# Patient Record
Sex: Male | Born: 1955 | Race: White | Hispanic: No | Smoking: Former smoker
Health system: Southern US, Community
[De-identification: ages and names within clinical notes are randomized; demographics above are authoritative.]

## PROBLEM LIST (undated history)

## (undated) DIAGNOSIS — N521 Erectile dysfunction due to diseases classified elsewhere: Secondary | ICD-10-CM

## (undated) DIAGNOSIS — I10 Essential (primary) hypertension: Secondary | ICD-10-CM

## (undated) DIAGNOSIS — C801 Malignant (primary) neoplasm, unspecified: Secondary | ICD-10-CM

## (undated) DIAGNOSIS — R310 Gross hematuria: Secondary | ICD-10-CM

## (undated) DIAGNOSIS — E1169 Type 2 diabetes mellitus with other specified complication: Secondary | ICD-10-CM

## (undated) DIAGNOSIS — E1142 Type 2 diabetes mellitus with diabetic polyneuropathy: Secondary | ICD-10-CM

## (undated) HISTORY — DX: Gross hematuria: R31.0

## (undated) HISTORY — PX: OTHER SURGICAL HISTORY: SHX169

## (undated) HISTORY — DX: Essential (primary) hypertension: I10

## (undated) HISTORY — DX: Type 2 diabetes mellitus with other specified complication: E11.69

## (undated) HISTORY — DX: Type 2 diabetes mellitus with diabetic polyneuropathy: E11.42

## (undated) HISTORY — DX: Erectile dysfunction due to diseases classified elsewhere: N52.1

---

## 2014-11-28 ENCOUNTER — Telehealth: Payer: Self-pay | Admitting: Family Medicine

## 2014-11-28 DIAGNOSIS — N521 Erectile dysfunction due to diseases classified elsewhere: Principal | ICD-10-CM

## 2014-11-28 DIAGNOSIS — E1169 Type 2 diabetes mellitus with other specified complication: Secondary | ICD-10-CM

## 2014-11-28 MED ORDER — SILDENAFIL CITRATE 20 MG PO TABS: 20.0000 mg | ORAL_TABLET | ORAL | Status: DC | PRN

## 2014-11-28 MED ORDER — LISINOPRIL 5 MG PO TABS: 5.0000 mg | ORAL_TABLET | Freq: Every day | ORAL | Status: DC

## 2014-11-28 NOTE — Telephone Encounter (Signed)
Pt called with update and a couple of questions.  He asked for your to call went you have a chance [phone]

## 2014-11-28 NOTE — Telephone Encounter (Signed)
Pt had question about his blood sugars. They have been running below 120 for the past few weeks. He has been taking only 4 units of lantus everyday. This AM he was in the 80's- no symptoms of hypoglycemia. He was also concerned about some low blood pressure readings- < 100/60.  He has been cutting it 1/2 - advised him to continue his 5mg  dose.  Call if his BP is less than 100/60. Call if BG is less than < 100 consistently.   Pt also wondering if he could have a prescription for viagra for his erectile dysfunction.

## 2015-06-11 ENCOUNTER — Encounter: Payer: Self-pay | Admitting: Family Medicine

## 2015-06-11 ENCOUNTER — Ambulatory Visit (INDEPENDENT_AMBULATORY_CARE_PROVIDER_SITE_OTHER): Payer: Self-pay | Admitting: Family Medicine

## 2015-06-11 VITALS — BP 119/84 | HR 90 | Temp 97.6°F | Resp 16 | Ht 75.0 in | Wt 257.8 lb

## 2015-06-11 DIAGNOSIS — I10 Essential (primary) hypertension: Secondary | ICD-10-CM

## 2015-06-11 DIAGNOSIS — E1142 Type 2 diabetes mellitus with diabetic polyneuropathy: Secondary | ICD-10-CM | POA: Insufficient documentation

## 2015-06-11 DIAGNOSIS — L089 Local infection of the skin and subcutaneous tissue, unspecified: Secondary | ICD-10-CM

## 2015-06-11 DIAGNOSIS — L723 Sebaceous cyst: Secondary | ICD-10-CM

## 2015-06-11 DIAGNOSIS — E119 Type 2 diabetes mellitus without complications: Secondary | ICD-10-CM | POA: Insufficient documentation

## 2015-06-11 HISTORY — DX: Essential (primary) hypertension: I10

## 2015-06-11 HISTORY — DX: Type 2 diabetes mellitus with diabetic polyneuropathy: E11.42

## 2015-06-11 LAB — POCT GLYCOSYLATED HEMOGLOBIN (HGB A1C): Hemoglobin A1C: 7.2

## 2015-06-11 MED ORDER — METFORMIN HCL 1000 MG PO TABS: 1000.0000 mg | ORAL_TABLET | Freq: Two times a day (BID) | ORAL | Status: DC

## 2015-06-11 MED ORDER — CEPHALEXIN 500 MG PO CAPS: 500.0000 mg | ORAL_CAPSULE | Freq: Two times a day (BID) | ORAL | Status: DC

## 2015-06-11 NOTE — Assessment & Plan Note (Addendum)
Great improvement in A1c. Increase metformin to 1000mg  BID for better control. ACE for renal protection. Foot exam done. Educated pt on need for eye exam- decline referral. RTC 3 mos.

## 2015-06-11 NOTE — Assessment & Plan Note (Signed)
Controlled. Continue current regimen. Check CMET.  ACE for renal protection.

## 2015-06-11 NOTE — Patient Instructions (Signed)
Please check your sugar 2-3 times per week. Scatter the times you take it- such as morning, 2 hours a meal, and bedtime.   Keep doing what you are doing. Your diabetes is under great control!   Take Keflex twice daily for the cyst on your back. Please consider having a surgeon look at your cyst.

## 2015-06-11 NOTE — Progress Notes (Signed)
Subjective:    [name], male    DOB: Aug 17, 1955, 60 y.o.   MRN: [id number]  HPI: Richard Oconnell is a 60 y.o. male presenting on 06/11/2015 for Hypertension   HPI  Pt presents for HTN and diabetes follow-up.  Last A1c was 13%- has lost significant weight. AM sugars 120-130.  Low of 98. Checks blood glucose in the AM. Eliminated sugar sweetened beverages. Switched to sweetener.  High protein, low carb diet. Lots of veggie. Whole wheat bread rarely. Exercise.   Overall doing well at home.  Pt has large sebaceous cyst on his back. It occasionally leaks and oily substance. Tender to palpation.   Past Medical History  Diagnosis Date  . Hypertension   . Diabetes mellitus without complication [facility])     Current Outpatient Prescriptions on File Prior to Visit  Medication Sig  . lisinopril (PRINIVIL,ZESTRIL) 5 MG tablet Take 1 tablet (5 mg total) by mouth daily.  . sildenafil (REVATIO) 20 MG tablet Take 1 tablet (20 mg total) by mouth as needed. Take 1-5 pills as needed for erectile dysfunction.   No current facility-administered medications on file prior to visit.    Review of Systems  Constitutional: Negative for fever and chills.  HENT: Negative.   Respiratory: Negative for chest tightness, shortness of breath and wheezing.   Cardiovascular: Negative for chest pain, palpitations and leg swelling.  Gastrointestinal: Negative for nausea, vomiting and abdominal pain.  Endocrine: Negative.   Genitourinary: Negative for dysuria, urgency, discharge, penile pain and testicular pain.  Musculoskeletal: Negative for back pain, joint swelling and arthralgias.  Skin: Positive for rash.  Neurological: Negative for dizziness, weakness, numbness and headaches.  Psychiatric/Behavioral: Negative for sleep disturbance and dysphoric mood.   Per HPI unless specifically indicated above     Objective:    BP 119/84 mmHg  Pulse 90  Temp(Src) 97.6 F (36.4 C) (Oral)  Resp 16  Ht 6'  3" (1.905 m)  Wt 257 lb 12.8 oz (116.937 kg)  BMI 32.22 kg/m2  Wt Readings from Last 3 Encounters:  06/11/15 257 lb 12.8 oz (116.937 kg)    Physical Exam  Constitutional: He is oriented to person, place, and time. He appears well-developed and well-nourished. No distress.  HENT:  Head: Normocephalic and atraumatic.  Neck: Neck supple. No thyromegaly present.  Cardiovascular: Normal rate, regular rhythm and normal heart sounds.  Exam reveals no gallop and no friction rub.   No murmur heard. Pulmonary/Chest: Effort normal and breath sounds normal. He has no wheezes.  Abdominal: Soft. Bowel sounds are normal. He exhibits no distension. There is no tenderness. There is no rebound.  Musculoskeletal: Normal range of motion. He exhibits no edema or tenderness.  Neurological: He is alert and oriented to person, place, and time. He has normal reflexes.  Skin: Skin is warm and dry. No rash noted. No erythema.  5cm in diameter sebaceous cyst in the upper back to the L side.   Psychiatric: He has a normal mood and affect. His behavior is normal. Thought content normal.   Results for orders placed or performed in visit on 06/11/15  POCT HgB A1C  Result Value Ref Range   Hemoglobin A1C 7.2       Assessment & Plan:   Problem List Items Addressed This Visit      Cardiovascular and Mediastinum   Hypertension    Controlled. Continue current regimen. Check CMET.  ACE for renal protection.       Relevant Orders  Comprehensive Metabolic Panel (CMET)     Endocrine   Type 2 diabetes mellitus with diabetic polyneuropathy, without long-term current use of insulin (HCC) - Primary    Great improvement in A1c. Increase metformin to 1000mg  BID for better control. ACE for renal protection. Foot exam done. Educated pt on need for eye exam- decline referral. RTC 3 mos.       Relevant Medications   metFORMIN (GLUCOPHAGE) 1000 MG tablet   Other Relevant Orders   POCT HgB A1C (Completed)   Lipid  Profile    Other Visit Diagnoses    Infected sebaceous cyst        Pt would benefit from I&D- declined in office today. Due to size- he would like a surgical consult for removal. Keflex to see if we can improve inflammation.     Relevant Medications    cephALEXin (KEFLEX) 500 MG capsule       Meds ordered this encounter  Medications  . DISCONTD: metFORMIN (GLUCOPHAGE) 500 MG tablet    Sig: Take 500 mg by mouth 2 (two) times daily with a meal.  . metFORMIN (GLUCOPHAGE) 1000 MG tablet    Sig: Take 1 tablet (1,000 mg total) by mouth 2 (two) times daily with a meal.    Dispense:  180 tablet    Refill:  3    Order Specific Question:  Supervising Provider    Answer:  Arlis Porta [phone]  . cephALEXin (KEFLEX) 500 MG capsule    Sig: Take 1 capsule (500 mg total) by mouth 2 (two) times daily.    Dispense:  14 capsule    Refill:  0    Order Specific Question:  Supervising Provider    Answer:  Arlis Porta [id number]      Follow up plan: Return in about 3 months (around 09/09/2015) for diabetes. Marland Kitchen

## 2016-05-06 ENCOUNTER — Telehealth: Payer: Self-pay | Admitting: Family Medicine

## 2016-05-06 MED ORDER — LISINOPRIL 5 MG PO TABS: 5.0000 mg | ORAL_TABLET | Freq: Every day | ORAL | 0 refills | Status: DC

## 2016-05-06 NOTE — Telephone Encounter (Signed)
Pt called requesting  A refill on lisinopil, once  Medication is  called in he have requested a phone call.  [phone]

## 2016-05-26 ENCOUNTER — Encounter: Payer: Self-pay | Admitting: Family Medicine

## 2016-05-26 ENCOUNTER — Ambulatory Visit (INDEPENDENT_AMBULATORY_CARE_PROVIDER_SITE_OTHER): Payer: Self-pay | Admitting: Family Medicine

## 2016-05-26 VITALS — BP 144/88 | HR 72 | Temp 98.1°F | Resp 16 | Ht 75.0 in | Wt 241.6 lb

## 2016-05-26 DIAGNOSIS — N521 Erectile dysfunction due to diseases classified elsewhere: Secondary | ICD-10-CM

## 2016-05-26 DIAGNOSIS — Z13220 Encounter for screening for lipoid disorders: Secondary | ICD-10-CM

## 2016-05-26 DIAGNOSIS — N529 Male erectile dysfunction, unspecified: Secondary | ICD-10-CM | POA: Insufficient documentation

## 2016-05-26 DIAGNOSIS — E1169 Type 2 diabetes mellitus with other specified complication: Secondary | ICD-10-CM

## 2016-05-26 DIAGNOSIS — I10 Essential (primary) hypertension: Secondary | ICD-10-CM

## 2016-05-26 DIAGNOSIS — E1142 Type 2 diabetes mellitus with diabetic polyneuropathy: Secondary | ICD-10-CM

## 2016-05-26 HISTORY — DX: Type 2 diabetes mellitus with other specified complication: E11.69

## 2016-05-26 LAB — POCT GLYCOSYLATED HEMOGLOBIN (HGB A1C): Hemoglobin A1C: 6.3

## 2016-05-26 MED ORDER — SILDENAFIL CITRATE 20 MG PO TABS: 20.0000 mg | ORAL_TABLET | ORAL | 2 refills | Status: DC | PRN

## 2016-05-26 MED ORDER — LISINOPRIL 10 MG PO TABS: 10.0000 mg | ORAL_TABLET | Freq: Every day | ORAL | 3 refills | Status: DC

## 2016-05-26 MED ORDER — METFORMIN HCL 1000 MG PO TABS: 1000.0000 mg | ORAL_TABLET | Freq: Two times a day (BID) | ORAL | 3 refills | Status: DC

## 2016-05-26 NOTE — Patient Instructions (Signed)
Thank you for coming in to clinic today.  1. Keep up the good work, A1c down from 7.2 to 6.3 - For now continue Metformin 1000mg  twice a day, in future we can try reducing this - If you continue to lose weight and work on diet and exercise regularly this will certainly help sugar and blood pressure  2. For BP - Check at home with your cuff, write down your readings, check this as needed not everyday, but different times of day, bring your printed record and also you can bring the BP cuff to calibrate it if you want - Double up on Lisinopril 5mg  x 2 daily for now, and then take new rx Lisinopril 10mg  daily, in future we may need to increase this dose as well if needed  If BP is elevated >140/90 persistently despite this change, notify our office and we can adjust medications sooner if needed  You will be due for FASTING BLOOD WORK (no food or drink after midnight before, only water or coffee without cream/sugar on the morning of)  - Please go ahead and schedule a "Lab Only" visit in the morning at the clinic for lab draw in 6 months - Make sure Lab Only appointment is at least 1-2 weeks before your next appointment, so that results will be available  For Lab Results, once available within 2-3 days of blood draw, you can can log in to MyChart online to view your results and a brief explanation. Also, we can discuss results at next follow-up visit.  Please schedule a follow-up appointment with Dr. Parks Ranger in 6 months for Annual Physical  If you have any other questions or concerns, please feel free to call the clinic or send a message through [facility]. You may also schedule an earlier appointment if necessary.  Nobie Putnam, DO [place]

## 2016-05-26 NOTE — Assessment & Plan Note (Signed)
Consistent with multifactorial ED with age, DM, HTN  Plan: 1. Refilled Sildenafil 20mg  tabs, taking 3-4 tabs PRN, given additional refills, printed, to take to [facility] 2. Follow-up as needed

## 2016-05-26 NOTE — Assessment & Plan Note (Addendum)
Well controlled, A1c 6.3 (last 7.2), has been well controlled >1 year now. Remains off insulin, only on Metformin and lifestyle changes  Plan: 1. Continue Metformin 1000mg  BID for now, given some recent limited lifestyle modifications and currently less exercise, reviewed future plan to taper down likely 500 BID in future if still remains A1c < 7 in 6 months, otherwise can consider Metformin XR once daily in future can possibly DC it 2. Improve regular exercise, DM diet, has goal for limited artificial sweetened soda 3. Continue ACEi 4. Not on ASA or Statin, awaiting future labs, briefly discussed ASCVD risk but patient hesitant about any new medications and does not like taking meds in general 5. Follow-up 6 months fasting labs, A1c, Annual physical

## 2016-05-26 NOTE — Progress Notes (Signed)
Subjective:    [name], male    DOB: 11/15/1955, 61 y.o.   MRN: [id number]  Richard Oconnell is a 61 y.o. male presenting on 05/26/2016 for Diabetes (highest 183 and lowest 120 )   HPI   CHRONIC DM, Type 2: Reports no concerns, anticipating he has been doing better, with weight loss over past year. Brief background information with new dx DM in May 2014, he had early A1c >13 at that time, now has done well, last A1c 7.2. CBGs: Avg 110-130, Low 82 (asymptomatic), High 183 (in past 4 months). Checks CBGs fasting in AM once every 1-2 weeks Meds: Metformin 1000mg  BID (increased to BID back about 2 year ago). No longer on lantus insulin this was only at very initial diagnosis with A1c >13 Reports mostly good compliance, occasionally will miss an evening dose. Tolerating well w/o side-effects Currently on ACEi Lifestyle: Diet (admits tries to follow DM diet with low carb, he has been off of sweet tea for few years now, limiting artifical sweetened soda, limited cereal ) / Exercise (recently tapered off exercise, but previously more sit ups, often sedentary at computer, no more running / jogging due to knees) - Weight loss about 16 lbs in 1 year with intentional weight loss, goal of 200 lb - DM neuropathy bilateral feet, some burning tingling, stable without progression, never on gabapentin, not interested Denies hypoglycemia, polyuria, visual changes, numbness or tingling.  CHRONIC HTN: Reports checks BP at home, avg 130s/80s, recently some high 130s-140. Usually higher at doctors office. Current Meds - Lisinopril 5mg  Reports good compliance, took med today. Tolerating well, w/o complaints. Denies CP, dyspnea, HA, edema, dizziness / lightheadedness  Erectile Dysfunction: - Reports chronic history of ED, he attributes some of it to the lisinopril, and does not like taking medications. However, does have DM and HTN. - Currently doing well on generic sildenafil, usually takes 3-4  tablets per use with good results. Request refill today - Admits to good sexual desire. Does get nocturnal erections.   Social History  Substance Use Topics  . Smoking status: Former Smoker    Types: Cigars  . Smokeless tobacco: Former Systems developer     Comment: once a while might have cigar may be once in 10 year  . Alcohol use No    Review of Systems Per HPI unless specifically indicated above     Objective:    BP (!) 144/88 (BP Location: Left Arm, Cuff Size: Normal)   Pulse 72   Temp 98.1 F (36.7 C) (Oral)   Resp 16   Ht 6\' 3"  (1.905 m)   Wt 241 lb 9.6 oz (109.6 kg)   BMI 30.20 kg/m   Wt Readings from Last 3 Encounters:  05/26/16 241 lb 9.6 oz (109.6 kg)  06/11/15 257 lb 12.8 oz (116.9 kg)    Physical Exam  Constitutional: He is oriented to person, place, and time. He appears well-developed and well-nourished. No distress.  Well-appearing, comfortable, cooperative  HENT:  Head: Normocephalic and atraumatic.  Mouth/Throat: Oropharynx is clear and moist.  Neck: Normal range of motion. Neck supple. No thyromegaly present.  No carotid bruits  Cardiovascular: Normal rate, regular rhythm, normal heart sounds and intact distal pulses.   No murmur heard. Pulmonary/Chest: Effort normal and breath sounds normal. No respiratory distress. He has no wheezes. He has no rales.  Musculoskeletal: He exhibits no edema.  Lymphadenopathy:    He has no cervical adenopathy.  Neurological: He is alert and oriented  to person, place, and time.  Skin: Skin is warm and dry. No rash noted. He is not diaphoretic.  Psychiatric: His behavior is normal.  Nursing note and vitals reviewed.    Results for orders placed or performed in visit on 05/26/16  POCT HgB A1C  Result Value Ref Range   Hemoglobin A1C 6.3       Assessment & Plan:   Problem List Items Addressed This Visit    Type 2 diabetes mellitus with diabetic polyneuropathy, without long-term current use of insulin ([place]) - Primary     Well controlled, A1c 6.3 (last 7.2), has been well controlled >1 year now. Remains off insulin, only on Metformin and lifestyle changes  Plan: 1. Continue Metformin 1000mg  BID for now, given some recent limited lifestyle modifications and currently less exercise, reviewed future plan to taper down likely 500 BID in future if still remains A1c < 7 in 6 months, otherwise can consider Metformin XR once daily in future can possibly DC it 2. Improve regular exercise, DM diet, has goal for limited artificial sweetened soda 3. Continue ACEi 4. Not on ASA or Statin, awaiting future labs, briefly discussed ASCVD risk but patient hesitant about any new medications and does not like taking meds in general 5. Follow-up 6 months fasting labs, A1c, Annual physical      Relevant Medications   metFORMIN (GLUCOPHAGE) 1000 MG tablet   lisinopril (PRINIVIL,ZESTRIL) 10 MG tablet   Other Relevant Orders   POCT HgB A1C (Completed)   Lipid panel   Hemoglobin A1c   Hypertension    Mildly elevated BP above goal, still on manual re-check. Patient admits some white coat hypertension, however home readings seem to be elevated as well by report.  Plan: 1. Increase Lisinopril to 10mg  daily, anticipate may need higher dose in future, initially only rx for renal protection 2. Encouraged to resume regular exercise, continue diet 3. Check BP outside office and bring readings to next visit 4. Follow-up 6 months, sooner if needed elevated BP, future fasting labs 6 mo      Relevant Medications   lisinopril (PRINIVIL,ZESTRIL) 10 MG tablet   sildenafil (REVATIO) 20 MG tablet   Other Relevant Orders   COMPLETE METABOLIC PANEL WITH GFR   Erectile dysfunction associated with type 2 diabetes mellitus ([place])    Consistent with multifactorial ED with age, DM, HTN  Plan: 1. Refilled Sildenafil 20mg  tabs, taking 3-4 tabs PRN, given additional refills, printed, to take to [facility] 2. Follow-up as needed       Relevant Medications   metFORMIN (GLUCOPHAGE) 1000 MG tablet   lisinopril (PRINIVIL,ZESTRIL) 10 MG tablet   sildenafil (REVATIO) 20 MG tablet    Other Visit Diagnoses    Screening cholesterol level       Relevant Orders   Lipid panel      Meds ordered this encounter  Medications  . metFORMIN (GLUCOPHAGE) 1000 MG tablet    Sig: Take 1 tablet (1,000 mg total) by mouth 2 (two) times daily with a meal.    Dispense:  180 tablet    Refill:  3  . lisinopril (PRINIVIL,ZESTRIL) 10 MG tablet    Sig: Take 1 tablet (10 mg total) by mouth daily.    Dispense:  90 tablet    Refill:  3  . sildenafil (REVATIO) 20 MG tablet    Sig: Take 1 tablet (20 mg total) by mouth as needed. Take 1-5 pills as needed for erectile dysfunction.    Dispense:  30 tablet    Refill:  2      Follow up plan: Return in about 6 months (around 11/23/2016) for blood pressure, diabetes.  Nobie Putnam, [facility] 05/26/2016, 10:52 PM

## 2016-05-26 NOTE — Assessment & Plan Note (Signed)
Mildly elevated BP above goal, still on manual re-check. Patient admits some white coat hypertension, however home readings seem to be elevated as well by report.  Plan: 1. Increase Lisinopril to 10mg  daily, anticipate may need higher dose in future, initially only rx for renal protection 2. Encouraged to resume regular exercise, continue diet 3. Check BP outside office and bring readings to next visit 4. Follow-up 6 months, sooner if needed elevated BP, future fasting labs 6 mo

## 2016-11-16 ENCOUNTER — Encounter: Payer: Self-pay | Admitting: Family Medicine

## 2016-11-16 ENCOUNTER — Ambulatory Visit (INDEPENDENT_AMBULATORY_CARE_PROVIDER_SITE_OTHER): Payer: Self-pay | Admitting: Family Medicine

## 2016-11-16 VITALS — BP 132/86 | HR 79 | Temp 98.3°F | Resp 16 | Ht 75.0 in | Wt 255.0 lb

## 2016-11-16 DIAGNOSIS — N3001 Acute cystitis with hematuria: Secondary | ICD-10-CM

## 2016-11-16 DIAGNOSIS — R300 Dysuria: Secondary | ICD-10-CM

## 2016-11-16 DIAGNOSIS — R310 Gross hematuria: Secondary | ICD-10-CM

## 2016-11-16 HISTORY — DX: Gross hematuria: R31.0

## 2016-11-16 LAB — POCT URINALYSIS DIPSTICK
Bilirubin, UA: NEGATIVE
Glucose, UA: NEGATIVE
Ketones, UA: NEGATIVE
Nitrite, UA: NEGATIVE
Protein, UA: 300
Spec Grav, UA: 1.015 (ref 1.010–1.025)
Urobilinogen, UA: 0.2 E.U./dL
pH, UA: 5 (ref 5.0–8.0)

## 2016-11-16 MED ORDER — CEPHALEXIN 500 MG PO CAPS: 500.0000 mg | ORAL_CAPSULE | Freq: Three times a day (TID) | ORAL | 0 refills | Status: DC

## 2016-11-16 NOTE — Patient Instructions (Addendum)
Thank you for coming to the clinic today.  1. 1. You have a Urinary Tract Infection - this is very common, your symptoms are reassuring and you should get better within 1 week on the antibiotics - Start Keflex 500mg  3 times daily for next 7 days, complete entire course, even if feeling better - We sent urine for a culture, we will call you within next few days if we need to change antibiotics - Please drink plenty of fluids, improve hydration over next 1 week  If symptoms worsening, developing nausea / vomiting, worsening back pain, fevers / chills / sweats, then please return for re-evaluation sooner.  To prevent bladder and kidney infections...  Drink enough water to keep your pee clear to pale yellow   Referral placed to Urology - stay tuned for appointment, if not heard back within 2 weeks, call them to check status, notify them about self pay and options  [facility] -[address],  [place]  [address] Phone: [phone]  Please schedule a Follow-up Appointment to: Return for as scheduled for physical.  If you have any other questions or concerns, please feel free to call the clinic or send a message through [place]. You may also schedule an earlier appointment if necessary.  Additionally, you may be receiving a survey about your experience at our clinic within a few days to 1 week by e-mail or mail. We value your feedback.  Nobie Putnam, DO [place]

## 2016-11-16 NOTE — Progress Notes (Signed)
Subjective:    [name], male    DOB: 05-Mar-1956, 61 y.o.   MRN: [id number]  Richard Oconnell is a 61 y.o. male presenting on 11/16/2016 for Hematuria (onset 2 week notice blood clot no pain no fever)  Patient presents for a same day appointment.  HPI   GROSS HEMATURIA: - Reports symptoms started 1-2 weeks ago onset urinary discomfort had pain vs burning and then felt like "blockage" and he passed a blood clot in urine. Has had some repeat episodes of blood passage in urine since then, describes continued dysuria or burning with voiding. - Admits to prior history of blood in urine 2-3 years ago, had x 2 episodes with color change in urine with "pink urine" without blood clot, this spontaneously resolve in past without treatment. He has never seen Urologist. - No prior history of kidney stone - History of ED was given prior rx sildenafil but has not tried using this since prescribed - Chart listed prior history of "former smoker" but he only smoked "one cigar in lifetime", never cigarettes - Denies any fever/chills, sweats, nausea, vomiting, abdominal pain, back or flank pain  Social History  Substance Use Topics  . Smoking status: Never Smoker  . Smokeless tobacco: Former Systems developer     Comment: once a while had one cigar >10 years ago  . Alcohol use No    Review of Systems Per HPI unless specifically indicated above     Objective:    BP 132/86   Pulse 79   Temp 98.3 F (36.8 C) (Oral)   Resp 16   Ht 6\' 3"  (1.905 m)   Wt 255 lb (115.7 kg)   BMI 31.87 kg/m   Wt Readings from Last 3 Encounters:  11/16/16 255 lb (115.7 kg)  05/26/16 241 lb 9.6 oz (109.6 kg)  06/11/15 257 lb 12.8 oz (116.9 kg)    Physical Exam  Constitutional: He is oriented to person, place, and time. He appears well-developed and well-nourished. No distress.  Well-appearing, comfortable, cooperative  HENT:  Head: Normocephalic and atraumatic.  Cardiovascular: Normal rate.   Pulmonary/Chest:  Effort normal.  Abdominal: Soft. He exhibits no distension. There is no tenderness.  Musculoskeletal: He exhibits no edema.  No back or flank tenderness on exam. Normal range of motion.  Neurological: He is alert and oriented to person, place, and time.  Skin: Skin is warm and dry. No rash noted. He is not diaphoretic. No erythema.  Nursing note and vitals reviewed.  Results for orders placed or performed in visit on 11/16/16  POCT Urinalysis Dipstick  Result Value Ref Range   Color, UA dark amber    Clarity, UA cloudy    Glucose, UA negative    Bilirubin, UA negative    Ketones, UA negative    Spec Grav, UA 1.015 1.010 - 1.025   Blood, UA large    pH, UA 5.0 5.0 - 8.0   Protein, UA 300    Urobilinogen, UA 0.2 0.2 or 1.0 E.U./dL   Nitrite, UA negative    Leukocytes, UA Trace (A) Negative      Assessment & Plan:   Problem List Items Addressed This Visit    Gross hematuria - Primary  Clinically with gross hematuria and blood clots also with prior history of similar episodes less severe 2-3 years ago, cannot rule out potential malignancy (kidney, bladder) however lower risk since never smoker. Most likely is UTI source, see below  Plan: 1. Treat empirically with  antibiotics for UTI and pending urine culture 2. Follow-up urine culture results 3. Referral to [facility] for evaluation of gross hematuria - discussion today that he would likely need further work-up with imaging, CT hematuria protocol, and cystoscopy given evidence of clots in urine and prior similar episodes. He understands risks, and despite self pay will contact [facility]    Relevant Orders   POCT Urinalysis Dipstick (Completed)   Ambulatory referral to Urology   Urine Culture    Other Visit Diagnoses    Dysuria       Acute cystitis with hematuria      Suspected UTI as cause of hematuria and dysuria Known DM2. No concern for pyelo today (no systemic symptoms, neg fever, back  pain, n/v).  Plan: 1. Start empiric Keflex 500mg  TID x 7 days 2. Check urine culture 3. Improve PO hydration 4. RTC if no improvement 1-2 weeks, red flags given to return sooner  Referral to urology    Relevant Medications   cephALEXin (KEFLEX) 500 MG capsule      Meds ordered this encounter  Medications  . cephALEXin (KEFLEX) 500 MG capsule    Sig: Take 1 capsule (500 mg total) by mouth 3 (three) times daily. For 7 days    Dispense:  21 capsule    Refill:  0      Follow up plan: Return for as scheduled for physical.  Nobie Putnam, DO [facility] 11/16/2016, 3:59 PM

## 2016-11-18 LAB — URINE CULTURE: Organism ID, Bacteria: NO GROWTH

## 2016-11-23 ENCOUNTER — Emergency Department
Admission: EM | Admit: 2016-11-23 | Discharge: 2016-11-23 | Disposition: A | Discharge status: Home / Self Care | Payer: Self-pay | Attending: Emergency Medicine | Admitting: Emergency Medicine

## 2016-11-23 ENCOUNTER — Emergency Department: Payer: Self-pay

## 2016-11-23 ENCOUNTER — Encounter: Payer: Self-pay | Admitting: Emergency Medicine

## 2016-11-23 DIAGNOSIS — C679 Malignant neoplasm of bladder, unspecified: Secondary | ICD-10-CM | POA: Insufficient documentation

## 2016-11-23 DIAGNOSIS — I10 Essential (primary) hypertension: Secondary | ICD-10-CM | POA: Insufficient documentation

## 2016-11-23 DIAGNOSIS — Z79899 Other long term (current) drug therapy: Secondary | ICD-10-CM | POA: Insufficient documentation

## 2016-11-23 DIAGNOSIS — Z7984 Long term (current) use of oral hypoglycemic drugs: Secondary | ICD-10-CM | POA: Insufficient documentation

## 2016-11-23 DIAGNOSIS — E119 Type 2 diabetes mellitus without complications: Secondary | ICD-10-CM | POA: Insufficient documentation

## 2016-11-23 DIAGNOSIS — R310 Gross hematuria: Secondary | ICD-10-CM | POA: Insufficient documentation

## 2016-11-23 DIAGNOSIS — Z87891 Personal history of nicotine dependence: Secondary | ICD-10-CM | POA: Insufficient documentation

## 2016-11-23 LAB — URINALYSIS, COMPLETE (UACMP) WITH MICROSCOPIC
Specific Gravity, Urine: 1.021 (ref 1.005–1.030)
Squamous Epithelial / LPF: NONE SEEN

## 2016-11-23 LAB — CBC
HCT: 42.4 % (ref 40.0–52.0)
Hemoglobin: 14.3 g/dL (ref 13.0–18.0)
MCH: 29.6 pg (ref 26.0–34.0)
MCHC: 33.7 g/dL (ref 32.0–36.0)
MCV: 87.8 fL (ref 80.0–100.0)
Platelets: 300 10*3/uL (ref 150–440)
RBC: 4.83 MIL/uL (ref 4.40–5.90)
RDW: 13.7 % (ref 11.5–14.5)
WBC: 8.9 10*3/uL (ref 3.8–10.6)

## 2016-11-23 LAB — BASIC METABOLIC PANEL
Anion gap: 8 (ref 5–15)
BUN: 22 mg/dL — ABNORMAL HIGH (ref 6–20)
CO2: 23 mmol/L (ref 22–32)
Calcium: 9.2 mg/dL (ref 8.9–10.3)
Chloride: 105 mmol/L (ref 101–111)
Creatinine, Ser: 0.95 mg/dL (ref 0.61–1.24)
GFR calc Af Amer: >60 mL/min (ref >60)
GFR calc non Af Amer: >60 mL/min (ref >60)
Glucose, Bld: 143 mg/dL — ABNORMAL HIGH (ref 65–99)
Potassium: 4.2 mmol/L (ref 3.5–5.1)
Sodium: 136 mmol/L (ref 135–145)

## 2016-11-23 NOTE — ED Triage Notes (Signed)
Patient to ER for c/o hematuria with blood clots. Patient states he believes he has had kidney stone, has had severe pain to penis (approx 2 inches from tip of penis). States he has passed several blood clots and had hematuria. Denies any history of kidney stones. States now urine stream is decreased as well, but still able to pass urine.

## 2016-11-23 NOTE — ED Notes (Signed)
Patient transported to CT 

## 2016-11-23 NOTE — Discharge Instructions (Addendum)
Please keep your appointment with urology in 2 days as scheduled or return to the emergency department sooner if you develop a fever chills or if you are unable to urinate.  It was a pleasure to take care of you today, and thank you for coming to our emergency department.  If you have any questions or concerns before leaving please ask the nurse to grab me and I'm more than happy to go through your aftercare instructions again.  If you were prescribed any opioid pain medication today such as Norco, Vicodin, Percocet, morphine, hydrocodone, or oxycodone please make sure you do not drive when you are taking this medication as it can alter your ability to drive safely.  If you have any concerns once you are home that you are not improving or are in fact getting worse before you can make it to your follow-up appointment, please do not hesitate to call 911 and come back for further evaluation.  Darel Hong MD  Results for orders placed or performed during the hospital encounter of 11/23/16  Urinalysis, Complete w Microscopic  Result Value Ref Range   Color, Urine RED (A) YELLOW   APPearance CLOUDY (A) CLEAR   Specific Gravity, Urine 1.021 1.005 - 1.030   pH  5.0 - 8.0    TEST NOT REPORTED DUE TO COLOR INTERFERENCE OF URINE PIGMENT   Glucose, UA (A) NEGATIVE mg/dL    TEST NOT REPORTED DUE TO COLOR INTERFERENCE OF URINE PIGMENT   Hgb urine dipstick (A) NEGATIVE    TEST NOT REPORTED DUE TO COLOR INTERFERENCE OF URINE PIGMENT   Bilirubin Urine (A) NEGATIVE    TEST NOT REPORTED DUE TO COLOR INTERFERENCE OF URINE PIGMENT   Ketones, ur (A) NEGATIVE mg/dL    TEST NOT REPORTED DUE TO COLOR INTERFERENCE OF URINE PIGMENT   Protein, ur (A) NEGATIVE mg/dL    TEST NOT REPORTED DUE TO COLOR INTERFERENCE OF URINE PIGMENT   Nitrite (A) NEGATIVE    TEST NOT REPORTED DUE TO COLOR INTERFERENCE OF URINE PIGMENT   Leukocytes, UA (A) NEGATIVE    TEST NOT REPORTED DUE TO COLOR INTERFERENCE OF URINE PIGMENT   RBC / HPF TOO NUMEROUS TO COUNT 0 - 5 RBC/hpf   WBC, UA 6-30 0 - 5 WBC/hpf   Bacteria, UA MANY (A) NONE SEEN   Squamous Epithelial / LPF NONE SEEN NONE SEEN   Mucous PRESENT    Amorphous Crystal PRESENT   Basic metabolic panel  Result Value Ref Range   Sodium 136 135 - 145 mmol/L   Potassium 4.2 3.5 - 5.1 mmol/L   Chloride 105 101 - 111 mmol/L   CO2 23 22 - 32 mmol/L   Glucose, Bld 143 (H) 65 - 99 mg/dL   BUN 22 (H) 6 - 20 mg/dL   Creatinine, Ser 0.95 0.61 - 1.24 mg/dL   Calcium 9.2 8.9 - 10.3 mg/dL   GFR calc non Af Amer >60 >60 mL/min   GFR calc Af Amer >60 >60 mL/min   Anion gap 8 5 - 15  CBC  Result Value Ref Range   WBC 8.9 3.8 - 10.6 K/uL   RBC 4.83 4.40 - 5.90 MIL/uL   Hemoglobin 14.3 13.0 - 18.0 g/dL   HCT 42.4 40.0 - 52.0 %   MCV 87.8 80.0 - 100.0 fL   MCH 29.6 26.0 - 34.0 pg   MCHC 33.7 32.0 - 36.0 g/dL   RDW 13.7 11.5 - 14.5 %   Platelets 300 150 -  440 K/uL   Ct Renal Stone Study  Result Date: 11/23/2016 CLINICAL DATA:  61 y/o  M; flank pain and hematuria. EXAM: CT ABDOMEN AND PELVIS WITHOUT CONTRAST TECHNIQUE: Multidetector CT imaging of the abdomen and pelvis was performed following the standard protocol without IV contrast. COMPARISON:  None. FINDINGS: Lower chest: No acute abnormality. Hepatobiliary: Subcentimeter lucency in segment 5 (series 2, image 39), likely a cyst. Cholelithiasis. No intra or extrahepatic biliary ductal dilatation. Pancreas: Unremarkable. No pancreatic ductal dilatation or surrounding inflammatory changes. Spleen: Normal in size without focal abnormality. Adrenals/Urinary Tract: Normal adrenal glands. 13 mm left kidney cyst (series 5, image 99). Mild nonspecific perinephric stranding. No urinary stone disease or hydronephrosis identified. High attenuation debris within the left hemi bladder, left lateral bladder diverticulum, and areas of soft tissue attenuating wall thickening along the left lateral bladder wall and posterior bladder wall  (series 2, image 76 and 80). Stomach/Bowel: Stomach is within normal limits. Appendix appears normal. No evidence of bowel wall thickening, distention, or inflammatory changes. Extensive sigmoid diverticulosis. Vascular/Lymphatic: Aortic atherosclerosis with moderate calcification. No enlarged abdominal or pelvic lymph nodes. Reproductive: Prostate hypertrophy. Other: Small inguinal hernias containing fat. Musculoskeletal: Multilevel degenerative changes of the thoracic and lumbar spine. No acute osseous abnormality identified. IMPRESSION: 1. Suboptimal assessment of soft tissues in the absence of intravenous contrast. 2. High attenuation material within the gallbladder likely representing hemorrhage. 3. Bladder wall thickening along the lateral and posterior walls and within a left bladder diverticulum may represent cystitis or urothelial neoplasm. Direct visualization is recommended. 4. No hydronephrosis or urinary stone disease identified. 5. Cholelithiasis. 6. Aortic atherosclerosis. Electronically Signed   By: Kristine Garbe M.D.   On: 11/23/2016 20:42

## 2016-11-23 NOTE — ED Provider Notes (Signed)
[facility] Emergency Department Provider Note  ____________________________________________   First MD Initiated Contact with Patient 11/23/16 2004     (approximate)  I have reviewed the triage vital signs and the nursing notes.   HISTORY  Chief Complaint Hematuria    HPI Richard Oconnell is a 61 y.o. male who comes to the emergency department with several days of worsening gross hematuria and passing blood clots from his penis. He has had intermittent episodes for about a month or so and is currently scheduled to see a urologist for the first time in 2 days. He never passed clots until today which concerned him so he came to the emergency department. He does report some intermittent mild right greater than left flank pain. No fevers or chills. No dysuria frequency or hesitancy. Nothing seems to make his pain better or worse.The pain is nonradiating.    Past Medical History:  Diagnosis Date  . Diabetes mellitus without complication [facility])     Patient Active Problem List   Diagnosis Date Noted  . Gross hematuria 11/16/2016  . Erectile dysfunction associated with type 2 diabetes mellitus ([place]) 05/26/2016  . Hypertension 06/11/2015  . Type 2 diabetes mellitus with diabetic polyneuropathy, without long-term current use of insulin ([place]) 06/11/2015    Past Surgical History:  Procedure Laterality Date  . boil lanceted     hand    Prior to Admission medications   Medication Sig Start Date End Date Taking? Authorizing Provider  cephALEXin (KEFLEX) 500 MG capsule Take 1 capsule (500 mg total) by mouth 3 (three) times daily. For 7 days 11/16/16   [name], DO  lisinopril (PRINIVIL,ZESTRIL) 10 MG tablet Take 1 tablet (10 mg total) by mouth daily. 05/26/16   Karamalegos, Devonne Doughty, DO  metFORMIN (GLUCOPHAGE) 1000 MG tablet Take 1 tablet (1,000 mg total) by mouth 2 (two) times daily with a meal. 05/26/16   Karamalegos, Devonne Doughty, DO  sildenafil  (REVATIO) 20 MG tablet Take 1 tablet (20 mg total) by mouth as needed. Take 1-5 pills as needed for erectile dysfunction. 05/26/16   [name], DO    Allergies Patient has no known allergies.  Family History  Problem Relation Age of Onset  . Heart attack Mother   . Parkinson's disease Father   . Heart attack Maternal Grandfather   . Heart disease Maternal Grandfather     Social History Social History  Substance Use Topics  . Smoking status: Never Smoker  . Smokeless tobacco: Former Systems developer     Comment: once a while had one cigar >10 years ago  . Alcohol use No    Review of Systems Constitutional: No fever/chills Eyes: No visual changes. ENT: No sore throat. Cardiovascular: Denies chest pain. Respiratory: Denies shortness of breath. Gastrointestinal: Positive abdominal pain.  No nausea, no vomiting.  No diarrhea.  No constipation. Genitourinary: Positive for hematuria Musculoskeletal: Negative for back pain. Skin: Negative for rash. Neurological: Negative for headaches, focal weakness or numbness.   ____________________________________________   PHYSICAL EXAM:  VITAL SIGNS: ED Triage Vitals  Enc Vitals Group     BP 11/23/16 1934 (!) 145/94     Pulse Rate 11/23/16 1934 93     Resp 11/23/16 1934 20     Temp 11/23/16 1934 98 F (36.7 C)     Temp Source 11/23/16 1934 Oral     SpO2 11/23/16 1934 97 %     Weight 11/23/16 1935 255 lb (115.7 kg)  Height 11/23/16 1935 6\' 3"  (1.905 m)     Head Circumference --      Peak Flow --      Pain Score 11/23/16 1934 10     Pain Loc --      Pain Edu? --      Excl. in [place]? --     Constitutional: Alert and oriented 4 very well-appearing nontoxic no diaphoresis speaks in full clear sentences Eyes: PERRL EOMI. Head: Atraumatic. Nose: No congestion/rhinnorhea. Mouth/Throat: No trismus Neck: No stridor.   Cardiovascular: Normal rate, regular rhythm. Grossly normal heart sounds.  Good peripheral  circulation. Respiratory: Normal respiratory effort.  No retractions. Lungs CTAB and moving good air Gastrointestinal: Soft nondistended nontender no rebound or guarding no peritonitis no McBurney's tenderness negative Rovsing's no costovertebral tenderness Musculoskeletal: No lower extremity edema   Neurologic:  Normal speech and language. No gross focal neurologic deficits are appreciated. Skin:  Skin is warm, dry and intact. No rash noted. Psychiatric: Mood and affect are normal. Speech and behavior are normal.    ____________________________________________   DIFFERENTIAL includes but not limited to  Renal colic, bladder cancer, renal cancer ____________________________________________   LABS (all labs ordered are listed, but only abnormal results are displayed)  Labs Reviewed  URINALYSIS, COMPLETE (UACMP) WITH MICROSCOPIC - Abnormal; Notable for the following:       Result Value   Color, Urine RED (*)    APPearance CLOUDY (*)    Glucose, UA   (*)    Value: TEST NOT REPORTED DUE TO COLOR INTERFERENCE OF URINE PIGMENT   Hgb urine dipstick   (*)    Value: TEST NOT REPORTED DUE TO COLOR INTERFERENCE OF URINE PIGMENT   Bilirubin Urine   (*)    Value: TEST NOT REPORTED DUE TO COLOR INTERFERENCE OF URINE PIGMENT   Ketones, ur   (*)    Value: TEST NOT REPORTED DUE TO COLOR INTERFERENCE OF URINE PIGMENT   Protein, ur   (*)    Value: TEST NOT REPORTED DUE TO COLOR INTERFERENCE OF URINE PIGMENT   Nitrite   (*)    Value: TEST NOT REPORTED DUE TO COLOR INTERFERENCE OF URINE PIGMENT   Leukocytes, UA   (*)    Value: TEST NOT REPORTED DUE TO COLOR INTERFERENCE OF URINE PIGMENT   Bacteria, UA MANY (*)    All other components within normal limits  BASIC METABOLIC PANEL - Abnormal; Notable for the following:    Glucose, Bld 143 (*)    BUN 22 (*)    All other components within normal limits  CBC    Normal renal function gross hematuria with no signs of  infection __________________________________________  EKG   ____________________________________________  RADIOLOGY  CT scan with no stones but a mass concerning for bladder cancer ____________________________________________   PROCEDURES  Procedure(s) performed: no  Procedures  Critical Care performed: no  Observation: no ____________________________________________   INITIAL IMPRESSION / ASSESSMENT AND PLAN / ED COURSE  Pertinent labs & imaging results that were available during my care of the patient were reviewed by me and considered in my medical decision making (see chart for details).  The patient arrives well-appearing and hemodynamically stable although with cloudy gross hematuria. I did not see any clots myself but he said he recently passed some. His symptoms are largely painless which raises the suspicion for malignancy. He artery has urology follow-up however will obtain a CT scan noncontrast today looking for acute surgical issues.  The patient's CT scan  shows no acute surgical issues but is concerning for bladder cancer. The patient has not had any episodes of obstruction. This point he is medically stable for outpatient management with urology follow-up in 2 days as scheduled.      ____________________________________________   FINAL CLINICAL IMPRESSION(S) / ED DIAGNOSES  Final diagnoses:  Gross hematuria  Malignant neoplasm of urinary bladder, unspecified site [facility])      NEW MEDICATIONS STARTED DURING THIS VISIT:  New Prescriptions   No medications on file     Note:  This document was prepared using Dragon voice recognition software and may include unintentional dictation errors.     Darel Hong, MD 11/23/16 2201

## 2016-11-23 NOTE — ED Notes (Signed)
Pt back from CT

## 2016-11-24 ENCOUNTER — Other Ambulatory Visit: Payer: Self-pay

## 2016-11-24 DIAGNOSIS — I10 Essential (primary) hypertension: Secondary | ICD-10-CM

## 2016-11-24 DIAGNOSIS — Z13220 Encounter for screening for lipoid disorders: Secondary | ICD-10-CM

## 2016-11-24 DIAGNOSIS — E1142 Type 2 diabetes mellitus with diabetic polyneuropathy: Secondary | ICD-10-CM

## 2016-11-25 ENCOUNTER — Ambulatory Visit (INDEPENDENT_AMBULATORY_CARE_PROVIDER_SITE_OTHER): Payer: Self-pay | Admitting: Urology

## 2016-11-25 ENCOUNTER — Telehealth: Payer: Self-pay | Admitting: Family Medicine

## 2016-11-25 ENCOUNTER — Encounter: Payer: Self-pay | Admitting: Urology

## 2016-11-25 VITALS — BP 133/81 | HR 90 | Ht 75.0 in | Wt 251.3 lb

## 2016-11-25 DIAGNOSIS — R310 Gross hematuria: Secondary | ICD-10-CM

## 2016-11-25 LAB — COMPLETE METABOLIC PANEL WITH GFR
ALT: 14 U/L (ref 9–46)
AST: 17 U/L (ref 10–35)
Albumin: 4.2 g/dL (ref 3.6–5.1)
Alkaline Phosphatase: 64 U/L (ref 40–115)
BUN: 19 mg/dL (ref 7–25)
CO2: 20 mmol/L (ref 20–31)
Calcium: 9.2 mg/dL (ref 8.6–10.3)
Chloride: 105 mmol/L (ref 98–110)
Creat: 1.03 mg/dL (ref 0.70–1.25)
GFR, Est African American: >89 mL/min (ref >=60)
GFR, Est Non African American: 78 mL/min (ref >=60)
Glucose, Bld: 107 mg/dL — ABNORMAL HIGH (ref 65–99)
Potassium: 4.3 mmol/L (ref 3.5–5.3)
Sodium: 138 mmol/L (ref 135–146)
Total Bilirubin: 0.5 mg/dL (ref 0.2–1.2)
Total Protein: 7 g/dL (ref 6.1–8.1)

## 2016-11-25 LAB — HEMOGLOBIN A1C
Hgb A1c MFr Bld: 6.4 % — ABNORMAL HIGH (ref <5.7)
Mean Plasma Glucose: 137 mg/dL

## 2016-11-25 LAB — LIPID PANEL
Cholesterol: 181 mg/dL (ref <200)
HDL: 31 mg/dL — ABNORMAL LOW (ref >40)
LDL Cholesterol: 99 mg/dL (ref <100)
Total CHOL/HDL Ratio: 5.8 Ratio — ABNORMAL HIGH (ref <5.0)
Triglycerides: 256 mg/dL — ABNORMAL HIGH (ref <150)
VLDL: 51 mg/dL — ABNORMAL HIGH (ref <30)

## 2016-11-25 NOTE — Progress Notes (Signed)
11/25/2016 1:27 PM   Richard Oconnell Aug 09, 1955 [id number]  Referring provider: [name], DO [address] [place],  [address]  Chief Complaint  Patient presents with  . Hematuria    HPI: The patient is a 61 year old gentleman who presents today after being seen in the emergency department for gross hematuria with clots. This has been intermittent problem for him for the last month. However, on questioning he states that he has seen it for short periods of time without clots for least the last 6 months. He felt that the clots are getting worse a few days ago which is why went to the ER in the first place. Currently endorses that his urine is bloody without clots. He has no previous genitourinary problems.  CT without contrast was performed in the emergency department which showed a lesion along the left lateral and posterior walls concerning for a bladder tumor. He also Evidence of exophytic lesions in his kidney particularly in the left mid kidney that were indeterminate due to lack of IV contrast (largest ~1 cm).   PMH: Past Medical History:  Diagnosis Date  . Diabetes mellitus without complication [facility])     Surgical History: Past Surgical History:  Procedure Laterality Date  . boil lanceted     hand    Home Medications:  Allergies as of 11/25/2016   No Known Allergies     Medication List       Accurate as of 11/25/16  1:27 PM. Always use your most recent med list.          cephALEXin 500 MG capsule Commonly known as:  KEFLEX Take 1 capsule (500 mg total) by mouth 3 (three) times daily. For 7 days   lisinopril 10 MG tablet Commonly known as:  PRINIVIL,ZESTRIL Take 1 tablet (10 mg total) by mouth daily.   metFORMIN 1000 MG tablet Commonly known as:  GLUCOPHAGE Take 1 tablet (1,000 mg total) by mouth 2 (two) times daily with a meal.   sildenafil 20 MG tablet Commonly known as:  REVATIO Take 1 tablet (20 mg total) by mouth as needed. Take 1-5  pills as needed for erectile dysfunction.       Allergies: No Known Allergies  Family History: Family History  Problem Relation Age of Onset  . Heart attack Mother   . Parkinson's disease Father   . Heart attack Maternal Grandfather   . Heart disease Maternal Grandfather   . Prostate cancer Neg Hx   . Bladder Cancer Neg Hx   . Kidney cancer Neg Hx     Social History:  reports that he has never smoked. He has quit using smokeless tobacco. He reports that he does not drink alcohol or use drugs.  ROS: UROLOGY Frequent Urination?: Yes Hard to postpone urination?: No Burning/pain with urination?: Yes Get up at night to urinate?: Yes Leakage of urine?: No Urine stream starts and stops?: Yes Trouble starting stream?: Yes Do you have to strain to urinate?: No Blood in urine?: Yes Urinary tract infection?: No Sexually transmitted disease?: No Injury to kidneys or bladder?: No Painful intercourse?: No Weak stream?: No Erection problems?: Yes Penile pain?: No  Gastrointestinal Nausea?: No Vomiting?: No Indigestion/heartburn?: No Diarrhea?: No Constipation?: No  Constitutional Fever: No Night sweats?: No Weight loss?: Yes Fatigue?: No  Skin Skin rash/lesions?: No Itching?: No  Eyes Blurred vision?: No Double vision?: No  Ears/Nose/Throat Sore throat?: No Sinus problems?: No  Hematologic/Lymphatic Swollen glands?: No Easy bruising?: No  Cardiovascular  Leg swelling?: No Chest pain?: No  Respiratory Cough?: No Shortness of breath?: No  Endocrine Excessive thirst?: No  Musculoskeletal Back pain?: No Joint pain?: No  Neurological Headaches?: No Dizziness?: No  Psychologic Depression?: No Anxiety?: No  Physical Exam: BP 133/81 (BP Location: Left Arm, Patient Position: Sitting, Cuff Size: Normal)   Pulse 90   Ht 6\' 3"  (1.905 m)   Wt 251 lb 4.8 oz (114 kg)   BMI 31.41 kg/m   Constitutional:  Alert and oriented, No acute distress. HEENT:  [place] AT, moist mucus membranes.  Trachea midline, no masses. Cardiovascular: No clubbing, cyanosis, or edema. Respiratory: Normal respiratory effort, no increased work of breathing. GI: Abdomen is soft, nontender, nondistended, no abdominal masses GU: No CVA tenderness.  Skin: No rashes, bruises or suspicious lesions. Lymph: No cervical or inguinal adenopathy. Neurologic: Grossly intact, no focal deficits, moving all 4 extremities. Psychiatric: Normal mood and affect.  Laboratory Data: Lab Results  Component Value Date   WBC 8.9 11/23/2016   HGB 14.3 11/23/2016   HCT 42.4 11/23/2016   MCV 87.8 11/23/2016   PLT 300 11/23/2016    Lab Results  Component Value Date   CREATININE 1.03 11/24/2016    No results found for: PSA  No results found for: TESTOSTERONE  Lab Results  Component Value Date   HGBA1C 6.4 (H) 11/24/2016    Urinalysis    Component Value Date/Time   COLORURINE RED (A) 11/23/2016 1935   APPEARANCEUR CLOUDY (A) 11/23/2016 1935   LABSPEC 1.021 11/23/2016 1935   PHURINE  11/23/2016 1935    TEST NOT REPORTED DUE TO COLOR INTERFERENCE OF URINE PIGMENT   GLUCOSEU (A) 11/23/2016 1935    TEST NOT REPORTED DUE TO COLOR INTERFERENCE OF URINE PIGMENT   HGBUR (A) 11/23/2016 1935    TEST NOT REPORTED DUE TO COLOR INTERFERENCE OF URINE PIGMENT   BILIRUBINUR (A) 11/23/2016 1935    TEST NOT REPORTED DUE TO COLOR INTERFERENCE OF URINE PIGMENT   BILIRUBINUR negative 11/16/2016 1354   KETONESUR (A) 11/23/2016 1935    TEST NOT REPORTED DUE TO COLOR INTERFERENCE OF URINE PIGMENT   PROTEINUR (A) 11/23/2016 1935    TEST NOT REPORTED DUE TO COLOR INTERFERENCE OF URINE PIGMENT   UROBILINOGEN 0.2 11/16/2016 1354   NITRITE (A) 11/23/2016 1935    TEST NOT REPORTED DUE TO COLOR INTERFERENCE OF URINE PIGMENT   LEUKOCYTESUR (A) 11/23/2016 1935    TEST NOT REPORTED DUE TO COLOR INTERFERENCE OF URINE PIGMENT    Pertinent Imaging: CT reviewed as described above   Assessment &  Plan:    1. Gross hematuria I discussed with the patient that his CT without contrast did show possible bladder lesions. He is very anxious about these. I discussed the patient the next step would be cystoscopy in the office to better visualize his bladder. If these are in fact tumors, they have likely been there for quite some time given his long history of intermittent gross hematuria. I also discussed with him that he did not have a full hematuria imaging that some point he benefit for better visualization of his kidneys though I think cystoscopy is more urgent from at this time. We'll arrange for him to undergo cystoscopy as soon as possible. He will likely need a TURBT pending these findings. He would benefit from retrograde pyelograms during this procedure due to the lack of contrast given on his CT that was ordered by the ED.  Return for cystoscopy ASAP.  Horald Pollen  Pilar Jarvis, [place] [address], [place], [place] [address] [phone]

## 2016-11-25 NOTE — Telephone Encounter (Signed)
The pt was notified of his lab results. No questions or concerns.

## 2016-11-25 NOTE — Telephone Encounter (Signed)
Pt asked to have another call back to explain labs [phone]

## 2016-11-29 ENCOUNTER — Other Ambulatory Visit: Payer: Self-pay | Admitting: Radiology

## 2016-11-29 ENCOUNTER — Ambulatory Visit: Payer: Self-pay | Admitting: Urology

## 2016-11-29 ENCOUNTER — Encounter: Payer: Self-pay | Admitting: Urology

## 2016-11-29 VITALS — BP 162/98 | HR 94 | Ht 75.0 in | Wt 250.0 lb

## 2016-11-29 DIAGNOSIS — R310 Gross hematuria: Secondary | ICD-10-CM

## 2016-11-29 LAB — URINALYSIS, COMPLETE
Bilirubin, UA: NEGATIVE
Glucose, UA: NEGATIVE
Ketones, UA: NEGATIVE
Nitrite, UA: NEGATIVE
Specific Gravity, UA: 1.01 (ref 1.005–1.030)
Urobilinogen, Ur: 1 mg/dL (ref 0.2–1.0)
pH, UA: 6 (ref 5.0–7.5)

## 2016-11-29 LAB — MICROSCOPIC EXAMINATION: RBC, UA: >30 /hpf — AB (ref 0–?)

## 2016-11-29 MED ORDER — LIDOCAINE HCL 2 % EX GEL
1.0000 "application " | Freq: Once | CUTANEOUS | Status: AC
  Administered 2016-11-29: 1 via URETHRAL

## 2016-11-29 MED ORDER — CIPROFLOXACIN HCL 500 MG PO TABS
500.0000 mg | ORAL_TABLET | Freq: Once | ORAL | Status: AC
  Administered 2016-11-29: 500 mg via ORAL

## 2016-11-29 NOTE — Addendum Note (Signed)
Addended by: Tommy Rainwater on: 11/29/2016 04:06 PM   Modules accepted: Orders

## 2016-11-29 NOTE — Progress Notes (Signed)
   11/29/16  CC:  Chief Complaint  Patient presents with  . Cysto    HPI:    Blood pressure (!) 162/98, pulse 94, height 6\' 3"  (1.905 m), weight 113.4 kg (250 lb). NED. A&Ox3.   No respiratory distress    Abd soft, NT, ND Normal phallus with bilateral descended testicles  Cystoscopy Procedure Note  Patient identification was confirmed, informed consent was obtained, and patient was prepped using Betadine solution.  Lidocaine jelly was administered per urethral meatus.    Preoperative abx where received prior to procedure.     Pre-Procedure: - Inspection reveals a normal caliber ureteral meatus.  Procedure: The flexible cystoscope was introduced without difficulty  - No urethral strictures/lesions are present.  - Enlarged prostate with lateral lobe obstruction - Normal bladder neck - Bilateral ureteral orifices identified - There was a diverticulum in the right lateral posterior aspect of the bladder that within the diverticulum was a papillary bladder lesion. There was also significant amount of clot which I was unable to aspirate removed. This made visualization very poor. However, he does have a 1-2 cm tumor at least in the diverticulum needs to be resected. - No bladder stones - No trabeculation  Retroflexion shows    Post-Procedure: - Patient tolerated the procedure well  Assessment/ Plan:  The cystoscopy was limited by poor visualization of the bladder mucosa given the large amount of clot. Several different techniques were used to try to aspirate the clot unsuccessfully. However, ultimately I was able to visualize a diverticulum with a small tumor within it. As such, we discussed TURBT, bilateral retrograde pyelograms and clot evacuation. I explained the significance of a tumor in a diverticulum, and how thin the resection might become. He understands this and the fact that he may require Foley catheter for 7 days following. I went over the risks and benefits of the  operation in significant detail and we'll try to get this scheduled ASAP.

## 2016-11-30 ENCOUNTER — Telehealth: Payer: Self-pay | Admitting: Radiology

## 2016-11-30 ENCOUNTER — Encounter: Payer: Self-pay | Admitting: Family Medicine

## 2016-11-30 ENCOUNTER — Ambulatory Visit (INDEPENDENT_AMBULATORY_CARE_PROVIDER_SITE_OTHER): Payer: Self-pay | Admitting: Family Medicine

## 2016-11-30 ENCOUNTER — Other Ambulatory Visit: Payer: Self-pay | Admitting: Radiology

## 2016-11-30 VITALS — BP 121/72 | HR 79 | Temp 98.1°F | Resp 16 | Ht 75.0 in | Wt 254.4 lb

## 2016-11-30 DIAGNOSIS — Z8551 Personal history of malignant neoplasm of bladder: Secondary | ICD-10-CM | POA: Insufficient documentation

## 2016-11-30 DIAGNOSIS — N3289 Other specified disorders of bladder: Secondary | ICD-10-CM

## 2016-11-30 DIAGNOSIS — E1142 Type 2 diabetes mellitus with diabetic polyneuropathy: Secondary | ICD-10-CM

## 2016-11-30 DIAGNOSIS — Z0000 Encounter for general adult medical examination without abnormal findings: Secondary | ICD-10-CM

## 2016-11-30 DIAGNOSIS — E785 Hyperlipidemia, unspecified: Secondary | ICD-10-CM

## 2016-11-30 DIAGNOSIS — E782 Mixed hyperlipidemia: Secondary | ICD-10-CM | POA: Insufficient documentation

## 2016-11-30 DIAGNOSIS — N323 Diverticulum of bladder: Secondary | ICD-10-CM | POA: Insufficient documentation

## 2016-11-30 DIAGNOSIS — E1169 Type 2 diabetes mellitus with other specified complication: Secondary | ICD-10-CM | POA: Insufficient documentation

## 2016-11-30 DIAGNOSIS — R310 Gross hematuria: Secondary | ICD-10-CM

## 2016-11-30 DIAGNOSIS — I10 Essential (primary) hypertension: Secondary | ICD-10-CM

## 2016-11-30 NOTE — Assessment & Plan Note (Signed)
Well-controlled DM with A1c 6.4 (stable) Complications - peripheral neuropathy  Plan:  1. Continue current therapy - Metformin 1000mg  BID - if lifestyle improve consider taper down 2. Encourage improved lifestyle - low carb, low sugar diet, reduce portion size, start regular exercise 3. Check CBG, bring log to next visit for review 4. Continue ACEi - discussed and offered ASA / Statin - due to elevated ASCVD risk, will defer for now pending bladder surgery 5. DM Foot exam done today / Advised to schedule DM ophtho exam, send record (however he declines due to cost at this time, self pay) 6. Follow-up 6 months A1c

## 2016-11-30 NOTE — Assessment & Plan Note (Signed)
Well-controlled HTN No known complications   Plan:  1. Continue current BP regimen Lisinopril 10mg  daily 2. Encourage improved lifestyle - low sodium diet, regular exercise 3. Start monitor BP outside office, bring readings to next visit, if persistently >140/90 or new symptoms notify office sooner 4. Follow-up 6 months

## 2016-11-30 NOTE — Assessment & Plan Note (Addendum)
-   Followed by [facility], now anticipating TURBT within 1 week

## 2016-11-30 NOTE — Assessment & Plan Note (Signed)
Uncontrolled cholesterol not on statin, poor lifestyle Last lipid panel 11/2016 Calculated ASCVD 10 yr risk score >26%  Plan: 1. Discussion on reducing ASCVD risk with ASA 81 and Statin - given upcoming surgery he prefers to hold any new start for now, especially ASA prior to procedure 2. Encourage improved lifestyle - low carb/cholesterol, reduce portion size, start regular exercise 3. Follow-up 6 months - anticipate no re-check lipids until 6-12 mo

## 2016-11-30 NOTE — Assessment & Plan Note (Signed)
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

## 2016-11-30 NOTE — Telephone Encounter (Signed)
Pt scheduled for TURBT with Dr Erlene Quan on 12/06/16. Made pt's sister aware of surgery, pre-admit testing appt & to call Friday prior to surgery for arrival time to SDS. Sister voices understanding.

## 2016-11-30 NOTE — Patient Instructions (Addendum)
Thank you for coming to the clinic today.  1. You are medically cleared for your upcoming surgery. No concerns prior to surgery.  2. Recommend in future for cholesterol and diabetes, to work on improving regular walking exercise  Consider taking a Statin cholesterol medication in future, such as Atorvastatin, Rosuvastatin, Crestor, Simvastatin, these medications will help lower cholesterol numbers LDL, TG, and primarily this will LOWER CARDIOVASCULAR RISK  Also, consider Aspirin in future for reducing cardiovascular risk. We would not recommend starting this immediately pre op or post op, and plan on this in future down the road in several months once healed.  Please schedule a Follow-up Appointment to: Return in about 6 months (around 06/02/2017) for Cholesterol med HLD, HTN, DM A1c.  If you have any other questions or concerns, please feel free to call the clinic or send a message through [place]. You may also schedule an earlier appointment if necessary.  Additionally, you may be receiving a survey about your experience at our clinic within a few days to 1 week by e-mail or mail. We value your feedback.  Nobie Putnam, DO [facility], [place] Diet A Mediterranean diet refers to food and lifestyle choices that are based on the traditions of countries located on the [place]. This way of eating has been shown to help prevent certain conditions and improve outcomes for people who have chronic diseases, like kidney disease and heart disease. What are tips for following this plan? Lifestyle  Cook and eat meals together with your family, when possible.  Drink enough fluid to keep your urine clear or pale yellow.  Be physically active every day. This includes: ? Aerobic exercise like running or swimming. ? Leisure activities like gardening, walking, or housework.  Get 7-8 hours of sleep each night.  If recommended by your health care  provider, drink red wine in moderation. This means 1 glass a day for nonpregnant women and 2 glasses a day for men. A glass of wine equals 5 oz (150 mL). Reading food labels  Check the serving size of packaged foods. For foods such as rice and pasta, the serving size refers to the amount of cooked product, not dry.  Check the total fat in packaged foods. Avoid foods that have saturated fat or trans fats.  Check the ingredients list for added sugars, such as corn syrup. Shopping  At the grocery store, buy most of your food from the areas near the walls of the store. This includes: ? Fresh fruits and vegetables (produce). ? Grains, beans, nuts, and seeds. Some of these may be available in unpackaged forms or large amounts (in bulk). ? Fresh seafood. ? Poultry and eggs. ? Low-fat dairy products.  Buy whole ingredients instead of prepackaged foods.  Buy fresh fruits and vegetables in-season from local farmers markets.  Buy frozen fruits and vegetables in resealable bags.  If you do not have access to quality fresh seafood, buy precooked frozen shrimp or canned fish, such as tuna, salmon, or sardines.  Buy small amounts of raw or cooked vegetables, salads, or olives from the deli or salad bar at your store.  Stock your pantry so you always have certain foods on hand, such as olive oil, canned tuna, canned tomatoes, rice, pasta, and beans. Cooking  Cook foods with extra-virgin olive oil instead of using butter or other vegetable oils.  Have meat as a side dish, and have vegetables or grains as your main dish. This means having  meat in small portions or adding small amounts of meat to foods like pasta or stew.  Use beans or vegetables instead of meat in common dishes like chili or lasagna.  Experiment with different cooking methods. Try roasting or broiling vegetables instead of steaming or sauteing them.  Add frozen vegetables to soups, stews, pasta, or rice.  Add nuts or seeds for  added healthy fat at each meal. You can add these to yogurt, salads, or vegetable dishes.  Marinate fish or vegetables using olive oil, lemon juice, garlic, and fresh herbs. Meal planning  Plan to eat 1 vegetarian meal one day each week. Try to work up to 2 vegetarian meals, if possible.  Eat seafood 2 or more times a week.  Have healthy snacks readily available, such as: ? Vegetable sticks with hummus. ? [place] yogurt. ? Fruit and nut trail mix.  Eat balanced meals throughout the week. This includes: ? Fruit: 2-3 servings a day ? Vegetables: 4-5 servings a day ? Low-fat dairy: 2 servings a day ? Fish, poultry, or lean meat: 1 serving a day ? Beans and legumes: 2 or more servings a week ? Nuts and seeds: 1-2 servings a day ? Whole grains: 6-8 servings a day ? Extra-virgin olive oil: 3-4 servings a day  Limit red meat and sweets to only a few servings a month What are my food choices?  Mediterranean diet ? Recommended ? Grains: Whole-grain pasta. Brown rice. Bulgar wheat. Polenta. Couscous. Whole-wheat bread. [place]. ? Vegetables: Artichokes. Beets. Broccoli. Cabbage. Carrots. Eggplant. Green beans. Chard. Kale. Spinach. Onions. Leeks. Peas. Squash. Tomatoes. Peppers. Radishes. ? Fruits: Apples. Apricots. Avocado. Berries. Bananas. Cherries. Dates. Figs. Grapes. Lemons. Melon. Oranges. Peaches. Plums. Pomegranate. ? Meats and other protein foods: Beans. Almonds. Sunflower seeds. Pine nuts. Peanuts. [place]. Salmon. Scallops. Shrimp. [place]. Tilapia. Clams. Oysters. Eggs. ? Dairy: Low-fat milk. Cheese. Greek yogurt. ? Beverages: Water. Red wine. Herbal tea. ? Fats and oils: Extra virgin olive oil. Avocado oil. Grape seed oil. ? Sweets and desserts: [place] yogurt with honey. Baked apples. Poached pears. Trail mix. ? Seasoning and other foods: Basil. Cilantro. Coriander. Cumin. Mint. Parsley. Sage. Rosemary. Tarragon. Garlic. Oregano. Thyme. Pepper. Balsalmic vinegar. Tahini.  Hummus. Tomato sauce. Olives. Mushrooms. ? Limit these ? Grains: Prepackaged pasta or rice dishes. Prepackaged cereal with added sugar. ? Vegetables: Deep fried potatoes (french fries). ? Fruits: Fruit canned in syrup. ? Meats and other protein foods: Beef. Pork. Lamb. Poultry with skin. Hot dogs. Berniece Salines. ? Dairy: Ice cream. Sour cream. Whole milk. ? Beverages: Juice. Sugar-sweetened soft drinks. Beer. Liquor and spirits. ? Fats and oils: Butter. Canola oil. Vegetable oil. Beef fat (tallow). Lard. ? Sweets and desserts: Cookies. Cakes. Pies. Candy. ? Seasoning and other foods: Mayonnaise. Premade sauces and marinades. ? The items listed may not be a complete list. Talk with your dietitian about what dietary choices are right for you. Summary  The Mediterranean diet includes both food and lifestyle choices.  Eat a variety of fresh fruits and vegetables, beans, nuts, seeds, and whole grains.  Limit the amount of red meat and sweets that you eat.  Talk with your health care provider about whether it is safe for you to drink red wine in moderation. This means 1 glass a day for nonpregnant women and 2 glasses a day for men. A glass of wine equals 5 oz (150 mL). This information is not intended to replace advice given to you by your health care provider. Make sure you  discuss any questions you have with your health care provider. Document Released: 12/25/2015 Document Revised: 01/27/2016 Document Reviewed: 12/25/2015 Elsevier Interactive Patient Education  2018 [employer].   -------------------------------  DASH Eating Plan DASH stands for "Dietary Approaches to Stop Hypertension." The DASH eating plan is a healthy eating plan that has been shown to reduce high blood pressure (hypertension). It may also reduce your risk for type 2 diabetes, heart disease, and stroke. The DASH eating plan may also help with weight loss. What are tips for following this plan? General guidelines  Avoid  eating more than 2,300 mg (milligrams) of salt (sodium) a day. If you have hypertension, you may need to reduce your sodium intake to 1,500 mg a day.  Limit alcohol intake to no more than 1 drink a day for nonpregnant women and 2 drinks a day for men. One drink equals 12 oz of beer, 5 oz of wine, or 1 oz of hard liquor.  Work with your health care provider to maintain a healthy body weight or to lose weight. Ask what an ideal weight is for you.  Get at least 30 minutes of exercise that causes your heart to beat faster (aerobic exercise) most days of the week. Activities may include walking, swimming, or biking.  Work with your health care provider or diet and nutrition specialist (dietitian) to adjust your eating plan to your individual calorie needs. Reading food labels  Check food labels for the amount of sodium per serving. Choose foods with less than 5 percent of the Daily Value of sodium. Generally, foods with less than 300 mg of sodium per serving fit into this eating plan.  To find whole grains, look for the word "whole" as the first word in the ingredient list. Shopping  Buy products labeled as "low-sodium" or "no salt added."  Buy fresh foods. Avoid canned foods and premade or frozen meals. Cooking  Avoid adding salt when cooking. Use salt-free seasonings or herbs instead of table salt or sea salt. Check with your health care provider or pharmacist before using salt substitutes.  Do not fry foods. Cook foods using healthy methods such as baking, boiling, grilling, and broiling instead.  Cook with heart-healthy oils, such as olive, canola, soybean, or sunflower oil. Meal planning   Eat a balanced diet that includes: ? 5 or more servings of fruits and vegetables each day. At each meal, try to fill half of your plate with fruits and vegetables. ? Up to 6-8 servings of whole grains each day. ? Less than 6 oz of lean meat, poultry, or fish each day. A 3-oz serving of meat is  about the same size as a deck of cards. One egg equals 1 oz. ? 2 servings of low-fat dairy each day. ? A serving of nuts, seeds, or beans 5 times each week. ? Heart-healthy fats. Healthy fats called Omega-3 fatty acids are found in foods such as flaxseeds and coldwater fish, like sardines, salmon, and mackerel.  Limit how much you eat of the following: ? Canned or prepackaged foods. ? Food that is high in trans fat, such as fried foods. ? Food that is high in saturated fat, such as fatty meat. ? Sweets, desserts, sugary drinks, and other foods with added sugar. ? Full-fat dairy products.  Do not salt foods before eating.  Try to eat at least 2 vegetarian meals each week.  Eat more home-cooked food and less restaurant, buffet, and fast food.  When eating at a restaurant, ask  that your food be prepared with less salt or no salt, if possible. What foods are recommended? The items listed may not be a complete list. Talk with your dietitian about what dietary choices are best for you. Grains Whole-grain or whole-wheat bread. Whole-grain or whole-wheat pasta. Brown rice. [place]. Bulgur. Whole-grain and low-sodium cereals. Pita bread. Low-fat, low-sodium crackers. Whole-wheat flour tortillas. Vegetables Fresh or frozen vegetables (raw, steamed, roasted, or grilled). Low-sodium or reduced-sodium tomato and vegetable juice. Low-sodium or reduced-sodium tomato sauce and tomato paste. Low-sodium or reduced-sodium canned vegetables. Fruits All fresh, dried, or frozen fruit. Canned fruit in natural juice (without added sugar). Meat and other protein foods Skinless chicken or [place]. Ground chicken or [place]. Pork with fat trimmed off. Fish and seafood. Egg whites. Dried beans, peas, or lentils. Unsalted nuts, nut butters, and seeds. Unsalted canned beans. Lean cuts of beef with fat trimmed off. Low-sodium, lean deli meat. Dairy Low-fat (1%) or fat-free (skim) milk. Fat-free, low-fat, or  reduced-fat cheeses. Nonfat, low-sodium ricotta or cottage cheese. Low-fat or nonfat yogurt. Low-fat, low-sodium cheese. Fats and oils Soft margarine without trans fats. Vegetable oil. Low-fat, reduced-fat, or light mayonnaise and salad dressings (reduced-sodium). Canola, safflower, olive, soybean, and sunflower oils. Avocado. Seasoning and other foods Herbs. Spices. Seasoning mixes without salt. Unsalted popcorn and pretzels. Fat-free sweets. What foods are not recommended? The items listed may not be a complete list. Talk with your dietitian about what dietary choices are best for you. Grains Baked goods made with fat, such as croissants, muffins, or some breads. Dry pasta or rice meal packs. Vegetables Creamed or fried vegetables. Vegetables in a cheese sauce. Regular canned vegetables (not low-sodium or reduced-sodium). Regular canned tomato sauce and paste (not low-sodium or reduced-sodium). Regular tomato and vegetable juice (not low-sodium or reduced-sodium). Angie Fava. Olives. Fruits Canned fruit in a light or heavy syrup. Fried fruit. Fruit in cream or butter sauce. Meat and other protein foods Fatty cuts of meat. Ribs. Fried meat. Bernie[place] Salines. Sausage. Bologna and other processed lunch meats. Salami. Fatback. Hotdogs. Bratwurst. Salted nuts and seeds. Canned beans with added salt. Canned or smoked fish. Whole eggs or egg yolks. Chicken or [place] with skin. Dairy Whole or 2% milk, cream, and half-and-half. Whole or full-fat cream cheese. Whole-fat or sweetened yogurt. Full-fat cheese. Nondairy creamers. Whipped toppings. Processed cheese and cheese spreads. Fats and oils Butter. Stick margarine. Lard. Shortening. Ghee. Bacon fat. Tropical oils, such as coconut, palm kernel, or palm oil. Seasoning and other foods Salted popcorn and pretzels. Onion salt, garlic salt, seasoned salt, table salt, and sea salt. Worcestershire sauce. Tartar sauce. Barbecue sauce. Teriyaki sauce. Soy sauce, including  reduced-sodium. Steak sauce. Canned and packaged gravies. Fish sauce. Oyster sauce. Cocktail sauce. Horseradish that you find on the shelf. Ketchup. Mustard. Meat flavorings and tenderizers. Bouillon cubes. Hot sauce and Tabasco sauce. Premade or packaged marinades. Premade or packaged taco seasonings. Relishes. Regular salad dressings. Where to find more information:  National Heart, Lung, and [place]: https://wilson-eaton.com/  American Heart Association: www.heart.org Summary  The DASH eating plan is a healthy eating plan that has been shown to reduce high blood pressure (hypertension). It may also reduce your risk for type 2 diabetes, heart disease, and stroke.  With the DASH eating plan, you should limit salt (sodium) intake to 2,300 mg a day. If you have hypertension, you may need to reduce your sodium intake to 1,500 mg a day.  When on the DASH eating plan, aim to eat more fresh fruits  and vegetables, whole grains, lean proteins, low-fat dairy, and heart-healthy fats.  Work with your health care provider or diet and nutrition specialist (dietitian) to adjust your eating plan to your individual calorie needs. This information is not intended to replace advice given to you by your health care provider. Make sure you discuss any questions you have with your health care provider. Document Released: 04/22/2011 Document Revised: 04/26/2016 Document Reviewed: 04/26/2016 Elsevier Interactive Patient Education  2017 [employer].

## 2016-11-30 NOTE — Assessment & Plan Note (Addendum)
-   Followed by [facility], now anticipating TURBT within 1 week for tissue diagnosis, and further management - Deferred further post-op recommendations to Urology, patient asking at this time, but do not have clear answers until surgery, tissue and further recs

## 2016-11-30 NOTE — Progress Notes (Signed)
Subjective:    Patient ID: Trevonne Oconnell, male    DOB: 1955-08-12, 61 y.o.   MRN: [id number]  Richard Oconnell is a 61 y.o. male presenting on 11/30/2016 for Annual Exam   HPI   Here for Annual Physical and review of recent lab results from 11/24/16, also recent labs from hospital ED.  HYPERLIPIDEMIA: - Last lipid panel 11/2016, abnormal with elevated TG >250, low HDL 30s, normal LDL 99 - Never on prior statin therapy - Not on ASA therapy for ASCVD risk reduction  CHRONIC DM, Type 2 with neuropathy Reports no concerns, improved A1c control. CBGs: no CBG readings today Meds: Metformin 1000mg  BID Reports good compliance. Tolerating well w/o side-effects Currently on ACEi Lifestyle: - Diet (admits needs to improve DM diet, asking for nutrition advice today, also for cholesterol improvement)  - Exercise (limited regular exercise, admits long history of car travel without opportunity for exercise) History of chronic neuropathy L>R foot Denies hypoglycemia  CHRONIC HTN: Reports no new concerns. Current Meds - Lisinopril 10mg  daily   Reports good compliance, took meds today. Tolerating well, w/o complaints.  GROSS HEMATURIA, secondary to bladder diverticulum with tumor: - Last visit with me 11/16/16, for same problem initial visit at that time with empiric therapy Keflex for possible UTI, urine culture (No Growth), and then referred more urgently to Urology for further work-up CT / Cystoscopy. Interval with patient presenting to ED on 7/10, for further evaluation and expedited work-up, had CT renal stone study, identified concern for possible bladder cancer, arranged with Urology outpatient, established with [facility], likely tumor present for while, next had cystoscopy on 7/16, long procedure, identified diverticulum with small tumor in it, with chronic blood clots, would require TURBT, bilateral retrograde pyelograms, anticipate surgery within 1 week likely 12/06/16,  general anesthesia - No concerns with anesthesia, previously he had general anesthesia before and tolerated well. - No known family history of cancer - He is "never smoker" only smoked 1 cigar  - Has strong family history of MI on maternal side of family, all family members >age 55  Past Medical History:  Diagnosis Date  . Diabetes mellitus without complication ([place])   . Erectile dysfunction associated with type 2 diabetes mellitus ([place]) 05/26/2016  . Gross hematuria 11/16/2016  . Hypertension 06/11/2015  . Type 2 diabetes mellitus with diabetic polyneuropathy, without long-term current use of insulin ([place]) 06/11/2015   Past Surgical History:  Procedure Laterality Date  . boil lanceted     hand   Social History   Social History  . Marital status: Divorced    Spouse name: N/A  . Number of children: N/A  . Years of education: N/A   Occupational History  . Not on file.   Social History Main Topics  . Smoking status: Never Smoker  . Smokeless tobacco: Former Systems developer     Comment: once a while had one cigar >10 years ago  . Alcohol use No  . Drug use: No  . Sexual activity: Not on file   Other Topics Concern  . Not on file   Social History Narrative  . No narrative on file   Family History  Problem Relation Age of Onset  . Heart attack Mother   . Heart disease Mother   . Parkinson's disease Father   . Heart attack Maternal Grandfather   . Heart disease Maternal Grandfather   . Heart attack Maternal Aunt   . Heart attack Maternal Grandmother   . Prostate cancer  Neg Hx   . Bladder Cancer Neg Hx   . Kidney cancer Neg Hx    Current Outpatient Prescriptions on File Prior to Visit  Medication Sig  . metFORMIN (GLUCOPHAGE) 1000 MG tablet Take 1 tablet (1,000 mg total) by mouth 2 (two) times daily with a meal.  . sildenafil (REVATIO) 20 MG tablet Take 1 tablet (20 mg total) by mouth as needed. Take 1-5 pills as needed for erectile dysfunction.  Marland Kitchen lisinopril (PRINIVIL,ZESTRIL)  10 MG tablet Take 1 tablet (10 mg total) by mouth daily. (Patient not taking: Reported on 11/30/2016)   No current facility-administered medications on file prior to visit.     Review of Systems  Constitutional: Negative for activity change, appetite change, chills, diaphoresis, fatigue, fever and unexpected weight change.  HENT: Negative for congestion, hearing loss and sinus pressure.   Eyes: Negative for visual disturbance.  Respiratory: Negative for apnea, cough, choking, chest tightness, shortness of breath and wheezing.   Cardiovascular: Negative for chest pain, palpitations and leg swelling.  Gastrointestinal: Negative for abdominal distention, abdominal pain, anal bleeding, blood in stool, constipation, diarrhea, nausea and vomiting.  Endocrine: Negative for cold intolerance.  Genitourinary: Positive for hematuria. Negative for difficulty urinating, dysuria and frequency.  Musculoskeletal: Negative for arthralgias, back pain and neck pain.  Skin: Negative for rash.  Allergic/Immunologic: Negative for environmental allergies.  Neurological: Negative for dizziness, weakness, light-headedness, numbness and headaches.  Hematological: Negative for adenopathy.  Psychiatric/Behavioral: Negative for behavioral problems, dysphoric mood and sleep disturbance. The patient is not nervous/anxious.    Per HPI unless specifically indicated above     Objective:    BP 121/72   Pulse 79   Temp 98.1 F (36.7 C) (Oral)   Resp 16   Ht 6\' 3"  (1.905 m)   Wt 254 lb 6.4 oz (115.4 kg)   BMI 31.80 kg/m   Wt Readings from Last 3 Encounters:  11/30/16 254 lb 6.4 oz (115.4 kg)  11/29/16 250 lb (113.4 kg)  11/25/16 251 lb 4.8 oz (114 kg)    Physical Exam  Constitutional: He is oriented to person, place, and time. He appears well-developed and well-nourished. No distress.  Well-appearing, comfortable, cooperative  HENT:  Head: Normocephalic and atraumatic.  Mouth/Throat: Oropharynx is clear and  moist.  Oropharynx clear without erythema, exudates, edema or asymmetry.  Eyes: Conjunctivae are normal. Right eye exhibits no discharge. Left eye exhibits no discharge.  Neck: Normal range of motion. Neck supple. No thyromegaly present.  Cardiovascular: Normal rate, regular rhythm, normal heart sounds and intact distal pulses.   No murmur heard. Pulmonary/Chest: Effort normal and breath sounds normal. No respiratory distress. He has no wheezes. He has no rales.  Abdominal: Soft. Bowel sounds are normal. He exhibits no distension and no mass. There is no tenderness.  Musculoskeletal: Normal range of motion. He exhibits no edema or tenderness.  Upper / Lower Extremities: - Normal muscle tone, strength bilateral upper extremities 5/5, lower extremities 5/5  Lymphadenopathy:    He has no cervical adenopathy.  Neurological: He is alert and oriented to person, place, and time.  Distal sensation intact to light touch all extremities  Skin: Skin is warm and dry. No rash noted. He is not diaphoretic. No erythema.  Psychiatric: He has a normal mood and affect. His behavior is normal.  Well groomed, good eye contact, normal speech and thoughts  Nursing note and vitals reviewed.    Diabetic Foot Exam - Simple   Simple Foot Form Diabetic Foot  exam was performed with the following findings:  Yes 11/30/2016  9:25 AM  Visual Inspection No deformities, no ulcerations, no other skin breakdown bilaterally:  Yes Sensation Testing See comments:  Yes Pulse Check Posterior Tibialis and Dorsalis pulse intact bilaterally:  Yes Comments Mild reduced sensation to monofilament testing L > R more toes and forefoot, still some intact, mid to hindfoot intact sensation.    Lipid Panel     Component Value Date/Time   CHOL 181 11/24/2016 0818   TRIG 256 (H) 11/24/2016 0818   HDL 31 (L) 11/24/2016 0818   CHOLHDL 5.8 (H) 11/24/2016 0818   VLDL 51 (H) 11/24/2016 0818   LDLCALC 99 11/24/2016 0818   CMP Latest  Ref Rng & Units 11/24/2016 11/23/2016  Glucose 65 - 99 mg/dL 107(H) 143(H)  BUN 7 - 25 mg/dL 19 22(H)  Creatinine 0.70 - 1.25 mg/dL 1.03 0.95  Sodium 135 - 146 mmol/L 138 136  Potassium 3.5 - 5.3 mmol/L 4.3 4.2  Chloride 98 - 110 mmol/L 105 105  CO2 20 - 31 mmol/L 20 23  Calcium 8.6 - 10.3 mg/dL 9.2 9.2  Total Protein 6.1 - 8.1 g/dL 7.0 -  Total Bilirubin 0.2 - 1.2 mg/dL 0.5 -  Alkaline Phos 40 - 115 U/L 64 -  AST 10 - 35 U/L 17 -  ALT 9 - 46 U/L 14 -    Recent Labs  05/26/16 1023 11/24/16 0818  HGBA1C 6.3 6.4*     Results for orders placed or performed in visit on 11/29/16  Microscopic Examination  Result Value Ref Range   WBC, UA 0-5 0 - 5 /hpf   RBC, UA >30 (A) 0 - 2 /hpf   Epithelial Cells (non renal) CANCELED   Urinalysis, Complete  Result Value Ref Range   Specific Gravity, UA 1.010 1.005 - 1.030   pH, UA 6.0 5.0 - 7.5   Color, UA Red (A) Yellow   Appearance Ur Cloudy (A) Clear   Leukocytes, UA Trace (A) Negative   Protein, UA 3+ (A) Negative/Trace   Glucose, UA Negative Negative   Ketones, UA Negative Negative   RBC, UA 3+ (A) Negative   Bilirubin, UA Negative Negative   Urobilinogen, Ur 1.0 0.2 - 1.0 mg/dL   Nitrite, UA Negative Negative   Microscopic Examination See below:       Assessment & Plan:   Problem List Items Addressed This Visit    Type 2 diabetes mellitus with diabetic polyneuropathy, without long-term current use of insulin (HCC)    Well-controlled DM with A1c 6.4 (stable) Complications - peripheral neuropathy  Plan:  1. Continue current therapy - Metformin 1000mg  BID - if lifestyle improve consider taper down 2. Encourage improved lifestyle - low carb, low sugar diet, reduce portion size, start regular exercise 3. Check CBG, bring log to next visit for review 4. Continue ACEi - discussed and offered ASA / Statin - due to elevated ASCVD risk, will defer for now pending bladder surgery 5. DM Foot exam done today / Advised to schedule DM  ophtho exam, send record (however he declines due to cost at this time, self pay) 6. Follow-up 6 months A1c      Hypertension    Well-controlled HTN No known complications   Plan:  1. Continue current BP regimen Lisinopril 10mg  daily 2. Encourage improved lifestyle - low sodium diet, regular exercise 3. Start monitor BP outside office, bring readings to next visit, if persistently >140/90 or new symptoms notify office sooner  4. Follow-up 6 months      Hyperlipidemia associated with type 2 diabetes mellitus ([place])    Uncontrolled cholesterol not on statin, poor lifestyle Last lipid panel 11/2016 Calculated ASCVD 10 yr risk score >26%  Plan: 1. Discussion on reducing ASCVD risk with ASA 81 and Statin - given upcoming surgery he prefers to hold any new start for now, especially ASA prior to procedure 2. Encourage improved lifestyle - low carb/cholesterol, reduce portion size, start regular exercise 3. 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 - Followed by [facility], now anticipating TURBT within 1 week for tissue diagnosis, and further management      Bladder mass    - Followed by [facility], now anticipating TURBT within 1 week for tissue diagnosis, and further management - Deferred further post-op recommendations to Urology, patient asking at this time, but do not have clear answers until surgery, tissue and further recs      Bladder diverticulum    - Followed by [facility], now anticipating TURBT within 1 week       Other Visit Diagnoses    Annual physical exam    -  Primary      No orders of the defined types were placed in this encounter.    Follow up plan: Return in about 6 months (around 06/02/2017) for Cholesterol med HLD, HTN, DM A1c.  Nobie Putnam, [facility] 11/30/2016, 11:11 PM

## 2016-12-02 ENCOUNTER — Encounter
Admission: RE | Admit: 2016-12-02 | Discharge: 2016-12-02 | Disposition: A | Discharge status: Home / Self Care | Payer: Self-pay | Source: Ambulatory Visit | Attending: Urology | Admitting: Urology

## 2016-12-02 DIAGNOSIS — Z01810 Encounter for preprocedural cardiovascular examination: Secondary | ICD-10-CM | POA: Insufficient documentation

## 2016-12-02 DIAGNOSIS — E119 Type 2 diabetes mellitus without complications: Secondary | ICD-10-CM | POA: Insufficient documentation

## 2016-12-02 LAB — CULTURE, URINE COMPREHENSIVE

## 2016-12-02 NOTE — Patient Instructions (Signed)
  Your procedure is scheduled on: Mon. 12/06/16 Report to Day Surgery. To find out your arrival time please call [phone] between 1PM - 3PM on Friday 12/03/16  Remember: Instructions that are not followed completely may result in serious medical risk, up to and including death, or upon the discretion of your surgeon and anesthesiologist your surgery may need to be rescheduled.    _x___ 1. Do not eat food or drink liquids after midnight. No gum chewing or hard candies.     __x_ 2. No Alcohol for 24 hours before or after surgery.   ____ 3. Do Not Smoke For 24 Hours Prior to Your Surgery.   ____ 4. Bring all medications with you on the day of surgery if instructed.    __x__ 5. Notify your doctor if there is any change in your medical condition     (cold, fever, infections).       Do not wear jewelry, make-up, hairpins, clips or nail polish.  Do not wear lotions, powders, or perfumes. You may wear deodorant.  Do not shave 48 hours prior to surgery. Men may shave face and neck.  Do not bring valuables to the hospital.    [facility] is not responsible for any belongings or valuables.               Contacts, dentures or bridgework may not be worn into surgery.  Leave your suitcase in the car. After surgery it may be brought to your room.  For patients admitted to the hospital, discharge time is determined by your                treatment team.   Patients discharged the day of surgery will not be allowed to drive home.   Please read over the following fact sheets that you were given:      ____ Take these medicines the morning of surgery with A SIP OF WATER:    1.none  2.   3.   4.  5.  6.  ____ Fleet Enema (as directed)   ____ Use CHG Soap as directed  ____ Use inhalers on the day of surgery  _x___ Stop metformin 2 days prior to surgery 7/21    ____ Take 1/2 of usual insulin dose the night before surgery and none on the morning of surgery.   ____ Stop  Coumadin/Plavix/aspirin on   __x__ Stop Anti-inflammatories on tylenol only   ____ Stop supplements until after surgery.    ____ Bring C-Pap to the hospital.

## 2016-12-05 MED ORDER — CEFAZOLIN SODIUM-DEXTROSE 2-4 GM/100ML-% IV SOLN
2.0000 g | INTRAVENOUS | Status: AC
  Administered 2016-12-06: 2 g via INTRAVENOUS

## 2016-12-06 ENCOUNTER — Ambulatory Visit
Admission: RE | Admit: 2016-12-06 | Discharge: 2016-12-06 | Disposition: A | Discharge status: Home / Self Care | Payer: Self-pay | Source: Ambulatory Visit | Attending: Urology | Admitting: Urology

## 2016-12-06 ENCOUNTER — Ambulatory Visit: Payer: Self-pay | Admitting: Anesthesiology

## 2016-12-06 ENCOUNTER — Encounter
Admission: RE | Disposition: A | Discharge status: Home / Self Care | Payer: Self-pay | Source: Ambulatory Visit | Attending: Urology

## 2016-12-06 ENCOUNTER — Encounter: Payer: Self-pay | Admitting: *Deleted

## 2016-12-06 DIAGNOSIS — I10 Essential (primary) hypertension: Secondary | ICD-10-CM | POA: Insufficient documentation

## 2016-12-06 DIAGNOSIS — E119 Type 2 diabetes mellitus without complications: Secondary | ICD-10-CM | POA: Insufficient documentation

## 2016-12-06 DIAGNOSIS — D494 Neoplasm of unspecified behavior of bladder: Secondary | ICD-10-CM

## 2016-12-06 DIAGNOSIS — C672 Malignant neoplasm of lateral wall of bladder: Secondary | ICD-10-CM | POA: Insufficient documentation

## 2016-12-06 DIAGNOSIS — R310 Gross hematuria: Secondary | ICD-10-CM

## 2016-12-06 DIAGNOSIS — N3289 Other specified disorders of bladder: Secondary | ICD-10-CM | POA: Insufficient documentation

## 2016-12-06 DIAGNOSIS — N323 Diverticulum of bladder: Secondary | ICD-10-CM | POA: Insufficient documentation

## 2016-12-06 DIAGNOSIS — Z79899 Other long term (current) drug therapy: Secondary | ICD-10-CM | POA: Insufficient documentation

## 2016-12-06 DIAGNOSIS — Z7984 Long term (current) use of oral hypoglycemic drugs: Secondary | ICD-10-CM | POA: Insufficient documentation

## 2016-12-06 HISTORY — PX: TRANSURETHRAL RESECTION OF BLADDER TUMOR: SHX2575

## 2016-12-06 HISTORY — PX: CYSTOSCOPY W/ RETROGRADES: SHX1426

## 2016-12-06 LAB — GLUCOSE, CAPILLARY
Glucose-Capillary: 147 mg/dL — ABNORMAL HIGH (ref 65–99)
Glucose-Capillary: 144 mg/dL — ABNORMAL HIGH (ref 65–99)

## 2016-12-06 SURGERY — TURBT (TRANSURETHRAL RESECTION OF BLADDER TUMOR)
Anesthesia: General | Site: Ureter | Wound class: Clean Contaminated

## 2016-12-06 MED ORDER — SODIUM CHLORIDE 0.9 % IV SOLN
INTRAVENOUS | Status: DC
  Administered 2016-12-06: 12:00:00 via INTRAVENOUS

## 2016-12-06 MED ORDER — FENTANYL CITRATE (PF) 100 MCG/2ML IJ SOLN
INTRAMUSCULAR | Status: AC
  Filled 2016-12-06: qty 2

## 2016-12-06 MED ORDER — OXYBUTYNIN CHLORIDE 5 MG PO TABS
5.0000 mg | ORAL_TABLET | Freq: Three times a day (TID) | ORAL | Status: DC | PRN
  Administered 2016-12-06: 5 mg via ORAL

## 2016-12-06 MED ORDER — ONDANSETRON HCL 4 MG/2ML IJ SOLN
INTRAMUSCULAR | Status: AC
  Filled 2016-12-06: qty 2

## 2016-12-06 MED ORDER — CEFAZOLIN SODIUM-DEXTROSE 2-4 GM/100ML-% IV SOLN
INTRAVENOUS | Status: AC
  Filled 2016-12-06: qty 100

## 2016-12-06 MED ORDER — ROCURONIUM BROMIDE 50 MG/5ML IV SOLN
INTRAVENOUS | Status: AC
  Filled 2016-12-06: qty 1

## 2016-12-06 MED ORDER — OXYBUTYNIN CHLORIDE 5 MG PO TABS: 5.0000 mg | ORAL_TABLET | Freq: Three times a day (TID) | ORAL | 0 refills | Status: DC | PRN

## 2016-12-06 MED ORDER — LIDOCAINE HCL (PF) 2 % IJ SOLN
INTRAMUSCULAR | Status: AC
  Filled 2016-12-06: qty 2

## 2016-12-06 MED ORDER — OXYBUTYNIN CHLORIDE 5 MG PO TABS
ORAL_TABLET | ORAL | Status: AC
  Filled 2016-12-06: qty 1

## 2016-12-06 MED ORDER — MIDAZOLAM HCL 2 MG/2ML IJ SOLN
INTRAMUSCULAR | Status: DC | PRN
  Administered 2016-12-06: 2 mg via INTRAVENOUS

## 2016-12-06 MED ORDER — HYDROCODONE-ACETAMINOPHEN 5-325 MG PO TABS
ORAL_TABLET | ORAL | Status: DC
Start: 2016-12-06 — End: 2016-12-06
  Filled 2016-12-06: qty 1

## 2016-12-06 MED ORDER — HYDROCODONE-ACETAMINOPHEN 5-325 MG PO TABS
1.0000 | ORAL_TABLET | Freq: Four times a day (QID) | ORAL | Status: DC | PRN
  Administered 2016-12-06: 1 via ORAL

## 2016-12-06 MED ORDER — LIDOCAINE HCL (CARDIAC) 20 MG/ML IV SOLN
INTRAVENOUS | Status: DC | PRN
  Administered 2016-12-06: 100 mg via INTRAVENOUS

## 2016-12-06 MED ORDER — PROPOFOL 10 MG/ML IV BOLUS
INTRAVENOUS | Status: AC
  Filled 2016-12-06: qty 20

## 2016-12-06 MED ORDER — FENTANYL CITRATE (PF) 100 MCG/2ML IJ SOLN
INTRAMUSCULAR | Status: AC
  Administered 2016-12-06: 25 ug via INTRAVENOUS
  Filled 2016-12-06: qty 2

## 2016-12-06 MED ORDER — DOCUSATE SODIUM 100 MG PO CAPS: 100.0000 mg | ORAL_CAPSULE | Freq: Two times a day (BID) | ORAL | 0 refills | Status: DC | PRN

## 2016-12-06 MED ORDER — DEXAMETHASONE SODIUM PHOSPHATE 10 MG/ML IJ SOLN
INTRAMUSCULAR | Status: AC
  Filled 2016-12-06: qty 1

## 2016-12-06 MED ORDER — ONDANSETRON HCL 4 MG/2ML IJ SOLN: 4.0000 mg | Freq: Once | INTRAMUSCULAR | Status: DC | PRN

## 2016-12-06 MED ORDER — FENTANYL CITRATE (PF) 100 MCG/2ML IJ SOLN
INTRAMUSCULAR | Status: DC | PRN
  Administered 2016-12-06 (×4): 50 ug via INTRAVENOUS

## 2016-12-06 MED ORDER — ONDANSETRON HCL 4 MG/2ML IJ SOLN
INTRAMUSCULAR | Status: DC | PRN
  Administered 2016-12-06: 4 mg via INTRAVENOUS

## 2016-12-06 MED ORDER — PHENYLEPHRINE HCL 10 MG/ML IJ SOLN
INTRAMUSCULAR | Status: DC | PRN
  Administered 2016-12-06 (×3): 100 ug via INTRAVENOUS

## 2016-12-06 MED ORDER — PROPOFOL 10 MG/ML IV BOLUS
INTRAVENOUS | Status: DC | PRN
  Administered 2016-12-06: 200 mg via INTRAVENOUS

## 2016-12-06 MED ORDER — SUGAMMADEX SODIUM 200 MG/2ML IV SOLN
INTRAVENOUS | Status: AC
  Filled 2016-12-06: qty 2

## 2016-12-06 MED ORDER — FENTANYL CITRATE (PF) 100 MCG/2ML IJ SOLN
25.0000 ug | INTRAMUSCULAR | Status: AC | PRN
  Administered 2016-12-06 (×6): 25 ug via INTRAVENOUS

## 2016-12-06 MED ORDER — HYDROCODONE-ACETAMINOPHEN 5-325 MG PO TABS: 1.0000 | ORAL_TABLET | Freq: Four times a day (QID) | ORAL | 0 refills | Status: DC | PRN

## 2016-12-06 MED ORDER — MIDAZOLAM HCL 2 MG/2ML IJ SOLN
INTRAMUSCULAR | Status: AC
  Filled 2016-12-06: qty 2

## 2016-12-06 MED ORDER — DEXAMETHASONE SODIUM PHOSPHATE 10 MG/ML IJ SOLN
INTRAMUSCULAR | Status: DC | PRN
  Administered 2016-12-06: 4 mg via INTRAVENOUS

## 2016-12-06 MED ORDER — IOTHALAMATE MEGLUMINE 43 % IV SOLN
INTRAVENOUS | Status: DC | PRN
  Administered 2016-12-06: 20 mL via URETHRAL

## 2016-12-06 SURGICAL SUPPLY — 36 items
BAG DRAIN CYSTO-URO LG1000N (MISCELLANEOUS) ×3 IMPLANT
BAG URINE DRAINAGE (UROLOGICAL SUPPLIES) ×3 IMPLANT
BAG URO DRAIN 2000ML W/SPOUT (MISCELLANEOUS) IMPLANT
CATH FOL 2WAY LX 20X30 (CATHETERS) ×3 IMPLANT
CATH FOL LEG HOLDER (MISCELLANEOUS) ×3 IMPLANT
CATH FOLEY 2WAY  5CC 16FR (CATHETERS)
CATH URETL 5X70 OPEN END (CATHETERS) ×3 IMPLANT
CATH URTH 16FR FL 2W BLN LF (CATHETERS) IMPLANT
CONRAY 43 FOR UROLOGY 50M (MISCELLANEOUS) ×3 IMPLANT
DRAPE UTILITY 15X26 TOWEL STRL (DRAPES) ×3 IMPLANT
DRSG TELFA 4X3 1S NADH ST (GAUZE/BANDAGES/DRESSINGS) ×3 IMPLANT
ELECT LOOP 22F BIPOLAR SML (ELECTROSURGICAL)
ELECT REM PT RETURN 9FT ADLT (ELECTROSURGICAL) ×3
ELECTRODE LOOP 22F BIPOLAR SML (ELECTROSURGICAL) IMPLANT
ELECTRODE REM PT RTRN 9FT ADLT (ELECTROSURGICAL) ×2 IMPLANT
GLOVE BIO SURGEON STRL SZ 6.5 (GLOVE) ×6 IMPLANT
GOWN STRL REUS W/ TWL LRG LVL3 (GOWN DISPOSABLE) ×4 IMPLANT
GOWN STRL REUS W/TWL LRG LVL3 (GOWN DISPOSABLE) ×2
KIT RM TURNOVER CYSTO AR (KITS) ×3 IMPLANT
LOOP CUT BIPOLAR 24F LRG (ELECTROSURGICAL) IMPLANT
NDL SAFETY ECLIPSE 18X1.5 (NEEDLE) IMPLANT
NEEDLE HYPO 18GX1.5 SHARP (NEEDLE)
PACK CYSTO AR (MISCELLANEOUS) ×3 IMPLANT
SCRUB POVIDONE IODINE 4 OZ (MISCELLANEOUS) ×3 IMPLANT
SENSORWIRE 0.038 NOT ANGLED (WIRE) ×3
SET CYSTO W/LG BORE CLAMP LF (SET/KITS/TRAYS/PACK) ×3 IMPLANT
SET IRRIG Y TYPE TUR BLADDER L (SET/KITS/TRAYS/PACK) IMPLANT
SET IRRIGATING DISP (SET/KITS/TRAYS/PACK) ×3 IMPLANT
SOL .9 NS 3000ML IRR  AL (IV SOLUTION)
SOL .9 NS 3000ML IRR UROMATIC (IV SOLUTION) IMPLANT
SURGILUBE 2OZ TUBE FLIPTOP (MISCELLANEOUS) ×3 IMPLANT
SYR 10ML LL (SYRINGE) ×3 IMPLANT
SYRINGE IRR TOOMEY STRL 70CC (SYRINGE) ×3 IMPLANT
WATER STERILE IRR 1000ML POUR (IV SOLUTION) ×9 IMPLANT
WATER STERILE IRR 3000ML UROMA (IV SOLUTION) ×3 IMPLANT
WIRE SENSOR 0.038 NOT ANGLED (WIRE) ×2 IMPLANT

## 2016-12-06 NOTE — Discharge Instructions (Signed)
Indwelling Urinary Catheter Care, Adult  Take good care of your catheter to keep it working and to prevent problems.  How to wear your catheter  Attach your catheter to your leg with tape (adhesive tape) or a leg strap. Make sure it is not too tight. If you use tape, remove any bits of tape that are already on the catheter.  How to wear a drainage bag  You should have:   A large overnight bag.   A small leg bag.    Overnight Bag  You may wear the overnight bag at any time. Always keep the bag below the level of your bladder but off the floor. When you sleep, put a clean plastic bag in a wastebasket. Then hang the bag inside the wastebasket.  Leg Bag  Never wear the leg bag at night. Always wear the leg bag below your knee. Keep the leg bag secure with a leg strap or tape.  How to care for your skin   Clean the skin around the catheter at least once every day.   Shower every day. Do not take baths.   Put creams, lotions, or ointments on your genital area only as told by your doctor.   Do not use powders, sprays, or lotions on your genital area.  How to clean your catheter and your skin  1. Wash your hands with soap and water.  2. Wet a washcloth in warm water and gentle (mild) soap.  3. Use the washcloth to clean the skin where the catheter enters your body. Clean downward and wipe away from the catheter in small circles. Do not wipe toward the catheter.  4. Pat the area dry with a clean towel. Make sure to clean off all soap.  How to care for your drainage bags  Empty your drainage bag when it is ?- full or at least 2-3 times a day. Replace your drainage bag once a month or sooner if it starts to smell bad or look dirty. Do not clean your drainage bag unless told by your doctor.  Emptying a drainage bag    Supplies Needed   Rubbing alcohol.   Gauze pad or cotton ball.   Tape or a leg strap.    Steps  1. Wash your hands with soap and water.  2. Separate (detach) the bag from your leg.  3. Hold the bag over  the toilet or a clean container. Keep the bag below your hips and bladder. This stops pee (urine) from going back into the tube.  4. Open the pour spout at the bottom of the bag.  5. Empty the pee into the toilet or container. Do not let the pour spout touch any surface.  6. Put rubbing alcohol on a gauze pad or cotton ball.  7. Use the gauze pad or cotton ball to clean the pour spout.  8. Close the pour spout.  9. Attach the bag to your leg with tape or a leg strap.  10. Wash your hands.    Changing a drainage bag  Supplies Needed   Alcohol wipes.   A clean drainage bag.   Adhesive tape or a leg strap.    Steps  1. Wash your hands with soap and water.  2. Separate the dirty bag from your leg.  3. Pinch the rubber catheter with your fingers so that pee does not spill out.  4. Separate the catheter tube from the drainage tube where these tubes connect (at the   connection valve). Do not let the tubes touch any surface.  5. Clean the end of the catheter tube with an alcohol wipe. Use a different alcohol wipe to clean the end of the drainage tube.  6. Connect the catheter tube to the drainage tube of the clean bag.  7. Attach the new bag to the leg with adhesive tape or a leg strap.  8. Wash your hands.    How to prevent infection and other problems   Never pull on your catheter or try to remove it. Pulling can damage tissue in your body.   Always wash your hands before and after touching your catheter.   If a leg strap gets wet, replace it with a dry one.   Drink enough fluids to keep your pee clear or pale yellow, or as told by your doctor.   Do not let the drainage bag or tubing touch the floor.   Wear cotton underwear.   If you are male, wipe from front to back after you poop (have a bowel movement).   Check on the catheter often to make sure it works and the tubing is not twisted.  Get help if:   Your pee is cloudy.   Your pee smells unusually bad.   Your pee is not draining into the bag.   Your  tube gets clogged.   Your catheter starts to leak.   Your bladder feels full.  Get help right away if:   You have redness, swelling, or pain where the catheter enters your body.   You have fluid, pus, or a bad smell coming from the area where the catheter enters your body.   The area where the catheter enters your body feels warm.   You have a fever.   You have pain in your:  ? Stomach (abdomen).  ? Legs.  ? Lower back.  ? Bladder.   You see blood fill the catheter.   Your pee is pink or red.   You feel sick to your stomach (nauseous).   You throw up (vomit).   You have chills.   Your catheter gets pulled out.  This information is not intended to replace advice given to you by your health care provider. Make sure you discuss any questions you have with your health care provider.  Document Released: 08/28/2012 Document Revised: 03/31/2016 Document Reviewed: 10/16/2013  Elsevier Interactive Patient Education  2018 [employer]

## 2016-12-06 NOTE — Anesthesia Preprocedure Evaluation (Addendum)
Anesthesia Evaluation  Patient identified by MRN, date of birth, ID band Patient awake    Reviewed: Allergy & Precautions, NPO status , Patient's Chart, lab work & pertinent test results  Airway Mallampati: II  TM Distance: >3 FB     Dental  (+) Chipped, Caps   Pulmonary neg pulmonary ROS,    Pulmonary exam normal        Cardiovascular hypertension, Pt. on medications Normal cardiovascular exam     Neuro/Psych  Neuromuscular disease negative psych ROS   GI/Hepatic negative GI ROS, Neg liver ROS,   Endo/Other  diabetes  Renal/GU      Musculoskeletal   Abdominal Normal abdominal exam  (+)   Peds negative pediatric ROS (+)  Hematology   Anesthesia Other Findings Past Medical History: No date: Diabetes mellitus without complication ([place]) 06/06/9756: Erectile dysfunction associated with type 2 diabetes  mellitus ([place]) 11/16/2016: Gross hematuria 06/11/2015: Hypertension 06/11/2015: Type 2 diabetes mellitus with diabetic polyneuropathy,  without long-term current use of insulin (HCC)  Reproductive/Obstetrics                            Anesthesia Physical Anesthesia Plan  ASA: II  Anesthesia Plan: General   Post-op Pain Management:    Induction: Intravenous  PONV Risk Score and Plan: 3 and Ondansetron, Dexamethasone, Propofol and Midazolam  Airway Management Planned: Oral ETT and LMA  Additional Equipment:   Intra-op Plan:   Post-operative Plan: Extubation in OR  Informed Consent: I have reviewed the patients History and Physical, chart, labs and discussed the procedure including the risks, benefits and alternatives for the proposed anesthesia with the patient or authorized representative who has indicated his/her understanding and acceptance.   Dental advisory given  Plan Discussed with: CRNA and Surgeon  Anesthesia Plan Comments:         Anesthesia Quick Evaluation

## 2016-12-06 NOTE — H&P (View-Only) (Signed)
   11/29/16  CC:  Chief Complaint  Patient presents with  . Cysto    HPI:    Blood pressure (!) 162/98, pulse 94, height 6\' 3"  (1.905 m), weight 113.4 kg (250 lb). NED. A&Ox3.   No respiratory distress    Abd soft, NT, ND Normal phallus with bilateral descended testicles  Cystoscopy Procedure Note  Patient identification was confirmed, informed consent was obtained, and patient was prepped using Betadine solution.  Lidocaine jelly was administered per urethral meatus.    Preoperative abx where received prior to procedure.     Pre-Procedure: - Inspection reveals a normal caliber ureteral meatus.  Procedure: The flexible cystoscope was introduced without difficulty  - No urethral strictures/lesions are present.  - Enlarged prostate with lateral lobe obstruction - Normal bladder neck - Bilateral ureteral orifices identified - There was a diverticulum in the right lateral posterior aspect of the bladder that within the diverticulum was a papillary bladder lesion. There was also significant amount of clot which I was unable to aspirate removed. This made visualization very poor. However, he does have a 1-2 cm tumor at least in the diverticulum needs to be resected. - No bladder stones - No trabeculation  Retroflexion shows    Post-Procedure: - Patient tolerated the procedure well  Assessment/ Plan:  The cystoscopy was limited by poor visualization of the bladder mucosa given the large amount of clot. Several different techniques were used to try to aspirate the clot unsuccessfully. However, ultimately I was able to visualize a diverticulum with a small tumor within it. As such, we discussed TURBT, bilateral retrograde pyelograms and clot evacuation. I explained the significance of a tumor in a diverticulum, and how thin the resection might become. He understands this and the fact that he may require Foley catheter for 7 days following. I went over the risks and benefits of the  operation in significant detail and we'll try to get this scheduled ASAP.

## 2016-12-06 NOTE — Anesthesia Procedure Notes (Signed)
Procedure Name: LMA Insertion Date/Time: 12/06/2016 1:31 PM Performed by: Darlyne Russian Pre-anesthesia Checklist: Patient identified, Emergency Drugs available, Suction available, Patient being monitored and Timeout performed Patient Re-evaluated:Patient Re-evaluated prior to induction Oxygen Delivery Method: Circle system utilized Preoxygenation: Pre-oxygenation with 100% oxygen Induction Type: IV induction LMA: LMA inserted LMA Size: 5.0 Number of attempts: 1 Tube secured with: Tape Dental Injury: Teeth and Oropharynx as per pre-operative assessment

## 2016-12-06 NOTE — Transfer of Care (Signed)
Immediate Anesthesia Transfer of Care Note  [name]  Procedure(s) Performed: Procedure(s): TRANSURETHRAL RESECTION OF BLADDER TUMOR (TURBT) (2-5cm) CLOT EVACUATION (N/A) CYSTOSCOPY WITH RETROGRADE PYELOGRAM (Bilateral)  Patient Location: PACU  Anesthesia Type:General  Level of Consciousness: awake, alert , oriented and patient cooperative  Airway & Oxygen Therapy: Patient Spontanous Breathing and Patient connected to nasal cannula oxygen  Post-op Assessment: Post -op Vital signs reviewed and stable  Post vital signs: Reviewed and stable  Last Vitals:  Vitals:   12/06/16 1450 12/06/16 1455  BP: 123/79   Pulse: 84 78  Resp: 11 16  Temp: 36.7 C     Last Pain:  Vitals:   12/06/16 1450  TempSrc: Tympanic  PainSc: 6       Patients Stated Pain Goal: 2 (50/38/88 2800)  Complications: No apparent anesthesia complications

## 2016-12-06 NOTE — Anesthesia Post-op Follow-up Note (Cosign Needed)
Anesthesia QCDR form completed.        

## 2016-12-06 NOTE — Op Note (Signed)
Date of procedure: 12/06/16  Preoperative diagnosis:  1. Gross hematuria 2. Bladder tumor 3. bladder diverticulum   Postoperative diagnosis:  1. same   Procedure: 1. TURBT, medium 2. Bilateral retrograde pyelogram 3. Clot evacuation  Surgeon: Hollice Espy, MD  Anesthesia: General  Complications: None  Intraoperative findings: 2 discrete bladder tumors, one measuring approximately 2.5 cm on the left lateral wall of bladder just superior to bladder diverticulum with significant calcification and neovascularity. There is also approximately 1.5 cm tumor papillary in nature within a narrow mouth diverticulum just adjacent to this. Velvety bladder erythema inferior to the bladder diverticulum highly suspicious for CIS.  EBL: Minimal  Specimens: Bladder tumor, bladder diverticulum tumor, bladder erythema  Drains: 20 French two-way Foley catheter with 20 cc in the balloon  Indication: Richard Oconnell is a 61 y.o. patient with with gross hematuria found to have multiple bladder tumors including a bladder tumor within the bladder diverticulum.  After reviewing the management options for treatment, he elected to proceed with the above surgical procedure(s). We have discussed the potential benefits and risks of the procedure, side effects of the proposed treatment, the likelihood of the patient achieving the goals of the procedure, and any potential problems that might occur during the procedure or recuperation. Informed consent has been obtained.  Description of procedure:  The patient was taken to the operating room and general anesthesia was induced.  The patient was placed in the dorsal lithotomy position, prepped and draped in the usual sterile fashion, and preoperative antibiotics were administered. A preoperative time-out was performed.   A 21 French cystoscope was advanced per urethra into the bladder. Upon inspecting the bladder, visualization was very poor due to well-formed  clot within the bladder. A Toomey syringe was used to evacuate approximately 50 cc of old organized clot.   After clearance of the clot, visualization was improved. The bladder was mildly trabeculated. Bilateral ureteral orifices were in normal anatomic position with reflux of clear yellow urine from both.  On the posterior lateral wall, a necrotic tumor which was mostly papillary in nature was seen measuring approximately 2.5 cm. There was significant neovascularity and pulsation posterior to this tumor. Additionally, small bladder diverticulum just inferior to this was identified barely able to accommodate the beak of the 21 French cystoscope. Within the small bladder diverticulum, there was a well circumscribed papillary tumor on a relatively broad stalk. Finally, inferior to the opening of the mouth of the diverticulum, there was a large patch of discrete velvety erythema highly concerning for CIS. This measured greater than 2 cm in its widest diameter.  First, attention was turned to the right ureteral orifice which was cannulated using a 5 [place] open-ended ureteral catheter. A gentle retrograde pyelogram was performed on this side which revealed no filling defects or hydroureteronephrosis. The same exact procedure was performed in the left side which was also free of any filling defects or hydroureteronephrosis.  Next, cold cup biopsy forceps were used to take several samples of the patchy erythema suspicious for CIS. This was passed off the field as bladder erythema. The base of each of these lesions was fulgurated using Bugbee electrocautery. All additional suspicious mucosa within this patch was fulgurated for therapeutic purpose. Next, local cup biopsy forceps were used to piecewise enucleate the large 2.5 cm necrotic tumor. He was on a relatively broad stalk. The tumor was resected down to level of the muscularis propria which could be easily seen. There is no active residual tumor, Bugbee  electrocautery was used to for grade the base of this lesion as well. Finally, I was able to insert the beak of my scope within the narrow mouth diverticulum. Very judicious and carefully, I removed this tumor as well the piecewise fashion. Care was taken to take small bites given the thin-walled diverticulum. His abdomen was constantly checked throughout this procedure to ensure that that there was no clinical perforation. The base of the lesion was then judiciously cauterized using Bugbee electrocautery. Upon reinspection of the bladder at the end of the procedure, there was no active residual viable tumor noted. Hemostasis was excellent. The bladder was then drained. Given resection within a diverticulum, the decision was made to leave a Foley catheter for at least 72 hours. A 20 French two-way Foley catheter with a 20 cc balloon was inserted.  The patient was then cleaned and dried and repositioned the supine position. He was reversed from anesthesia and taken to the PACU in stable condition.  Plan:  Findings were discussed extensively with the patient's sister. He will follow up on Friday for a voiding trial and review of pathology if available at that time.  Hollice Espy, M.D.

## 2016-12-06 NOTE — Interval H&P Note (Signed)
History and Physical Interval Note:  12/06/2016 1:03 PM  Richard Oconnell  has presented today for surgery, with the diagnosis of bladder mass  The various methods of treatment have been discussed with the patient and family. After consideration of risks, benefits and other options for treatment, the patient has consented to  Procedure(s): TRANSURETHRAL RESECTION OF BLADDER TUMOR (TURBT) (2-5cm) CLOT EVACUATION (N/A) CYSTOSCOPY WITH RETROGRADE PYELOGRAM (Bilateral) as a surgical intervention .  The patient's history has been reviewed, patient examined, no change in status, stable for surgery.  I have reviewed the patient's chart and labs.  Questions were answered to the patient's satisfaction.    RRR CTAB  Hollice Espy

## 2016-12-07 ENCOUNTER — Encounter: Payer: Self-pay | Admitting: Urology

## 2016-12-07 NOTE — Anesthesia Postprocedure Evaluation (Signed)
Anesthesia Post Note  Patient: Noor Vidales  Procedure(s) Performed: Procedure(s) (LRB): TRANSURETHRAL RESECTION OF BLADDER TUMOR (TURBT) (2-5cm) CLOT EVACUATION (N/A) CYSTOSCOPY WITH RETROGRADE PYELOGRAM (Bilateral)  Patient location during evaluation: PACU Anesthesia Type: General Level of consciousness: awake and alert Pain management: pain level controlled Vital Signs Assessment: post-procedure vital signs reviewed and stable Respiratory status: spontaneous breathing, nonlabored ventilation, respiratory function stable and patient connected to nasal cannula oxygen Cardiovascular status: blood pressure returned to baseline and stable Postop Assessment: no signs of nausea or vomiting Anesthetic complications: no     Last Vitals:  Vitals:   12/06/16 1553 12/06/16 1638  BP: 123/84 134/74  Pulse: 68 62  Resp: 16 18  Temp: (!) 36.3 C     Last Pain:  Vitals:   12/07/16 1047  TempSrc:   PainSc: 2                  Martha Clan

## 2016-12-08 LAB — SURGICAL PATHOLOGY

## 2016-12-10 ENCOUNTER — Encounter: Payer: Self-pay | Admitting: Urology

## 2016-12-10 ENCOUNTER — Ambulatory Visit (INDEPENDENT_AMBULATORY_CARE_PROVIDER_SITE_OTHER): Payer: Self-pay | Admitting: Urology

## 2016-12-10 VITALS — BP 127/86 | HR 75 | Ht 75.0 in | Wt 242.0 lb

## 2016-12-10 DIAGNOSIS — C672 Malignant neoplasm of lateral wall of bladder: Secondary | ICD-10-CM

## 2016-12-10 DIAGNOSIS — R310 Gross hematuria: Secondary | ICD-10-CM

## 2016-12-10 DIAGNOSIS — N323 Diverticulum of bladder: Secondary | ICD-10-CM

## 2016-12-10 NOTE — Patient Instructions (Signed)
   BCG Instillation Patient Education  BCG installations are done by inserting a catheter into the opening of the urethra, then into the bladder. When the catheter is in the bladder, medication (BCG) will be passed through the catheter into the bladder. The usual course of treatment is once a week for six weeks. Treatment may be postponed depending on urinalysis.  Before Instillation Please be on time for your instillation treatment and prepared to provide a urine specimen to check for infection prior to treatment. Inform your doctor if you have felt feverish, tired or had chills since your last treatment or if you have been urinating any bright red blood before your instillation Empty your bladder just before the instillation and give urine specimen to the nurse to check  During Instillation The medication will be instilled into your bladder through the catheter In most cases, the catheter will be removed from the bladder after the instillation is completed The medication should be retained in your bladder for 2 hours to obtain the best results  After Instillation Make sure you go straight home after procedure to avoid possibly having to use a public restroom. You should change positions several times once you get home. Ideally, you should reposition from left side to right side and also lie upon your back and your abdomen, changing these positions every 15 minutes to maximize bladder exposure to the medication for one hour. Now that you have retained the medication in your bladder for the past two hours, there are several things you must do: Sit down on the toilet to urinate and fully empty your bladder. After urinating, pour two (2) cups of household bleach (Clorox or equivalent) into the toilet and let sit for 15-20 minutes before flushing. Repeat the above process each time you urinate for six (6) hours after each treatment Wash your hands and genital area thoroughly after you  urinate Drink plenty of fluids after your instillation to flush your bladder.  Symptoms to watch for You may experience some burning and frequency of urination, generalized discomfort, tiredness, flu-like symptoms or joint pain after your treatment. There may be some blood in the urine. You should call if there is heavy bleeding in the urine or if there is fever over 100.44F or chills.  Follow up plan Bladder cancer has a strong tendency to come back throughout your lifetime. It is extremely important that you keep your BCG appointment and have lifelong follow up with your urologist to look in the bladder periodically and check the urine for cancer cells.  [place], [place] [address] [phone]

## 2016-12-12 NOTE — Progress Notes (Signed)
12/10/2016 1:17 PM   Richard Oconnell December 04, 1955 [id number]  Referring provider: [name], DO [address] [place], [place] [address]  Chief Complaint  Patient presents with  . Routine Post Op    HPI:  61 year old male initially presenting with gross hematuria found to have bladder tumor as well as a tumor within the bladder diverticulum he returns today for catheter removal/review of pathology results following TURBT on 12/05/2016.  Intraoperatively, clot was removed from the bladder. Upon inspection of his bladder, there are 2 discrete tumors in the bladder, one measuring 2.5 cm in the left lateral wall just superior to the bladder diverticulum highly suspicious for an invasive tumor. In addition to this, there was a velvety patch just below the diverticulum as well as a papillary lesion within the diverticulum itself.  A Foley was placed at the end of the procedure given concern for depth of biopsy. He returns today for Foley removal.  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  Additional workup with noncontrast CT abdomen pelvis as well as bilateral retrograde pyelograms were negative for any upper tract pathology.  Postop, he denies any issues. He does have some discomfort with a Foley catheter but otherwise his gross hematuria has resolved. No fevers or chills.   PMH: Past Medical History:  Diagnosis Date  . Diabetes mellitus without complication ([place])   . Erectile dysfunction associated with type 2 diabetes mellitus ([place]) 05/26/2016  . Gross hematuria 11/16/2016  . Hypertension 06/11/2015  . Type 2 diabetes mellitus with diabetic polyneuropathy, without long-term current use of insulin ([place]) 06/11/2015    Surgical History: Past Surgical History:  Procedure Laterality Date  . boil lanceted     hand  . CYSTOSCOPY W/ RETROGRADES  Bilateral 12/06/2016   Procedure: CYSTOSCOPY WITH RETROGRADE PYELOGRAM;  Surgeon: Hollice Espy, MD;  Location: [facility] ORS;  Service: Urology;  Laterality: Bilateral;  . ingrown  Bilateral    ingrown toenail  . TRANSURETHRAL RESECTION OF BLADDER TUMOR N/A 12/06/2016   Procedure: TRANSURETHRAL RESECTION OF BLADDER TUMOR (TURBT) (2-5cm) CLOT EVACUATION;  Surgeon: Hollice Espy, MD;  Location: [facility] ORS;  Service: Urology;  Laterality: N/A;    Home Medications:  Allergies as of 12/10/2016   No Known Allergies     Medication List       Accurate as of 12/10/16 11:59 PM. Always use your most recent med list.          acetaminophen 500 MG tablet Commonly known as:  TYLENOL Take 1,000 mg by mouth daily as needed for mild pain.   docusate sodium 100 MG capsule Commonly known as:  COLACE Take 1 capsule (100 mg total) by mouth 2 (two) times daily as needed (take to keep stool soft.).   HYDROcodone-acetaminophen 5-325 MG tablet Commonly known as:  NORCO/VICODIN Take 1-2 tablets by mouth every 6 (six) hours as needed for moderate pain.   IMODIUM A-D PO Take 1 tablet by mouth daily as needed (diarrhea).   metFORMIN 1000 MG tablet Commonly known as:  GLUCOPHAGE Take 1 tablet (1,000 mg total) by mouth 2 (two) times daily with a meal.   oxybutynin 5 MG tablet Commonly known as:  DITROPAN Take 1 tablet (5 mg total) by mouth every 8 (eight) hours as needed for bladder spasms.   sildenafil 20 MG tablet Commonly known as:  REVATIO Take 1 tablet (20 mg total) by  mouth as needed. Take 1-5 pills as needed for erectile dysfunction.       Allergies: No Known Allergies  Family History: Family History  Problem Relation Age of Onset  . Heart attack Mother   . Heart disease Mother   . Parkinson's disease Father   . Heart attack Maternal Grandfather   . Heart disease Maternal Grandfather   . Heart attack Maternal Aunt   . Heart attack Maternal Grandmother   . Prostate cancer Neg Hx   .  Bladder Cancer Neg Hx   . Kidney cancer Neg Hx     Social History:  reports that he has never smoked. He has never used smokeless tobacco. He reports that he does not drink alcohol or use drugs.  ROS: UROLOGY Frequent Urination?: No Hard to postpone urination?: No Burning/pain with urination?: No Get up at night to urinate?: No Leakage of urine?: No Urine stream starts and stops?: No Trouble starting stream?: No Do you have to strain to urinate?: No Blood in urine?: No Urinary tract infection?: No Sexually transmitted disease?: No Injury to kidneys or bladder?: No Painful intercourse?: No Weak stream?: No Erection problems?: No Penile pain?: No  Gastrointestinal Nausea?: No Vomiting?: No Indigestion/heartburn?: No Diarrhea?: No Constipation?: No  Constitutional Fever: No Night sweats?: No Weight loss?: No Fatigue?: No  Skin Skin rash/lesions?: No Itching?: No  Eyes Blurred vision?: No Double vision?: No  Ears/Nose/Throat Sore throat?: No Sinus problems?: No  Hematologic/Lymphatic Swollen glands?: No Easy bruising?: No  Cardiovascular Leg swelling?: No Chest pain?: No  Respiratory Cough?: No Shortness of breath?: No  Endocrine Excessive thirst?: No  Musculoskeletal Back pain?: No Joint pain?: No  Neurological Headaches?: No Dizziness?: No  Psychologic Depression?: No Anxiety?: No  Physical Exam: BP 127/86 (BP Location: Left Arm, Patient Position: Sitting, Cuff Size: Large)   Pulse 75   Ht 6\' 3"  (1.905 m)   Wt 242 lb (109.8 kg)   BMI 30.25 kg/m   Constitutional:  Alert and oriented, No acute distress.  Presents today accompanied by sister. HEENT: [place] AT, moist mucus membranes.  Trachea midline, no masses. Cardiovascular: No clubbing, cyanosis, or edema. Respiratory: Normal respiratory effort, no increased work of breathing. GI: Abdomen is soft, nontender, nondistended, no abdominal masses GU: Foley catheter in place during clear  yellow urine Skin: No rashes, bruises or suspicious lesions. Neurologic: Grossly intact, no focal deficits, moving all 4 extremities. Psychiatric: Normal mood and affect.  Laboratory Data: Lab Results  Component Value Date   WBC 8.9 11/23/2016   HGB 14.3 11/23/2016   HCT 42.4 11/23/2016   MCV 87.8 11/23/2016   PLT 300 11/23/2016    Lab Results  Component Value Date   CREATININE 1.03 11/24/2016    Lab Results  Component Value Date   HGBA1C 6.4 (H) 11/24/2016    Urinalysis N/a  Pertinent Imaging: CT renal stone 11/1016  Assessment & Plan:    1. Malignant neoplasm of lateral wall of urinary bladder ([place]) Allergy discussed with the patient, consistent with high-grade T1 disease with concomitant CIS Per AUA guidelines, I recommended returning to the operating room  Re-TUR with mitomycin. He understands risk of upstaging pathology is approximately 10%.  We did discuss options following TURBT if his tumor remains consistent with the above pathology. Primarily, we discussed BCG at length today. He understands the course as well as possible side effects including infection and sepsis along with failure to respond. All questions were answered.  2. Bladder diverticulum HgTa within  tic, resected  3. Gross hematuria Secondary to #1, resolved   Schedule surgery  Hollice Espy, MD  [facility] [address] [place], [place] [address] [phone]

## 2016-12-13 ENCOUNTER — Telehealth: Payer: Self-pay | Admitting: Radiology

## 2016-12-13 ENCOUNTER — Other Ambulatory Visit: Payer: Self-pay | Admitting: Radiology

## 2016-12-13 DIAGNOSIS — C672 Malignant neoplasm of lateral wall of bladder: Secondary | ICD-10-CM

## 2016-12-13 NOTE — Telephone Encounter (Signed)
Pt scheduled for TURBT with Dr Erlene Quan on 01/18/17. Made pt aware of surgery date, pre-admit testing appt & to call Friday prior to surgery for arrival time to [facility]. Questions answered. Pt voices understanding.

## 2017-01-07 ENCOUNTER — Encounter
Admission: RE | Admit: 2017-01-07 | Discharge: 2017-01-07 | Disposition: A | Discharge status: Home / Self Care | Payer: Self-pay | Source: Ambulatory Visit | Attending: Urology | Admitting: Urology

## 2017-01-07 DIAGNOSIS — C672 Malignant neoplasm of lateral wall of bladder: Secondary | ICD-10-CM | POA: Insufficient documentation

## 2017-01-07 DIAGNOSIS — Z01818 Encounter for other preprocedural examination: Secondary | ICD-10-CM | POA: Insufficient documentation

## 2017-01-07 HISTORY — DX: Malignant (primary) neoplasm, unspecified: C80.1

## 2017-01-07 LAB — URINALYSIS, ROUTINE W REFLEX MICROSCOPIC
Bacteria, UA: NONE SEEN
Bilirubin Urine: NEGATIVE
Glucose, UA: 50 mg/dL — AB
Ketones, ur: NEGATIVE mg/dL
Leukocytes, UA: NEGATIVE
Nitrite: NEGATIVE
Protein, ur: NEGATIVE mg/dL
Specific Gravity, Urine: 1.02 (ref 1.005–1.030)
Squamous Epithelial / LPF: NONE SEEN
pH: 5 (ref 5.0–8.0)

## 2017-01-07 NOTE — Patient Instructions (Signed)
  Your procedure is scheduled on: January 18, 2017 ([place] ) Report to Same Day Surgery 2nd floor medical mall [facility] Entrance-take elevator on left to 2nd floor.  Check in with surgery information desk.) To find out your arrival time please call [phone] between 1PM - 3PM on January 14, 2017 (FRIDAY )  Remember: Instructions that are not followed completely may result in serious medical risk, up to and including death, or upon the discretion of your surgeon and anesthesiologist your surgery may need to be rescheduled.    _x___ 1. Do not eat food or drink liquids after midnight. No gum chewing or  hard candies                          __x__ 2. No Alcohol for 24 hours before or after surgery.   __x__3. No Smoking for 24 prior to surgery.   ____  4. Bring all medications with you on the day of surgery if instructed.    __x__ 5. Notify your doctor if there is any change in your medical condition     (cold, fever, infections).     Do not wear jewelry, make-up, hairpins, clips or nail polish.  Do not wear lotions, powders, or perfumes. You may wear deodorant.  Do not shave 48 hours prior to surgery. Men may shave face and neck.  Do not bring valuables to the hospital.    [facility] is not responsible for any belongings or valuables.               Contacts, dentures or bridgework may not be worn into surgery.  Leave your suitcase in the car. After surgery it may be brought to your room.  For patients admitted to the hospital, discharge time is determined by your                       treatment team.   Patients discharged the day of surgery will not be allowed to drive home.  You will need someone to drive you home and stay with you the night of your procedure.    Please read over the following fact sheets that you were given:   [facility] Preparing for Surgery and or MRSA Information   ____ Take anti-hypertensive (unless it includes a diuretic), cardiac, seizure, asthma,      anti-reflux and psychiatric medicines. These include:  1.   2.  3.  4.  5.  6.  ____Fleets enema or Magnesium Citrate as directed.   ___ Use CHG Soap or sage wipes as directed on instruction sheet   ____ Use inhalers on the day of surgery and bring to hospital day of surgery  __x__ Stop Metformin  2 days prior to surgery. (STOP METFORMIN ON SEPTMBER   2 )    ____ Take 1/2 of usual insulin dose the night before surgery and none on the morning  surgey      _x___ Follow recommendations from Cardiologist, Pulmonologist or PCP regarding          stopping Aspirin, Coumadin, Plavix ,Eliquis, Effient, or Pradaxa, and Pletal.  X____Stop Anti-inflammatories such as Advil, Aleve, Ibuprofen, Motrin, Naproxen, Naprosyn, Goodies powders or aspirin products. OK to take Tylenol    _x___ Stop supplements until after surgery.  But may continue Vitamin D, Vitamin B,and multivitamin         ____ Bring C-Pap to the hospital.

## 2017-01-08 LAB — URINE CULTURE: Culture: NO GROWTH

## 2017-01-08 NOTE — Pre-Procedure Instructions (Signed)
UA results sent to Dr. Brandon for review.  

## 2017-01-17 MED ORDER — CEFAZOLIN SODIUM-DEXTROSE 2-4 GM/100ML-% IV SOLN
2.0000 g | INTRAVENOUS | Status: AC
  Administered 2017-01-18: 2 g via INTRAVENOUS

## 2017-01-18 ENCOUNTER — Ambulatory Visit: Payer: Self-pay | Admitting: Anesthesiology

## 2017-01-18 ENCOUNTER — Ambulatory Visit
Admission: RE | Admit: 2017-01-18 | Discharge: 2017-01-18 | Disposition: A | Discharge status: Home / Self Care | Payer: Self-pay | Source: Ambulatory Visit | Attending: Urology | Admitting: Urology

## 2017-01-18 ENCOUNTER — Encounter
Admission: RE | Disposition: A | Discharge status: Home / Self Care | Payer: Self-pay | Source: Ambulatory Visit | Attending: Urology

## 2017-01-18 DIAGNOSIS — C672 Malignant neoplasm of lateral wall of bladder: Secondary | ICD-10-CM | POA: Insufficient documentation

## 2017-01-18 DIAGNOSIS — E1142 Type 2 diabetes mellitus with diabetic polyneuropathy: Secondary | ICD-10-CM | POA: Insufficient documentation

## 2017-01-18 DIAGNOSIS — R310 Gross hematuria: Secondary | ICD-10-CM

## 2017-01-18 DIAGNOSIS — Z7984 Long term (current) use of oral hypoglycemic drugs: Secondary | ICD-10-CM | POA: Insufficient documentation

## 2017-01-18 DIAGNOSIS — N323 Diverticulum of bladder: Secondary | ICD-10-CM | POA: Insufficient documentation

## 2017-01-18 DIAGNOSIS — N529 Male erectile dysfunction, unspecified: Secondary | ICD-10-CM | POA: Insufficient documentation

## 2017-01-18 DIAGNOSIS — I10 Essential (primary) hypertension: Secondary | ICD-10-CM | POA: Insufficient documentation

## 2017-01-18 DIAGNOSIS — E1169 Type 2 diabetes mellitus with other specified complication: Secondary | ICD-10-CM | POA: Insufficient documentation

## 2017-01-18 HISTORY — PX: TRANSURETHRAL RESECTION OF BLADDER TUMOR WITH MITOMYCIN-C: SHX6459

## 2017-01-18 LAB — GLUCOSE, CAPILLARY
Glucose-Capillary: 143 mg/dL — ABNORMAL HIGH (ref 65–99)
Glucose-Capillary: 157 mg/dL — ABNORMAL HIGH (ref 65–99)

## 2017-01-18 SURGERY — TRANSURETHRAL RESECTION OF BLADDER TUMOR WITH MITOMYCIN-C
Anesthesia: General | Wound class: Clean Contaminated

## 2017-01-18 MED ORDER — MIDAZOLAM HCL 2 MG/2ML IJ SOLN
INTRAMUSCULAR | Status: AC
  Filled 2017-01-18: qty 2

## 2017-01-18 MED ORDER — FENTANYL CITRATE (PF) 100 MCG/2ML IJ SOLN: 25.0000 ug | INTRAMUSCULAR | Status: DC | PRN

## 2017-01-18 MED ORDER — FENTANYL CITRATE (PF) 100 MCG/2ML IJ SOLN
INTRAMUSCULAR | Status: DC | PRN
  Administered 2017-01-18: 25 ug via INTRAVENOUS
  Administered 2017-01-18: 200 ug via INTRAVENOUS
  Administered 2017-01-18: 25 ug via INTRAVENOUS

## 2017-01-18 MED ORDER — SUGAMMADEX SODIUM 500 MG/5ML IV SOLN
INTRAVENOUS | Status: AC
  Filled 2017-01-18: qty 5

## 2017-01-18 MED ORDER — MIDAZOLAM HCL 2 MG/2ML IJ SOLN
INTRAMUSCULAR | Status: DC | PRN
  Administered 2017-01-18: 2 mg via INTRAVENOUS

## 2017-01-18 MED ORDER — OXYBUTYNIN CHLORIDE 5 MG PO TABS: 5.0000 mg | ORAL_TABLET | Freq: Three times a day (TID) | ORAL | 0 refills | Status: DC | PRN

## 2017-01-18 MED ORDER — SUGAMMADEX SODIUM 200 MG/2ML IV SOLN
INTRAVENOUS | Status: DC | PRN
  Administered 2017-01-18: 200 mg via INTRAVENOUS

## 2017-01-18 MED ORDER — DEXAMETHASONE SODIUM PHOSPHATE 4 MG/ML IJ SOLN
INTRAMUSCULAR | Status: DC | PRN
  Administered 2017-01-18: 6 mg via INTRAVENOUS

## 2017-01-18 MED ORDER — CEFAZOLIN SODIUM-DEXTROSE 2-4 GM/100ML-% IV SOLN
INTRAVENOUS | Status: AC
  Filled 2017-01-18: qty 100

## 2017-01-18 MED ORDER — MITOMYCIN CHEMO FOR BLADDER INSTILLATION 40 MG
40.0000 mg | Freq: Once | INTRAVENOUS | Status: DC
  Filled 2017-01-18: qty 40

## 2017-01-18 MED ORDER — PROMETHAZINE HCL 25 MG/ML IJ SOLN: 6.2500 mg | INTRAMUSCULAR | Status: DC | PRN

## 2017-01-18 MED ORDER — FENTANYL CITRATE (PF) 250 MCG/5ML IJ SOLN
INTRAMUSCULAR | Status: AC
Start: 2017-01-18 — End: ?
  Filled 2017-01-18: qty 5

## 2017-01-18 MED ORDER — LIDOCAINE HCL (CARDIAC) 20 MG/ML IV SOLN
INTRAVENOUS | Status: DC | PRN
  Administered 2017-01-18 (×2): 100 mg via INTRAVENOUS

## 2017-01-18 MED ORDER — SUCCINYLCHOLINE CHLORIDE 20 MG/ML IJ SOLN
INTRAMUSCULAR | Status: DC | PRN
  Administered 2017-01-18: 100 mg via INTRAVENOUS

## 2017-01-18 MED ORDER — LIDOCAINE HCL (PF) 2 % IJ SOLN
INTRAMUSCULAR | Status: AC
  Filled 2017-01-18: qty 2

## 2017-01-18 MED ORDER — MITOMYCIN CHEMO FOR BLADDER INSTILLATION 40 MG
INTRAVENOUS | Status: DC | PRN
  Administered 2017-01-18: 40 mg via INTRAVESICAL

## 2017-01-18 MED ORDER — SODIUM CHLORIDE 0.9 % IV SOLN
INTRAVENOUS | Status: DC
  Administered 2017-01-18: 09:00:00 via INTRAVENOUS

## 2017-01-18 MED ORDER — ROCURONIUM BROMIDE 100 MG/10ML IV SOLN
INTRAVENOUS | Status: DC | PRN
  Administered 2017-01-18: 35 mg via INTRAVENOUS

## 2017-01-18 MED ORDER — ONDANSETRON HCL 4 MG/2ML IJ SOLN
INTRAMUSCULAR | Status: AC
  Filled 2017-01-18: qty 2

## 2017-01-18 MED ORDER — FAMOTIDINE 20 MG PO TABS
20.0000 mg | ORAL_TABLET | Freq: Once | ORAL | Status: AC
  Administered 2017-01-18: 20 mg via ORAL

## 2017-01-18 MED ORDER — ONDANSETRON HCL 4 MG/2ML IJ SOLN
INTRAMUSCULAR | Status: DC | PRN
  Administered 2017-01-18: 4 mg via INTRAVENOUS

## 2017-01-18 MED ORDER — PROPOFOL 10 MG/ML IV BOLUS
INTRAVENOUS | Status: DC | PRN
  Administered 2017-01-18: 150 mg via INTRAVENOUS
  Administered 2017-01-18: 50 mg via INTRAVENOUS

## 2017-01-18 MED ORDER — PROPOFOL 10 MG/ML IV BOLUS
INTRAVENOUS | Status: AC
  Filled 2017-01-18: qty 20

## 2017-01-18 MED ORDER — FAMOTIDINE 20 MG PO TABS
ORAL_TABLET | ORAL | Status: AC
  Administered 2017-01-18: 20 mg via ORAL
  Filled 2017-01-18: qty 1

## 2017-01-18 MED ORDER — HYDROCODONE-ACETAMINOPHEN 5-325 MG PO TABS: 1.0000 | ORAL_TABLET | Freq: Four times a day (QID) | ORAL | 0 refills | Status: DC | PRN

## 2017-01-18 SURGICAL SUPPLY — 27 items
ADAPTER IRRIG TUBE 2 SPIKE SOL (ADAPTER) ×2 IMPLANT
BAG DRAIN CYSTO-URO LG1000N (MISCELLANEOUS) IMPLANT
BAG URO DRAIN 2000ML W/SPOUT (MISCELLANEOUS) ×2 IMPLANT
CATH FOLEY 2WAY  5CC 16FR (CATHETERS) ×1
CATH URTH 16FR FL 2W BLN LF (CATHETERS) ×1 IMPLANT
DRAPE UTILITY 15X26 TOWEL STRL (DRAPES) ×2 IMPLANT
DRSG TELFA 4X3 1S NADH ST (GAUZE/BANDAGES/DRESSINGS) ×2 IMPLANT
ELECT LOOP 22F BIPOLAR SML (ELECTROSURGICAL) ×2
ELECT REM PT RETURN 9FT ADLT (ELECTROSURGICAL) ×2
ELECTRODE LOOP 22F BIPOLAR SML (ELECTROSURGICAL) ×1 IMPLANT
ELECTRODE REM PT RTRN 9FT ADLT (ELECTROSURGICAL) ×1 IMPLANT
GLOVE BIO SURGEON STRL SZ 6.5 (GLOVE) ×4 IMPLANT
GOWN STRL REUS W/ TWL LRG LVL3 (GOWN DISPOSABLE) ×2 IMPLANT
GOWN STRL REUS W/TWL LRG LVL3 (GOWN DISPOSABLE) ×2
KIT RM TURNOVER CYSTO AR (KITS) ×2 IMPLANT
LOOP CUT BIPOLAR 24F LRG (ELECTROSURGICAL) IMPLANT
NDL SAFETY ECLIPSE 18X1.5 (NEEDLE) IMPLANT
NEEDLE HYPO 18GX1.5 SHARP (NEEDLE)
PACK CYSTO AR (MISCELLANEOUS) ×2 IMPLANT
SCRUB POVIDONE IODINE 4 OZ (MISCELLANEOUS) ×2 IMPLANT
SET IRRIG Y TYPE TUR BLADDER L (SET/KITS/TRAYS/PACK) ×2 IMPLANT
SET IRRIGATING DISP (SET/KITS/TRAYS/PACK) ×2 IMPLANT
SOL .9 NS 3000ML IRR  AL (IV SOLUTION) ×5
SOL .9 NS 3000ML IRR UROMATIC (IV SOLUTION) ×5 IMPLANT
SURGILUBE 2OZ TUBE FLIPTOP (MISCELLANEOUS) ×2 IMPLANT
SYRINGE IRR TOOMEY STRL 70CC (SYRINGE) ×2 IMPLANT
WATER STERILE IRR 1000ML POUR (IV SOLUTION) ×2 IMPLANT

## 2017-01-18 NOTE — Op Note (Signed)
Date of procedure: 01/18/17  Preoperative diagnosis:  1. Bladder cancer of left lateral wall 2. Gross hematuria   Postoperative diagnosis:  1. Same as above   Procedure: 1. TURBT, small 2. Instillation of intravesical mitomycin  Surgeon: Hollice Espy, MD  Anesthesia: General  Complications: None  Intraoperative findings: velvety erythematous plaque with a fine papillary change measuring up to 2 cm involving the left lateral wall of the bladder just beyond the UO and extending beyond small bladder diverticulum. Areas of necrosis from previous TURBT appreciated within this area. No obvious active residual tumor within small bladder diverticulum, small patches of necrosis from previous resection seen.  Additional erythematous patch on left anterior wall bladder concerning for a second focus of CIS.  EBL:  minimal  Specimens:  Bladder tumor  Drains: 16 Fr Foley  Indication: Richard Oconnell is a 61 y.o. patient with with a history of high-grade T1 TCC involving the left lateral bladder all, high-grade TA a within bladder diverticulum and concomitant CIS who returns to the operating room today for re-TURBT.  After reviewing the management options for treatment, he elected to proceed with the above surgical procedure(s). We have discussed the potential benefits and risks of the procedure, side effects of the proposed treatment, the likelihood of the patient achieving the goals of the procedure, and any potential problems that might occur during the procedure or recuperation. Informed consent has been obtained.  Description of procedure:  The patient was taken to the operating room and general anesthesia was induced.  The patient was placed in the dorsal lithotomy position, prepped and draped in the usual sterile fashion, and preoperative antibiotics were administered. A preoperative time-out was performed.   Male sounds were used to gently dilate the fossa navicularis.  45 [place]  scope was then advanced per urethra into the bladder.Attention was turned to the left lateral wall where a large patch of erythema was seen just beyond the UO extending above small opening to bladder diverticulum measuring approximately 2 x 2 centimeters. The mass patch, there is fine papillary change and a necrotic adherent material presumably related to previous TURBT. There is a second smaller patch on the left anterior wall of the bladder concerning for a second focus of CIS. There was no obvious residual invasive tumor identified.The cystoscope was then exchanged for resectoscope and using a small bipolar loop and saline as a medium, the mucosal surface of the erythematous area was peeled off and sent for pathology as a permanent specimen. Cold cup biopsies were then used and the areas of necrosis to take some deeper bites which were sent altogether as one single specimen. The scope was then advanced into the bladder diverticulum on the left lateral wall which was approximately the size of the peak of the scope. Within the tic, there is no obvious residual viable tumor. There was a small area of necrosis with a posterior wall from previous resection. This was fulgurated as a precaution.Careful hemostasis was achieved. The second focus of CIS (presumably) on the left anterior wall was also fulgurated for therapeutic purposes. Adequate hemostasis achieved. All specimen was passed off the field. The bladder was then drained and a 16 French Foley catheter was placed with the balloon inflated with 10 cc of sterile water. Patient was then reversed from anesthesia after being cleaned and dried and taken to the PACU in stable condition. 40 mg of intravesical mitomycin was instilled into the bladder and allowed to dwell for 1 hour. This was well-tolerated.  At the end of the procedure, the bladder was drained and the catheter was removed.  Plan: I will call the patient with pathology results. He is rescheduled for BCG  to start in approximate 4 weeks.    Hollice Espy, M.D.

## 2017-01-18 NOTE — Anesthesia Procedure Notes (Signed)
Procedure Name: Intubation Date/Time: 01/18/2017 9:39 AM Performed by: Rosaria Ferries, Phu Record Pre-anesthesia Checklist: Patient identified, Emergency Drugs available, Suction available and Patient being monitored Patient Re-evaluated:Patient Re-evaluated prior to induction Oxygen Delivery Method: Circle system utilized Preoxygenation: Pre-oxygenation with 100% oxygen Induction Type: IV induction Laryngoscope Size: Mac and 3 Grade View: Grade II Tube size: 7.0 mm Number of attempts: 1 Airway Equipment and Method: Stylet Placement Confirmation: ETT inserted through vocal cords under direct vision,  positive ETCO2 and breath sounds checked- equal and bilateral Secured at: 23 cm Tube secured with: Tape Dental Injury: Teeth and Oropharynx as per pre-operative assessment

## 2017-01-18 NOTE — Progress Notes (Signed)
Patient states he is not taking lisinopril and has not For over a year now because it made him cough.

## 2017-01-18 NOTE — Progress Notes (Signed)
Foley catheter unclamped.

## 2017-01-18 NOTE — Anesthesia Post-op Follow-up Note (Signed)
Anesthesia QCDR form completed.        

## 2017-01-18 NOTE — Progress Notes (Signed)
Dr. Rosey Bath made aware via phone of elevated bp, Was high on arrival today.  No further orders.

## 2017-01-18 NOTE — Anesthesia Preprocedure Evaluation (Signed)
Anesthesia Evaluation  Patient identified by MRN, date of birth, ID band Patient awake    Reviewed: Allergy & Precautions, NPO status , Patient's Chart, lab work & pertinent test results  History of Anesthesia Complications Negative for: history of anesthetic complications  Airway Mallampati: II  TM Distance: >3 FB     Dental  (+) Chipped, Caps   Pulmonary neg pulmonary ROS,           Cardiovascular Exercise Tolerance: Good hypertension, Pt. on medications (-) angina(-) CAD, (-) Past MI, (-) Cardiac Stents and (-) CABG (-) dysrhythmias (-) Valvular Problems/Murmurs     Neuro/Psych neg Seizures  Neuromuscular disease negative psych ROS   GI/Hepatic negative GI ROS, Neg liver ROS,   Endo/Other  diabetes  Renal/GU      Musculoskeletal   Abdominal Normal abdominal exam  (+)   Peds negative pediatric ROS (+)  Hematology negative hematology ROS (+)   Anesthesia Other Findings Past Medical History: No date: Diabetes mellitus without complication ([place]) 1/61/0960: Erectile dysfunction associated with type 2 diabetes  mellitus ([place]) 11/16/2016: Gross hematuria 06/11/2015: Hypertension 06/11/2015: Type 2 diabetes mellitus with diabetic polyneuropathy,  without long-term current use of insulin ([facility])  Reproductive/Obstetrics negative OB ROS                             Anesthesia Physical  Anesthesia Plan  ASA: II  Anesthesia Plan: General   Post-op Pain Management:    Induction: Intravenous  PONV Risk Score and Plan: 3 and Ondansetron, Dexamethasone, Propofol and Midazolam  Airway Management Planned: Oral ETT and LMA  Additional Equipment:   Intra-op Plan:   Post-operative Plan: Extubation in OR  Informed Consent: I have reviewed the patients History and Physical, chart, labs and discussed the procedure including the risks, benefits and alternatives for the proposed anesthesia with the  patient or authorized representative who has indicated his/her understanding and acceptance.   Dental advisory given  Plan Discussed with: CRNA and Surgeon  Anesthesia Plan Comments:         Anesthesia Quick Evaluation

## 2017-01-18 NOTE — Progress Notes (Signed)
Dr. Erlene Quan at bedside, wants foley catheter removed, Patient is not to go home with catheter.

## 2017-01-18 NOTE — Discharge Instructions (Addendum)
Transurethral Resection of Bladder Tumor (TURBT) or Bladder Biopsy ° ° °Definition: ° Transurethral Resection of the Bladder Tumor is a surgical procedure used to diagnose and remove tumors within the bladder. TURBT is the most common treatment for early stage bladder cancer. ° °General instructions: °   ° Your recent bladder surgery requires very little post hospital care but some definite precautions. ° °Despite the fact that no skin incisions were used, the area around the bladder incisions are raw and covered with scabs to promote healing and prevent bleeding. Certain precautions are needed to insure that the scabs are not disturbed over the next 2-4 weeks while the healing proceeds. ° °Because the raw surface inside your bladder and the irritating effects of urine you may expect frequency of urination and/or urgency (a stronger desire to urinate) and perhaps even getting up at night more often. This will usually resolve or improve slowly over the healing period. You may see some blood in your urine over the first 6 weeks. Do not be alarmed, even if the urine was clear for a while. Get off your feet and drink lots of fluids until clearing occurs. If you start to pass clots or don't improve call us. ° °Diet: ° °You may return to your normal diet immediately. Because of the raw surface of your bladder, alcohol, spicy foods, foods high in acid and drinks with caffeine may cause irritation or frequency and should be used in moderation. To keep your urine flowing freely and avoid constipation, drink plenty of fluids during the day (8-10 glasses). Tip: Avoid cranberry juice because it is very acidic. ° °Activity: ° °Your physical activity doesn't need to be restricted. However, if you are very active, you may see some blood in the urine. We suggest that you reduce your activity under the circumstances until the bleeding has stopped. ° °Bowels: ° °It is important to keep your bowels regular during the postoperative  period. Straining with bowel movements can cause bleeding. A bowel movement every other day is reasonable. Use a mild laxative if needed, such as milk of magnesia 2-3 tablespoons, or 2 Dulcolax tablets. Call if you continue to have problems. If you had been taking narcotics for pain, before, during or after your surgery, you may be constipated. Take a laxative if necessary. ° ° ° °Medication: ° °You should resume your pre-surgery medications unless told not to. In addition you may be given an antibiotic to prevent or treat infection. Antibiotics are not always necessary. All medication should be taken as prescribed until the bottles are finished unless you are having an unusual reaction to one of the drugs. ° ° °[facility] °[address], [address] °[place],  [address] °[phone] ° ° ° ° °AMBULATORY SURGERY  °DISCHARGE INSTRUCTIONS ° ° °1) The drugs that you were given will stay in your system until tomorrow so for the next 24 hours you should not: ° °A) Drive an automobile °B) Make any legal decisions °C) Drink any alcoholic beverage ° ° °2) You may resume regular meals tomorrow.  Today it is better to start with liquids and gradually work up to solid foods. ° °You may eat anything you prefer, but it is better to start with liquids, then soup and crackers, and gradually work up to solid foods. ° ° °3) Please notify your doctor immediately if you have any unusual bleeding, trouble breathing, redness and pain at the surgery site, drainage, fever, or pain not relieved by medication. ° ° ° °  4) Additional Instructions: ° ° ° ° ° ° ° °Please contact your physician with any problems or Same Day Surgery at [phone], Monday through Friday 6 am to 4 pm, or [facility] [place] [facility] number at [phone]. ° ° ° °

## 2017-01-18 NOTE — Progress Notes (Signed)
Patient has foley catheter in place and is clamped From the OR.

## 2017-01-18 NOTE — H&P (Signed)
12/10/2016  --> updated 01/18/17 without change.  RRR. CTAB.  Richard Oconnell Jan 09, 1956 [id number]  Referring provider: [name], DO [address] [place], [place] [address]     Chief Complaint  Patient presents with  . Routine Post Op    HPI:  61 year old male initially presenting with gross hematuria found to have bladder tumor as well as a tumor within the bladder diverticulum he returns today for catheter removal/review of pathology results following TURBT on 12/05/2016.  Intraoperatively, clot was removed from the bladder. Upon inspection of his bladder, there are 2 discrete tumors in the bladder, one measuring 2.5 cm in the left lateral wall just superior to the bladder diverticulum highly suspicious for an invasive tumor. In addition to this, there was a velvety patch just below the diverticulum as well as a papillary lesion within the diverticulum itself.  A Foley was placed at the end of the procedure given concern for depth of biopsy. He returns today for Foley removal.  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  Additional workup with noncontrast CT abdomen pelvis as well as bilateral retrograde pyelograms were negative for any upper tract pathology.  Postop, he denies any issues. He does have some discomfort with a Foley catheter but otherwise his gross hematuria has resolved. No fevers or chills.   PMH:     Past Medical History:  Diagnosis Date  . Diabetes mellitus without complication ([place])   . Erectile dysfunction associated with type 2 diabetes mellitus ([place]) 05/26/2016  . Gross hematuria 11/16/2016  . Hypertension 06/11/2015  . Type 2 diabetes mellitus with diabetic polyneuropathy, without long-term current use of insulin ([place]) 06/11/2015    Surgical History:      Past Surgical History:  Procedure  Laterality Date  . boil lanceted     hand  . CYSTOSCOPY W/ RETROGRADES Bilateral 12/06/2016   Procedure: CYSTOSCOPY WITH RETROGRADE PYELOGRAM;  Surgeon: Hollice Espy, MD;  Location: [facility] ORS;  Service: Urology;  Laterality: Bilateral;  . ingrown  Bilateral    ingrown toenail  . TRANSURETHRAL RESECTION OF BLADDER TUMOR N/A 12/06/2016   Procedure: TRANSURETHRAL RESECTION OF BLADDER TUMOR (TURBT) (2-5cm) CLOT EVACUATION;  Surgeon: Hollice Espy, MD;  Location: [facility] ORS;  Service: Urology;  Laterality: N/A;    Home Medications:  Allergies as of 12/10/2016   No Known Allergies                 Medication List            Accurate as of 12/10/16 11:59 PM. Always use your most recent med list.           acetaminophen 500 MG tablet Commonly known as:  TYLENOL Take 1,000 mg by mouth daily as needed for mild pain.   docusate sodium 100 MG capsule Commonly known as:  COLACE Take 1 capsule (100 mg total) by mouth 2 (two) times daily as needed (take to keep stool soft.).   HYDROcodone-acetaminophen 5-325 MG tablet Commonly known as:  NORCO/VICODIN Take 1-2 tablets by mouth every 6 (six) hours as needed for moderate pain.   IMODIUM A-D PO Take 1 tablet by mouth daily as needed (diarrhea).   metFORMIN 1000 MG tablet Commonly known as:  GLUCOPHAGE Take 1 tablet (1,000 mg total) by mouth 2 (two) times daily with a meal.   oxybutynin 5 MG tablet Commonly known as:  DITROPAN Take 1 tablet (5 mg total) by mouth every 8 (eight) hours as needed for bladder spasms.   sildenafil 20 MG tablet Commonly known as:  REVATIO Take 1 tablet (20 mg total) by mouth as needed. Take 1-5 pills as needed for erectile dysfunction.       Allergies: No Known Allergies  Family History:      Family History  Problem Relation Age of Onset  . Heart attack Mother   . Heart disease Mother   . Parkinson's disease Father   . Heart attack Maternal Grandfather   . Heart  disease Maternal Grandfather   . Heart attack Maternal Aunt   . Heart attack Maternal Grandmother   . Prostate cancer Neg Hx   . Bladder Cancer Neg Hx   . Kidney cancer Neg Hx     Social History:  reports that he has never smoked. He has never used smokeless tobacco. He reports that he does not drink alcohol or use drugs.  ROS: UROLOGY Frequent Urination?: No Hard to postpone urination?: No Burning/pain with urination?: No Get up at night to urinate?: No Leakage of urine?: No Urine stream starts and stops?: No Trouble starting stream?: No Do you have to strain to urinate?: No Blood in urine?: No Urinary tract infection?: No Sexually transmitted disease?: No Injury to kidneys or bladder?: No Painful intercourse?: No Weak stream?: No Erection problems?: No Penile pain?: No  Gastrointestinal Nausea?: No Vomiting?: No Indigestion/heartburn?: No Diarrhea?: No Constipation?: No  Constitutional Fever: No Night sweats?: No Weight loss?: No Fatigue?: No  Skin Skin rash/lesions?: No Itching?: No  Eyes Blurred vision?: No Double vision?: No  Ears/Nose/Throat Sore throat?: No Sinus problems?: No  Hematologic/Lymphatic Swollen glands?: No Easy bruising?: No  Cardiovascular Leg swelling?: No Chest pain?: No  Respiratory Cough?: No Shortness of breath?: No  Endocrine Excessive thirst?: No  Musculoskeletal Back pain?: No Joint pain?: No  Neurological Headaches?: No Dizziness?: No  Psychologic Depression?: No Anxiety?: No  Physical Exam: BP 127/86 (BP Location: Left Arm, Patient Position: Sitting, Cuff Size: Large)   Pulse 75   Ht 6\' 3"  (1.905 m)   Wt 242 lb (109.8 kg)   BMI 30.25 kg/m   Constitutional:  Alert and oriented, No acute distress.  Presents today accompanied by sister. HEENT:  AT, moist mucus membranes.  Trachea midline, no masses. Cardiovascular: No clubbing, cyanosis, or edema. Respiratory: Normal  respiratory effort, no increased work of breathing. GI: Abdomen is soft, nontender, nondistended, no abdominal masses GU: Foley catheter in place during clear yellow urine Skin: No rashes, bruises or suspicious lesions. Neurologic: Grossly intact, no focal deficits, moving all 4 extremities. Psychiatric: Normal mood and affect.  Laboratory Data: RecentLabs       Lab Results  Component Value Date   WBC 8.9 11/23/2016   HGB 14.3 11/23/2016   HCT 42.4 11/23/2016   MCV 87.8 11/23/2016   PLT 300 11/23/2016      RecentLabs       Lab Results  Component Value Date   CREATININE 1.03 11/24/2016      RecentLabs       Lab Results  Component Value Date   HGBA1C 6.4 (H) 11/24/2016      Urinalysis N/a  Pertinent Imaging: CT renal stone 11/2016  Assessment & Plan:    1. 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 He understands risk of upstaging pathology is approximately 10%.  We did discuss options following TURBT if his tumor remains consistent with the above pathology. Primarily, we discussed BCG at length today. He understands the course as well as possible side effects including infection and sepsis along with failure to respond. All questions were answered.  2. Bladder diverticulum HgTa within tic, resected  3. Gross hematuria Secondary to #1, resolved   Schedule surgery  Hollice Espy, MD  [facility] [address] [place],  [address] [phone]

## 2017-01-18 NOTE — Transfer of Care (Signed)
Immediate Anesthesia Transfer of Care Note  [name]  Procedure(s) Performed: Procedure(s): TRANSURETHRAL RESECTION OF BLADDER TUMOR WITH MITOMYCIN-C-(SMALL) (N/A)  Patient Location: PACU  Anesthesia Type:General  Level of Consciousness: awake, alert , oriented and patient cooperative  Airway & Oxygen Therapy: Patient Spontanous Breathing and Patient connected to nasal cannula oxygen  Post-op Assessment: Report given to RN and Post -op Vital signs reviewed and stable  Post vital signs: Reviewed and stable  Last Vitals:  Vitals:   01/18/17 0802  BP: (!) 149/101  Pulse: 69  Resp: 20  Temp: 36.8 C    Last Pain:  Vitals:   01/18/17 0802  TempSrc: Tympanic         Complications: No apparent anesthesia complications

## 2017-01-18 NOTE — Progress Notes (Signed)
Foley catheter removed, patient tolerated well. 

## 2017-01-18 NOTE — Anesthesia Postprocedure Evaluation (Signed)
Anesthesia Post Note  [name]  Procedure(s) Performed: Procedure(s) (LRB): TRANSURETHRAL RESECTION OF BLADDER TUMOR WITH MITOMYCIN-C-(SMALL) (N/A)  Patient location during evaluation: PACU Anesthesia Type: General Level of consciousness: awake and alert Pain management: pain level controlled Vital Signs Assessment: post-procedure vital signs reviewed and stable Respiratory status: spontaneous breathing, nonlabored ventilation, respiratory function stable and patient connected to nasal cannula oxygen Cardiovascular status: blood pressure returned to baseline and stable Postop Assessment: no signs of nausea or vomiting Anesthetic complications: no     Last Vitals:  Vitals:   01/18/17 1145 01/18/17 1152  BP:  (!) 151/94  Pulse: 74 70  Resp: 19 18  Temp:  (!) 36.4 C  SpO2: 100% 100%    Last Pain:  Vitals:   01/18/17 1152  TempSrc: Temporal                 Martha Clan

## 2017-01-19 ENCOUNTER — Encounter: Payer: Self-pay | Admitting: Urology

## 2017-01-19 LAB — SURGICAL PATHOLOGY

## 2017-02-01 ENCOUNTER — Telehealth: Payer: Self-pay

## 2017-02-01 NOTE — Telephone Encounter (Signed)
Pt called stating he was passing small stringy pieces that look like blood clots. Pt denied inability to urinate, n/v, f/c, dysuria. Reinforced with pt to increase fluid in take and monitor for UTI s/s. Pt voiced understanding of whole conversation.

## 2017-02-15 NOTE — Progress Notes (Signed)
02/17/2017 10:34 AM   [name] [birth year] [id number]  Referring provider: [name], DO [address] [place], [place] [address]  Chief Complaint  Patient presents with  . 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  This will be one of 6.  Background history  61 year old male initially presenting with gross hematuria found to have bladder tumor as well as a tumor within the bladder diverticulum underwent a TURBT on 12/05/2016.  Intraoperatively, clot was removed from the bladder. Upon inspection of his bladder, there are 2 discrete tumors in the bladder, one measuring 2.5 cm in the left lateral wall just superior to the bladder diverticulum highly suspicious for an invasive tumor. In addition to this, there was a velvety patch just below the diverticulum as well as a papillary lesion within the diverticulum itself.  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  Additional workup with noncontrast CT abdomen pelvis as well as bilateral retrograde pyelograms were negative for any upper tract pathology.  Re-TURBT performed on 01/18/2017 noted intraoperative findings of velvety erythematous plaque with a fine papillary change measuring up to 2 cm involving the left lateral wall of the bladder just beyond the UO and extending beyond small bladder diverticulum. Areas of necrosis from previous TURBT appreciated within this area. No obvious active residual tumor within small bladder diverticulum, small patches of necrosis from previous resection seen. Additional erythematous patch on left anterior wall bladder concerning for a second focus of CIS.  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  Procedure and risks reviewed with the patient.  His questions are answered. He has  read and signed the consent and is ready to proceed.    Today, he is not experiencing dysuria, suprapubic pain or gross hematuria.  He does not have a cough.  He has not had recent fevers, chills, nausea or vomiting.  His UA today is positive for 3-10 RBC's.    PMH: Past Medical History:  Diagnosis Date  . Cancer [facility])    Bladder Cancer  . Diabetes mellitus without complication ([place])   . Erectile dysfunction associated with type 2 diabetes mellitus ([place]) 05/26/2016  . Gross hematuria 11/16/2016  . Hypertension 06/11/2015  . Type 2 diabetes mellitus with diabetic polyneuropathy, without long-term current use of insulin ([place]) 06/11/2015    Surgical History: Past Surgical History:  Procedure Laterality Date  . boil lanceted     hand  . CYSTOSCOPY W/ RETROGRADES Bilateral 12/06/2016   Procedure: CYSTOSCOPY WITH RETROGRADE PYELOGRAM;  Surgeon: Hollice Espy, MD;  Location: [facility] ORS;  Service: Urology;  Laterality: Bilateral;  . ingrown  Bilateral    ingrown toenail  . TRANSURETHRAL RESECTION OF BLADDER TUMOR N/A 12/06/2016   Procedure: TRANSURETHRAL RESECTION OF BLADDER TUMOR (TURBT) (2-5cm) CLOT EVACUATION;  Surgeon: Hollice Espy, MD;  Location: [facility] ORS;  Service: Urology;  Laterality: N/A;  . TRANSURETHRAL RESECTION OF BLADDER TUMOR WITH MITOMYCIN-C N/A 01/18/2017   Procedure: TRANSURETHRAL RESECTION OF BLADDER TUMOR WITH MITOMYCIN-C-(SMALL);  Surgeon: Hollice Espy, MD;  Location: [facility] ORS;  Service: Urology;  Laterality: N/A;    Home Medications:  Allergies as of 02/17/2017   No Known Allergies     Medication List       Accurate as of 02/17/17 10:34 AM. Always  use your most recent med list.          acetaminophen 500 MG tablet Commonly known as:  TYLENOL Take 1,000 mg by mouth daily as needed for mild pain.   docusate sodium 100 MG capsule Commonly known as:  COLACE Take 1 capsule (100 mg total) by mouth 2 (two) times daily as needed (take to keep stool soft.).     HYDROcodone-acetaminophen 5-325 MG tablet Commonly known as:  NORCO/VICODIN Take 1-2 tablets by mouth every 6 (six) hours as needed for moderate pain.   IMODIUM A-D PO Take 1 tablet by mouth daily as needed (diarrhea).   metFORMIN 1000 MG tablet Commonly known as:  GLUCOPHAGE Take 1 tablet (1,000 mg total) by mouth 2 (two) times daily with a meal.   oxybutynin 5 MG tablet Commonly known as:  DITROPAN Take 1 tablet (5 mg total) by mouth every 8 (eight) hours as needed for bladder spasms.   sildenafil 20 MG tablet Commonly known as:  REVATIO Take 1 tablet (20 mg total) by mouth as needed. Take 1-5 pills as needed for erectile dysfunction.       Allergies: No Known Allergies  Family History: Family History  Problem Relation Age of Onset  . Heart attack Mother   . Heart disease Mother   . Parkinson's disease Father   . Heart attack Maternal Grandfather   . Heart disease Maternal Grandfather   . Heart attack Maternal Aunt   . Heart attack Maternal Grandmother   . Prostate cancer Neg Hx   . Bladder Cancer Neg Hx   . Kidney cancer Neg Hx     Social History:  reports that he has never smoked. He has never used smokeless tobacco. He reports that he does not drink alcohol or use drugs.  ROS: UROLOGY Frequent Urination?: No Hard to postpone urination?: No Burning/pain with urination?: No Get up at night to urinate?: No Leakage of urine?: No Urine stream starts and stops?: No Trouble starting stream?: No Do you have to strain to urinate?: No Blood in urine?: No Urinary tract infection?: No Sexually transmitted disease?: No Injury to kidneys or bladder?: No Painful intercourse?: No Weak stream?: No Erection problems?: No Penile pain?: No  Gastrointestinal Nausea?: No Vomiting?: No Indigestion/heartburn?: No Diarrhea?: No Constipation?: No  Constitutional Fever: No Night sweats?: No Weight loss?: No Fatigue?: No  Skin Skin rash/lesions?: No Itching?:  No  Eyes Blurred vision?: No Double vision?: No  Ears/Nose/Throat Sore throat?: No Sinus problems?: No  Hematologic/Lymphatic Swollen glands?: No Easy bruising?: No  Cardiovascular Leg swelling?: No Chest pain?: No  Respiratory Cough?: No Shortness of breath?: No  Endocrine Excessive thirst?: No  Musculoskeletal Back pain?: No Joint pain?: No  Neurological Headaches?: No Dizziness?: No  Psychologic Depression?: No Anxiety?: No  Physical Exam: BP (!) 165/101   Pulse 69   Ht 6\' 3"  (1.905 m)   Wt 261 lb 8 oz (118.6 kg)   BMI 32.69 kg/m   Constitutional:  Alert and oriented, No acute distress.  Presents today accompanied by sister. HEENT: [place] AT, moist mucus membranes.  Trachea midline, no masses. Cardiovascular: No clubbing, cyanosis, or edema. Respiratory: Normal respiratory effort, no increased work of breathing. GI: Abdomen is soft, nontender, nondistended, no abdominal masses GU: Foley catheter in place during clear yellow urine Skin: No rashes, bruises or suspicious lesions. Neurologic: Grossly intact, no focal deficits, moving all 4 extremities. Psychiatric: Normal mood and affect.  Laboratory Data: Lab Results  Component Value Date  WBC 8.9 11/23/2016   HGB 14.3 11/23/2016   HCT 42.4 11/23/2016   MCV 87.8 11/23/2016   PLT 300 11/23/2016    Lab Results  Component Value Date   CREATININE 1.03 11/24/2016    Lab Results  Component Value Date   HGBA1C 6.4 (H) 11/24/2016    Urinalysis 3-10 RBC's.  See EPIC.    Pertinent Imaging: CT renal stone 11/1016  Assessment & Plan:    1. Malignant neoplasm of lateral wall of urinary bladder (HCC)  - high-grade T1 disease with concomitant CIS  - Reviewed BCG treatment course, possible side effects including BCG sepsis, bladder irritation, worsening of her urinary symptoms  - # 1 of 6 BCG installed today  - Patient was instructed to pour bleach down his/her toilet for the next 6 hours  -   Instructed to call the office if she should experience fevers greater than 102, chills/rigors, onset of a new cough, night sweats or further bladder spasms or inability to urinate   - RTC in one week for # 2 BCG  - Surveillance protocol also discussed today including cystoscopy every 3 months for at least 2 years and then spread out thereafter  - Urinalysis, Complete  - bcg vaccine injection 81 mg; Instill 3.24 mLs (81 mg total) into the bladder once.  - lidocaine (XYLOCAINE) 2 % jelly 1 application; Place 1 application into the urethra once.  2. Bladder diverticulum HgTa within tic, resected  3. History of hematuria Secondary to #1   Zara Council, [facility]  [place] [address] [place], [place] [address] [phone]

## 2017-02-17 ENCOUNTER — Encounter: Payer: Self-pay | Admitting: Urology

## 2017-02-17 ENCOUNTER — Ambulatory Visit (INDEPENDENT_AMBULATORY_CARE_PROVIDER_SITE_OTHER): Payer: Self-pay | Admitting: Urology

## 2017-02-17 VITALS — BP 165/101 | HR 69 | Ht 75.0 in | Wt 261.5 lb

## 2017-02-17 DIAGNOSIS — Z87448 Personal history of other diseases of urinary system: Secondary | ICD-10-CM

## 2017-02-17 DIAGNOSIS — N323 Diverticulum of bladder: Secondary | ICD-10-CM

## 2017-02-17 DIAGNOSIS — C672 Malignant neoplasm of lateral wall of bladder: Secondary | ICD-10-CM

## 2017-02-17 LAB — URINALYSIS, COMPLETE
Bilirubin, UA: NEGATIVE
Glucose, UA: NEGATIVE
Ketones, UA: NEGATIVE
Nitrite, UA: NEGATIVE
Protein, UA: NEGATIVE
Specific Gravity, UA: 1.02 (ref 1.005–1.030)
Urobilinogen, Ur: 0.2 mg/dL (ref 0.2–1.0)
pH, UA: 6 (ref 5.0–7.5)

## 2017-02-17 LAB — MICROSCOPIC EXAMINATION: Epithelial Cells (non renal): NONE SEEN /HPF

## 2017-02-17 MED ORDER — LIDOCAINE HCL 2 % EX GEL
1.0000 "application " | Freq: Once | CUTANEOUS | Status: AC
  Administered 2017-02-17: 1 via URETHRAL

## 2017-02-17 MED ORDER — BCG LIVE 50 MG IS SUSR
3.2400 mL | Freq: Once | INTRAVESICAL | Status: AC
  Administered 2017-02-17: 81 mg via INTRAVESICAL

## 2017-02-17 NOTE — Progress Notes (Signed)
BCG Bladder Instillation  BCG # 1  Due to Bladder Cancer patient is present today for a BCG treatment. Patient was cleaned and prepped in a sterile fashion with betadine and lidocaine 2% jelly was instilled into the urethra.  A 14FR catheter was inserted, urine return was noted 20ml, urine was yellow in color.  50ml of reconstituted BCG was instilled into the bladder. The catheter was then removed. Patient tolerated well, no complications were noted.  Preformed by: Shannon McGowan PA-C and Ramona Williams CMA Follow up/ Additional notes: One week   

## 2017-02-17 NOTE — Addendum Note (Signed)
Addended by: Orlene Erm on: 02/17/2017 12:11 PM   Modules accepted: Orders

## 2017-02-23 NOTE — Progress Notes (Signed)
02/24/2017 10:19 AM   [name] 17-Jan-1956 [id number]  Referring provider: [name], DO [address] [place], [place] [address]  Chief Complaint  Patient presents with  . Bladder Cancer    BCG 2 of 6     HPI: 61 yo WM with HgTa TCC who presents today to to undergo an induction course of intravesical BCG.  This will be 2 of 6.  Background history  61 year old male initially presenting with gross hematuria found to have bladder tumor as well as a tumor within the bladder diverticulum underwent a TURBT on 12/05/2016.  Intraoperatively, clot was removed from the bladder. Upon inspection of his bladder, there are 2 discrete tumors in the bladder, one measuring 2.5 cm in the left lateral wall just superior to the bladder diverticulum highly suspicious for an invasive tumor. In addition to this, there was a velvety patch just below the diverticulum as well as a papillary lesion within the diverticulum itself.  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  Additional workup with noncontrast CT abdomen pelvis as well as bilateral retrograde pyelograms were negative for any upper tract pathology.  Re-TURBT performed on 01/18/2017 noted intraoperative findings of velvety erythematous plaque with a fine papillary change measuring up to 2 cm involving the left lateral wall of the bladder just beyond the UO and extending beyond small bladder diverticulum. Areas of necrosis from previous TURBT appreciated within this area. No obvious active residual tumor within small bladder diverticulum, small patches of necrosis from previous resection seen. Additional erythematous patch on left anterior wall bladder concerning for a second focus of CIS.  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  Today, he is not experiencing dysuria, suprapubic pain or gross hematuria.  He  does not have a cough.  He has not had recent fevers, chills, nausea or vomiting.  His UA today is unremarkable.      PMH: Past Medical History:  Diagnosis Date  . Cancer [facility])    Bladder Cancer  . Diabetes mellitus without complication ([place])   . Erectile dysfunction associated with type 2 diabetes mellitus ([place]) 05/26/2016  . Gross hematuria 11/16/2016  . Hypertension 06/11/2015  . Type 2 diabetes mellitus with diabetic polyneuropathy, without long-term current use of insulin ([place]) 06/11/2015    Surgical History: Past Surgical History:  Procedure Laterality Date  . boil lanceted     hand  . CYSTOSCOPY W/ RETROGRADES Bilateral 12/06/2016   Procedure: CYSTOSCOPY WITH RETROGRADE PYELOGRAM;  Surgeon: Hollice Espy, MD;  Location: [facility] ORS;  Service: Urology;  Laterality: Bilateral;  . ingrown  Bilateral    ingrown toenail  . TRANSURETHRAL RESECTION OF BLADDER TUMOR N/A 12/06/2016   Procedure: TRANSURETHRAL RESECTION OF BLADDER TUMOR (TURBT) (2-5cm) CLOT EVACUATION;  Surgeon: Hollice Espy, MD;  Location: [facility] ORS;  Service: Urology;  Laterality: N/A;  . TRANSURETHRAL RESECTION OF BLADDER TUMOR WITH MITOMYCIN-C N/A 01/18/2017   Procedure: TRANSURETHRAL RESECTION OF BLADDER TUMOR WITH MITOMYCIN-C-(SMALL);  Surgeon: Hollice Espy, MD;  Location: [facility] ORS;  Service: Urology;  Laterality: N/A;    Home Medications:  Allergies as of 02/24/2017   No Known Allergies     Medication List       Accurate as of 02/24/17 10:19 AM. Always use your most recent med list.          acetaminophen 500 MG tablet Commonly known as:  TYLENOL Take 1,000  mg by mouth daily as needed for mild pain.   docusate sodium 100 MG capsule Commonly known as:  COLACE Take 1 capsule (100 mg total) by mouth 2 (two) times daily as needed (take to keep stool soft.).   HYDROcodone-acetaminophen 5-325 MG tablet Commonly known as:  NORCO/VICODIN Take 1-2 tablets by mouth every 6 (six) hours as needed for moderate  pain.   IMODIUM A-D PO Take 1 tablet by mouth daily as needed (diarrhea).   metFORMIN 1000 MG tablet Commonly known as:  GLUCOPHAGE Take 1 tablet (1,000 mg total) by mouth 2 (two) times daily with a meal.   oxybutynin 5 MG tablet Commonly known as:  DITROPAN Take 1 tablet (5 mg total) by mouth every 8 (eight) hours as needed for bladder spasms.   sildenafil 20 MG tablet Commonly known as:  REVATIO Take 1 tablet (20 mg total) by mouth as needed. Take 1-5 pills as needed for erectile dysfunction.       Allergies: No Known Allergies  Family History: Family History  Problem Relation Age of Onset  . Heart attack Mother   . Heart disease Mother   . Parkinson's disease Father   . Heart attack Maternal Grandfather   . Heart disease Maternal Grandfather   . Heart attack Maternal Aunt   . Heart attack Maternal Grandmother   . Prostate cancer Neg Hx   . Bladder Cancer Neg Hx   . Kidney cancer Neg Hx     Social History:  reports that he has never smoked. He has never used smokeless tobacco. He reports that he does not drink alcohol or use drugs.  ROS: UROLOGY Frequent Urination?: No Hard to postpone urination?: No Burning/pain with urination?: No Get up at night to urinate?: No Leakage of urine?: No Urine stream starts and stops?: No Trouble starting stream?: No Do you have to strain to urinate?: No Blood in urine?: No Urinary tract infection?: No Sexually transmitted disease?: No Injury to kidneys or bladder?: No Painful intercourse?: No Weak stream?: No Erection problems?: No Penile pain?: No  Gastrointestinal Nausea?: No Vomiting?: No Indigestion/heartburn?: No Diarrhea?: No Constipation?: No  Constitutional Fever: No Night sweats?: No Weight loss?: No Fatigue?: No  Skin Skin rash/lesions?: No Itching?: No  Eyes Blurred vision?: No Double vision?: No  Ears/Nose/Throat Sore throat?: No Sinus problems?: No  Hematologic/Lymphatic Swollen  glands?: No Easy bruising?: No  Cardiovascular Leg swelling?: No Chest pain?: No  Respiratory Cough?: No Shortness of breath?: No  Endocrine Excessive thirst?: No  Musculoskeletal Back pain?: No Joint pain?: No  Neurological Headaches?: No Dizziness?: No  Psychologic Depression?: No Anxiety?: No  Physical Exam: BP (!) 150/93   Pulse 67   Ht 6\' 3"  (1.905 m)   Wt 260 lb (117.9 kg)   BMI 32.50 kg/m   Constitutional:  Alert and oriented, No acute distress.  Presents today accompanied by sister. HEENT: [place] AT, moist mucus membranes.  Trachea midline, no masses. Cardiovascular: No clubbing, cyanosis, or edema. Respiratory: Normal respiratory effort, no increased work of breathing. GI: Abdomen is soft, nontender, nondistended, no abdominal masses GU: Foley catheter in place during clear yellow urine Skin: No rashes, bruises or suspicious lesions. Neurologic: Grossly intact, no focal deficits, moving all 4 extremities. Psychiatric: Normal mood and affect.  Laboratory Data: Lab Results  Component Value Date   WBC 8.9 11/23/2016   HGB 14.3 11/23/2016   HCT 42.4 11/23/2016   MCV 87.8 11/23/2016   PLT 300 11/23/2016    Lab  Results  Component Value Date   CREATININE 1.03 11/24/2016    Lab Results  Component Value Date   HGBA1C 6.4 (H) 11/24/2016    Urinalysis Negative.  See EPIC.    I have reviewed the labs.  Pertinent Imaging: CT renal stone 11/1016  Assessment & Plan:    1. Malignant neoplasm of lateral wall of urinary bladder (HCC)  - high-grade T1 disease with concomitant CIS  - Reviewed BCG treatment course, possible side effects including BCG sepsis, bladder irritation, worsening of her urinary symptoms  - # 2 of 6 BCG installed today  - Patient was instructed to pour bleach down his/her toilet for the next 6 hours  -  Instructed to call the office if she should experience fevers greater than 102, chills/rigors, onset of a new cough, night sweats  or further bladder spasms or inability to urinate   - RTC in one week for # 3 BCG  - Surveillance protocol also discussed today including cystoscopy every 3 months for at least 2 years and then spread out thereafter  - Urinalysis, Complete  - bcg vaccine injection 81 mg; Instill 3.24 mLs (81 mg total) into the bladder once.  - lidocaine (XYLOCAINE) 2 % jelly 1 application; Place 1 application into the urethra once.  2. Bladder diverticulum HgTa within tic, resected  3. History of hematuria Secondary to #1   Zara Council, [facility]  [place] [address], [place], [place] [address] [phone]

## 2017-02-24 ENCOUNTER — Encounter: Payer: Self-pay | Admitting: Urology

## 2017-02-24 ENCOUNTER — Ambulatory Visit: Payer: Self-pay | Admitting: Urology

## 2017-02-24 ENCOUNTER — Ambulatory Visit (INDEPENDENT_AMBULATORY_CARE_PROVIDER_SITE_OTHER): Payer: Self-pay | Admitting: Urology

## 2017-02-24 VITALS — BP 150/93 | HR 67 | Ht 75.0 in | Wt 260.0 lb

## 2017-02-24 DIAGNOSIS — Z87448 Personal history of other diseases of urinary system: Secondary | ICD-10-CM

## 2017-02-24 DIAGNOSIS — C672 Malignant neoplasm of lateral wall of bladder: Secondary | ICD-10-CM

## 2017-02-24 DIAGNOSIS — N323 Diverticulum of bladder: Secondary | ICD-10-CM

## 2017-02-24 LAB — URINALYSIS, COMPLETE
Bilirubin, UA: NEGATIVE
Glucose, UA: NEGATIVE
Ketones, UA: NEGATIVE
Nitrite, UA: NEGATIVE
Protein, UA: NEGATIVE
Specific Gravity, UA: 1.01 (ref 1.005–1.030)
Urobilinogen, Ur: 0.2 mg/dL (ref 0.2–1.0)
pH, UA: 5.5 (ref 5.0–7.5)

## 2017-02-24 LAB — MICROSCOPIC EXAMINATION
Bacteria, UA: NONE SEEN
Epithelial Cells (non renal): NONE SEEN /hpf (ref 0–10)

## 2017-02-24 MED ORDER — LIDOCAINE HCL 2 % EX GEL
1.0000 | Freq: Once | CUTANEOUS | Status: AC
Start: 2017-02-24 — End: 2017-02-24
  Administered 2017-02-24: 1 via URETHRAL

## 2017-02-24 MED ORDER — BCG LIVE 50 MG IS SUSR
3.2400 mL | Freq: Once | INTRAVESICAL | Status: AC
  Administered 2017-02-24: 81 mg via INTRAVESICAL

## 2017-02-24 NOTE — Addendum Note (Signed)
Addended by: Lyndee Hensen K on: 02/24/2017 01:00 PM   Modules accepted: Orders

## 2017-02-24 NOTE — Progress Notes (Signed)
BCG Bladder Instillation  BCG # 2 of 6  Due to Bladder Cancer patient is present today for a BCG treatment. Patient was cleaned and prepped in a sterile fashion with betadine and lidocaine 2% jelly was instilled into the urethra.  A 14FR catheter was inserted, urine return was noted 259ml, urine was yellow in color.  37ml of reconstituted BCG was instilled into the bladder. The catheter was then removed. Patient tolerated well, no complications were noted.  Preformed by: Zara Council PA-C and Lyndee Hensen CMA  Follow up/ Additional notes: One week

## 2017-03-01 NOTE — Progress Notes (Signed)
03/03/2017 10:34 AM   Richard Oconnell 1955/12/12 [id number]  Referring provider: [name], DO [address] [place], [place] [address]  Chief Complaint  Patient presents with  . Bladder Cancer    BCG 3    HPI: 61 yo WM with HgTa TCC who presents today to undergo an induction course of intravesical BCG.  This will be 3 of 6.  Background history  61 year old male initially presenting with gross hematuria found to have bladder tumor as well as a tumor within the bladder diverticulum underwent a TURBT on 12/05/2016.  Intraoperatively, clot was removed from the bladder. Upon inspection of his bladder, there are 2 discrete tumors in the bladder, one measuring 2.5 cm in the left lateral wall just superior to the bladder diverticulum highly suspicious for an invasive tumor. In addition to this, there was a velvety patch just below the diverticulum as well as a papillary lesion within the diverticulum itself.  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  Additional workup with noncontrast CT abdomen pelvis as well as bilateral retrograde pyelograms were negative for any upper tract pathology.  Re-TURBT performed on 01/18/2017 noted intraoperative findings of velvety erythematous plaque with a fine papillary change measuring up to 2 cm involving the left lateral wall of the bladder just beyond the UO and extending beyond small bladder diverticulum. Areas of necrosis from previous TURBT appreciated within this area. No obvious active residual tumor within small bladder diverticulum, small patches of necrosis from previous resection seen. Additional erythematous patch on left anterior wall bladder concerning for a second focus of CIS.  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  Today, he is not experiencing dysuria, suprapubic pain or gross hematuria.  He does not  have a cough.  He has not had recent fevers, chills, nausea or vomiting.  He was able to hold instillation for 2 hours after his last treatment. His UA today is positive for 6-10 WBC's.    He is complaining of a left big toe nail issue.  He states the nail has fallen off and the tip of his toe is red and inflamed.  PMH: Past Medical History:  Diagnosis Date  . Cancer [facility])    Bladder Cancer  . Diabetes mellitus without complication ([place])   . Erectile dysfunction associated with type 2 diabetes mellitus ([place]) 05/26/2016  . Gross hematuria 11/16/2016  . Hypertension 06/11/2015  . Type 2 diabetes mellitus with diabetic polyneuropathy, without long-term current use of insulin ([place]) 06/11/2015    Surgical History: Past Surgical History:  Procedure Laterality Date  . boil lanceted     hand  . CYSTOSCOPY W/ RETROGRADES Bilateral 12/06/2016   Procedure: CYSTOSCOPY WITH RETROGRADE PYELOGRAM;  Surgeon: Hollice Espy, MD;  Location: [facility] ORS;  Service: Urology;  Laterality: Bilateral;  . ingrown  Bilateral    ingrown toenail  . TRANSURETHRAL RESECTION OF BLADDER TUMOR N/A 12/06/2016   Procedure: TRANSURETHRAL RESECTION OF BLADDER TUMOR (TURBT) (2-5cm) CLOT EVACUATION;  Surgeon: Hollice Espy, MD;  Location: [facility] ORS;  Service: Urology;  Laterality: N/A;  . TRANSURETHRAL RESECTION OF BLADDER TUMOR WITH MITOMYCIN-C N/A 01/18/2017   Procedure: TRANSURETHRAL RESECTION OF BLADDER TUMOR WITH MITOMYCIN-C-(SMALL);  Surgeon: Hollice Espy, MD;  Location: [facility] ORS;  Service: Urology;  Laterality: N/A;    Home Medications:  Allergies as of 03/03/2017   No Known Allergies     Medication List  Accurate as of 03/03/17 10:34 AM. Always use your most recent med list.          acetaminophen 500 MG tablet Commonly known as:  TYLENOL Take 1,000 mg by mouth daily as needed for mild pain.   docusate sodium 100 MG capsule Commonly known as:  COLACE Take 1 capsule (100 mg total) by mouth 2 (two)  times daily as needed (take to keep stool soft.).   HYDROcodone-acetaminophen 5-325 MG tablet Commonly known as:  NORCO/VICODIN Take 1-2 tablets by mouth every 6 (six) hours as needed for moderate pain.   IMODIUM A-D PO Take 1 tablet by mouth daily as needed (diarrhea).   metFORMIN 1000 MG tablet Commonly known as:  GLUCOPHAGE Take 1 tablet (1,000 mg total) by mouth 2 (two) times daily with a meal.   oxybutynin 5 MG tablet Commonly known as:  DITROPAN Take 1 tablet (5 mg total) by mouth every 8 (eight) hours as needed for bladder spasms.   sildenafil 20 MG tablet Commonly known as:  REVATIO Take 1 tablet (20 mg total) by mouth as needed. Take 1-5 pills as needed for erectile dysfunction.       Allergies: No Known Allergies  Family History: Family History  Problem Relation Age of Onset  . Heart attack Mother   . Heart disease Mother   . Parkinson's disease Father   . Heart attack Maternal Grandfather   . Heart disease Maternal Grandfather   . Heart attack Maternal Aunt   . Heart attack Maternal Grandmother   . Prostate cancer Neg Hx   . Bladder Cancer Neg Hx   . Kidney cancer Neg Hx     Social History:  reports that he has never smoked. He has never used smokeless tobacco. He reports that he does not drink alcohol or use drugs.  ROS: UROLOGY Frequent Urination?: No Hard to postpone urination?: No Burning/pain with urination?: No Get up at night to urinate?: No Leakage of urine?: No Urine stream starts and stops?: No Trouble starting stream?: No Do you have to strain to urinate?: No Blood in urine?: No Urinary tract infection?: No Sexually transmitted disease?: No Injury to kidneys or bladder?: No Painful intercourse?: No Weak stream?: No Erection problems?: No Penile pain?: No  Gastrointestinal Nausea?: No Vomiting?: No Indigestion/heartburn?: No Diarrhea?: No Constipation?: No  Constitutional Fever: No Night sweats?: No Weight loss?:  No Fatigue?: No  Skin Skin rash/lesions?: No Itching?: No  Eyes Blurred vision?: No Double vision?: No  Ears/Nose/Throat Sore throat?: No Sinus problems?: No  Hematologic/Lymphatic Swollen glands?: No Easy bruising?: No  Cardiovascular Leg swelling?: No Chest pain?: No  Respiratory Cough?: No Shortness of breath?: No  Endocrine Excessive thirst?: No  Musculoskeletal Back pain?: No Joint pain?: No  Neurological Headaches?: No Dizziness?: No  Psychologic Depression?: No Anxiety?: No  Physical Exam: BP (!) 156/97   Pulse 68   Ht 6\' 3"  (1.905 m)   Wt 261 lb 8 oz (118.6 kg)   BMI 32.69 kg/m   Constitutional:  Alert and oriented, No acute distress.  Presents today accompanied by sister. HEENT: [place] AT, moist mucus membranes.  Trachea midline, no masses. Cardiovascular: No clubbing, cyanosis, or edema. Respiratory: Normal respiratory effort, no increased work of breathing. GI: Abdomen is soft, nontender, nondistended, no abdominal masses GU: Foley catheter in place during clear yellow urine Skin: No rashes, bruises or suspicious lesions. Neurologic: Grossly intact, no focal deficits, moving all 4 extremities. Psychiatric: Normal mood and affect.  Laboratory Data:  Lab Results  Component Value Date   WBC 8.9 11/23/2016   HGB 14.3 11/23/2016   HCT 42.4 11/23/2016   MCV 87.8 11/23/2016   PLT 300 11/23/2016    Lab Results  Component Value Date   CREATININE 1.03 11/24/2016    Lab Results  Component Value Date   HGBA1C 6.4 (H) 11/24/2016    Urinalysis 6-10 WBC's.  See EPIC.    I have reviewed the labs.  Pertinent Imaging: CT renal stone 11/1016  Assessment & Plan:    1. Malignant neoplasm of lateral wall of urinary bladder (HCC)  - high-grade T1 disease with concomitant CIS  - Reviewed BCG treatment course, possible side effects including BCG sepsis, bladder irritation, worsening of her urinary symptoms  - # 3 of 6 BCG installed today  -  Patient was instructed to pour bleach down his/her toilet for the next 6 hours  -  Instructed to call the office if she should experience fevers greater than 102, chills/rigors, onset of a new cough, night sweats or further bladder spasms or inability to urinate   - RTC in one week for # 4 BCG  - Surveillance protocol also discussed today including cystoscopy every 3 months for at least 2 years and then spread out thereafter  - Urinalysis, Complete  - bcg vaccine injection 81 mg; Instill 3.24 mLs (81 mg total) into the bladder once.  - lidocaine (XYLOCAINE) 2 % jelly 1 application; Place 1 application into the urethra once.  2. Bladder diverticulum HgTa within tic, resected  3. History of hematuria Secondary to #1  4. Ingrown toenail  - Advised the patient to contact his PCP as he is a diabetic and feet infections can worsen quickly   Zara Council, PA-C  [facility] [address], [place], [place] [address] [phone]

## 2017-03-03 ENCOUNTER — Ambulatory Visit: Payer: Self-pay | Admitting: Urology

## 2017-03-03 ENCOUNTER — Ambulatory Visit (INDEPENDENT_AMBULATORY_CARE_PROVIDER_SITE_OTHER): Payer: Self-pay | Admitting: Urology

## 2017-03-03 ENCOUNTER — Encounter: Payer: Self-pay | Admitting: Urology

## 2017-03-03 VITALS — BP 156/97 | HR 68 | Ht 75.0 in | Wt 261.5 lb

## 2017-03-03 DIAGNOSIS — N323 Diverticulum of bladder: Secondary | ICD-10-CM

## 2017-03-03 DIAGNOSIS — L600 Ingrowing nail: Secondary | ICD-10-CM

## 2017-03-03 DIAGNOSIS — Z87448 Personal history of other diseases of urinary system: Secondary | ICD-10-CM

## 2017-03-03 DIAGNOSIS — C672 Malignant neoplasm of lateral wall of bladder: Secondary | ICD-10-CM

## 2017-03-03 LAB — URINALYSIS, COMPLETE
Bilirubin, UA: NEGATIVE
Glucose, UA: NEGATIVE
Ketones, UA: NEGATIVE
Nitrite, UA: NEGATIVE
Protein, UA: NEGATIVE
Specific Gravity, UA: 1.01 (ref 1.005–1.030)
Urobilinogen, Ur: 0.2 mg/dL (ref 0.2–1.0)
pH, UA: 5.5 (ref 5.0–7.5)

## 2017-03-03 LAB — MICROSCOPIC EXAMINATION
Bacteria, UA: NONE SEEN
Epithelial Cells (non renal): NONE SEEN /hpf (ref 0–10)

## 2017-03-03 LAB — MICROALBUMIN, URINE: Microalb, Ur: NEGATIVE

## 2017-03-03 MED ORDER — LIDOCAINE HCL 2 % EX GEL
1.0000 | Freq: Once | CUTANEOUS | Status: AC
Start: 2017-03-03 — End: 2017-03-03
  Administered 2017-03-03: 1 via URETHRAL

## 2017-03-03 MED ORDER — BCG LIVE 50 MG IS SUSR
3.2400 mL | Freq: Once | INTRAVESICAL | Status: AC
Start: 2017-03-03 — End: 2017-03-03
  Administered 2017-03-03: 81 mg via INTRAVESICAL

## 2017-03-03 NOTE — Progress Notes (Signed)
BCG Bladder Instillation  BCG # 3  Due to Bladder Cancer patient is present today for a BCG treatment. Patient was cleaned and prepped in a sterile fashion with betadine and lidocaine 2% jelly was instilled into the urethra.  A 14FR catheter was inserted, urine return was noted 243ml, urine was yellow in color.  60ml of reconstituted BCG was instilled into the bladder. The catheter was then removed. Patient tolerated well, no complications were noted.  Preformed by: Zara Council PAC and Lyndee Hensen CMA  Follow up/ Additional notes: One Week

## 2017-03-04 ENCOUNTER — Ambulatory Visit (INDEPENDENT_AMBULATORY_CARE_PROVIDER_SITE_OTHER): Payer: Self-pay | Admitting: Family Medicine

## 2017-03-04 ENCOUNTER — Encounter: Payer: Self-pay | Admitting: Family Medicine

## 2017-03-04 VITALS — BP 134/76 | HR 77 | Temp 98.2°F | Resp 16 | Ht 75.0 in | Wt 261.0 lb

## 2017-03-04 DIAGNOSIS — C672 Malignant neoplasm of lateral wall of bladder: Secondary | ICD-10-CM

## 2017-03-04 DIAGNOSIS — L600 Ingrowing nail: Secondary | ICD-10-CM

## 2017-03-04 MED ORDER — MUPIROCIN 2 % EX OINT: 1.0000 "application " | TOPICAL_OINTMENT | Freq: Two times a day (BID) | CUTANEOUS | 0 refills | Status: DC

## 2017-03-04 MED ORDER — TRIAMCINOLONE ACETONIDE 0.5 % EX OINT: 1.0000 "application " | TOPICAL_OINTMENT | Freq: Two times a day (BID) | CUTANEOUS | 1 refills | Status: DC

## 2017-03-04 NOTE — Patient Instructions (Addendum)
Thank you for coming to the clinic today.  1. You have an Ingrown Toenail that has caused an Infection of the nailbed.  Start with topical antibiotic - Mupirocin antibiotic twice daily for 2 weeks, and then also use a topical steroid cream for reducing inflammation twice daily for up to 2 weeks.  - To help it drain pus and heal, recommend warm water soaks for 10-15 min at a time several times a day initially  (IF WE HEAR BACK FROM UROLOGY - and consider an ORAL antibiotic - we would try: - Take Clindamycin 300mg  capsules 4 times a day or every 6 hours for next 10 days)  Referral to [facility] Address: [address], [place], [place] [address] Hours: Open 8AM-5PM Phone: [phone]  Try using topical triamcinolone ointment to reduce swelling and irritation.  You may need partial or whole toenail removal as discussed. Occasionally the toenail will return to being ingrown as it grows back, important to help prevent.  If you get worsening pain, swelling or redness that spreads down toe into foot, or fevers / nausea, vomiting, unable to keep pills down, then may need to follow-up in the office or if severe worsening, may need to go to the Emergency Department for spreading skin infection.  Please schedule a Follow-up Appointment to: Return in about 3 months (around 06/04/2017), or if symptoms worsen or fail to improve, for DM A1c.  If you have any other questions or concerns, please feel free to call the clinic or send a message through [facility]. You may also schedule an earlier appointment if necessary.  Additionally, you may be receiving a survey about your experience at our clinic within a few days to 1 week by e-mail or mail. We value your feedback.  Nobie Putnam, DO [place]

## 2017-03-04 NOTE — Progress Notes (Signed)
Subjective:    [name], male    DOB: Jun 02, 1955, 61 y.o.   MRN: [id number]  Richard Oconnell is a 61 y.o. male presenting on 03/04/2017 for ingrowing toe nail (onset couple of weeks obtw pt is on BCG treatment still has 3 weeks left )   HPI   LEFT INGROWN TOENAIL WITH INFECTION - Reports new worsening problem, >3-4 weeks ago he had L toe injury with dropped something on toe with mild crushing injury, no significant problem at that time and toenail came off, but toenail began to grow back with ingrown portion of nail over past 3-4 weeks, now past 1 week with some redness pain local swelling and initial drainage of some pus Left great toenail - He has similar history of prior bilateral great toenails ingrown at young age, approx 25, both surgically removed entire toenail - he has diabetes, has been controlled, concern for potential toe infection - Denies fever/chills, spreading redness, continued drainage of pus, nausea vomiting, foot swelling  FOLLOW-UP Bladder Cancer - Followed by [facility], continues on BCG therapy for high-grade T1 lesion with CIS, also post-op resection 01/2017 of other tumors. - Continues to follow-up weekly, last seen yesterday for therapy, he continues treatment with BCG until November 11 - He denies any problems with UTI or urinary symptoms  Health Maintenance: Due Flu Shot, defer today with illness L toe infection and currently receiving BCG therapy for bladder cancer  Depression screen [facility] 2/9 03/04/2017 06/11/2015  Decreased Interest 0 0  Down, Depressed, Hopeless 0 0  PHQ - 2 Score 0 0    Social History  Substance Use Topics  . Smoking status: Never Smoker  . Smokeless tobacco: Never Used     Comment: once a while had one cigar >10 years ago  . Alcohol use No    Review of Systems Per HPI unless specifically indicated above     Objective:    BP 134/76   Pulse 77   Temp 98.2 F (36.8 C) (Oral)   Resp  16   Ht 6\' 3"  (1.905 m)   Wt 261 lb (118.4 kg)   BMI 32.62 kg/m   Wt Readings from Last 3 Encounters:  03/04/17 261 lb (118.4 kg)  03/03/17 261 lb 8 oz (118.6 kg)  02/24/17 260 lb (117.9 kg)    Physical Exam  Constitutional: He is oriented to person, place, and time. He appears well-developed and well-nourished. No distress.  Well-appearing, comfortable, cooperative  HENT:  Head: Normocephalic and atraumatic.  Mouth/Throat: Oropharynx is clear and moist.  Eyes: Conjunctivae are normal. Right eye exhibits no discharge. Left eye exhibits no discharge.  Cardiovascular: Normal rate.   Pulmonary/Chest: Effort normal.  Neurological: He is alert and oriented to person, place, and time.  Skin: Skin is warm and dry. No rash noted. He is not diaphoretic. There is erythema.  Left great toenail with ingrown nail lateral edge, small nail overall from history of total nail removal at young age - Some slight debris without purulence or bleeding - Mild tender to touch - Localized erythema and some peeling slough skin, no extending erythema into foot or elsewhere, minimal localized edema  Psychiatric: He has a normal mood and affect. His behavior is normal.  Well groomed, good eye contact, normal speech and thoughts  Nursing note and vitals reviewed.   Left Great toe    Results for orders placed or performed in visit on 03/04/17  Microalbumin, urine  Result  Value Ref Range   Microalb, Ur Negative       Assessment & Plan:   Problem List Items Addressed This Visit    Malignant neoplasm of lateral wall of urinary bladder ([place])    Followed by [facility] Continue BCG therapy now 3/6, last treatment 11/11 Proceed with surveillance plan for future       Other Visit Diagnoses    Ingrown toenail of left foot with infection    -  Primary  Ingrown Left great toenail lateral aspect, complicated by acute paronychia infection that seems to be resolving now with spontaneous drainage and some  residual redness and inflammation. Afebrile without systemic symptoms or extending cellulitis. With known DM neuropathy - Has had chronic recurrent ingrown great toenails, s/p bilateral toenail removal young age  Plan: 1. Initially recommended oral antibiotics - reconsidered based on active BCG therapy for bladder cancer - decision to defer oral antibiotics for now to avoid interfering with therapy, pending further information from Urology - would consider future start Clindamycin 300mg  QID for 7-10 days if need orals - Start Mupirocin BID x 2 weeks for topical antibiotic - Start Triamcinlone 0.5% ointment BID for 2 weeks for reducing inflammation of ingrown nail - may resume if need 2. Recommend warm water soaks to assist drainage several times a day for now 3. NSAID / Tylenol PRN 4. Referral to [facility] for evaluation, consider partial vs whole toenail removal, he will work on scheduling this and consider payment plan due to no insurance, he requests AFTER November 11 5. Reviewed aftercare instructions and avoiding recurrent ingrown toenail 6. Return criteria given. Follow-up PRN     Relevant Medications   mupirocin ointment (BACTROBAN) 2 %   triamcinolone ointment (KENALOG) 0.5 %   Other Relevant Orders   Ambulatory referral to Podiatry      Meds ordered this encounter  Medications  . mupirocin ointment (BACTROBAN) 2 %    Sig: Apply 1 application topically 2 (two) times daily. Up to 2 weeks    Dispense:  30 g    Refill:  0  . triamcinolone ointment (KENALOG) 0.5 %    Sig: Apply 1 application topically 2 (two) times daily. For 2 weeks for inflammation    Dispense:  30 g    Refill:  1    Follow up plan: Return in about 3 months (around 06/04/2017), or if symptoms worsen or fail to improve, for DM A1c.  Nobie Putnam, [facility] 03/04/2017, 12:58 PM

## 2017-03-04 NOTE — Assessment & Plan Note (Signed)
Followed by [facility] Continue BCG therapy now 3/6, last treatment 11/11 Proceed with surveillance plan for future

## 2017-03-08 NOTE — Progress Notes (Signed)
03/10/2017 9:58 AM   Richard Oconnell May 21, 1955 [id number]  Referring provider: [name], DO [address] [place], [place] [address]  Chief Complaint  Patient presents with  . 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  This will be 4 of 6.  Background history  61 year old male initially presenting with gross hematuria found to have bladder tumor as well as a tumor within the bladder diverticulum underwent a TURBT on 12/05/2016.  Intraoperatively, clot was removed from the bladder. Upon inspection of his bladder, there are 2 discrete tumors in the bladder, one measuring 2.5 cm in the left lateral wall just superior to the bladder diverticulum highly suspicious for an invasive tumor. In addition to this, there was a velvety patch just below the diverticulum as well as a papillary lesion within the diverticulum itself.  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  Additional workup with noncontrast CT abdomen pelvis as well as bilateral retrograde pyelograms were negative for any upper tract pathology.  Re-TURBT performed on 01/18/2017 noted intraoperative findings of velvety erythematous plaque with a fine papillary change measuring up to 2 cm involving the left lateral wall of the bladder just beyond the UO and extending beyond small bladder diverticulum. Areas of necrosis from previous TURBT appreciated within this area. No obvious active residual tumor within small bladder diverticulum, small patches of necrosis from previous resection seen. Additional erythematous patch on left anterior wall bladder concerning for a second focus of CIS.  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  Today, he is not experiencing dysuria, suprapubic pain or gross hematuria.  He does not have a  cough.  He has not had recent fevers, chills, nausea or vomiting.  He was able to hold instillation for 2 hours after his last treatment. His UA today is negative.    PMH: Past Medical History:  Diagnosis Date  . Cancer [facility])    Bladder Cancer  . Erectile dysfunction associated with type 2 diabetes mellitus ([place]) 05/26/2016  . Gross hematuria 11/16/2016  . Hypertension 06/11/2015  . Type 2 diabetes mellitus with diabetic polyneuropathy, without long-term current use of insulin ([place]) 06/11/2015    Surgical History: Past Surgical History:  Procedure Laterality Date  . boil lanceted     hand  . CYSTOSCOPY W/ RETROGRADES Bilateral 12/06/2016   Procedure: CYSTOSCOPY WITH RETROGRADE PYELOGRAM;  Surgeon: Hollice Espy, MD;  Location: [facility] ORS;  Service: Urology;  Laterality: Bilateral;  . ingrown  Bilateral    ingrown toenail  . TRANSURETHRAL RESECTION OF BLADDER TUMOR N/A 12/06/2016   Procedure: TRANSURETHRAL RESECTION OF BLADDER TUMOR (TURBT) (2-5cm) CLOT EVACUATION;  Surgeon: Hollice Espy, MD;  Location: [facility] ORS;  Service: Urology;  Laterality: N/A;  . TRANSURETHRAL RESECTION OF BLADDER TUMOR WITH MITOMYCIN-C N/A 01/18/2017   Procedure: TRANSURETHRAL RESECTION OF BLADDER TUMOR WITH MITOMYCIN-C-(SMALL);  Surgeon: Hollice Espy, MD;  Location: [facility] ORS;  Service: Urology;  Laterality: N/A;    Home Medications:  Allergies as of 03/10/2017   No Known Allergies     Medication List       Accurate as of 03/10/17  9:58 AM. Always use your most recent med list.          acetaminophen 500 MG tablet Commonly known as:  TYLENOL Take 1,000 mg  by mouth daily as needed for mild pain.   docusate sodium 100 MG capsule Commonly known as:  COLACE Take 1 capsule (100 mg total) by mouth 2 (two) times daily as needed (take to keep stool soft.).   IMODIUM A-D PO Take 1 tablet by mouth daily as needed (diarrhea).   metFORMIN 1000 MG tablet Commonly known as:  GLUCOPHAGE Take 1 tablet (1,000  mg total) by mouth 2 (two) times daily with a meal.   mupirocin ointment 2 % Commonly known as:  BACTROBAN Apply 1 application topically 2 (two) times daily. Up to 2 weeks   oxybutynin 5 MG tablet Commonly known as:  DITROPAN Take 1 tablet (5 mg total) by mouth every 8 (eight) hours as needed for bladder spasms.   sildenafil 20 MG tablet Commonly known as:  REVATIO Take 1 tablet (20 mg total) by mouth as needed. Take 1-5 pills as needed for erectile dysfunction.   triamcinolone ointment 0.5 % Commonly known as:  KENALOG Apply 1 application topically 2 (two) times daily. For 2 weeks for inflammation       Allergies: No Known Allergies  Family History: Family History  Problem Relation Age of Onset  . Heart attack Mother   . Heart disease Mother   . Parkinson's disease Father   . Heart attack Maternal Grandfather   . Heart disease Maternal Grandfather   . Heart attack Maternal Aunt   . Heart attack Maternal Grandmother   . Prostate cancer Neg Hx   . Bladder Cancer Neg Hx   . Kidney cancer Neg Hx     Social History:  reports that he has never smoked. He has never used smokeless tobacco. He reports that he does not drink alcohol or use drugs.  ROS: UROLOGY Frequent Urination?: No Hard to postpone urination?: No Burning/pain with urination?: No Get up at night to urinate?: No Leakage of urine?: No Urine stream starts and stops?: No Trouble starting stream?: No Do you have to strain to urinate?: No Blood in urine?: No Urinary tract infection?: No Sexually transmitted disease?: No Injury to kidneys or bladder?: No Painful intercourse?: No Weak stream?: No Erection problems?: No Penile pain?: No  Gastrointestinal Nausea?: No Vomiting?: No Indigestion/heartburn?: No Diarrhea?: No Constipation?: No  Constitutional Fever: No Night sweats?: No Weight loss?: No Fatigue?: No  Skin Skin rash/lesions?: No Itching?: No  Eyes Blurred vision?: No Double  vision?: No  Ears/Nose/Throat Sore throat?: No Sinus problems?: No  Hematologic/Lymphatic Swollen glands?: No Easy bruising?: No  Cardiovascular Leg swelling?: No Chest pain?: No  Respiratory Cough?: No Shortness of breath?: No  Endocrine Excessive thirst?: No  Musculoskeletal Back pain?: No Joint pain?: No  Neurological Headaches?: No Dizziness?: No  Psychologic Depression?: No Anxiety?: No  Physical Exam: BP (!) 147/83   Pulse 77   Ht 6\' 3"  (1.905 m)   Wt 253 lb (114.8 kg)   BMI 31.62 kg/m   Constitutional:  Alert and oriented, No acute distress.  Presents today accompanied by sister. HEENT: [place] AT, moist mucus membranes.  Trachea midline, no masses. Cardiovascular: No clubbing, cyanosis, or edema. Respiratory: Normal respiratory effort, no increased work of breathing. GI: Abdomen is soft, nontender, nondistended, no abdominal masses GU: Foley catheter in place during clear yellow urine Skin: No rashes, bruises or suspicious lesions. Neurologic: Grossly intact, no focal deficits, moving all 4 extremities. Psychiatric: Normal mood and affect.  Laboratory Data: Lab Results  Component Value Date   WBC 8.9 11/23/2016   HGB  14.3 11/23/2016   HCT 42.4 11/23/2016   MCV 87.8 11/23/2016   PLT 300 11/23/2016    Lab Results  Component Value Date   CREATININE 1.03 11/24/2016    Lab Results  Component Value Date   HGBA1C 6.4 (H) 11/24/2016    Urinalysis Negative. See EPIC.    I have reviewed the labs.  Pertinent Imaging: CT renal stone 11/1016  Assessment & Plan:    1. Malignant neoplasm of lateral wall of urinary bladder (HCC)  - high-grade T1 disease with concomitant CIS  - Reviewed BCG treatment course, possible side effects including BCG sepsis, bladder irritation, worsening of her urinary symptoms  - # 4 of 6 BCG installed today  - Patient was instructed to pour bleach down his/her toilet for the next 6 hours  -  Instructed to call the  office if she should experience fevers greater than 102, chills/rigors, onset of a new cough, night sweats or further bladder spasms or inability to urinate   - RTC in one week for # 5 BCG  - Surveillance protocol also discussed today including cystoscopy every 3 months for at least 2 years and then spread out thereafter  - Urinalysis, Complete  - bcg vaccine injection 81 mg; Instill 3.24 mLs (81 mg total) into the bladder once.  - lidocaine (XYLOCAINE) 2 % jelly 1 application; Place 1 application into the urethra once.  2. Bladder diverticulum HgTa within tic, resected  3. History of hematuria Secondary to #1  4. Ingrown toenail  - Advised the patient to contact his PCP as he is a diabetic and feet infections can worsen quickly   Zara Council, PA-C  [place] [address], [place], [place] [address] [phone]

## 2017-03-10 ENCOUNTER — Ambulatory Visit: Payer: Self-pay | Admitting: Urology

## 2017-03-10 ENCOUNTER — Ambulatory Visit (INDEPENDENT_AMBULATORY_CARE_PROVIDER_SITE_OTHER): Payer: Self-pay | Admitting: Urology

## 2017-03-10 ENCOUNTER — Encounter: Payer: Self-pay | Admitting: Urology

## 2017-03-10 VITALS — BP 147/83 | HR 77 | Ht 75.0 in | Wt 253.0 lb

## 2017-03-10 DIAGNOSIS — N323 Diverticulum of bladder: Secondary | ICD-10-CM

## 2017-03-10 DIAGNOSIS — C672 Malignant neoplasm of lateral wall of bladder: Secondary | ICD-10-CM

## 2017-03-10 DIAGNOSIS — Z87448 Personal history of other diseases of urinary system: Secondary | ICD-10-CM

## 2017-03-10 LAB — MICROSCOPIC EXAMINATION
Bacteria, UA: NONE SEEN
Epithelial Cells (non renal): NONE SEEN /hpf (ref 0–10)
RBC, UA: NONE SEEN /hpf (ref 0–?)

## 2017-03-10 LAB — URINALYSIS, COMPLETE
Bilirubin, UA: NEGATIVE
Glucose, UA: NEGATIVE
Ketones, UA: NEGATIVE
Nitrite, UA: NEGATIVE
Protein, UA: NEGATIVE
Specific Gravity, UA: <1.005 — ABNORMAL LOW (ref 1.005–1.030)
Urobilinogen, Ur: 0.2 mg/dL (ref 0.2–1.0)
pH, UA: 5.5 (ref 5.0–7.5)

## 2017-03-10 MED ORDER — BCG LIVE 50 MG IS SUSR
3.2400 mL | Freq: Once | INTRAVESICAL | Status: AC
  Administered 2017-03-10: 81 mg via INTRAVESICAL

## 2017-03-10 NOTE — Addendum Note (Signed)
Addended by: Toniann Fail C on: 03/10/2017 10:47 AM   Modules accepted: Orders

## 2017-03-10 NOTE — Progress Notes (Signed)
BCG Bladder Instillation  BCG # 4  Due to Bladder Cancer patient is present today for a BCG treatment. Patient was cleaned and prepped in a sterile fashion with betadine and lidocaine 2% jelly was instilled into the urethra.  A 14FR catheter was inserted, urine return was noted 18ml, urine was yellow in color.  91ml of reconstituted BCG was instilled into the bladder. The catheter was then removed. Patient tolerated well, no complications were noted  Preformed by: Toniann Fail, LPN

## 2017-03-14 ENCOUNTER — Encounter: Payer: Self-pay | Admitting: Podiatry

## 2017-03-14 ENCOUNTER — Ambulatory Visit: Payer: No Typology Code available for payment source | Admitting: Podiatry

## 2017-03-14 VITALS — BP 107/79 | HR 89 | Temp 99.0°F | Resp 16

## 2017-03-14 DIAGNOSIS — E1142 Type 2 diabetes mellitus with diabetic polyneuropathy: Secondary | ICD-10-CM

## 2017-03-14 DIAGNOSIS — L03032 Cellulitis of left toe: Secondary | ICD-10-CM

## 2017-03-14 NOTE — Progress Notes (Signed)
   Subjective:    [name], male    DOB: [birth year], 61 y.o.   MRN: [id number]  HPI this patient presents to the office with chief complaint of a painful infected ingrowing toenail outside border big toe left foot.  He says that this infection has been present for approximately 3 weeks.  He says that he is diabetic with neuropathy and he is concerned because of the infection. He gives a history of having injured his toenail and it has regrown and grown into the outside border big toe left foot.  He says he has provided no self treatment nor sought any professional help.  He presents the office for evaluation of this infected nail.  He does give a history of previous surgery years years ago to remove the outside border of the big toe nail, left foot. Patient admits that he is under treatment for bladder cancer and is in the middle of his 6 treatment.    Review of Systems  All other systems reviewed and are negative.      Objective:   Physical Exam General Appearance  Alert, conversant and in no acute stress.  Vascular  Dorsalis pedis and posterior pulses are palpable  bilaterally.  Capillary return is within normal limits  Bilaterally. Temperature is within normal limits  Bilaterally  Neurologic  Senn-Weinstein monofilament wire test absent   bilaterally. Muscle power  Within normal limits bilaterally.  Nails marked incurvation noted along the lateral border of the left great toe.  The nail appears to be growing into the distal lateral nail groove.  There is redness around the lateral border and the proximal nail fold left great toe.  Desquamation is noted around the infected nail.  Orthopedic  No limitations of motion of motion feet bilaterally.  No crepitus or effusions noted.  No bony pathology or digital deformities noted.  Skin  normotropic skin with no porokeratosis noted bilaterally.  No signs of infections or ulcers noted.          Assessment & Plan:    Paronychia lateral border left great toenail..Treatment options and alternatives discussed.  Recommended an incision and drainage and patient agreed.  Left hallux  was prepped with alcohol and a 3cc. of  2% lidocaine plain was administered in a digital block fashion.  The toe was then prepped with betadine solution .  The offending nail border was then excised and all necrotic tissue was resected.  The area was then cleansed  and antibiotic ointment and a dry sterile dressing was applied.  The patient was dispensed instructions for aftercare.  Due to the fact that he is under cancer treatment for urinary bladder infection. No antibiotics were prescribed.  RTC 1 week.   Gardiner Barefoot DPM

## 2017-03-16 NOTE — Progress Notes (Signed)
03/17/2017 10:59 AM   [name] 1955-06-05 [id number]  Referring provider: [name], DO [address] [place], [place] [address]  Chief Complaint  Patient presents with  . 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  This will be 5 of 6.  Background history  61 year old male initially presenting with gross hematuria found to have bladder tumor as well as a tumor within the bladder diverticulum underwent a TURBT on 12/05/2016.  Intraoperatively, clot was removed from the bladder. Upon inspection of his bladder, there are 2 discrete tumors in the bladder, one measuring 2.5 cm in the left lateral wall just superior to the bladder diverticulum highly suspicious for an invasive tumor. In addition to this, there was a velvety patch just below the diverticulum as well as a papillary lesion within the diverticulum itself.  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  Additional workup with noncontrast CT abdomen pelvis as well as bilateral retrograde pyelograms were negative for any upper tract pathology.  Re-TURBT performed on 01/18/2017 noted intraoperative findings of velvety erythematous plaque with a fine papillary change measuring up to 2 cm involving the left lateral wall of the bladder just beyond the UO and extending beyond small bladder diverticulum. Areas of necrosis from previous TURBT appreciated within this area. No obvious active residual tumor within small bladder diverticulum, small patches of necrosis from previous resection seen. Additional erythematous patch on left anterior wall bladder concerning for a second focus of CIS.  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  Today, he is not experiencing dysuria, suprapubic pain or gross hematuria.  He does not have a  cough.  He has not had recent fevers, chills, nausea or vomiting.  He was able to hold instillation for 2 hours after his last treatment. His UA today is negative.    PMH: Past Medical History:  Diagnosis Date  . Cancer [facility])    Bladder Cancer  . Erectile dysfunction associated with type 2 diabetes mellitus ([place]) 05/26/2016  . Gross hematuria 11/16/2016  . Hypertension 06/11/2015  . Type 2 diabetes mellitus with diabetic polyneuropathy, without long-term current use of insulin ([place]) 06/11/2015    Surgical History: Past Surgical History:  Procedure Laterality Date  . boil lanceted     hand  . CYSTOSCOPY W/ RETROGRADES Bilateral 12/06/2016   Procedure: CYSTOSCOPY WITH RETROGRADE PYELOGRAM;  Surgeon: Hollice Espy, MD;  Location: [facility] ORS;  Service: Urology;  Laterality: Bilateral;  . ingrown  Bilateral    ingrown toenail  . TRANSURETHRAL RESECTION OF BLADDER TUMOR N/A 12/06/2016   Procedure: TRANSURETHRAL RESECTION OF BLADDER TUMOR (TURBT) (2-5cm) CLOT EVACUATION;  Surgeon: Hollice Espy, MD;  Location: [facility] ORS;  Service: Urology;  Laterality: N/A;  . TRANSURETHRAL RESECTION OF BLADDER TUMOR WITH MITOMYCIN-C N/A 01/18/2017   Procedure: TRANSURETHRAL RESECTION OF BLADDER TUMOR WITH MITOMYCIN-C-(SMALL);  Surgeon: Hollice Espy, MD;  Location: [facility] ORS;  Service: Urology;  Laterality: N/A;    Home Medications:  Allergies as of 03/17/2017   No Known Allergies     Medication List       Accurate as of 03/17/17 10:59 AM. Always use your most recent med list.          acetaminophen 500 MG tablet Commonly known as:  TYLENOL Take 1,000 mg by  mouth daily as needed for mild pain.   metFORMIN 1000 MG tablet Commonly known as:  GLUCOPHAGE Take 1 tablet (1,000 mg total) by mouth 2 (two) times daily with a meal.   mupirocin ointment 2 % Commonly known as:  BACTROBAN Apply 1 application topically 2 (two) times daily. Up to 2 weeks   oxybutynin 5 MG tablet Commonly known as:   DITROPAN Take 1 tablet (5 mg total) by mouth every 8 (eight) hours as needed for bladder spasms.   sildenafil 20 MG tablet Commonly known as:  REVATIO Take 1 tablet (20 mg total) by mouth as needed. Take 1-5 pills as needed for erectile dysfunction.   triamcinolone ointment 0.5 % Commonly known as:  KENALOG Apply 1 application topically 2 (two) times daily. For 2 weeks for inflammation       Allergies: No Known Allergies  Family History: Family History  Problem Relation Age of Onset  . Heart attack Mother   . Heart disease Mother   . Parkinson's disease Father   . Heart attack Maternal Grandfather   . Heart disease Maternal Grandfather   . Heart attack Maternal Aunt   . Heart attack Maternal Grandmother   . Prostate cancer Neg Hx   . Bladder Cancer Neg Hx   . Kidney cancer Neg Hx     Social History:  reports that he has never smoked. He has never used smokeless tobacco. He reports that he does not drink alcohol or use drugs.  ROS: UROLOGY Frequent Urination?: No Hard to postpone urination?: No Burning/pain with urination?: No Get up at night to urinate?: No Leakage of urine?: No Urine stream starts and stops?: No Trouble starting stream?: No Do you have to strain to urinate?: No Blood in urine?: No Urinary tract infection?: No Sexually transmitted disease?: No Injury to kidneys or bladder?: No Painful intercourse?: No Weak stream?: No Erection problems?: No Penile pain?: No  Gastrointestinal Nausea?: No Vomiting?: No Indigestion/heartburn?: No Diarrhea?: No Constipation?: No  Constitutional Fever: No Night sweats?: No Weight loss?: No Fatigue?: No  Skin Skin rash/lesions?: No Itching?: No  Eyes Blurred vision?: No Double vision?: No  Ears/Nose/Throat Sore throat?: No Sinus problems?: No  Hematologic/Lymphatic Swollen glands?: No Easy bruising?: No  Cardiovascular Leg swelling?: No Chest pain?: No  Respiratory Cough?: No Shortness  of breath?: No  Endocrine Excessive thirst?: No  Musculoskeletal Back pain?: No Joint pain?: No  Neurological Headaches?: No Dizziness?: No  Psychologic Depression?: No Anxiety?: No  Physical Exam: BP (!) 141/83   Pulse 83   Ht 6\' 3"  (1.905 m)   Wt 255 lb (115.7 kg)   BMI 31.87 kg/m   Constitutional:  Alert and oriented, No acute distress.  Presents today accompanied by sister. HEENT: [place] AT, moist mucus membranes.  Trachea midline, no masses. Cardiovascular: No clubbing, cyanosis, or edema. Respiratory: Normal respiratory effort, no increased work of breathing. GI: Abdomen is soft, nontender, nondistended, no abdominal masses GU: Foley catheter in place during clear yellow urine Skin: No rashes, bruises or suspicious lesions. Neurologic: Grossly intact, no focal deficits, moving all 4 extremities. Psychiatric: Normal mood and affect.  Laboratory Data: Lab Results  Component Value Date   WBC 8.9 11/23/2016   HGB 14.3 11/23/2016   HCT 42.4 11/23/2016   MCV 87.8 11/23/2016   PLT 300 11/23/2016    Lab Results  Component Value Date   CREATININE 1.03 11/24/2016    Lab Results  Component Value Date   HGBA1C 6.4 (H) 11/24/2016  Urinalysis Negative. See EPIC.    I have reviewed the labs.  Pertinent Imaging: CT renal stone 11/1016  Assessment & Plan:    1. Malignant neoplasm of lateral wall of urinary bladder (HCC)  - high-grade T1 disease with concomitant CIS  - Reviewed BCG treatment course, possible side effects including BCG sepsis, bladder irritation, worsening of her urinary symptoms  - # 5 of 6 BCG installed today  - Patient was instructed to pour bleach down his/her toilet for the next 6 hours  -  Instructed to call the office if she should experience fevers greater than 102, chills/rigors, onset of a new cough, night sweats or further bladder spasms or inability to urinate   - RTC in one week for # 6 BCG  - Surveillance protocol also discussed  today including cystoscopy every 3 months for at least 2 years and then spread out thereafter  - Urinalysis, Complete  - bcg vaccine injection 81 mg; Instill 3.24 mLs (81 mg total) into the bladder once.  - lidocaine (XYLOCAINE) 2 % jelly 1 application; Place 1 application into the urethra once.  2. Bladder diverticulum HgTa within tic, resected  3. History of hematuria Secondary to #1    Zara Council, [facility]  [place] [address], [place], [place] [address] [phone]

## 2017-03-17 ENCOUNTER — Encounter: Payer: Self-pay | Admitting: Urology

## 2017-03-17 ENCOUNTER — Ambulatory Visit (INDEPENDENT_AMBULATORY_CARE_PROVIDER_SITE_OTHER): Payer: Self-pay | Admitting: Urology

## 2017-03-17 ENCOUNTER — Ambulatory Visit: Payer: Self-pay | Admitting: Urology

## 2017-03-17 VITALS — BP 141/83 | HR 83 | Ht 75.0 in | Wt 255.0 lb

## 2017-03-17 DIAGNOSIS — C672 Malignant neoplasm of lateral wall of bladder: Secondary | ICD-10-CM

## 2017-03-17 DIAGNOSIS — N323 Diverticulum of bladder: Secondary | ICD-10-CM

## 2017-03-17 DIAGNOSIS — Z87448 Personal history of other diseases of urinary system: Secondary | ICD-10-CM

## 2017-03-17 LAB — URINALYSIS, COMPLETE
Bilirubin, UA: NEGATIVE
Glucose, UA: NEGATIVE
Ketones, UA: NEGATIVE
Nitrite, UA: NEGATIVE
Protein, UA: NEGATIVE
RBC, UA: NEGATIVE
Specific Gravity, UA: 1.01 (ref 1.005–1.030)
Urobilinogen, Ur: 0.2 mg/dL (ref 0.2–1.0)
pH, UA: 7 (ref 5.0–7.5)

## 2017-03-17 LAB — MICROSCOPIC EXAMINATION
Bacteria, UA: NONE SEEN
Epithelial Cells (non renal): NONE SEEN /hpf (ref 0–10)
RBC, UA: NONE SEEN /hpf (ref 0–?)

## 2017-03-17 MED ORDER — BCG LIVE 50 MG IS SUSR
3.2400 mL | Freq: Once | INTRAVESICAL | Status: AC
  Administered 2017-03-17: 81 mg via INTRAVESICAL

## 2017-03-17 NOTE — Addendum Note (Signed)
Addended by: Toniann Fail C on: 03/17/2017 11:56 AM   Modules accepted: Orders

## 2017-03-17 NOTE — Progress Notes (Signed)
BCG Bladder Instillation  BCG # 5  Due to Bladder Cancer patient is present today for a BCG treatment. Patient was cleaned and prepped in a sterile fashion with betadine and lidocaine 2% jelly was instilled into the urethra.  A 14FR catheter was inserted, urine return was noted 10ml, urine was yellow in color.  27ml of reconstituted BCG was instilled into the bladder. The catheter was then removed. Patient tolerated well, no complications were noted  Preformed by: Toniann Fail, LPN

## 2017-03-23 NOTE — Progress Notes (Signed)
03/24/2017 10:31 AM   Richard Oconnell Jan 21, 1956 [id number]  Referring provider: [name], DO [address] [place], [place] [address]  Chief Complaint  Patient presents with  . BCG # 6    HPI: 61 yo WM with HgTa TCC who presents today to undergo an induction course of intravesical BCG.  This will be 6 of 6.  Background history  61 year old male initially presenting with gross hematuria found to have bladder tumor as well as a tumor within the bladder diverticulum underwent a TURBT on 12/05/2016.  Intraoperatively, clot was removed from the bladder. Upon inspection of his bladder, there are 2 discrete tumors in the bladder, one measuring 2.5 cm in the left lateral wall just superior to the bladder diverticulum highly suspicious for an invasive tumor. In addition to this, there was a velvety patch just below the diverticulum as well as a papillary lesion within the diverticulum itself.  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  Additional workup with noncontrast CT abdomen pelvis as well as bilateral retrograde pyelograms were negative for any upper tract pathology.  Re-TURBT performed on 01/18/2017 noted intraoperative findings of velvety erythematous plaque with a fine papillary change measuring up to 2 cm involving the left lateral wall of the bladder just beyond the UO and extending beyond small bladder diverticulum. Areas of necrosis from previous TURBT appreciated within this area. No obvious active residual tumor within small bladder diverticulum, small patches of necrosis from previous resection seen. Additional erythematous patch on left anterior wall bladder concerning for a second focus of CIS.  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  Today, he is not experiencing dysuria, suprapubic pain or gross hematuria.  He does not have a cough.  He  has not had recent fevers, chills, nausea or vomiting.  He was able to hold instillation for 2 hours after his last treatment. His UA today is negative.    PMH: Past Medical History:  Diagnosis Date  . Cancer [facility])    Bladder Cancer  . Erectile dysfunction associated with type 2 diabetes mellitus ([place]) 05/26/2016  . Gross hematuria 11/16/2016  . Hypertension 06/11/2015  . Type 2 diabetes mellitus with diabetic polyneuropathy, without long-term current use of insulin ([place]) 06/11/2015    Surgical History: Past Surgical History:  Procedure Laterality Date  . boil lanceted     hand  . ingrown  Bilateral    ingrown toenail    Home Medications:  Allergies as of 03/24/2017   No Known Allergies     Medication List        Accurate as of 03/24/17 10:31 AM. Always use your most recent med list.          acetaminophen 500 MG tablet Commonly known as:  TYLENOL Take 1,000 mg by mouth daily as needed for mild pain.   metFORMIN 1000 MG tablet Commonly known as:  GLUCOPHAGE Take 1 tablet (1,000 mg total) by mouth 2 (two) times daily with a meal.       Allergies: No Known Allergies  Family History: Family History  Problem Relation Age of Onset  . Heart attack Mother   . Heart disease Mother   . Parkinson's disease Father   . Heart attack Maternal Grandfather   . Heart disease Maternal Grandfather   . Heart attack Maternal Aunt   . Heart attack Maternal Grandmother   . Prostate  cancer Neg Hx   . Bladder Cancer Neg Hx   . Kidney cancer Neg Hx     Social History:  reports that  has never smoked. he has never used smokeless tobacco. He reports that he does not drink alcohol or use drugs.  ROS: UROLOGY Frequent Urination?: No Hard to postpone urination?: No Burning/pain with urination?: No Get up at night to urinate?: No Leakage of urine?: No Urine stream starts and stops?: No Trouble starting stream?: No Do you have to strain to urinate?: No Blood in urine?: No Urinary  tract infection?: No Sexually transmitted disease?: No Injury to kidneys or bladder?: No Painful intercourse?: No Weak stream?: No Erection problems?: No Penile pain?: No  Gastrointestinal Nausea?: No Vomiting?: No Indigestion/heartburn?: No Diarrhea?: No Constipation?: No  Constitutional Fever: No Night sweats?: No Weight loss?: No Fatigue?: No  Skin Skin rash/lesions?: No Itching?: No  Eyes Blurred vision?: No Double vision?: No  Ears/Nose/Throat Sore throat?: No Sinus problems?: No  Hematologic/Lymphatic Swollen glands?: No Easy bruising?: No  Cardiovascular Leg swelling?: No Chest pain?: No  Respiratory Cough?: No Shortness of breath?: No  Endocrine Excessive thirst?: No  Musculoskeletal Back pain?: No Joint pain?: No  Neurological Headaches?: No Dizziness?: No  Psychologic Depression?: No Anxiety?: No  Physical Exam: BP (!) 170/100   Pulse 90   Ht 6\' 3"  (1.905 m)   Wt 263 lb 4.8 oz (119.4 kg)   BMI 32.91 kg/m   Constitutional:  Alert and oriented, No acute distress.  Presents today accompanied by sister. HEENT: [place] AT, moist mucus membranes.  Trachea midline, no masses. Cardiovascular: No clubbing, cyanosis, or edema. Respiratory: Normal respiratory effort, no increased work of breathing. GI: Abdomen is soft, nontender, nondistended, no abdominal masses GU: Foley catheter in place during clear yellow urine Skin: No rashes, bruises or suspicious lesions. Neurologic: Grossly intact, no focal deficits, moving all 4 extremities. Psychiatric: Normal mood and affect.  Laboratory Data: Lab Results  Component Value Date   WBC 8.9 11/23/2016   HGB 14.3 11/23/2016   HCT 42.4 11/23/2016   MCV 87.8 11/23/2016   PLT 300 11/23/2016    Lab Results  Component Value Date   CREATININE 1.03 11/24/2016    Lab Results  Component Value Date   HGBA1C 6.4 (H) 11/24/2016    Urinalysis Negative. See EPIC.    I have reviewed the  labs.  Pertinent Imaging: CT renal stone 11/1016  Assessment & Plan:    1. Malignant neoplasm of lateral wall of urinary bladder (HCC)  - high-grade T1 disease with concomitant CIS  - Reviewed BCG treatment course, possible side effects including BCG sepsis, bladder irritation, worsening of her urinary symptoms  - # 6 of 6 BCG installed today  - Patient was instructed to pour bleach down his/her toilet for the next 6 hours  -  Instructed to call the office if she should experience fevers greater than 102, chills/rigors, onset of a new cough, night sweats or further bladder spasms or inability to urinate   - RTC on 06/24/2017 for surveillance cystoscopy with Dr. Erlene Quan  - Surveillance protocol also discussed today including cystoscopy every 3 months for at least 2 years and then spread out thereafter  - Urinalysis, Complete  - bcg vaccine injection 81 mg; Instill 3.24 mLs (81 mg total) into the bladder once.  - lidocaine (XYLOCAINE) 2 % jelly 1 application; Place 1 application into the urethra once.  2. Bladder diverticulum HgTa within tic, resected  3. History  of hematuria Secondary to #1    Zara Council, [facility]  [place] [address] [place], [place] [address] [phone]

## 2017-03-24 ENCOUNTER — Ambulatory Visit: Payer: Self-pay | Admitting: Urology

## 2017-03-24 ENCOUNTER — Encounter: Payer: Self-pay | Admitting: Urology

## 2017-03-24 ENCOUNTER — Ambulatory Visit (INDEPENDENT_AMBULATORY_CARE_PROVIDER_SITE_OTHER): Payer: Self-pay | Admitting: Urology

## 2017-03-24 VITALS — BP 170/100 | HR 90 | Ht 75.0 in | Wt 263.3 lb

## 2017-03-24 DIAGNOSIS — C672 Malignant neoplasm of lateral wall of bladder: Secondary | ICD-10-CM

## 2017-03-24 DIAGNOSIS — N323 Diverticulum of bladder: Secondary | ICD-10-CM

## 2017-03-24 DIAGNOSIS — Z87448 Personal history of other diseases of urinary system: Secondary | ICD-10-CM

## 2017-03-24 LAB — URINALYSIS, COMPLETE
Bilirubin, UA: NEGATIVE
Glucose, UA: NEGATIVE
Ketones, UA: NEGATIVE
Leukocytes, UA: NEGATIVE
Nitrite, UA: NEGATIVE
Protein, UA: NEGATIVE
Specific Gravity, UA: 1.01 (ref 1.005–1.030)
Urobilinogen, Ur: 0.2 mg/dL (ref 0.2–1.0)
pH, UA: 5.5 (ref 5.0–7.5)

## 2017-03-24 LAB — MICROSCOPIC EXAMINATION
Bacteria, UA: NONE SEEN
Epithelial Cells (non renal): NONE SEEN /hpf (ref 0–10)

## 2017-03-24 MED ORDER — BCG LIVE 50 MG IS SUSR
3.2400 mL | Freq: Once | INTRAVESICAL | Status: AC
  Administered 2017-03-24: 81 mg via INTRAVESICAL

## 2017-05-27 ENCOUNTER — Other Ambulatory Visit: Payer: Self-pay | Admitting: Family Medicine

## 2017-05-27 DIAGNOSIS — E1142 Type 2 diabetes mellitus with diabetic polyneuropathy: Secondary | ICD-10-CM

## 2017-06-24 ENCOUNTER — Other Ambulatory Visit: Payer: Self-pay | Admitting: Radiology

## 2017-06-24 ENCOUNTER — Encounter: Payer: Self-pay | Admitting: Urology

## 2017-06-24 ENCOUNTER — Ambulatory Visit: Payer: BLUE CROSS/BLUE SHIELD | Admitting: Urology

## 2017-06-24 VITALS — BP 134/84 | HR 75 | Ht 75.0 in | Wt 260.0 lb

## 2017-06-24 DIAGNOSIS — Z8551 Personal history of malignant neoplasm of bladder: Secondary | ICD-10-CM | POA: Diagnosis not present

## 2017-06-24 DIAGNOSIS — C679 Malignant neoplasm of bladder, unspecified: Secondary | ICD-10-CM

## 2017-06-24 DIAGNOSIS — N3289 Other specified disorders of bladder: Secondary | ICD-10-CM

## 2017-06-24 LAB — URINALYSIS, COMPLETE
Bilirubin, UA: NEGATIVE
Glucose, UA: NEGATIVE
Ketones, UA: NEGATIVE
Leukocytes, UA: NEGATIVE
Nitrite, UA: NEGATIVE
Protein, UA: NEGATIVE
RBC, UA: NEGATIVE
Specific Gravity, UA: 1.015 (ref 1.005–1.030)
Urobilinogen, Ur: 0.2 mg/dL (ref 0.2–1.0)
pH, UA: 5.5 (ref 5.0–7.5)

## 2017-06-24 NOTE — H&P (View-Only) (Signed)
   06/24/17  CC:  Chief Complaint  Patient presents with  . Cysto    HPI: 62 year old male with a history of bladder cancer, CIS/ HgT1 along with Hg TA with in a bladder diverticulum s/p TURBT x 2 in 11/2016 and 01/2017.  He is now status post 6 weeks of induction BCG cystoscopy following induction.  He denies any urinary symptoms today including dysuria or gross hematuria.  He is overall been doing well.  Blood pressure 134/84, pulse 75, height 6\' 3"  (1.905 m), weight 260 lb (117.9 kg). NED. A&Ox3.   No respiratory distress   Abd soft, NT, ND Normal phallus with bilateral descended testicles  Cystoscopy Procedure Note  Patient identification was confirmed, informed consent was obtained, and patient was prepped using Betadine solution.  Lidocaine jelly was administered per urethral meatus.    Preoperative abx where received prior to procedure.     Pre-Procedure: - Inspection reveals a normal caliber ureteral meatus.  Procedure: The flexible cystoscope was introduced without difficulty - No urethral strictures/lesions are present. - Normal prostate - Normal bladder neck - Bilateral ureteral orifices identified - Bladder mucosa  reveals small flat erythematous lesion on posterior bladder either early recurrence or CIS.  Additionally, there is erythema around his previous TUR site and scar concerning for residual CIS.  Left lateral diverticulum unable to be scoped today due to narrow opening but no fungating papillary tumor from the opening.  Additionally, there is some subtle mucosal change along the left hemitrigone surrounding the left UO extending to the bladder neck concerning for recurrence. - No bladder stones - Mild trabeculation    Post-Procedure: - Patient tolerated the procedure well  Assessment/ Plan:  1. Erythematous bladder mucosa Three suspicious areas in the bladder, one in the posterior wall and one adjacent to his previous surgical resection scar  concerning for either early recurrence versus residual disease  Given the suspicious findings, I recommended returning to the operating room for bladder biopsy/fulguration of the lesions with bilateral retrograde pyelogram for further diagnostic workup.  Risk and benefits of this are discussed in detail.  All questions answered.  Preop urine culture today although UA does not appear to be suspicious whatsoever.  Urine cytology sent  2. History of bladder cancer As above - Urinalysis, Complete - CULTURE, URINE COMPREHENSIVE   Hollice Espy, MD

## 2017-06-24 NOTE — Progress Notes (Signed)
   06/24/17  CC:  Chief Complaint  Patient presents with  . Cysto    HPI: 62 year old male with a history of bladder cancer, CIS/ HgT1 along with Hg TA with in a bladder diverticulum s/p TURBT x 2 in 11/2016 and 01/2017.  He is now status post 6 weeks of induction BCG cystoscopy following induction.  He denies any urinary symptoms today including dysuria or gross hematuria.  He is overall been doing well.  Blood pressure 134/84, pulse 75, height 6\' 3"  (1.905 m), weight 260 lb (117.9 kg). NED. A&Ox3.   No respiratory distress   Abd soft, NT, ND Normal phallus with bilateral descended testicles  Cystoscopy Procedure Note  Patient identification was confirmed, informed consent was obtained, and patient was prepped using Betadine solution.  Lidocaine jelly was administered per urethral meatus.    Preoperative abx where received prior to procedure.     Pre-Procedure: - Inspection reveals a normal caliber ureteral meatus.  Procedure: The flexible cystoscope was introduced without difficulty - No urethral strictures/lesions are present. - Normal prostate - Normal bladder neck - Bilateral ureteral orifices identified - Bladder mucosa  reveals small flat erythematous lesion on posterior bladder either early recurrence or CIS.  Additionally, there is erythema around his previous TUR site and scar concerning for residual CIS.  Left lateral diverticulum unable to be scoped today due to narrow opening but no fungating papillary tumor from the opening.  Additionally, there is some subtle mucosal change along the left hemitrigone surrounding the left UO extending to the bladder neck concerning for recurrence. - No bladder stones - Mild trabeculation    Post-Procedure: - Patient tolerated the procedure well  Assessment/ Plan:  1. Erythematous bladder mucosa Three suspicious areas in the bladder, one in the posterior wall and one adjacent to his previous surgical resection scar  concerning for either early recurrence versus residual disease  Given the suspicious findings, I recommended returning to the operating room for bladder biopsy/fulguration of the lesions with bilateral retrograde pyelogram for further diagnostic workup.  Risk and benefits of this are discussed in detail.  All questions answered.  Preop urine culture today although UA does not appear to be suspicious whatsoever.  Urine cytology sent  2. History of bladder cancer As above - Urinalysis, Complete - CULTURE, URINE COMPREHENSIVE   Hollice Espy, MD

## 2017-06-26 MED ORDER — CEFAZOLIN SODIUM-DEXTROSE 2-4 GM/100ML-% IV SOLN
2.0000 g | INTRAVENOUS | Status: AC
  Administered 2017-06-27: 2 g via INTRAVENOUS

## 2017-06-27 ENCOUNTER — Other Ambulatory Visit: Payer: Self-pay

## 2017-06-27 ENCOUNTER — Ambulatory Visit: Payer: BLUE CROSS/BLUE SHIELD | Admitting: Anesthesiology

## 2017-06-27 ENCOUNTER — Encounter
Admission: RE | Disposition: A | Discharge status: Home / Self Care | Payer: Self-pay | Source: Ambulatory Visit | Attending: Urology

## 2017-06-27 ENCOUNTER — Ambulatory Visit
Admission: RE | Admit: 2017-06-27 | Discharge: 2017-06-27 | Disposition: A | Discharge status: Home / Self Care | Payer: BLUE CROSS/BLUE SHIELD | Source: Ambulatory Visit | Attending: Urology | Admitting: Urology

## 2017-06-27 ENCOUNTER — Encounter: Payer: Self-pay | Admitting: *Deleted

## 2017-06-27 DIAGNOSIS — Z79899 Other long term (current) drug therapy: Secondary | ICD-10-CM | POA: Diagnosis not present

## 2017-06-27 DIAGNOSIS — C675 Malignant neoplasm of bladder neck: Secondary | ICD-10-CM | POA: Diagnosis not present

## 2017-06-27 DIAGNOSIS — E1142 Type 2 diabetes mellitus with diabetic polyneuropathy: Secondary | ICD-10-CM | POA: Insufficient documentation

## 2017-06-27 DIAGNOSIS — N529 Male erectile dysfunction, unspecified: Secondary | ICD-10-CM | POA: Insufficient documentation

## 2017-06-27 DIAGNOSIS — N3289 Other specified disorders of bladder: Secondary | ICD-10-CM | POA: Diagnosis not present

## 2017-06-27 DIAGNOSIS — D090 Carcinoma in situ of bladder: Secondary | ICD-10-CM | POA: Insufficient documentation

## 2017-06-27 DIAGNOSIS — N3090 Cystitis, unspecified without hematuria: Secondary | ICD-10-CM | POA: Diagnosis not present

## 2017-06-27 DIAGNOSIS — D494 Neoplasm of unspecified behavior of bladder: Secondary | ICD-10-CM | POA: Diagnosis present

## 2017-06-27 DIAGNOSIS — Z8551 Personal history of malignant neoplasm of bladder: Secondary | ICD-10-CM | POA: Diagnosis not present

## 2017-06-27 DIAGNOSIS — N323 Diverticulum of bladder: Secondary | ICD-10-CM | POA: Diagnosis not present

## 2017-06-27 DIAGNOSIS — C679 Malignant neoplasm of bladder, unspecified: Secondary | ICD-10-CM

## 2017-06-27 DIAGNOSIS — I10 Essential (primary) hypertension: Secondary | ICD-10-CM | POA: Insufficient documentation

## 2017-06-27 HISTORY — PX: TRANSURETHRAL RESECTION OF BLADDER TUMOR: SHX2575

## 2017-06-27 HISTORY — PX: CYSTOSCOPY W/ RETROGRADES: SHX1426

## 2017-06-27 HISTORY — PX: CYSTOSCOPY WITH BIOPSY: SHX5122

## 2017-06-27 LAB — GLUCOSE, CAPILLARY: Glucose-Capillary: 128 mg/dL — ABNORMAL HIGH (ref 65–99)

## 2017-06-27 SURGERY — CYSTOSCOPY, WITH BIOPSY
Anesthesia: General | Site: Ureter | Wound class: Clean Contaminated

## 2017-06-27 MED ORDER — DEXAMETHASONE SODIUM PHOSPHATE 10 MG/ML IJ SOLN
INTRAMUSCULAR | Status: DC | PRN
  Administered 2017-06-27: 4 mg via INTRAVENOUS

## 2017-06-27 MED ORDER — SODIUM CHLORIDE 0.9 % IV SOLN
INTRAVENOUS | Status: DC
  Administered 2017-06-27: 13:00:00 via INTRAVENOUS

## 2017-06-27 MED ORDER — FAMOTIDINE 20 MG PO TABS
20.0000 mg | ORAL_TABLET | Freq: Once | ORAL | Status: AC
  Administered 2017-06-27: 20 mg via ORAL

## 2017-06-27 MED ORDER — HYDROCODONE-ACETAMINOPHEN 5-325 MG PO TABS: 1.0000 | ORAL_TABLET | Freq: Four times a day (QID) | ORAL | 0 refills | Status: DC | PRN

## 2017-06-27 MED ORDER — LIDOCAINE HCL (CARDIAC) 20 MG/ML IV SOLN
INTRAVENOUS | Status: DC | PRN
  Administered 2017-06-27: 100 mg via INTRAVENOUS

## 2017-06-27 MED ORDER — FENTANYL CITRATE (PF) 100 MCG/2ML IJ SOLN: 25.0000 ug | INTRAMUSCULAR | Status: DC | PRN

## 2017-06-27 MED ORDER — FENTANYL CITRATE (PF) 100 MCG/2ML IJ SOLN
INTRAMUSCULAR | Status: DC | PRN
  Administered 2017-06-27 (×4): 25 ug via INTRAVENOUS

## 2017-06-27 MED ORDER — PROPOFOL 10 MG/ML IV BOLUS
INTRAVENOUS | Status: DC | PRN
  Administered 2017-06-27: 200 mg via INTRAVENOUS

## 2017-06-27 MED ORDER — FAMOTIDINE 20 MG PO TABS
ORAL_TABLET | ORAL | Status: AC
  Administered 2017-06-27: 20 mg via ORAL
  Filled 2017-06-27: qty 1

## 2017-06-27 MED ORDER — FENTANYL CITRATE (PF) 100 MCG/2ML IJ SOLN
INTRAMUSCULAR | Status: AC
  Filled 2017-06-27: qty 2

## 2017-06-27 MED ORDER — OXYBUTYNIN CHLORIDE 5 MG PO TABS: 5.0000 mg | ORAL_TABLET | Freq: Three times a day (TID) | ORAL | 0 refills | Status: DC | PRN

## 2017-06-27 MED ORDER — IOTHALAMATE MEGLUMINE 43 % IV SOLN
INTRAVENOUS | Status: DC | PRN
  Administered 2017-06-27: 15 mL via URETHRAL

## 2017-06-27 MED ORDER — ONDANSETRON HCL 4 MG/2ML IJ SOLN: 4.0000 mg | Freq: Once | INTRAMUSCULAR | Status: DC | PRN

## 2017-06-27 MED ORDER — ONDANSETRON HCL 4 MG/2ML IJ SOLN
INTRAMUSCULAR | Status: DC | PRN
  Administered 2017-06-27: 4 mg via INTRAVENOUS

## 2017-06-27 MED ORDER — MIDAZOLAM HCL 2 MG/2ML IJ SOLN
INTRAMUSCULAR | Status: AC
  Filled 2017-06-27: qty 2

## 2017-06-27 MED ORDER — PROPOFOL 10 MG/ML IV BOLUS
INTRAVENOUS | Status: AC
  Filled 2017-06-27: qty 20

## 2017-06-27 MED ORDER — CEFAZOLIN SODIUM-DEXTROSE 2-4 GM/100ML-% IV SOLN
INTRAVENOUS | Status: AC
  Filled 2017-06-27: qty 100

## 2017-06-27 MED ORDER — ONDANSETRON HCL 4 MG/2ML IJ SOLN
INTRAMUSCULAR | Status: AC
  Filled 2017-06-27: qty 2

## 2017-06-27 MED ORDER — LIDOCAINE HCL (PF) 2 % IJ SOLN
INTRAMUSCULAR | Status: AC
  Filled 2017-06-27: qty 10

## 2017-06-27 MED ORDER — DEXAMETHASONE SODIUM PHOSPHATE 10 MG/ML IJ SOLN
INTRAMUSCULAR | Status: AC
  Filled 2017-06-27: qty 1

## 2017-06-27 MED ORDER — MIDAZOLAM HCL 2 MG/2ML IJ SOLN
INTRAMUSCULAR | Status: DC | PRN
  Administered 2017-06-27: 2 mg via INTRAVENOUS

## 2017-06-27 SURGICAL SUPPLY — 24 items
BAG DRAIN CYSTO-URO LG1000N (MISCELLANEOUS) ×3 IMPLANT
BRUSH SCRUB EZ  4% CHG (MISCELLANEOUS) ×1
BRUSH SCRUB EZ 4% CHG (MISCELLANEOUS) ×2 IMPLANT
CATH URETL 5X70 OPEN END (CATHETERS) ×3 IMPLANT
CONRAY 43 FOR UROLOGY 50M (MISCELLANEOUS) ×3 IMPLANT
DRAPE UTILITY 15X26 TOWEL STRL (DRAPES) ×3 IMPLANT
DRSG TELFA 4X3 1S NADH ST (GAUZE/BANDAGES/DRESSINGS) ×3 IMPLANT
ELECT REM PT RETURN 9FT ADLT (ELECTROSURGICAL) ×3
ELECTRODE REM PT RTRN 9FT ADLT (ELECTROSURGICAL) ×2 IMPLANT
GLOVE BIO SURGEON STRL SZ 6.5 (GLOVE) ×3 IMPLANT
GOWN STRL REUS W/ TWL LRG LVL3 (GOWN DISPOSABLE) ×4 IMPLANT
GOWN STRL REUS W/TWL LRG LVL3 (GOWN DISPOSABLE) ×2
KIT TURNOVER CYSTO (KITS) ×3 IMPLANT
NDL SAFETY ECLIPSE 18X1.5 (NEEDLE) ×2 IMPLANT
NEEDLE HYPO 18GX1.5 SHARP (NEEDLE) ×1
PACK CYSTO AR (MISCELLANEOUS) ×3 IMPLANT
SENSORWIRE 0.038 NOT ANGLED (WIRE) ×3
SET CYSTO W/LG BORE CLAMP LF (SET/KITS/TRAYS/PACK) ×3 IMPLANT
SOL .9 NS 3000ML IRR  AL (IV SOLUTION) ×1
SOL .9 NS 3000ML IRR UROMATIC (IV SOLUTION) ×2 IMPLANT
SURGILUBE 2OZ TUBE FLIPTOP (MISCELLANEOUS) ×3 IMPLANT
WATER STERILE IRR 1000ML POUR (IV SOLUTION) ×3 IMPLANT
WATER STERILE IRR 3000ML UROMA (IV SOLUTION) ×3 IMPLANT
WIRE SENSOR 0.038 NOT ANGLED (WIRE) ×2 IMPLANT

## 2017-06-27 NOTE — Anesthesia Procedure Notes (Signed)
Procedure Name: LMA Insertion Date/Time: 06/27/2017 1:54 PM Performed by: Eben Burow, CRNA Pre-anesthesia Checklist: Patient identified, Emergency Drugs available, Suction available, Patient being monitored and Timeout performed Patient Re-evaluated:Patient Re-evaluated prior to induction Oxygen Delivery Method: Circle system utilized Preoxygenation: Pre-oxygenation with 100% oxygen Induction Type: IV induction LMA: LMA inserted LMA Size: 5.0 Number of attempts: 1 Placement Confirmation: positive ETCO2 and breath sounds checked- equal and bilateral Tube secured with: Tape Dental Injury: Teeth and Oropharynx as per pre-operative assessment

## 2017-06-27 NOTE — Op Note (Signed)
Date of procedure: 06/27/17  Preoperative diagnosis:  1. History of bladder cancer 2. Bladder erythema 3. Left bladder neck tumor  Postoperative diagnosis:  1. same   Procedure: 1. Cystoscopy 2. Bladder biopsy 3. Bilateral retrograde pyelogram 4. TURBT, small  Surgeon: Hollice Espy, MD  Anesthesia: General  Complications: None  Intraoperative findings: Normal bilateral retrograde pyelogram.  Suspicious areas, less than 0.5 cm on the dome consistent with erythematous patch.  Patchy erythema overlying previous resection on the left lateral wall of the bladder adjacent to resection scar.  Slightly raised, very fine papillary velvety tumor on left bladder neck approximately 1.5 cm x 1.5 cm  EBL: Minimal  Specimens: Bladder dome biopsy, left lateral bladder wall biopsy, left bladder neck tumor  Drains: None  Indication: Eluzer Howdeshell is a 62 y.o. patient with history of bladder cancer including high-grade T1 with TCC as well as TA within a diverticulum who returns to the OR following induction BCG with concerns for persistent versus recurrent tumor.  After reviewing the management options for treatment, he elected to proceed with the above surgical procedure(s). We have discussed the potential benefits and risks of the procedure, side effects of the proposed treatment, the likelihood of the patient achieving the goals of the procedure, and any potential problems that might occur during the procedure or recuperation. Informed consent has been obtained.  Description of procedure:  The patient was taken to the operating room and general anesthesia was induced.  The patient was placed in the dorsal lithotomy position, prepped and draped in the usual sterile fashion, and preoperative antibiotics were administered. A preoperative time-out was performed.   A 21 French cystoscope was advanced per urethra into the bladder.  First, bilateral retrograde pyelograms were performed by  introducing a 5 [place] open-ended ureteral catheter just within the ureteral orifice.  Gentle retrograde pyelograms revealed decompressed ureters without hydronephrosis bilaterally.  There is no filling defects appreciated.  The trigone and ureteral orifices themselves were normal.  The bladder was carefully inspected and noted to have a small 0.5 cm area of patchy erythema on the midline dome of the bladder.  Additionally, adjacent to a large stellate scar on the left lateral wall, there is an area of erythema measuring approximately 1 cm x 1 cm.  The adjacent bladder diverticulum was also seen but unable to advance the scope into the diverticulum.  In addition to this, there was a approximately 1.5 x 1.5 cm area of slightly raised very fine papillary velvety tumor on the left bladder neck.  The UO was relatively spared as well as the trigone.  Cold cup biopsy forceps with the first used to biopsy the lesion on the dome followed by several biopsies of the left lateral bladder wall.  These areas were carefully fulgurated and all suspicious mucosa was completely fulgurated.  Next, a 4.5 semirigid ureteroscope was advanced into the left lateral bladder wall diverticulum where no tumor was identified within the tic.  Finally, using bipolar and saline as a medium, the left lateral bladder wall tumor was taken down to the muscularis layer using a bipolar loop.  This measured approximately 1.5 cm x 1.5 cm.  This is a good distance away from the ureteral orifice and the remainder of the trigone.  Once all of the suspicious tumor was resected, Bugbee electrocautery was used to fulgurate all additional suspicious areas.  Hemostasis was deemed excellent at this point time.  The bladder was then drained and scope was removed.  The  patient was then clean and dry, repositioned supine position, reversed from anesthesia, and taken to the PACU in stable condition.  Plan: I will have him return next week to discuss pathology  results.  Hollice Espy, M.D.

## 2017-06-27 NOTE — Interval H&P Note (Signed)
History and Physical Interval Note:  06/27/2017 1:04 PM  [name]  has presented today for surgery, with the diagnosis of malignant neoplasm of urinary bladder  The various methods of treatment have been discussed with the patient and family. After consideration of risks, benefits and other options for treatment, the patient has consented to  Procedure(s): CYSTOSCOPY WITH Bladder BIOPSY (N/A) CYSTOSCOPY WITH RETROGRADE PYELOGRAM (Bilateral) as a surgical intervention .  The patient's history has been reviewed, patient examined, no change in status, stable for surgery.  I have reviewed the patient's chart and labs.  Questions were answered to the patient's satisfaction.    RRR CTAB  Richard Oconnell

## 2017-06-27 NOTE — Transfer of Care (Signed)
Immediate Anesthesia Transfer of Care Note  [name]  Procedure(s) Performed: CYSTOSCOPY WITH Bladder BIOPSY (N/A Bladder) CYSTOSCOPY WITH RETROGRADE PYELOGRAM (Bilateral Ureter) TRANSURETHRAL RESECTION OF BLADDER TUMOR (TURBT) (N/A Bladder)  Patient Location: PACU  Anesthesia Type:General  Level of Consciousness: awake, alert , oriented and patient cooperative  Airway & Oxygen Therapy: Patient Spontanous Breathing and Patient connected to nasal cannula oxygen  Post-op Assessment: Report given to RN and Post -op Vital signs reviewed and stable  Post vital signs: Reviewed and stable  Last Vitals:  Vitals:   06/27/17 1222 06/27/17 1451  BP: 139/87 126/90  Pulse: 79 92  Resp: 18 16  Temp: 36.4 C 36.6 C  SpO2: 96% 93%    Last Pain:  Vitals:   06/27/17 1451  TempSrc:   PainSc: 0-No pain         Complications: No apparent anesthesia complications

## 2017-06-27 NOTE — Anesthesia Post-op Follow-up Note (Signed)
Anesthesia QCDR form completed.        

## 2017-06-27 NOTE — Discharge Instructions (Addendum)
Transurethral Resection of Bladder Tumor (TURBT) or Bladder Biopsy ° ° °Definition: ° Transurethral Resection of the Bladder Tumor is a surgical procedure used to diagnose and remove tumors within the bladder. TURBT is the most common treatment for early stage bladder cancer. ° °General instructions: °   ° Your recent bladder surgery requires very little post hospital care but some definite precautions. ° °Despite the fact that no skin incisions were used, the area around the bladder incisions are raw and covered with scabs to promote healing and prevent bleeding. Certain precautions are needed to insure that the scabs are not disturbed over the next 2-4 weeks while the healing proceeds. ° °Because the raw surface inside your bladder and the irritating effects of urine you may expect frequency of urination and/or urgency (a stronger desire to urinate) and perhaps even getting up at night more often. This will usually resolve or improve slowly over the healing period. You may see some blood in your urine over the first 6 weeks. Do not be alarmed, even if the urine was clear for a while. Get off your feet and drink lots of fluids until clearing occurs. If you start to pass clots or don't improve call us. ° °Diet: ° °You may return to your normal diet immediately. Because of the raw surface of your bladder, alcohol, spicy foods, foods high in acid and drinks with caffeine may cause irritation or frequency and should be used in moderation. To keep your urine flowing freely and avoid constipation, drink plenty of fluids during the day (8-10 glasses). Tip: Avoid cranberry juice because it is very acidic. ° °Activity: ° °Your physical activity doesn't need to be restricted. However, if you are very active, you may see some blood in the urine. We suggest that you reduce your activity under the circumstances until the bleeding has stopped. ° °Bowels: ° °It is important to keep your bowels regular during the postoperative  period. Straining with bowel movements can cause bleeding. A bowel movement every other day is reasonable. Use a mild laxative if needed, such as milk of magnesia 2-3 tablespoons, or 2 Dulcolax tablets. Call if you continue to have problems. If you had been taking narcotics for pain, before, during or after your surgery, you may be constipated. Take a laxative if necessary. ° ° ° °Medication: ° °You should resume your pre-surgery medications unless told not to. In addition you may be given an antibiotic to prevent or treat infection. Antibiotics are not always necessary. All medication should be taken as prescribed until the bottles are finished unless you are having an unusual reaction to one of the drugs. ° ° °[facility] °[place], [place] [address] °[phone] ° ° ° °AMBULATORY SURGERY  °DISCHARGE INSTRUCTIONS ° ° °1) The drugs that you were given will stay in your system until tomorrow so for the next 24 hours you should not: ° °A) Drive an automobile °B) Make any legal decisions °C) Drink any alcoholic beverage ° ° °2) You may resume regular meals tomorrow.  Today it is better to start with liquids and gradually work up to solid foods. ° °You may eat anything you prefer, but it is better to start with liquids, then soup and crackers, and gradually work up to solid foods. ° ° °3) Please notify your doctor immediately if you have any unusual bleeding, trouble breathing, redness and pain at the surgery site, drainage, fever, or pain not relieved by medication. ° ° ° °4) Additional Instructions: ° ° ° ° ° ° ° °  Please contact your physician with any problems or Same Day Surgery at [phone], Monday through Friday 6 am to 4 pm, or [facility] [place] Main number at [phone]. ° °

## 2017-06-27 NOTE — Anesthesia Preprocedure Evaluation (Signed)
Anesthesia Evaluation  Patient identified by MRN, date of birth, ID band Patient awake    Reviewed: Allergy & Precautions, NPO status , Patient's Chart, lab work & pertinent test results  Airway Mallampati: II  TM Distance: >3 FB     Dental  (+) Chipped, Caps   Pulmonary neg pulmonary ROS,    Pulmonary exam normal        Cardiovascular hypertension, Pt. on medications Normal cardiovascular exam     Neuro/Psych  Neuromuscular disease negative psych ROS   GI/Hepatic negative GI ROS, Neg liver ROS,   Endo/Other  diabetes  Renal/GU      Musculoskeletal   Abdominal Normal abdominal exam  (+)   Peds negative pediatric ROS (+)  Hematology   Anesthesia Other Findings Past Medical History: No date: Diabetes mellitus without complication ([place]) 02/03/1659: Erectile dysfunction associated with type 2 diabetes  mellitus ([place]) 11/16/2016: Gross hematuria 06/11/2015: Hypertension 06/11/2015: Type 2 diabetes mellitus with diabetic polyneuropathy,  without long-term current use of insulin (HCC)  Reproductive/Obstetrics                             Anesthesia Physical  Anesthesia Plan  ASA: II  Anesthesia Plan: General   Post-op Pain Management:    Induction: Intravenous  PONV Risk Score and Plan: 3 and Ondansetron, Dexamethasone, Propofol and Midazolam  Airway Management Planned: LMA  Additional Equipment:   Intra-op Plan:   Post-operative Plan: Extubation in OR  Informed Consent: I have reviewed the patients History and Physical, chart, labs and discussed the procedure including the risks, benefits and alternatives for the proposed anesthesia with the patient or authorized representative who has indicated his/her understanding and acceptance.   Dental advisory given  Plan Discussed with: CRNA and Surgeon  Anesthesia Plan Comments:         Anesthesia Quick Evaluation

## 2017-06-28 NOTE — Anesthesia Postprocedure Evaluation (Signed)
Anesthesia Post Note  [name]  Procedure(s) Performed: CYSTOSCOPY WITH Bladder BIOPSY (N/A Bladder) CYSTOSCOPY WITH RETROGRADE PYELOGRAM (Bilateral Ureter) TRANSURETHRAL RESECTION OF BLADDER TUMOR (TURBT) (N/A Bladder)  Patient location during evaluation: PACU Anesthesia Type: General Level of consciousness: awake and alert and oriented Pain management: pain level controlled Vital Signs Assessment: post-procedure vital signs reviewed and stable Respiratory status: spontaneous breathing Cardiovascular status: blood pressure returned to baseline Anesthetic complications: no     Last Vitals:  Vitals:   06/27/17 1532 06/27/17 1557  BP: 131/85 131/86  Pulse: 63 69  Resp: 16   Temp: (!) 36.2 C   SpO2: 100% 100%    Last Pain:  Vitals:   06/28/17 0833  TempSrc:   PainSc: 0-No pain                 Catheryn Slifer

## 2017-06-29 ENCOUNTER — Other Ambulatory Visit: Payer: Self-pay | Admitting: Urology

## 2017-06-29 LAB — CULTURE, URINE COMPREHENSIVE

## 2017-07-01 LAB — SURGICAL PATHOLOGY

## 2017-07-07 ENCOUNTER — Ambulatory Visit (INDEPENDENT_AMBULATORY_CARE_PROVIDER_SITE_OTHER): Payer: BLUE CROSS/BLUE SHIELD | Admitting: Urology

## 2017-07-07 ENCOUNTER — Encounter: Payer: Self-pay | Admitting: Urology

## 2017-07-07 VITALS — BP 166/84 | HR 94 | Ht 75.0 in | Wt 260.0 lb

## 2017-07-07 DIAGNOSIS — C672 Malignant neoplasm of lateral wall of bladder: Secondary | ICD-10-CM

## 2017-07-07 NOTE — Progress Notes (Signed)
07/07/2017 11:56 AM   [name] 03-Sep-1955 [id number]  Referring provider: [name], DO [address] [place], [place] [address]  No chief complaint on file.   HPI:  62 year old male with recurrent bladder cancer who presents today to discuss his most recent pathology from bladder biopsy on 06/27/2017.  He initially presented with gross hematuria and was taken to the operating room for TURBT x2 on 11/2016 and 01/2017.  Intraoperatively, clot was removed from the bladder. Upon inspection of his bladder, there are 2 discrete tumors in the bladder, one measuring 2.5 cm in the left lateral wall just superior to the bladder diverticulum highly suspicious for an invasive tumor. In addition to this, there was a velvety patch just below the diverticulum as well as a papillary lesion within the diverticulum itself.    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  Re-TUR BT showed persistent high-grade without upstaging.  He underwent induction BCG which was well-tolerated.  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  Most recently, he returned at which time multiple areas of bladder biopsy.  Showed evidence of persistent CIS.  Postoperatively, he had no issues.  He returns to the office today accompanied by his sister to discuss options   PMH: Past Medical History:  Diagnosis Date  . Cancer [facility])    Bladder Cancer  . Erectile dysfunction associated with type 2 diabetes mellitus ([place]) 05/26/2016  . Gross hematuria 11/16/2016  . Hypertension 06/11/2015  . Type 2 diabetes mellitus with diabetic polyneuropathy, without long-term current use of insulin ([place]) 06/11/2015    Surgical History: Past Surgical History:  Procedure Laterality Date  . boil lanceted     hand  . CYSTOSCOPY W/ RETROGRADES Bilateral 12/06/2016   Procedure: CYSTOSCOPY WITH RETROGRADE PYELOGRAM;  Surgeon: Hollice Espy, MD;  Location: [facility] ORS;  Service: Urology;  Laterality: Bilateral;  . CYSTOSCOPY W/ RETROGRADES Bilateral 06/27/2017   Procedure: CYSTOSCOPY WITH RETROGRADE PYELOGRAM;  Surgeon: Hollice Espy, MD;  Location: [facility] ORS;  Service: Urology;  Laterality: Bilateral;  . CYSTOSCOPY WITH BIOPSY N/A 06/27/2017   Procedure: CYSTOSCOPY WITH Bladder BIOPSY;  Surgeon: Hollice Espy, MD;  Location: [facility] ORS;  Service: Urology;  Laterality: N/A;  . ingrown  Bilateral    ingrown toenail  . TRANSURETHRAL RESECTION OF BLADDER TUMOR N/A 12/06/2016   Procedure: TRANSURETHRAL RESECTION OF BLADDER TUMOR (TURBT) (2-5cm) CLOT EVACUATION;  Surgeon: Hollice Espy, MD;  Location: [facility] ORS;  Service: Urology;  Laterality: N/A;  . TRANSURETHRAL RESECTION OF BLADDER TUMOR N/A 06/27/2017   Procedure: TRANSURETHRAL RESECTION OF BLADDER TUMOR (TURBT);  Surgeon: Hollice Espy, MD;  Location: [facility] ORS;  Service: Urology;  Laterality: N/A;  . TRANSURETHRAL RESECTION OF BLADDER TUMOR WITH MITOMYCIN-C N/A 01/18/2017   Procedure: TRANSURETHRAL RESECTION OF BLADDER TUMOR WITH MITOMYCIN-C-(SMALL);  Surgeon: Hollice Espy, MD;  Location: [facility] ORS;  Service: Urology;  Laterality: N/A;    Home Medications:  Allergies as of 07/07/2017   No Known Allergies     Medication List        Accurate as of 07/07/17 11:56 AM. Always use your most recent med list.          acetaminophen 500 MG tablet Commonly known as:  TYLENOL Take 1,000 mg by mouth every 8 (eight) hours as needed for mild pain.   calcium carbonate 500 MG chewable tablet Commonly known as:  TUMS - dosed  in mg elemental calcium Chew 2 tablets by mouth daily as needed for indigestion or heartburn.   diphenhydramine-acetaminophen 25-500 MG Tabs tablet Commonly known as:  TYLENOL PM Take 2 tablets by mouth at bedtime as needed (sleep).   metFORMIN 1000 MG tablet Commonly known as:   GLUCOPHAGE Take 1 tablet (1,000 mg total) by mouth 2 (two) times daily with a meal.   oxybutynin 5 MG tablet Commonly known as:  DITROPAN Take 1 tablet (5 mg total) by mouth every 8 (eight) hours as needed for bladder spasms.       Allergies: No Known Allergies  Family History: Family History  Problem Relation Age of Onset  . Heart attack Mother   . Heart disease Mother   . Parkinson's disease Father   . Heart attack Maternal Grandfather   . Heart disease Maternal Grandfather   . Heart attack Maternal Aunt   . Heart attack Maternal Grandmother   . Prostate cancer Neg Hx   . Bladder Cancer Neg Hx   . Kidney cancer Neg Hx     Social History:  reports that  has never smoked. he has never used smokeless tobacco. He reports that he does not drink alcohol or use drugs.  ROS: UROLOGY Frequent Urination?: No Hard to postpone urination?: No Burning/pain with urination?: No Get up at night to urinate?: No Leakage of urine?: No Urine stream starts and stops?: No Trouble starting stream?: No Do you have to strain to urinate?: No Blood in urine?: No Urinary tract infection?: No Sexually transmitted disease?: No Injury to kidneys or bladder?: No Painful intercourse?: No Weak stream?: No Erection problems?: No Penile pain?: No  Gastrointestinal Nausea?: No Vomiting?: No Indigestion/heartburn?: No Diarrhea?: No Constipation?: No  Constitutional Fever: No Night sweats?: No Weight loss?: No Fatigue?: No  Skin Skin rash/lesions?: No Itching?: No  Eyes Blurred vision?: No Double vision?: No  Ears/Nose/Throat Sore throat?: No Sinus problems?: No  Hematologic/Lymphatic Swollen glands?: No Easy bruising?: No  Cardiovascular Leg swelling?: No Chest pain?: No  Respiratory Cough?: No Shortness of breath?: No  Endocrine Excessive thirst?: No  Musculoskeletal Back pain?: No Joint pain?: No  Neurological Headaches?: No Dizziness?:  No  Psychologic Depression?: No Anxiety?: No  Physical Exam: BP (!) 166/84   Pulse 94   Ht 6\' 3"  (1.905 m)   Wt 260 lb (117.9 kg)   BMI 32.50 kg/m   Constitutional:  Alert and oriented, No acute distress.  Presents today accompanied by sister. HEENT: [place] AT, moist mucus membranes.  Trachea midline, no masses. Cardiovascular: No clubbing, cyanosis, or edema. Respiratory: Normal respiratory effort, no increased work of breathing. Skin: No rashes, bruises or suspicious lesions. Neurologic: Grossly intact, no focal deficits, moving all 4 extremities. Psychiatric: Normal mood and affect.  Laboratory Data: Lab Results  Component Value Date   WBC 8.9 11/23/2016   HGB 14.3 11/23/2016   HCT 42.4 11/23/2016   MCV 87.8 11/23/2016   PLT 300 11/23/2016    Lab Results  Component Value Date   CREATININE 1.03 11/24/2016    Lab Results  Component Value Date   HGBA1C 6.4 (H) 11/24/2016    Urinalysis N/a  Pertinent Imaging: CT renal stone 11/1016  Assessment & Plan:    1. Malignant neoplasm of lateral wall of urinary bladder (HCC) Recurrent vs. More likely pertinent CIS following induction BCG with history of HgT1 disease Options including referral for cystectomy  Vs. Repeat induction BCG x 6 vs. 2nd line intravesical therapy Risk and  benefits of each were discussed as well as efficacy and response rates He would like to try BCG again and plan for repeat biopsy in ~ 2 month after completion- if fails, consider referral for clinical trial vs. Cystectomy All questions answered   Hollice Espy, MD  [facility] [address] [place], [place] [address] [phone]

## 2017-07-19 ENCOUNTER — Telehealth: Payer: Self-pay | Admitting: Urology

## 2017-07-19 NOTE — Telephone Encounter (Signed)
I do not believe this will be an issue.  Hollice Espy, MD

## 2017-07-19 NOTE — Telephone Encounter (Signed)
Patient is starting a round of BCG and possibly has Shingles, he is starting on Valtrex today until the Dr can gets the biopsy results back to confirm that he does have it. He wants to know if this will interfere with his treatments? Does he need to have them right now? Please advise [phone]  Sharyn Lull

## 2017-07-25 NOTE — Telephone Encounter (Signed)
Patient notified/SW 

## 2017-07-26 ENCOUNTER — Other Ambulatory Visit: Payer: Self-pay | Admitting: Radiology

## 2017-07-26 DIAGNOSIS — C679 Malignant neoplasm of bladder, unspecified: Secondary | ICD-10-CM

## 2017-07-28 ENCOUNTER — Other Ambulatory Visit: Payer: Self-pay

## 2017-07-28 ENCOUNTER — Ambulatory Visit: Payer: BLUE CROSS/BLUE SHIELD

## 2017-07-28 DIAGNOSIS — C679 Malignant neoplasm of bladder, unspecified: Secondary | ICD-10-CM

## 2017-07-28 LAB — URINALYSIS, COMPLETE
Bilirubin, UA: NEGATIVE
Leukocytes, UA: NEGATIVE
Nitrite, UA: NEGATIVE
Specific Gravity, UA: >1.03 — ABNORMAL HIGH (ref 1.005–1.030)
Urobilinogen, Ur: 0.2 mg/dL (ref 0.2–1.0)
pH, UA: 5 (ref 5.0–7.5)

## 2017-07-28 LAB — MICROSCOPIC EXAMINATION
Bacteria, UA: NONE SEEN
Epithelial Cells (non renal): NONE SEEN /hpf (ref 0–10)
RBC, UA: NONE SEEN /hpf (ref 0–?)

## 2017-07-28 MED ORDER — BCG LIVE 50 MG IS SUSR
3.2400 mL | Freq: Once | INTRAVESICAL | Status: AC
  Administered 2017-07-28: 81 mg via INTRAVESICAL

## 2017-07-28 NOTE — Progress Notes (Signed)
BCG Bladder Instillation  BCG # 1  Due to Bladder Cancer patient is present today for a BCG treatment. Patient was cleaned and prepped in a sterile fashion with betadine and lidocaine 2% jelly was instilled into the urethra.  A 14FR catheter was inserted, urine return was noted 44ml, urine was yellow in color.  71ml of reconstituted BCG was instilled into the bladder. The catheter was then removed. Patient tolerated well, no complications were noted  Preformed by: Toniann Fail, LPN, Elberta Leatherwood, CMA  Follow up/ Additional notes: 1 week

## 2017-08-04 ENCOUNTER — Ambulatory Visit (INDEPENDENT_AMBULATORY_CARE_PROVIDER_SITE_OTHER): Payer: BLUE CROSS/BLUE SHIELD

## 2017-08-04 DIAGNOSIS — C679 Malignant neoplasm of bladder, unspecified: Secondary | ICD-10-CM

## 2017-08-04 LAB — URINALYSIS, COMPLETE
Bilirubin, UA: NEGATIVE
Ketones, UA: NEGATIVE
Nitrite, UA: NEGATIVE
Specific Gravity, UA: 1.025 (ref 1.005–1.030)
Urobilinogen, Ur: 0.2 mg/dL (ref 0.2–1.0)
pH, UA: 5 (ref 5.0–7.5)

## 2017-08-04 LAB — MICROSCOPIC EXAMINATION
Bacteria, UA: NONE SEEN
Epithelial Cells (non renal): NONE SEEN /hpf (ref 0–10)

## 2017-08-04 MED ORDER — BCG LIVE 50 MG IS SUSR
3.2400 mL | Freq: Once | INTRAVESICAL | Status: AC
  Administered 2017-08-04: 81 mg via INTRAVESICAL

## 2017-08-04 NOTE — Progress Notes (Signed)
BCG Bladder Instillation  BCG # 2  Due to Bladder Cancer patient is present today for a BCG treatment. Patient was cleaned and prepped in a sterile fashion with betadine and lidocaine 2% jelly was instilled into the urethra.  A 14FR catheter was inserted, urine return was noted 78ml, urine was yellow in color.  68ml of reconstituted BCG was instilled into the bladder. The catheter was then removed. Patient tolerated well, no complications were noted  Preformed by: Toniann Fail, LPN, Fonnie Jarvis, CMA  Follow up/ Additional notes: next week

## 2017-08-10 NOTE — Progress Notes (Signed)
    08/11/2017 12:37 PM   Richard Oconnell 06-Jul-1955 [id number]  Referring provider: [name], DO [address] [name], [place] [address]  No chief complaint on file.   HPI: Patient was a 62 year old Caucasian male with bladder cancer who presents today for a repeat induction of BCG.  #1/6.   Urinalysis > 30 WBC's.  See Epic.    I have reviewed the labs.  Procedure BCG Bladder Instillation  BCG not completed today.  Urine sent for culture.    Assessment & Plan:    1. Malignant neoplasm of lateral wall of urinary bladder (HCC) Recurrent vs. More likely pertinent CIS following induction BCG with history of HgT1 disease Options including referral for cystectomy  Vs. Repeat induction BCG x 6 vs. 2nd line intravesical therapy Risk and benefits of each were discussed as well as efficacy and response rates He would like to try BCG again and plan for repeat biopsy in ~ 2 month after completion- if fails, consider referral for clinical trial vs. Cystectomy  Return for pending urine culture resuts.  These notes generated with voice recognition software. I apologize for typographical errors.  Zara Council, [facility] [address] [place], [place] [address] [phone]

## 2017-08-11 ENCOUNTER — Ambulatory Visit: Payer: BLUE CROSS/BLUE SHIELD | Admitting: Urology

## 2017-08-11 DIAGNOSIS — C679 Malignant neoplasm of bladder, unspecified: Secondary | ICD-10-CM

## 2017-08-11 LAB — MICROSCOPIC EXAMINATION: WBC, UA: >30 /hpf — AB (ref 0–5)

## 2017-08-11 LAB — URINALYSIS, COMPLETE
Bilirubin, UA: NEGATIVE
Nitrite, UA: NEGATIVE
Specific Gravity, UA: 1.025 (ref 1.005–1.030)
Urobilinogen, Ur: 0.2 mg/dL (ref 0.2–1.0)
pH, UA: 6.5 (ref 5.0–7.5)

## 2017-08-14 LAB — CULTURE, URINE COMPREHENSIVE

## 2017-08-15 ENCOUNTER — Telehealth: Payer: Self-pay

## 2017-08-15 NOTE — Telephone Encounter (Signed)
-----   Message from Garnette Gunner, [place] sent at 08/15/2017  9:54 AM EDT -----   ----- Message ----- From: Nori Riis, PA-C Sent: 08/14/2017   8:22 PM To: [facility]  Please let Mr. Bok know that his urine culture was negative and we can proceed with his BCG treatment.

## 2017-08-15 NOTE — Telephone Encounter (Signed)
Patient notified

## 2017-08-18 ENCOUNTER — Other Ambulatory Visit: Payer: Self-pay

## 2017-08-18 ENCOUNTER — Telehealth: Payer: Self-pay | Admitting: Family Medicine

## 2017-08-18 ENCOUNTER — Ambulatory Visit (INDEPENDENT_AMBULATORY_CARE_PROVIDER_SITE_OTHER): Payer: BLUE CROSS/BLUE SHIELD | Admitting: Urology

## 2017-08-18 DIAGNOSIS — C679 Malignant neoplasm of bladder, unspecified: Secondary | ICD-10-CM

## 2017-08-18 DIAGNOSIS — E1142 Type 2 diabetes mellitus with diabetic polyneuropathy: Secondary | ICD-10-CM

## 2017-08-18 LAB — URINALYSIS, COMPLETE
Bilirubin, UA: NEGATIVE
Leukocytes, UA: NEGATIVE
Nitrite, UA: NEGATIVE
Protein, UA: NEGATIVE
Specific Gravity, UA: >1.03 — ABNORMAL HIGH (ref 1.005–1.030)
Urobilinogen, Ur: 0.2 mg/dL (ref 0.2–1.0)
pH, UA: 5 (ref 5.0–7.5)

## 2017-08-18 LAB — MICROSCOPIC EXAMINATION
Bacteria, UA: NONE SEEN
Epithelial Cells (non renal): NONE SEEN /hpf (ref 0–10)

## 2017-08-18 MED ORDER — METFORMIN HCL 1000 MG PO TABS: 1000.0000 mg | ORAL_TABLET | Freq: Two times a day (BID) | ORAL | 0 refills | Status: DC

## 2017-08-18 MED ORDER — BCG LIVE 50 MG IS SUSR
3.2400 mL | Freq: Once | INTRAVESICAL | Status: AC
  Administered 2017-08-18: 81 mg via INTRAVESICAL

## 2017-08-18 NOTE — Telephone Encounter (Signed)
Pt needs refill on metformin sent to [name].  His call back [phone]

## 2017-08-18 NOTE — Telephone Encounter (Signed)
Refill send for 30 days needs appointment for further refill.

## 2017-08-18 NOTE — Progress Notes (Signed)
BCG Bladder Instillation  BCG # 3  Due to Bladder Cancer patient is present today for a BCG treatment. Patient was cleaned and prepped in a sterile fashion with betadine and lidocaine 2% jelly was instilled into the urethra.  A 14FR catheter was inserted, urine return was noted 84ml, urine was yellow in color.  34ml of reconstituted BCG was instilled into the bladder. The catheter was then removed. Patient tolerated well, no complications were noted  Preformed by: Fonnie Jarvis, CMA and Cristie Hem, CMA  Follow up/ Additional notes: 1 week

## 2017-08-25 ENCOUNTER — Ambulatory Visit (INDEPENDENT_AMBULATORY_CARE_PROVIDER_SITE_OTHER): Payer: BLUE CROSS/BLUE SHIELD | Admitting: Urology

## 2017-08-25 DIAGNOSIS — C679 Malignant neoplasm of bladder, unspecified: Secondary | ICD-10-CM | POA: Diagnosis not present

## 2017-08-25 LAB — URINALYSIS, COMPLETE
Bilirubin, UA: NEGATIVE
Ketones, UA: NEGATIVE
Leukocytes, UA: NEGATIVE
Nitrite, UA: NEGATIVE
Protein, UA: NEGATIVE
RBC, UA: NEGATIVE
Specific Gravity, UA: 1.015 (ref 1.005–1.030)
Urobilinogen, Ur: 0.2 mg/dL (ref 0.2–1.0)
pH, UA: 5 (ref 5.0–7.5)

## 2017-08-25 LAB — MICROSCOPIC EXAMINATION
Bacteria, UA: NONE SEEN
Epithelial Cells (non renal): NONE SEEN /hpf (ref 0–10)
RBC, UA: NONE SEEN /hpf (ref 0–2)

## 2017-08-25 MED ORDER — BCG LIVE 50 MG IS SUSR
3.2400 mL | Freq: Once | INTRAVESICAL | Status: AC
  Administered 2017-08-25: 81 mg via INTRAVESICAL

## 2017-08-25 NOTE — Progress Notes (Signed)
BCG Bladder Instillation  BCG # 4  Due to Bladder Cancer patient is present today for a BCG treatment. Patient was cleaned and prepped in a sterile fashion with betadine and lidocaine 2% jelly was instilled into the urethra.  A 14FR catheter was inserted, urine return was noted 14ml, urine was yellow in color.  38ml of reconstituted BCG was instilled into the bladder. The catheter was then removed. Patient tolerated well, no complications were noted  Preformed by: Fonnie Jarvis, CMA  Follow up/ Additional notes: 1 week

## 2017-09-01 ENCOUNTER — Ambulatory Visit: Payer: BLUE CROSS/BLUE SHIELD

## 2017-09-01 DIAGNOSIS — C679 Malignant neoplasm of bladder, unspecified: Secondary | ICD-10-CM

## 2017-09-01 LAB — MICROSCOPIC EXAMINATION: Epithelial Cells (non renal): NONE SEEN /hpf (ref 0–10)

## 2017-09-01 LAB — URINALYSIS, COMPLETE
Bilirubin, UA: NEGATIVE
Ketones, UA: NEGATIVE
Leukocytes, UA: NEGATIVE
Nitrite, UA: NEGATIVE
Specific Gravity, UA: >1.03 — ABNORMAL HIGH (ref 1.005–1.030)
Urobilinogen, Ur: 0.2 mg/dL (ref 0.2–1.0)
pH, UA: 5 (ref 5.0–7.5)

## 2017-09-01 NOTE — Progress Notes (Signed)
Per Dr. Erlene Quan BCG was held today due to possible infection. Pt was very upset but voiced understanding. Urine was sent for cx. Will call pt with results on Monday.

## 2017-09-05 ENCOUNTER — Telehealth: Payer: Self-pay | Admitting: Urology

## 2017-09-05 ENCOUNTER — Ambulatory Visit (INDEPENDENT_AMBULATORY_CARE_PROVIDER_SITE_OTHER): Payer: BLUE CROSS/BLUE SHIELD | Admitting: Family Medicine

## 2017-09-05 DIAGNOSIS — C679 Malignant neoplasm of bladder, unspecified: Secondary | ICD-10-CM

## 2017-09-05 LAB — URINALYSIS, COMPLETE
Bilirubin, UA: NEGATIVE
Ketones, UA: NEGATIVE
Leukocytes, UA: NEGATIVE
Nitrite, UA: NEGATIVE
Protein, UA: NEGATIVE
Specific Gravity, UA: >1.03 — ABNORMAL HIGH (ref 1.005–1.030)
Urobilinogen, Ur: 0.2 mg/dL (ref 0.2–1.0)
pH, UA: 6 (ref 5.0–7.5)

## 2017-09-05 LAB — MICROSCOPIC EXAMINATION

## 2017-09-05 MED ORDER — BCG LIVE 50 MG IS SUSR
3.2400 mL | Freq: Once | INTRAVESICAL | Status: AC
  Administered 2017-09-05: 81 mg via INTRAVESICAL

## 2017-09-05 NOTE — Progress Notes (Signed)
BCG Bladder Instillation  BCG # 5  Due to Bladder Cancer patient is present today for a BCG treatment. Patient was cleaned and prepped in a sterile fashion with betadine and lidocaine 2% jelly was instilled into the urethra.  A 14FR catheter was inserted, urine return was noted 278ml, urine was dark yellow in color.  35ml of reconstituted BCG was instilled into the bladder. The catheter was then removed. Patient tolerated well, no complications were noted  Preformed by: Elberta Leatherwood, CMA, Fonnie Jarvis, CMA

## 2017-09-05 NOTE — Telephone Encounter (Signed)
Pt called clinic asking for results of his Culture that was sent off on Thurs. Pt was unable to get his BCG and was told someone would call him with results today, and would get his BCG today, pt is following up.  Please advise.  Thanks.

## 2017-09-05 NOTE — Telephone Encounter (Signed)
Urine culture was not sent , patient was told to come into the office today to recheck urine. Urine today was clear BCG was done

## 2017-09-06 ENCOUNTER — Telehealth: Payer: Self-pay | Admitting: Urology

## 2017-09-06 NOTE — Telephone Encounter (Signed)
Spoke to patient and appointment has been moved. His surgery will be moved also.

## 2017-09-06 NOTE — Telephone Encounter (Signed)
Pt called office LMOM asking to confirm his BCG appt on Thurs 4/25 @ 10:30.  Please advise. Thanks.

## 2017-09-07 ENCOUNTER — Telehealth: Payer: Self-pay | Admitting: Radiology

## 2017-09-07 NOTE — Telephone Encounter (Signed)
Unable to [facility] with pt d/t no vm set up. LMOM of sister, Luanna Salk, to return call regarding rescheduling bladder biopsy until 6 weeks after last BCG treatment.

## 2017-09-07 NOTE — Telephone Encounter (Signed)
Discussed rescheduling bladder biopsy to 10/24/2017 (6 weeks after last BCG treatment). Sister states pt is in agreement with this date. Questions answered. Sister voices understanding.

## 2017-09-08 ENCOUNTER — Ambulatory Visit: Payer: BLUE CROSS/BLUE SHIELD

## 2017-09-12 ENCOUNTER — Ambulatory Visit (INDEPENDENT_AMBULATORY_CARE_PROVIDER_SITE_OTHER): Payer: BLUE CROSS/BLUE SHIELD

## 2017-09-12 ENCOUNTER — Other Ambulatory Visit: Payer: Self-pay

## 2017-09-12 ENCOUNTER — Telehealth: Payer: Self-pay | Admitting: Family Medicine

## 2017-09-12 DIAGNOSIS — C679 Malignant neoplasm of bladder, unspecified: Secondary | ICD-10-CM | POA: Diagnosis not present

## 2017-09-12 LAB — URINALYSIS, COMPLETE
Bilirubin, UA: NEGATIVE
Ketones, UA: NEGATIVE
Nitrite, UA: NEGATIVE
Protein, UA: NEGATIVE
RBC, UA: NEGATIVE
Specific Gravity, UA: 1.025 (ref 1.005–1.030)
Urobilinogen, Ur: 0.2 mg/dL (ref 0.2–1.0)
pH, UA: 5.5 (ref 5.0–7.5)

## 2017-09-12 LAB — MICROSCOPIC EXAMINATION

## 2017-09-12 MED ORDER — BCG LIVE 50 MG IS SUSR
3.2400 mL | Freq: Once | INTRAVESICAL | Status: AC
  Administered 2017-09-12: 81 mg via INTRAVESICAL

## 2017-09-12 NOTE — Telephone Encounter (Signed)
Pt needs a refill on metformin sent to [facility].  His call back number is [phone]

## 2017-09-12 NOTE — Telephone Encounter (Signed)
Patient advised.

## 2017-09-12 NOTE — Progress Notes (Signed)
BCG Bladder Instillation  BCG # 6  Due to Bladder Cancer patient is present today for a BCG treatment. Patient was cleaned and prepped in a sterile fashion with betadine and lidocaine 2% jelly was instilled into the urethra.  A 14FR catheter was inserted, urine return was noted 364ml, urine was orange in color.  33ml of reconstituted BCG was instilled into the bladder. The catheter was then removed. Patient tolerated well, no complications were noted  Preformed by: Fonnie Jarvis, CMA and Delane Ginger  Follow up/ Additional notes: 6wk bladder biopsy

## 2017-09-15 ENCOUNTER — Ambulatory Visit
Admission: RE | Admit: 2017-09-15 | Discharge: 2017-09-15 | Disposition: A | Discharge status: Home / Self Care | Payer: BLUE CROSS/BLUE SHIELD | Source: Ambulatory Visit | Attending: Family Medicine | Admitting: Family Medicine

## 2017-09-15 ENCOUNTER — Encounter: Payer: Self-pay | Admitting: Family Medicine

## 2017-09-15 ENCOUNTER — Ambulatory Visit (INDEPENDENT_AMBULATORY_CARE_PROVIDER_SITE_OTHER): Payer: BLUE CROSS/BLUE SHIELD | Admitting: Family Medicine

## 2017-09-15 VITALS — BP 148/72 | HR 107 | Temp 98.5°F | Resp 16 | Ht 75.0 in | Wt 252.0 lb

## 2017-09-15 DIAGNOSIS — M25552 Pain in left hip: Secondary | ICD-10-CM

## 2017-09-15 DIAGNOSIS — M5136 Other intervertebral disc degeneration, lumbar region: Secondary | ICD-10-CM | POA: Insufficient documentation

## 2017-09-15 DIAGNOSIS — W19XXXA Unspecified fall, initial encounter: Secondary | ICD-10-CM | POA: Diagnosis not present

## 2017-09-15 MED ORDER — CYCLOBENZAPRINE HCL 10 MG PO TABS: 10.0000 mg | ORAL_TABLET | Freq: Every evening | ORAL | 0 refills | Status: DC | PRN

## 2017-09-15 NOTE — Progress Notes (Signed)
Subjective:    [name], male    DOB: Jun 07, 1955, 62 y.o.   MRN: [id number]  Richard Oconnell is a 62 y.o. male presenting on 09/15/2017 for Fall (left side pain as per patient had fall on left side after BCG done on Monday 04/30 at urologist office didn't mention anyone but now pain is getting worst and can't move Left foot up.)  Patient presents for a same day appointment.  HPI   LEFT HIP PAIN / FALL Reports acute onset new fall injury occurred Monday about 3 days ago while in the Urology office, he was putting clothes back on after procedure (BCG) last treatment, he fell on Left side of body hit his left hip directly, he had some pain after, but he was able to ambulate and left their office without telling them at that time. He thought it would get better, but still had pain few days later, seemed to get worse, he wanted to check it out. - He is able to walk without problem, only admits pain with certain movements and difficulty raising leg to put shoe on - Not taking any medicine for it, has not tried muscle relaxant before but asking about this type of medicine - he tried to stretch it some for relief - Denies bruising of skin or redness, laceration, other joint injury, did not hit head when fell, dizziness lightheaded weakness, numbness tingling  Additional complaint - He has some anxiety about upcoming cystoscopy biopsy surgery 10/24/17 with Dr [facility], he is asking about medicine to help him rest and sleep and stay calm before this, he has not been on many of these medicines before  Depression screen [facility] 2/9 03/04/2017 06/11/2015  Decreased Interest 0 0  Down, Depressed, Hopeless 0 0  PHQ - 2 Score 0 0    Social History   Tobacco Use  . Smoking status: Never Smoker  . Smokeless tobacco: Never Used  . Tobacco comment: once a while had one cigar >10 years ago  Substance Use Topics  . Alcohol use: No    Alcohol/week: 0.0 oz  . Drug use: No      Review of Systems Per HPI unless specifically indicated above     Objective:    BP (!) 148/72   Pulse (!) 107   Temp 98.5 F (36.9 C) (Oral)   Resp 16   Ht 6\' 3"  (1.905 m)   Wt 252 lb (114.3 kg)   BMI 31.50 kg/m   Wt Readings from Last 3 Encounters:  09/15/17 252 lb (114.3 kg)  07/07/17 260 lb (117.9 kg)  06/27/17 260 lb (117.9 kg)    Physical Exam  Constitutional: He is oriented to person, place, and time. He appears well-developed and well-nourished. No distress.  Well-appearing, comfortable, cooperative  HENT:  Head: Normocephalic and atraumatic.  Mouth/Throat: Oropharynx is clear and moist.  Eyes: Conjunctivae are normal. Right eye exhibits no discharge. Left eye exhibits no discharge.  Cardiovascular: Normal rate.  Pulmonary/Chest: Effort normal.  Musculoskeletal: He exhibits no edema.  Low Back / L hip and Greater Trochanter Inspection: BACK - Normal appearance, no spinal deformity, symmetrical. HIP - Normal appearance, symmetrical, no obvious leg length or pelvis deformity. No ecchymosis over skin  Palpation: BACK - No tenderness over spinous processes. Bilateral lumbar paraspinal muscles non-tender and without hypertonicity/spasm. HIP - Mild tender to palpation deeper L greater trochanter region and more piriformis gluteal region. Lower extremity thigh calf soft non tender no  spasm.  ROM: BACK - Full active ROM forward flex / back extension, rotation L/R without discomfort HIP - Bilateral hip flex/ext supine normal, external rotation normal without problem or limitation. He has some significant stiffness with L internal rotation of hip supine. He has limitation in seated position cannot externally rotate hip to raise leg up.  Special Testing: BACK - Seated SLR negative for radicular pain bilaterally HIP - FABER normal and non tender no limited movement. Acetabular compression of hip L negative  Strength: Bilateral hip flex/ext 5/5, knee flex/ext 5/5, ankle  dorsiflex/plantarflex 5/5 Neurovascular: intact distal sensation to light touch  Neurological: He is alert and oriented to person, place, and time.  Skin: Skin is warm and dry. No rash noted. He is not diaphoretic. No erythema.  Psychiatric: He has a normal mood and affect. His behavior is normal.  Well groomed, good eye contact, normal speech and thoughts  Nursing note and vitals reviewed.    I have personally reviewed the radiology report from 09/15/17 Left Hip + Pelvis STAT X-ray.  CLINICAL DATA: 62 year old male with left hip pain after fall 3 days ago from standing.  EXAM: DG HIP (WITH OR WITHOUT PELVIS) 2-3V LEFT  COMPARISON: [facility] CT Abdomen and Pelvis 11/23/2016.  FINDINGS: Femoral heads remain normally located. The hip joint spaces appear stable, symmetric, and normal for age. The pelvis appears intact. Grossly intact proximal right femur. The proximal left femur appears intact. The sacral ala and SI joints appear stable and within normal limits. Advanced lumbar disc and endplate degeneration with multilevel vacuum disc partially redemonstrated.  IMPRESSION: 1. Negative for age radiographic appearance of the left hip and pelvis. If occult hip fracture is suspected or if the patient is unable to weightbear, MRI is the preferred modality for further evaluation. 2. Advanced lumbar spine degeneration.   Electronically Signed By: Genevie Ann M.D. On: 09/15/2017 11:52  Results for orders placed or performed in visit on 09/12/17  Microscopic Examination  Result Value Ref Range   WBC, UA 0-5 0 - 5 /hpf   RBC, UA 3-10 (A) 0 - 2 /hpf   Epithelial Cells (non renal) CANCELED    Mucus, UA Present (A) Not Estab.   Bacteria, UA Few (A) None seen/Few  Urinalysis, Complete  Result Value Ref Range   Specific Gravity, UA 1.025 1.005 - 1.030   pH, UA 5.5 5.0 - 7.5   Color, UA Yellow Yellow   Appearance Ur Clear Clear   Leukocytes, UA Trace (A)  Negative   Protein, UA Negative Negative/Trace   Glucose, UA 2+ (A) Negative   Ketones, UA Negative Negative   RBC, UA Negative Negative   Bilirubin, UA Negative Negative   Urobilinogen, Ur 0.2 0.2 - 1.0 mg/dL   Nitrite, UA Negative Negative   Microscopic Examination See below:       Assessment & Plan:   Problem List Items Addressed This Visit    None    Visit Diagnoses    Acute hip pain, left    -  Primary   Relevant Medications   cyclobenzaprine (FLEXERIL) 10 MG tablet   Other Relevant Orders   DG HIP UNILAT WITH PELVIS 2-3 VIEWS LEFT (Completed)   Fall, initial encounter          Acute L Hip pain, likely contusion from fall 3 days ago with localized tenderness, however w/o ecchymosis - Known OA/DJD of lumbar spine, 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 -  No radiation of pain or radicular symptoms - Inadequate conservative therapy   Plan: 1. STAT L Hip X-ray to rule out fracture - reviewed results above, patient was called, negative fracture - Reassurance, since somewhat improved, may continue conservative care - Use Tylenol 1g TID PRN, limit NSAIDs - Start muscle relaxant with Flexeril 10mg  tabs - take 5-10mg  nightly PRN, titrate up as tolerated - caution sedation may help him relax and sleep at night, can try this few nights prior to his upcoming procedure to help keep him calm - use moist heat - Given handout for hip exercises and to modify activities - Follow-up if not improving as planned  If need additional therapy prior to his surgery due to anxiety, advised we could consider hydroxyzine, or if insomnia possible short term ambien for few nights only   Meds ordered this encounter  Medications  . cyclobenzaprine (FLEXERIL) 10 MG tablet    Sig: Take 1 tablet (10 mg total) by mouth at bedtime as needed for muscle spasms.    Dispense:  30 tablet    Refill:  0    Follow up plan: Return if symptoms worsen or fail to improve, for hip pain.  He was  advised to schedule future follow-up for Diabetes management after his bladder surgery  Nobie Putnam, [facility] 09/15/2017, 3:47 PM

## 2017-09-15 NOTE — Patient Instructions (Addendum)
Thank you for coming to the office today.  We will check X-ray for your Left Hip - stay tuned for result later today or tomorrow once we get it  Start Cyclobenzapine (Flexeril) 10mg  tablets (muscle relaxant) - start with half (cut) to one whole pill at night for muscle relaxant - may make you sedated or sleepy (be careful driving or working on this) if tolerated you can take half to whole tab 2 to 3 times daily or every 8 hours as needed  Recommend to start taking Tylenol Extra Strength 500mg  tabs - take 1 to 2 tabs per dose (max 1000mg ) every 6-8 hours for pain (take regularly, don't skip a dose for next 7 days), max 24 hour daily dose is 6 tablets or 3000mg . In the future you can repeat the same everyday Tylenol course for 1-2 weeks at a time.   Let us know if you need a different med to help sleep before your surgery - can consider Hydroxyzine or possibly a short term Ambien.   Please schedule a Follow-up Appointment to: Return if symptoms worsen or fail to improve, for hip pain.  If you have any other questions or concerns, please feel free to call the office or send a message through [place]. You may also schedule an earlier appointment if necessary.  Additionally, you may be receiving a survey about your experience at our office within a few days to 1 week by e-mail or mail. We value your feedback.  Richard Putnam, DO [facility], [facility]    Hip Exercises Ask your health care provider which exercises are safe for you. Do exercises exactly as told by your health care provider and adjust them as directed. It is normal to feel mild stretching, pulling, tightness, or discomfort as you do these exercises, but you should stop right away if you feel sudden pain or your pain gets worse.Do not begin these exercises until told by your health care provider. STRETCHING AND RANGE OF MOTION EXERCISES These exercises warm up your muscles and joints and improve the movement and  flexibility of your hip. These exercises also help to relieve pain, numbness, and tingling. Exercise A: Hamstrings, Supine  1. Lie on your back. 2. Loop a belt or towel over the ball of your left / rightfoot. The ball of your foot is on the walking surface, right under your toes. 3. Straighten your left / rightknee and slowly pull on the belt to raise your leg. ? Do not let your left / right knee bend while you do this. ? Keep your other leg flat on the floor. ? Raise the left / right leg until you feel a gentle stretch behind your left / right knee or thigh. 4. Hold this position for __________ seconds. 5. Slowly return your leg to the starting position. Repeat __________ times. Complete this stretch __________ times a day. Exercise B: Hip Rotators  1. Lie on your back on a firm surface. 2. Hold your left / right knee with your left / right hand. Hold your ankle with your other hand. 3. Gently pull your left / right knee and rotate your lower leg toward your other shoulder. ? Pull until you feel a stretch in your buttocks. ? Keep your hips and shoulders firmly planted while you do this stretch. 4. Hold this position for __________ seconds. Repeat __________ times. Complete this stretch __________ times a day. Exercise C: V-Sit (Hamstrings and Adductors)  1. Sit on the floor with your  legs extended in a large "V" shape. Keep your knees straight during this exercise. 2. Start with your head and chest upright, then bend at your waist to reach for your left foot (position A). You should feel a stretch in your right inner thigh. 3. Hold this position for __________ seconds. Then slowly return to the upright position. 4. Bend at your waist to reach forward (position B). You should feel a stretch behind both of your thighs and knees. 5. Hold this position for __________ seconds. Then slowly return to the upright position. 6. Bend at your waist to reach for your right foot (position C). You  should feel a stretch in your left inner thigh. 7. Hold this position for __________ seconds. Then slowly return to the upright position. Repeat __________ times. Complete this stretch __________ times a day. Exercise D: Lunge (Hip Flexors)  1. Place your left / right knee on the floor and bend your other knee so that is directly over your ankle. You should be half-kneeling. 2. Keep good posture with your head over your shoulders. 3. Tighten your buttocks to point your tailbone downward. This helps your back to keep from arching too much. 4. You should feel a gentle stretch in the front of your left / right thigh and hip. If you do not feel any resistance, slightly slide your other foot forward and then slowly lunge forward so your knee once again lines up over your ankle. 5. Make sure your tailbone continues to point downward. 6. Hold this position for __________ seconds. Repeat __________ times. Complete this stretch __________ times a day. STRENGTHENING EXERCISES These exercises build strength and endurance in your hip. Endurance is the ability to use your muscles for a long time, even after they get tired. Exercise E: Bridge (Hip Extensors)  1. Lie on your back on a firm surface with your knees bent and your feet flat on the floor. 2. Tighten your buttocks muscles and lift your bottom off the floor until the trunk of your body is level with your thighs. ? Do not arch your back. ? You should feel the muscles working in your buttocks and the back of your thighs. If you do not feel these muscles, slide your feet 1-2 inches (2.5-5 cm) farther away from your buttocks. 3. Hold this position for __________ seconds. 4. Slowly lower your hips to the starting position. 5. Let your muscles relax completely between repetitions. 6. If this exercise is too easy, try doing it with your arms crossed over your chest. Repeat __________ times. Complete this exercise __________ times a day. Exercise F:  Straight Leg Raises - Hip Abductors  1. Lie on your side with your left / right leg in the top position. Lie so your head, shoulder, knee, and hip line up with each other. You may bend your bottom knee to help you balance. 2. Roll your hips slightly forward, so your hips are stacked directly over each other and your left / right knee is facing forward. 3. Leading with your heel, lift your top leg 4-6 inches (10-15 cm). You should feel the muscles in your outer hip lifting. ? Do not let your foot drift forward. ? Do not let your knee roll toward the ceiling. 4. Hold this position for __________ seconds. 5. Slowly return to the starting position. 6. Let your muscles relax completely between repetitions. Repeat __________ times. Complete this exercise __________ times a day. Exercise G: Straight Leg Raises - Hip Adductors  1.  Lie on your side with your left / right leg in the bottom position. Lie so your head, shoulder, knee, and hip line up. You may place your upper foot in front to help you balance. 2. Roll your hips slightly forward, so your hips are stacked directly over each other and your left / right knee is facing forward. 3. Tense the muscles in your inner thigh and lift your bottom leg 4-6 inches (10-15 cm). 4. Hold this position for __________ seconds. 5. Slowly return to the starting position. 6. Let your muscles relax completely between repetitions. Repeat __________ times. Complete this exercise __________ times a day. Exercise H: Straight Leg Raises - Quadriceps  1. Lie on your back with your left / right leg extended and your other knee bent. 2. Tense the muscles in the front of your left / right thigh. When you do this, you should see your kneecap slide up or see increased dimpling just above your knee. 3. Tighten these muscles even more and raise your leg 4-6 inches (10-15 cm) off the floor. 4. Hold this position for __________ seconds. 5. Keep these muscles tense as you  lower your leg. 6. Relax the muscles slowly and completely between repetitions. Repeat __________ times. Complete this exercise __________ times a day. Exercise I: Hip Abductors, Standing 1. Tie one end of a rubber exercise band or tubing to a secure surface, such as a table or pole. 2. Loop the other end of the band or tubing around your left / right ankle. 3. Keeping your ankle with the band or tubing directly opposite of the secured end, step away until there is tension in the tubing or band. Hold onto a chair as needed for balance. 4. Lift your left / right leg out to your side. While you do this: ? Keep your back upright. ? Keep your shoulders over your hips. ? Keep your toes pointing forward. ? Make sure to use your hip muscles to lift your leg. Do not "throw" your leg or tip your body to lift your leg. 5. Hold this position for __________ seconds. 6. Slowly return to the starting position. Repeat __________ times. Complete this exercise __________ times a day. Exercise J: Squats (Quadriceps) 1. Stand in a door frame so your feet and knees are in line with the frame. You may place your hands on the frame for balance. 2. Slowly bend your knees and lower your hips like you are going to sit in a chair. ? Keep your lower legs in a straight-up-and-down position. ? Do not let your hips go lower than your knees. ? Do not bend your knees lower than told by your health care provider. ? If your hip pain increases, do not bend as low. 3. Hold this position for ___________ seconds. 4. Slowly push with your legs to return to standing. Do not use your hands to pull yourself to standing. Repeat __________ times. Complete this exercise __________ times a day. This information is not intended to replace advice given to you by your health care provider. Make sure you discuss any questions you have with your health care provider. Document Released: 05/21/2005 Document Revised: 01/26/2016 Document  Reviewed: 04/28/2015 Elsevier Interactive Patient Education  Richard Oconnell.

## 2017-09-29 ENCOUNTER — Other Ambulatory Visit: Payer: Self-pay | Admitting: Family Medicine

## 2017-09-29 ENCOUNTER — Encounter: Payer: Self-pay | Admitting: Family Medicine

## 2017-09-29 ENCOUNTER — Ambulatory Visit (INDEPENDENT_AMBULATORY_CARE_PROVIDER_SITE_OTHER): Payer: BLUE CROSS/BLUE SHIELD | Admitting: Family Medicine

## 2017-09-29 VITALS — BP 140/82 | HR 72 | Temp 98.6°F | Resp 16 | Ht 75.0 in | Wt 257.8 lb

## 2017-09-29 DIAGNOSIS — E1142 Type 2 diabetes mellitus with diabetic polyneuropathy: Secondary | ICD-10-CM

## 2017-09-29 DIAGNOSIS — F5102 Adjustment insomnia: Secondary | ICD-10-CM

## 2017-09-29 DIAGNOSIS — C672 Malignant neoplasm of lateral wall of bladder: Secondary | ICD-10-CM

## 2017-09-29 DIAGNOSIS — E785 Hyperlipidemia, unspecified: Secondary | ICD-10-CM

## 2017-09-29 DIAGNOSIS — Z0000 Encounter for general adult medical examination without abnormal findings: Secondary | ICD-10-CM

## 2017-09-29 DIAGNOSIS — I10 Essential (primary) hypertension: Secondary | ICD-10-CM | POA: Diagnosis not present

## 2017-09-29 DIAGNOSIS — E1169 Type 2 diabetes mellitus with other specified complication: Secondary | ICD-10-CM

## 2017-09-29 DIAGNOSIS — Z125 Encounter for screening for malignant neoplasm of prostate: Secondary | ICD-10-CM

## 2017-09-29 LAB — POCT GLYCOSYLATED HEMOGLOBIN (HGB A1C): Hemoglobin A1C: 7.2 — AB (ref ?–5.7)

## 2017-09-29 MED ORDER — METFORMIN HCL 1000 MG PO TABS: 1000.0000 mg | ORAL_TABLET | Freq: Two times a day (BID) | ORAL | 3 refills | Status: DC

## 2017-09-29 MED ORDER — ZOLPIDEM TARTRATE 5 MG PO TABS: 5.0000 mg | ORAL_TABLET | Freq: Every evening | ORAL | 0 refills | Status: DC | PRN

## 2017-09-29 NOTE — Assessment & Plan Note (Signed)
Mild worsening DM control A1c up to 7.2 from prior 6.4 - likely due to poor lifestyle / sedentary recently due to other health problems and stress Complications - peripheral neuropathy  Plan:  1. Continue current therapy - Metformin 1000mg  BID - Consider future GLP - he prefers not to add any meds 2. Encourage improved lifestyle - low carb, low sugar diet, reduce portion size, restart regular exercise 3. Check CBG, bring log to next visit for review 4. Continue ACEi - discussed and offered ASA / Statin - due to elevated ASCVD risk, will defer for now pending bladder surgery 5. DM Foot exam done today / Advised to schedule DM ophtho exam, send record (however he declines due to cost at this time, self pay) 6. Follow-up 4 months Annual Phys + A1c

## 2017-09-29 NOTE — Progress Notes (Signed)
Subjective:    [name], male    DOB: Sep 17, 1955, 62 y.o.   MRN: [id number]  Roye Gustafson is a 62 y.o. male presenting on 09/29/2017 for Hypertension and Diabetes   HPI   CHRONIC DM, Type 2 with neuropathy Reportsconcern with likely elevated blood sugar due to some poor dietary and sedentary lifestyle. CBGs: no CBG readings today Meds: Metformin 1000mg  BID Reports good compliance. Tolerating well w/o side-effects Currently on ACEi Lifestyle: - Diet (admits needs to improve DM diet - he has not been following this recently due to other health illness, bladder cancer) - Exercise (limited regular exercise especially recently due to cystoscopy procedures and bladder cancer advised to avoid strenuous exercise) History of chronic neuropathy L>R foot - today seems to be improved Admits some urinary frequency Denies hypoglycemia  CHRONIC HTN: Reports no new concerns. Mildly elevated, he is stress today due to upcoming procedure Current Meds - No longer on medication - Off Lisiniopril 10mg  >1 year due to cough Reports good compliance, took meds today. Tolerating well, w/o complaints. Denies CP, dyspnea, HA, edema, dizziness / lightheadedness  Additional updates: - Follow-up Hip Pain, L - after fall, last seen 09/15/17 for this issue, X-ray negative for fracture, reassuring, trial on Flexeril, but he says "allergy" to this and did not tolerate it well, he felt "dottering" dry mouth angry and irritable, and stopped it - Insomnia vs Adjustment: He is asking again about med for help sleep for his nerves as needed for upcoming cystoscopy  Followed [facility] for Bladder Cancer - see background note for information, will review at upcoming Annual Physical  Depression screen [facility] 2/9 09/29/2017 03/04/2017 06/11/2015  Decreased Interest 0 0 0  Down, Depressed, Hopeless 0 0 0  PHQ - 2 Score 0 0 0    Social History   Tobacco Use  . Smoking status: Never Smoker    . Smokeless tobacco: Never Used  . Tobacco comment: once a while had one cigar >10 years ago  Substance Use Topics  . Alcohol use: No    Alcohol/week: 0.0 oz  . Drug use: No    Review of Systems Per HPI unless specifically indicated above     Objective:    BP 140/82   Pulse 72   Temp 98.6 F (37 C) (Oral)   Resp 16   Ht 6\' 3"  (1.905 m)   Wt 257 lb 12.8 oz (116.9 kg)   BMI 32.22 kg/m   Wt Readings from Last 3 Encounters:  09/29/17 257 lb 12.8 oz (116.9 kg)  09/15/17 252 lb (114.3 kg)  07/07/17 260 lb (117.9 kg)    Physical Exam  Constitutional: He is oriented to person, place, and time. He appears well-developed and well-nourished. No distress.  Well-appearing, comfortable, cooperative, obese  HENT:  Head: Normocephalic and atraumatic.  Mouth/Throat: Oropharynx is clear and moist.  Eyes: Conjunctivae are normal. Right eye exhibits no discharge. Left eye exhibits no discharge.  Cardiovascular: Normal rate.  Pulmonary/Chest: Effort normal.  Musculoskeletal: He exhibits no edema.  Neurological: He is alert and oriented to person, place, and time.  Skin: Skin is warm and dry. No rash noted. He is not diaphoretic. No erythema.  Psychiatric: He has a normal mood and affect. His behavior is normal.  Well groomed, good eye contact, normal speech and thoughts  Nursing note and vitals reviewed.    Diabetic Foot Exam - Simple   Simple Foot Form Diabetic Foot exam was performed with  the following findings:  Yes 09/29/2017 10:48 AM  Visual Inspection See comments:  Yes Sensation Testing See comments:  Yes Pulse Check Posterior Tibialis and Dorsalis pulse intact bilaterally:  Yes Comments Left toenail growing back, prior removal. No other deformity or callus. Mild reduced sensation to monofilament Left great toe otherwise intact sensation bilateral.    Recent Labs    11/24/16 0818 09/29/17 1043  HGBA1C 6.4* 7.2*    Results for orders placed or performed in visit on  09/29/17  POCT HgB A1C  Result Value Ref Range   Hemoglobin A1C 7.2 (A) 5.7      Assessment & Plan:   Problem List Items Addressed This Visit    Hypertension    Mildly elevated, off meds. Reaction to ACEi Lisinopril cough - stopped med No known complications    Plan:  1. Review meds again for BP after trend and return in 4 months - consider alternative med than ACEi - will recommend ARB 2. Encourage improved lifestyle - low sodium diet, regular exercise 3. Start monitor BP outside office, bring readings to next visit, if persistently >140/90 or new symptoms notify office sooner 4. Follow-up 4 months      Type 2 diabetes mellitus with diabetic polyneuropathy, without long-term current use of insulin (HCC) - Primary    Mild worsening DM control A1c up to 7.2 from prior 6.4 - likely due to poor lifestyle / sedentary recently due to other health problems and stress Complications - peripheral neuropathy  Plan:  1. Continue current therapy - Metformin 1000mg  BID - Consider future GLP - he prefers not to add any meds 2. Encourage improved lifestyle - low carb, low sugar diet, reduce portion size, restart regular exercise 3. Check CBG, bring log to next visit for review 4. Continue ACEi - discussed and offered ASA / Statin - due to elevated ASCVD risk, will defer for now pending bladder surgery 5. DM Foot exam done today / Advised to schedule DM ophtho exam, send record (however he declines due to cost at this time, self pay) 6. Follow-up 4 months Annual Phys + A1c      Relevant Medications   metFORMIN (GLUCOPHAGE) 1000 MG tablet   zolpidem (AMBIEN) 5 MG tablet   Other Relevant Orders   POCT HgB A1C (Completed)    Other Visit Diagnoses    Adjustment insomnia       Reviewed problem is not chronic, only acute due to anxiety/stress over last cystoscopy result, rx Ambien PRN #5 tabs prior to procedure, caution sedation   Relevant Medications   zolpidem (AMBIEN) 5 MG tablet        Meds ordered this encounter  Medications  . metFORMIN (GLUCOPHAGE) 1000 MG tablet    Sig: Take 1 tablet (1,000 mg total) by mouth 2 (two) times daily with a meal.    Dispense:  180 tablet    Refill:  3  . zolpidem (AMBIEN) 5 MG tablet    Sig: Take 1 tablet (5 mg total) by mouth at bedtime as needed for sleep.    Dispense:  5 tablet    Refill:  0     Follow up plan: Return in about 1 month (around 10/27/2017) for Annual Physical.  Future labs ordered for 01/2018  Nobie Putnam, [facility] 09/29/2017, 1:07 PM

## 2017-09-29 NOTE — Assessment & Plan Note (Signed)
Mildly elevated, off meds. Reaction to ACEi Lisinopril cough - stopped med No known complications    Plan:  1. Review meds again for BP after trend and return in 4 months - consider alternative med than ACEi - will recommend ARB 2. Encourage improved lifestyle - low sodium diet, regular exercise 3. Start monitor BP outside office, bring readings to next visit, if persistently >140/90 or new symptoms notify office sooner 4. Follow-up 4 months

## 2017-09-29 NOTE — Patient Instructions (Addendum)
Thank you for coming to the office today.  A1c is 7.2 - good result overall given the fact that you have not been keeping up with lifestyle diet / exercise  Good luck with your cystoscopy - rx Ambien 5mg  nightly as needed for sleep to help your anxiety and insomnia leading up to the procedure, be careful with sedation   DUE for FASTING BLOOD WORK (no food or drink after midnight before the lab appointment, only water or coffee without cream/sugar on the morning of)  SCHEDULE "Lab Only" visit in the morning at the clinic for lab draw in 4 MONTHS   - Make sure Lab Only appointment is at about 1 week before your next appointment, so that results will be available  For Lab Results, once available within 2-3 days of blood draw, you can can log in to MyChart online to view your results and a brief explanation. Also, we can discuss results at next follow-up visit.  Please schedule a Follow-up Appointment to: Return in about 1 month (around 10/27/2017) for Annual Physical.  If you have any other questions or concerns, please feel free to call the office or send a message through [place]. You may also schedule an earlier appointment if necessary.  Additionally, you may be receiving a survey about your experience at our office within a few days to 1 week by e-mail or mail. We value your feedback.  Nobie Putnam, DO [place]

## 2017-09-30 ENCOUNTER — Ambulatory Visit: Payer: BLUE CROSS/BLUE SHIELD | Admitting: Family Medicine

## 2017-10-04 ENCOUNTER — Other Ambulatory Visit: Payer: BLUE CROSS/BLUE SHIELD

## 2017-10-14 ENCOUNTER — Other Ambulatory Visit: Payer: BLUE CROSS/BLUE SHIELD

## 2017-10-14 ENCOUNTER — Other Ambulatory Visit: Payer: Self-pay

## 2017-10-14 ENCOUNTER — Encounter
Admission: RE | Admit: 2017-10-14 | Discharge: 2017-10-14 | Disposition: A | Discharge status: Home / Self Care | Payer: BLUE CROSS/BLUE SHIELD | Source: Ambulatory Visit | Attending: Urology | Admitting: Urology

## 2017-10-14 DIAGNOSIS — R9431 Abnormal electrocardiogram [ECG] [EKG]: Secondary | ICD-10-CM | POA: Diagnosis not present

## 2017-10-14 DIAGNOSIS — Z01810 Encounter for preprocedural cardiovascular examination: Secondary | ICD-10-CM | POA: Diagnosis present

## 2017-10-14 LAB — BASIC METABOLIC PANEL
Anion gap: 12 (ref 5–15)
BUN: 15 mg/dL (ref 6–20)
CO2: 22 mmol/L (ref 22–32)
Calcium: 9.1 mg/dL (ref 8.9–10.3)
Chloride: 101 mmol/L (ref 101–111)
Creatinine, Ser: 1.14 mg/dL (ref 0.61–1.24)
GFR calc Af Amer: >60 mL/min (ref >60)
GFR calc non Af Amer: >60 mL/min (ref >60)
Glucose, Bld: 159 mg/dL — ABNORMAL HIGH (ref 65–99)
Potassium: 4 mmol/L (ref 3.5–5.1)
Sodium: 135 mmol/L (ref 135–145)

## 2017-10-14 LAB — URINALYSIS, ROUTINE W REFLEX MICROSCOPIC
Bilirubin Urine: NEGATIVE
Glucose, UA: NEGATIVE mg/dL
Hgb urine dipstick: NEGATIVE
Ketones, ur: NEGATIVE mg/dL
Leukocytes, UA: NEGATIVE
Nitrite: NEGATIVE
Protein, ur: NEGATIVE mg/dL
Specific Gravity, Urine: 1.019 (ref 1.005–1.030)
pH: 5 (ref 5.0–8.0)

## 2017-10-14 LAB — CBC
HCT: 46.9 % (ref 40.0–52.0)
Hemoglobin: 15.9 g/dL (ref 13.0–18.0)
MCH: 30 pg (ref 26.0–34.0)
MCHC: 33.9 g/dL (ref 32.0–36.0)
MCV: 88.3 fL (ref 80.0–100.0)
Platelets: 262 10*3/uL (ref 150–440)
RBC: 5.31 MIL/uL (ref 4.40–5.90)
RDW: 13.4 % (ref 11.5–14.5)
WBC: 7.5 10*3/uL (ref 3.8–10.6)

## 2017-10-14 NOTE — Patient Instructions (Signed)
Your procedure is scheduled on: Monday, October 24, 2017 Report to Day Surgery on the 2nd floor of the [facility]. To find out your arrival time, please call [phone] between 1PM - 3PM on: Friday, October 21, 2017  REMEMBER: Instructions that are not followed completely may result in serious medical risk, up to and including death; or upon the discretion of your surgeon and anesthesiologist your surgery may need to be rescheduled.  Do not eat food after midnight the night before your procedure.  No gum chewing, lozengers or hard candies.  You may however, drink water up to 2 hours before you are scheduled to arrive for your surgery. Do not drink anything within 2 hours of the start of your surgery.  No Alcohol for 24 hours before or after surgery.  No Smoking including e-cigarettes for 24 hours prior to surgery.  No chewable tobacco products for at least 6 hours prior to surgery.  No nicotine patches on the day of surgery.  On the morning of surgery brush your teeth with toothpaste and water, you may rinse your mouth with mouthwash if you wish. Do not swallow any toothpaste or mouthwash.  Notify your doctor if there is any change in your medical condition (cold, fever, infection).  Do not wear jewelry, make-up, hairpins, clips or nail polish.  Do not wear lotions, powders, or perfumes. You may wear deodorant.  Do not shave 48 hours prior to surgery. Men may shave face and neck.  Contacts and dentures may not be worn into surgery.  Do not bring valuables to the hospital, including drivers license, insurance or credit cards.  [facility] is not responsible for any belongings or valuables.   TAKE THESE MEDICATIONS THE MORNING OF SURGERY:  None  Stop Metformin 2 days prior to surgery. Last day to take is Friday, October 21, 2017.  On June 3 - Stop Anti-inflammatories (NSAIDS) such as Advil, Aleve, Ibuprofen, Motrin, Naproxen, Naprosyn and Aspirin based products such as Excedrin, Goodys  Powder, BC Powder. (May take Tylenol or Acetaminophen if needed.)  On June 3 - Stop ANY OVER THE COUNTER supplements until after surgery.  Wear comfortable clothing (specific to your surgery type) to the hospital.  Plan for stool softeners for home use.  If you are being discharged the day of surgery, you will not be allowed to drive home. You will need a responsible adult to drive you home and stay with you that night.   If you are taking public transportation, you will need to have a responsible adult with you. Please confirm with your physician that it is acceptable to use public transportation.   Please call [phone] if you have any questions about these instructions.

## 2017-10-15 LAB — URINE CULTURE: Culture: NO GROWTH

## 2017-10-17 ENCOUNTER — Telehealth: Payer: Self-pay | Admitting: Family Medicine

## 2017-10-17 DIAGNOSIS — F5102 Adjustment insomnia: Secondary | ICD-10-CM

## 2017-10-17 MED ORDER — ZOLPIDEM TARTRATE 5 MG PO TABS: 5.0000 mg | ORAL_TABLET | Freq: Every evening | ORAL | 0 refills | Status: DC | PRN

## 2017-10-17 NOTE — Telephone Encounter (Signed)
Pt. Call ambien  5 mg  Called into  [place] [address]. Pt  States surgery is not until next monday

## 2017-10-17 NOTE — Telephone Encounter (Signed)
Agree to refill Ambien 5mg  nightly PRN due to anxiety with insomnia related to upcoming procedure to determine if cancer free.  Nobie Putnam, [facility] 10/17/2017, 12:09 PM

## 2017-10-18 NOTE — Pre-Procedure Instructions (Signed)
No recent H&P noted on chart.  Need H&P.  Date of surgery 10/24/17.

## 2017-10-23 MED ORDER — CEFAZOLIN SODIUM-DEXTROSE 2-4 GM/100ML-% IV SOLN
2.0000 g | INTRAVENOUS | Status: AC
  Administered 2017-10-24: 2 g via INTRAVENOUS

## 2017-10-24 ENCOUNTER — Encounter
Admission: RE | Disposition: A | Discharge status: Home / Self Care | Payer: Self-pay | Source: Ambulatory Visit | Attending: Urology

## 2017-10-24 ENCOUNTER — Ambulatory Visit
Admission: RE | Admit: 2017-10-24 | Discharge: 2017-10-24 | Disposition: A | Discharge status: Home / Self Care | Payer: BLUE CROSS/BLUE SHIELD | Source: Ambulatory Visit | Attending: Urology | Admitting: Urology

## 2017-10-24 ENCOUNTER — Ambulatory Visit: Payer: BLUE CROSS/BLUE SHIELD | Admitting: Anesthesiology

## 2017-10-24 DIAGNOSIS — Z79899 Other long term (current) drug therapy: Secondary | ICD-10-CM | POA: Diagnosis not present

## 2017-10-24 DIAGNOSIS — N3289 Other specified disorders of bladder: Secondary | ICD-10-CM | POA: Diagnosis not present

## 2017-10-24 DIAGNOSIS — E1142 Type 2 diabetes mellitus with diabetic polyneuropathy: Secondary | ICD-10-CM | POA: Diagnosis not present

## 2017-10-24 DIAGNOSIS — N3090 Cystitis, unspecified without hematuria: Secondary | ICD-10-CM | POA: Insufficient documentation

## 2017-10-24 DIAGNOSIS — N529 Male erectile dysfunction, unspecified: Secondary | ICD-10-CM | POA: Diagnosis not present

## 2017-10-24 DIAGNOSIS — Z8551 Personal history of malignant neoplasm of bladder: Secondary | ICD-10-CM | POA: Diagnosis not present

## 2017-10-24 DIAGNOSIS — Z7984 Long term (current) use of oral hypoglycemic drugs: Secondary | ICD-10-CM | POA: Diagnosis not present

## 2017-10-24 DIAGNOSIS — I10 Essential (primary) hypertension: Secondary | ICD-10-CM | POA: Insufficient documentation

## 2017-10-24 DIAGNOSIS — C679 Malignant neoplasm of bladder, unspecified: Secondary | ICD-10-CM

## 2017-10-24 HISTORY — PX: CYSTOSCOPY WITH BIOPSY: SHX5122

## 2017-10-24 LAB — GLUCOSE, CAPILLARY
Glucose-Capillary: 138 mg/dL — ABNORMAL HIGH (ref 65–99)
Glucose-Capillary: 164 mg/dL — ABNORMAL HIGH (ref 65–99)

## 2017-10-24 SURGERY — CYSTOSCOPY, WITH BIOPSY
Anesthesia: General | Wound class: Clean Contaminated

## 2017-10-24 MED ORDER — FAMOTIDINE 20 MG PO TABS
20.0000 mg | ORAL_TABLET | Freq: Once | ORAL | Status: AC
  Administered 2017-10-24: 20 mg via ORAL

## 2017-10-24 MED ORDER — LIDOCAINE HCL (PF) 2 % IJ SOLN
INTRAMUSCULAR | Status: AC
  Filled 2017-10-24: qty 10

## 2017-10-24 MED ORDER — CEFAZOLIN SODIUM-DEXTROSE 2-4 GM/100ML-% IV SOLN
INTRAVENOUS | Status: AC
  Filled 2017-10-24: qty 100

## 2017-10-24 MED ORDER — PROPOFOL 10 MG/ML IV BOLUS
INTRAVENOUS | Status: DC | PRN
  Administered 2017-10-24: 200 mg via INTRAVENOUS

## 2017-10-24 MED ORDER — FENTANYL CITRATE (PF) 100 MCG/2ML IJ SOLN
INTRAMUSCULAR | Status: DC | PRN
  Administered 2017-10-24 (×2): 50 ug via INTRAVENOUS

## 2017-10-24 MED ORDER — PROPOFOL 10 MG/ML IV BOLUS
INTRAVENOUS | Status: AC
  Filled 2017-10-24: qty 20

## 2017-10-24 MED ORDER — FENTANYL CITRATE (PF) 100 MCG/2ML IJ SOLN: 25.0000 ug | INTRAMUSCULAR | Status: DC | PRN

## 2017-10-24 MED ORDER — HYDROCODONE-ACETAMINOPHEN 5-325 MG PO TABS: 1.0000 | ORAL_TABLET | Freq: Four times a day (QID) | ORAL | 0 refills | Status: DC | PRN

## 2017-10-24 MED ORDER — SODIUM CHLORIDE 0.9 % IV SOLN
INTRAVENOUS | Status: DC
  Administered 2017-10-24: 09:00:00 via INTRAVENOUS

## 2017-10-24 MED ORDER — LIDOCAINE HCL (CARDIAC) PF 100 MG/5ML IV SOSY
PREFILLED_SYRINGE | INTRAVENOUS | Status: DC | PRN
  Administered 2017-10-24: 100 mg via INTRAVENOUS

## 2017-10-24 MED ORDER — ONDANSETRON HCL 4 MG/2ML IJ SOLN: 4.0000 mg | Freq: Once | INTRAMUSCULAR | Status: DC | PRN

## 2017-10-24 MED ORDER — ONDANSETRON HCL 4 MG/2ML IJ SOLN
INTRAMUSCULAR | Status: AC
  Filled 2017-10-24: qty 2

## 2017-10-24 MED ORDER — FAMOTIDINE 20 MG PO TABS
ORAL_TABLET | ORAL | Status: AC
  Administered 2017-10-24: 20 mg via ORAL
  Filled 2017-10-24: qty 1

## 2017-10-24 MED ORDER — FENTANYL CITRATE (PF) 100 MCG/2ML IJ SOLN
INTRAMUSCULAR | Status: AC
  Filled 2017-10-24: qty 2

## 2017-10-24 MED ORDER — ONDANSETRON HCL 4 MG/2ML IJ SOLN
INTRAMUSCULAR | Status: DC | PRN
  Administered 2017-10-24: 4 mg via INTRAVENOUS

## 2017-10-24 SURGICAL SUPPLY — 17 items
BAG DRAIN CYSTO-URO LG1000N (MISCELLANEOUS) ×2 IMPLANT
BRUSH SCRUB EZ  4% CHG (MISCELLANEOUS) ×1
BRUSH SCRUB EZ 4% CHG (MISCELLANEOUS) ×1 IMPLANT
DRSG TELFA 4X3 1S NADH ST (GAUZE/BANDAGES/DRESSINGS) ×2 IMPLANT
ELECT REM PT RETURN 9FT ADLT (ELECTROSURGICAL) ×2
ELECTRODE REM PT RTRN 9FT ADLT (ELECTROSURGICAL) ×1 IMPLANT
GLOVE BIO SURGEON STRL SZ 6.5 (GLOVE) ×2 IMPLANT
GOWN STRL REUS W/ TWL LRG LVL3 (GOWN DISPOSABLE) ×2 IMPLANT
GOWN STRL REUS W/TWL LRG LVL3 (GOWN DISPOSABLE) ×2
KIT TURNOVER CYSTO (KITS) ×2 IMPLANT
NDL SAFETY ECLIPSE 18X1.5 (NEEDLE) ×1 IMPLANT
NEEDLE HYPO 18GX1.5 SHARP (NEEDLE) ×1
PACK CYSTO AR (MISCELLANEOUS) ×2 IMPLANT
SET CYSTO W/LG BORE CLAMP LF (SET/KITS/TRAYS/PACK) ×2 IMPLANT
SURGILUBE 2OZ TUBE FLIPTOP (MISCELLANEOUS) ×2 IMPLANT
WATER STERILE IRR 1000ML POUR (IV SOLUTION) ×2 IMPLANT
WATER STERILE IRR 3000ML UROMA (IV SOLUTION) ×2 IMPLANT

## 2017-10-24 NOTE — Op Note (Signed)
Date of procedure: 10/24/17  Preoperative diagnosis:  1. History of bladder cancer 2. Recurrent CIS of the bladder  Postoperative diagnosis:  1. Same as above  Procedure: 1. Cystoscopy, bladder biopsy  Surgeon: Hollice Espy, MD  Anesthesia: General  Complications: None  Intraoperative findings: Nonspecific erythema on the left lateral bladder wall adjacent to a diverticulum, previous site of tumor and CIS persistence.  No papillary tumor.  EBL: Minimal  Specimens: Bladder biopsy  Drains: None  Indication: Richard Oconnell is a 62 y.o. patient with history of bladder cancer with persistent CIS status post a second induction course of BCG.  After reviewing the management options for treatment, he elected to proceed with the above surgical procedure(s). We have discussed the potential benefits and risks of the procedure, side effects of the proposed treatment, the likelihood of the patient achieving the goals of the procedure, and any potential problems that might occur during the procedure or recuperation. Informed consent has been obtained.  Description of procedure:  The patient was taken to the operating room and general anesthesia was induced.  The patient was placed in the dorsal lithotomy position, prepped and draped in the usual sterile fashion, and preoperative antibiotics were administered. A preoperative time-out was performed.   A 21 French cystoscope was advanced per urethra into the bladder.  The bladder was carefully inspected and noted to be free of any papillary tumor.  On the left lateral wall the bladder were previous tumor had been at the site of a bladder diverticulum, there was nonspecific erythema surrounding this area which is the site of previous CIS and cancer.  Cold cup biopsy forceps were used to to take approximately 8 or so biopsies of the mucosa and the area surrounding the diverticulum specifically at the area of erythema to assess for any residual  CIS.  The scope was able to be passed into the diverticulum itself which was approximately 21 French diameter, no tumor was identified within this lesion.  The remainder of the bladder was free of any tumors, erythema, or any other suspicious findings.  The trigone was normal.  There is clear reflux of both ureters.  Hemostasis was then carefully achieved using Bugbee electrocautery.  The bladder was then drained, the scope was removed, the patient was clean and dry, repositioned in supine position, reversed from anesthesia, taken to PACU in stable condition.  Plan: Findings were discussed with the sister today.  Will return next week to review pathology.  There is persistent CIS, he will either need to pursue cystectomy versus clinical trial.  If this is negative, we will do close surveillance cystoscopy with maintenance BCG.  Hollice Espy, M.D.

## 2017-10-24 NOTE — Discharge Instructions (Signed)

## 2017-10-24 NOTE — Anesthesia Post-op Follow-up Note (Signed)
Anesthesia QCDR form completed.        

## 2017-10-24 NOTE — H&P (Addendum)
10/24/17  Richard Oconnell 03/27/1956 [id number]  Referring provider: [name], DO [address], [place] [address]    HPI:  62 year old male with recurrent bladder cancer who returns for bladder biopsy.    He initially presented with gross hematuria and was taken to the operating room for TURBT x2 on 11/2016 and 01/2017.  Intraoperatively, clot was removed from the bladder. Upon inspection of his bladder, there are 2 discrete tumors in the bladder, one measuring 2.5 cm in the left lateral wall just superior to the bladder diverticulum highly suspicious for an invasive tumor. In addition to this, there was a velvety patch just below the diverticulum as well as a papillary lesion within the diverticulum itself.    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  Re-TUR BT showed persistent high-grade without upstaging.  He underwent induction BCG which was well-tolerated.  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  Most recently, he returned at which time multiple areas of bladder biopsy.  Showed evidence of persistent CIS.  He completed a second induction course of BCG x 6 completed on 09/12/17.     PMH:     Past Medical History:  Diagnosis Date  . Cancer [facility])    Bladder Cancer  . Erectile dysfunction associated with type 2 diabetes mellitus ([place]) 05/26/2016  . Gross hematuria 11/16/2016  . Hypertension 06/11/2015  . Type 2 diabetes mellitus with diabetic polyneuropathy, without long-term current use of insulin ([place]) 06/11/2015    Surgical History:      Past Surgical History:  Procedure Laterality Date  . boil lanceted     hand  . CYSTOSCOPY W/ RETROGRADES Bilateral 12/06/2016   Procedure: CYSTOSCOPY WITH RETROGRADE PYELOGRAM;  Surgeon: Hollice Espy, MD;  Location: [facility] ORS;   Service: Urology;  Laterality: Bilateral;  . CYSTOSCOPY W/ RETROGRADES Bilateral 06/27/2017   Procedure: CYSTOSCOPY WITH RETROGRADE PYELOGRAM;  Surgeon: Hollice Espy, MD;  Location: [facility] ORS;  Service: Urology;  Laterality: Bilateral;  . CYSTOSCOPY WITH BIOPSY N/A 06/27/2017   Procedure: CYSTOSCOPY WITH Bladder BIOPSY;  Surgeon: Hollice Espy, MD;  Location: [facility] ORS;  Service: Urology;  Laterality: N/A;  . ingrown  Bilateral    ingrown toenail  . TRANSURETHRAL RESECTION OF BLADDER TUMOR N/A 12/06/2016   Procedure: TRANSURETHRAL RESECTION OF BLADDER TUMOR (TURBT) (2-5cm) CLOT EVACUATION;  Surgeon: Hollice Espy, MD;  Location: [facility] ORS;  Service: Urology;  Laterality: N/A;  . TRANSURETHRAL RESECTION OF BLADDER TUMOR N/A 06/27/2017   Procedure: TRANSURETHRAL RESECTION OF BLADDER TUMOR (TURBT);  Surgeon: Hollice Espy, MD;  Location: [facility] ORS;  Service: Urology;  Laterality: N/A;  . TRANSURETHRAL RESECTION OF BLADDER TUMOR WITH MITOMYCIN-C N/A 01/18/2017   Procedure: TRANSURETHRAL RESECTION OF BLADDER TUMOR WITH MITOMYCIN-C-(SMALL);  Surgeon: Hollice Espy, MD;  Location: [facility] ORS;  Service: Urology;  Laterality: N/A;    Home Medications:  Allergies as of 07/07/2017   No Known Allergies                            Medication List             Accurate as of 07/07/17 11:56 AM. Always use your most recent med list.           acetaminophen 500 MG tablet Commonly known as:  TYLENOL Take 1,000 mg by mouth every 8 (eight) hours  as needed for mild pain.   calcium carbonate 500 MG chewable tablet Commonly known as:  TUMS - dosed in mg elemental calcium Chew 2 tablets by mouth daily as needed for indigestion or heartburn.   diphenhydramine-acetaminophen 25-500 MG Tabs tablet Commonly known as:  TYLENOL PM Take 2 tablets by mouth at bedtime as needed (sleep).   metFORMIN 1000 MG tablet Commonly known as:  GLUCOPHAGE Take 1 tablet (1,000 mg total) by mouth 2  (two) times daily with a meal.   oxybutynin 5 MG tablet Commonly known as:  DITROPAN Take 1 tablet (5 mg total) by mouth every 8 (eight) hours as needed for bladder spasms.       Allergies: No Known Allergies  Family History:      Family History  Problem Relation Age of Onset  . Heart attack Mother   . Heart disease Mother   . Parkinson's disease Father   . Heart attack Maternal Grandfather   . Heart disease Maternal Grandfather   . Heart attack Maternal Aunt   . Heart attack Maternal Grandmother   . Prostate cancer Neg Hx   . Bladder Cancer Neg Hx   . Kidney cancer Neg Hx     Social History:  reports that  has never smoked. he has never used smokeless tobacco. He reports that he does not drink alcohol or use drugs.  ROS: UROLOGY Frequent Urination?: No Hard to postpone urination?: No Burning/pain with urination?: No Get up at night to urinate?: No Leakage of urine?: No Urine stream starts and stops?: No Trouble starting stream?: No Do you have to strain to urinate?: No Blood in urine?: No Urinary tract infection?: No Sexually transmitted disease?: No Injury to kidneys or bladder?: No Painful intercourse?: No Weak stream?: No Erection problems?: No Penile pain?: No  Gastrointestinal Nausea?: No Vomiting?: No Indigestion/heartburn?: No Diarrhea?: No Constipation?: No  Constitutional Fever: No Night sweats?: No Weight loss?: No Fatigue?: No  Skin Skin rash/lesions?: No Itching?: No  Eyes Blurred vision?: No Double vision?: No  Ears/Nose/Throat Sore throat?: No Sinus problems?: No  Hematologic/Lymphatic Swollen glands?: No Easy bruising?: No  Cardiovascular Leg swelling?: No Chest pain?: No  Respiratory Cough?: No Shortness of breath?: No  Endocrine Excessive thirst?: No  Musculoskeletal Back pain?: No Joint pain?: No  Neurological Headaches?: No Dizziness?: No  Psychologic Depression?:  No Anxiety?: No  Physical Exam: BP (!) 166/84   Pulse 94   Ht 6\' 3"  (1.905 m)   Wt 260 lb (117.9 kg)   BMI 32.50 kg/m   Constitutional:  Alert and oriented, No acute distress.  Presents today accompanied by sister. HEENT: [place] AT, moist mucus membranes.  Trachea midline, no masses. Cardiovascular: No clubbing, cyanosis, or edema. RRR. Respiratory: Normal respiratory effort, no increased work of breathing. CTAB. Skin: No rashes, bruises or suspicious lesions. Neurologic: Grossly intact, no focal deficits, moving all 4 extremities. Psychiatric: Normal mood and affect.  Laboratory Data: RecentLabs       Lab Results  Component Value Date   WBC 8.9 11/23/2016   HGB 14.3 11/23/2016   HCT 42.4 11/23/2016   MCV 87.8 11/23/2016   PLT 300 11/23/2016      RecentLabs       Lab Results  Component Value Date   CREATININE 1.03 11/24/2016      RecentLabs       Lab Results  Component Value Date   HGBA1C 6.4 (H) 11/24/2016      Urinalysis N/a  Pertinent Imaging:  CT renal stone 11/1016  Assessment & Plan:    1. Malignant neoplasm of lateral wall of urinary bladder (HCC) Recurrent vs. More likely pertinent CIS following induction BCG with history of HgT1 disease Options including referral for cystectomy  Vs. Repeat induction BCG x 6 vs. 2nd line intravesical therapy- now s/p reinduction Risk and benefits of each were discussed as well as efficacy and response rates He would like to try BCG again and plan for repeat biopsy in ~ 2 month after completion- if fails, consider referral for clinical trial vs. Cystectomy All questions answered   Hollice Espy, MD  [facility] [address], [place],  [address] [phone]

## 2017-10-24 NOTE — Transfer of Care (Signed)
Immediate Anesthesia Transfer of Care Note  [name]  Procedure(s) Performed: CYSTOSCOPY WITH Bladder BIOPSY (N/A )  Patient Location: PACU  Anesthesia Type:General  Level of Consciousness: awake and responds to stimulation  Airway & Oxygen Therapy: Patient Spontanous Breathing and Patient connected to face mask oxygen  Post-op Assessment: Report given to RN and Post -op Vital signs reviewed and stable  Post vital signs: Reviewed and stable  Last Vitals:  Vitals Value Taken Time  BP 135/96 10/24/2017  9:58 AM  Temp    Pulse 73 10/24/2017  9:58 AM  Resp 13 10/24/2017  9:58 AM  SpO2 98 % 10/24/2017  9:58 AM    Last Pain:  Vitals:   10/24/17 0956  TempSrc:   PainSc: 0-No pain         Complications: No apparent anesthesia complications

## 2017-10-24 NOTE — Anesthesia Procedure Notes (Signed)
Procedure Name: LMA Insertion Performed by: Taronda Comacho, CRNA Pre-anesthesia Checklist: Patient identified, Patient being monitored, Timeout performed, Emergency Drugs available and Suction available Patient Re-evaluated:Patient Re-evaluated prior to induction Oxygen Delivery Method: Circle system utilized Preoxygenation: Pre-oxygenation with 100% oxygen Induction Type: IV induction LMA: LMA inserted LMA Size: 4.5 Tube type: Oral Number of attempts: 1 Placement Confirmation: positive ETCO2 and breath sounds checked- equal and bilateral Tube secured with: Tape Dental Injury: Teeth and Oropharynx as per pre-operative assessment        

## 2017-10-24 NOTE — Anesthesia Preprocedure Evaluation (Signed)
Anesthesia Evaluation  Patient identified by MRN, date of birth, ID band Patient awake    Reviewed: Allergy & Precautions, NPO status , Patient's Chart, lab work & pertinent test results, reviewed documented beta blocker date and time   Airway Mallampati: III  TM Distance: >3 FB     Dental  (+) Chipped   Pulmonary           Cardiovascular hypertension, Pt. on medications      Neuro/Psych  Neuromuscular disease    GI/Hepatic   Endo/Other  diabetes, Type 2  Renal/GU      Musculoskeletal   Abdominal   Peds  Hematology   Anesthesia Other Findings Obese.  Reproductive/Obstetrics                             Anesthesia Physical Anesthesia Plan  ASA: III  Anesthesia Plan: General   Post-op Pain Management:    Induction: Intravenous  PONV Risk Score and Plan:   Airway Management Planned: LMA  Additional Equipment:   Intra-op Plan:   Post-operative Plan:   Informed Consent: I have reviewed the patients History and Physical, chart, labs and discussed the procedure including the risks, benefits and alternatives for the proposed anesthesia with the patient or authorized representative who has indicated his/her understanding and acceptance.     Plan Discussed with: CRNA  Anesthesia Plan Comments:         Anesthesia Quick Evaluation

## 2017-10-24 NOTE — Anesthesia Postprocedure Evaluation (Signed)
Anesthesia Post Note  [name]  Procedure(s) Performed: CYSTOSCOPY WITH Bladder BIOPSY (N/A )  Patient location during evaluation: PACU Anesthesia Type: General Level of consciousness: awake and alert Pain management: pain level controlled Vital Signs Assessment: post-procedure vital signs reviewed and stable Respiratory status: spontaneous breathing, nonlabored ventilation, respiratory function stable and patient connected to nasal cannula oxygen Cardiovascular status: blood pressure returned to baseline and stable Postop Assessment: no apparent nausea or vomiting Anesthetic complications: no     Last Vitals:  Vitals:   10/24/17 1027 10/24/17 1046  BP: (!) 143/92 134/86  Pulse:  66  Resp: 16 16  Temp: 36.8 C   SpO2: 99% 99%    Last Pain:  Vitals:   10/24/17 1027  TempSrc: Oral  PainSc: 0-No pain                 Rodina Pinales S

## 2017-10-25 ENCOUNTER — Encounter: Payer: Self-pay | Admitting: Urology

## 2017-10-25 ENCOUNTER — Telehealth: Payer: Self-pay | Admitting: Radiology

## 2017-10-25 LAB — SURGICAL PATHOLOGY

## 2017-10-25 NOTE — Telephone Encounter (Signed)
-----   Message from Hollice Espy, MD sent at 10/25/2017  1:24 PM EDT ----- No more cancer!!!!!!!!  Please reschedule visit for in 3 months with cystoscopy and let him know.    Hollice Espy, MD

## 2017-10-25 NOTE — Telephone Encounter (Signed)
Notified patient there is no more cancer & made appointment for 3 months with cystoscopy. Pt extends gratitude for the call & for Dr Cherrie Gauze expertise.

## 2017-10-26 ENCOUNTER — Telehealth: Payer: Self-pay

## 2017-10-26 NOTE — Telephone Encounter (Signed)
-----   Message from Hollice Espy, MD sent at 10/25/2017  1:24 PM EDT ----- No more cancer!!!!!!!!  Please reschedule visit for in 3 months with cystoscopy and let him know.    Hollice Espy, MD

## 2017-10-26 NOTE — Telephone Encounter (Signed)
Pt informed

## 2017-11-01 ENCOUNTER — Ambulatory Visit: Payer: BLUE CROSS/BLUE SHIELD | Admitting: Urology

## 2017-12-29 ENCOUNTER — Ambulatory Visit: Payer: BLUE CROSS/BLUE SHIELD | Admitting: Family Medicine

## 2017-12-29 ENCOUNTER — Encounter: Payer: Self-pay | Admitting: Family Medicine

## 2017-12-29 VITALS — BP 119/90 | HR 93 | Temp 98.6°F | Resp 16 | Ht 75.0 in | Wt 251.0 lb

## 2017-12-29 DIAGNOSIS — H9202 Otalgia, left ear: Secondary | ICD-10-CM

## 2017-12-29 DIAGNOSIS — T162XXA Foreign body in left ear, initial encounter: Secondary | ICD-10-CM

## 2017-12-29 NOTE — Patient Instructions (Addendum)
Thank you for coming to the office today.  Removed ear bud today  You have some irritation within the ear canal - but this should improve, if worsening and pain or drainage. We can try an ear drop antibiotic if needed  Your provider would like to you have your annual eye exam. Please contact your current eye doctor or here are some good options for you to contact.   [facility]   Address: [address] [place], [place] [address] Phone: [phone]  Website: [email or web address] [address], [place], [place] [address] Phone: [phone] [email or web address]  [facility]  Address: [place], [place], [place] [address] Phone: [phone]   [facility] [address] [place], [place] [address] Phone: [phone]  [facility] Address: [place], [place], [place] [address]  Phone: [phone]   Please schedule a Follow-up Appointment to: Return in about 3 months (around 03/31/2018) for DM A1c.  If you have any other questions or concerns, please feel free to call the office or send a message through [facility] [facility]. You may also schedule an earlier appointment if necessary.  Additionally, you may be receiving a survey about your experience at our office within a few days to 1 week by e-mail or mail. We value your feedback.  Nobie Putnam, DO [place]

## 2017-12-29 NOTE — Progress Notes (Signed)
Subjective:    [name], male    DOB: 05-Aug-1955, 62 y.o.   MRN: [id number]  Richard Oconnell is a 62 y.o. male presenting on 12/29/2017 for Ear Pain (left ear swimmer's ear as per patient could be cotton stuck)  Patient presents for a same day appointment.  HPI  Left Ear Pain / Foreign Body in ear canal - Reports new complaint onset 24 hours with L ear pain and felt like something is "stuck" within his ear, felt plugged but he can still hear. Not submerged in water and does not feel like liquid or drainage out of ear. - Denies any fever chills sinus pain or pressure congestion, reduced hearing, headache  Other updates - Last bladder biopsy from cysto was negative for malignancy, followed by [facility] - Diabetes follow-up will need to return as scheduled in 3 months, he is due for DM Eye and other screening, asked him to schedule  Depression screen [facility] 2/9 12/29/2017 09/29/2017 03/04/2017  Decreased Interest 0 0 0  Down, Depressed, Hopeless 0 0 0  PHQ - 2 Score 0 0 0    Social History   Tobacco Use  . Smoking status: Never Smoker  . Smokeless tobacco: Never Used  . Tobacco comment: once a while had one cigar >10 years ago  Substance Use Topics  . Alcohol use: No    Alcohol/week: 0.0 standard drinks  . Drug use: No    Review of Systems Per HPI unless specifically indicated above     Objective:    BP 119/90   Pulse 93   Temp 98.6 F (37 C) (Other (Comment))   Resp 16   Ht 6\' 3"  (1.905 m)   Wt 251 lb (113.9 kg)   BMI 31.37 kg/m   Wt Readings from Last 3 Encounters:  12/29/17 251 lb (113.9 kg)  10/24/17 240 lb (108.9 kg)  10/14/17 245 lb (111.1 kg)    Physical Exam  Constitutional: He is oriented to person, place, and time. He appears well-developed and well-nourished. No distress.  Well-appearing, comfortable, cooperative  HENT:  Head: Normocephalic and atraumatic.  Mouth/Throat: Oropharynx is clear and moist.  R ear canal normal and TM  normal.  L ear canal with foreign body black rubber ear-bud tip blocking/impacted within mid canal. See procedure  Repeat exam after removal shows some slightly erythematous or inflamed skin where this was wedged but otherwise TM is normal, no effusion no fluid and no debris or discharge.  Eyes: Conjunctivae are normal. Right eye exhibits no discharge. Left eye exhibits no discharge.  Cardiovascular: Normal rate.  Pulmonary/Chest: Effort normal.  Musculoskeletal: He exhibits no edema.  Neurological: He is alert and oriented to person, place, and time.  Skin: Skin is warm and dry. No rash noted. He is not diaphoretic. No erythema.  Psychiatric: He has a normal mood and affect. His behavior is normal.  Well groomed, good eye contact, normal speech and thoughts  Nursing note and vitals reviewed.   ________________________________________________________ PROCEDURE NOTE Date: 12/29/17 Left Ear Foreign Body Removal Discussed benefits and risks (including pain / discomforts, dizziness, minor abrasion of ear canal). Verbal consent given by patient. Medication:  None Performed by Dr Parks Ranger Identified foreign body within L ear canal. Used sterile pick-ups to gently grab the soft tip of the earbud foreign body and gently pulled it out without difficulty.   Results for orders placed or performed during the hospital encounter of 10/24/17  Glucose, capillary  Result Value  Ref Range   Glucose-Capillary 138 (H) 65 - 99 mg/dL  Glucose, capillary  Result Value Ref Range   Glucose-Capillary 164 (H) 65 - 99 mg/dL  Surgical pathology  Result Value Ref Range   SURGICAL PATHOLOGY      Surgical Pathology CASE: [id number] [name] Surgical Pathology Report     SPECIMEN SUBMITTED: A. Lateral wall, left  CLINICAL HISTORY: None provided  PRE-OPERATIVE DIAGNOSIS: Malignant neoplasm of urinary bladder  POST-OPERATIVE DIAGNOSIS: Same as pre-op     DIAGNOSIS: A.   BLADDER, LEFT LATERAL WALL; BIOPSY: - CYSTITIS. - NEGATIVE FOR ATYPIA AND MALIGNANCY.   GROSS DESCRIPTION: A. Labeled: Left lateral wall Received: In formalin Tissue fragment(s): Multiple Size: Aggregate, 1.1 x 0.3 x 0.1 cm Description: Rubbery pink fragments Entirely submitted in one cassette.     Final Diagnosis performed by Delorse Lek, MD.   Electronically signed 10/25/2017 10:37:11AM The electronic signature indicates that the named Attending Pathologist has evaluated the specimen  Technical component performed at [facility], [address] [place], [place] [address] Lab: [phone] Dir: Rush Farmer, MD, MMM  Professional component performed at [facility] , [facility], [place], [place], [place] [address] Lab: [phone] Dir: Dellia Nims. Rubinas, MD       Assessment & Plan:   Problem List Items Addressed This Visit    None    Visit Diagnoses    Left ear pain    -  Primary   Foreign body of left ear, initial encounter          L ear canal impacted foreign body ear bud tip - without complication Removal today without problem, see procedure note Considered preventative antibiotic otic drops, but given mild appearance of ear canal and he feels much better after procedure, agree to defer for now, can call back if not improving and still pain or ear drainage or otitis externa will add ciprodex or other otic antibiotic  Follow-up as needed  No orders of the defined types were placed in this encounter.   Follow up plan: Return in about 3 months (around 03/31/2018) for DM A1c.  Nobie Putnam, [facility] 12/29/2017, 12:01 PM

## 2018-01-05 ENCOUNTER — Ambulatory Visit: Payer: BLUE CROSS/BLUE SHIELD | Admitting: Family Medicine

## 2018-01-25 ENCOUNTER — Encounter: Payer: Self-pay | Admitting: Urology

## 2018-01-25 ENCOUNTER — Ambulatory Visit: Payer: BLUE CROSS/BLUE SHIELD | Admitting: Urology

## 2018-01-25 VITALS — BP 149/94 | HR 91 | Ht 75.0 in | Wt 244.0 lb

## 2018-01-25 DIAGNOSIS — Z8551 Personal history of malignant neoplasm of bladder: Secondary | ICD-10-CM | POA: Diagnosis not present

## 2018-01-25 LAB — URINALYSIS, COMPLETE
Bilirubin, UA: NEGATIVE
Glucose, UA: NEGATIVE
Ketones, UA: NEGATIVE
Leukocytes, UA: NEGATIVE
Nitrite, UA: NEGATIVE
Protein, UA: NEGATIVE
RBC, UA: NEGATIVE
Specific Gravity, UA: 1.015 (ref 1.005–1.030)
Urobilinogen, Ur: 0.2 mg/dL (ref 0.2–1.0)
pH, UA: 5 (ref 5.0–7.5)

## 2018-01-25 NOTE — Progress Notes (Signed)
   01/25/18  CC:  Chief Complaint  Patient presents with  . Cysto    HPI: 62 year old malewith recurrent bladder cancer who returns today for surveillance cystoscopy.    He initially presented with gross hematuria and was taken to the operating room for TURBT x2 on 11/2016 and 01/2017. Intraoperatively, clot was removed from the bladder. Upon inspection of his bladder, there are 2 discrete tumors in the bladder, one measuring 2.5 cm in the left lateral wall just superior to the bladder diverticulum highly suspicious for an invasive tumor. In addition to this, there was a velvety patch just below the diverticulum as well as a papillary lesion within the diverticulum itself.   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  He underwent induction BCG which was well-tolerated. 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  He returned at which time multiple areas of bladder biopsy on 06/2017. Showed evidence of persistent CIS.  He completed a second induction course of BCG x 6 completed on 09/12/17.   He returns to th eOR on 10/24/17 for cystoscopy, bladder biopsy which was negative for malignancy.    He returns today for routine surveillance.    Blood pressure (!) 149/94, pulse 91, height 6\' 3"  (1.905 m), weight 244 lb (110.7 kg). NED. A&Ox3.   No respiratory distress   Abd soft, NT, ND Normal phallus with bilateral descended testicles  Cystoscopy Procedure Note  Patient identification was confirmed, informed consent was obtained, and patient was prepped using Betadine solution.  Lidocaine jelly was administered per urethral meatus.    Preoperative abx where received prior to procedure.     Pre-Procedure: - Inspection reveals a normal caliber ureteral  meatus.  Procedure: The flexible cystoscope was introduced without difficulty - No urethral strictures/lesions are present. - Normal prostate  - Normal bladder neck - Bilateral ureteral orifices identified - Bladder mucosa  reveals no ulcers, tumors, or lesions.  There is a left lateral wall diverticulum was relatively narrow in diameter, unable to pass scope.  There is a stellate scar just on the lateral edge of this but no evidence of papillary changes or any tumor.  No significant bladder erythema appreciated. - No bladder stones - Mild trabeculation  Retroflexion unremarkable   Post-Procedure: - Patient tolerated the procedure well  Assessment/ Plan:  1. History of bladder cancer Personal history of high-grade T1 and CIS status post induction BCG x2 Most recent biopsy negative for any residual CIS Bladder is unremarkable today without concern for recurrence Urine cytology sent We will be aggressive in follow-up, q. 54-month cystoscopy We will try to arrange maintenance BCG but in light of national back order in office shortage, we will work to find a split dose ideally in the near future to keep him on maintenance, understands the challenges related to the national back order - Urinalysis, Complete  Return in about 3 months (around 04/26/2018) for cysto.   Hollice Espy, MD

## 2018-02-13 ENCOUNTER — Telehealth: Payer: Self-pay

## 2018-02-13 NOTE — Telephone Encounter (Signed)
Patient notified that we were able to book Maintenance dose of BCG 1/3 split dose. Patient verbalized understanding

## 2018-02-23 ENCOUNTER — Ambulatory Visit (INDEPENDENT_AMBULATORY_CARE_PROVIDER_SITE_OTHER): Payer: BLUE CROSS/BLUE SHIELD

## 2018-02-23 DIAGNOSIS — C679 Malignant neoplasm of bladder, unspecified: Secondary | ICD-10-CM | POA: Diagnosis not present

## 2018-02-23 LAB — URINALYSIS, COMPLETE
Bilirubin, UA: NEGATIVE
Ketones, UA: NEGATIVE
Leukocytes, UA: NEGATIVE
Nitrite, UA: NEGATIVE
Protein, UA: NEGATIVE
RBC, UA: NEGATIVE
Specific Gravity, UA: 1.02 (ref 1.005–1.030)
Urobilinogen, Ur: 0.2 mg/dL (ref 0.2–1.0)
pH, UA: 5.5 (ref 5.0–7.5)

## 2018-02-23 MED ORDER — BCG LIVE 50 MG IS SUSR
1.0800 mL | Freq: Once | INTRAVESICAL | Status: AC
  Administered 2018-02-23: 27 mg via INTRAVESICAL

## 2018-02-23 NOTE — Progress Notes (Signed)
BCG Bladder Instillation  BCG # Maint. 1  Due to Bladder Cancer patient is present today for a BCG treatment. Patient was cleaned and prepped in a sterile fashion with betadine and lidocaine 2% jelly was instilled into the urethra.  A 14FR catheter was inserted, urine return was noted 14ml, urine was yellow in color.  67ml of reconstituted BCG 1/3 split dose was instilled into the bladder. The catheter was then removed. Patient tolerated well, no complications were noted  Preformed by: Darrick Grinder, CMA and Fonnie Jarvis, CMA  Follow up/ Additional notes: 1 week

## 2018-02-28 ENCOUNTER — Other Ambulatory Visit: Payer: Self-pay | Admitting: Urology

## 2018-03-02 ENCOUNTER — Ambulatory Visit (INDEPENDENT_AMBULATORY_CARE_PROVIDER_SITE_OTHER): Payer: BLUE CROSS/BLUE SHIELD | Admitting: Family Medicine

## 2018-03-02 DIAGNOSIS — C679 Malignant neoplasm of bladder, unspecified: Secondary | ICD-10-CM | POA: Diagnosis not present

## 2018-03-02 MED ORDER — BCG LIVE 50 MG IS SUSR
0.2000 mL | Freq: Once | INTRAVESICAL | Status: AC
  Administered 2018-03-02: 5 mg via INTRAVESICAL

## 2018-03-02 NOTE — Progress Notes (Signed)
BCG Bladder Instillation  BCG # maint. #2  Due to Bladder Cancer patient is present today for a BCG treatment. Patient was cleaned and prepped in a sterile fashion with betadine and lidocaine 2% jelly was instilled into the urethra.  A 14FR catheter was inserted, urine return was noted 139ml, urine was yellow in color.  64ml of reconstituted BCG was instilled into the bladder. The catheter was then removed. Patient tolerated well, no complications were noted  Preformed by: Elberta Leatherwood, CMA  Follow up/ Additional notes: 1 week Maint. #3

## 2018-03-03 LAB — URINALYSIS, COMPLETE
Bilirubin, UA: NEGATIVE
Ketones, UA: NEGATIVE
Leukocytes, UA: NEGATIVE
Nitrite, UA: NEGATIVE
Protein, UA: NEGATIVE
RBC, UA: NEGATIVE
Specific Gravity, UA: 1.025 (ref 1.005–1.030)
Urobilinogen, Ur: 0.2 mg/dL (ref 0.2–1.0)
pH, UA: 6 (ref 5.0–7.5)

## 2018-03-03 LAB — MICROSCOPIC EXAMINATION
Bacteria, UA: NONE SEEN
Epithelial Cells (non renal): NONE SEEN /hpf (ref 0–10)
RBC, UA: NONE SEEN /hpf (ref 0–2)

## 2018-03-09 ENCOUNTER — Ambulatory Visit (INDEPENDENT_AMBULATORY_CARE_PROVIDER_SITE_OTHER): Payer: BLUE CROSS/BLUE SHIELD | Admitting: Family Medicine

## 2018-03-09 DIAGNOSIS — C679 Malignant neoplasm of bladder, unspecified: Secondary | ICD-10-CM

## 2018-03-09 LAB — URINALYSIS, COMPLETE
Bilirubin, UA: NEGATIVE
Ketones, UA: NEGATIVE
Leukocytes, UA: NEGATIVE
Nitrite, UA: NEGATIVE
Protein, UA: NEGATIVE
RBC, UA: NEGATIVE
Specific Gravity, UA: 1.015 (ref 1.005–1.030)
Urobilinogen, Ur: 0.2 mg/dL (ref 0.2–1.0)
pH, UA: 5.5 (ref 5.0–7.5)

## 2018-03-09 LAB — MICROSCOPIC EXAMINATION
Bacteria, UA: NONE SEEN
RBC, UA: NONE SEEN /hpf (ref 0–2)

## 2018-03-09 MED ORDER — BCG LIVE 50 MG IS SUSR
0.2000 mL | Freq: Once | INTRAVESICAL | Status: AC
  Administered 2018-03-09: 5 mg via INTRAVESICAL

## 2018-03-09 NOTE — Progress Notes (Signed)
BCG Bladder Instillation  BCG # 3  Due to Bladder Cancer patient is present today for a BCG treatment. Patient was cleaned and prepped in a sterile fashion with betadine and lidocaine 2% jelly was instilled into the urethra.  A 14FR catheter was inserted, urine return was noted 71ml, urine was yellow in color.  42ml of reconstituted BCG was instilled into the bladder. The catheter was then removed. Patient tolerated well, no complications were noted  Preformed by: Elberta Leatherwood, CMA, Fonnie Jarvis, CMA

## 2018-04-11 ENCOUNTER — Encounter: Payer: Self-pay | Admitting: Family Medicine

## 2018-04-11 ENCOUNTER — Ambulatory Visit (INDEPENDENT_AMBULATORY_CARE_PROVIDER_SITE_OTHER): Payer: BLUE CROSS/BLUE SHIELD | Admitting: Family Medicine

## 2018-04-11 VITALS — BP 125/83 | HR 93 | Temp 98.2°F | Resp 16 | Ht 75.0 in | Wt 239.0 lb

## 2018-04-11 DIAGNOSIS — E1169 Type 2 diabetes mellitus with other specified complication: Secondary | ICD-10-CM | POA: Diagnosis not present

## 2018-04-11 DIAGNOSIS — F5102 Adjustment insomnia: Secondary | ICD-10-CM | POA: Diagnosis not present

## 2018-04-11 DIAGNOSIS — E1142 Type 2 diabetes mellitus with diabetic polyneuropathy: Secondary | ICD-10-CM | POA: Diagnosis not present

## 2018-04-11 DIAGNOSIS — N521 Erectile dysfunction due to diseases classified elsewhere: Secondary | ICD-10-CM

## 2018-04-11 LAB — POCT GLYCOSYLATED HEMOGLOBIN (HGB A1C): Hemoglobin A1C: 6.4 % — AB (ref 4.0–5.6)

## 2018-04-11 MED ORDER — METFORMIN HCL 1000 MG PO TABS: 1000.0000 mg | ORAL_TABLET | Freq: Two times a day (BID) | ORAL | 0 refills | Status: DC

## 2018-04-11 MED ORDER — ZOLPIDEM TARTRATE 5 MG PO TABS: 5.0000 mg | ORAL_TABLET | Freq: Every evening | ORAL | 0 refills | Status: DC | PRN

## 2018-04-11 MED ORDER — SILDENAFIL CITRATE 20 MG PO TABS: ORAL_TABLET | ORAL | 0 refills | Status: DC

## 2018-04-11 NOTE — Progress Notes (Signed)
Subjective:    [name], male    DOB: 1955-07-02, 62 y.o.   MRN: [id number]  Richard Oconnell is a 62 y.o. male presenting on 04/11/2018 for Diabetes  Patient is changing insurance in 05/2018, and he states we will be out of network. He plans to switch and transition his Primary care to [facility] in [place].  HPI   CHRONIC DM, Type 2with neuropathy Improved his lifestyle significantly since last visit. Today A1c 6.4. Improved from last 7.2 CBGs:no CBG readings today Meds:Metformin 1000mg  BID Reports good compliance. Tolerating well w/o side-effects Previously on ACEi. Remains off now. He cannot void for microalbumin today, request to return. Lifestyle - Weight down about 12 lbs 3 months, he states naked home wt is 233 lb - Diet improved low sugar / low carb now - Increasing exercise routine, previously was off while on BCG treatment for bladder, and previously was not exercising regularly for few years prior History of chronic neuropathy L>R foot - today seems to be improved Admits some urinary frequency Denies hypoglycemia  CHRONIC HTN: Reportsno new concerns. Mildly elevated, he is stress today due to upcoming procedure Current Meds - No longer on medication - Off Lisiniopril 10mg  >1 year due to cough Reports good compliance, took meds today. Tolerating well, w/o complaints. Denies CP, dyspnea, HA, edema, dizziness / lightheadedness  Insomnia Request temporary refill on Ambien for insomnia, not using nightly.  ED Request re order generic viagra for ED. He has used in past with good results.  Health Maintenance: Will get Flu Shot from [name]  Depression screen [facility] 2/9 04/11/2018 12/29/2017 09/29/2017  Decreased Interest 0 0 0  Down, Depressed, Hopeless 0 0 0  PHQ - 2 Score 0 0 0    Social History   Tobacco Use  . Smoking status: Never Smoker  . Smokeless tobacco: Never Used  . Tobacco comment: once a while had one cigar >10 years  ago  Substance Use Topics  . Alcohol use: No    Alcohol/week: 0.0 standard drinks  . Drug use: No    Review of Systems Per HPI unless specifically indicated above     Objective:    BP 125/83   Pulse 93   Temp 98.2 F (36.8 C) (Oral)   Resp 16   Ht 6\' 3"  (1.905 m)   Wt 239 lb (108.4 kg)   BMI 29.87 kg/m   Wt Readings from Last 3 Encounters:  04/11/18 239 lb (108.4 kg)  01/25/18 244 lb (110.7 kg)  12/29/17 251 lb (113.9 kg)    Physical Exam  Constitutional: He is oriented to person, place, and time. He appears well-developed and well-nourished. No distress.  Well-appearing, comfortable, cooperative  HENT:  Head: Normocephalic and atraumatic.  Mouth/Throat: Oropharynx is clear and moist.  Eyes: Conjunctivae are normal. Right eye exhibits no discharge. Left eye exhibits no discharge.  Cardiovascular: Normal rate.  Pulmonary/Chest: Effort normal.  Musculoskeletal: He exhibits no edema.  Neurological: He is alert and oriented to person, place, and time.  Skin: Skin is warm and dry. No rash noted. He is not diaphoretic. No erythema.  Psychiatric: He has a normal mood and affect. His behavior is normal.  Well groomed, good eye contact, normal speech and thoughts  Nursing note and vitals reviewed.  Results for orders placed or performed in visit on 04/11/18  POCT HgB A1C  Result Value Ref Range   Hemoglobin A1C 6.4 (A) 4.0 - 5.6 %  Recent Labs    09/29/17 1043 04/11/18 1347  HGBA1C 7.2* 6.4*       Assessment & Plan:   Problem List Items Addressed This Visit    Erectile dysfunction associated with type 2 diabetes mellitus ([place]) Persistent issue with ED function, related to variety of factors Refill Sildenafil generic, printed rx    Relevant Medications   sildenafil (REVATIO) 20 MG tablet   metFORMIN (GLUCOPHAGE) 1000 MG tablet   Type 2 diabetes mellitus with diabetic polyneuropathy, without long-term current use of insulin ([facility]) - Primary  Improved A1c to  6.4 from 7.2 better control on improved lifestyle diet/exercise No hypoglycemia. Complications - peripheral neuropathy, other including hyperlipidemia specifically hypertriglyceridemia, obesity - increases risk of future cardiovascular complications   Plan:  1. Continue current therapy - Metformin 1000mg  BID 2. Encourage improved lifestyle - low carb, low sugar diet, reduce portion size, continue improving regular exercise 3. Check CBG, bring log to next visit for review 4. Due for urine microalbumin - offered today, he could not void, may return. 5. Advised to schedule DM ophtho exam, send record 6. Follow-up within 3-6 months, otherwise he plans to relocate to new PCP due to insurance in 05/2018     Relevant Medications   zolpidem (AMBIEN) 5 MG tablet   metFORMIN (GLUCOPHAGE) 1000 MG tablet   Other Relevant Orders   POCT HgB A1C (Completed)    Other Visit Diagnoses    Adjustment insomnia     Refill Zolpidem 5mg  nightly PRN - using infrequently issue with anxiety/insomnia    Relevant Medications   zolpidem (AMBIEN) 5 MG tablet      Meds ordered this encounter  Medications  . zolpidem (AMBIEN) 5 MG tablet    Sig: Take 1 tablet (5 mg total) by mouth at bedtime as needed for sleep.    Dispense:  15 tablet    Refill:  0  . sildenafil (REVATIO) 20 MG tablet    Sig: Take 1-5 pills about 30 min prior to sex. Start with 1 and increase as needed.    Dispense:  50 tablet    Refill:  0  . metFORMIN (GLUCOPHAGE) 1000 MG tablet    Sig: Take 1 tablet (1,000 mg total) by mouth 2 (two) times daily with a meal.    Dispense:  180 tablet    Refill:  0      Follow up plan: Return if symptoms worsen or fail to improve.  Nobie Putnam, [facility] 04/11/2018, 10:31 AM

## 2018-04-11 NOTE — Patient Instructions (Addendum)
Thank you for coming to the office today.  Medications refilled.  May take Sildenafil to pharmacy as needed  Refilled Ambien for temporary supply  Please return urine sample tomorrow for microalbumin protein test.  Let us know if you get a flu shot.  Please schedule a Follow-up Appointment to: Return if symptoms worsen or fail to improve.  If you have any other questions or concerns, please feel free to call the office or send a message through [place]. You may also schedule an earlier appointment if necessary.  Additionally, you may be receiving a survey about your experience at our office within a few days to 1 week by e-mail or mail. We value your feedback.  Nobie Putnam, DO [facility]

## 2018-04-25 ENCOUNTER — Encounter: Payer: Self-pay | Admitting: Urology

## 2018-04-25 ENCOUNTER — Ambulatory Visit (INDEPENDENT_AMBULATORY_CARE_PROVIDER_SITE_OTHER): Payer: BLUE CROSS/BLUE SHIELD | Admitting: Urology

## 2018-04-25 ENCOUNTER — Other Ambulatory Visit: Payer: Self-pay

## 2018-04-25 VITALS — BP 127/85 | HR 94 | Ht 75.0 in | Wt 237.0 lb

## 2018-04-25 DIAGNOSIS — Z87448 Personal history of other diseases of urinary system: Secondary | ICD-10-CM | POA: Diagnosis not present

## 2018-04-25 DIAGNOSIS — Z8551 Personal history of malignant neoplasm of bladder: Secondary | ICD-10-CM

## 2018-04-25 LAB — URINALYSIS, COMPLETE
Bilirubin, UA: NEGATIVE
Glucose, UA: NEGATIVE
Ketones, UA: NEGATIVE
Leukocytes, UA: NEGATIVE
Nitrite, UA: NEGATIVE
Protein, UA: NEGATIVE
Specific Gravity, UA: 1.02 (ref 1.005–1.030)
Urobilinogen, Ur: 0.2 mg/dL (ref 0.2–1.0)
pH, UA: 5.5 (ref 5.0–7.5)

## 2018-04-25 LAB — MICROSCOPIC EXAMINATION
Epithelial Cells (non renal): NONE SEEN /hpf (ref 0–10)
WBC, UA: NONE SEEN /hpf (ref 0–5)

## 2018-04-25 NOTE — Progress Notes (Signed)
04/25/18  CC:  Chief Complaint  Patient presents with  . Procedure    Cystoscopy    HPI: 62 year old malewith recurrent bladder cancer who returns today for surveillance cystoscopy.    He initially presented with gross hematuria and was taken to the operating room for TURBT x2 on 11/2016 and 01/2017. Intraoperatively, clot was removed from the bladder. Upon inspection of his bladder, there are 2 discrete tumors in the bladder, one measuring 2.5 cm in the left lateral wall just superior to the bladder diverticulum highly suspicious for an invasive tumor. In addition to this, there was a velvety patch just below the diverticulum as well as a papillary lesion within the diverticulum itself.   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  He underwent induction BCG which was well-tolerated. 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  He returned at which time multiple areas of bladder biopsy on 06/2017. Showed evidence of persistent CIS.  He completed a second induction course of BCG x 6 completed on 09/12/17.   He returns to the OR on 10/24/17 for cystoscopy, bladder biopsy which was negative for malignancy.    BCG x 3 (maintenance, 1/3 dose) on 01/25/2018.    Most recent upper tract imaging in the form of bilateral retrogrades on 06/2017.  He returns today for routine cystoscopy.  Clinically, he is doing well and has no urinary complaints today.  He has been actively trying to lose weight and take care of himself.  Blood pressure (!) 149/94, pulse 91, height 6\' 3"  (1.905 m), weight 244 lb (110.7 kg). NED. A&Ox3.   No respiratory distress   Abd soft, NT, ND Normal phallus with bilateral descended testicles  Cystoscopy Procedure Note  Patient identification  was confirmed, informed consent was obtained, and patient was prepped using Betadine solution.  Lidocaine jelly was administered per urethral meatus.    Preoperative abx where received prior to procedure.     Pre-Procedure: - Inspection reveals a normal caliber ureteral meatus.  Procedure: The flexible cystoscope was introduced without difficulty - No urethral strictures/lesions are present. - Normal prostate  - Normal bladder neck - Bilateral ureteral orifices identified - Bladder mucosa  reveals no ulcers, tumors, or lesions.  There is a left lateral wall diverticulum only 16 [place], able to advance the scope and survey of the base of the area which shows normal mucosa.  There is a stellate scar just on the lateral edge of this but no evidence of papillary changes or any tumor.  No significant bladder erythema appreciated. - No bladder stones - Mild trabeculation  Retroflexion unremarkable.     Post-Procedure: - Patient tolerated the procedure well  Assessment/ Plan:  1. History of bladder cancer Personal history of high-grade T1 and CIS status post induction BCG x2 followed by maintainance x 1 Bladder is unremarkable today without concern for recurrence Urine cytology sent again today We will be aggressive in follow-up, q. 83-month cystoscopy In light of national BCG back order, we no longer have BCG available to patients for continuation of maintenance.  As soon as we are able to restore supply of this, he is an ideal candidate for continued maintenance.  Given his high risk tumor, will likely consider ordering repeat upper tract imaging in the form of CT urogram at next visit.   - Urinalysis, Complete  Return in about  3 months (around 07/25/2018) for cysto.   Hollice Espy, MD

## 2018-05-01 ENCOUNTER — Other Ambulatory Visit: Payer: Self-pay | Admitting: Urology

## 2018-06-27 ENCOUNTER — Telehealth: Payer: Self-pay | Admitting: Urology

## 2018-06-27 NOTE — Telephone Encounter (Signed)
Pt wants someone to call his [employer] (because he is out of network for Dr Erlene Quan) [facility] Provider Service # [phone]

## 2018-06-27 NOTE — Telephone Encounter (Signed)
There is nothing we can do about her being out of network, the patient can either come here or go to whatever urologist is in his network.

## 2018-07-04 ENCOUNTER — Ambulatory Visit (INDEPENDENT_AMBULATORY_CARE_PROVIDER_SITE_OTHER): Payer: BLUE CROSS/BLUE SHIELD | Admitting: Family Medicine

## 2018-07-04 ENCOUNTER — Encounter: Payer: Self-pay | Admitting: Family Medicine

## 2018-07-04 ENCOUNTER — Other Ambulatory Visit: Payer: Self-pay

## 2018-07-04 VITALS — BP 141/85 | HR 66 | Temp 98.1°F | Resp 16 | Ht 75.0 in | Wt 241.6 lb

## 2018-07-04 DIAGNOSIS — E66811 Obesity, class 1: Secondary | ICD-10-CM | POA: Insufficient documentation

## 2018-07-04 DIAGNOSIS — E663 Overweight: Secondary | ICD-10-CM | POA: Insufficient documentation

## 2018-07-04 DIAGNOSIS — E669 Obesity, unspecified: Secondary | ICD-10-CM | POA: Diagnosis not present

## 2018-07-04 DIAGNOSIS — E1142 Type 2 diabetes mellitus with diabetic polyneuropathy: Secondary | ICD-10-CM | POA: Diagnosis not present

## 2018-07-04 LAB — POCT UA - MICROALBUMIN: Microalbumin Ur, POC: 20 mg/L

## 2018-07-04 NOTE — Progress Notes (Signed)
Subjective:    [name], male    DOB: 20-Nov-1955, 63 y.o.   MRN: [id number]  Richard Oconnell is a 63 y.o. male presenting on 07/04/2018 for Diabetes and Obesity   HPI   CHRONIC DM, Type 2with neuropathy / Obesity BMI >30 Recent history has improved A1c with lifestyle, diet, exercise wt loss He is interested in medication for wt loss, asking about phentermine Last A1c 6.4 in 03/2018 CBGs:no CBG readings today Meds:Metformin 1000mg  BID Reports good compliance. Tolerating well w/o side-effects Previously on ACEi. Remains off now. Due for urine microalbumin Lifestyle - Weight gain 5 lbs in 2-3 months, home weight without clothes baseline 234 to 237 lbs - He feels like at plateau not losing weight despite changes to lifestyle - Tried intermittent fasting Diet improved low sugar / low carb - Now exercising jogging light run up to 30 min to 1 hr most days of week - History of chronic neuropathy L>R foot- today seems to be improved Denies hypoglycemia, polyuria, visual changes, numbness or tingling.   Depression screen [facility] 2/9 07/04/2018 04/11/2018 12/29/2017  Decreased Interest 0 0 0  Down, Depressed, Hopeless 0 0 0  PHQ - 2 Score 0 0 0    Social History   Tobacco Use  . Smoking status: Never Smoker  . Smokeless tobacco: Never Used  . Tobacco comment: once a while had one cigar >10 years ago  Substance Use Topics  . Alcohol use: No    Alcohol/week: 0.0 standard drinks  . Drug use: No    Review of Systems Per HPI unless specifically indicated above     Objective:    BP (!) 141/85   Pulse 66   Temp 98.1 F (36.7 C) (Oral)   Resp 16   Ht 6\' 3"  (1.905 m)   Wt 241 lb 9.6 oz (109.6 kg)   BMI 30.20 kg/m   Wt Readings from Last 3 Encounters:  07/04/18 241 lb 9.6 oz (109.6 kg)  04/25/18 237 lb (107.5 kg)  04/11/18 239 lb (108.4 kg)    Physical Exam Vitals signs and nursing note reviewed.  Constitutional:      General: He is not in  acute distress.    Appearance: He is well-developed. He is not diaphoretic.     Comments: Well-appearing, comfortable, cooperative, obese  HENT:     Head: Normocephalic and atraumatic.  Eyes:     General:        Right eye: No discharge.        Left eye: No discharge.     Conjunctiva/sclera: Conjunctivae normal.  Cardiovascular:     Rate and Rhythm: Normal rate.  Pulmonary:     Effort: Pulmonary effort is normal.  Skin:    General: Skin is warm and dry.     Findings: No erythema or rash.  Neurological:     Mental Status: He is alert and oriented to person, place, and time.  Psychiatric:        Behavior: Behavior normal.     Comments: Well groomed, good eye contact, normal speech and thoughts    Results for orders placed or performed in visit on 07/04/18  POCT UA - Microalbumin  Result Value Ref Range   Microalbumin Ur, POC 20 mg/L      Assessment & Plan:   Problem List Items Addressed This Visit    Obesity (BMI 30.0-34.9)    See A&P from DM with regards to weight management - Goal to  start new GLP1 medication      Type 2 diabetes mellitus with diabetic polyneuropathy, without long-term current use of insulin ([place]) - Primary    Previously well controlled DM A1c 6-7 range No hypoglycemia or hyperglycemia Complications - peripheral neuropathy, hyperlipidemia, obesity - increases risk of future cardiovascular complications   Plan:  1. Continue current therapy - Metformin 1000mg  BID - Discussed new treatment option today - GLP1 agent, advised him to check cost/coverage first, goal for stable A1c control, primary goal of weight loss and cardiovascular protection  - Will plan to make a sample available for 4-6 weeks and coupon discount card and prescribe when ready if effective - future decide if adjust metformin 2. Encourage improved lifestyle - low carb, low sugar diet, reduce portion size, continue improving regular exercise 3. Check CBG, bring log to next visit for  review 4. Urine microalbumin today 20, mild, off ACEi - check yearly 5. Follow-up 3 months DM A1c       Relevant Orders   POCT UA - Microalbumin (Completed)      No orders of the defined types were placed in this encounter.   Follow up plan: Return in about 3 months (around 10/02/2018) for DM A1c, med adjust.  Handout to schedule Annual DM Eye Exam in 2020.  Richard Oconnell, [facility] 07/04/2018, 11:31 AM

## 2018-07-04 NOTE — Assessment & Plan Note (Signed)
Previously well controlled DM A1c 6-7 range No hypoglycemia or hyperglycemia Complications - peripheral neuropathy, hyperlipidemia, obesity - increases risk of future cardiovascular complications   Plan:  1. Continue current therapy - Metformin 1000mg  BID - Discussed new treatment option today - GLP1 agent, advised him to check cost/coverage first, goal for stable A1c control, primary goal of weight loss and cardiovascular protection  - Will plan to make a sample available for 4-6 weeks and coupon discount card and prescribe when ready if effective - future decide if adjust metformin 2. Encourage improved lifestyle - low carb, low sugar diet, reduce portion size, continue improving regular exercise 3. Check CBG, bring log to next visit for review 4. Urine microalbumin today 20, mild, off ACEi - check yearly 5. Follow-up 3 months DM A1c

## 2018-07-04 NOTE — Assessment & Plan Note (Signed)
See A&P from DM with regards to weight management - Goal to start new GLP1 medication

## 2018-07-04 NOTE — Patient Instructions (Addendum)
Thank you for coming to the office today.  Call insurance find cost and coverage of the following  1. Ozempic (Semaglutide injection) - start 0.25mg  weekly for 4 weeks then increase to 0.5mg  weekly - This one has best benefit of weight loss and reducing Cardiovascular events  2. Trulicity (Dulaglutide) - once weekly - this is very good one, usually one of my top choices as well, two doses, 0.75 (likely we would start) and 1.5 max dose.  3. Bydureon BCise (Exenatide ER) - once weekly - very good medicine well tolerated, less side effects of nausea, upset stomach. No dose changes it is one dose only.  4. Victoza (Liraglutide) - once DAILY - 3 dose changes 0.6, 1.2 and 1.8, side effects nausea, upset stomach higher on this one but it is still very effective medicine  Your provider would like to you have your annual eye exam. Please contact your current eye doctor or here are some good options for you to contact.   [facility]   Address: [address] [place], [place] [address] Phone: [phone]  Website: [email or web address] [address] [place], [place] [address] Phone: [phone] [email or web address]  [email or web address]  Address: [place], [place], [place] [address] Phone: [phone]   [facility] [address] [place], [place] [address] Phone: [phone]  [facility] Address: [place], [place], [place] [address]  Phone: [phone]    Please schedule a Follow-up Appointment to: Return in about 3 months (around 10/02/2018) for DM A1c, med adjust.  If you have any other questions or concerns, please feel free to call the office or send a message through [place]. You may also schedule an earlier appointment if necessary.  Additionally, you may be receiving a survey about your experience at our office within a few days to 1 week by e-mail or mail. We value your feedback.  Nobie Putnam,  DO [place]

## 2018-07-05 ENCOUNTER — Telehealth: Payer: Self-pay | Admitting: Family Medicine

## 2018-07-05 NOTE — Telephone Encounter (Signed)
Left message for patient to call back  

## 2018-07-05 NOTE — Telephone Encounter (Signed)
Please call patient to notify him that he may come by office tomorrow Thurs 2/20 anytime to pick up a sample of the Ozempic injectable diabetes medication.  He should start with 0.25mg  injection once a week, same time every week for 4 weeks, and then he should increase it up to 0.5 mg weekly dose.  He can keep the co-pay discount card that comes with the sample until he is ready to have his own prescription.  He should call us in about 4 weeks after using medicine if he is ready to have [place] send him a new rx to his pharmacy if he wants to continue this medication.  Nobie Putnam, DO [facility] 07/05/2018, 4:21 PM

## 2018-07-06 NOTE — Telephone Encounter (Signed)
Patient advised, also has his sample.

## 2018-07-14 ENCOUNTER — Telehealth: Payer: Self-pay | Admitting: Family Medicine

## 2018-07-14 DIAGNOSIS — Z8551 Personal history of malignant neoplasm of bladder: Secondary | ICD-10-CM

## 2018-07-14 NOTE — Telephone Encounter (Signed)
Referral to [facility] for continuation of care for bladder cancer, s/p treatment completed by [facility] locally, now due to insurance, no longer in network, needs to be seen by [facility]. The new Urologist will have to request records from [facility] to get all of of their documentation. Also paper was faxed to [place] for a referral form that will need to be completed.  Dr Curley Spice [facility] [address] [place], [place] [address] Phone: [phone] Fax: [phone]

## 2018-07-14 NOTE — Telephone Encounter (Signed)
Pt called requesting a referral to  [facility]  Dr. Curley Spice :Address: [facility], [address], [place], [place] [address]  Phone: [phone]

## 2018-07-25 ENCOUNTER — Other Ambulatory Visit: Payer: BLUE CROSS/BLUE SHIELD | Admitting: Urology

## 2018-07-26 ENCOUNTER — Telehealth: Payer: Self-pay | Admitting: Family Medicine

## 2018-07-26 DIAGNOSIS — E1142 Type 2 diabetes mellitus with diabetic polyneuropathy: Secondary | ICD-10-CM

## 2018-07-26 MED ORDER — SEMAGLUTIDE(0.25 OR 0.5MG/DOS) 2 MG/1.5ML ~~LOC~~ SOPN: 0.2500 mg | PEN_INJECTOR | SUBCUTANEOUS | 2 refills | Status: DC

## 2018-07-26 NOTE — Telephone Encounter (Signed)
Reviewed chart.  He was given sample pen around 07/05/18 - it is good for 6 weeks.  If he is doing well, does not need new apt right now.  I have sent new rx Ozempic - to [facility]. He should activate the Copay Discount Card or turn this in to the pharmacy when he goes to pick it up.  He should have nearly completed the 4 weeks of 0.25mg  weekly dose, after 4th week is complete, then he should INCREASE dose up to 0.5mg  weekly and continue on that dose.  The new medicine will say start with 0.25mg , but he can ignore that and CONTINUE on 0.5mg  weekly.  Next follow-up should be scheduled for Diabetes A1c check in about 3 months from now.  Nobie Putnam, [facility] 07/26/2018, 2:21 PM

## 2018-07-26 NOTE — Telephone Encounter (Signed)
Pt said ozempic seems to be working well and asked if he needed to schedule a visit or is Dr. Raliegh Ip was just going to call a prescription to [name] [phone]

## 2018-07-27 NOTE — Telephone Encounter (Signed)
Left message for patient to call back  

## 2018-07-28 NOTE — Telephone Encounter (Signed)
Tried several attempts unable to reach patient a letter has been mailed.

## 2018-11-16 ENCOUNTER — Other Ambulatory Visit: Payer: Self-pay | Admitting: Family Medicine

## 2018-11-16 DIAGNOSIS — E1142 Type 2 diabetes mellitus with diabetic polyneuropathy: Secondary | ICD-10-CM

## 2018-11-16 MED ORDER — OZEMPIC (0.25 OR 0.5 MG/DOSE) 2 MG/1.5ML ~~LOC~~ SOPN: 0.2500 mg | PEN_INJECTOR | SUBCUTANEOUS | 2 refills | Status: DC

## 2018-11-16 NOTE — Telephone Encounter (Signed)
Pt  Called requesting refill on  ozempic  Called into  [place]

## 2018-12-06 ENCOUNTER — Ambulatory Visit (INDEPENDENT_AMBULATORY_CARE_PROVIDER_SITE_OTHER): Payer: BLUE CROSS/BLUE SHIELD | Admitting: Family Medicine

## 2018-12-06 ENCOUNTER — Other Ambulatory Visit: Payer: Self-pay

## 2018-12-06 ENCOUNTER — Encounter: Payer: Self-pay | Admitting: Family Medicine

## 2018-12-06 VITALS — BP 112/67 | HR 73 | Temp 98.5°F | Resp 16 | Ht 75.0 in | Wt 249.0 lb

## 2018-12-06 DIAGNOSIS — E1169 Type 2 diabetes mellitus with other specified complication: Secondary | ICD-10-CM | POA: Diagnosis not present

## 2018-12-06 DIAGNOSIS — N521 Erectile dysfunction due to diseases classified elsewhere: Secondary | ICD-10-CM

## 2018-12-06 DIAGNOSIS — E669 Obesity, unspecified: Secondary | ICD-10-CM

## 2018-12-06 DIAGNOSIS — E1142 Type 2 diabetes mellitus with diabetic polyneuropathy: Secondary | ICD-10-CM | POA: Diagnosis not present

## 2018-12-06 DIAGNOSIS — I10 Essential (primary) hypertension: Secondary | ICD-10-CM | POA: Diagnosis not present

## 2018-12-06 DIAGNOSIS — C679 Malignant neoplasm of bladder, unspecified: Secondary | ICD-10-CM | POA: Insufficient documentation

## 2018-12-06 LAB — POCT GLYCOSYLATED HEMOGLOBIN (HGB A1C): Hemoglobin A1C: 6.7 % — AB (ref 4.0–5.6)

## 2018-12-06 MED ORDER — OZEMPIC (1 MG/DOSE) 2 MG/1.5ML ~~LOC~~ SOPN: 1.0000 mg | PEN_INJECTOR | SUBCUTANEOUS | 3 refills | Status: DC

## 2018-12-06 MED ORDER — SILDENAFIL CITRATE 20 MG PO TABS: ORAL_TABLET | ORAL | 2 refills | Status: DC

## 2018-12-06 NOTE — Assessment & Plan Note (Signed)
Consistent with multifactorial ED with age, DM, HTN  Plan: 1. Refilled Sildenafil 20mg  tabs, taking 3-4 tabs PRN, given additional refills, printed take to [place], goodrx coupon 2. Follow-up as needed

## 2018-12-06 NOTE — Assessment & Plan Note (Signed)
Normal BP, controlled Off ACEi No known complications    Plan:  1. Remain off medication 2. Encourage improved lifestyle - low sodium diet, regular exercise 3. Start monitor BP outside office, bring readings to next visit, if persistently >140/90 or new symptoms notify office sooner

## 2018-12-06 NOTE — Assessment & Plan Note (Signed)
Controlled DM2 A1c up to 6.7, previous 6.4, on ozempic No hypoglycemia or hyperglycemia Complications - peripheral neuropathy, hyperlipidemia, obesity - increases risk of future cardiovascular complications  OFF Metformin, patient preference.  Plan:  1. INCREASE Ozempic from 0.5 up to 1mg  weekly inj now, goal for better wt loss reduce appetite and control sugar 2. Encourage improved lifestyle - low carb, low sugar diet, reduce portion size, continue improving regular exercise 3. Check CBG, bring log to next visit for review 4. Follow-up 3 months DM A1c

## 2018-12-06 NOTE — Progress Notes (Signed)
Subjective:    [name], male    DOB: May 21, 1955, 63 y.o.   MRN: [id number]  Richard Oconnell is a 63 y.o. male presenting on 12/06/2018 for Diabetes (highest blood sugar 130 mg/dl and lowest 92 mg/dl)   HPI  CHRONIC DM, Type 2with neuropathy / Obesity BMI >31 - Last visit with me 06/2018, for same problem, treated with onset GLP1 ozempic, see prior notes for background information. - Interval update with doing well on Ozempic, has improved lfiestyle - Today patient reports he is concern with weight plateau Meds: Ozempic 0.5mg  weekly injection - OFF Metformin by his choice - Some appetite reduced but not significant still - He says if he eats any foods with corn in it, it can cause issues with blood sugar/ - Eating mostly salads - Improved cardio now 150 min a week - Difficulty reducing weight 238-246 lb plateau, difficulty going below -Due DM eye - Dr Sandra Cockayne, he will call to schedule - Not on ACEi anymore, urine micro done in 06/2018 - Mild reduced nerve sensation in feet, occasional burning episodic only, some chronic neuropathy. Denies any hypoglycemia, polyuria, visual changes, persistent numbness tingling  CHRONIC HTN: Reports no new concerns, now improved BP with lifestyle Current Meds - None   Denies CP, dyspnea, HA, edema, dizziness / lightheadedness  Follow-up Bladder Cancer Reviewed prior records Changed Urologist to Dr Thurmond Butts at [facility] now due to insurance Last see 09/2018, had prior bladder biopsy negative in 11/2017, and BCG and Cysto in 2019, missed cysto in 07/2018 due to insurance could not return. - Proceeded with following on cysto and maintenance BCG treatments   Depression screen [facility] 2/9 12/06/2018 07/04/2018 04/11/2018  Decreased Interest 0 0 0  Down, Depressed, Hopeless 0 0 0  PHQ - 2 Score 0 0 0    Social History   Tobacco Use  . Smoking status: Never Smoker  . Smokeless tobacco: Never Used  . Tobacco comment: once a while  had one cigar >10 years ago  Substance Use Topics  . Alcohol use: No    Alcohol/week: 0.0 standard drinks  . Drug use: No    Review of Systems Per HPI unless specifically indicated above     Objective:    BP 112/67   Pulse 73   Temp 98.5 F (36.9 C) (Oral)   Resp 16   Ht 6\' 3"  (1.905 m)   Wt 249 lb (112.9 kg)   BMI 31.12 kg/m   Wt Readings from Last 3 Encounters:  12/06/18 249 lb (112.9 kg)  07/04/18 241 lb 9.6 oz (109.6 kg)  04/25/18 237 lb (107.5 kg)    Physical Exam Vitals signs and nursing note reviewed.  Constitutional:      General: He is not in acute distress.    Appearance: He is well-developed. He is not diaphoretic.     Comments: Well-appearing, comfortable, cooperative  HENT:     Head: Normocephalic and atraumatic.  Eyes:     General:        Right eye: No discharge.        Left eye: No discharge.     Conjunctiva/sclera: Conjunctivae normal.  Cardiovascular:     Rate and Rhythm: Normal rate.  Pulmonary:     Effort: Pulmonary effort is normal.  Skin:    General: Skin is warm and dry.     Findings: No erythema or rash.  Neurological:     Mental Status: He is alert and oriented to  person, place, and time.  Psychiatric:        Behavior: Behavior normal.     Comments: Well groomed, good eye contact, normal speech and thoughts      Recent Labs    04/11/18 1347 12/06/18 1026  HGBA1C 6.4* 6.7*   Diabetic Foot Exam - Simple   Simple Foot Form Diabetic Foot exam was performed with the following findings: Yes 12/06/2018 10:45 AM  Visual Inspection See comments: Yes Sensation Testing See comments: Yes Pulse Check Posterior Tibialis and Dorsalis pulse intact bilaterally: Yes Comments Mild reduced sensation to monofilament bilateral great toes tip only, and mild R lateral foot forefoot plantar. Mild callus formation multiple areas heel and great toe.     Results for orders placed or performed in visit on 12/06/18  POCT HgB A1C  Result Value Ref  Range   Hemoglobin A1C 6.7 (A) 4.0 - 5.6 %      Assessment & Plan:   Problem List Items Addressed This Visit    Erectile dysfunction associated with type 2 diabetes mellitus ([place])    Consistent with multifactorial ED with age, DM, HTN  Plan: 1. Refilled Sildenafil 20mg  tabs, taking 3-4 tabs PRN, given additional refills, printed take to [place], goodrx coupon 2. Follow-up as needed      Relevant Medications   OZEMPIC, 1 MG/DOSE, 2 MG/1.5ML SOPN   sildenafil (REVATIO) 20 MG tablet   Hypertension    Normal BP, controlled Off ACEi No known complications    Plan:  1. Remain off medication 2. Encourage improved lifestyle - low sodium diet, regular exercise 3. Start monitor BP outside office, bring readings to next visit, if persistently >140/90 or new symptoms notify office sooner      Relevant Medications   sildenafil (REVATIO) 20 MG tablet   Malignant neoplasm of urinary bladder ([place])    Followed by Dr Thurmond Butts [facility] On BCG Maintenance and Cystoscopy surveillance      Obesity (BMI 30.0-34.9)    Improving lifestyle Still persistent abnormal weight, at plateau Increase GLP1 ozempic 0.5 up to 1mg  now Discussed wt loss medications - advised that I do not rx phentermine. Explained risk and side effect, and that we can consider other option such as contrave as an option in future he can check cost coverage, otherwise my main recommendation is keep improving lifestyle and increase GLP1 ozempic      Type 2 diabetes mellitus with diabetic polyneuropathy, without long-term current use of insulin (HCC) - Primary    Controlled DM2 A1c up to 6.7, previous 6.4, on ozempic No hypoglycemia or hyperglycemia Complications - peripheral neuropathy, hyperlipidemia, obesity - increases risk of future cardiovascular complications  OFF Metformin, patient preference.  Plan:  1. INCREASE Ozempic from 0.5 up to 1mg  weekly inj now, goal for better wt loss reduce appetite and  control sugar 2. Encourage improved lifestyle - low carb, low sugar diet, reduce portion size, continue improving regular exercise 3. Check CBG, bring log to next visit for review 4. Follow-up 3 months DM A1c       Relevant Medications   OZEMPIC, 1 MG/DOSE, 2 MG/1.5ML SOPN   Other Relevant Orders   POCT HgB A1C (Completed)      Meds ordered this encounter  Medications  . OZEMPIC, 1 MG/DOSE, 2 MG/1.5ML SOPN    Sig: Inject 1 mg into the skin once a week.    Dispense:  2 pen    Refill:  3    Dose increase from 0.5  up to 1mg   . sildenafil (REVATIO) 20 MG tablet    Sig: Take 1-5 pills about 30 min prior to sex    Dispense:  50 tablet    Refill:  2    Follow up plan: Return in about 3 months (around 03/08/2019) for DM A1c, weight check, BP check.  Nobie Putnam, DO [facility] 12/06/2018, 10:18 AM

## 2018-12-06 NOTE — Patient Instructions (Addendum)
Thank you for coming to the office today.  Check with insurance on Contrave (Buproprion / Naltrexone combination) for weight loss appetite suppression  Increase Ozempic from 0.5 up to 1mg  dose, once weekly. It can help further reduce appetite and help weight loss and control sugar.  1 pen will last 2 doses or 2 weeks, you will get 2 pens for 1 month. I sent 1 month supply with refills.  Recent Labs    04/11/18 1347 12/06/18 1026  HGBA1C 6.4* 6.7*   Please call to schedule Diabetic Eye Exam with Dr Sandra Cockayne, have him send [place] a copy of the result.   Please schedule a Follow-up Appointment to: Return in about 3 months (around 03/08/2019) for DM A1c, weight check, BP check.  If you have any other questions or concerns, please feel free to call the office or send a message through [place]. You may also schedule an earlier appointment if necessary.  Additionally, you may be receiving a survey about your experience at our office within a few days to 1 week by e-mail or mail. We value your feedback.  Nobie Putnam, DO [place]

## 2018-12-06 NOTE — Assessment & Plan Note (Signed)
Followed by Dr Thurmond Butts [facility] On BCG Maintenance and Cystoscopy surveillance

## 2018-12-06 NOTE — Assessment & Plan Note (Signed)
Improving lifestyle Still persistent abnormal weight, at plateau Increase GLP1 ozempic 0.5 up to 1mg  now Discussed wt loss medications - advised that I do not rx phentermine. Explained risk and side effect, and that we can consider other option such as contrave as an option in future he can check cost coverage, otherwise my main recommendation is keep improving lifestyle and increase GLP1 ozempic

## 2019-03-08 ENCOUNTER — Other Ambulatory Visit: Payer: Self-pay | Admitting: Family Medicine

## 2019-03-08 DIAGNOSIS — E1142 Type 2 diabetes mellitus with diabetic polyneuropathy: Secondary | ICD-10-CM

## 2019-03-09 ENCOUNTER — Other Ambulatory Visit: Payer: Self-pay

## 2019-03-09 ENCOUNTER — Encounter: Payer: Self-pay | Admitting: Family Medicine

## 2019-03-09 ENCOUNTER — Other Ambulatory Visit: Payer: Self-pay | Admitting: Family Medicine

## 2019-03-09 ENCOUNTER — Ambulatory Visit (INDEPENDENT_AMBULATORY_CARE_PROVIDER_SITE_OTHER): Payer: BLUE CROSS/BLUE SHIELD | Admitting: Family Medicine

## 2019-03-09 VITALS — BP 126/77 | HR 79 | Temp 98.4°F | Resp 16 | Ht 75.0 in | Wt 238.0 lb

## 2019-03-09 DIAGNOSIS — B3742 Candidal balanitis: Secondary | ICD-10-CM | POA: Diagnosis not present

## 2019-03-09 DIAGNOSIS — I10 Essential (primary) hypertension: Secondary | ICD-10-CM

## 2019-03-09 DIAGNOSIS — E1142 Type 2 diabetes mellitus with diabetic polyneuropathy: Secondary | ICD-10-CM

## 2019-03-09 DIAGNOSIS — E663 Overweight: Secondary | ICD-10-CM

## 2019-03-09 DIAGNOSIS — C679 Malignant neoplasm of bladder, unspecified: Secondary | ICD-10-CM

## 2019-03-09 DIAGNOSIS — Z1211 Encounter for screening for malignant neoplasm of colon: Secondary | ICD-10-CM

## 2019-03-09 DIAGNOSIS — Z125 Encounter for screening for malignant neoplasm of prostate: Secondary | ICD-10-CM

## 2019-03-09 DIAGNOSIS — Z0000 Encounter for general adult medical examination without abnormal findings: Secondary | ICD-10-CM

## 2019-03-09 DIAGNOSIS — E1169 Type 2 diabetes mellitus with other specified complication: Secondary | ICD-10-CM

## 2019-03-09 LAB — POCT GLYCOSYLATED HEMOGLOBIN (HGB A1C): Hemoglobin A1C: 5.6 % (ref 4.0–5.6)

## 2019-03-09 MED ORDER — OZEMPIC (1 MG/DOSE) 2 MG/1.5ML ~~LOC~~ SOPN: 1.0000 mg | PEN_INJECTOR | SUBCUTANEOUS | 5 refills | Status: DC

## 2019-03-09 MED ORDER — FLUCONAZOLE 150 MG PO TABS: ORAL_TABLET | ORAL | 0 refills | Status: DC

## 2019-03-09 NOTE — Patient Instructions (Addendum)
Thank you for coming to the office today.  Recent Labs    04/11/18 1347 12/06/18 1026 03/09/19 1122  HGBA1C 6.4* 6.7* 5.6   Please call to schedule - Please contact your current eye doctor or here are some good options for you to contact.  Dr Thomasene Ripple  [facility] [address] [place], [place] [address] Phone: [phone] [email or web address]  ----------------------------  Start Diflucan 121m one pill every other day for about 1 week to finish med.  Return 2 weeks after BCG treatment to get your Flu Shot.  Colon Cancer Screening: - For all adults age 63+routine colon cancer screening is highly recommended.     - Recent guidelines from AOsorecommend starting age of 43- Early detection of colon cancer is important, because often there are no warning signs or symptoms, also if found early usually it can be cured. Late stage is hard to treat.  - If you are not interested in Colonoscopy screening (if done and normal you could be cleared for 5 to 10 years until next due), then Cologuard is an excellent alternative for screening test for Colon Cancer. It is highly sensitive for detecting DNA of colon cancer from even the earliest stages. Also, there is NO bowel prep required. - If Cologuard is NEGATIVE, then it is good for 3 years before next due - If Cologuard is POSITIVE, then it is strongly advised to get a Colonoscopy, which allows the GI doctor to locate the source of the cancer or polyp (even very early stage) and treat it by removing it. ------------------------- If you would like to proceed with Cologuard (stool DNA test) - FIRST, call your insurance company and tell them you want to check cost of Cologuard tell them CPT Code [phone](it may be completely covered and you could get for no cost, OR max cost without any coverage is about $600). Also, keep in mind if you do NOT open the kit, and decide not to do the test, you will NOT be charged,  you should contact the company if you decide not to do the test. - If you want to proceed, you can notify [place](phone message, [place] or at next visit) and we will order it for you. The test kit will be delivered to you house within about 1 week. Follow instructions to collect sample, you may call the company for any help or questions, 24/7 telephone support at [phone]   Please schedule a Follow-up Appointment to: Return in about 5 months (around 08/07/2019) for Annual Physical.  If you have any other questions or concerns, please feel free to call the office or send a message through [place] You may also schedule an earlier appointment if necessary.  Additionally, you may be receiving a survey about your experience at our office within a few days to 1 week by e-mail or mail. We value your feedback.  ANobie Putnam DO [place]

## 2019-03-09 NOTE — Assessment & Plan Note (Signed)
Normal BP, controlled Off ACEi No known complications    Plan:  1. Remain off medication 2. Encourage improved lifestyle - low sodium diet, regular exercise 3. Continue monitor BP outside office, bring readings to next visit, if persistently >140/90 or new symptoms notify office sooner

## 2019-03-09 NOTE — Assessment & Plan Note (Addendum)
Very well controlled DM2 A1c down to 5.6 now w/ inc dose GLP1 ozempic, wt loss No hypoglycemia or hyperglycemia Complications - peripheral neuropathy, hyperlipidemia, obesity - increases risk of future cardiovascular complications  OFF Metformin, patient preference.  Plan:  1. CONTINUE Ozempic 1mg  weekly inj - new re order - significantly improved on this med - he does ask to future taper down or off prefer not to take med long term, anticipate may continue 6-12 months then discuss 2. Encourage improved lifestyle - low carb, low sugar diet, reduce portion size, continue improving regular exercise 3. Check CBG, bring log to next visit for review  Needs DM Eye from [facility] - needs to schedule

## 2019-03-09 NOTE — Assessment & Plan Note (Signed)
Significantly improved wt loss, lifestyle On GLP1 ozempic high dose now Reduced appetite, doing well with diet

## 2019-03-09 NOTE — Progress Notes (Signed)
Subjective:    [name], male    DOB: Jan 30, 1956, 63 y.o.   MRN: [id number]  Richard Oconnell is a 63 y.o. male presenting on 03/09/2019 for Diabetes   HPI   CHRONIC DM, Type 2with neuropathy/Overweight BMI >29 - Last visit with me 11/2018, for same problem, treated with onset GLP1 ozempic increase from 0.5 up to 1mg , see prior notes for background information. - Interval update with doing well on Ozempic higher dose, has improved lifestyle, reduced appetite, had weight loss Meds: Ozempic 1mg  weekly injection - OFF Metformin by his choice - Significant improved diet - Weight down 11 lbs in 3 months, to 238 lbs now -Due DM eye - Dr Richard Oconnell, he will call to schedule - Not on ACEi anymore, urine micro done in 06/2018 - Mild reduced nerve sensation in feet, occasional burning episodic only, some chronic neuropathy. Denies any hypoglycemia, polyuria, visual changes, persistent numbness tingling  CHRONIC HTN: Reports no new concerns, now improved BP with lifestyle Current Meds - None   Denies CP, dyspnea, HA, edema, dizziness / lightheadedness  Follow-up Bladder Cancer Reviewed prior records Followed by Dr Richard Oconnell - prior cysto and upcoming BCG maintenance treatment.  Yeast infection / Balanitis Reports new onset red irritation rash on penis with white debris discharge similar to prior yeast infection, resolved w/ diflucan.   Health Maintenance:  Colon CA Screening: Never had colonoscopy. Currently asymptomatic. No known family history of colon CA. Due for screening test considering Cologuard, counseling given  Due for Flu Shot, declines today despite counseling on benefits - do to upcoming BCG treatment from urology, he will get flu shot 2-4 weeks after that treatment.   Depression screen [facility] 2/9 03/09/2019 12/06/2018 07/04/2018  Decreased Interest 0 0 0  Down, Depressed, Hopeless 0 0 0  PHQ - 2 Score 0 0 0    Social History   Tobacco  Use  . Smoking status: Never Smoker  . Smokeless tobacco: Never Used  . Tobacco comment: once a while had one cigar >10 years ago  Substance Use Topics  . Alcohol use: No    Alcohol/week: 0.0 standard drinks  . Drug use: No    Review of Systems Per HPI unless specifically indicated above     Objective:    BP 126/77   Pulse 79   Temp 98.4 F (36.9 C) (Oral)   Resp 16   Ht 6\' 3"  (1.905 m)   Wt 238 lb (108 kg)   BMI 29.75 kg/m   Wt Readings from Last 3 Encounters:  03/09/19 238 lb (108 kg)  12/06/18 249 lb (112.9 kg)  07/04/18 241 lb 9.6 oz (109.6 kg)    Physical Exam Vitals signs and nursing note reviewed.  Constitutional:      General: He is not in acute distress.    Appearance: He is well-developed. He is not diaphoretic.     Comments: Well-appearing, comfortable, cooperative  HENT:     Head: Normocephalic and atraumatic.  Eyes:     General:        Right eye: No discharge.        Left eye: No discharge.     Conjunctiva/sclera: Conjunctivae normal.  Cardiovascular:     Rate and Rhythm: Normal rate.  Pulmonary:     Effort: Pulmonary effort is normal.  Skin:    General: Skin is warm and dry.     Findings: No erythema or rash.  Neurological:  Mental Status: He is alert and oriented to person, place, and time.  Psychiatric:        Behavior: Behavior normal.     Comments: Well groomed, good eye contact, normal speech and thoughts       Recent Labs    04/11/18 1347 12/06/18 1026 03/09/19 1122  HGBA1C 6.4* 6.7* 5.6    Results for orders placed or performed in visit on 03/09/19  POCT HgB A1C  Result Value Ref Range   Hemoglobin A1C 5.6 4.0 - 5.6 %      Assessment & Plan:   Problem List Items Addressed This Visit    Type 2 diabetes mellitus with diabetic polyneuropathy, without long-term current use of insulin ([place]) - Primary    Very well controlled DM2 A1c down to 5.6 now w/ inc dose GLP1 ozempic, wt loss No hypoglycemia or hyperglycemia  Complications - peripheral neuropathy, hyperlipidemia, obesity - increases risk of future cardiovascular complications  OFF Metformin, patient preference.  Plan:  1. CONTINUE Ozempic 1mg  weekly inj - new re order - significantly improved on this med - he does ask to future taper down or off prefer not to take med long term, anticipate may continue 6-12 months then discuss 2. Encourage improved lifestyle - low carb, low sugar diet, reduce portion size, continue improving regular exercise 3. Check CBG, bring log to next visit for review  Needs DM Eye from [facility] - needs to schedule      Relevant Medications   OZEMPIC, 1 MG/DOSE, 2 MG/1.5ML SOPN   Other Relevant Orders   POCT HgB A1C (Completed)   Overweight (BMI 25.0-29.9)    Significantly improved wt loss, lifestyle On GLP1 ozempic high dose now Reduced appetite, doing well with diet      Hypertension    Normal BP, controlled Off ACEi No known complications    Plan:  1. Remain off medication 2. Encourage improved lifestyle - low sodium diet, regular exercise 3. Continue monitor BP outside office, bring readings to next visit, if persistently >140/90 or new symptoms notify office sooner       Other Visit Diagnoses    Candidal balanitis       Relevant Medications   fluconazole (DIFLUCAN) 150 MG tablet   Screening for colon cancer       Relevant Orders   Cologuard      Trial on fluconazole due to balanitis/yeast, f/u if not resolved  Due for routine colon cancer screening. Never had colonoscopy (not interested), no family history colon cancer. - Discussion today about recommendations for either Colonoscopy or Cologuard screening, benefits and risks of screening, interested in Cologuard, understands that if positive then recommendation is for diagnostic colonoscopy to follow-up. - Ordered Cologuard today   Meds ordered this encounter  Medications  . fluconazole (DIFLUCAN) 150 MG tablet    Sig: Take one tablet by  mouth every other day for 1 week    Dispense:  3 tablet    Refill:  0  . OZEMPIC, 1 MG/DOSE, 2 MG/1.5ML SOPN    Sig: Inject 1 mg into the skin once a week.    Dispense:  3 mL    Refill:  5    Follow up plan: Return in about 5 months (around 08/07/2019) for Annual Physical.  Future labs ordered for 07/2019  Nobie Putnam, [facility] 03/09/2019, 11:18 AM

## 2019-06-06 ENCOUNTER — Telehealth: Payer: Self-pay | Admitting: Family Medicine

## 2019-06-06 DIAGNOSIS — E1142 Type 2 diabetes mellitus with diabetic polyneuropathy: Secondary | ICD-10-CM

## 2019-06-06 MED ORDER — TRULICITY 1.5 MG/0.5ML ~~LOC~~ SOAJ: 1.5000 mg | SUBCUTANEOUS | 2 refills | Status: DC

## 2019-06-06 NOTE — Telephone Encounter (Signed)
Notified patient.

## 2019-06-06 NOTE — Telephone Encounter (Signed)
Patient came by office with papers from [facility]. Says Ozempic is not covered, cost >$1000. He was on 1mg  weekly injection. He will need prior authorization or switch med.  List shows Trulicity is covered.  Will discontinue Ozempic 1mg  and switch to Trulicity 1.5mg  weekly injection.  New rx sent to [facility].  If needed he can notify us to pick up a box of samples x 2 pens if need due to cost.  He should follow up with new provider if he is changing due to insurance cost for future refills.  Nobie Putnam, [facility] 06/06/2019, 11:44 AM

## 2019-06-14 ENCOUNTER — Telehealth: Payer: Self-pay | Admitting: Family Medicine

## 2019-06-14 NOTE — Telephone Encounter (Signed)
Approval # for Ozempic [id number] effective dates 06/08/19-06/06/20.

## 2019-07-06 DIAGNOSIS — E6609 Other obesity due to excess calories: Secondary | ICD-10-CM | POA: Insufficient documentation

## 2019-12-27 LAB — HEMOGLOBIN A1C: Hemoglobin A1C: 6.9

## 2020-02-08 IMAGING — DX DG HIP (WITH OR WITHOUT PELVIS) 2-3V*L*
4 series · 4 of 4 positions shown · non-contrast
Comparison: [HOSPITAL] CT Abdomen and Pelvis
11/23/2016.

CLINICAL DATA: 62-year-old male with left hip pain after fall 3
days ago from standing.

EXAM:
DG HIP (WITH OR WITHOUT PELVIS) 2-3V LEFT

[pelvis ap (1 of 2)]
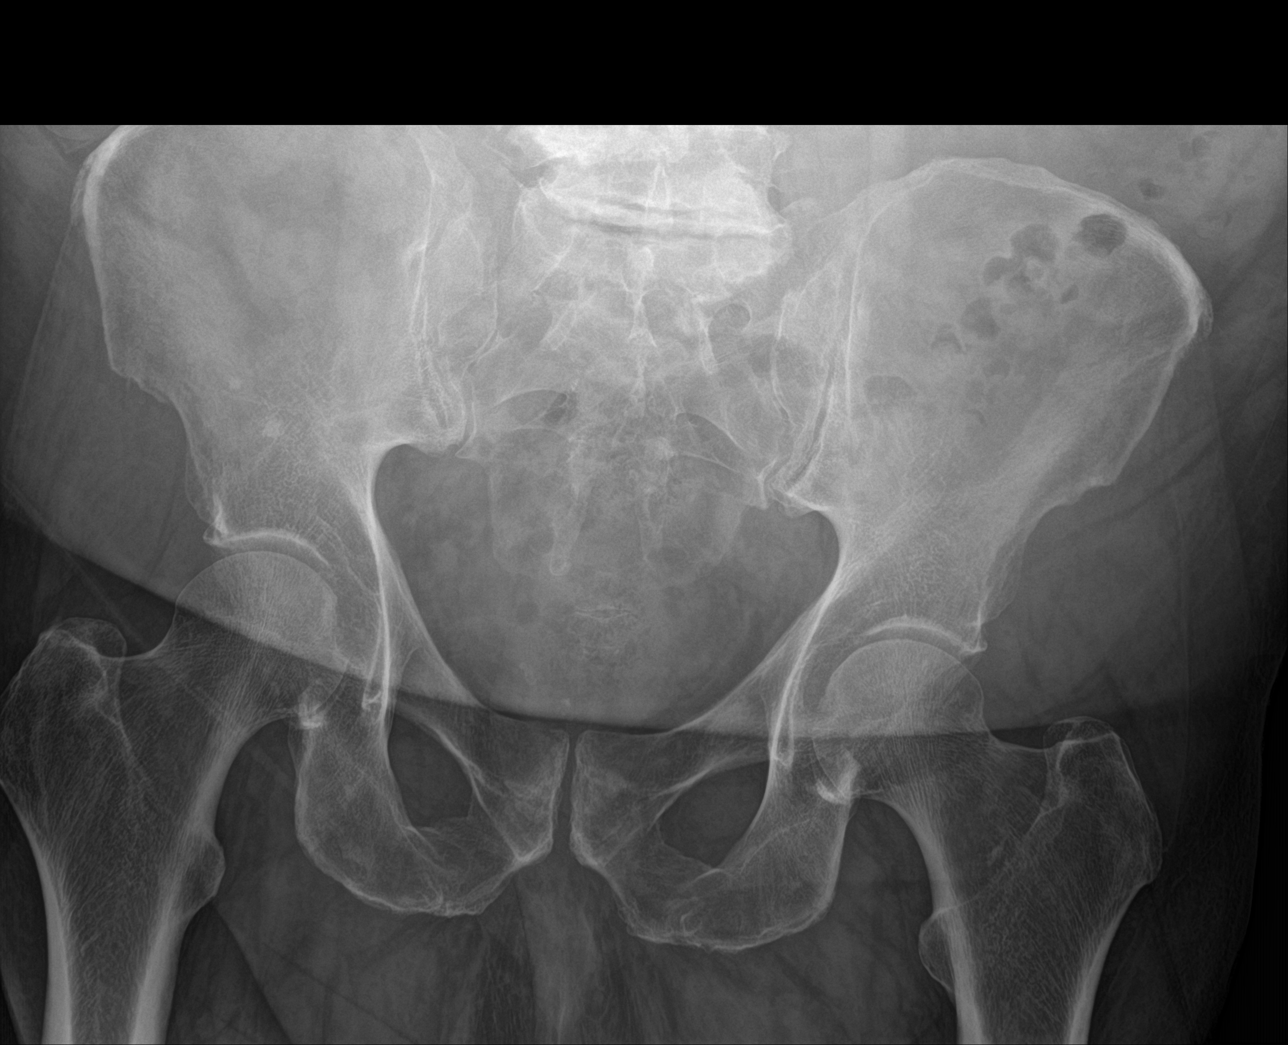

[hip ap]
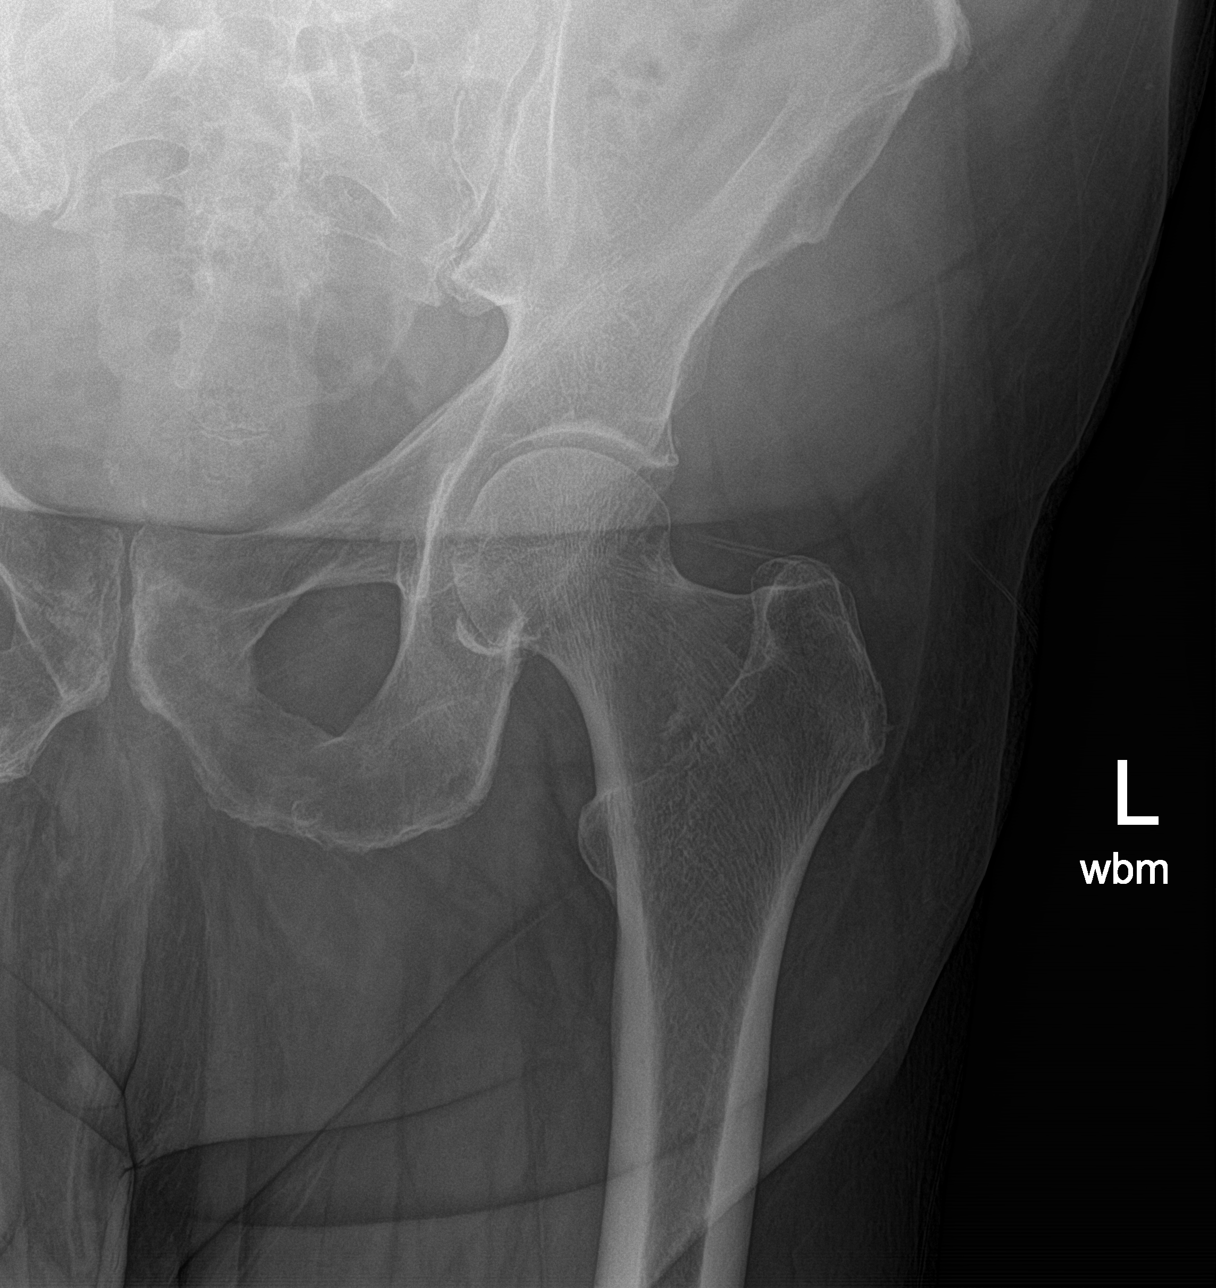

[hip lat]
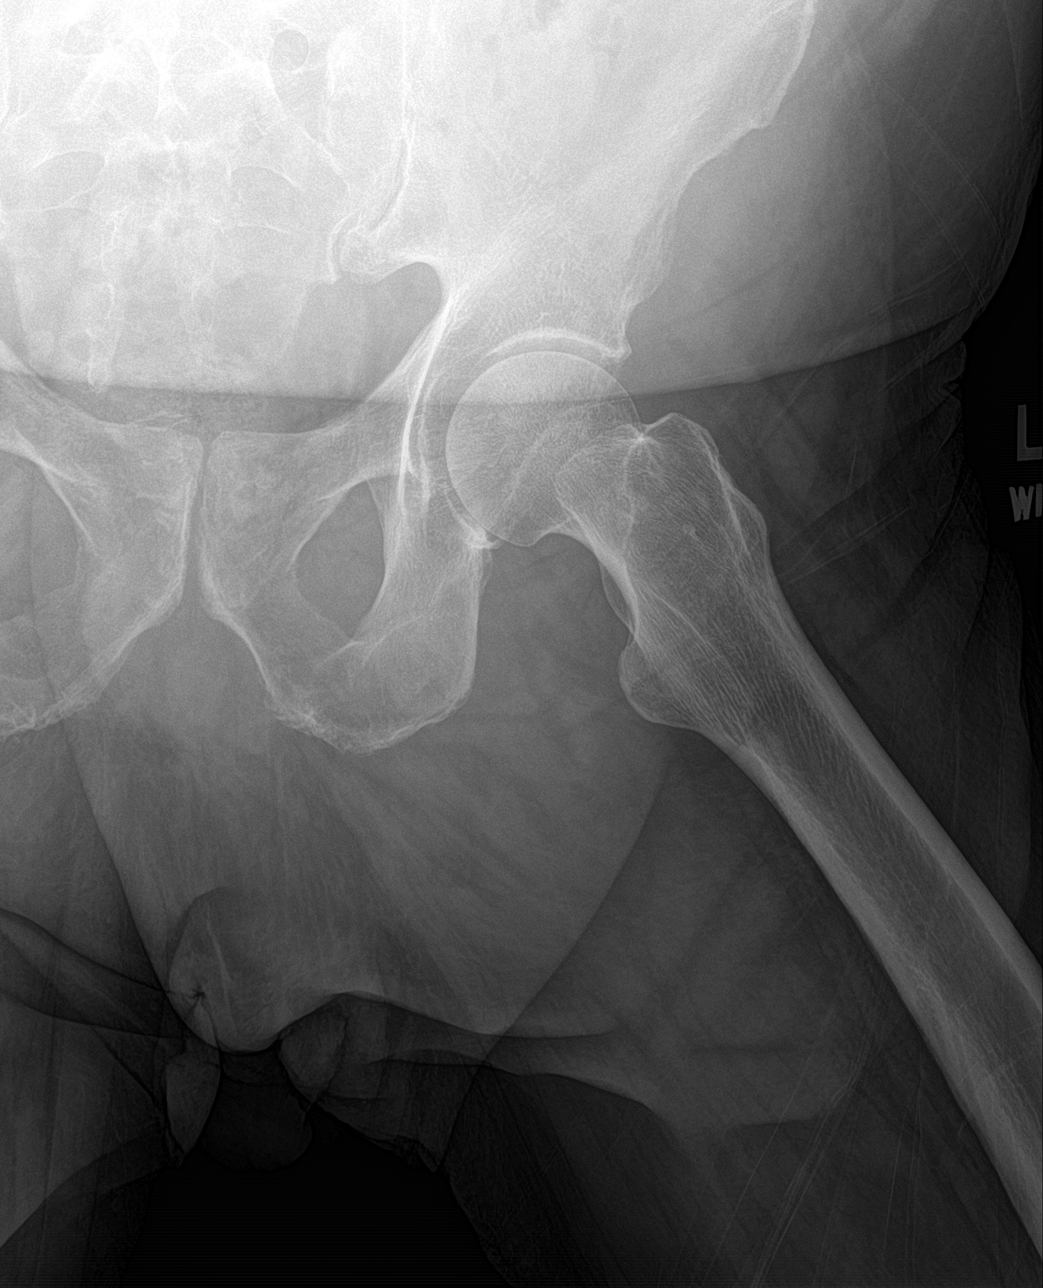

[pelvis ap (2 of 2)]
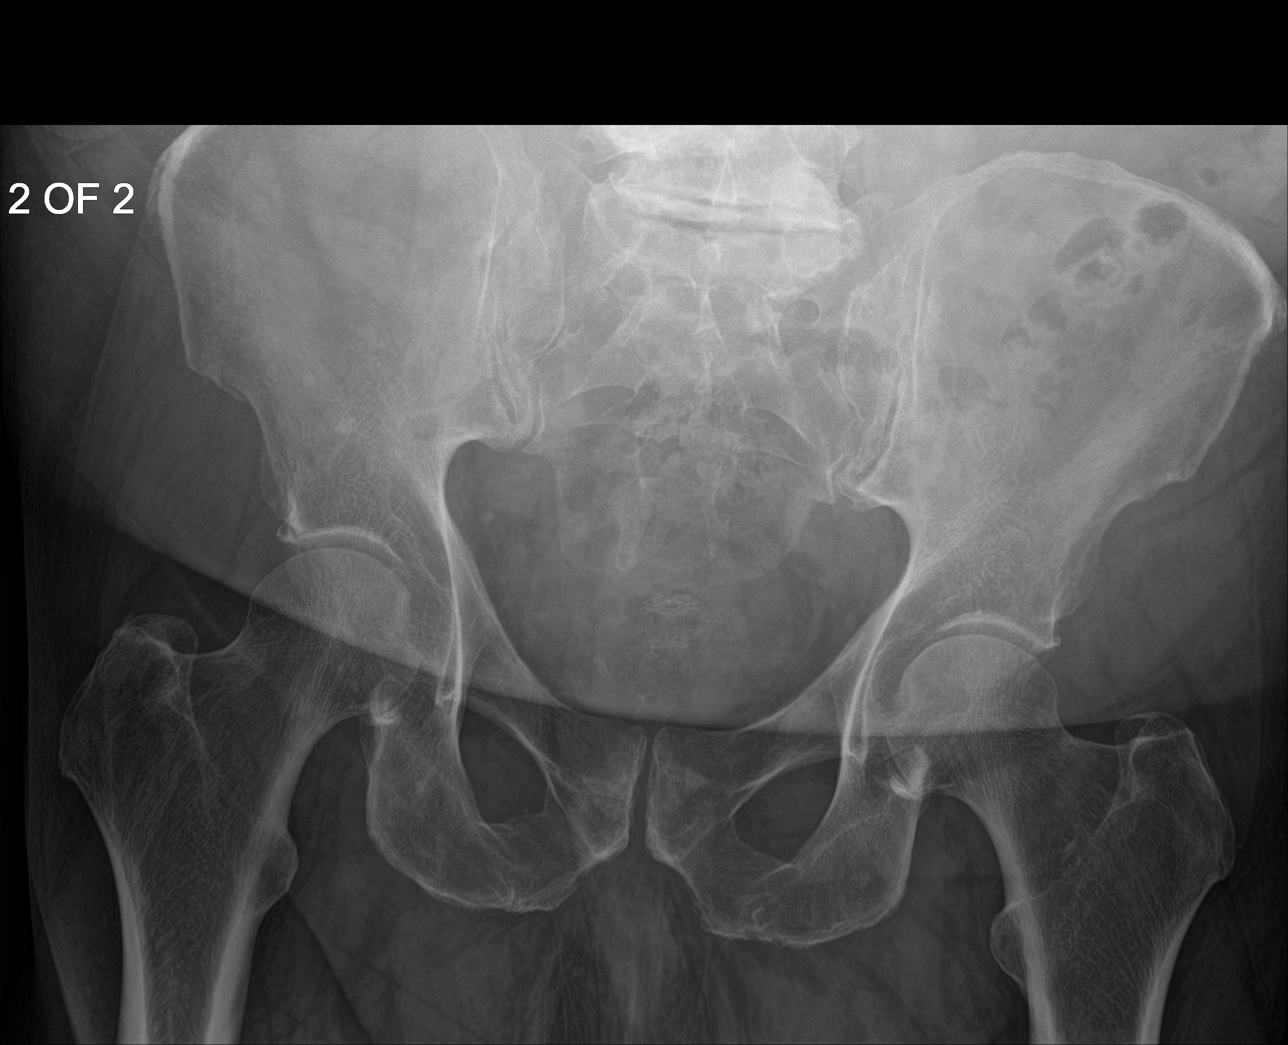

[4 of 4 positions shown; findings below may reference images not displayed]

FINDINGS: Femoral heads remain normally located. The hip joint spaces appear
stable, symmetric, and normal for age. The pelvis appears intact.
Grossly intact proximal right femur. The proximal left femur appears
intact. The sacral ala and SI joints appear stable and within normal
limits. Advanced lumbar disc and endplate degeneration with
multilevel vacuum disc partially redemonstrated.
IMPRESSION: 1. Negative for age radiographic appearance of the left hip and
pelvis. If occult hip fracture is suspected or if the patient is
unable to weightbear, MRI is the preferred modality for further
evaluation.
2. Advanced lumbar spine degeneration.

## 2020-03-12 ENCOUNTER — Other Ambulatory Visit: Payer: Self-pay | Admitting: Family Medicine

## 2020-03-12 DIAGNOSIS — E1142 Type 2 diabetes mellitus with diabetic polyneuropathy: Secondary | ICD-10-CM

## 2020-03-12 NOTE — Telephone Encounter (Signed)
Call to patient he has had to see another provider due to insurance- patient is planning to come back to the office at the beginning of the year.Patient will have the pharmacy forward the prescription

## 2020-05-30 ENCOUNTER — Encounter: Payer: Self-pay | Admitting: Family Medicine

## 2020-05-30 ENCOUNTER — Other Ambulatory Visit: Payer: Self-pay | Admitting: Family Medicine

## 2020-05-30 ENCOUNTER — Ambulatory Visit (INDEPENDENT_AMBULATORY_CARE_PROVIDER_SITE_OTHER): Payer: Medicare Other | Admitting: Family Medicine

## 2020-05-30 ENCOUNTER — Other Ambulatory Visit: Payer: Self-pay

## 2020-05-30 VITALS — BP 136/84 | HR 77 | Ht 76.0 in | Wt 270.2 lb

## 2020-05-30 DIAGNOSIS — E1142 Type 2 diabetes mellitus with diabetic polyneuropathy: Secondary | ICD-10-CM

## 2020-05-30 DIAGNOSIS — I10 Essential (primary) hypertension: Secondary | ICD-10-CM

## 2020-05-30 DIAGNOSIS — Z23 Encounter for immunization: Secondary | ICD-10-CM

## 2020-05-30 DIAGNOSIS — E785 Hyperlipidemia, unspecified: Secondary | ICD-10-CM

## 2020-05-30 DIAGNOSIS — E1169 Type 2 diabetes mellitus with other specified complication: Secondary | ICD-10-CM | POA: Diagnosis not present

## 2020-05-30 DIAGNOSIS — Z1211 Encounter for screening for malignant neoplasm of colon: Secondary | ICD-10-CM

## 2020-05-30 DIAGNOSIS — C679 Malignant neoplasm of bladder, unspecified: Secondary | ICD-10-CM

## 2020-05-30 DIAGNOSIS — Z125 Encounter for screening for malignant neoplasm of prostate: Secondary | ICD-10-CM

## 2020-05-30 DIAGNOSIS — Z1159 Encounter for screening for other viral diseases: Secondary | ICD-10-CM

## 2020-05-30 DIAGNOSIS — Z0000 Encounter for general adult medical examination without abnormal findings: Secondary | ICD-10-CM

## 2020-05-30 MED ORDER — LOSARTAN POTASSIUM 50 MG PO TABS
50.0000 mg | ORAL_TABLET | Freq: Every day | ORAL | 1 refills | Status: DC
Start: 2020-05-30 — End: 2020-06-04

## 2020-05-30 MED ORDER — OZEMPIC (0.25 OR 0.5 MG/DOSE) 2 MG/1.5ML ~~LOC~~ SOPN: 0.5000 mg | PEN_INJECTOR | SUBCUTANEOUS | 0 refills | Status: DC

## 2020-05-30 MED ORDER — OZEMPIC (0.25 OR 0.5 MG/DOSE) 2 MG/1.5ML ~~LOC~~ SOPN
1.0000 mg | PEN_INJECTOR | SUBCUTANEOUS | 0 refills | Status: DC
Start: 2020-05-30 — End: 2022-09-14

## 2020-05-30 MED ORDER — PRAVASTATIN SODIUM 20 MG PO TABS
20.0000 mg | ORAL_TABLET | Freq: Every day | ORAL | 1 refills | Status: DC
Start: 2020-05-30 — End: 2021-02-03

## 2020-05-30 NOTE — Patient Instructions (Addendum)
Thank you for coming to the office today.  Pneumonia shot today next in 1 year  Repeat A1c stay tuned  Ozempic sample.  Shingrix at pharmacy.  Grayland Ormond will call you (pharmacist to help with the Ozempic application)   DUE for FASTING BLOOD WORK (no food or drink after midnight before the lab appointment, only water or coffee without cream/sugar on the morning of)  SCHEDULE "Lab Only" visit in the morning at the clinic for lab draw in 6 MONTHS   - Make sure Lab Only appointment is at about 1 week before your next appointment, so that results will be available  For Lab Results, once available within 2-3 days of blood draw, you can can log in to MyChart online to view your results and a brief explanation. Also, we can discuss results at next follow-up visit.   Please schedule a Follow-up Appointment to: Return in about 6 months (around 11/27/2020) for 6 month fasting lab only then 1 week later Annual Physical.  If you have any other questions or concerns, please feel free to call the office or send a message through [place]. You may also schedule an earlier appointment if necessary.  Additionally, you may be receiving a survey about your experience at our office within a few days to 1 week by e-mail or mail. We value your feedback.  Nobie Putnam, DO [place]

## 2020-05-30 NOTE — Assessment & Plan Note (Signed)
Controlled T2DM last A1c 6.9 Now off med needs sample and re order - financial assistance PAP for Ozempic No hypoglycemia or hyperglycemia Complications - peripheral neuropathy, hyperlipidemia, obesity - increases risk of future cardiovascular complications  OFF Metformin, patient preference.  Plan:  1. CONTINUE Ozempic 1mg  weekly inj - sample given - he can use 0.5mg  weekly to stretch it for 4 weeks or double dose for 0.5mg  x 2 = 1mg  weekly for 2 weeks / dosage - Will refer to [place] for Ozempic 1mg  weekly PAP financial assistance 2. Encourage improved lifestyle - low carb, low sugar diet, reduce portion size, continue improving regular exercise 3. Check CBG, bring log to next visit for review 4. DM Foot exam Future needs DM eye

## 2020-05-30 NOTE — Assessment & Plan Note (Signed)
Controlled cholesterol on statin and lifestyle Last lipid panel 2020 - overdue for next time  Plan: 1. Continue current meds - Pravastatin 20mg  daily 2. Encourage improved lifestyle - low carb/cholesterol, reduce portion size, continue improving regular exercise  6 month labs

## 2020-05-30 NOTE — Progress Notes (Signed)
Subjective:    [name], male    DOB: 05-Aug-1955, 65 y.o.   MRN: [id number]  Richard Oconnell is a 65 y.o. male presenting on 05/30/2020 for Diabetes   HPI   CHRONIC DM, Type 2with neuropathy/Overweight BMI >29 - Last visit with 02/2019, for same problem, treated withonset GLP1 ozempic increase from 0.5 up to 1mg , see prior notes for background information. - Interval update previously did well on Ozempic. Needs new orders, he is working on PAP financial assistance for [employer] from company has forms. - never took Musician Meds: Ozempic 1mg  weekly injection (out of med) - OFF Metformin by his choice - Significant improved diet - Weight down to 240 approx now back up 270s off medicine Due DM Eye - he will schedule On ARB IMPROVED - Mild reduced nerve sensation in feet, occasional burning episodic only, some chronic neuropathy. Denies any hypoglycemia, polyuria, visual changes, persistent numbness tingling  CHRONIC HTN: Reportsno new concerns, now improved BP with lifestyle Current Meds -Losartan 50mg  daily refill today Denies CP, dyspnea, HA, edema, dizziness / lightheadedness  HYPERLIPIDEMIA: - Reports no concerns. Last lipid panel 2020 - Currently taking Pravastatin 20mg  daily needs re order, tolerating well without side effects or myalgias  Follow-up Bladder Cancer Reviewed prior records Followed by Dr Emmit Alexanders - prior cysto and upcoming BCG maintenance treatment. Last treatment 03/2020    Health Maintenance:   Colon CA Screening: Never had colonoscopy. Currently asymptomatic. No known family history of colon CA. Due for screening test order refer to GI now  Due for Flu Shot, declines today despite counseling on benefits  Due for initial pneumonia vaccine at age 3 - will receive Prevnar-13 today   Depression screen [facility] 2/9 05/30/2020 03/09/2019 12/06/2018  Decreased Interest 0 0 0  Down, Depressed, Hopeless 0 0 0  PHQ - 2  Score 0 0 0    Social History   Tobacco Use  . Smoking status: Never Smoker  . Smokeless tobacco: Never Used  . Tobacco comment: once a while had one cigar >10 years ago  Vaping Use  . Vaping Use: Never used  Substance Use Topics  . Alcohol use: No    Alcohol/week: 0.0 standard drinks  . Drug use: No    Review of Systems Per HPI unless specifically indicated above     Objective:    BP 136/84 (BP Location: Left Arm, Cuff Size: Normal)   Pulse 77   Ht 6\' 4"  (1.93 m)   Wt 270 lb 3.2 oz (122.6 kg)   SpO2 100%   BMI 32.89 kg/m   Wt Readings from Last 3 Encounters:  05/30/20 270 lb 3.2 oz (122.6 kg)  03/09/19 238 lb (108 kg)  12/06/18 249 lb (112.9 kg)    Physical Exam Vitals and nursing note reviewed.  Constitutional:      General: He is not in acute distress.    Appearance: He is well-developed and well-nourished. He is obese. He is not diaphoretic.     Comments: Well-appearing, comfortable, cooperative  HENT:     Head: Normocephalic and atraumatic.     Mouth/Throat:     Mouth: Oropharynx is clear and moist.  Eyes:     General:        Right eye: No discharge.        Left eye: No discharge.     Conjunctiva/sclera: Conjunctivae normal.  Neck:     Thyroid: No thyromegaly.  Cardiovascular:  Rate and Rhythm: Normal rate and regular rhythm.     Pulses: Intact distal pulses.     Heart sounds: Normal heart sounds. No murmur heard.   Pulmonary:     Effort: Pulmonary effort is normal. No respiratory distress.     Breath sounds: Normal breath sounds. No wheezing or rales.  Musculoskeletal:        General: No edema. Normal range of motion.     Cervical back: Normal range of motion and neck supple.  Lymphadenopathy:     Cervical: No cervical adenopathy.  Skin:    General: Skin is warm and dry.     Findings: No erythema or rash.  Neurological:     Mental Status: He is alert and oriented to person, place, and time.  Psychiatric:        Mood and Affect: Mood and  affect normal.        Behavior: Behavior normal.     Comments: Well groomed, good eye contact, normal speech and thoughts      Recent Labs    12/27/19 0000  HGBA1C 6.9   Unable to get A1c POC to work today will draw lab.  Diabetic Foot Exam - Simple   Simple Foot Form Diabetic Foot exam was performed with the following findings: Yes 05/30/2020 10:13 AM  Visual Inspection No deformities, no ulcerations, no other skin breakdown bilaterally: Yes Sensation Testing Intact to touch and monofilament testing bilaterally: Yes Pulse Check Posterior Tibialis and Dorsalis pulse intact bilaterally: Yes Comments      Results for orders placed or performed in visit on 05/30/20  Hemoglobin A1c  Result Value Ref Range   Hemoglobin A1C 6.9       Assessment & Plan:   Problem List Items Addressed This Visit    Type 2 diabetes mellitus with diabetic polyneuropathy, without long-term current use of insulin ([place]) - Primary    Controlled T2DM last A1c 6.9 Now off med needs sample and re order - financial assistance PAP for Ozempic No hypoglycemia or hyperglycemia Complications - peripheral neuropathy, hyperlipidemia, obesity - increases risk of future cardiovascular complications  OFF Metformin, patient preference.  Plan:  1. CONTINUE Ozempic 1mg  weekly inj - sample given - he can use 0.5mg  weekly to stretch it for 4 weeks or double dose for 0.5mg  x 2 = 1mg  weekly for 2 weeks / dosage - Will refer to [place] for Ozempic 1mg  weekly PAP financial assistance 2. Encourage improved lifestyle - low carb, low sugar diet, reduce portion size, continue improving regular exercise 3. Check CBG, bring log to next visit for review 4. DM Foot exam Future needs DM eye      Relevant Medications   OZEMPIC, 0.25 OR 0.5 MG/DOSE, 2 MG/1.5ML SOPN   losartan (COZAAR) 50 MG tablet   pravastatin (PRAVACHOL) 20 MG tablet   Other Relevant Orders   Hemoglobin A1c   AMB Referral to Community Care  Coordinaton   Hypertension    Normal BP, controlled On ARB No known complications    Plan:  1. Refill Losartan 50mg  daily 2. Encourage improved lifestyle - low sodium diet, regular exercise 3. Continue monitor BP outside office, bring readings to next visit, if persistently >140/90 or new symptoms notify office sooner      Relevant Medications   losartan (COZAAR) 50 MG tablet   pravastatin (PRAVACHOL) 20 MG tablet   Hyperlipidemia associated with type 2 diabetes mellitus ([place])    Controlled cholesterol on statin and lifestyle Last lipid  panel 2020 - overdue for next time  Plan: 1. Continue current meds - Pravastatin 20mg  daily 2. Encourage improved lifestyle - low carb/cholesterol, reduce portion size, continue improving regular exercise  6 month labs      Relevant Medications   OZEMPIC, 0.25 OR 0.5 MG/DOSE, 2 MG/1.5ML SOPN   losartan (COZAAR) 50 MG tablet   pravastatin (PRAVACHOL) 20 MG tablet    Other Visit Diagnoses    Need for vaccination with 13-polyvalent pneumococcal conjugate vaccine       Relevant Orders   Pneumococcal conjugate vaccine 13-valent IM (Completed)   Screening for colon cancer       Relevant Orders   Ambulatory referral to Gastroenterology      Meds ordered this encounter  Medications  . DISCONTD: OZEMPIC, 0.25 OR 0.5 MG/DOSE, 2 MG/1.5ML SOPN    Sig: Inject 0.5 mg into the skin once a week.    Dispense:  1.5 mL    Refill:  0  . OZEMPIC, 0.25 OR 0.5 MG/DOSE, 2 MG/1.5ML SOPN    Sig: Inject 1 mg into the skin once a week.    Dispense:  1.5 mL    Refill:  0  . losartan (COZAAR) 50 MG tablet    Sig: Take 1 tablet (50 mg total) by mouth daily.    Dispense:  90 tablet    Refill:  1  . pravastatin (PRAVACHOL) 20 MG tablet    Sig: Take 1 tablet (20 mg total) by mouth daily.    Dispense:  90 tablet    Refill:  1   Patient has paperwork completed for his PAP for Ozempic - needs to get 120 day supply for donut hole coverage - will forward chart to  [place] and Harlow Asa [place]  Completed form - Fax to  [employer]. [place], [place] Management  Pharmacy Ph: [phone] Fax [phone]  Follow up plan: Return in about 6 months (around 11/27/2020) for 6 month fasting lab only then 1 week later Annual Physical.  Nobie Putnam, DO [facility] 05/30/2020, 9:45 AM

## 2020-05-30 NOTE — Assessment & Plan Note (Signed)
Normal BP, controlled On ARB No known complications    Plan:  1. Refill Losartan 50mg  daily 2. Encourage improved lifestyle - low sodium diet, regular exercise 3. Continue monitor BP outside office, bring readings to next visit, if persistently >140/90 or new symptoms notify office sooner

## 2020-05-31 LAB — HEMOGLOBIN A1C
Hgb A1c MFr Bld: 7.2 % of total Hgb — ABNORMAL HIGH (ref <5.7)
Mean Plasma Glucose: 160 mg/dL
eAG (mmol/L): 8.9 mmol/L

## 2020-06-02 ENCOUNTER — Telehealth: Payer: Self-pay

## 2020-06-02 NOTE — Chronic Care Management (AMB) (Signed)
  Chronic Care Management   Note  06/02/2020 Name: Richard Oconnell MRN: [id number] DOB: 03/14/1956  Richard Oconnell is a 65 y.o. year old male who is a primary care patient of [name], DO. I reached out to [name] by phone today in response to a referral sent by Richard Oconnell's PCP, Dr.Karamalegos     Richard Oconnell was given information about Chronic Care Management services today including:  1. CCM service includes personalized support from designated clinical staff supervised by his physician, including individualized plan of care and coordination with other care providers 2. 24/7 contact phone numbers for assistance for urgent and routine care needs. 3. Service will only be billed when office clinical staff spend 20 minutes or more in a month to coordinate care. 4. Only one practitioner may furnish and bill the service in a calendar month. 5. The patient may stop CCM services at any time (effective at the end of the month) by phone call to the office staff. 6. The patient will be responsible for cost sharing (co-pay) of up to 20% of the service fee (after annual deductible is met).  Patient agreed to services and verbal consent obtained.   Follow up plan: Telephone appointment with care management team member scheduled for:06/13/2020  Noreene Larsson, [place], [place], [place] [address] Direct Dial: [phone] [email or web address]: [email or web address]

## 2020-06-04 ENCOUNTER — Other Ambulatory Visit: Payer: Self-pay | Admitting: Family Medicine

## 2020-06-04 DIAGNOSIS — I10 Essential (primary) hypertension: Secondary | ICD-10-CM

## 2020-06-04 MED ORDER — VALSARTAN 80 MG PO TABS: 80.0000 mg | ORAL_TABLET | Freq: Every day | ORAL | 3 refills | Status: DC

## 2020-06-05 ENCOUNTER — Telehealth (INDEPENDENT_AMBULATORY_CARE_PROVIDER_SITE_OTHER): Payer: Self-pay | Admitting: Gastroenterology

## 2020-06-05 ENCOUNTER — Other Ambulatory Visit: Payer: Self-pay

## 2020-06-05 DIAGNOSIS — Z1211 Encounter for screening for malignant neoplasm of colon: Secondary | ICD-10-CM

## 2020-06-05 MED ORDER — PEG 3350-KCL-NA BICARB-NACL 420 G PO SOLR: 4000.0000 mL | Freq: Once | ORAL | 0 refills | Status: AC

## 2020-06-05 NOTE — Progress Notes (Signed)
Gastroenterology Pre-Procedure Review  Request Date: Thursday 02/17 Requesting Physician: Dr. Vicente Males  PATIENT REVIEW QUESTIONS: The patient responded to the following health history questions as indicated:    1. Are you having any GI issues? no 2. Do you have a personal history of Polyps? no 3. Do you have a family history of Colon Cancer or Polyps? no 4. Diabetes Mellitus? yes (type 2) 5. Joint replacements in the past 12 months?no 6. Major health problems in the past 3 months?no 7. Any artificial heart valves, MVP, or defibrillator?no    MEDICATIONS & ALLERGIES:    Patient reports the following regarding taking any anticoagulation/antiplatelet therapy:   Plavix, Coumadin, Eliquis, Xarelto, Lovenox, Pradaxa, Brilinta, or Effient? no Aspirin? no  Patient confirms/reports the following medications:  Current Outpatient Medications  Medication Sig Dispense Refill  . acetaminophen (TYLENOL) 500 MG tablet Take 500-1,000 mg by mouth every 8 (eight) hours as needed for mild pain.     . calcium carbonate (TUMS - DOSED IN MG ELEMENTAL CALCIUM) 500 MG chewable tablet Chew 2 tablets by mouth daily as needed for indigestion or heartburn.    . diphenhydramine-acetaminophen (TYLENOL PM) 25-500 MG TABS tablet Take 1-2 tablets by mouth at bedtime as needed (sleep).     Marland Kitchen OZEMPIC, 0.25 OR 0.5 MG/DOSE, 2 MG/1.5ML SOPN Inject 1 mg into the skin once a week. 1.5 mL 0  . polyethylene glycol-electrolytes (NULYTELY) 420 g solution Take 4,000 mLs by mouth once for 1 dose. 4000 mL 0  . pravastatin (PRAVACHOL) 20 MG tablet Take 1 tablet (20 mg total) by mouth daily. 90 tablet 1  . valsartan (DIOVAN) 80 MG tablet Take 1 tablet (80 mg total) by mouth daily. 90 tablet 3  . losartan-hydrochlorothiazide (HYZAAR) 50-12.5 MG tablet Take 1 tablet by mouth daily.    . sildenafil (REVATIO) 20 MG tablet Take 1-5 pills about 30 min prior to sex (Patient not taking: No sig reported) 50 tablet 2   No current  facility-administered medications for this visit.    Patient confirms/reports the following allergies:  Allergies  Allergen Reactions  . Cyclobenzaprine Other (See Comments)    Not improving pain and makes him agressive.  . Lisinopril Cough    Orders Placed This Encounter  Procedures  . Procedural/ Surgical Case Request: COLONOSCOPY WITH PROPOFOL    Standing Status:   Standing    Number of Occurrences:   1    Order Specific Question:   Pre-op diagnosis    Answer:   screening colonoscopy    Order Specific Question:   CPT Code    Answer:   79024    AUTHORIZATION INFORMATION Primary Insurance: 1D#: Group #:  Secondary Insurance: 1D#: Group #:  SCHEDULE INFORMATION: Date:07/03/20  Time: Location:[facility]

## 2020-06-06 ENCOUNTER — Ambulatory Visit: Payer: Medicare Other | Admitting: Pharmacist

## 2020-06-06 DIAGNOSIS — I10 Essential (primary) hypertension: Secondary | ICD-10-CM

## 2020-06-06 DIAGNOSIS — E1142 Type 2 diabetes mellitus with diabetic polyneuropathy: Secondary | ICD-10-CM

## 2020-06-06 NOTE — Chronic Care Management (AMB) (Signed)
Chronic Care Management Pharmacy Note  06/06/2020 Name:  Richard Oconnell MRN:  [id number] DOB:  [birth year]  Subjective: Richard Oconnell is an 65 y.o. year old male who is a primary patient of [name], DO.  The CCM team was consulted for assistance with disease management and care coordination needs.    Engaged with patient by telephone for initial visit in response to provider referral for pharmacy case management and/or care coordination services.   Consent to Services:  The patient was given the following information about Chronic Care Management services today, agreed to services, and gave verbal consent: 1. CCM service includes personalized support from designated clinical staff supervised by the primary care provider, including individualized plan of care and coordination with other care providers 2. 24/7 contact phone numbers for assistance for urgent and routine care needs. 3. Service will only be billed when office clinical staff spend 20 minutes or more in a month to coordinate care. 4. Only one practitioner may furnish and bill the service in a calendar month. 5.The patient may stop CCM services at any time (effective at the end of the month) by phone call to the office staff. 6. The patient will be responsible for cost sharing (co-pay) of up to 20% of the service fee (after annual deductible is met). Patient agreed to services and consent obtained.  Objective:  Lab Results  Component Value Date   CREATININE 1.14 10/14/2017   CREATININE 1.03 11/24/2016   CREATININE 0.95 11/23/2016    Lab Results  Component Value Date   HGBA1C 7.2 (H) 05/30/2020       Component Value Date/Time   CHOL 181 11/24/2016 0818   TRIG 256 (H) 11/24/2016 0818   HDL 31 (L) 11/24/2016 0818   CHOLHDL 5.8 (H) 11/24/2016 0818   VLDL 51 (H) 11/24/2016 0818   LDLCALC 99 11/24/2016 0818    BP Readings from Last 3 Encounters:  05/30/20 136/84  03/09/19 126/77  12/06/18  112/67    Assessment: Review of patient past medical history, allergies, medications, health status, including review of consultants reports, laboratory and other test data, was performed as part of comprehensive evaluation and provision of chronic care management services.   SDOH:  (Social Determinants of Health) assessments and interventions performed:    CCM Care Plan  Allergies  Allergen Reactions  . Cyclobenzaprine Other (See Comments)    Not improving pain and makes him agressive.  . Lisinopril Cough    Medications Reviewed Today    Reviewed by Vella Raring, [place] (Pharmacist) on 06/06/20 at 1406  Med List Status: <None>  Medication Order Taking? Sig Documenting Provider Last Dose Status Informant  acetaminophen (TYLENOL) 500 MG tablet [id number] No Take 500-1,000 mg by mouth every 8 (eight) hours as needed for mild pain.   Patient not taking: Reported on 06/06/2020   [provider] Not Taking Active Self           Med Note [place], AMBER G   Thu Mar 24, 2017 10:18 AM)    calcium carbonate (TUMS - DOSED IN MG ELEMENTAL CALCIUM) 500 MG chewable tablet [id number] Yes Chew 2 tablets by mouth daily as needed for indigestion or heartburn. [provider] Taking Active Self  diphenhydramine-acetaminophen (TYLENOL PM) 25-500 MG TABS tablet [id number] Yes Take 1-2 tablets by mouth at bedtime as needed (sleep).  [provider] Taking Active Self  OZEMPIC, 0.25 OR 0.5 MG/DOSE, 2 MG/1.5ML SOPN [id number] Yes Inject 1 mg into the skin  once a week. [name], DO Taking Active Self           Med Note [facility], Bettylou Frew A   Fri Jun 06, 2020 10:12 AM) On Thursdays  pravastatin (PRAVACHOL) 20 MG tablet [id number] Yes Take 1 tablet (20 mg total) by mouth daily. [name], DO Taking Active Self  sildenafil (REVATIO) 20 MG tablet [id number] No Take 1-5 pills about 30 min prior to sex  Patient not taking: No sig reported   [name], DO Not Taking Active Self  valsartan (DIOVAN) 80 MG tablet [id number] Yes Take 1 tablet (80 mg total) by mouth daily. [name], DO Taking Active Self          Patient Active Problem List   Diagnosis Date Noted  . Class 1 obesity due to excess calories with serious comorbidity and body mass index (BMI) of 33.0 to 33.9 in adult 07/06/2019  . Malignant neoplasm of urinary bladder ([place]) 12/06/2018  . Overweight (BMI 25.0-29.9) 07/04/2018  . Hyperlipidemia associated with type 2 diabetes mellitus ([place]) 11/30/2016  . 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 Gross hematuria 11/16/2016  . Erectile dysfunction associated with type 2 diabetes mellitus ([place]) 05/26/2016  . Diabetes mellitus with coincident hypertension ([place]) 06/11/2015  . Type 2 diabetes mellitus with diabetic polyneuropathy, without long-term current use of insulin ([place]) 06/11/2015    Conditions to be addressed/monitored: HTN and DMII  Care Plan : PharmD - Medication Assistance  Updates made by Vella Raring, [place] since 06/06/2020 12:00 AM    Problem: Disease Progression     Long-Range Goal: Disease Progression Prevented or Minimized   Start Date: 06/06/2020  Expected End Date: 09/04/2020  This Visit's Progress: On track  Priority: High  Note:   Current Barriers:  . Difficulty with independently affording treatment regimen. Reports cost of Ozempic is difficult to afford, particularly when enters coverage gap of [name]  Pharmacist Clinical Goal(s):  Marland Kitchen Over the next 90 days, patient will verbalize ability to afford treatment regimen through collaboration with PharmD and provider.   Interventions: . 1:1 collaboration with [name], DO regarding development and update of comprehensive plan of care as evidenced by provider attestation and co-signature . Inter-disciplinary care team collaboration (see longitudinal plan of care) . Comprehensive medication review  performed; medication list updated in electronic medical record o Patient reports occasionally taking Tylenol PM as sleep aid. Encourage patient to discontinue OTC diphenhydramine-containing product due to risk of anticholinergic side effects (and limited benefit due to tolerance to hypnotic effect with long term use) - Patient verbalizes understanding and states has not used product recently as insomnia improved over past couple of months - Patient denies need for sleep hygiene counseling today - Encourage patient to follow up with provider in future if needed for insomnia  Diabetes: . Current treatment: Ozempic 1 mg weekly  . Current glucose readings: fasting glucose ranging 120s-130s . Encourage patient to continue to monitor, keep log of results and bring record with him to medical appointments  Hypertension: . Current treatment: valsartan 80 mg daily o Note per patient and review of chart, patient recently switched to valsartan as losartan on back order . Reports has home upper arm monitor . Reports latest home readings: o 1/12: 135/81, HR 85 o 1/9: 140/90, HR 61 . Encourage patient to continue to monitor home BP, keep log of results and bring record with him to medical appointments  Medication Assistance: . Based on  reported income, patient meets requirement to apply for Extra Help subsidy  . Counsel patient on process for applying for Extra Help subsidy online through [employer] o Patient states he will complete this application with assistance of cousin o Advise patient to expect response from [employer] via mail in ~4 weeks and, whether approved or denied, to retain a copy of this letter. . Counsel patient on option of applying for patient assistance for Ozempic from [name] if denied for Extra Help subsidy o Reports currently has sufficient supply of Ozempic at home  Patient Goals/Self-Care Activities . Over the next 90 days, patient will:  - check  glucose, document, and provide at future appointments - check blood pressure, document, and provide at future appointments - collaborate with provider on medication access solutions  Follow Up Plan: Telephone follow up appointment with care management team member scheduled for: 06/13/2020 at 10 am      Follow Up:  Patient agrees to Care Plan and Follow-up.  Harlow Asa, PharmD, [place] [phone]

## 2020-06-06 NOTE — Patient Instructions (Signed)
Visit Information   Goals:  Find out if eligible for Extra Help subsidy by completing application online at: https://www.ssa.gov/benefits/medicare/prescriptionhelp.html   Patient Care Plan: PharmD - Medication Assistance    Problem Identified: Disease Progression     Long-Range Goal: Disease Progression Prevented or Minimized   Start Date: 06/06/2020  Expected End Date: 09/04/2020  This Visit's Progress: On track  Priority: High  Note:   Current Barriers:  . Difficulty with independently affording treatment regimen. Reports cost of Ozempic is difficult to afford, particularly when enters coverage gap of [name]  Pharmacist Clinical Goal(s):  . Over the next 90 days, patient will verbalize ability to afford treatment regimen through collaboration with PharmD and provider.   Interventions: . 1:1 collaboration with Karamalegos, Alexander J, DO regarding development and update of comprehensive plan of care as evidenced by provider attestation and co-signature . Inter-disciplinary care team collaboration (see longitudinal plan of care) . Comprehensive medication review performed; medication list updated in electronic medical record o Patient reports occasionally taking Tylenol PM as sleep aid. Encourage patient to discontinue OTC diphenhydramine-containing product due to risk of anticholinergic side effects (and limited benefit due to tolerance to hypnotic effect with long term use) - Patient verbalizes understanding and states has not used product recently as insomnia improved over past couple of months - Patient denies need for sleep hygiene counseling today - Encourage patient to follow up with provider in future if needed for insomnia  Diabetes: . Current treatment: Ozempic 1 mg weekly  . Current glucose readings: fasting glucose ranging 120s-130s . Encourage patient to continue to monitor, keep log of results and bring record with him to medical  appointments  Hypertension: . Current treatment: valsartan 80 mg daily o Note per patient and review of chart, patient recently switched to valsartan as losartan on back order . Reports has home upper arm monitor . Reports latest home readings: o 1/12: 135/81, HR 85 o 1/9: 140/90, HR 61 . Encourage patient to continue to monitor home BP, keep log of results and bring record with him to medical appointments  Medication Assistance: . Based on reported income, patient meets requirement to apply for Extra Help subsidy  . Counsel patient on process for applying for Extra Help subsidy online through Social Security website o Patient states he will complete this application with assistance of cousin o Advise patient to expect response from Social Security via mail in ~4 weeks and, whether approved or denied, to retain a copy of this letter. . Counsel patient on option of applying for patient assistance for Ozempic from [employer] if denied for Extra Help subsidy o Reports currently has sufficient supply of Ozempic at home  Patient Goals/Self-Care Activities . Over the next 90 days, patient will:  - check glucose, document, and provide at future appointments - check blood pressure, document, and provide at future appointments - collaborate with provider on medication access solutions  Follow Up Plan: Telephone follow up appointment with care management team member scheduled for: 06/13/2020 at 10 am      Richard Oconnell was given information about Chronic Care Management services today including:  1. CCM service includes personalized support from designated clinical staff supervised by his physician, including individualized plan of care and coordination with other care providers 2. 24/7 contact phone numbers for assistance for urgent and routine care needs. 3. Service will only be billed when office clinical staff spend 20 minutes or more in a month to coordinate care. 4.   Only one practitioner  may furnish and bill the service in a calendar month. 5. The patient may stop CCM services at any time (effective at the end of the month) by phone call to the office staff. 6. The patient will be responsible for cost sharing (co-pay) of up to 20% of the service fee (after annual deductible is met).  Patient agreed to services and verbal consent obtained.   The patient verbalized understanding of instructions, educational materials, and care plan provided today and declined offer to receive copy of patient instructions, educational materials, and care plan.    , PharmD, BCACP Clinical Pharmacist [facility] [place] [phone]   

## 2020-06-11 ENCOUNTER — Telehealth: Payer: Self-pay | Admitting: Gastroenterology

## 2020-06-11 NOTE — Telephone Encounter (Signed)
Returned patients call. Went over insulin instructions with patient. Pt states he will take all medications after procedure. Pt verbalized understanding.

## 2020-06-11 NOTE — Telephone Encounter (Signed)
Patient has questions regarding his diabetic meds the day of his procedure

## 2020-06-13 ENCOUNTER — Telehealth: Payer: Medicare Other

## 2020-06-13 ENCOUNTER — Ambulatory Visit: Payer: Self-pay | Admitting: Pharmacist

## 2020-06-13 DIAGNOSIS — E1142 Type 2 diabetes mellitus with diabetic polyneuropathy: Secondary | ICD-10-CM

## 2020-06-13 NOTE — Chronic Care Management (AMB) (Signed)
Chronic Care Management Pharmacy Note  06/13/2020 Name:  Richard Oconnell MRN:  [id number] DOB:  1955-12-18  Subjective: Richard Oconnell is an 65 y.o. year old male who is a primary patient of [name], DO.  The CCM team was consulted for assistance with disease management and care coordination needs.    Engaged with patient by telephone for follow up visit in response to provider referral for pharmacy case management and/or care coordination services.   Consent to Services:  The patient was given information about Chronic Care Management services, agreed to services, and gave verbal consent prior to initiation of services.  Please see initial visit note for detailed documentation.   Objective:  Lab Results  Component Value Date   CREATININE 1.14 10/14/2017   CREATININE 1.03 11/24/2016   CREATININE 0.95 11/23/2016    Lab Results  Component Value Date   HGBA1C 7.2 (H) 05/30/2020       Component Value Date/Time   CHOL 181 11/24/2016 0818   TRIG 256 (H) 11/24/2016 0818   HDL 31 (L) 11/24/2016 0818   CHOLHDL 5.8 (H) 11/24/2016 0818   VLDL 51 (H) 11/24/2016 0818   LDLCALC 99 11/24/2016 0818     BP Readings from Last 3 Encounters:  05/30/20 136/84  03/09/19 126/77  12/06/18 112/67    Assessment: Review of patient past medical history, allergies, medications, health status, including review of consultants reports, laboratory and other test data, was performed as part of comprehensive evaluation and provision of chronic care management services.   SDOH:  (Social Determinants of Health) assessments and interventions performed: none   CCM Care Plan  Allergies  Allergen Reactions  . Cyclobenzaprine Other (See Comments)    Not improving pain and makes him agressive.  . Lisinopril Cough    Medications Reviewed Today    Reviewed by Vella Raring, [place] (Pharmacist) on 06/06/20 at 1406  Med List Status: <None>  Medication Order Taking? Sig  Documenting Provider Last Dose Status Informant  acetaminophen (TYLENOL) 500 MG tablet [id number] No Take 500-1,000 mg by mouth every 8 (eight) hours as needed for mild pain.   Patient not taking: Reported on 06/06/2020   [provider] Not Taking Active Self           Med Note Perry [place], AMBER G   Thu Mar 24, 2017 10:18 AM)    calcium carbonate (TUMS - DOSED IN MG ELEMENTAL CALCIUM) 500 MG chewable tablet [id number] Yes Chew 2 tablets by mouth daily as needed for indigestion or heartburn. [provider] Taking Active Self  diphenhydramine-acetaminophen (TYLENOL PM) 25-500 MG TABS tablet [id number] Yes Take 1-2 tablets by mouth at bedtime as needed (sleep).  [provider] Taking Active Self  OZEMPIC, 0.25 OR 0.5 MG/DOSE, 2 MG/1.5ML SOPN [id number] Yes Inject 1 mg into the skin once a week. [name], DO Taking Active Self           Med Note [facility], Zaylen Susman A   Fri Jun 06, 2020 10:12 AM) On Thursdays  pravastatin (PRAVACHOL) 20 MG tablet [id number] Yes Take 1 tablet (20 mg total) by mouth daily. [name], DO Taking Active Self  sildenafil (REVATIO) 20 MG tablet [id number] No Take 1-5 pills about 30 min prior to sex  Patient not taking: No sig reported   [name], DO Not Taking Active Self  valsartan (DIOVAN) 80 MG tablet [id number] Yes Take 1 tablet (80 mg total) by mouth daily. [name], DO  Taking Active Self          Patient Active Problem List   Diagnosis Date Noted  . Class 1 obesity due to excess calories with serious comorbidity and body mass index (BMI) of 33.0 to 33.9 in adult 07/06/2019  . Malignant neoplasm of urinary bladder ([place]) 12/06/2018  . Overweight (BMI 25.0-29.9) 07/04/2018  . Hyperlipidemia associated with type 2 diabetes mellitus ([place]) 11/30/2016  . 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 Gross hematuria 11/16/2016  . Erectile dysfunction associated with type 2 diabetes mellitus ([place])  05/26/2016  . Diabetes mellitus with coincident hypertension ([place]) 06/11/2015  . Type 2 diabetes mellitus with diabetic polyneuropathy, without long-term current use of insulin ([place]) 06/11/2015    Conditions to be addressed/monitored: T2DM, HTN, medication assistance  Care Plan : PharmD - Medication Assistance  Updates made by Vella Raring, [place] since 06/13/2020 12:00 AM    Problem: Disease Progression     Long-Range Goal: Disease Progression Prevented or Minimized   Start Date: 06/06/2020  Expected End Date: 09/04/2020  Recent Progress: On track  Priority: High  Note:   Current Barriers:  . Difficulty with independently affording treatment regimen. Reports cost of Ozempic is difficult to afford, particularly when enters coverage gap of [name]  Pharmacist Clinical Goal(s):  Marland Kitchen Over the next 90 days, patient will verbalize ability to afford treatment regimen through collaboration with PharmD and provider.   Interventions: . 1:1 collaboration with [name], DO regarding development and update of comprehensive plan of care as evidenced by provider attestation and co-signature . Inter-disciplinary care team collaboration (see longitudinal plan of care)  Medication Assistance: . Based on reported income, had advised patient to apply for Extra Help subsidy  o Today patient reports that his cousin helped him with the Extra Help application, but patient unable to confirm whether extra help application was submitted. o Reports that he called Social Security and was informed that he does not qualify for extra help based on assets o Additionally, patient states that he received a call telling him that he was approved for patient assistance for Ozempic from manufacturer, but patient denies having submitted application to [name] - Patient states that he will call [name] patient assistance program today to confirm that he is enrolled. Provide  patient with phone number to [name] [phone]) - Receive call back from patient letting me know that [employer] had no record of his application and advised him to submit application to program. Patient states representative told him that [name] will not require a denial letter from extra help for him . Will collaborate with [place] Simcox for her help to patient with applying for patient assistance for Ozempic from [name]  Patient Goals/Self-Care Activities . Over the next 90 days, patient will:  - check glucose, document, and provide at future appointments - check blood pressure, document, and provide at future appointments - collaborate with provider on medication access solutions  Follow Up Plan: Telephone follow up appointment with care management team member scheduled for: 07/14/2020 at 3:15 pm     Follow Up:  Patient agrees to Care Plan and Follow-up.  Harlow Asa, PharmD, [place] [phone]

## 2020-06-13 NOTE — Patient Instructions (Signed)
Visit Information  Please complete and mail back medication assistance program application (and any additional documents if indicated) to [place] Simcox once received.  The patient verbalized understanding of instructions, educational materials, and care plan provided today and declined offer to receive copy of patient instructions, educational materials, and care plan.   Telephone follow up appointment with care management team member scheduled for:  07/14/2020 at 3:15 pm  Harlow Asa, PharmD, [place] [phone]

## 2020-07-01 ENCOUNTER — Other Ambulatory Visit: Payer: Self-pay

## 2020-07-01 ENCOUNTER — Other Ambulatory Visit
Admission: RE | Admit: 2020-07-01 | Discharge: 2020-07-01 | Disposition: A | Discharge status: Home / Self Care | Payer: Medicare Other | Source: Ambulatory Visit | Attending: Gastroenterology | Admitting: Gastroenterology

## 2020-07-01 DIAGNOSIS — Z20822 Contact with and (suspected) exposure to covid-19: Secondary | ICD-10-CM | POA: Diagnosis not present

## 2020-07-01 DIAGNOSIS — Z01812 Encounter for preprocedural laboratory examination: Secondary | ICD-10-CM | POA: Diagnosis not present

## 2020-07-01 LAB — SARS CORONAVIRUS 2 (TAT 6-24 HRS): SARS Coronavirus 2: NEGATIVE

## 2020-07-02 ENCOUNTER — Encounter: Payer: Self-pay | Admitting: Gastroenterology

## 2020-07-03 ENCOUNTER — Ambulatory Visit
Admission: RE | Admit: 2020-07-03 | Discharge: 2020-07-03 | Disposition: A | Discharge status: Home / Self Care | Payer: Medicare Other | Attending: Gastroenterology | Admitting: Gastroenterology

## 2020-07-03 ENCOUNTER — Ambulatory Visit: Payer: Medicare Other | Admitting: Certified Registered Nurse Anesthetist

## 2020-07-03 ENCOUNTER — Other Ambulatory Visit: Payer: Self-pay

## 2020-07-03 ENCOUNTER — Encounter: Payer: Self-pay | Admitting: Gastroenterology

## 2020-07-03 ENCOUNTER — Encounter
Admission: RE | Disposition: A | Discharge status: Home / Self Care | Payer: Self-pay | Source: Home / Self Care | Attending: Gastroenterology

## 2020-07-03 DIAGNOSIS — Z7984 Long term (current) use of oral hypoglycemic drugs: Secondary | ICD-10-CM | POA: Diagnosis not present

## 2020-07-03 DIAGNOSIS — K5730 Diverticulosis of large intestine without perforation or abscess without bleeding: Secondary | ICD-10-CM | POA: Diagnosis not present

## 2020-07-03 DIAGNOSIS — Z1211 Encounter for screening for malignant neoplasm of colon: Secondary | ICD-10-CM

## 2020-07-03 DIAGNOSIS — K640 First degree hemorrhoids: Secondary | ICD-10-CM | POA: Insufficient documentation

## 2020-07-03 DIAGNOSIS — Z888 Allergy status to other drugs, medicaments and biological substances status: Secondary | ICD-10-CM | POA: Diagnosis not present

## 2020-07-03 DIAGNOSIS — Z8551 Personal history of malignant neoplasm of bladder: Secondary | ICD-10-CM | POA: Diagnosis not present

## 2020-07-03 DIAGNOSIS — K5790 Diverticulosis of intestine, part unspecified, without perforation or abscess without bleeding: Secondary | ICD-10-CM | POA: Diagnosis not present

## 2020-07-03 DIAGNOSIS — E1169 Type 2 diabetes mellitus with other specified complication: Secondary | ICD-10-CM | POA: Diagnosis not present

## 2020-07-03 DIAGNOSIS — E1142 Type 2 diabetes mellitus with diabetic polyneuropathy: Secondary | ICD-10-CM | POA: Diagnosis not present

## 2020-07-03 DIAGNOSIS — E785 Hyperlipidemia, unspecified: Secondary | ICD-10-CM | POA: Diagnosis not present

## 2020-07-03 HISTORY — PX: COLONOSCOPY WITH PROPOFOL: SHX5780

## 2020-07-03 LAB — GLUCOSE, CAPILLARY: Glucose-Capillary: 159 mg/dL — ABNORMAL HIGH (ref 70–99)

## 2020-07-03 SURGERY — COLONOSCOPY WITH PROPOFOL
Anesthesia: General

## 2020-07-03 MED ORDER — LIDOCAINE HCL (CARDIAC) PF 100 MG/5ML IV SOSY
PREFILLED_SYRINGE | INTRAVENOUS | Status: DC | PRN
  Administered 2020-07-03: 50 mg via INTRAVENOUS

## 2020-07-03 MED ORDER — SODIUM CHLORIDE 0.9 % IV SOLN: INTRAVENOUS | Status: DC

## 2020-07-03 MED ORDER — PROPOFOL 500 MG/50ML IV EMUL
INTRAVENOUS | Status: DC | PRN
  Administered 2020-07-03: 160 ug/kg/min via INTRAVENOUS

## 2020-07-03 MED ORDER — PROPOFOL 10 MG/ML IV BOLUS
INTRAVENOUS | Status: DC | PRN
  Administered 2020-07-03: 80 mg via INTRAVENOUS

## 2020-07-03 MED ORDER — PHENYLEPHRINE HCL (PRESSORS) 10 MG/ML IV SOLN
INTRAVENOUS | Status: DC | PRN
  Administered 2020-07-03: 200 ug via INTRAVENOUS

## 2020-07-03 NOTE — Anesthesia Postprocedure Evaluation (Signed)
Anesthesia Post Note  [name]  Procedure(s) Performed: COLONOSCOPY WITH PROPOFOL (N/A )  Patient location during evaluation: Endoscopy Anesthesia Type: General Level of consciousness: awake Pain management: pain level controlled Vital Signs Assessment: post-procedure vital signs reviewed and stable Respiratory status: spontaneous breathing Cardiovascular status: stable Postop Assessment: no apparent nausea or vomiting Anesthetic complications: no   No complications documented.   Last Vitals:  Vitals:   07/03/20 0940 07/03/20 0950  BP: 114/87   Pulse: 98   Resp: 16 15  Temp:    SpO2: 95% 95%    Last Pain:  Vitals:   07/03/20 0920  TempSrc: Temporal  PainSc:                  Neva Seat

## 2020-07-03 NOTE — H&P (Signed)
Richard Bellows, MD [address] [place], [place], [place], [address] [address], [place], [place], [place], [address] Phone: [phone]  Fax: [phone]  Primary Care Physician:  [name], Oconnell   Pre-Procedure History & Physical: HPI:  Author [name] is a 65 y.o. male is here for an colonoscopy.   Past Medical History:  Diagnosis Date  . Cancer [facility])    Bladder Cancer  . Erectile dysfunction associated with type 2 diabetes mellitus ([place]) 05/26/2016  . Gross hematuria 11/16/2016  . Hypertension 06/11/2015  . Type 2 diabetes mellitus with diabetic polyneuropathy, without long-term current use of insulin ([place]) 06/11/2015    Past Surgical History:  Procedure Laterality Date  . boil lanceted     hand  . CYSTOSCOPY W/ RETROGRADES Bilateral 12/06/2016   Procedure: CYSTOSCOPY WITH RETROGRADE PYELOGRAM;  Surgeon: Hollice Espy, MD;  Location: [facility] ORS;  Service: Urology;  Laterality: Bilateral;  . CYSTOSCOPY W/ RETROGRADES Bilateral 06/27/2017   Procedure: CYSTOSCOPY WITH RETROGRADE PYELOGRAM;  Surgeon: Hollice Espy, MD;  Location: [facility] ORS;  Service: Urology;  Laterality: Bilateral;  . CYSTOSCOPY WITH BIOPSY N/A 06/27/2017   Procedure: CYSTOSCOPY WITH Bladder BIOPSY;  Surgeon: Hollice Espy, MD;  Location: [facility] ORS;  Service: Urology;  Laterality: N/A;  . CYSTOSCOPY WITH BIOPSY N/A 10/24/2017   Procedure: CYSTOSCOPY WITH Bladder BIOPSY;  Surgeon: Hollice Espy, MD;  Location: [facility] ORS;  Service: Urology;  Laterality: N/A;  . ingrown  Bilateral    ingrown toenail  . TRANSURETHRAL RESECTION OF BLADDER TUMOR N/A 12/06/2016   Procedure: TRANSURETHRAL RESECTION OF BLADDER TUMOR (TURBT) (2-5cm) CLOT EVACUATION;  Surgeon: Hollice Espy, MD;  Location: [facility] ORS;  Service: Urology;  Laterality: N/A;  . TRANSURETHRAL RESECTION OF BLADDER TUMOR N/A 06/27/2017   Procedure: TRANSURETHRAL RESECTION OF BLADDER TUMOR (TURBT);  Surgeon: Hollice Espy, MD;  Location:  [facility] ORS;  Service: Urology;  Laterality: N/A;  . TRANSURETHRAL RESECTION OF BLADDER TUMOR WITH MITOMYCIN-C N/A 01/18/2017   Procedure: TRANSURETHRAL RESECTION OF BLADDER TUMOR WITH MITOMYCIN-C-(SMALL);  Surgeon: Hollice Espy, MD;  Location: [facility] ORS;  Service: Urology;  Laterality: N/A;    Prior to Admission medications   Medication Sig Start Date End Date Taking? Authorizing Provider  acetaminophen (TYLENOL) 500 MG tablet Take 500-1,000 mg by mouth every 8 (eight) hours as needed for mild pain.   Yes [provider]  calcium carbonate (TUMS - DOSED IN MG ELEMENTAL CALCIUM) 500 MG chewable tablet Chew 2 tablets by mouth daily as needed for indigestion or heartburn.   Yes [provider]  diphenhydramine-acetaminophen (TYLENOL PM) 25-500 MG TABS tablet Take 1-2 tablets by mouth at bedtime as needed (sleep).    Yes [provider]  OZEMPIC, 0.25 OR 0.5 MG/DOSE, 2 MG/1.5ML SOPN Inject 1 mg into the skin once a week. 05/30/20  Yes Richard Oconnell  pravastatin (PRAVACHOL) 20 MG tablet Take 1 tablet (20 mg total) by mouth daily. 05/30/20  Yes Richard Oconnell  valsartan (DIOVAN) 80 MG tablet Take 1 tablet (80 mg total) by mouth daily. 06/04/20  Yes Richard Oconnell  sildenafil (REVATIO) 20 MG tablet Take 1-5 pills about 30 min prior to sex Patient not taking: No sig reported 12/06/18   [name], Oconnell    Allergies as of 06/05/2020 - Review Complete 06/05/2020  Allergen Reaction Noted  . Cyclobenzaprine Other (See Comments) 09/29/2017  . Lisinopril Cough 09/29/2017    Family History  Problem Relation Age of Onset  .  Heart attack Mother   . Heart disease Mother   . Parkinson's disease Father   . Heart attack Maternal Grandfather   . Heart disease Maternal Grandfather   . Heart attack Maternal Aunt   . Heart attack Maternal Grandmother   . Prostate cancer Neg Hx   . Bladder Cancer Neg Hx   . Kidney cancer Neg Hx      Social History   Socioeconomic History  . Marital status: Divorced    Spouse name: Not on file  . Number of children: Not on file  . Years of education: Not on file  . Highest education level: Not on file  Occupational History  . Not on file  Tobacco Use  . Smoking status: Never Smoker  . Smokeless tobacco: Never Used  . Tobacco comment: once a while had one cigar >10 years ago  Vaping Use  . Vaping Use: Never used  Substance and Sexual Activity  . Alcohol use: No    Alcohol/week: 0.0 standard drinks  . Drug use: No  . Sexual activity: Never  Other Topics Concern  . Not on file  Social History Narrative  . Not on file   Social Determinants of Health   Financial Resource Strain: Not on file  Food Insecurity: Not on file  Transportation Needs: Not on file  Physical Activity: Not on file  Stress: Not on file  Social Connections: Not on file  Intimate Partner Violence: Not on file    Review of Systems: See HPI, otherwise negative ROS  Physical Exam: BP (!) 165/94   Pulse (!) 106   Temp 98.8 F (37.1 C) (Temporal)   Resp 18   Ht 6\' 4"  (1.93 m)   Wt 117.9 kg   SpO2 100%   BMI 31.65 kg/m  General:   Alert,  pleasant and cooperative in NAD Head:  Normocephalic and atraumatic. Neck:  Supple; no masses or thyromegaly. Lungs:  Clear throughout to auscultation, normal respiratory effort.    Heart:  +S1, +S2, Regular rate and rhythm, No edema. Abdomen:  Soft, nontender and nondistended. Normal bowel sounds, without guarding, and without rebound.   Neurologic:  Alert and  oriented x4;  grossly normal neurologically.  Impression/Plan: Richard Oconnell is here for an colonoscopy to be performed for Screening colonoscopy average risk   Risks, benefits, limitations, and alternatives regarding  colonoscopy have been reviewed with the patient.  Questions have been answered.  All parties agreeable.   Richard Bellows, MD  07/03/2020, 9:01 AM

## 2020-07-03 NOTE — Anesthesia Preprocedure Evaluation (Signed)
Anesthesia Evaluation  Patient identified by MRN, date of birth, ID band Patient awake    Reviewed: Allergy & Precautions, H&P , NPO status , Patient's Chart, lab work & pertinent test results  Airway Mallampati: III       Dental  (+) Chipped   Pulmonary neg pulmonary ROS,    Pulmonary exam normal breath sounds clear to auscultation       Cardiovascular hypertension, negative cardio ROS Normal cardiovascular exam Rhythm:Regular Rate:Normal     Neuro/Psych  Neuromuscular disease negative psych ROS   GI/Hepatic negative GI ROS, Neg liver ROS,   Endo/Other  negative endocrine ROSdiabetes  Renal/GU negative Renal ROS  negative genitourinary   Musculoskeletal negative musculoskeletal ROS (+)   Abdominal   Peds negative pediatric ROS (+)  Hematology negative hematology ROS (+)   Anesthesia Other Findings Past Medical History: No date: Cancer [facility])     Comment:  Bladder Cancer 05/26/2016: Erectile dysfunction associated with type 2 diabetes  mellitus ([place]) 11/16/2016: Gross hematuria 06/11/2015: Hypertension 06/11/2015: Type 2 diabetes mellitus with diabetic polyneuropathy,  without long-term current use of insulin (HCC)   Reproductive/Obstetrics negative OB ROS                             Anesthesia Physical Anesthesia Plan  ASA: III  Anesthesia Plan: General   Post-op Pain Management:    Induction: Intravenous  PONV Risk Score and Plan: 2 and Ondansetron and Dexamethasone  Airway Management Planned: Nasal Cannula  Additional Equipment:   Intra-op Plan:   Post-operative Plan: Extubation in OR  Informed Consent: I have reviewed the patients History and Physical, chart, labs and discussed the procedure including the risks, benefits and alternatives for the proposed anesthesia with the patient or authorized representative who has indicated his/her understanding and acceptance.      Dental advisory given  Plan Discussed with: CRNA, Anesthesiologist and Surgeon  Anesthesia Plan Comments:         Anesthesia Quick Evaluation

## 2020-07-03 NOTE — Transfer of Care (Signed)
Immediate Anesthesia Transfer of Care Note  [name]  Procedure(s) Performed: COLONOSCOPY WITH PROPOFOL (N/A )  Patient Location: Endoscopy Unit  Anesthesia Type:General  Level of Consciousness: awake, alert  and oriented  Airway & Oxygen Therapy: Patient Spontanous Breathing and Patient connected to nasal cannula oxygen  Post-op Assessment: Report given to RN and Post -op Vital signs reviewed and stable  Post vital signs: Reviewed and stable  Last Vitals:  Vitals Value Taken Time  BP    Temp    Pulse 95 07/03/20 0922  Resp 20 07/03/20 0922  SpO2 95 % 07/03/20 0922  Vitals shown include unvalidated device data.  Last Pain:  Vitals:   07/03/20 0828  TempSrc: Temporal  PainSc: 0-No pain         Complications: No complications documented.

## 2020-07-03 NOTE — Op Note (Signed)
[facility] Patient Name: Richard Oconnell Procedure Date: 07/03/2020 9:01 AM MRN: [id number] Account #: [id number] Date of Birth: 1955/09/16 Admit Type: Outpatient Age: 65 Room: [facility] ENDO ROOM 3 Gender: Male Note Status: Finalized Procedure:             Colonoscopy Indications:           Screening for colorectal malignant neoplasm Providers:             Jonathon Bellows MD, MD Referring MD:          [name] (Referring MD) Medicines:             Monitored Anesthesia Care Complications:         No immediate complications. Procedure:             Pre-Anesthesia Assessment:                        - Prior to the procedure, a History and Physical was                         performed, and patient medications, allergies and                         sensitivities were reviewed. The patient's tolerance                         of previous anesthesia was reviewed.                        - The risks and benefits of the procedure and the                         sedation options and risks were discussed with the                         patient. All questions were answered and informed                         consent was obtained.                        - ASA Grade Assessment: II - A patient with mild                         systemic disease.                        After obtaining informed consent, the colonoscope was                         passed under direct vision. Throughout the procedure,                         the patient's blood pressure, pulse, and oxygen                         saturations were monitored continuously. The                         Colonoscope was introduced through the anus and  advanced to the the cecum, identified by the                         appendiceal orifice. The colonoscopy was performed                         with ease. The patient tolerated the procedure well.                         The quality  of the bowel preparation was poor. Findings:      The perianal and digital rectal examinations were normal.      Non-bleeding internal hemorrhoids were found during retroflexion. The       hemorrhoids were large and Grade I (internal hemorrhoids that do not       prolapse).      Multiple small-mouthed diverticula were found in the sigmoid colon.      A large amount of semi-liquid stool was found in the transverse colon,       in the ascending colon and in the cecum. Impression:            - Preparation of the colon was poor.                        - Non-bleeding internal hemorrhoids.                        - Diverticulosis in the sigmoid colon.                        - Stool in the transverse colon, in the ascending                         colon and in the cecum.                        - No specimens collected. Recommendation:        - Discharge patient to home (with escort).                        - Resume previous diet.                        - Continue present medications.                        - Repeat colonoscopy in 4 months because the bowel                         preparation was suboptimal.                        - Suggest 2 day prep Procedure Code(s):     --- Professional ---                        [phone], Colonoscopy, flexible; diagnostic, including                         collection of specimen(s) by brushing or washing, when  performed (separate procedure) Diagnosis Code(s):     --- Professional ---                        Z12.11, Encounter for screening for malignant neoplasm                         of colon                        K64.0, First degree hemorrhoids                        K57.30, Diverticulosis of large intestine without                         perforation or abscess without bleeding CPT copyright 2019 American Medical Association. All rights reserved. The codes documented in this report are preliminary and upon coder review may  be  revised to meet current compliance requirements. Jonathon Bellows, MD Jonathon Bellows MD, MD 07/03/2020 9:19:28 AM This report has been signed electronically. Number of Addenda: 0 Note Initiated On: 07/03/2020 9:01 AM Scope Withdrawal Time: 0 hours 3 minutes 4 seconds  Total Procedure Duration: 0 hours 10 minutes 58 seconds  Estimated Blood Loss:  Estimated blood loss: none.      [facility]

## 2020-07-04 ENCOUNTER — Encounter: Payer: Self-pay | Admitting: Gastroenterology

## 2020-07-08 ENCOUNTER — Telehealth: Payer: Self-pay

## 2020-07-08 NOTE — Telephone Encounter (Signed)
He accepted the appt for tomorrow at 3.

## 2020-07-08 NOTE — Telephone Encounter (Signed)
Copied from [place] [id number]. Topic: Quick Communication - See Telephone Encounter >> Jul 08, 2020  8:08 AM Loma [place] wrote: CRM for notification. See Telephone encounter for: 07/08/20. Could not reach office, Please fu with pt if anyway he could be fit in for a boil to be lanced on his back where he can not reach. Pt has been told no appt available till nxt week but we will see if there if something can be worked out and will come him if at all possible.  FU at [phone] PT is  only 7 or 8 min away.

## 2020-07-08 NOTE — Telephone Encounter (Signed)
I have a 3:00pm apt on Wednesday for "new patients" but that is open still, I would offer that apt slot to him  Nobie Putnam, [facility] 07/08/2020, 9:24 AM

## 2020-07-09 ENCOUNTER — Ambulatory Visit (INDEPENDENT_AMBULATORY_CARE_PROVIDER_SITE_OTHER): Payer: Medicare Other | Admitting: Family Medicine

## 2020-07-09 ENCOUNTER — Telehealth: Payer: Self-pay | Admitting: Gastroenterology

## 2020-07-09 ENCOUNTER — Observation Stay
Admission: EM | Admit: 2020-07-09 | Discharge: 2020-07-12 | Disposition: A | Discharge status: Home / Self Care | Payer: Medicare Other | Attending: Internal Medicine | Admitting: Internal Medicine

## 2020-07-09 ENCOUNTER — Encounter: Payer: Self-pay | Admitting: Family Medicine

## 2020-07-09 ENCOUNTER — Emergency Department: Payer: Medicare Other

## 2020-07-09 ENCOUNTER — Other Ambulatory Visit: Payer: Self-pay

## 2020-07-09 VITALS — BP 83/56 | HR 119 | Ht 76.0 in | Wt 255.4 lb

## 2020-07-09 DIAGNOSIS — E8881 Metabolic syndrome: Secondary | ICD-10-CM | POA: Diagnosis present

## 2020-07-09 DIAGNOSIS — L720 Epidermal cyst: Secondary | ICD-10-CM

## 2020-07-09 DIAGNOSIS — E1142 Type 2 diabetes mellitus with diabetic polyneuropathy: Secondary | ICD-10-CM | POA: Diagnosis present

## 2020-07-09 DIAGNOSIS — Z1152 Encounter for screening for COVID-19: Secondary | ICD-10-CM | POA: Insufficient documentation

## 2020-07-09 DIAGNOSIS — N529 Male erectile dysfunction, unspecified: Secondary | ICD-10-CM | POA: Diagnosis present

## 2020-07-09 DIAGNOSIS — Z20822 Contact with and (suspected) exposure to covid-19: Secondary | ICD-10-CM | POA: Diagnosis present

## 2020-07-09 DIAGNOSIS — I4891 Unspecified atrial fibrillation: Secondary | ICD-10-CM | POA: Diagnosis present

## 2020-07-09 DIAGNOSIS — R339 Retention of urine, unspecified: Secondary | ICD-10-CM | POA: Diagnosis not present

## 2020-07-09 DIAGNOSIS — K760 Fatty (change of) liver, not elsewhere classified: Secondary | ICD-10-CM | POA: Diagnosis present

## 2020-07-09 DIAGNOSIS — R1084 Generalized abdominal pain: Secondary | ICD-10-CM

## 2020-07-09 DIAGNOSIS — E6609 Other obesity due to excess calories: Secondary | ICD-10-CM | POA: Diagnosis not present

## 2020-07-09 DIAGNOSIS — Z888 Allergy status to other drugs, medicaments and biological substances status: Secondary | ICD-10-CM | POA: Diagnosis not present

## 2020-07-09 DIAGNOSIS — Z79899 Other long term (current) drug therapy: Secondary | ICD-10-CM

## 2020-07-09 DIAGNOSIS — R140 Abdominal distension (gaseous): Secondary | ICD-10-CM

## 2020-07-09 DIAGNOSIS — K8020 Calculus of gallbladder without cholecystitis without obstruction: Secondary | ICD-10-CM | POA: Diagnosis not present

## 2020-07-09 DIAGNOSIS — K5730 Diverticulosis of large intestine without perforation or abscess without bleeding: Secondary | ICD-10-CM | POA: Diagnosis not present

## 2020-07-09 DIAGNOSIS — A419 Sepsis, unspecified organism: Principal | ICD-10-CM | POA: Insufficient documentation

## 2020-07-09 DIAGNOSIS — Z8551 Personal history of malignant neoplasm of bladder: Secondary | ICD-10-CM | POA: Diagnosis not present

## 2020-07-09 DIAGNOSIS — N170 Acute kidney failure with tubular necrosis: Secondary | ICD-10-CM | POA: Diagnosis present

## 2020-07-09 DIAGNOSIS — K810 Acute cholecystitis: Secondary | ICD-10-CM | POA: Diagnosis present

## 2020-07-09 DIAGNOSIS — E785 Hyperlipidemia, unspecified: Secondary | ICD-10-CM | POA: Diagnosis not present

## 2020-07-09 DIAGNOSIS — Z6833 Body mass index (BMI) 33.0-33.9, adult: Secondary | ICD-10-CM | POA: Diagnosis not present

## 2020-07-09 DIAGNOSIS — N179 Acute kidney failure, unspecified: Secondary | ICD-10-CM | POA: Insufficient documentation

## 2020-07-09 DIAGNOSIS — R000 Tachycardia, unspecified: Secondary | ICD-10-CM

## 2020-07-09 DIAGNOSIS — Z8249 Family history of ischemic heart disease and other diseases of the circulatory system: Secondary | ICD-10-CM | POA: Diagnosis not present

## 2020-07-09 DIAGNOSIS — E876 Hypokalemia: Secondary | ICD-10-CM | POA: Diagnosis not present

## 2020-07-09 DIAGNOSIS — Z794 Long term (current) use of insulin: Secondary | ICD-10-CM | POA: Diagnosis not present

## 2020-07-09 DIAGNOSIS — E119 Type 2 diabetes mellitus without complications: Secondary | ICD-10-CM

## 2020-07-09 DIAGNOSIS — I10 Essential (primary) hypertension: Secondary | ICD-10-CM | POA: Diagnosis not present

## 2020-07-09 DIAGNOSIS — K8000 Calculus of gallbladder with acute cholecystitis without obstruction: Secondary | ICD-10-CM | POA: Diagnosis not present

## 2020-07-09 DIAGNOSIS — K7460 Unspecified cirrhosis of liver: Secondary | ICD-10-CM | POA: Diagnosis present

## 2020-07-09 DIAGNOSIS — E782 Mixed hyperlipidemia: Secondary | ICD-10-CM | POA: Diagnosis present

## 2020-07-09 DIAGNOSIS — R6520 Severe sepsis without septic shock: Secondary | ICD-10-CM | POA: Diagnosis not present

## 2020-07-09 DIAGNOSIS — I959 Hypotension, unspecified: Secondary | ICD-10-CM | POA: Diagnosis not present

## 2020-07-09 DIAGNOSIS — E1169 Type 2 diabetes mellitus with other specified complication: Secondary | ICD-10-CM | POA: Insufficient documentation

## 2020-07-09 DIAGNOSIS — E860 Dehydration: Secondary | ICD-10-CM

## 2020-07-09 DIAGNOSIS — K819 Cholecystitis, unspecified: Secondary | ICD-10-CM

## 2020-07-09 LAB — COMPREHENSIVE METABOLIC PANEL
ALT: 52 U/L — ABNORMAL HIGH (ref 0–44)
AST: 48 U/L — ABNORMAL HIGH (ref 15–41)
Albumin: 3.9 g/dL (ref 3.5–5.0)
Alkaline Phosphatase: 88 U/L (ref 38–126)
Anion gap: 15 (ref 5–15)
BUN: 39 mg/dL — ABNORMAL HIGH (ref 8–23)
CO2: 18 mmol/L — ABNORMAL LOW (ref 22–32)
Calcium: 9.5 mg/dL (ref 8.9–10.3)
Chloride: 102 mmol/L (ref 98–111)
Creatinine, Ser: 3.12 mg/dL — ABNORMAL HIGH (ref 0.61–1.24)
GFR, Estimated: 21 mL/min — ABNORMAL LOW (ref >60)
Glucose, Bld: 207 mg/dL — ABNORMAL HIGH (ref 70–99)
Potassium: 3.9 mmol/L (ref 3.5–5.1)
Sodium: 135 mmol/L (ref 135–145)
Total Bilirubin: 2 mg/dL — ABNORMAL HIGH (ref 0.3–1.2)
Total Protein: 8.3 g/dL — ABNORMAL HIGH (ref 6.5–8.1)

## 2020-07-09 LAB — CBC
HCT: 52 % (ref 39.0–52.0)
Hemoglobin: 18 g/dL — ABNORMAL HIGH (ref 13.0–17.0)
MCH: 29.9 pg (ref 26.0–34.0)
MCHC: 34.6 g/dL (ref 30.0–36.0)
MCV: 86.4 fL (ref 80.0–100.0)
Platelets: 310 10*3/uL (ref 150–400)
RBC: 6.02 MIL/uL — ABNORMAL HIGH (ref 4.22–5.81)
RDW: 12.8 % (ref 11.5–15.5)
WBC: 27 10*3/uL — ABNORMAL HIGH (ref 4.0–10.5)
nRBC: 0 % (ref 0.0–0.2)

## 2020-07-09 LAB — LIPASE, BLOOD: Lipase: 24 U/L (ref 11–51)

## 2020-07-09 LAB — LACTIC ACID, PLASMA
Lactic Acid, Venous: 2.4 mmol/L (ref 0.5–1.9)
Lactic Acid, Venous: 2.7 mmol/L (ref 0.5–1.9)

## 2020-07-09 LAB — MAGNESIUM: Magnesium: 2 mg/dL (ref 1.7–2.4)

## 2020-07-09 LAB — CBG MONITORING, ED: Glucose-Capillary: 139 mg/dL — ABNORMAL HIGH (ref 70–99)

## 2020-07-09 MED ORDER — INSULIN ASPART 100 UNIT/ML ~~LOC~~ SOLN: 0.0000 [IU] | Freq: Every day | SUBCUTANEOUS | Status: DC

## 2020-07-09 MED ORDER — SODIUM CHLORIDE 0.9 % IV SOLN
2.0000 g | Freq: Once | INTRAVENOUS | Status: AC
  Administered 2020-07-09: 2 g via INTRAVENOUS
  Filled 2020-07-09: qty 2

## 2020-07-09 MED ORDER — ONDANSETRON HCL 4 MG/2ML IJ SOLN
4.0000 mg | Freq: Four times a day (QID) | INTRAMUSCULAR | Status: DC | PRN
  Administered 2020-07-09: 4 mg via INTRAVENOUS
  Filled 2020-07-09: qty 2

## 2020-07-09 MED ORDER — SODIUM CHLORIDE 0.9 % IV SOLN
2.0000 g | Freq: Two times a day (BID) | INTRAVENOUS | Status: DC
  Administered 2020-07-10 – 2020-07-11 (×4): 2 g via INTRAVENOUS
  Filled 2020-07-09 (×6): qty 2

## 2020-07-09 MED ORDER — MORPHINE SULFATE (PF) 2 MG/ML IV SOLN
2.0000 mg | INTRAVENOUS | Status: DC | PRN
  Administered 2020-07-09 – 2020-07-10 (×5): 2 mg via INTRAVENOUS
  Filled 2020-07-09 (×5): qty 1

## 2020-07-09 MED ORDER — ACETAMINOPHEN 325 MG PO TABS
325.0000 mg | ORAL_TABLET | Freq: Four times a day (QID) | ORAL | Status: DC | PRN
  Administered 2020-07-09: 325 mg via ORAL
  Filled 2020-07-09: qty 1

## 2020-07-09 MED ORDER — METRONIDAZOLE IN NACL 5-0.79 MG/ML-% IV SOLN
500.0000 mg | Freq: Three times a day (TID) | INTRAVENOUS | Status: DC
  Administered 2020-07-10 – 2020-07-12 (×8): 500 mg via INTRAVENOUS
  Filled 2020-07-09 (×10): qty 100

## 2020-07-09 MED ORDER — ONDANSETRON HCL 4 MG/2ML IJ SOLN
4.0000 mg | INTRAMUSCULAR | Status: AC
  Administered 2020-07-09: 4 mg via INTRAVENOUS
  Filled 2020-07-09: qty 2

## 2020-07-09 MED ORDER — ONDANSETRON HCL 4 MG PO TABS: 4.0000 mg | ORAL_TABLET | Freq: Four times a day (QID) | ORAL | Status: DC | PRN

## 2020-07-09 MED ORDER — ACETAMINOPHEN 650 MG RE SUPP: 325.0000 mg | Freq: Four times a day (QID) | RECTAL | Status: DC | PRN

## 2020-07-09 MED ORDER — LACTATED RINGERS IV SOLN: INTRAVENOUS | Status: DC

## 2020-07-09 MED ORDER — FLUTICASONE PROPIONATE 50 MCG/ACT NA SUSP
2.0000 | Freq: Every day | NASAL | Status: DC
  Administered 2020-07-10 – 2020-07-12 (×4): 2 via NASAL
  Filled 2020-07-09: qty 16

## 2020-07-09 MED ORDER — FENTANYL CITRATE (PF) 100 MCG/2ML IJ SOLN
75.0000 ug | Freq: Once | INTRAMUSCULAR | Status: AC
Start: 2020-07-09 — End: 2020-07-09
  Administered 2020-07-09: 75 ug via INTRAVENOUS
  Filled 2020-07-09: qty 2

## 2020-07-09 MED ORDER — METRONIDAZOLE IN NACL 5-0.79 MG/ML-% IV SOLN
500.0000 mg | Freq: Once | INTRAVENOUS | Status: AC
  Administered 2020-07-09: 500 mg via INTRAVENOUS
  Filled 2020-07-09: qty 100

## 2020-07-09 MED ORDER — INSULIN ASPART 100 UNIT/ML ~~LOC~~ SOLN
0.0000 [IU] | Freq: Three times a day (TID) | SUBCUTANEOUS | Status: DC
  Administered 2020-07-10: 2 [IU] via SUBCUTANEOUS
  Filled 2020-07-09: qty 1

## 2020-07-09 MED ORDER — LACTATED RINGERS IV BOLUS (SEPSIS)
3000.0000 mL | Freq: Once | INTRAVENOUS | Status: AC
  Administered 2020-07-09: 3000 mL via INTRAVENOUS

## 2020-07-09 NOTE — Progress Notes (Signed)
Subjective:    [name], male    DOB: 12-13-1955, 65 y.o.   MRN: [id number]  Richard Oconnell is a 65 y.o. male presenting on 07/09/2020 for Abdominal Pain    HPI   Patient here for initial plan of Abscess I&D procedure today 2 locations on his back.  ACUTE ABDOMINAL PAIN DISTENTION / BLOATING DYSPNEA HYPOTENSION DEHYDRATION Poor PO INTAKE  Here today reports that he was more worried about his abdominal pain. But he still asked to have cyst drained. He has lanced in past and they have returned.  He had routine screening colonoscopy on 07/03/20 Dr Vicente Males at [place]. Report showed that it had retained stool and poor bowel prep. No specimens collected. He had hemorrhoids on exam.  He reports acute and persistent moderate to severe general abdominal pain and bloating since the procedure. He has not been able to take any regular PO intake solid or much liquid since Thursday date of procedure.  He has taken Pepto bismol regularly, limited results.  He has had some bowel movement he reports but mostly just "black stool" he says due to the pepto bismol. He denies any bleeding from rectum or blood in stool.  He feels winded and short of breath. Worse with walking.  His blood pressure is low today. He has been still taking his regular medications.  He thought about ED but wanted to come here for evaluation first.  He has called his GI office waiting on a call back.   Depression screen [facility] 2/9 05/30/2020 03/09/2019 12/06/2018  Decreased Interest 0 0 0  Down, Depressed, Hopeless 0 0 0  PHQ - 2 Score 0 0 0    Social History   Tobacco Use  . Smoking status: Never Smoker  . Smokeless tobacco: Never Used  . Tobacco comment: once a while had one cigar >10 years ago  Vaping Use  . Vaping Use: Never used  Substance Use Topics  . Alcohol use: No    Alcohol/week: 0.0 standard drinks  . Drug use: No    Review of Systems Per HPI unless specifically  indicated above     Objective:    BP (!) 83/56   Pulse (!) 119   Ht 6\' 4"  (1.93 m)   Wt 255 lb 6.4 oz (115.8 kg)   SpO2 94%   BMI 31.09 kg/m   Wt Readings from Last 3 Encounters:  07/09/20 255 lb 6.4 oz (115.8 kg)  07/03/20 260 lb (117.9 kg)  05/30/20 270 lb 3.2 oz (122.6 kg)    Physical Exam Vitals and nursing note reviewed.  Constitutional:      General: He is not in acute distress.    Appearance: He is well-developed and well-nourished. He is not diaphoretic.     Comments: Currently ill appearing with discomfort with breathing and abdomen. Cooperative.  HENT:     Head: Normocephalic and atraumatic.     Mouth/Throat:     Mouth: Oropharynx is clear and moist.  Eyes:     General:        Right eye: No discharge.        Left eye: No discharge.     Conjunctiva/sclera: Conjunctivae normal.  Neck:     Thyroid: No thyromegaly.  Cardiovascular:     Rate and Rhythm: Normal rate and regular rhythm.     Pulses: Intact distal pulses.     Heart sounds: Normal heart sounds. No murmur heard.   Pulmonary:  Effort: Pulmonary effort is normal. No respiratory distress.     Breath sounds: Normal breath sounds. No wheezing or rales.  Abdominal:     General: Abdomen is protuberant. Bowel sounds are decreased. There is distension.     Palpations: Abdomen is soft.     Tenderness: There is generalized abdominal tenderness and tenderness in the right upper quadrant, epigastric area, periumbilical area and left lower quadrant. There is no right CVA tenderness, left CVA tenderness, guarding or rebound. Negative signs include Murphy's sign.     Hernia: No hernia is present.  Musculoskeletal:        General: No edema. Normal range of motion.     Cervical back: Normal range of motion and neck supple.  Lymphadenopathy:     Cervical: No cervical adenopathy.  Skin:    General: Skin is warm and dry.     Findings: No erythema or rash.     Comments: X 2 focal epidermoid cyst on back upper and  R side, 2 x 2 cm each approx soft, deeper tissue, no erythema, no ulceration, no sign of abscess, non tender. No induration.  Neurological:     Mental Status: He is alert and oriented to person, place, and time.  Psychiatric:        Mood and Affect: Mood and affect normal.        Behavior: Behavior normal.     Comments: Well groomed, good eye contact, normal speech and thoughts       Results for orders placed or performed during the hospital encounter of 07/03/20  Glucose, capillary  Result Value Ref Range   Glucose-Capillary 159 (H) 70 - 99 mg/dL      Assessment & Plan:   Problem List Items Addressed This Visit   None   Visit Diagnoses    Generalized abdominal pain    -  Primary   Abdominal distension       Epidermoid cyst of skin of back       Tachycardia       Dehydration          Clinically patient is very ill appearing today and concerning vital signs with very limited PO intake in past 5-6 days after colonoscopy. - No regular PO intake or meal - Appears dehydrated on exam, he is tachycardic, and hypotensive. He already took meds today. Dark BM only, from pepto bismol is all he is taking. Not admitting to any blood or bleeding.  Unable to reach GI, he was waiting on call back. Our office called [facility] and left message spoke with triage  Patient was advised to go directly to [facility] ED now, he was offered ambulance but declines, he feels comfortable enough to drive. He drove to this appointment.  Advised he would likely need immediate imaging, and IVF rehydration and further management.  At risk of a bowel obstruction or other complication.  Advised that we should DEFER the elective I&D or lance of the epidermoid cysts, honestly should be referred to Gen Surgery to excise the whole cyst. For better result. Due to size of them and multiple locations.  No sign of abscess or infection.  I called [facility] ED spoke with triage, provided history, they will  anticipate patient immediately upon arrival.  No orders of the defined types were placed in this encounter.     Follow up plan: No follow-ups on file.  Follow up PRN. Return to [place], [facility] 07/09/2020, 3:16 PM

## 2020-07-09 NOTE — Consult Note (Signed)
Pharmacy Antibiotic Note  Richard Oconnell is a 65 y.o. male with medical history including diabetes, bladder cancer admitted on 07/09/2020 with acute cholecystitis. Per chart review, patient reports recent colonoscopy after which he began experiencing abdominal pain. Imaging consistent with acute cholecystitis. Pharmacy has been consulted for cefepime dosing. Patient is also ordered metronidazole.   Labs on admission significant for Scr 3.12 (baseline ~1). WBC 27.  Plan: Cefepime 2 g IV q12h (renally adjusted) --CrCl = 32.9 mL/min --Normalized CrCl = 24 mL/min  Continue to monitor renal function and adjust antibiotic dosing as indicated.   Height: 6\' 4"  (193 cm) Weight: 115.8 kg (255 lb 6.4 oz) IBW/kg (Calculated) : 86.8  Temp (24hrs), Avg:97.6 F (36.4 C), Min:97.6 F (36.4 C), Max:97.6 F (36.4 C)  Recent Labs  Lab 07/09/20 1600 07/09/20 1653  WBC 27.0*  --   CREATININE 3.12*  --   LATICACIDVEN  --  2.4*    Estimated Creatinine Clearance: 32.9 mL/min (A) (by C-G formula based on SCr of 3.12 mg/dL (H)).    Allergies  Allergen Reactions  . Cyclobenzaprine Other (See Comments)    Not improving pain and makes him agressive.  . Lisinopril Cough    Antimicrobials this admission: Cefepime 2/23 >>  Metronidazole 2/23 >>   Dose adjustments this admission: N/A  Microbiology results: 2/23 BCx: pending  Thank you for allowing pharmacy to be a part of this patient's care.  Benita Gutter 07/09/2020 6:37 PM

## 2020-07-09 NOTE — ED Notes (Signed)
EDP performing bedside abdominal ultrasound. Pt is tender to light palpation in RUQ.

## 2020-07-09 NOTE — Consult Note (Signed)
PHARMACY -  BRIEF ANTIBIOTIC NOTE   Pharmacy has received consult(s) for cefepime from an ED provider.  The patient's profile has been reviewed for ht/wt/allergies/indication/available labs.    One time order(s) placed for  --cefepime 2 gram  Further antibiotics/pharmacy consults should be ordered by admitting physician if indicated.                       Thank you,  Dorothe Pea, PharmD, BCPS Clinical Pharmacist  07/09/2020  4:51 PM

## 2020-07-09 NOTE — Progress Notes (Signed)
Briefly, this is a 65 y/o M with a number of medical comorbidities who developed abdominal pain after undergoing colonoscopy on 07/03/20.  He presented to his PCP office today, worried about the pain. He was found to be hypotensive and appeared ill. Poor PO intake for several days. His PCP sent him to the ED for further evaluation and management.  In the ED, he was found to be in new-onset a fib with RVR (HR 150's), hypotensive, and with leukocytosis (27K), mild lactic acidosis (2.4), and AKI (Cr 3.12, with prior baseline around 1.1). His CBC does appear highly hemoconcentrated, with a H/H of 18/52.  CT scan of the abdomen was consistent with acute cholecystitis.  Of note, he also has an elevated bilirubin of 2.0. He is diabetic, obese, and also has a history of bladder cancer.  Due to his significant medical comorbidities in the face of sepsis, recommend admission to hospital medicine.  IVF resuscitation, IV abx.  Consider cardiology consultation to address a fib (likely secondary to sepsis and dehydration).  Will need to be better medically optimized before surgical intervention is feasible and he may require percutaneous cholecystostomy tube as a temporizing measure.  Full consult note to follow in the AM.

## 2020-07-09 NOTE — Consult Note (Signed)
CODE SEPSIS - PHARMACY COMMUNICATION  **Broad Spectrum Antibiotics should be administered within 1 hour of Sepsis diagnosis**  Time Code Sepsis Called/Page Received: 1646  Antibiotics Ordered: cefepime, metronidazole   Time of 1st antibiotic administration: 1713  Additional action taken by pharmacy: n/a  If necessary, Name of Provider/Nurse Contacted: [place] ,PharmD,BCPS Clinical Pharmacist  07/09/2020  4:50 PM

## 2020-07-09 NOTE — ED Triage Notes (Signed)
Pt to ER via POV with complaints of RUQ pain.  Pt reports having a colonoscopy on Thursday and since then has had generalized abdominal pain, most significant in the RUQ. Pt reports extreme fatigue and has been sleeping all day, last bowel movement today, passing gas, abdominal non-tender on palpation. No urinary symptoms. Reports very poor PO intake since Thursday.

## 2020-07-09 NOTE — ED Notes (Signed)
Pt to CT

## 2020-07-09 NOTE — ED Notes (Signed)
See ED triage note. EDP at bedside. Pt in NAD at this time but HR is 149 and pt rates RUQ abdominal pain as 7/10.

## 2020-07-09 NOTE — ED Notes (Signed)
Scanner in room not working. CN examined scanner. Now using hall computer so can scan meds.

## 2020-07-09 NOTE — Telephone Encounter (Signed)
Patient is still have abd pain (0-10 is a 6) since after procedure taking peptobismal. What can he do?

## 2020-07-09 NOTE — Sepsis Progress Note (Signed)
Sepsis protocol being followed by [facility] 

## 2020-07-09 NOTE — ED Provider Notes (Signed)
[facility] Emergency Department Provider Note   ____________________________________________   Event Date/Time   First MD Initiated Contact with Patient 07/09/20 1629     (approximate)  I have reviewed the triage vital signs and the nursing notes.   HISTORY  Chief Complaint Abdominal Pain    HPI Richard Oconnell is a 65 y.o. male reports the day after colonoscopy started having abdominal pain feeling his belly was little sore distended.  Then the last few days has had increasing pain becoming quite severe located primarily the right upper abdomen.  No chest pain or trouble breathing.  Last bowel movement was today at 3:15 PM and looked a little dark but attributes this to use of Pepto-Bismol.   Pain increasing over several days.  Located primarily in the right upper abdomen.  No fevers or chills.  Has noticed pain gets worse when he eats.  Has not been hardly eating anything last few days as it makes his pain worse.  History of bladder cancer in the past  Past Medical History:  Diagnosis Date  . Cancer [facility])    Bladder Cancer  . Erectile dysfunction associated with type 2 diabetes mellitus ([place]) 05/26/2016  . Gross hematuria 11/16/2016  . Hypertension 06/11/2015  . Type 2 diabetes mellitus with diabetic polyneuropathy, without long-term current use of insulin ([place]) 06/11/2015    Patient Active Problem List   Diagnosis Date Noted  . Sepsis associated hypotension ([place]) 07/09/2020  . Class 1 obesity due to excess calories with serious comorbidity and body mass index (BMI) of 33.0 to 33.9 in adult 07/06/2019  . Malignant neoplasm of urinary bladder ([place]) 12/06/2018  . Overweight (BMI 25.0-29.9) 07/04/2018  . Hyperlipidemia associated with type 2 diabetes mellitus ([place]) 11/30/2016  . 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 Gross hematuria 11/16/2016  . Erectile dysfunction associated with type 2 diabetes mellitus ([place]) 05/26/2016  . Diabetes mellitus with  coincident hypertension ([place]) 06/11/2015  . Type 2 diabetes mellitus with diabetic polyneuropathy, without long-term current use of insulin ([place]) 06/11/2015    Past Surgical History:  Procedure Laterality Date  . boil lanceted     hand  . COLONOSCOPY WITH PROPOFOL N/A 07/03/2020   Procedure: COLONOSCOPY WITH PROPOFOL;  Surgeon: Jonathon Bellows, MD;  Location: [facility] ENDOSCOPY;  Service: Gastroenterology;  Laterality: N/A;  . CYSTOSCOPY W/ RETROGRADES Bilateral 12/06/2016   Procedure: CYSTOSCOPY WITH RETROGRADE PYELOGRAM;  Surgeon: Hollice Espy, MD;  Location: [facility] ORS;  Service: Urology;  Laterality: Bilateral;  . CYSTOSCOPY W/ RETROGRADES Bilateral 06/27/2017   Procedure: CYSTOSCOPY WITH RETROGRADE PYELOGRAM;  Surgeon: Hollice Espy, MD;  Location: [facility] ORS;  Service: Urology;  Laterality: Bilateral;  . CYSTOSCOPY WITH BIOPSY N/A 06/27/2017   Procedure: CYSTOSCOPY WITH Bladder BIOPSY;  Surgeon: Hollice Espy, MD;  Location: [facility] ORS;  Service: Urology;  Laterality: N/A;  . CYSTOSCOPY WITH BIOPSY N/A 10/24/2017   Procedure: CYSTOSCOPY WITH Bladder BIOPSY;  Surgeon: Hollice Espy, MD;  Location: [facility] ORS;  Service: Urology;  Laterality: N/A;  . ingrown  Bilateral    ingrown toenail  . TRANSURETHRAL RESECTION OF BLADDER TUMOR N/A 12/06/2016   Procedure: TRANSURETHRAL RESECTION OF BLADDER TUMOR (TURBT) (2-5cm) CLOT EVACUATION;  Surgeon: Hollice Espy, MD;  Location: [facility] ORS;  Service: Urology;  Laterality: N/A;  . TRANSURETHRAL RESECTION OF BLADDER TUMOR N/A 06/27/2017   Procedure: TRANSURETHRAL RESECTION OF BLADDER TUMOR (TURBT);  Surgeon: Hollice Espy, MD;  Location: [facility] ORS;  Service: Urology;  Laterality: N/A;  . TRANSURETHRAL RESECTION OF BLADDER TUMOR  WITH MITOMYCIN-C N/A 01/18/2017   Procedure: TRANSURETHRAL RESECTION OF BLADDER TUMOR WITH MITOMYCIN-C-(SMALL);  Surgeon: Hollice Espy, MD;  Location: [facility] ORS;  Service: Urology;  Laterality: N/A;    Prior to Admission medications    Medication Sig Start Date End Date Taking? Authorizing Provider  acetaminophen (TYLENOL) 500 MG tablet Take 500-1,000 mg by mouth every 8 (eight) hours as needed for mild pain.    [provider]  calcium carbonate (TUMS - DOSED IN MG ELEMENTAL CALCIUM) 500 MG chewable tablet Chew 2 tablets by mouth daily as needed for indigestion or heartburn.    [provider]  diphenhydramine-acetaminophen (TYLENOL PM) 25-500 MG TABS tablet Take 1-2 tablets by mouth at bedtime as needed (sleep).     [provider]  OZEMPIC, 0.25 OR 0.5 MG/DOSE, 2 MG/1.5ML SOPN Inject 1 mg into the skin once a week. 05/30/20   Karamalegos, Devonne Doughty, DO  pravastatin (PRAVACHOL) 20 MG tablet Take 1 tablet (20 mg total) by mouth daily. 05/30/20   Karamalegos, Devonne Doughty, DO  sildenafil (REVATIO) 20 MG tablet Take 1-5 pills about 30 min prior to sex Patient not taking: No sig reported 12/06/18   [name], DO  valsartan (DIOVAN) 80 MG tablet Take 1 tablet (80 mg total) by mouth daily. 06/04/20   Karamalegos, Devonne Doughty, DO    Allergies Cyclobenzaprine and Lisinopril  Family History  Problem Relation Age of Onset  . Heart attack Mother   . Heart disease Mother   . Parkinson's disease Father   . Heart attack Maternal Grandfather   . Heart disease Maternal Grandfather   . Heart attack Maternal Aunt   . Heart attack Maternal Grandmother   . Prostate cancer Neg Hx   . Bladder Cancer Neg Hx   . Kidney cancer Neg Hx     Social History Social History   Tobacco Use  . Smoking status: Never Smoker  . Smokeless tobacco: Never Used  . Tobacco comment: once a while had one cigar >10 years ago  Vaping Use  . Vaping Use: Never used  Substance Use Topics  . Alcohol use: No    Alcohol/week: 0.0 standard drinks  . Drug use: No    Review of Systems Constitutional: No fever/chills has been fatigue feeling dehydrated Eyes: No visual changes. ENT: No sore throat. Cardiovascular:  Denies chest pain. Respiratory: Denies shortness of breath. Gastrointestinal: Reports not much appetite when he eats or drinks something he starts getting worsening of pain in his right upper abdomen Genitourinary: Negative for dysuria.  Urinating a little less Musculoskeletal: Negative for back pain. Skin: Negative for rash. Neurological: Negative for headaches, areas of focal weakness or numbness.    ____________________________________________   PHYSICAL EXAM:  VITAL SIGNS: ED Triage Vitals  Enc Vitals Group     BP 07/09/20 1558 93/69     Pulse Rate 07/09/20 1558 78     Resp 07/09/20 1558 15     Temp 07/09/20 1558 97.6 F (36.4 C)     Temp Source 07/09/20 1558 Oral     SpO2 07/09/20 1558 94 %     Weight 07/09/20 1559 255 lb 6.4 oz (115.8 kg)     Height 07/09/20 1559 6\' 4"  (1.93 m)     Head Circumference --      Peak Flow --      Pain Score 07/09/20 1558 7     Pain Loc --      Pain Edu? --  Excl. in [place]? --     Constitutional: Alert and oriented. Well appearing and in no acute distress. Eyes: Conjunctivae are normal. Head: Atraumatic. Nose: No congestion/rhinnorhea. Mouth/Throat: Mucous membranes are moist. Neck: No stridor.  Cardiovascular: Moderately tachycardic, irregular.  Grossly normal heart sounds.  Good peripheral circulation. Respiratory: Normal respiratory effort.  No retractions. Lungs CTAB. Gastrointestinal: Exquisite tender in the right upper quadrant.  Minimal tenderness elsewhere, musculoskeletal: No lower extremity tenderness nor edema. Neurologic:  Normal speech and language. No gross focal neurologic deficits are appreciated.  Skin:  Skin is warm, dry and intact. No rash noted. Psychiatric: Mood and affect are normal. Speech and behavior are normal.  ____________________________________________   LABS (all labs ordered are listed, but only abnormal results are displayed)  Labs Reviewed  COMPREHENSIVE METABOLIC PANEL - Abnormal; Notable for  the following components:      Result Value   CO2 18 (*)    Glucose, Bld 207 (*)    BUN 39 (*)    Creatinine, Ser 3.12 (*)    Total Protein 8.3 (*)    AST 48 (*)    ALT 52 (*)    Total Bilirubin 2.0 (*)    GFR, Estimated 21 (*)    All other components within normal limits  CBC - Abnormal; Notable for the following components:   WBC 27.0 (*)    RBC 6.02 (*)    Hemoglobin 18.0 (*)    All other components within normal limits  LACTIC ACID, PLASMA - Abnormal; Notable for the following components:   Lactic Acid, Venous 2.4 (*)    All other components within normal limits  CULTURE, BLOOD (ROUTINE X 2)  CULTURE, BLOOD (ROUTINE X 2)  LIPASE, BLOOD  URINALYSIS, COMPLETE (UACMP) WITH MICROSCOPIC  LACTIC ACID, PLASMA   ____________________________________________  EKG  Reviewed inter by me at 1600 Heart rate 150 QRS 100 QTc 460 Atrial fibrillation with rapid ventricular response, nonspecific T wave abnormality.  No evidence of acute ischemia ____________________________________________  RADIOLOGY  CT ABDOMEN PELVIS WO CONTRAST  Result Date: 07/09/2020 CLINICAL DATA:  65 year old male with abdominal pain. EXAM: CT ABDOMEN AND PELVIS WITHOUT CONTRAST TECHNIQUE: Multidetector CT imaging of the abdomen and pelvis was performed following the standard protocol without IV contrast. COMPARISON:  CT abdomen pelvis dated 11/23/2016. FINDINGS: Evaluation of this exam is limited in the absence of intravenous contrast. Lower chest: The visualized lung bases are clear. No intra-abdominal free air.  Small free fluid in the pelvis. Hepatobiliary: There is severe fatty liver. Slight irregularity of the liver contour likely early changes of cirrhosis. Clinical correlation is recommended. No intrahepatic biliary dilatation. The gallbladder is distended. There is layering sludge and small stones within the gallbladder. There is diffuse gallbladder wall edema with pericholecystic fluid most consistent with  acute cholecystitis. Further evaluation with right upper quadrant ultrasound recommended. Pancreas: Unremarkable. No pancreatic ductal dilatation or surrounding inflammatory changes. Spleen: Normal in size without focal abnormality. Adrenals/Urinary Tract: The adrenal glands unremarkable. Bilateral renal cortex irregularity. Indeterminate 2.2 cm hypodense lesion from the lateral upper pole of the left kidney demonstrating fluid attenuation, likely cysts. This was present on the prior CT. There is no hydronephrosis or nephrolithiasis on either side. The visualized ureters appear unremarkable. There is diffuse trabeculated appearance of the bladder wall likely related to chronic bladder outlet obstruction. There is a 2 mm stone along the bladder base similar to prior CT, likely a focal calcification of the bladder wall or associated with prostate. Stomach/Bowel: There is  sigmoid diverticulosis without active inflammatory changes. There is loose stool throughout the colon compatible with diarrheal state. There is no bowel obstruction. Inflammatory changes of the proximal transverse colon, likely reactive to inflammation of the gallbladder. The appendix is normal. Vascular/Lymphatic: Mild aortoiliac atherosclerotic disease. The IVC is unremarkable. No portal venous gas. There is no adenopathy. Reproductive: The prostate and seminal vesicles are grossly unremarkable. No pelvic masses Other: None Musculoskeletal: Degenerative changes of the spine. No acute osseous pathology. IMPRESSION: 1. Acute cholecystitis. Further evaluation with right upper quadrant ultrasound recommended. 2. Severe fatty liver with findings of early cirrhosis. Clinical correlation is recommended. 3. Sigmoid diverticulosis. No bowel obstruction. Normal appendix. 4. Aortic Atherosclerosis (ICD10-I70.0). Electronically Signed   By: Anner Crete M.D.   On: 07/09/2020 17:24    Imaging reviewed personally reviewed and discussed with Dr. Celine Ahr.   Concerning for acute cholecystitis.  Sternal findings are also noted. ____________________________________________   PROCEDURES  Procedure(s) performed: None  Procedures  Critical Care performed: Yes, see critical care note(s)  CRITICAL CARE Performed by: Delman Kitten   Total critical care time: 35 minutes  Critical care time was exclusive of separately billable procedures and treating other patients.  Critical care was necessary to treat or prevent imminent or life-threatening deterioration.  Critical care was time spent personally by me on the following activities: development of treatment plan with patient and/or surrogate as well as nursing, discussions with consultants, evaluation of patient's response to treatment, examination of patient, obtaining history from patient or surrogate, ordering and performing treatments and interventions, ordering and review of laboratory studies, ordering and review of radiographic studies, pulse oximetry and re-evaluation of patient's condition.  ____________________________________________   INITIAL IMPRESSION / ASSESSMENT AND PLAN / ED COURSE  Pertinent labs & imaging results that were available during my care of the patient were reviewed by me and considered in my medical decision making (see chart for details).   Differential diagnosis includes but is not limited to, abdominal perforation, aortic dissection, cholecystitis, appendicitis, diverticulitis, colitis, esophagitis/gastritis, kidney stone, pyelonephritis, urinary tract infection, aortic aneurysm. All are considered in decision and treatment plan. Based upon the patient's presentation and risk factors, based on the location of pain I am very concerned about acute cholecystitis or acute hepatobiliary pathology.  He is not febrile but has significant leukocytosis new onset atrial fibrillation  Imaging reviewed, revealing acute cholecystitis which seems in keeping with the clinical  history.  Thankfully no perforation  Start broad-spectrum antibiotics, fluid resuscitation for sepsis with evidence of endorgan dysfunction.  Severe sepsis.  Patient will be admitted to the hospitalist team Dr. Tobie Poet, and Dr. [name] providing consultation from surgery perspective  Patient understand agreeable plan for admission.  Pain is partially relieved by fentanyl       ____________________________________________   FINAL CLINICAL IMPRESSION(S) / ED DIAGNOSES  Final diagnoses:  Acute cholecystitis  Atrial fibrillation, rapid (HCC)  Severe sepsis (HCC)  AKI (acute kidney injury) ([place])        Note:  This document was prepared using Dragon voice recognition software and may include unintentional dictation errors       Delman Kitten, MD 07/09/20 1818

## 2020-07-09 NOTE — H&P (Signed)
History and Physical   Richard Oconnell EXH:[id number] DOB: Jan 02, 1956 DOA: 07/09/2020  PCP: [name], DO  Outpatient Specialists: Dr. Jonathon Bellows, GI Patient coming from: Home  I have personally briefly reviewed patient's old medical records in [facility].  Chief Concern: Abdominal pain  HPI: Richard Oconnell is a 65 y.o. male with medical history significant for hypertension, truncal obesity, non-insulin-dependent diabetes mellitus, hyperlipidemia, metabolic syndrome, presents emergency department for chief concerns of abdominal pain.  He reports the abdominal distention and pain that started on Sunday, 07/06/2020.  He reports the abdominal pain is sharp, initially 9/10 now 7/10 after fentanyl.  He endorses poor PO intake due to no appetite since Thursday, 07/03/2025.he endorses nausea since Monday and Tuesday and denies vomiting. He is currently not nauseous at this time.  Social history: Patient is divorced, has no children, parents are deceased.  He has a brother and sister who would be his legal next of kin.  He denies tobacco use, EtOH, recreational drug use.  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  Vaccination: He is status post 2 doses of Moderna, 11/30/2019 and 12/28/2019  ROS: Constitutional: no weight change, no fever ENT/Mouth: no sore throat, no rhinorrhea Eyes: no eye pain, no vision changes Cardiovascular: no chest pain, no dyspnea,  no edema, no palpitations Respiratory: no cough, no sputum, no wheezing Gastrointestinal: + nausea, no vomiting, no diarrhea, no constipation Genitourinary: no urinary incontinence, no dysuria, no hematuria, + decreased urination Musculoskeletal: no arthralgias, no myalgias Skin: no skin lesions, no pruritus, Neuro: + weakness, no loss of consciousness, no syncope Psych: no anxiety, no depression, + decrease appetite Heme/Lymph:  no bruising, no bleeding  ED Course: Discussed with ED provider, patient requiring hospitalization due to sepsis secondary to cholecystitis.  Vitals in the ED was afebrile with temperatures of 97.6, respiration rate of 18, heart rate of 72 to 119, blood pressure 97/74.  Initial blood pressure was 83/56, patient was given sepsis bolus and maintained on LR 150 ml/h.  ED provider initiated antibiotics with cefepime and metronidazole.  ED provider called general surgeon, Dr. Celine Ahr, who recommends n.p.o., broad-spectrum antibiotics treat for sepsis, and interventional radiology consultation for drain placement and possible surgery tomorrow pending improvement from sepsis.  Assessment/Plan  Principal Problem:   Sepsis associated hypotension (HCC) Active Problems:   Diabetes mellitus with coincident hypertension (HCC)   Erectile dysfunction associated with type 2 diabetes mellitus ([place])   Hyperlipidemia associated with type 2 diabetes mellitus (HCC)   Class 1 obesity due to excess calories with serious comorbidity and body mass index (BMI) of 33.0 to 33.9 in adult   Metabolic syndrome   Acute cholecystitis   # Severe sepsis with hypotension likely secondary to cholecystitis -Renal involvement, initial lactic acid 2.4, WBC 27, hypotension, A. fib with RVR -Status post LR 3 L bolus, metronidazole 500 mg IV once cefepime 2 g IV once per ED provider -Continue broad-spectrum antibiotic with cefepime IV and metronidazole IV -Continue IVF LR 150 mL/h -General surgery, Dr. Celine Ahr has been consulted and is aware of patient -N.p.o. -IR biliary drain placement with cholangiogram ordered  # Right upper quadrant abdominal pain-secondary to cholecystitis -Status post fentanyl 75 mcg for pain control and ondansetron 4 mg IV per ED provider -Morphine 2 mg every 2 hours as needed for severe moderate pain, 2 days ordered -Ondansetron 4 mg IV as needed for nausea and  vomiting  # Atrial fibrillation with  RVR, new A. fib, likely secondary to severe sepsis and cholecystitis -No anticoagulation at this time -I anticipate the A. fib will resolve with continued antibiotics and IR or surgical intervention -CHA2DS2-VASc score is 3, diabetes, 65, hypertension  # Hypertension-patient is low normotensive at this time, holding valsartan 80 mg daily  # Hyperlipidemia-pravastatin 20 mg, can be resumed after general surgery evaluation  # Non-insulin-dependent diabetes mellitus-Ozempic not resumed -Insulin sliding scale for renal, with at bedtime coverage  # Metabolic syndrome-treat as above  As needed medications acetaminophen, ondansetron, morphine  DVT for prophylaxis-primary hospitalist team to resume once patient has been evaluated for possible procedure or post surgery coverage  Chart reviewed.   DVT prophylaxis: TED hose, no pharmacologic DVT prophylaxis due to possible surgery/procedure on 07/10/2020; primary hospitalist team to resume once patient has had procedure completion Code Status: Full code Diet: N.p.o., can be resumed on cardiac healthy diet once appropriate Family Communication: No, patient states everyone knows he is in the hospital and knows what is going on Disposition Plan: Pending clinical course Consults called: General surgery Admission status: Progressive cardiac, inpatient  Past Medical History:  Diagnosis Date  . Cancer [facility])    Bladder Cancer  . Erectile dysfunction associated with type 2 diabetes mellitus ([place]) 05/26/2016  . Gross hematuria 11/16/2016  . Hypertension 06/11/2015  . Type 2 diabetes mellitus with diabetic polyneuropathy, without long-term current use of insulin ([place]) 06/11/2015   Past Surgical History:  Procedure Laterality Date  . boil lanceted     hand  . COLONOSCOPY WITH PROPOFOL N/A 07/03/2020   Procedure: COLONOSCOPY WITH PROPOFOL;  Surgeon: Jonathon Bellows, MD;  Location: [facility] ENDOSCOPY;  Service: Gastroenterology;  Laterality: N/A;  . CYSTOSCOPY W/  RETROGRADES Bilateral 12/06/2016   Procedure: CYSTOSCOPY WITH RETROGRADE PYELOGRAM;  Surgeon: Hollice Espy, MD;  Location: [facility] ORS;  Service: Urology;  Laterality: Bilateral;  . CYSTOSCOPY W/ RETROGRADES Bilateral 06/27/2017   Procedure: CYSTOSCOPY WITH RETROGRADE PYELOGRAM;  Surgeon: Hollice Espy, MD;  Location: [facility] ORS;  Service: Urology;  Laterality: Bilateral;  . CYSTOSCOPY WITH BIOPSY N/A 06/27/2017   Procedure: CYSTOSCOPY WITH Bladder BIOPSY;  Surgeon: Hollice Espy, MD;  Location: [facility] ORS;  Service: Urology;  Laterality: N/A;  . CYSTOSCOPY WITH BIOPSY N/A 10/24/2017   Procedure: CYSTOSCOPY WITH Bladder BIOPSY;  Surgeon: Hollice Espy, MD;  Location: [facility] ORS;  Service: Urology;  Laterality: N/A;  . ingrown  Bilateral    ingrown toenail  . TRANSURETHRAL RESECTION OF BLADDER TUMOR N/A 12/06/2016   Procedure: TRANSURETHRAL RESECTION OF BLADDER TUMOR (TURBT) (2-5cm) CLOT EVACUATION;  Surgeon: Hollice Espy, MD;  Location: [facility] ORS;  Service: Urology;  Laterality: N/A;  . TRANSURETHRAL RESECTION OF BLADDER TUMOR N/A 06/27/2017   Procedure: TRANSURETHRAL RESECTION OF BLADDER TUMOR (TURBT);  Surgeon: Hollice Espy, MD;  Location: [facility] ORS;  Service: Urology;  Laterality: N/A;  . TRANSURETHRAL RESECTION OF BLADDER TUMOR WITH MITOMYCIN-C N/A 01/18/2017   Procedure: TRANSURETHRAL RESECTION OF BLADDER TUMOR WITH MITOMYCIN-C-(SMALL);  Surgeon: Hollice Espy, MD;  Location: [facility] ORS;  Service: Urology;  Laterality: N/A;   Social History:  reports that he has never smoked. He has never used smokeless tobacco. He reports that he does not drink alcohol and does not use drugs.  Allergies  Allergen Reactions  . Cyclobenzaprine Other (See Comments)    Not improving pain and makes him agressive.  . Lisinopril Cough   Family History  Problem Relation Age of Onset  . Heart attack Mother   .  Heart disease Mother   . Parkinson's disease Father   . Heart attack Maternal Grandfather   . Heart  disease Maternal Grandfather   . Heart attack Maternal Aunt   . Heart attack Maternal Grandmother   . Prostate cancer Neg Hx   . Bladder Cancer Neg Hx   . Kidney cancer Neg Hx    Family history: Family history reviewed and not pertinent  Prior to Admission medications   Medication Sig Start Date End Date Taking? Authorizing Provider  acetaminophen (TYLENOL) 500 MG tablet Take 500-1,000 mg by mouth every 8 (eight) hours as needed for mild pain.    [provider]  calcium carbonate (TUMS - DOSED IN MG ELEMENTAL CALCIUM) 500 MG chewable tablet Chew 2 tablets by mouth daily as needed for indigestion or heartburn.    [provider]  diphenhydramine-acetaminophen (TYLENOL PM) 25-500 MG TABS tablet Take 1-2 tablets by mouth at bedtime as needed (sleep).     [provider]  OZEMPIC, 0.25 OR 0.5 MG/DOSE, 2 MG/1.5ML SOPN Inject 1 mg into the skin once a week. 05/30/20   Karamalegos, Devonne Doughty, DO  pravastatin (PRAVACHOL) 20 MG tablet Take 1 tablet (20 mg total) by mouth daily. 05/30/20   Karamalegos, Devonne Doughty, DO  sildenafil (REVATIO) 20 MG tablet Take 1-5 pills about 30 min prior to sex Patient not taking: No sig reported 12/06/18   [name], DO  valsartan (DIOVAN) 80 MG tablet Take 1 tablet (80 mg total) by mouth daily. 06/04/20   [name], DO   Physical Exam: Vitals:   07/09/20 1715 07/09/20 1730 07/09/20 1930 07/09/20 1932  BP: 105/89 97/74 100/71   Pulse: 71 72 (!) 108   Resp: (!) 34 18 (!) 21   Temp:      TempSrc:      SpO2: 94% 93% 94% 98%  Weight:      Height:       Constitutional: appears age-appropriate, NAD, calm, comfortable Eyes: PERRL, lids and conjunctivae normal ENMT: Mucous membranes are moist. Posterior pharynx clear of any exudate or lesions. Age-appropriate dentition. Hearing appropriate Neck: normal, supple, no masses, no thyromegaly Respiratory: clear to auscultation bilaterally, no wheezing, no crackles.  Normal respiratory effort. No accessory muscle use.  Cardiovascular: Regular rate and rhythm, no murmurs / rubs / gallops. No extremity edema. 2+ pedal pulses. No carotid bruits.  Abdomen: Obese abdomen, right upper quadrant tenderness worse with deep palpation. no hepatosplenomegaly. Bowel sounds positive.  Musculoskeletal: no clubbing / cyanosis. No joint deformity upper and lower extremities. Good ROM, no contractures, no atrophy. Normal muscle tone.  Skin: no rashes, lesions, ulcers. No induration Neurologic: Sensation intact. Strength 5/5 in all 4.  Psychiatric: Normal judgment and insight. Alert and oriented x 3. Normal mood.   EKG: independently reviewed, showing atrial fibrillation with RVR, rate of 156, QTc 457  Chest x-ray on Admission: I personally reviewed and I agree with radiologist reading as below.  CT ABDOMEN PELVIS WO CONTRAST  Result Date: 07/09/2020 CLINICAL DATA:  65 year old male with abdominal pain. EXAM: CT ABDOMEN AND PELVIS WITHOUT CONTRAST TECHNIQUE: Multidetector CT imaging of the abdomen and pelvis was performed following the standard protocol without IV contrast. COMPARISON:  CT abdomen pelvis dated 11/23/2016. FINDINGS: Evaluation of this exam is limited in the absence of intravenous contrast. Lower chest: The visualized lung bases are clear. No intra-abdominal free air.  Small free fluid in the pelvis. Hepatobiliary: There is severe fatty liver. Slight irregularity of the liver  contour likely early changes of cirrhosis. Clinical correlation is recommended. No intrahepatic biliary dilatation. The gallbladder is distended. There is layering sludge and small stones within the gallbladder. There is diffuse gallbladder wall edema with pericholecystic fluid most consistent with acute cholecystitis. Further evaluation with right upper quadrant ultrasound recommended. Pancreas: Unremarkable. No pancreatic ductal dilatation or surrounding inflammatory changes. Spleen: Normal in  size without focal abnormality. Adrenals/Urinary Tract: The adrenal glands unremarkable. Bilateral renal cortex irregularity. Indeterminate 2.2 cm hypodense lesion from the lateral upper pole of the left kidney demonstrating fluid attenuation, likely cysts. This was present on the prior CT. There is no hydronephrosis or nephrolithiasis on either side. The visualized ureters appear unremarkable. There is diffuse trabeculated appearance of the bladder wall likely related to chronic bladder outlet obstruction. There is a 2 mm stone along the bladder base similar to prior CT, likely a focal calcification of the bladder wall or associated with prostate. Stomach/Bowel: There is sigmoid diverticulosis without active inflammatory changes. There is loose stool throughout the colon compatible with diarrheal state. There is no bowel obstruction. Inflammatory changes of the proximal transverse colon, likely reactive to inflammation of the gallbladder. The appendix is normal. Vascular/Lymphatic: Mild aortoiliac atherosclerotic disease. The IVC is unremarkable. No portal venous gas. There is no adenopathy. Reproductive: The prostate and seminal vesicles are grossly unremarkable. No pelvic masses Other: None Musculoskeletal: Degenerative changes of the spine. No acute osseous pathology. IMPRESSION: 1. Acute cholecystitis. Further evaluation with right upper quadrant ultrasound recommended. 2. Severe fatty liver with findings of early cirrhosis. Clinical correlation is recommended. 3. Sigmoid diverticulosis. No bowel obstruction. Normal appendix. 4. Aortic Atherosclerosis (ICD10-I70.0). Electronically Signed   By: Anner Crete M.D.   On: 07/09/2020 17:24   Labs on Admission: I have personally reviewed following labs  CBC: Recent Labs  Lab 07/09/20 1600  WBC 27.0*  HGB 18.0*  HCT 52.0  MCV 86.4  PLT 417   Basic Metabolic Panel: Recent Labs  Lab 07/09/20 1600 07/09/20 1913  NA 135  --   K 3.9  --   CL 102  --    CO2 18*  --   GLUCOSE 207*  --   BUN 39*  --   CREATININE 3.12*  --   CALCIUM 9.5  --   MG  --  2.0   GFR: Estimated Creatinine Clearance: 32.9 mL/min (A) (by C-G formula based on SCr of 3.12 mg/dL (H)).  Liver Function Tests: Recent Labs  Lab 07/09/20 1600  AST 48*  ALT 52*  ALKPHOS 88  BILITOT 2.0*  PROT 8.3*  ALBUMIN 3.9   Recent Labs  Lab 07/09/20 1600  LIPASE 24   CBG: Recent Labs  Lab 07/03/20 0822  GLUCAP 159*   Urine analysis:    Component Value Date/Time   COLORURINE YELLOW (A) 10/14/2017 1006   APPEARANCEUR Clear 04/25/2018 1022   LABSPEC 1.019 10/14/2017 1006   PHURINE 5.0 10/14/2017 1006   GLUCOSEU Negative 04/25/2018 1022   HGBUR NEGATIVE 10/14/2017 1006   BILIRUBINUR Negative 04/25/2018 1022   KETONESUR NEGATIVE 10/14/2017 1006   PROTEINUR Negative 04/25/2018 1022   PROTEINUR NEGATIVE 10/14/2017 1006   UROBILINOGEN 0.2 11/16/2016 1354   NITRITE Negative 04/25/2018 1022   NITRITE NEGATIVE 10/14/2017 1006   LEUKOCYTESUR Negative 04/25/2018 1022   CRITICAL CARE Performed by: Briant Cedar Cox  Total critical care time: 35 minutes  Critical care time was exclusive of separately billable procedures and treating other patients.  Critical care was necessary to treat or  prevent imminent or life-threatening deterioration. Severe sepsis with shock.  Critical care was time spent personally by me on the following activities: development of treatment plan with patient and/or surrogate as well as nursing, discussions with consultants, evaluation of patient's response to treatment, examination of patient, obtaining history from patient or surrogate, ordering and performing treatments and interventions, ordering and review of laboratory studies, ordering and review of radiographic studies, pulse oximetry and re-evaluation of patient's condition.  Amy N Cox D.O. [facility]  If 7PM-7AM, please contact overnight-coverage provider If 7AM-7PM, please  contact day coverage provider [email or web address]  07/09/2020, 8:10 PM

## 2020-07-10 ENCOUNTER — Inpatient Hospital Stay: Payer: Medicare Other

## 2020-07-10 DIAGNOSIS — I4891 Unspecified atrial fibrillation: Secondary | ICD-10-CM | POA: Diagnosis not present

## 2020-07-10 DIAGNOSIS — I959 Hypotension, unspecified: Secondary | ICD-10-CM | POA: Diagnosis not present

## 2020-07-10 DIAGNOSIS — A419 Sepsis, unspecified organism: Secondary | ICD-10-CM | POA: Diagnosis not present

## 2020-07-10 DIAGNOSIS — K810 Acute cholecystitis: Secondary | ICD-10-CM | POA: Diagnosis not present

## 2020-07-10 DIAGNOSIS — K8000 Calculus of gallbladder with acute cholecystitis without obstruction: Secondary | ICD-10-CM | POA: Diagnosis not present

## 2020-07-10 HISTORY — PX: IR BILIARY DRAIN PLACEMENT WITH CHOLANGIOGRAM: IMG6043

## 2020-07-10 LAB — CBC
HCT: 43 % (ref 39.0–52.0)
Hemoglobin: 14.6 g/dL (ref 13.0–17.0)
MCH: 30 pg (ref 26.0–34.0)
MCHC: 34 g/dL (ref 30.0–36.0)
MCV: 88.5 fL (ref 80.0–100.0)
Platelets: 238 10*3/uL (ref 150–400)
RBC: 4.86 MIL/uL (ref 4.22–5.81)
RDW: 12.9 % (ref 11.5–15.5)
WBC: 16.4 10*3/uL — ABNORMAL HIGH (ref 4.0–10.5)
nRBC: 0 % (ref 0.0–0.2)

## 2020-07-10 LAB — COMPREHENSIVE METABOLIC PANEL
ALT: 53 U/L — ABNORMAL HIGH (ref 0–44)
AST: 76 U/L — ABNORMAL HIGH (ref 15–41)
Albumin: 3.1 g/dL — ABNORMAL LOW (ref 3.5–5.0)
Alkaline Phosphatase: 103 U/L (ref 38–126)
Anion gap: 11 (ref 5–15)
BUN: 46 mg/dL — ABNORMAL HIGH (ref 8–23)
CO2: 21 mmol/L — ABNORMAL LOW (ref 22–32)
Calcium: 8.4 mg/dL — ABNORMAL LOW (ref 8.9–10.3)
Chloride: 104 mmol/L (ref 98–111)
Creatinine, Ser: 3.13 mg/dL — ABNORMAL HIGH (ref 0.61–1.24)
GFR, Estimated: 21 mL/min — ABNORMAL LOW (ref >60)
Glucose, Bld: 131 mg/dL — ABNORMAL HIGH (ref 70–99)
Potassium: 3.6 mmol/L (ref 3.5–5.1)
Sodium: 136 mmol/L (ref 135–145)
Total Bilirubin: 2.2 mg/dL — ABNORMAL HIGH (ref 0.3–1.2)
Total Protein: 6.6 g/dL (ref 6.5–8.1)

## 2020-07-10 LAB — CBG MONITORING, ED
Glucose-Capillary: 166 mg/dL — ABNORMAL HIGH (ref 70–99)
Glucose-Capillary: 132 mg/dL — ABNORMAL HIGH (ref 70–99)
Glucose-Capillary: 123 mg/dL — ABNORMAL HIGH (ref 70–99)

## 2020-07-10 LAB — GLUCOSE, CAPILLARY
Glucose-Capillary: 110 mg/dL — ABNORMAL HIGH (ref 70–99)
Glucose-Capillary: 155 mg/dL — ABNORMAL HIGH (ref 70–99)

## 2020-07-10 LAB — LACTIC ACID, PLASMA: Lactic Acid, Venous: 1.4 mmol/L (ref 0.5–1.9)

## 2020-07-10 LAB — SARS CORONAVIRUS 2 (TAT 6-24 HRS): SARS Coronavirus 2: NEGATIVE

## 2020-07-10 MED ORDER — MORPHINE SULFATE (PF) 2 MG/ML IV SOLN
INTRAVENOUS | Status: AC
  Administered 2020-07-10: 2 mg via INTRAVENOUS
  Filled 2020-07-10: qty 1

## 2020-07-10 MED ORDER — MIDAZOLAM HCL 5 MG/5ML IJ SOLN
INTRAMUSCULAR | Status: AC | PRN
  Administered 2020-07-10: 1 mg via INTRAVENOUS

## 2020-07-10 MED ORDER — FENTANYL CITRATE (PF) 100 MCG/2ML IJ SOLN
INTRAMUSCULAR | Status: AC | PRN
  Administered 2020-07-10: 50 ug via INTRAVENOUS

## 2020-07-10 MED ORDER — MIDAZOLAM HCL 5 MG/5ML IJ SOLN
INTRAMUSCULAR | Status: AC
  Filled 2020-07-10: qty 5

## 2020-07-10 MED ORDER — SODIUM CHLORIDE 0.9 % IV BOLUS
500.0000 mL | Freq: Once | INTRAVENOUS | Status: AC
  Administered 2020-07-10: 500 mL via INTRAVENOUS

## 2020-07-10 MED ORDER — MORPHINE SULFATE (PF) 2 MG/ML IV SOLN
2.0000 mg | INTRAVENOUS | Status: DC | PRN
  Administered 2020-07-10 – 2020-07-11 (×3): 2 mg via INTRAVENOUS
  Filled 2020-07-10 (×3): qty 1

## 2020-07-10 MED ORDER — TRAMADOL HCL 50 MG PO TABS
50.0000 mg | ORAL_TABLET | Freq: Four times a day (QID) | ORAL | Status: DC | PRN
  Administered 2020-07-11: 50 mg via ORAL
  Filled 2020-07-10: qty 1

## 2020-07-10 MED ORDER — IODIXANOL 320 MG/ML IV SOLN
50.0000 mL | Freq: Once | INTRAVENOUS | Status: AC | PRN
  Administered 2020-07-10: 10 mL

## 2020-07-10 MED ORDER — CHLORHEXIDINE GLUCONATE CLOTH 2 % EX PADS
6.0000 | MEDICATED_PAD | Freq: Every day | CUTANEOUS | Status: DC
  Administered 2020-07-11 – 2020-07-12 (×2): 6 via TOPICAL

## 2020-07-10 MED ORDER — INSULIN ASPART 100 UNIT/ML ~~LOC~~ SOLN
0.0000 [IU] | Freq: Three times a day (TID) | SUBCUTANEOUS | Status: DC
  Administered 2020-07-11 – 2020-07-12 (×3): 1 [IU] via SUBCUTANEOUS
  Filled 2020-07-10: qty 1

## 2020-07-10 MED ORDER — INSULIN ASPART 100 UNIT/ML ~~LOC~~ SOLN
0.0000 [IU] | SUBCUTANEOUS | Status: DC
  Administered 2020-07-10: 1 [IU] via SUBCUTANEOUS
  Filled 2020-07-10: qty 1

## 2020-07-10 MED ORDER — FENTANYL CITRATE (PF) 100 MCG/2ML IJ SOLN
INTRAMUSCULAR | Status: AC
  Filled 2020-07-10: qty 2

## 2020-07-10 NOTE — ED Notes (Signed)
Surgeon at bedside talking with pt.

## 2020-07-10 NOTE — ED Notes (Signed)
Pt to IR. Spoke with 2 IR nurses who have read his chart.

## 2020-07-10 NOTE — ED Notes (Signed)
CBG 132. 

## 2020-07-10 NOTE — ED Notes (Signed)
Pt states has not urinated since admitted to hospital yesterday. Bladder scan performed, revealing >530 mL urine. Bladder is clearly distended with palpation. Sent message to Dr Thereasa Solo to inform.

## 2020-07-10 NOTE — Telephone Encounter (Signed)
He went to the ER yesterday   Adelis Docter

## 2020-07-10 NOTE — ED Notes (Signed)
NP Randol Kern notified of pt's low BP- See new orders in [facility]

## 2020-07-10 NOTE — Consult Note (Addendum)
[facility] SURGICAL CONSULTATION NOTE (initial) - cpt: 11657   HISTORY OF PRESENT ILLNESS (HPI):  65 y.o. male presented to [facility] ED yesterday for evaluation of abdominal pain. Patient underwent colonoscopy on 2/17 with Dr Vicente Males. He reports that the day following the procedure, he noticed the onset of generalized abdominal pain over the course of the days which has been persistent. Nothing seems to make this better. He reports associated nausea and some dry heavy. No fever, chills, cough, CP, SOB, urinary changes, jaundice, or bowel changes. No previous intra abdominal surgeries. On arrival to the ED, he was found to be in new-onset a fib with RVR (HR 150's) and was hypotensive. Laboratory work up revealed leukocytosis 27.0K (now 16.4K), AKI with sCr - 3.13, lactic acidosis to 2.4 (now resolved), hyperbilirubinemia to 2.0 (now 2.2). CT abdomen/Pelvis was concerning for acute cholecystitis.   Surgery is consulted by emergency medicine physician Dr. Delman Kitten, MD in this context for evaluation and management of cholecystitis.   PAST MEDICAL HISTORY (PMH):  Past Medical History:  Diagnosis Date   Cancer [facility])    Bladder Cancer   Erectile dysfunction associated with type 2 diabetes mellitus ([place]) 05/26/2016   Gross hematuria 11/16/2016   Hypertension 06/11/2015   Type 2 diabetes mellitus with diabetic polyneuropathy, without long-term current use of insulin ([place]) 06/11/2015     PAST SURGICAL HISTORY ([place]):  Past Surgical History:  Procedure Laterality Date   boil lanceted     hand   COLONOSCOPY WITH PROPOFOL N/A 07/03/2020   Procedure: COLONOSCOPY WITH PROPOFOL;  Surgeon: Jonathon Bellows, MD;  Location: [facility] ENDOSCOPY;  Service: Gastroenterology;  Laterality: N/A;   CYSTOSCOPY W/ RETROGRADES Bilateral 12/06/2016   Procedure: CYSTOSCOPY WITH RETROGRADE PYELOGRAM;  Surgeon: Hollice Espy, MD;  Location: [facility] ORS;  Service: Urology;  Laterality: Bilateral;   CYSTOSCOPY W/ RETROGRADES  Bilateral 06/27/2017   Procedure: CYSTOSCOPY WITH RETROGRADE PYELOGRAM;  Surgeon: Hollice Espy, MD;  Location: [facility] ORS;  Service: Urology;  Laterality: Bilateral;   CYSTOSCOPY WITH BIOPSY N/A 06/27/2017   Procedure: CYSTOSCOPY WITH Bladder BIOPSY;  Surgeon: Hollice Espy, MD;  Location: [facility] ORS;  Service: Urology;  Laterality: N/A;   CYSTOSCOPY WITH BIOPSY N/A 10/24/2017   Procedure: CYSTOSCOPY WITH Bladder BIOPSY;  Surgeon: Hollice Espy, MD;  Location: [facility] ORS;  Service: Urology;  Laterality: N/A;   ingrown  Bilateral    ingrown toenail   TRANSURETHRAL RESECTION OF BLADDER TUMOR N/A 12/06/2016   Procedure: TRANSURETHRAL RESECTION OF BLADDER TUMOR (TURBT) (2-5cm) CLOT EVACUATION;  Surgeon: Hollice Espy, MD;  Location: [facility] ORS;  Service: Urology;  Laterality: N/A;   TRANSURETHRAL RESECTION OF BLADDER TUMOR N/A 06/27/2017   Procedure: TRANSURETHRAL RESECTION OF BLADDER TUMOR (TURBT);  Surgeon: Hollice Espy, MD;  Location: [facility] ORS;  Service: Urology;  Laterality: N/A;   TRANSURETHRAL RESECTION OF BLADDER TUMOR WITH MITOMYCIN-C N/A 01/18/2017   Procedure: TRANSURETHRAL RESECTION OF BLADDER TUMOR WITH MITOMYCIN-C-(SMALL);  Surgeon: Hollice Espy, MD;  Location: [facility] ORS;  Service: Urology;  Laterality: N/A;     MEDICATIONS:  Prior to Admission medications   Medication Sig Start Date End Date Taking? Authorizing Provider  acetaminophen (TYLENOL) 500 MG tablet Take 500-1,000 mg by mouth every 8 (eight) hours as needed for mild pain.   Yes [provider]  calcium carbonate (TUMS - DOSED IN MG ELEMENTAL CALCIUM) 500 MG chewable tablet Chew 2 tablets by mouth daily as needed for indigestion or heartburn.   Yes [provider]  diphenhydramine-acetaminophen (TYLENOL PM) 25-500 MG  TABS tablet Take 1-2 tablets by mouth at bedtime as needed (sleep).    Yes [provider]  OZEMPIC, 0.25 OR 0.5 MG/DOSE, 2 MG/1.5ML SOPN Inject 1 mg into the skin once a week. 05/30/20   Yes Karamalegos, Devonne Doughty, DO  pravastatin (PRAVACHOL) 20 MG tablet Take 1 tablet (20 mg total) by mouth daily. 05/30/20  Yes Karamalegos, Devonne Doughty, DO  valsartan (DIOVAN) 80 MG tablet Take 1 tablet (80 mg total) by mouth daily. 06/04/20  Yes Karamalegos, Devonne Doughty, DO     ALLERGIES:  Allergies  Allergen Reactions   Cyclobenzaprine Other (See Comments)    Not improving pain and makes him agressive.   Lisinopril Cough     SOCIAL HISTORY:  Social History   Socioeconomic History   Marital status: Divorced    Spouse name: Not on file   Number of children: Not on file   Years of education: Not on file   Highest education level: Not on file  Occupational History   Not on file  Tobacco Use   Smoking status: Never Smoker   Smokeless tobacco: Never Used   Tobacco comment: once a while had one cigar >10 years ago  Vaping Use   Vaping Use: Never used  Substance and Sexual Activity   Alcohol use: No    Alcohol/week: 0.0 standard drinks   Drug use: No   Sexual activity: Never  Other Topics Concern   Not on file  Social History Narrative   Not on file   Social Determinants of Health   Financial Resource Strain: Not on file  Food Insecurity: Not on file  Transportation Needs: Not on file  Physical Activity: Not on file  Stress: Not on file  Social Connections: Not on file  Intimate Partner Violence: Not on file     FAMILY HISTORY:  Family History  Problem Relation Age of Onset   Heart attack Mother    Heart disease Mother    Parkinson's disease Father    Heart attack Maternal Grandfather    Heart disease Maternal Grandfather    Heart attack Maternal Aunt    Heart attack Maternal Grandmother    Prostate cancer Neg Hx    Bladder Cancer Neg Hx    Kidney cancer Neg Hx       REVIEW OF SYSTEMS:  Review of Systems  Constitutional: Negative for chills and fever.  HENT: Negative for congestion and sore throat.   Respiratory: Negative for cough and shortness of  breath.   Cardiovascular: Negative for chest pain and palpitations.  Gastrointestinal: Positive for abdominal pain and nausea. Negative for blood in stool, constipation, diarrhea and vomiting.  Genitourinary: Negative for dysuria and urgency.  All other systems reviewed and are negative.   VITAL SIGNS:  Temp:  [97.6 F (36.4 C)-98.1 F (36.7 C)] 98.1 F (36.7 C) (02/24 0026) Pulse Rate:  [71-119] 110 (02/24 0621) Resp:  [14-34] 17 (02/24 0621) BP: (75-105)/(56-89) 90/65 (02/24 0652) SpO2:  [91 %-99 %] 91 % (02/24 0621) Weight:  [115.8 kg] 115.8 kg (02/23 1559)     Height: 6\' 4"  (193 cm) Weight: 115.8 kg BMI (Calculated): 31.1   INTAKE/OUTPUT:  02/23 0701 - 02/24 0700 In: 100 [IV Piggyback:100] Out: -   PHYSICAL EXAM:  Physical Exam Vitals and nursing note reviewed. Exam conducted with a chaperone present.  Constitutional:      Appearance: He is well-developed. He is obese.  Eyes:     General: No scleral icterus.  Extraocular Movements: Extraocular movements intact.  Cardiovascular:     Rate and Rhythm: Tachycardia present. Rhythm irregular.  Pulmonary:     Effort: Pulmonary effort is normal. No respiratory distress.  Abdominal:     General: Abdomen is protuberant. There is no distension.     Tenderness: There is abdominal tenderness in the right upper quadrant. There is no guarding or rebound. Positive signs include Murphy's sign.  Genitourinary:    Comments: Deferred Skin:    General: Skin is warm and dry.     Coloration: Skin is not jaundiced or pale.  Neurological:     General: No focal deficit present.     Mental Status: He is alert and oriented to person, place, and time.  Psychiatric:        Mood and Affect: Mood normal.        Behavior: Behavior normal.      Labs:  CBC Latest Ref Rng & Units 07/10/2020 07/09/2020 10/14/2017  WBC 4.0 - 10.5 K/uL 16.4(H) 27.0(H) 7.5  Hemoglobin 13.0 - 17.0 g/dL 14.6 18.0(H) 15.9  Hematocrit 39.0 - 52.0 % 43.0 52.0 46.9   Platelets 150 - 400 K/uL 238 310 262   CMP Latest Ref Rng & Units 07/10/2020 07/09/2020 10/14/2017  Glucose 70 - 99 mg/dL 131(H) 207(H) 159(H)  BUN 8 - 23 mg/dL 46(H) 39(H) 15  Creatinine 0.61 - 1.24 mg/dL 3.13(H) 3.12(H) 1.14  Sodium 135 - 145 mmol/L 136 135 135  Potassium 3.5 - 5.1 mmol/L 3.6 3.9 4.0  Chloride 98 - 111 mmol/L 104 102 101  CO2 22 - 32 mmol/L 21(L) 18(L) 22  Calcium 8.9 - 10.3 mg/dL 8.4(L) 9.5 9.1  Total Protein 6.5 - 8.1 g/dL 6.6 8.3(H) -  Total Bilirubin 0.3 - 1.2 mg/dL 2.2(H) 2.0(H) -  Alkaline Phos 38 - 126 U/L 103 88 -  AST 15 - 41 U/L 76(H) 48(H) -  ALT 0 - 44 U/L 53(H) 52(H) -    Imaging studies:   CT Abdomen/Pelvis (07/09/2020) personally reviewed with significant gallbladder distension and associated surrounding inflammation concerning for significant cholecystitis, and radiologist report reviewed below:  IMPRESSION: 1. Acute cholecystitis. Further evaluation with right upper quadrant ultrasound recommended. 2. Severe fatty liver with findings of early cirrhosis. Clinical correlation is recommended. 3. Sigmoid diverticulosis. No bowel obstruction. Normal appendix. 4. Aortic Atherosclerosis (ICD10-I70.0).    Assessment/Plan: (ICD-10's: K81.0) 65 y.o. male with marked leukocytosis and AKI likely secondary to sepsis secondary to acute cholecystitis, complicated by new onset Afib with RVR.   - Given his new onset Afib with RVR and likely severity of his cholecystitis, I think the safest thing to do is place percutaneous cholecystotomy tube to temporize his condition and allow for outpatient optimization prior to interval cholecystectomy. I did discuss this with IR (Dr. Earleen Newport) and they are agreeable with proceeding pending availability of the procedural lab.   - Continue NPO for now; okay to initiate CLD after drain placement  - Continue IV Abx (Cefepime + Falgyl)  - Monitor abdominal examination  - Pain control prn; antiemetics prn  - Monitor leukocytosis;  improving  - Monitor hyperbilirubinemia; likely secondary to inflammation from cholecystitis. If this worsens would need MRCP to rule out obstruction  All of the above findings and recommendations were discussed with the patient, and all of patient's questions were answered to his expressed satisfaction.  Thank you for the opportunity to participate in this patient's care.   -- Edison Simon, PA-C [facility] 07/10/2020, 7:14 AM [phone]  M-F: 7am - 4pm   I saw and evaluated the patient.  I agree with the above documentation, exam, and plan, which I have edited where appropriate. [name]  8:45 AM

## 2020-07-10 NOTE — ED Notes (Signed)
CBG 166 

## 2020-07-10 NOTE — Progress Notes (Signed)
Richard Oconnell  PJK:[id number] DOB: 01-Jan-1956 DOA: 07/09/2020 PCP: [name], DO    Brief Narrative:  65 year old with a history of HTN, obesity, DM 2, and HLD who presented to the ER with complaints of severe right upper quadrant abdominal pain for 3-4 days.  This was preceded by nausea lasting an additional 2 to 3 days and persisting after the appearance of his pain.  In the ER he was found to have a blood pressure of 83/56.  CT abdomen revealed evidence of acute cholecystitis as well as severe fatty liver with early cirrhosis.  Antibiotics were initiated and general surgery was consulted.  Significant Events:  2/23 admit via ER  Vaccination Status: Moderna x2  Antimicrobials:  Cefepime 2/23 > Flagyl 2/23 >  DVT prophylaxis: SCDs  Subjective: Blood pressure improving.  Heart rate remains labile.  Saturations 93% on room air. Pt feels much improved following her percutaneous drain placement. No new complaints.   Assessment & Plan:  Acute cholecystitis with severe sepsis POA Continue broad antibiotic coverage -General surgery following -IR performed percutaneous cholecystostomy today -sepsis physiology much improved with volume expansion  Acute kidney failure Due to sepsis with probable component of ATN - baseline creatinine approximately 1.1 -continue volume resuscitation and monitor trend -recovery may be protracted - crt was 1.14 in 2019  Recent Labs  Lab 07/09/20 1600 07/10/20 0258  CREATININE 3.12* 3.13*     Newly diagnosed atrial fibrillation with RVR Felt to be a consequence of sepsis - monitor with supportive care without anticoagulation for now -CHA2DS2-VASc is 3 therefore if persists will be a candidate for anticoagulation  HTN Blood pressure presently stable  HLD Hold medical therapy  DM2 CBG presently controlled  Obesity - Body mass index is 31.09 kg/m.   Code Status: FULL CODE Family Communication:  Status is:  Inpatient  Remains inpatient appropriate because:Inpatient level of care appropriate due to severity of illness   Dispo: The patient is from: Home              Anticipated d/c is to: Home              Anticipated d/c date is: 3 days              Patient currently is not medically stable to d/c.   Difficult to place patient No    Consultants:  Gen Surgery IR  Objective: Blood pressure 101/72, pulse 99, temperature 98.1 F (36.7 C), temperature source Oral, resp. rate 18, height 6\' 4"  (1.93 m), weight 115.8 kg, SpO2 91 %.  Intake/Output Summary (Last 24 hours) at 07/10/2020 0954 Last data filed at 07/10/2020 0915 Gross per 24 hour  Intake 600 ml  Output -  Net 600 ml   Filed Weights   07/09/20 1559  Weight: 115.8 kg    Examination: General: No acute respiratory distress Lungs: Clear to auscultation bilaterally without wheezes or crackles Cardiovascular: Regular rate and rhythm without murmur gallop or rub normal S1 and S2 Abdomen: only mildly tender in RUQ, overweight, soft, no rebound  Extremities: No significant cyanosis, clubbing, or edema bilateral lower extremities  CBC: Recent Labs  Lab 07/09/20 1600 07/10/20 0258  WBC 27.0* 16.4*  HGB 18.0* 14.6  HCT 52.0 43.0  MCV 86.4 88.5  PLT 310 809   Basic Metabolic Panel: Recent Labs  Lab 07/09/20 1600 07/09/20 1913 07/10/20 0258  NA 135  --  136  K 3.9  --  3.6  CL 102  --  104  CO2 18*  --  21*  GLUCOSE 207*  --  131*  BUN 39*  --  46*  CREATININE 3.12*  --  3.13*  CALCIUM 9.5  --  8.4*  MG  --  2.0  --    GFR: Estimated Creatinine Clearance: 32.7 mL/min (A) (by C-G formula based on SCr of 3.13 mg/dL (H)).  Liver Function Tests: Recent Labs  Lab 07/09/20 1600 07/10/20 0258  AST 48* 76*  ALT 52* 53*  ALKPHOS 88 103  BILITOT 2.0* 2.2*  PROT 8.3* 6.6  ALBUMIN 3.9 3.1*   Recent Labs  Lab 07/09/20 1600  LIPASE 24    HbA1C: Hemoglobin A1C  Date/Time Value Ref Range Status  12/27/2019  12:00 AM 6.9  Final    Comment:    [facility]  03/09/2019 11:22 AM 5.6 4.0 - 5.6 % Final  12/06/2018 10:26 AM 6.7 (A) 4.0 - 5.6 % Final   Hgb A1c MFr Bld  Date/Time Value Ref Range Status  05/30/2020 10:47 AM 7.2 (H) <5.7 % of total Hgb Final    Comment:    For someone without known diabetes, a hemoglobin A1c value of 6.5% or greater indicates that they may have  diabetes and this should be confirmed with a follow-up  test. . For someone with known diabetes, a value <7% indicates  that their diabetes is well controlled and a value  greater than or equal to 7% indicates suboptimal  control. A1c targets should be individualized based on  duration of diabetes, age, comorbid conditions, and  other considerations. . Currently, no consensus exists regarding use of hemoglobin A1c for diagnosis of diabetes for children. .   11/24/2016 08:18 AM 6.4 (H) <5.7 % Final    Comment:      For someone without known diabetes, a hemoglobin A1c value between 5.7% and 6.4% is consistent with prediabetes and should be confirmed with a follow-up test.   For someone with known diabetes, a value <7% indicates that their diabetes is well controlled. A1c targets should be individualized based on duration of diabetes, age, co-morbid conditions and other considerations.   This assay result is consistent with an increased risk of diabetes.   Currently, no consensus exists regarding use of hemoglobin A1c for diagnosis of diabetes in children.       CBG: Recent Labs  Lab 07/09/20 2123 07/10/20 0755  GLUCAP 139* 166*    Recent Results (from the past 240 hour(s))  SARS CORONAVIRUS 2 (TAT 6-24 HRS) Nasopharyngeal Nasopharyngeal Swab     Status: None   Collection Time: 07/01/20 11:07 AM   Specimen: Nasopharyngeal Swab  Result Value Ref Range Status   SARS Coronavirus 2 NEGATIVE NEGATIVE Final    Comment: (NOTE) SARS-CoV-2 target nucleic acids are NOT DETECTED.  The SARS-CoV-2 RNA is  generally detectable in upper and lower respiratory specimens during the acute phase of infection. Negative results do not preclude SARS-CoV-2 infection, do not rule out co-infections with other pathogens, and should not be used as the sole basis for treatment or other patient management decisions. Negative results must be combined with clinical observations, patient history, and epidemiological information. The expected result is Negative.  Fact Sheet for Patients: SugarRoll.be  Fact Sheet for Healthcare Providers: https://www.woods-mathews.com/  This test is not yet approved or cleared by the [place] FDA and  has been authorized for detection and/or diagnosis of SARS-CoV-2 by FDA under an Emergency Use Authorization (EUA). This EUA will remain  in effect (  meaning this test can be used) for the duration of the COVID-19 declaration under Se ction 564(b)(1) of the Act, 21 U.S.C. section 360bbb-3(b)(1), unless the authorization is terminated or revoked sooner.  Performed at [facility], [place] [address] [place], [place] [address]   Culture, blood (Routine X 2) w Reflex to ID Panel     Status: None (Preliminary result)   Collection Time: 07/09/20  4:44 PM   Specimen: BLOOD  Result Value Ref Range Status   Specimen Description BLOOD BLOOD RIGHT ARM  Final   Special Requests   Final    BOTTLES DRAWN AEROBIC AND ANAEROBIC Blood Culture results may not be optimal due to an excessive volume of blood received in culture bottles   Culture   Final    NO GROWTH < 12 HOURS Performed at [facility], [address] [place], [place] [address]    Report Status PENDING  Incomplete  Culture, blood (Routine X 2) w Reflex to ID Panel     Status: None (Preliminary result)   Collection Time: 07/09/20  4:50 PM   Specimen: BLOOD  Result Value Ref Range Status   Specimen Description BLOOD BLOOD LEFT ARM  Final   Special Requests   Final     BOTTLES DRAWN AEROBIC AND ANAEROBIC Blood Culture results may not be optimal due to an excessive volume of blood received in culture bottles   Culture   Final    NO GROWTH < 12 HOURS Performed at [facility], [address] [place], [place] [address]    Report Status PENDING  Incomplete  SARS CORONAVIRUS 2 (TAT 6-24 HRS) Nasopharyngeal Nasopharyngeal Swab     Status: None   Collection Time: 07/09/20  6:35 PM   Specimen: Nasopharyngeal Swab  Result Value Ref Range Status   SARS Coronavirus 2 NEGATIVE NEGATIVE Final    Comment: (NOTE) SARS-CoV-2 target nucleic acids are NOT DETECTED.  The SARS-CoV-2 RNA is generally detectable in upper and lower respiratory specimens during the acute phase of infection. Negative results do not preclude SARS-CoV-2 infection, do not rule out co-infections with other pathogens, and should not be used as the sole basis for treatment or other patient management decisions. Negative results must be combined with clinical observations, patient history, and epidemiological information. The expected result is Negative.  Fact Sheet for Patients: SugarRoll.be  Fact Sheet for Healthcare Providers: https://www.woods-mathews.com/  This test is not yet approved or cleared by the [place] FDA and  has been authorized for detection and/or diagnosis of SARS-CoV-2 by FDA under an Emergency Use Authorization (EUA). This EUA will remain  in effect (meaning this test can be used) for the duration of the COVID-19 declaration under Se ction 564(b)(1) of the Act, 21 U.S.C. section 360bbb-3(b)(1), unless the authorization is terminated or revoked sooner.  Performed at [facility], [place] [address] [place], [place] [address]      Scheduled Meds: . fluticasone  2 spray Each Nare Daily  . insulin aspart  0-5 Units Subcutaneous QHS  . insulin aspart  0-9 Units Subcutaneous TID WC   Continuous  Infusions: . ceFEPime (MAXIPIME) IV 2 g (07/10/20 0917)  . lactated ringers 150 mL/hr at 07/10/20 0244  . metronidazole 500 mg (07/10/20 0921)     LOS: 1 day   Cherene Altes, MD [facility] Office  [phone] Pager - Text Page per Amion  If 7PM-7AM, please contact night-coverage per Amion 07/10/2020, 9:54 AM

## 2020-07-10 NOTE — Procedures (Signed)
Interventional Radiology Procedure Note  Procedure: Image guided drain placement, perc chole.  10.46F pigtail drain.  Complications: None  EBL: None Sample: Culture sent  Recommendations: - Routine drain care, with sterile flushes, record output - follow up Cx - routine wound care  Signed,  Dulcy Fanny. Earleen Newport, DO

## 2020-07-10 NOTE — ED Notes (Signed)
This RN called IR that pt's room is ready and pt can go to room after procedure.

## 2020-07-10 NOTE — Consult Note (Signed)
Chief Complaint:  Abdominal Pain  Referring Physician(s): * No referring provider recorded for this case *  Supervising Physician: Corrie Mckusick  Patient Status: [facility] - ED  History of Present Illness: Richard Oconnell is a 65 y.o. male presenting yesterday for worsening abdominal pain.   He reports having colonoscopy Thursday last week.  He started to feel bloated on Saturday, with diffuse mild pain, which then localized to the RUQ.    He reports one episode of subjective fever at home.  Denies rigors. Reports nausea, no vomiting.  Decreased appetite.  Has not eaten in "a few days". Denies resting chest pain.    Denies any prior MI or stroke.  Reports well controlled DM, with good hgbA1C over time.  No history of CHF.   He has leukocytosis 16.4, with increased AST/ALT and tBili of 2.2.  He appears to have acute renal insufficiency, with Cr 3.13 and BUN 46.   CT shows evidence of acute calculus cholecystitis.   He has history of bladder cancer, with treatment described what sounds like 4 x TURBT.   Past Medical History:  Diagnosis Date  . Cancer [facility])    Bladder Cancer  . Erectile dysfunction associated with type 2 diabetes mellitus ([place]) 05/26/2016  . Gross hematuria 11/16/2016  . Hypertension 06/11/2015  . Type 2 diabetes mellitus with diabetic polyneuropathy, without long-term current use of insulin ([place]) 06/11/2015    Past Surgical History:  Procedure Laterality Date  . boil lanceted     hand  . COLONOSCOPY WITH PROPOFOL N/A 07/03/2020   Procedure: COLONOSCOPY WITH PROPOFOL;  Surgeon: Jonathon Bellows, MD;  Location: [facility] ENDOSCOPY;  Service: Gastroenterology;  Laterality: N/A;  . CYSTOSCOPY W/ RETROGRADES Bilateral 12/06/2016   Procedure: CYSTOSCOPY WITH RETROGRADE PYELOGRAM;  Surgeon: Hollice Espy, MD;  Location: [facility] ORS;  Service: Urology;  Laterality: Bilateral;  . CYSTOSCOPY W/ RETROGRADES Bilateral 06/27/2017   Procedure: CYSTOSCOPY WITH RETROGRADE PYELOGRAM;   Surgeon: Hollice Espy, MD;  Location: [facility] ORS;  Service: Urology;  Laterality: Bilateral;  . CYSTOSCOPY WITH BIOPSY N/A 06/27/2017   Procedure: CYSTOSCOPY WITH Bladder BIOPSY;  Surgeon: Hollice Espy, MD;  Location: [facility] ORS;  Service: Urology;  Laterality: N/A;  . CYSTOSCOPY WITH BIOPSY N/A 10/24/2017   Procedure: CYSTOSCOPY WITH Bladder BIOPSY;  Surgeon: Hollice Espy, MD;  Location: [facility] ORS;  Service: Urology;  Laterality: N/A;  . ingrown  Bilateral    ingrown toenail  . TRANSURETHRAL RESECTION OF BLADDER TUMOR N/A 12/06/2016   Procedure: TRANSURETHRAL RESECTION OF BLADDER TUMOR (TURBT) (2-5cm) CLOT EVACUATION;  Surgeon: Hollice Espy, MD;  Location: [facility] ORS;  Service: Urology;  Laterality: N/A;  . TRANSURETHRAL RESECTION OF BLADDER TUMOR N/A 06/27/2017   Procedure: TRANSURETHRAL RESECTION OF BLADDER TUMOR (TURBT);  Surgeon: Hollice Espy, MD;  Location: [facility] ORS;  Service: Urology;  Laterality: N/A;  . TRANSURETHRAL RESECTION OF BLADDER TUMOR WITH MITOMYCIN-C N/A 01/18/2017   Procedure: TRANSURETHRAL RESECTION OF BLADDER TUMOR WITH MITOMYCIN-C-(SMALL);  Surgeon: Hollice Espy, MD;  Location: [facility] ORS;  Service: Urology;  Laterality: N/A;    Allergies: Cyclobenzaprine and Lisinopril  Medications: Prior to Admission medications   Medication Sig Start Date End Date Taking? Authorizing Provider  acetaminophen (TYLENOL) 500 MG tablet Take 500-1,000 mg by mouth every 8 (eight) hours as needed for mild pain.   Yes [provider]  calcium carbonate (TUMS - DOSED IN MG ELEMENTAL CALCIUM) 500 MG chewable tablet Chew 2 tablets by mouth daily as needed for indigestion or heartburn.   Yes [provider]  diphenhydramine-acetaminophen (TYLENOL PM) 25-500 MG TABS tablet Take 1-2 tablets by mouth at bedtime as needed (sleep).    Yes [provider]  OZEMPIC, 0.25 OR 0.5 MG/DOSE, 2 MG/1.5ML SOPN Inject 1 mg into the skin once a week. 05/30/20  Yes Karamalegos,  Devonne Doughty, DO  pravastatin (PRAVACHOL) 20 MG tablet Take 1 tablet (20 mg total) by mouth daily. 05/30/20  Yes Karamalegos, Devonne Doughty, DO  valsartan (DIOVAN) 80 MG tablet Take 1 tablet (80 mg total) by mouth daily. 06/04/20  Yes Karamalegos, Devonne Doughty, DO     Family History  Problem Relation Age of Onset  . Heart attack Mother   . Heart disease Mother   . Parkinson's disease Father   . Heart attack Maternal Grandfather   . Heart disease Maternal Grandfather   . Heart attack Maternal Aunt   . Heart attack Maternal Grandmother   . Prostate cancer Neg Hx   . Bladder Cancer Neg Hx   . Kidney cancer Neg Hx     Social History   Socioeconomic History  . Marital status: Divorced    Spouse name: Not on file  . Number of children: Not on file  . Years of education: Not on file  . Highest education level: Not on file  Occupational History  . Not on file  Tobacco Use  . Smoking status: Never Smoker  . Smokeless tobacco: Never Used  . Tobacco comment: once a while had one cigar >10 years ago  Vaping Use  . Vaping Use: Never used  Substance and Sexual Activity  . Alcohol use: No    Alcohol/week: 0.0 standard drinks  . Drug use: No  . Sexual activity: Never  Other Topics Concern  . Not on file  Social History Narrative  . Not on file   Social Determinants of Health   Financial Resource Strain: Not on file  Food Insecurity: Not on file  Transportation Needs: Not on file  Physical Activity: Not on file  Stress: Not on file  Social Connections: Not on file       Review of Systems: A 12 point ROS discussed and pertinent positives are indicated in the HPI above.  All other systems are negative.  Review of Systems  Vital Signs: BP 90/65 (BP Location: Left Arm)   Pulse (!) 110   Temp 98.1 F (36.7 C) (Oral)   Resp 17   Ht 6\' 4"  (1.93 m)   Wt 115.8 kg   SpO2 91%   BMI 31.09 kg/m   Physical Exam General: 65 yo male appearing stated age.  Well-developed,  well-nourished.  No distress. HEENT: Atraumatic, normocephalic.  Conjugate gaze, extra-ocular motor intact. No scleral icterus or scleral injection. No lesions on external ears, nose, lips, or gums.  Oral mucosa moist, pink.  Neck: Symmetric with no goiter enlargement.  Chest/Lungs:  Symmetric chest with inspiration/expiration.  No labored breathing.  Clear to auscultation with no wheezes, rhonchi, or rales.  Heart:   . No JVD appreciated.  Abdomen: Obese,  TTP, + murphy's sign.  No peritoneal signs/rigidity.   Genito-urinary: Deferred Neurologic: Alert & Oriented to person, place, and time.   Normal affect and insight.  Appropriate questions.  Moving all 4 extremities with gross sensory intact.       Imaging: CT ABDOMEN PELVIS WO CONTRAST  Result Date: 07/09/2020 CLINICAL DATA:  65 year old male with abdominal pain. EXAM: CT ABDOMEN AND PELVIS WITHOUT CONTRAST TECHNIQUE: Multidetector CT imaging of the abdomen and  pelvis was performed following the standard protocol without IV contrast. COMPARISON:  CT abdomen pelvis dated 11/23/2016. FINDINGS: Evaluation of this exam is limited in the absence of intravenous contrast. Lower chest: The visualized lung bases are clear. No intra-abdominal free air.  Small free fluid in the pelvis. Hepatobiliary: There is severe fatty liver. Slight irregularity of the liver contour likely early changes of cirrhosis. Clinical correlation is recommended. No intrahepatic biliary dilatation. The gallbladder is distended. There is layering sludge and small stones within the gallbladder. There is diffuse gallbladder wall edema with pericholecystic fluid most consistent with acute cholecystitis. Further evaluation with right upper quadrant ultrasound recommended. Pancreas: Unremarkable. No pancreatic ductal dilatation or surrounding inflammatory changes. Spleen: Normal in size without focal abnormality. Adrenals/Urinary Tract: The adrenal glands unremarkable. Bilateral renal  cortex irregularity. Indeterminate 2.2 cm hypodense lesion from the lateral upper pole of the left kidney demonstrating fluid attenuation, likely cysts. This was present on the prior CT. There is no hydronephrosis or nephrolithiasis on either side. The visualized ureters appear unremarkable. There is diffuse trabeculated appearance of the bladder wall likely related to chronic bladder outlet obstruction. There is a 2 mm stone along the bladder base similar to prior CT, likely a focal calcification of the bladder wall or associated with prostate. Stomach/Bowel: There is sigmoid diverticulosis without active inflammatory changes. There is loose stool throughout the colon compatible with diarrheal state. There is no bowel obstruction. Inflammatory changes of the proximal transverse colon, likely reactive to inflammation of the gallbladder. The appendix is normal. Vascular/Lymphatic: Mild aortoiliac atherosclerotic disease. The IVC is unremarkable. No portal venous gas. There is no adenopathy. Reproductive: The prostate and seminal vesicles are grossly unremarkable. No pelvic masses Other: None Musculoskeletal: Degenerative changes of the spine. No acute osseous pathology. IMPRESSION: 1. Acute cholecystitis. Further evaluation with right upper quadrant ultrasound recommended. 2. Severe fatty liver with findings of early cirrhosis. Clinical correlation is recommended. 3. Sigmoid diverticulosis. No bowel obstruction. Normal appendix. 4. Aortic Atherosclerosis (ICD10-I70.0). Electronically Signed   By: Anner Crete M.D.   On: 07/09/2020 17:24    Labs:  CBC: Recent Labs    07/09/20 1600 07/10/20 0258  WBC 27.0* 16.4*  HGB 18.0* 14.6  HCT 52.0 43.0  PLT 310 238    COAGS: No results for input(s): INR, APTT in the last 8760 hours.  BMP: Recent Labs    07/09/20 1600 07/10/20 0258  NA 135 136  K 3.9 3.6  CL 102 104  CO2 18* 21*  GLUCOSE 207* 131*  BUN 39* 46*  CALCIUM 9.5 8.4*  CREATININE 3.12*  3.13*  GFRNONAA 21* 21*    LIVER FUNCTION TESTS: Recent Labs    07/09/20 1600 07/10/20 0258  BILITOT 2.0* 2.2*  AST 48* 76*  ALT 52* 53*  ALKPHOS 88 103  PROT 8.3* 6.6  ALBUMIN 3.9 3.1*    TUMOR MARKERS: No results for input(s): AFPTM, CEA, CA199, CHROMGRNA in the last 8760 hours.  Assessment and Plan:  65 yo male presents with acute abdominal pain, acute calculus cholelithiasis.    He has negative history of heart disease, though has arrhythmia now in the ED.    He is not on blood thinners currently.    Formal surgical consultation is pending.   I discussed with him the logistics of percutaneous cholecystostomy. Risks and benefits discussed with the patient including bleeding, infection, damage to adjacent structures, bowel perforation/fistula connection, and sepsis.  Unlikely, but also there is possibility of a permanent tube drainage.  All of the patient's questions were answered, patient is agreeable to proceed.   Consent not yet signed, pending surgical consult.    Plan: - VIR can perform percutaneous cholecystostomy as needed, when room time allows, if surgery is not scheduled at this time.  - Consent can be signed if needed, after informed consent was performed in room - Continue NPO and hydration  Thank you for this interesting consult.  I greatly enjoyed meeting Durward Matranga and look forward to participating in their care.  A copy of this report was sent to the requesting provider on this date.  Electronically Signed: Corrie Mckusick, DO 07/10/2020, 8:07 AM   I spent a total of 55 Miinutes    in face to face in clinical consultation, greater than 50% of which was counseling/coordinating care for acute calculus cholecystitis, possible perc chole

## 2020-07-11 ENCOUNTER — Other Ambulatory Visit: Payer: Medicare Other

## 2020-07-11 DIAGNOSIS — I4891 Unspecified atrial fibrillation: Secondary | ICD-10-CM | POA: Diagnosis not present

## 2020-07-11 DIAGNOSIS — I959 Hypotension, unspecified: Secondary | ICD-10-CM | POA: Diagnosis not present

## 2020-07-11 DIAGNOSIS — K810 Acute cholecystitis: Secondary | ICD-10-CM | POA: Diagnosis not present

## 2020-07-11 DIAGNOSIS — A419 Sepsis, unspecified organism: Secondary | ICD-10-CM | POA: Diagnosis not present

## 2020-07-11 LAB — HIV ANTIBODY (ROUTINE TESTING W REFLEX): HIV Screen 4th Generation wRfx: NONREACTIVE

## 2020-07-11 LAB — GLUCOSE, CAPILLARY
Glucose-Capillary: 116 mg/dL — ABNORMAL HIGH (ref 70–99)
Glucose-Capillary: 138 mg/dL — ABNORMAL HIGH (ref 70–99)
Glucose-Capillary: 131 mg/dL — ABNORMAL HIGH (ref 70–99)
Glucose-Capillary: 110 mg/dL — ABNORMAL HIGH (ref 70–99)

## 2020-07-11 LAB — COMPREHENSIVE METABOLIC PANEL
ALT: 47 U/L — ABNORMAL HIGH (ref 0–44)
AST: 67 U/L — ABNORMAL HIGH (ref 15–41)
Albumin: 2.7 g/dL — ABNORMAL LOW (ref 3.5–5.0)
Alkaline Phosphatase: 115 U/L (ref 38–126)
Anion gap: 7 (ref 5–15)
BUN: 44 mg/dL — ABNORMAL HIGH (ref 8–23)
CO2: 22 mmol/L (ref 22–32)
Calcium: 7.9 mg/dL — ABNORMAL LOW (ref 8.9–10.3)
Chloride: 106 mmol/L (ref 98–111)
Creatinine, Ser: 2.16 mg/dL — ABNORMAL HIGH (ref 0.61–1.24)
GFR, Estimated: 33 mL/min — ABNORMAL LOW (ref >60)
Glucose, Bld: 126 mg/dL — ABNORMAL HIGH (ref 70–99)
Potassium: 3.3 mmol/L — ABNORMAL LOW (ref 3.5–5.1)
Sodium: 135 mmol/L (ref 135–145)
Total Bilirubin: 1.3 mg/dL — ABNORMAL HIGH (ref 0.3–1.2)
Total Protein: 5.8 g/dL — ABNORMAL LOW (ref 6.5–8.1)

## 2020-07-11 LAB — CBC
HCT: 38.6 % — ABNORMAL LOW (ref 39.0–52.0)
Hemoglobin: 13.1 g/dL (ref 13.0–17.0)
MCH: 29.9 pg (ref 26.0–34.0)
MCHC: 33.9 g/dL (ref 30.0–36.0)
MCV: 88.1 fL (ref 80.0–100.0)
Platelets: 254 10*3/uL (ref 150–400)
RBC: 4.38 MIL/uL (ref 4.22–5.81)
RDW: 13.1 % (ref 11.5–15.5)
WBC: 10.7 10*3/uL — ABNORMAL HIGH (ref 4.0–10.5)
nRBC: 0 % (ref 0.0–0.2)

## 2020-07-11 LAB — T4, FREE: Free T4: 0.92 ng/dL (ref 0.61–1.12)

## 2020-07-11 LAB — TSH: TSH: 0.16 u[IU]/mL — ABNORMAL LOW (ref 0.350–4.500)

## 2020-07-11 LAB — MAGNESIUM: Magnesium: 2 mg/dL (ref 1.7–2.4)

## 2020-07-11 MED ORDER — METOPROLOL TARTRATE 25 MG PO TABS
12.5000 mg | ORAL_TABLET | Freq: Two times a day (BID) | ORAL | Status: DC
  Administered 2020-07-11 – 2020-07-12 (×3): 12.5 mg via ORAL
  Filled 2020-07-11 (×3): qty 1

## 2020-07-11 MED ORDER — POTASSIUM CHLORIDE CRYS ER 20 MEQ PO TBCR
40.0000 meq | EXTENDED_RELEASE_TABLET | Freq: Once | ORAL | Status: AC
  Administered 2020-07-11: 40 meq via ORAL
  Filled 2020-07-11: qty 2

## 2020-07-11 NOTE — Progress Notes (Signed)
Mobility Specialist - Progress Note   07/11/20 1300  Mobility  Activity Ambulated in room;Sat and stood x 3  Level of Assistance Independent  Assistive Device None  Distance Ambulated (ft) 100 ft  Mobility Response Tolerated well  Mobility performed by Mobility specialist  $Mobility charge 1 Mobility    Pre-mobility: 92 HR, 95% SpO2 During mobility: 103 HR, 93% SpO2 Post-mobility: 90 HR, 94% SpO2   Pt was lying in bed upon arrival utilizing room air. Pt agreed to session. Pt denied pain, nausea, and fatigue. Pt hyper-verbal throughout session, needing redirection several times. Pt in independent in all transfers this date, including ambulation. Pt sat EOB with no c/o dizziness. Pt stood at bedside, no AD in use for session. Pt ambulated 100' in room, declining further ambulation into hallway. Pt denied weakness, but did voice that he has neuropathy in his LE. Pt denied SOB. Pt able to ambulate forward and laterally with no LOB noted. Pt states that he would run everyday PTA to maintain his health. Upon return to EOB, pt was able to complete STSx3 pushing off of knees instead of bedside (states he feels more comfortable that way). Overall, pt tolerated session well. Pt was left in bed with all needs in reach. Nurse entered at the end of session.    Kathee Delton Mobility Specialist 07/11/20, 1:59 PM

## 2020-07-11 NOTE — Plan of Care (Signed)

## 2020-07-11 NOTE — Care Management Important Message (Signed)
Important Message  Patient Details  Name: Richard Oconnell MRN: [id number] Date of Birth: 1955/10/14   Medicare Important Message Given:  Yes     Dannette Barbara 07/11/2020, 12:30 PM

## 2020-07-11 NOTE — Progress Notes (Signed)
Richard Oconnell  NTI:[id number] DOB: 07-13-1955 DOA: 07/09/2020 PCP: [name], DO    Brief Narrative:  [phone] with a history of HTN, obesity, DM 2, and HLD who presented to the ER with complaints of severe right upper quadrant abdominal pain for 3-4 days.  This was preceded by nausea lasting an additional 2 to 3 days and persisting after the appearance of his pain.  In the ER he was found to have a blood pressure of 83/56.  CT abdomen revealed evidence of acute cholecystitis as well as severe fatty liver with early cirrhosis.  Antibiotics were initiated and general surgery was consulted.  Significant Events:  2/23 admit via ER  Vaccination Status: Moderna x2  Antimicrobials:  Cefepime 2/23 > Flagyl 2/23 >  DVT prophylaxis: SCDs  Subjective: Feeling much better. Tolerating regular diet thus far. Denies cp, sob, n/v. Reports only minimal abdom pain.   Foley cath remains in place.   Assessment & Plan:  Acute cholecystitis with severe sepsis POA Continue broad antibiotic coverage -General surgery following -IR performed percutaneous cholecystostomy 2/24 - sepsis physiology much improved with volume expansion - anticipate d/c home 2/26 if renal fxn continues to improve and pt is able to urinate w/o his foley - f/u w/ Gen Surg as outpt for timing of surgical GB removal - f/u w/ IR regarding care of his drain   Acute kidney failure Due to sepsis with probable component of ATN - baseline creatinine approximately 1.1 -continue volume resuscitation and monitor trend -recovery may be protracted - crt was 1.14 in 2019 - need to confirm continued improvement prior to d/c   Recent Labs  Lab 07/09/20 1600 07/10/20 0258 07/11/20 0603  CREATININE 3.12* 3.13* 2.16*    Acute urinary retention - hx of Bladder CA Pt denies hx of this prior to this illness - d/c foley in AM and assure pt able to urinate prior to d/c   Newly diagnosed atrial fibrillation with RVR Felt to be a  consequence of sepsis - monitor with supportive care without anticoagulation for now -CHA2DS2-VASc is 3 therefore if persists will be a candidate for anticoagulation  HTN Blood pressure presently stable  HLD Hold medical therapy  DM2 CBG presently controlled  Obesity - Body mass index is 32.42 kg/m.   Code Status: FULL CODE Family Communication:  Status is: Inpatient  Remains inpatient appropriate because:Inpatient level of care appropriate due to severity of illness   Dispo: The patient is from: Home              Anticipated d/c is to: Home              Anticipated d/c date is: 3 days              Patient currently is not medically stable to d/c.   Difficult to place patient No    Consultants:  Gen Surgery IR  Objective: Blood pressure 132/78, pulse 88, temperature 98.3 F (36.8 C), temperature source Oral, resp. rate 18, height 6\' 4"  (1.93 m), weight 120.8 kg, SpO2 95 %.  Intake/Output Summary (Last 24 hours) at 07/11/2020 1038 Last data filed at 07/11/2020 1005 Gross per 24 hour  Intake 1430 ml  Output 2950 ml  Net -1520 ml   Filed Weights   07/09/20 1559 07/10/20 1417 07/11/20 0302  Weight: 115.8 kg 115.7 kg 120.8 kg    Examination: General: No acute respiratory distress Lungs: Clear to auscultation B w/o wheezing  Cardiovascular: Regular rate and  rhythm - no M  Abdomen: only mildly tender in RUQ, overweight, soft, no rebound  Extremities: No C/C/E B LE   CBC: Recent Labs  Lab 07/09/20 1600 07/10/20 0258 07/11/20 0603  WBC 27.0* 16.4* 10.7*  HGB 18.0* 14.6 13.1  HCT 52.0 43.0 38.6*  MCV 86.4 88.5 88.1  PLT 310 238 631   Basic Metabolic Panel: Recent Labs  Lab 07/09/20 1600 07/09/20 1913 07/10/20 0258 07/11/20 0603  NA 135  --  136 135  K 3.9  --  3.6 3.3*  CL 102  --  104 106  CO2 18*  --  21* 22  GLUCOSE 207*  --  131* 126*  BUN 39*  --  46* 44*  CREATININE 3.12*  --  3.13* 2.16*  CALCIUM 9.5  --  8.4* 7.9*  MG  --  2.0  --  2.0    GFR: Estimated Creatinine Clearance: 48.4 mL/min (A) (by C-G formula based on SCr of 2.16 mg/dL (H)).  Liver Function Tests: Recent Labs  Lab 07/09/20 1600 07/10/20 0258 07/11/20 0603  AST 48* 76* 67*  ALT 52* 53* 47*  ALKPHOS 88 103 115  BILITOT 2.0* 2.2* 1.3*  PROT 8.3* 6.6 5.8*  ALBUMIN 3.9 3.1* 2.7*   Recent Labs  Lab 07/09/20 1600  LIPASE 24    HbA1C: Hemoglobin A1C  Date/Time Value Ref Range Status  12/27/2019 12:00 AM 6.9  Final    Comment:    [facility]  03/09/2019 11:22 AM 5.6 4.0 - 5.6 % Final  12/06/2018 10:26 AM 6.7 (A) 4.0 - 5.6 % Final   Hgb A1c MFr Bld  Date/Time Value Ref Range Status  05/30/2020 10:47 AM 7.2 (H) <5.7 % of total Hgb Final    Comment:    For someone without known diabetes, a hemoglobin A1c value of 6.5% or greater indicates that they may have  diabetes and this should be confirmed with a follow-up  test. . For someone with known diabetes, a value <7% indicates  that their diabetes is well controlled and a value  greater than or equal to 7% indicates suboptimal  control. A1c targets should be individualized based on  duration of diabetes, age, comorbid conditions, and  other considerations. . Currently, no consensus exists regarding use of hemoglobin A1c for diagnosis of diabetes for children. .   11/24/2016 08:18 AM 6.4 (H) <5.7 % Final    Comment:      For someone without known diabetes, a hemoglobin A1c value between 5.7% and 6.4% is consistent with prediabetes and should be confirmed with a follow-up test.   For someone with known diabetes, a value <7% indicates that their diabetes is well controlled. A1c targets should be individualized based on duration of diabetes, age, co-morbid conditions and other considerations.   This assay result is consistent with an increased risk of diabetes.   Currently, no consensus exists regarding use of hemoglobin A1c for diagnosis of diabetes in children.        CBG: Recent Labs  Lab 07/10/20 1156 07/10/20 1416 07/10/20 1635 07/10/20 2000 07/11/20 0807  GLUCAP 132* 123* 110* 155* 116*    Recent Results (from the past 240 hour(s))  SARS CORONAVIRUS 2 (TAT 6-24 HRS) Nasopharyngeal Nasopharyngeal Swab     Status: None   Collection Time: 07/01/20 11:07 AM   Specimen: Nasopharyngeal Swab  Result Value Ref Range Status   SARS Coronavirus 2 NEGATIVE NEGATIVE Final    Comment: (NOTE) SARS-CoV-2 target nucleic acids are NOT  DETECTED.  The SARS-CoV-2 RNA is generally detectable in upper and lower respiratory specimens during the acute phase of infection. Negative results do not preclude SARS-CoV-2 infection, do not rule out co-infections with other pathogens, and should not be used as the sole basis for treatment or other patient management decisions. Negative results must be combined with clinical observations, patient history, and epidemiological information. The expected result is Negative.  Fact Sheet for Patients: SugarRoll.be  Fact Sheet for Healthcare Providers: https://www.woods-mathews.com/  This test is not yet approved or cleared by the [place] FDA and  has been authorized for detection and/or diagnosis of SARS-CoV-2 by FDA under an Emergency Use Authorization (EUA). This EUA will remain  in effect (meaning this test can be used) for the duration of the COVID-19 declaration under Se ction 564(b)(1) of the Act, 21 U.S.C. section 360bbb-3(b)(1), unless the authorization is terminated or revoked sooner.  Performed at [facility], [place] [address], [place], [place] [address]   Culture, blood (Routine X 2) w Reflex to ID Panel     Status: None (Preliminary result)   Collection Time: 07/09/20  4:44 PM   Specimen: BLOOD  Result Value Ref Range Status   Specimen Description BLOOD BLOOD RIGHT ARM  Final   Special Requests   Final    BOTTLES DRAWN AEROBIC AND ANAEROBIC Blood  Culture results may not be optimal due to an excessive volume of blood received in culture bottles   Culture   Final    NO GROWTH 2 DAYS Performed at [facility], [address] [place], [place] [address]    Report Status PENDING  Incomplete  Culture, blood (Routine X 2) w Reflex to ID Panel     Status: None (Preliminary result)   Collection Time: 07/09/20  4:50 PM   Specimen: BLOOD  Result Value Ref Range Status   Specimen Description BLOOD BLOOD LEFT ARM  Final   Special Requests   Final    BOTTLES DRAWN AEROBIC AND ANAEROBIC Blood Culture results may not be optimal due to an excessive volume of blood received in culture bottles   Culture   Final    NO GROWTH 2 DAYS Performed at [facility], [address], [place], [place] [address]    Report Status PENDING  Incomplete  SARS CORONAVIRUS 2 (TAT 6-24 HRS) Nasopharyngeal Nasopharyngeal Swab     Status: None   Collection Time: 07/09/20  6:35 PM   Specimen: Nasopharyngeal Swab  Result Value Ref Range Status   SARS Coronavirus 2 NEGATIVE NEGATIVE Final    Comment: (NOTE) SARS-CoV-2 target nucleic acids are NOT DETECTED.  The SARS-CoV-2 RNA is generally detectable in upper and lower respiratory specimens during the acute phase of infection. Negative results do not preclude SARS-CoV-2 infection, do not rule out co-infections with other pathogens, and should not be used as the sole basis for treatment or other patient management decisions. Negative results must be combined with clinical observations, patient history, and epidemiological information. The expected result is Negative.  Fact Sheet for Patients: SugarRoll.be  Fact Sheet for Healthcare Providers: https://www.woods-mathews.com/  This test is not yet approved or cleared by the [place] FDA and  has been authorized for detection and/or diagnosis of SARS-CoV-2 by FDA under an Emergency Use Authorization  (EUA). This EUA will remain  in effect (meaning this test can be used) for the duration of the COVID-19 declaration under Se ction 564(b)(1) of the Act, 21 U.S.C. section 360bbb-3(b)(1), unless the authorization  is terminated or revoked sooner.  Performed at [facility], [place] [address], [place], [place] [address]   Aerobic/Anaerobic Culture (surgical/deep wound)     Status: None (Preliminary result)   Collection Time: 07/10/20  3:00 PM   Specimen: Fluid; Bile  Result Value Ref Range Status   Specimen Description   Final    FLUID Performed at [facility], [address], [place], [place] [address]    Special Requests   Final    GALL BLADDER FLUID Performed at [facility], [place], [place] [address]    Gram Stain NO WBC SEEN NO ORGANISMS SEEN   Final   Culture   Final    NO GROWTH < 12 HOURS Performed at [facility], [place] [address], [place], [place] [address]    Report Status PENDING  Incomplete     Scheduled Meds: . Chlorhexidine Gluconate Cloth  6 each Topical Daily  . fluticasone  2 spray Each Nare Daily  . insulin aspart  0-9 Units Subcutaneous TID WC   Continuous Infusions: . ceFEPime (MAXIPIME) IV 2 g (07/11/20 0929)  . metronidazole 500 mg (07/11/20 1016)     LOS: 2 days   Cherene Altes, MD [facility] Office  [phone] Pager - Text Page per Amion  If 7PM-7AM, please contact night-coverage per Amion 07/11/2020, 10:38 AM

## 2020-07-11 NOTE — Progress Notes (Addendum)
[facility] SURGICAL PROGRESS NOTE (cpt [phone])  Hospital Day(s): 2.   Interval History: Patient seen and examined, no acute events or new complaints overnight. Patient reports his abdominal pain has significantly improved. He denies fever, chills, nausea, emesis. Previously seen leukocytosis has improved, now 10.7K. Renal function improving, sCr - 2.16. Hyperbilirubinemia improving as well to 1.3. Mild hypokalemia to 3.3. He did undergo percutaneous cholecystostomy tube placement yesterday (02/24) with IR. Output recorded at 275 ccs; bilious. Cx are pending. He continues on Cefepime and Flagyl. Currently on CLD and tolerating well.   Review of Systems:  Constitutional: denies fever, chills  HEENT: denies cough or congestion  Respiratory: denies any shortness of breath  Cardiovascular: denies chest pain or palpitations  Gastrointestinal: denies abdominal pain, N/V, or diarrhea/and bowel function as per interval history Genitourinary: denies burning with urination or urinary frequency  Vital signs in last 24 hours: [min-max] current  Temp:  [97.9 F (36.6 C)-99.4 F (37.4 C)] 98.3 F (36.8 C) (02/25 0806) Pulse Rate:  [46-113] 88 (02/25 0806) Resp:  [18-26] 18 (02/25 0806) BP: (84-141)/(58-91) 132/78 (02/25 0806) SpO2:  [86 %-100 %] 95 % (02/25 0806) Weight:  [255 lb (115.7 kg)-266 lb 4.8 oz (120.8 kg)] 266 lb 4.8 oz (120.8 kg) (02/25 0302)     Height: 6\' 4"  (193 cm) Weight: 266 lb 4.8 oz (120.8 kg) BMI (Calculated): 32.43   Intake/Output last 2 shifts:  02/24 0701 - 02/25 0700 In: 2010 [P.O.:360; I.V.:950; IV Piggyback:700] Out: 2950 [Urine:2675; Drains:275]   Physical Exam:  Constitutional: alert, cooperative and no distress  HENT: normocephalic without obvious abnormality  Eyes: PERRL, EOM's grossly intact and symmetric  Respiratory: breathing non-labored at rest  Cardiovascular: regular rate and sinus rhythm  Gastrointestinal: Abdomen is protuberant consistent  with degree of obesity, soft, tenderness improved, no rebound/guarding. Cholecystostomy tube in place, bilious output  Musculoskeletal: no edema or wounds, motor and sensation grossly intact, NT    Labs:  CBC Latest Ref Rng & Units 07/11/2020 07/10/2020 07/09/2020  WBC 4.0 - 10.5 K/uL 10.7(H) 16.4(H) 27.0(H)  Hemoglobin 13.0 - 17.0 g/dL 13.1 14.6 18.0(H)  Hematocrit 39.0 - 52.0 % 38.6(L) 43.0 52.0  Platelets 150 - 400 K/uL 254 238 310   CMP Latest Ref Rng & Units 07/11/2020 07/10/2020 07/09/2020  Glucose 70 - 99 mg/dL 126(H) 131(H) 207(H)  BUN 8 - 23 mg/dL 44(H) 46(H) 39(H)  Creatinine 0.61 - 1.24 mg/dL 2.16(H) 3.13(H) 3.12(H)  Sodium 135 - 145 mmol/L 135 136 135  Potassium 3.5 - 5.1 mmol/L 3.3(L) 3.6 3.9  Chloride 98 - 111 mmol/L 106 104 102  CO2 22 - 32 mmol/L 22 21(L) 18(L)  Calcium 8.9 - 10.3 mg/dL 7.9(L) 8.4(L) 9.5  Total Protein 6.5 - 8.1 g/dL 5.8(L) 6.6 8.3(H)  Total Bilirubin 0.3 - 1.2 mg/dL 1.3(H) 2.2(H) 2.0(H)  Alkaline Phos 38 - 126 U/L 115 103 88  AST 15 - 41 U/L 67(H) 76(H) 48(H)  ALT 0 - 44 U/L 47(H) 53(H) 52(H)     Imaging studies: No new pertinent imaging studies   Assessment/Plan: (ICD-10's: K81.0) 65 y.o. male with resolved sepsis secondary to acute cholecystitis s/p percutaneous cholecystostomy tube, complicated by new onset Afib with RVR.               - Continue percutaneous cholecysostomy tube; he understands he will need this for 6-8 weeks while planning interval cholecystectomy and outpatient optimization. Flush daily with 5 ccs NS.              -  Okay to ADAT; recommend limiting fatty food intake             - Continue IV Abx (Cefepime + Falgyl): will need at least 10 days PO for home             - Monitor abdominal examination             - Pain control prn; antiemetics prn             - Monitor leukocytosis; improving             - Monitor hyperbilirubinemia; likely secondary to inflammation from cholecystitis. If this worsens would need MRCP to rule out  obstruction   - Discharge Planning: General surgery issues resolving, nothing further to add, we will sign off but be available. He can follow up in surgery clinic in ~3 weeks. Can also follow with [facility]. ABx and drain instructions as above.    All of the above findings and recommendations were discussed with the patient, and all of patient's questions were answered to his expressed satisfaction.  -- [name], PA-C [facility] 07/11/2020, 8:44 AM [phone] M-F: 7am - 4pm  I saw and evaluated the patient.  I agree with the above documentation, exam, and plan, which I have edited where appropriate. [name]  8:44 AM

## 2020-07-12 DIAGNOSIS — A419 Sepsis, unspecified organism: Secondary | ICD-10-CM | POA: Diagnosis not present

## 2020-07-12 DIAGNOSIS — I959 Hypotension, unspecified: Secondary | ICD-10-CM | POA: Diagnosis not present

## 2020-07-12 DIAGNOSIS — I4891 Unspecified atrial fibrillation: Secondary | ICD-10-CM | POA: Diagnosis not present

## 2020-07-12 LAB — COMPREHENSIVE METABOLIC PANEL
ALT: 47 U/L — ABNORMAL HIGH (ref 0–44)
AST: 67 U/L — ABNORMAL HIGH (ref 15–41)
Albumin: 2.7 g/dL — ABNORMAL LOW (ref 3.5–5.0)
Alkaline Phosphatase: 106 U/L (ref 38–126)
Anion gap: 8 (ref 5–15)
BUN: 37 mg/dL — ABNORMAL HIGH (ref 8–23)
CO2: 25 mmol/L (ref 22–32)
Calcium: 8.2 mg/dL — ABNORMAL LOW (ref 8.9–10.3)
Chloride: 107 mmol/L (ref 98–111)
Creatinine, Ser: 1.47 mg/dL — ABNORMAL HIGH (ref 0.61–1.24)
GFR, Estimated: 53 mL/min — ABNORMAL LOW (ref >60)
Glucose, Bld: 113 mg/dL — ABNORMAL HIGH (ref 70–99)
Potassium: 3.5 mmol/L (ref 3.5–5.1)
Sodium: 140 mmol/L (ref 135–145)
Total Bilirubin: 1.2 mg/dL (ref 0.3–1.2)
Total Protein: 6.2 g/dL — ABNORMAL LOW (ref 6.5–8.1)

## 2020-07-12 LAB — GLUCOSE, CAPILLARY
Glucose-Capillary: 104 mg/dL — ABNORMAL HIGH (ref 70–99)
Glucose-Capillary: 132 mg/dL — ABNORMAL HIGH (ref 70–99)

## 2020-07-12 LAB — CBC
HCT: 38.3 % — ABNORMAL LOW (ref 39.0–52.0)
Hemoglobin: 12.9 g/dL — ABNORMAL LOW (ref 13.0–17.0)
MCH: 29.5 pg (ref 26.0–34.0)
MCHC: 33.7 g/dL (ref 30.0–36.0)
MCV: 87.4 fL (ref 80.0–100.0)
Platelets: 268 10*3/uL (ref 150–400)
RBC: 4.38 MIL/uL (ref 4.22–5.81)
RDW: 12.8 % (ref 11.5–15.5)
WBC: 7.8 10*3/uL (ref 4.0–10.5)
nRBC: 0 % (ref 0.0–0.2)

## 2020-07-12 MED ORDER — SODIUM CHLORIDE 0.9 % IV SOLN
2.0000 g | Freq: Three times a day (TID) | INTRAVENOUS | Status: DC
  Administered 2020-07-12: 2 g via INTRAVENOUS
  Filled 2020-07-12 (×3): qty 2

## 2020-07-12 MED ORDER — TRAMADOL HCL 50 MG PO TABS: 50.0000 mg | ORAL_TABLET | Freq: Four times a day (QID) | ORAL | 0 refills | Status: DC | PRN

## 2020-07-12 MED ORDER — AMOXICILLIN-POT CLAVULANATE 875-125 MG PO TABS
1.0000 | ORAL_TABLET | Freq: Two times a day (BID) | ORAL | Status: DC
  Administered 2020-07-12: 1 via ORAL
  Filled 2020-07-12: qty 1

## 2020-07-12 MED ORDER — METOPROLOL TARTRATE 25 MG PO TABS: 12.5000 mg | ORAL_TABLET | Freq: Two times a day (BID) | ORAL | 0 refills | Status: DC

## 2020-07-12 MED ORDER — AMOXICILLIN-POT CLAVULANATE 875-125 MG PO TABS: 1.0000 | ORAL_TABLET | Freq: Two times a day (BID) | ORAL | 0 refills | Status: DC

## 2020-07-12 MED ORDER — ACETAMINOPHEN 325 MG PO TABS: 325.0000 mg | ORAL_TABLET | Freq: Four times a day (QID) | ORAL | Status: DC | PRN

## 2020-07-12 NOTE — Plan of Care (Signed)

## 2020-07-12 NOTE — Discharge Summary (Signed)
DISCHARGE SUMMARY  Sanjuan Sawa  MR#: [id number]  DOB:[birth year]  Date of Admission: 07/09/2020 Date of Discharge: 07/12/2020  Attending Physician:Conlee Sliter Hennie Duos, MD  Patient's TSV:[id number], Devonne Doughty, DO  Consults: Gen Surgery  IR   Disposition: D/C home    Follow-up Appts:  Follow-up Information    [name], MD Follow up in 3 week(s).   Specialty: General Surgery Contact information: [address] [place] [address] [phone]        [place] Follow up.   Why: [facility] should call you to arrange for an outpatient follow up appointment to evaluate your drain. If they do not call you call this number to request an appointment. Dr. Corrie Mckusick placed your drain.  Contact information: [place] [address] [phone]        [name], DO Follow up in 1 week(s).   Specialty: Family Medicine Contact information: [place] [address] [phone]               D/C Instructions - Issues for Follow-up: -Recheck renal function  -Continue percutaneous cholecysostomy tube; he understands he will need this for 6-8 weeks while planning interval cholecystectomy and outpatient optimization. Flush daily with 5 ccs NS -Follow up in Surgery Clinic in ~3 weeks -Follow with [facility] -assess HR and rhythm given transient afib w/ RVR during admit  -f/u of TSH in 4-6 weeks suggested   Discharge Diagnoses: Acute cholecystitis Severe sepsis POA Acute kidney failure Acute urinary retention - hx of Bladder CA Newly diagnosed transient atrial fibrillation with RVR HTN HLD DM2 Obesity - Body mass index is 32.42 kg/m.  Initial presentation: 65yo with a history of HTN, obesity, DM 2, and HLD who presented to the ER with complaints of severe right upper quadrant abdominal pain for 3-4 days.  This was  preceded by nausea lasting an additional 2 to 3 days and persisting after the appearance of his pain.  In the ER he was found to have a blood pressure of 83/56.  CT abdomen revealed evidence of acute cholecystitis as well as severe fatty liver with early cirrhosis.  Antibiotics were initiated and general surgery was consulted.  Hospital Course:  Acute cholecystitis with severe sepsis POA Continue broad antibiotic coverage w/ transition to augmentin at time of d/c for 10 days of tx - General surgery followed during admission - IR performed percutaneous cholecystostomy 2/24 - sepsis physiology much improved with volume expansion - d/c home 2/26 - f/u w/ Gen Surg as outpt for timing of surgical GB removal - f/u w/ IR regarding care of his drain   Acute kidney failure Due to sepsis with probable component of ATN - baseline creatinine approximately 1.1 - improved w/ volume resuscitation - baseline crt 1.14 in 2019   Acute urinary retention - hx of Bladder CA Pt denies hx of this prior to this illness - foley discontinued 2/26 and pt able to urinate > 100cc prior to being d/c home   Newly diagnosed atrial fibrillation with RVR Felt to be a consequence of sepsis - monitored with supportive care without anticoagulation during admit - spontaneously converted to NSR early in admit, and remained in NSR for rest of stay - BB initiated during hospitalization - f/u as outpt - TSH was low, but FT4 normal so pt not likely to have hyperthyroidism - routine f/u of TSH in 4-6 weeks suggested  HTN Blood pressure stable at time of d/c  HLD Resumed usual medical tx at time of d/c   DM2 CBG controlled  Obesity - Body mass index is 32.42 kg/m.   Allergies as of 07/12/2020      Reactions   Cyclobenzaprine Other (See Comments)   Not improving pain and makes him agressive.   Lisinopril Cough      Medication List    STOP taking these medications   valsartan 80 MG tablet Commonly known as: Diovan      TAKE these medications   acetaminophen 325 MG tablet Commonly known as: TYLENOL Take 1 tablet (325 mg total) by mouth every 6 (six) hours as needed for mild pain, fever or headache (or Fever >/= 101). What changed:   medication strength  how much to take  when to take this  reasons to take this   amoxicillin-clavulanate 875-125 MG tablet Commonly known as: AUGMENTIN Take 1 tablet by mouth every 12 (twelve) hours.   calcium carbonate 500 MG chewable tablet Commonly known as: TUMS - dosed in mg elemental calcium Chew 2 tablets by mouth daily as needed for indigestion or heartburn.   diphenhydramine-acetaminophen 25-500 MG Tabs tablet Commonly known as: TYLENOL PM Take 1-2 tablets by mouth at bedtime as needed (sleep).   metoprolol tartrate 25 MG tablet Commonly known as: LOPRESSOR Take 0.5 tablets (12.5 mg total) by mouth 2 (two) times daily.   Ozempic (0.25 or 0.5 MG/DOSE) 2 MG/1.5ML Sopn Generic drug: Semaglutide(0.25 or 0.5MG /DOS) Inject 1 mg into the skin once a week.   pravastatin 20 MG tablet Commonly known as: PRAVACHOL Take 1 tablet (20 mg total) by mouth daily.   traMADol 50 MG tablet Commonly known as: ULTRAM Take 1 tablet (50 mg total) by mouth every 6 (six) hours as needed for moderate pain.       Day of Discharge BP (!) 146/91 (BP Location: Left Arm)    Pulse 73    Temp 97.9 F (36.6 C) (Oral)    Resp 16    Ht 6\' 4"  (1.93 m)    Wt 118.6 kg    SpO2 99%    BMI 31.83 kg/m   Physical Exam: General: No acute respiratory distress Lungs: Clear to auscultation bilaterally without wheezes or crackles Cardiovascular: Regular rate and rhythm without murmur gallop or rub normal S1 and S2 Abdomen: Nontender, nondistended, soft, bowel sounds positive, no rebound, no ascites, no appreciable mass Extremities: No significant cyanosis, clubbing, or edema bilateral lower extremities  Basic Metabolic Panel: Recent Labs  Lab 07/09/20 1600 07/09/20 1913  07/10/20 0258 07/11/20 0603 07/12/20 0438  NA 135  --  136 135 140  K 3.9  --  3.6 3.3* 3.5  CL 102  --  104 106 107  CO2 18*  --  21* 22 25  GLUCOSE 207*  --  131* 126* 113*  BUN 39*  --  46* 44* 37*  CREATININE 3.12*  --  3.13* 2.16* 1.47*  CALCIUM 9.5  --  8.4* 7.9* 8.2*  MG  --  2.0  --  2.0  --     Liver Function Tests: Recent Labs  Lab 07/09/20 1600 07/10/20 0258 07/11/20 0603 07/12/20 0438  AST 48* 76* 67* 67*  ALT 52* 53* 47* 47*  ALKPHOS 88 103 115 106  BILITOT 2.0* 2.2* 1.3* 1.2  PROT 8.3* 6.6 5.8* 6.2*  ALBUMIN 3.9 3.1* 2.7* 2.7*   Recent Labs  Lab 07/09/20 1600  LIPASE 24  CBC: Recent Labs  Lab 07/09/20 1600 07/10/20 0258 07/11/20 0603 07/12/20 0438  WBC 27.0* 16.4* 10.7* 7.8  HGB 18.0* 14.6 13.1 12.9*  HCT 52.0 43.0 38.6* 38.3*  MCV 86.4 88.5 88.1 87.4  PLT 310 238 254 268     CBG: Recent Labs  Lab 07/11/20 1210 07/11/20 1654 07/11/20 2039 07/12/20 0723 07/12/20 1204  GLUCAP 138* 131* 110* 104* 132*    Recent Results (from the past 240 hour(s))  Culture, blood (Routine X 2) w Reflex to ID Panel     Status: None (Preliminary result)   Collection Time: 07/09/20  4:44 PM   Specimen: BLOOD  Result Value Ref Range Status   Specimen Description BLOOD BLOOD RIGHT ARM  Final   Special Requests   Final    BOTTLES DRAWN AEROBIC AND ANAEROBIC Blood Culture results may not be optimal due to an excessive volume of blood received in culture bottles   Culture   Final    NO GROWTH 3 DAYS Performed at [facility], [address] [place], [place] [address]    Report Status PENDING  Incomplete  Culture, blood (Routine X 2) w Reflex to ID Panel     Status: None (Preliminary result)   Collection Time: 07/09/20  4:50 PM   Specimen: BLOOD  Result Value Ref Range Status   Specimen Description BLOOD BLOOD LEFT ARM  Final   Special Requests   Final    BOTTLES DRAWN AEROBIC AND ANAEROBIC Blood Culture results may not be optimal due to an  excessive volume of blood received in culture bottles   Culture   Final    NO GROWTH 3 DAYS Performed at [facility], [address] [place], [place] [address]    Report Status PENDING  Incomplete  SARS CORONAVIRUS 2 (TAT 6-24 HRS) Nasopharyngeal Nasopharyngeal Swab     Status: None   Collection Time: 07/09/20  6:35 PM   Specimen: Nasopharyngeal Swab  Result Value Ref Range Status   SARS Coronavirus 2 NEGATIVE NEGATIVE Final    Comment: (NOTE) SARS-CoV-2 target nucleic acids are NOT DETECTED.  The SARS-CoV-2 RNA is generally detectable in upper and lower respiratory specimens during the acute phase of infection. Negative results do not preclude SARS-CoV-2 infection, do not rule out co-infections with other pathogens, and should not be used as the sole basis for treatment or other patient management decisions. Negative results must be combined with clinical observations, patient history, and epidemiological information. The expected result is Negative.  Fact Sheet for Patients: SugarRoll.be  Fact Sheet for Healthcare Providers: https://www.woods-mathews.com/  This test is not yet approved or cleared by the [place] FDA and  has been authorized for detection and/or diagnosis of SARS-CoV-2 by FDA under an Emergency Use Authorization (EUA). This EUA will remain  in effect (meaning this test can be used) for the duration of the COVID-19 declaration under Se ction 564(b)(1) of the Act, 21 U.S.C. section 360bbb-3(b)(1), unless the authorization is terminated or revoked sooner.  Performed at [facility], [place] [address] [place], [place] [address]   Aerobic/Anaerobic Culture (surgical/deep wound)     Status: None (Preliminary result)   Collection Time: 07/10/20  3:00 PM   Specimen: Fluid; Bile  Result Value Ref Range Status   Specimen Description   Final    FLUID Performed at [facility], [address], [place],  [address]    Special Requests   Final    GALL BLADDER FLUID Performed  at [facility], [address] [place], [place] [address]    Gram Stain   Final    NO WBC SEEN NO ORGANISMS SEEN Performed at [facility], [place] [address], [place], [place] [address]    Culture   Final    CULTURE REINCUBATED FOR BETTER GROWTH NO ANAEROBES ISOLATED; CULTURE IN PROGRESS FOR 5 DAYS    Report Status PENDING  Incomplete      Time spent in discharge (includes decision making & examination of pt): 35 minutes  07/12/2020, 3:05 PM   Cherene Altes, MD [facility] Office  [phone]

## 2020-07-12 NOTE — Discharge Instructions (Signed)
Biliary Drainage Catheter Home Guide  Flush your drain catheter daily with 5 ccs NS.  A biliary drainage catheter is a thin, flexible tube that is inserted through your skin into the bile ducts in your liver. Bile is a thick yellow or green fluid that helps digest fat in foods. The purpose of a biliary drainage catheter is to keep bile from backing up into your liver. There are three kinds of biliary drainage catheter systems. Follow general guidelines and instructions for your specific drainage system. Your health care provider will explain how to:  Inspect your drainage catheter.  Flush your drainage catheter.  Attach and empty your collection bag.  Change your bandage (dressing). What are the risks? The biliary drainage catheter system is generally safe. However, problems may occur, including:  Infection.  Bleeding.  A dislodged or blocked catheter. Supplies needed: Supplies you will need to change your dressing:  Mild soap and warm water.  A clean cloth.  Split gauze pads, 4 x 4 inches (10 x 10 cm) to use as a dressing sponge.  Gauze pads, 4 x 4 inches (10 x 10 cm), or adhesive dressing cover.  Paper tape. Supplies you will need to flush your drainage catheter:  Alcohol swab.  10 mL prefilled normal salt-water (saline) syringe. Supplies you will need to empty your collection bag:  Drainage container.  Tissue or disposable napkin.  Measuring container, if needed. How to care for your drainage catheter How to inspect your drainage catheter 1. Check your dressing to make sure that it is dry and clean. Change your dressing if it comes loose or gets wet or dirty. 2. Check your collection bag to make sure that drainage fluid is flowing into the bag well. Note the color and amount compared with the color and amount on other days. 3. Check your drainage catheter and bag for any cracks or kinks in the tubing. How to flush your drainage catheter Biliary drainagecatheters  should be flushed daily, or as often as told by your health care provider. The end of your drainage catheter is closed using an IV cap. You can connect the syringe directly to the IV cap. 1. Wash your hands with soap and water for at least 20 seconds. 2. If your drainage catheter has an external valve (stopcock) attached to it, turn the stopcock toward the collection bag. This will allow the saline to flow in the direction of your body. 3. Clean the IV cap with an alcohol swab. 4. Screw the tip of a 10 mL normal saline syringe onto the IV cap. 5. Inject the saline for 5-10 seconds. If you feel resistance while injecting, stop right away. Do not pull back on the plunger. Pulling back on the plunger could increase your risk of infection. 6. Remove the syringe from the cap. Turn the stopcock so that fluid flows from your body into your collection bag. You may notice more fluid flowing into the bag after you have completed the flush. How to attach a bag to your drainage catheter If you are having trouble with your internal biliary drain, your health care provider may direct you to use bag drainage until you can be seen to fix the problem. For this reason, you should always have a collection bag and connecting tubing at home. If you do not have these supplies, remember to ask for them at your next appointment. 1. Remove the bag and the connecting tubing from their packaging. 2. Connect the funnel end of the  tubing to the bag's cone-shaped stem. 3. Remove the IV cap from the biliary drain. To do this, unscrew the cap and replace it with the screw-on end of the tubing. 4. Save the IV cap in a plastic storage bag that can be sealed. How to empty your collection bag Empty the collection bag whenever it becomes ? full. Also empty it before you go to sleep. Most collection bags have a drainage valve at the bottom that allows them to be emptied easily. 1. Wash your hands with soap and water for at least 20  seconds. 2. Hold the collection bag over the toilet, basin, or collection container. Use a measuring container if your health care provider told you to measure the drainage. 3. Unscrew the valve to open it, and allow the bag to drain. 4. Close the valve securely to avoid leakage. 5. Use a tissue or disposable napkin to wipe the valve clean. 6. Measure the amount of drainage and then dispose of the fluid in the toilet. 7. Wash the measuring container with soap and water. 8. Record the amount of drainage as told. How to change your dressing The dressing over your drainage catheter may be changed weekly, or more often if needed to keep the dressing dry and clean. Your health care provider will tell you how often to change your dressing. 1. Wash your hands with soap and water for at least 20 seconds. 2. Gently remove your old dressing. Avoid using scissors to remove the dressing because they may damage the drainage catheter. 3. Wash the skin around your insertion site with mild soap and warm water. Then, rinse well and pat the area dry with a clean cloth. 4. Check the skin around your drainage catheter for redness or swelling, or for yellow or green fluid that has a bad smell. Also check for a rash, warmth, or skin breakdown. 5. If your drainage catheter was stitched (sutured) to your skin, inspect the suture to make sure it is still anchored in your skin. 6. Do not apply creams, ointments, or alcohol to the site. Allow your skin to air-dry completely before you apply a new dressing. 7. Place your drainage catheter through the slit in a dressing sponge. The dressing sponge should slide under the disk that holds the drainage catheter in place. 8. Cover the drainage catheter and the dressing sponge. ? Cover the catheter and sponge with a 4 x 4 inch (10 x 10 cm) gauze. The drainage catheter should rest on the gauze and not on your skin. Then tape the dressing to your skin. ? If told, use an adhesive  dressing covering over the top of the dressing in place of the gauze and tape. 9. Wash your hands with soap and water for at least 20 seconds.   Follow these instructions at home Keep all follow-up visits as told by your health care provider. This is important. Contact a health care provider if:  Your pain gets worse after it had improved, and it is not relieved with pain medicines.  You have any questions about caring for your drainage catheter or collection bag.  The skin around your catheter insertion site breaks down.  You have any of these signs of infection around your catheter insertion site: ? More redness, swelling, or pain. ? Fluid or blood. ? Warmth. ? Pus or a bad smell. Get help right away if:  You have a fever or chills.  You have bile leaking around your drainage catheter.  Your  drainage catheter becomes blocked or clogged.  Your drainage catheter comes out. Summary  Bile is a thick yellow or green fluid that helps digest fat in foods. The purpose of a biliary drainage catheter is to keep bile from backing up into your liver.  Check the skin around your drainage catheter for redness or swelling, or for yellow or green fluid that has a bad smell. Also check for a rash or skin breakdown.  Biliary drainagecatheters should be flushed daily, or as often as told by your health care provider.  Empty the collection bag whenever it becomes ? full. Also empty it before you go to sleep. This information is not intended to replace advice given to you by your health care provider. Make sure you discuss any questions you have with your health care provider. Document Revised: 06/26/2019 Document Reviewed: 02/21/2019 Elsevier Patient Education  [place].

## 2020-07-13 ENCOUNTER — Other Ambulatory Visit: Payer: Self-pay

## 2020-07-13 ENCOUNTER — Encounter: Payer: Self-pay | Admitting: Emergency Medicine

## 2020-07-13 ENCOUNTER — Emergency Department
Admission: EM | Admit: 2020-07-13 | Discharge: 2020-07-13 | Disposition: A | Discharge status: Home / Self Care | Payer: Medicare Other | Attending: Emergency Medicine | Admitting: Emergency Medicine

## 2020-07-13 DIAGNOSIS — T85520A Displacement of bile duct prosthesis, initial encounter: Secondary | ICD-10-CM | POA: Diagnosis not present

## 2020-07-13 DIAGNOSIS — I10 Essential (primary) hypertension: Secondary | ICD-10-CM | POA: Diagnosis not present

## 2020-07-13 DIAGNOSIS — T85520S Displacement of bile duct prosthesis, sequela: Secondary | ICD-10-CM

## 2020-07-13 DIAGNOSIS — Z4803 Encounter for change or removal of drains: Secondary | ICD-10-CM | POA: Insufficient documentation

## 2020-07-13 DIAGNOSIS — Z79899 Other long term (current) drug therapy: Secondary | ICD-10-CM | POA: Insufficient documentation

## 2020-07-13 DIAGNOSIS — Z8551 Personal history of malignant neoplasm of bladder: Secondary | ICD-10-CM | POA: Insufficient documentation

## 2020-07-13 DIAGNOSIS — E1142 Type 2 diabetes mellitus with diabetic polyneuropathy: Secondary | ICD-10-CM | POA: Insufficient documentation

## 2020-07-13 DIAGNOSIS — Z760 Encounter for issue of repeat prescription: Secondary | ICD-10-CM

## 2020-07-13 MED ORDER — AMOXICILLIN-POT CLAVULANATE 875-125 MG PO TABS
1.0000 | ORAL_TABLET | Freq: Once | ORAL | Status: AC
  Administered 2020-07-13: 1 via ORAL
  Filled 2020-07-13: qty 1

## 2020-07-13 NOTE — ED Notes (Signed)
Approximately 47ml of bile-colored fluid in drainage bag. New dressing was placed over site. Dr. Jacqualine Code flushed site and tubing. Patient tolerated procedure well.

## 2020-07-13 NOTE — ED Provider Notes (Signed)
[facility] Emergency Department Provider Note   ____________________________________________   Event Date/Time   First MD Initiated Contact with Patient 07/13/20 1423     (approximate)  I have reviewed the triage vital signs and the nursing notes.   HISTORY  Chief Complaint Patient Education    HPI Richard Oconnell is a 65 y.o. male the history of type 2 diabetes and recent cholecystitis with biliary drain  Patient reports that  he left the hospital Friday but did not receive teaching on how to flush his biliary drain.  He also reports his prescription for Augmentin went pharmacy that is closed, but he will be able to get tomorrow  He is not have any pain or discomfort no fevers or chills.  He is feeling much better.  No suspicious to follow-up with general surgery, but is here as he is just not sure how to empty the bag or flush the biliary tube  Past Medical History:  Diagnosis Date  . Cancer [facility])    Bladder Cancer  . Erectile dysfunction associated with type 2 diabetes mellitus ([place]) 05/26/2016  . Gross hematuria 11/16/2016  . Hypertension 06/11/2015  . Type 2 diabetes mellitus with diabetic polyneuropathy, without long-term current use of insulin ([place]) 06/11/2015    Patient Active Problem List   Diagnosis Date Noted  . Metabolic syndrome 76/28/3151  . Acute cholecystitis 07/09/2020  . Class 1 obesity due to excess calories with serious comorbidity and body mass index (BMI) of 33.0 to 33.9 in adult 07/06/2019  . Malignant neoplasm of urinary bladder ([place]) 12/06/2018  . Overweight (BMI 25.0-29.9) 07/04/2018  . Hyperlipidemia associated with type 2 diabetes mellitus ([place]) 11/30/2016  . 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 Gross hematuria 11/16/2016  . Erectile dysfunction associated with type 2 diabetes mellitus ([place]) 05/26/2016  . Diabetes mellitus with coincident hypertension ([place]) 06/11/2015  . Type 2 diabetes mellitus with diabetic  polyneuropathy, without long-term current use of insulin ([place]) 06/11/2015    Past Surgical History:  Procedure Laterality Date  . boil lanceted     hand  . COLONOSCOPY WITH PROPOFOL N/A 07/03/2020   Procedure: COLONOSCOPY WITH PROPOFOL;  Surgeon: Jonathon Bellows, MD;  Location: [facility];  Service: Gastroenterology;  Laterality: N/A;  . CYSTOSCOPY W/ RETROGRADES Bilateral 12/06/2016   Procedure: CYSTOSCOPY WITH RETROGRADE PYELOGRAM;  Surgeon: Hollice Espy, MD;  Location: [facility] ORS;  Service: Urology;  Laterality: Bilateral;  . CYSTOSCOPY W/ RETROGRADES Bilateral 06/27/2017   Procedure: CYSTOSCOPY WITH RETROGRADE PYELOGRAM;  Surgeon: Hollice Espy, MD;  Location: [facility] ORS;  Service: Urology;  Laterality: Bilateral;  . CYSTOSCOPY WITH BIOPSY N/A 06/27/2017   Procedure: CYSTOSCOPY WITH Bladder BIOPSY;  Surgeon: Hollice Espy, MD;  Location: [facility] ORS;  Service: Urology;  Laterality: N/A;  . CYSTOSCOPY WITH BIOPSY N/A 10/24/2017   Procedure: CYSTOSCOPY WITH Bladder BIOPSY;  Surgeon: Hollice Espy, MD;  Location: [facility] ORS;  Service: Urology;  Laterality: N/A;  . ingrown  Bilateral    ingrown toenail  . IR BILIARY DRAIN PLACEMENT WITH CHOLANGIOGRAM  07/10/2020  . TRANSURETHRAL RESECTION OF BLADDER TUMOR N/A 12/06/2016   Procedure: TRANSURETHRAL RESECTION OF BLADDER TUMOR (TURBT) (2-5cm) CLOT EVACUATION;  Surgeon: Hollice Espy, MD;  Location: [facility] ORS;  Service: Urology;  Laterality: N/A;  . TRANSURETHRAL RESECTION OF BLADDER TUMOR N/A 06/27/2017   Procedure: TRANSURETHRAL RESECTION OF BLADDER TUMOR (TURBT);  Surgeon: Hollice Espy, MD;  Location: [facility] ORS;  Service: Urology;  Laterality: N/A;  . TRANSURETHRAL RESECTION OF BLADDER TUMOR WITH MITOMYCIN-C  N/A 01/18/2017   Procedure: TRANSURETHRAL RESECTION OF BLADDER TUMOR WITH MITOMYCIN-C-(SMALL);  Surgeon: Hollice Espy, MD;  Location: [facility] ORS;  Service: Urology;  Laterality: N/A;    Prior to Admission medications   Medication Sig Start  Date End Date Taking? Authorizing Provider  acetaminophen (TYLENOL) 325 MG tablet Take 1 tablet (325 mg total) by mouth every 6 (six) hours as needed for mild pain, fever or headache (or Fever >/= 101). 07/12/20   Cherene Altes, MD  amoxicillin-clavulanate (AUGMENTIN) 875-125 MG tablet Take 1 tablet by mouth every 12 (twelve) hours. 07/12/20   Cherene Altes, MD  calcium carbonate (TUMS - DOSED IN MG ELEMENTAL CALCIUM) 500 MG chewable tablet Chew 2 tablets by mouth daily as needed for indigestion or heartburn.    [provider]  diphenhydramine-acetaminophen (TYLENOL PM) 25-500 MG TABS tablet Take 1-2 tablets by mouth at bedtime as needed (sleep).     [provider]  metoprolol tartrate (LOPRESSOR) 25 MG tablet Take 0.5 tablets (12.5 mg total) by mouth 2 (two) times daily. 07/12/20   Cherene Altes, MD  OZEMPIC, 0.25 OR 0.5 MG/DOSE, 2 MG/1.5ML SOPN Inject 1 mg into the skin once a week. 05/30/20   Karamalegos, Devonne Doughty, DO  pravastatin (PRAVACHOL) 20 MG tablet Take 1 tablet (20 mg total) by mouth daily. 05/30/20   Karamalegos, Devonne Doughty, DO  traMADol (ULTRAM) 50 MG tablet Take 1 tablet (50 mg total) by mouth every 6 (six) hours as needed for moderate pain. 07/12/20   Cherene Altes, MD    Allergies Cyclobenzaprine and Lisinopril  Family History  Problem Relation Age of Onset  . Heart attack Mother   . Heart disease Mother   . Parkinson's disease Father   . Heart attack Maternal Grandfather   . Heart disease Maternal Grandfather   . Heart attack Maternal Aunt   . Heart attack Maternal Grandmother   . Prostate cancer Neg Hx   . Bladder Cancer Neg Hx   . Kidney cancer Neg Hx     Social History Social History   Tobacco Use  . Smoking status: Never Smoker  . Smokeless tobacco: Never Used  . Tobacco comment: once a while had one cigar >10 years ago  Vaping Use  . Vaping Use: Never used  Substance Use Topics  . Alcohol use: No    Alcohol/week: 0.0  standard drinks  . Drug use: No    Review of Systems Constitutional: No fever/chills Cardiovascular: Denies chest pain. Respiratory: Denies shortness of breath. Gastrointestinal: See HPI Genitourinary: Negative for dysuria. Musculoskeletal: Negative for back pain. Skin: Negative for rash. Neurological: Negative for headaches, areas of focal weakness or numbness.    ____________________________________________   PHYSICAL EXAM:  VITAL SIGNS: ED Triage Vitals  Enc Vitals Group     BP 07/13/20 1424 (!) 162/108     Pulse Rate 07/13/20 1424 84     Resp 07/13/20 1424 20     Temp 07/13/20 1424 97.7 F (36.5 C)     Temp Source 07/13/20 1424 Oral     SpO2 07/13/20 1424 100 %     Weight 07/13/20 1413 260 lb 2.3 oz (118 kg)     Height 07/13/20 1413 6\' 4"  (1.93 m)     Head Circumference --      Peak Flow --      Pain Score 07/13/20 1413 0     Pain Loc --      Pain Edu? --  Excl. in [place]? --     Constitutional: Alert and oriented. Well appearing and in no acute distress. Eyes: Conjunctivae are normal. Head: Atraumatic. Nose: No congestion/rhinnorhea. Mouth/Throat: Mucous membranes are moist. Neck: No stridor.  Cardiovascular: Normal rate, regular rhythm. Grossly normal heart sounds.  Good peripheral circulation. Respiratory: Normal respiratory effort.  No retractions.  Gastrointestinal: Soft and nontender. No distention. New dressing placed over his biliary tube.  Site is clean dry intact.  No purulence or erythema or drainage  After discussing procedure with general surgery, 10 cc of sterile saline injected to flush biliary tube.  Biliary bag emptied.  Instruction and teaching provided to patient as well on this process as well as supplies.  Performed using sterile technique with alcohol wipes and gloves.  Biliary drain flushes easily 10 mL without difficulty or resistance or pain.  Bag is able to be flushed as well to confirm patency.  Musculoskeletal: No lower extremity  tenderness nor edema. Neurologic:  Normal speech and language. No gross focal neurologic deficits are appreciated.  Skin:  Skin is warm, dry and intact. No rash noted. Psychiatric: Mood and affect are normal. Speech and behavior are normal.  ____________________________________________   LABS (all labs ordered are listed, but only abnormal results are displayed)  Labs Reviewed - No data to display ____________________________________________  EKG   ____________________________________________  RADIOLOGY   ____________________________________________   PROCEDURES  Procedure(s) performed: None  Procedures  Critical Care performed: No  ____________________________________________   INITIAL IMPRESSION / ASSESSMENT AND PLAN / ED COURSE  Pertinent labs & imaging results that were available during my care of the patient were reviewed by me and considered in my medical decision making (see chart for details).   Well-appearing without distress.  Afebrile.  Augmentin provided here, reports will be able to pick up his prescription Monday  Clinical Course as of 07/13/20 1511  Sun Jul 13, 2020  1445 Discussed with Dr. Celine Ahr.  [MQ]    Clinical Course User Index [MQ] Delman Kitten, MD   ----------------------------------------- 3:11 PM on 07/13/2020 -----------------------------------------   Patient feeling well.  New dressing applied, education provided.  Patient understands to follow-up with general surgery and return precautions.  Return precautions and treatment recommendations and follow-up discussed with the patient who is agreeable with the plan.   ____________________________________________   FINAL CLINICAL IMPRESSION(S) / ED DIAGNOSES  Final diagnoses:  Biliary drain displacement, sequela  Encounter for medication refill    Note:  This document was prepared using Dragon voice recognition software and may include unintentional dictation errors        Delman Kitten, MD 07/13/20 1511

## 2020-07-13 NOTE — ED Triage Notes (Signed)
Pt reports was discharged from the hospital Friday. Pt reports that the staff was in a hurry to go and noone showed him how to empty his bile drainage bag. Pt requesting education on how to empty it. Pt also reports has not been able to pick up his medication and needs a dose of his abx.

## 2020-07-14 ENCOUNTER — Telehealth: Payer: Self-pay | Admitting: Pharmacist

## 2020-07-14 ENCOUNTER — Ambulatory Visit (INDEPENDENT_AMBULATORY_CARE_PROVIDER_SITE_OTHER): Payer: Medicare Other | Admitting: Pharmacist

## 2020-07-14 ENCOUNTER — Telehealth: Payer: Self-pay

## 2020-07-14 ENCOUNTER — Encounter: Payer: Self-pay | Admitting: Radiology

## 2020-07-14 ENCOUNTER — Other Ambulatory Visit: Payer: Self-pay | Admitting: Radiology

## 2020-07-14 DIAGNOSIS — E785 Hyperlipidemia, unspecified: Secondary | ICD-10-CM | POA: Diagnosis not present

## 2020-07-14 DIAGNOSIS — I10 Essential (primary) hypertension: Secondary | ICD-10-CM

## 2020-07-14 DIAGNOSIS — E1142 Type 2 diabetes mellitus with diabetic polyneuropathy: Secondary | ICD-10-CM | POA: Diagnosis not present

## 2020-07-14 DIAGNOSIS — E1169 Type 2 diabetes mellitus with other specified complication: Secondary | ICD-10-CM

## 2020-07-14 LAB — CULTURE, BLOOD (ROUTINE X 2)
Culture: NO GROWTH
Culture: NO GROWTH

## 2020-07-14 MED ORDER — SODIUM CHLORIDE 0.9% FLUSH: 10.0000 mL | Freq: Every day | INTRAVENOUS | Status: DC

## 2020-07-14 NOTE — Telephone Encounter (Signed)
Copied from [place] [phone]. Topic: General - Other >> Jul 14, 2020  8:59 AM Celene Kras wrote: Reason for CRM: Pt called and is requesting to have PCP give him advice on [name]. He states that he does qualify for this, and that he is interested. Please advise.

## 2020-07-14 NOTE — Patient Instructions (Signed)
Visit Information  PATIENT GOALS: Goals Addressed            This Visit's Progress   . Pharmacy Goals       Our goal bad cholesterol, or LDL, is less than 70 . This is why it is important to continue taking your pravastatin  Please check your home blood pressure, keep a log of the results and bring this with you to your medical appointments.  Feel free to call me with any questions or concerns. I look forward to our next call!  Harlow Asa, PharmD, [place] [phone]       The patient verbalized understanding of instructions, educational materials, and care plan provided today and declined offer to receive copy of patient instructions, educational materials, and care plan.   Telephone follow up appointment with care management team member scheduled for: 3/2  Harlow Asa, PharmD, [place] [phone]

## 2020-07-14 NOTE — Progress Notes (Unsigned)
Normal saline flushes 10 ml called into [facility]. Flush cholecystomy tube  once daily with 5 - 10 ml.  Patient made aware.

## 2020-07-14 NOTE — Chronic Care Management (AMB) (Signed)
Chronic Care Management Pharmacy Note  07/14/2020 Name:  Richard Oconnell MRN:  [id number] DOB:  Jan 29, 1956  Subjective: Richard Oconnell is an 65 y.o. year old male who is a primary patient of [name], DO.  The CCM team was consulted for assistance with disease management and care coordination needs.    Engaged with patient by telephone for follow up visit in response to provider referral for pharmacy case management and/or care coordination services.   Consent to Services:  The patient was given information about Chronic Care Management services, agreed to services, and gave verbal consent prior to initiation of services.  Please see initial visit note for detailed documentation.   Objective:  Lab Results  Component Value Date   CREATININE 1.47 (H) 07/12/2020   CREATININE 2.16 (H) 07/11/2020   CREATININE 3.13 (H) 07/10/2020    Lab Results  Component Value Date   HGBA1C 7.2 (H) 05/30/2020       Component Value Date/Time   CHOL 181 11/24/2016 0818   TRIG 256 (H) 11/24/2016 0818   HDL 31 (L) 11/24/2016 0818   CHOLHDL 5.8 (H) 11/24/2016 0818   VLDL 51 (H) 11/24/2016 0818   LDLCALC 99 11/24/2016 0818  Per shared record with [place] LDL Calculated 92 mg/dL on 07/09/2019  BP Readings from Last 3 Encounters:  07/13/20 (!) 141/75  07/12/20 (!) 146/91  07/09/20 (!) 83/56    Assessment: Review of patient past medical history, allergies, medications, health status, including review of consultants reports, laboratory and other test data, was performed as part of comprehensive evaluation and provision of chronic care management services.   SDOH:  (Social Determinants of Health) assessments and interventions performed: none   CCM Care Plan  Allergies  Allergen Reactions  . Cyclobenzaprine Other (See Comments)    Not improving pain and makes him agressive.  . Lisinopril Cough    Medications Reviewed Today    Reviewed by  Vella Raring, [facility] (Pharmacist) on 07/14/20 at [place] List Status: <None>  Medication Order Taking? Sig Documenting Provider Last Dose Status Informant  acetaminophen (TYLENOL) 325 MG tablet [id number] No Take 1 tablet (325 mg total) by mouth every 6 (six) hours as needed for mild pain, fever or headache (or Fever >/= 101).  Patient not taking: Reported on 07/14/2020   Cherene Altes, MD Not Taking Active   amoxicillin-clavulanate (AUGMENTIN) 875-125 MG tablet [id number] Yes Take 1 tablet by mouth every 12 (twelve) hours. Cherene Altes, MD Taking Active   calcium carbonate (TUMS - DOSED IN MG ELEMENTAL CALCIUM) 500 MG chewable tablet [id number] No Chew 2 tablets by mouth daily as needed for indigestion or heartburn.  Patient not taking: Reported on 07/14/2020   [provider] Not Taking Active Self  diphenhydramine-acetaminophen (TYLENOL PM) 25-500 MG TABS tablet [id number] No Take 1-2 tablets by mouth at bedtime as needed (sleep).   Patient not taking: Reported on 07/14/2020   [provider] Not Taking Active Self  doxycycline (VIBRAMYCIN) 100 MG capsule [id number] Yes Take 100 mg by mouth 2 (two) times daily. [provider] Taking Active   metoprolol tartrate (LOPRESSOR) 25 MG tablet [id number] Yes Take 0.5 tablets (12.5 mg total) by mouth 2 (two) times daily. Cherene Altes, MD Taking Active   OZEMPIC, 0.25 OR 0.5 MG/DOSE, 2 MG/1.5ML SOPN [id number] No Inject 1 mg into the skin once a week.  Patient not taking: Reported on 07/14/2020   [name], DO  Not Taking Active Self           Med Note [facility], [name]   Fri Jun 06, 2020 10:12 AM) On Thursdays  pravastatin (PRAVACHOL) 20 MG tablet [id number] Yes Take 1 tablet (20 mg total) by mouth daily. [name], DO Taking Active Self  sodium chloride flush (NS) 0.9 % injection 10 mL [id number]   Jacqualine Mau, NP  Active   traMADol (ULTRAM) 50 MG tablet [id number] Yes  Take 1 tablet (50 mg total) by mouth every 6 (six) hours as needed for moderate pain. Cherene Altes, MD Taking Active           Patient Active Problem List   Diagnosis Date Noted  . Metabolic syndrome 62/07/5595  . Acute cholecystitis 07/09/2020  . Class 1 obesity due to excess calories with serious comorbidity and body mass index (BMI) of 33.0 to 33.9 in adult 07/06/2019  . Malignant neoplasm of urinary bladder ([place]) 12/06/2018  . Overweight (BMI 25.0-29.9) 07/04/2018  . Hyperlipidemia associated with type 2 diabetes mellitus ([place]) 11/30/2016  . 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 Gross hematuria 11/16/2016  . Erectile dysfunction associated with type 2 diabetes mellitus ([place]) 05/26/2016  . Diabetes mellitus with coincident hypertension ([place]) 06/11/2015  . Type 2 diabetes mellitus with diabetic polyneuropathy, without long-term current use of insulin ([place]) 06/11/2015    Conditions to be addressed/monitored: HTN, HLD and DMII  Care Plan : PharmD - Medication Assistance  Updates made by Vella Raring, [employer] since 07/14/2020 12:00 AM    Problem: Disease Progression     Long-Range Goal: Disease Progression Prevented or Minimized   Start Date: 06/06/2020  Expected End Date: 09/04/2020  Recent Progress: On track  Priority: High  Note:   Current Barriers:  . Difficulty with independently affording treatment regimen. Reports cost of Ozempic is difficult to afford, particularly when enters coverage gap of [name]  Pharmacist Clinical Goal(s):  Marland Kitchen Over the next 90 days, patient will verbalize ability to afford treatment regimen through collaboration with PharmD and provider.   Interventions: . 1:1 collaboration with [name], DO regarding development and update of comprehensive plan of care as evidenced by provider attestation and co-signature . Inter-disciplinary care team collaboration (see longitudinal plan of care) . Perform chart  review o Note patient admitted to [facility] 2/23 to 2/26 for acute cholecystitis, sepsis, acute kidney failure, acute urinary retention and newly diagnosed transient atrial fibrillation with RVR - Underwent percutaneous cholecystostomy tube placement on 2/24 o Patient returned to ED on 2/27 for Biliary drain displacement o Per telephone note from [place] today, patient advised to start doxycycline 100mg  PO BID x 10d in addition to already prescribed Augmentin to cover possible MRSA infection (prescription called into [place])  Date Discharged from Hospital: 2/26 Date Medication Reconciliation Performed: 2/28  Medications:  New at Discharge: . Augmentin 875-125 mg - 1 tablet every 12 hours for 10 days . Metoprolol 25 mg -  tablet twice daily . Tramadol 50 mg - 1 tablet (50 mg total) by mouth every 6 (six) hours as needed for moderate pain Adjustments at Discharge: . Acetaminophen 325 mg - 1 tablet every 6 hours as needed for mild pain, fever or headache Discontinued at Discharge:  o Valsartan 80 mg  Patient was recently discharged from hospital and all medications have been reviewed. Marland Kitchen Counsel patient on importance of completing full courses of both Augmentin and  doxycycline prescriptions . Counsel patient to take Augmentin doses with food  . Counsel patient on separating doxycycline doses from iron and calcium.  Medication Assistance: . From review of notes from [place] on 2/17, patient has been approved for [name] patient assistance program through 05/16/2021 o Reports per [employer], medication will be delivered to PCP office in ~15-20 business days. Type 2 Diabetes: . Current treatment: o Not currently taking Ozempic 1mg  weekly (last dose on 2/17) - Reports planning to restart on his usual administration day, Thursday . Reports recent home fasting readings: o Today:  106 o Yesterday: 115 . Will collaborate with PCP regarding patient's diabetes medication managment  Hypertension/ Newly Diagnosed Atrial Fibrillation . Per review of 2/26 discharge notes, atrial fibrillation "felt to be a consequence of sepsis" . Current treatment: o Metoprolol 25 mg -  tablet twice daily o Patient confirms discontinued valsartan as directed . Reports home blood pressure readings: o 2/28: 142/80, HR 85 o 2/27: 139/85, HR 81  Hyperlipidemia:  . Current treatment: pravastatin 20 mg daily . Will collaborate with PCP to recommend follow up lipid panel at upcoming appointment  Patient Goals/Self-Care Activities . Over the next 90 days, patient will:  - check glucose, document, and provide at future appointments - check blood pressure, document, and provide at future appointments - collaborate with provider on medication access solutions  Follow Up Plan: Telephone follow up appointment with care management team member scheduled for: 3/2     Follow Up:  Patient agrees to Care Plan and Follow-up.  Plan: Telephone follow up appointment with care management team member scheduled for:  3/2  Harlow Asa, PharmD, [place] [phone]

## 2020-07-14 NOTE — Telephone Encounter (Signed)
Actually, he may recall that Harlow Asa [facility] through our [place] program is managing this with him.  They last discussed it in phone call 06/13/20.  Copied and pasted from her note below for reference.  Please notify patient that it looks like he is ALREADY scheduled to talk with Grayland Ormond this afternoon, 07/14/20 at 3:15pm. He should be available to take her call to him around that time to answer questions regarding the [place] assistance.   Medication Assistance:  Based on reported income, had advised patient to apply for Extra Help subsidy  ? Today patient reports that his cousin helped him with the Extra Help application, but patient unable to confirm whether extra help application was submitted. ? Reports that he called Social Security and was informed that he does not qualify for extra help based on assets ? Additionally, patient states that he received a call telling him that he was approved for patient assistance for Ozempic from manufacturer, but patient denies having submitted application to [name]  Patient states that he will call [name] patient assistance program today to confirm that he is enrolled. Provide patient with phone number to [name] [phone])  Receive call back from patient letting me know that [place] had no record of his application and advised him to submit application to program. Patient states representative told him that [name] will not require a denial letter from extra help for him  Will collaborate with [place] for her help to patient with applying for patient assistance for [employer] from [place] 07/14/2020, 11:02 AM

## 2020-07-14 NOTE — Telephone Encounter (Signed)
Transition Care Management Unsuccessful Follow-up Telephone Call  Date of discharge and from where:  07/12/2020 [facility]  Attempts:  1st Attempt  Reason for unsuccessful TCM follow-up call:  No answer/busy, patient could not talk at that momemnt

## 2020-07-14 NOTE — Telephone Encounter (Signed)
Copied from [place] [phone]. Topic: Compliment - Provider (non-sensitive) >> Jul 14, 2020  8:55 AM Celene Kras wrote: Date of encounter: 07/09/20 Details of compliment: Pt called stating that he is very thankful that PCP instructed him to go to ED. He states that PCP saved his life. He wanted to Thank provider for his speedy diagnosis and his care.  Who would the patient like to thank/see rewarded? Dr. Parks Ranger  On a scale of 1-10, how was your experience? 10  Route to Engineer, building services.

## 2020-07-14 NOTE — Progress Notes (Signed)
Pharmacy - Antimicrobial Stewardship  76 YOM with acute cholecystitis s/p placement of cholecystostomy tube by IR.  Bile culture growing MRSA.  Patient discharge on amoxicillin/clavulonate  Discussed with Dr Thereasa Solo,  Possible the MRSA is a skin contaminant but preference is to treat  Plan: 1. Add doxycyline 100mg  PO BIC x 10d to already prescribed amox/clav  - prescription called to [name] 2. Patient contacted - educated to take both antibiotics.  Educated on side effects and drug interactions.  Doreene Eland, PharmD, BCPS.   Work Cell: [phone] 07/14/2020 9:13 AM

## 2020-07-14 NOTE — Telephone Encounter (Signed)
Transition Care Management Follow-up Telephone Call  Date of discharge and from where: 07/13/2019 [facility]  How have you been since you were released from the hospital? Doing good  Any questions or concerns? No  Items Reviewed:  Did the pt receive and understand the discharge instructions provided? Yes   Medications obtained and verified? Yes   Other? No   Any new allergies since your discharge? No   Dietary orders reviewed? Yes  Do you have support at home? Yes   Home Care and Equipment/Supplies: Were home health services ordered? not applicable If so, what is the name of the agency? n/a  Has the agency set up a time to come to the patient's home? not applicable Were any new equipment or medical supplies ordered?  No What is the name of the medical supply agency? n/a Were you able to get the supplies/equipment? not applicable Do you have any questions related to the use of the equipment or supplies? No  Functional Questionnaire: (I = Independent and D = Dependent) ADLs: I  Bathing/Dressing- I  Meal Prep- I  Eating- I  Maintaining continence- I  Transferring/Ambulation- I  Managing Meds- I  Follow up appointments reviewed:   PCP Hospital f/u appt confirmed? Yes  Scheduled to see Dr. Parks Ranger on 07/21/2020 @ 10:40.  Are transportation arrangements needed? No   If their condition worsens, is the pt aware to call PCP or go to the Emergency Dept.? Yes  Was the patient provided with contact information for the PCP's office or ED? Yes  Was to pt encouraged to call back with questions or concerns? Yes

## 2020-07-14 NOTE — Telephone Encounter (Signed)
No problem. I reminded him of his appt this afternoon with you.     Richard Oconnell

## 2020-07-14 NOTE — Telephone Encounter (Signed)
The pt was notified of Dr. K recommendation. He verbalize understanding, no questions or concerns.  

## 2020-07-15 LAB — AEROBIC/ANAEROBIC CULTURE W GRAM STAIN (SURGICAL/DEEP WOUND): Gram Stain: NONE SEEN

## 2020-07-16 ENCOUNTER — Ambulatory Visit: Payer: Medicare Other | Admitting: Pharmacist

## 2020-07-16 DIAGNOSIS — E1142 Type 2 diabetes mellitus with diabetic polyneuropathy: Secondary | ICD-10-CM

## 2020-07-16 NOTE — Patient Instructions (Signed)
Visit Information  PATIENT GOALS: Goals Addressed            This Visit's Progress   . Pharmacy Goals       Our goal bad cholesterol, or LDL, is less than 70 . This is why it is important to continue taking your pravastatin  Please check your home blood pressure, keep a log of the results and bring this with you to your medical appointments.  Feel free to call me with any questions or concerns. I look forward to our next call!  Harlow Asa, PharmD, [place] [phone]       The patient verbalized understanding of instructions, educational materials, and care plan provided today and declined offer to receive copy of patient instructions, educational materials, and care plan.   Telephone follow up appointment with care management team member scheduled for:  08/11/2020 at 1:15 PM  Harlow Asa, PharmD, [place] [phone]

## 2020-07-16 NOTE — Chronic Care Management (AMB) (Signed)
Chronic Care Management Pharmacy Note  07/16/2020 Name:  Richard Oconnell MRN:  [id number] DOB:  03-07-1956  Subjective: Richard Oconnell is an 65 y.o. year old male who is a primary patient of Richard Hauser, DO.  The CCM team was consulted for assistance with disease management and care coordination needs.    Engaged with patient by telephone for follow up visit in response to provider referral for pharmacy case management and/or care coordination services.   Consent to Services:  The patient was given information about Chronic Care Management services, agreed to services, and gave verbal consent prior to initiation of services.  Please see initial visit note for detailed documentation.   Objective:  Lab Results  Component Value Date   CREATININE 1.47 (H) 07/12/2020   CREATININE 2.16 (H) 07/11/2020   CREATININE 3.13 (H) 07/10/2020    Lab Results  Component Value Date   HGBA1C 7.2 (H) 05/30/2020       Component Value Date/Time   CHOL 181 11/24/2016 0818   TRIG 256 (H) 11/24/2016 0818   HDL 31 (L) 11/24/2016 0818   CHOLHDL 5.8 (H) 11/24/2016 0818   VLDL 51 (H) 11/24/2016 0818   LDLCALC 99 11/24/2016 0818    BP Readings from Last 3 Encounters:  07/13/20 (!) 141/75  07/12/20 (!) 146/91  07/09/20 (!) 83/56    Assessment: Review of patient past medical history, allergies, medications, health status, including review of consultants reports, laboratory and other test data, was performed as part of comprehensive evaluation and provision of chronic care management services.   SDOH:  (Social Determinants of Health) assessments and interventions performed: none   CCM Care Plan  Allergies  Allergen Reactions  . Cyclobenzaprine Other (See Comments)    Not improving pain and makes him agressive.  . Lisinopril Cough    Medications Reviewed Today    Reviewed by Vella Raring, [facility] (Pharmacist) on 07/14/20 at [place] List Status: <None>  Medication  Order Taking? Sig Documenting Provider Last Dose Status Informant  acetaminophen (TYLENOL) 325 MG tablet [id number] No Take 1 tablet (325 mg total) by mouth every 6 (six) hours as needed for mild pain, fever or headache (or Fever >/= 101).  Patient not taking: Reported on 07/14/2020   Cherene Altes, MD Not Taking Active   amoxicillin-clavulanate (AUGMENTIN) 875-125 MG tablet [id number] Yes Take 1 tablet by mouth every 12 (twelve) hours. Cherene Altes, MD Taking Active   calcium carbonate (TUMS - DOSED IN MG ELEMENTAL CALCIUM) 500 MG chewable tablet [id number] No Chew 2 tablets by mouth daily as needed for indigestion or heartburn.  Patient not taking: Reported on 07/14/2020   [provider] Not Taking Active Self  diphenhydramine-acetaminophen (TYLENOL PM) 25-500 MG TABS tablet [id number] No Take 1-2 tablets by mouth at bedtime as needed (sleep).   Patient not taking: Reported on 07/14/2020   [provider] Not Taking Active Self  doxycycline (VIBRAMYCIN) 100 MG capsule [id number] Yes Take 100 mg by mouth 2 (two) times daily. [provider] Taking Active   metoprolol tartrate (LOPRESSOR) 25 MG tablet [id number] Yes Take 0.5 tablets (12.5 mg total) by mouth 2 (two) times daily. Cherene Altes, MD Taking Active   OZEMPIC, 0.25 OR 0.5 MG/DOSE, 2 MG/1.5ML SOPN [id number] No Inject 1 mg into the skin once a week.  Patient not taking: Reported on 07/14/2020   Richard Hauser, DO Not Taking Active Self  Med Note [facility], Rikki Trosper A   Fri Jun 06, 2020 10:12 AM) On Thursdays  pravastatin (PRAVACHOL) 20 MG tablet [id number] Yes Take 1 tablet (20 mg total) by mouth daily. Richard Hauser, DO Taking Active Self  sodium chloride flush (NS) 0.9 % injection 10 mL [id number]   Jacqualine Mau, NP  Active   traMADol (ULTRAM) 50 MG tablet [id number] Yes Take 1 tablet (50 mg total) by mouth every 6 (six) hours as needed for moderate pain. Cherene Altes, MD Taking Active           Patient Active Problem List   Diagnosis Date Noted  . Metabolic syndrome 78/93/8101  . Acute cholecystitis 07/09/2020  . Class 1 obesity due to excess calories with serious comorbidity and body mass index (BMI) of 33.0 to 33.9 in adult 07/06/2019  . Malignant neoplasm of urinary bladder ([place]) 12/06/2018  . Overweight (BMI 25.0-29.9) 07/04/2018  . Hyperlipidemia associated with type 2 diabetes mellitus ([place]) 11/30/2016  . 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 Gross hematuria 11/16/2016  . Erectile dysfunction associated with type 2 diabetes mellitus ([place]) 05/26/2016  . Diabetes mellitus with coincident hypertension ([place]) 06/11/2015  . Type 2 diabetes mellitus with diabetic polyneuropathy, without long-term current use of insulin ([place]) 06/11/2015    Conditions to be addressed/monitored: HLD and DMII  Care Plan : PharmD - Medication Assistance  Updates made by Vella Raring, [place] since 07/16/2020 12:00 AM    Problem: Disease Progression     Long-Range Goal: Disease Progression Prevented or Minimized   Start Date: 06/06/2020  Expected End Date: 09/04/2020  Recent Progress: On track  Priority: High  Note:   Current Barriers:  . Difficulty with independently affording treatment regimen. Reports cost of Ozempic is difficult to afford, particularly when enters coverage gap of [name] o Patient APPROVED for patient assistance from [place] through 05/16/2021  Pharmacist Clinical Goal(s):  Marland Kitchen Over the next 90 days, patient will verbalize ability to afford treatment regimen through collaboration with PharmD and provider.   Interventions: . 1:1 collaboration with Richard Hauser, DO regarding development and update of comprehensive plan of care as evidenced by provider attestation and co-signature . Inter-disciplinary care team collaboration (see longitudinal plan of care) . Collaborated with PCP regarding patient's  diabetes medication management o Note patient recently admitted to [facility] 2/23 to 2/26 for acute cholecystitis and a cholecystostomy tube was placed o Note patient reports home fasting blood sugars have remained controlled, <130 mg/dL, since returned home o Provider agrees with recommendation to hold patient's Ozempic at this time as there is some study data to indicate that GLP-1 RAs may exacerbate gall bladder disease - Today, patient verbalizes understanding of plan to hold Ozempic for now - Reports fasting CBG this morning 127 . Patient reports that he now has an appointment scheduled for tomorrow with [facility] for follow up regarding his biliary drain . Reports has been flushing biliary drain as directed with saline as directed  Patient Goals/Self-Care Activities . Over the next 90 days, patient will:  - check glucose, document, and provide at future appointments - check blood pressure, document, and provide at future appointments - collaborate with provider on medication access solutions  Follow Up Plan: Telephone follow up appointment with care management team member scheduled for: 08/11/2020 at 1:15 PM     Follow Up:  Patient agrees to Care Plan and Follow-up.  Harlow Asa, PharmD, [place] Clinical Pharmacist  [employer] [phone]

## 2020-07-17 ENCOUNTER — Other Ambulatory Visit: Payer: Self-pay

## 2020-07-17 ENCOUNTER — Other Ambulatory Visit: Payer: Self-pay | Admitting: General Surgery

## 2020-07-17 ENCOUNTER — Encounter: Payer: Self-pay | Admitting: General Surgery

## 2020-07-17 ENCOUNTER — Ambulatory Visit: Payer: Medicare Other | Admitting: General Surgery

## 2020-07-17 VITALS — BP 150/95 | HR 64 | Temp 98.3°F | Ht 76.0 in | Wt 259.2 lb

## 2020-07-17 DIAGNOSIS — K810 Acute cholecystitis: Secondary | ICD-10-CM

## 2020-07-17 NOTE — Patient Instructions (Addendum)
Cholangiogram scheduled 08/07/20 @ 10:30 Please see your follow up appointment listed below.

## 2020-07-17 NOTE — Progress Notes (Signed)
Richard Oconnell is here today for follow-up from a recent hospitalization.  He presented to the emergency department with sepsis and was found to have severe acute cholecystitis.  Due to the degree of inflammation and the duration of his symptoms, it was felt most prudent to place a percutaneous cholecystostomy tube for biliary decompression, with plans for future cholecystectomy.  He ultimately improved and was discharged to home.  He presented to the emergency department the next day, concerned that he had not received any information about how to care for his drain.  I communicated with the emergency department provider that evening and was able to communicate the proper instructions for flushing the drain as well as emptying the bag.  Since his emergency room visit, Richard Oconnell states that he has not had any fevers or chills.  No nausea or vomiting.  He is tolerating a diet and having normal bowel function.  He states that once he received instruction, he has had no difficulty caring for his drain.  Past Medical History:  Diagnosis Date  . Cancer [facility])    Bladder Cancer  . Erectile dysfunction associated with type 2 diabetes mellitus ([place]) 05/26/2016  . Gross hematuria 11/16/2016  . Hypertension 06/11/2015  . Type 2 diabetes mellitus with diabetic polyneuropathy, without long-term current use of insulin ([place]) 06/11/2015   Past Surgical History:  Procedure Laterality Date  . boil lanceted     hand  . COLONOSCOPY WITH PROPOFOL N/A 07/03/2020   Procedure: COLONOSCOPY WITH PROPOFOL;  Surgeon: Jonathon Bellows, MD;  Location: [facility] ENDOSCOPY;  Service: Gastroenterology;  Laterality: N/A;  . CYSTOSCOPY W/ RETROGRADES Bilateral 12/06/2016   Procedure: CYSTOSCOPY WITH RETROGRADE PYELOGRAM;  Surgeon: Hollice Espy, MD;  Location: [facility] ORS;  Service: Urology;  Laterality: Bilateral;  . CYSTOSCOPY W/ RETROGRADES Bilateral 06/27/2017   Procedure: CYSTOSCOPY WITH RETROGRADE PYELOGRAM;  Surgeon: Hollice Espy, MD;   Location: [facility] ORS;  Service: Urology;  Laterality: Bilateral;  . CYSTOSCOPY WITH BIOPSY N/A 06/27/2017   Procedure: CYSTOSCOPY WITH Bladder BIOPSY;  Surgeon: Hollice Espy, MD;  Location: [facility] ORS;  Service: Urology;  Laterality: N/A;  . CYSTOSCOPY WITH BIOPSY N/A 10/24/2017   Procedure: CYSTOSCOPY WITH Bladder BIOPSY;  Surgeon: Hollice Espy, MD;  Location: [facility] ORS;  Service: Urology;  Laterality: N/A;  . ingrown  Bilateral    ingrown toenail  . IR BILIARY DRAIN PLACEMENT WITH CHOLANGIOGRAM  07/10/2020  . TRANSURETHRAL RESECTION OF BLADDER TUMOR N/A 12/06/2016   Procedure: TRANSURETHRAL RESECTION OF BLADDER TUMOR (TURBT) (2-5cm) CLOT EVACUATION;  Surgeon: Hollice Espy, MD;  Location: [facility] ORS;  Service: Urology;  Laterality: N/A;  . TRANSURETHRAL RESECTION OF BLADDER TUMOR N/A 06/27/2017   Procedure: TRANSURETHRAL RESECTION OF BLADDER TUMOR (TURBT);  Surgeon: Hollice Espy, MD;  Location: [facility] ORS;  Service: Urology;  Laterality: N/A;  . TRANSURETHRAL RESECTION OF BLADDER TUMOR WITH MITOMYCIN-C N/A 01/18/2017   Procedure: TRANSURETHRAL RESECTION OF BLADDER TUMOR WITH MITOMYCIN-C-(SMALL);  Surgeon: Hollice Espy, MD;  Location: [facility] ORS;  Service: Urology;  Laterality: N/A;   Family History  Problem Relation Age of Onset  . Heart attack Mother   . Heart disease Mother   . Parkinson's disease Father   . Heart attack Maternal Grandfather   . Heart disease Maternal Grandfather   . Heart attack Maternal Aunt   . Heart attack Maternal Grandmother   . Prostate cancer Neg Hx   . Bladder Cancer Neg Hx   . Kidney cancer Neg Hx    Social History   Tobacco Use  .  Smoking status: Never Smoker  . Smokeless tobacco: Never Used  . Tobacco comment: once a while had one cigar >10 years ago  Vaping Use  . Vaping Use: Never used  Substance Use Topics  . Alcohol use: No    Alcohol/week: 0.0 standard drinks  . Drug use: No   Current Meds  Medication Sig  . acetaminophen (TYLENOL) 325 MG  tablet Take 1 tablet (325 mg total) by mouth every 6 (six) hours as needed for mild pain, fever or headache (or Fever >/= 101).  Marland Kitchen amoxicillin-clavulanate (AUGMENTIN) 875-125 MG tablet Take 1 tablet by mouth every 12 (twelve) hours.  . calcium carbonate (TUMS - DOSED IN MG ELEMENTAL CALCIUM) 500 MG chewable tablet Chew 2 tablets by mouth daily as needed for indigestion or heartburn.  . diphenhydramine-acetaminophen (TYLENOL PM) 25-500 MG TABS tablet Take 1-2 tablets by mouth at bedtime as needed (sleep).  Marland Kitchen doxycycline (VIBRAMYCIN) 100 MG capsule Take 100 mg by mouth 2 (two) times daily.  . metoprolol tartrate (LOPRESSOR) 25 MG tablet Take 0.5 tablets (12.5 mg total) by mouth 2 (two) times daily.  Marland Kitchen OZEMPIC, 0.25 OR 0.5 MG/DOSE, 2 MG/1.5ML SOPN Inject 1 mg into the skin once a week.  . pravastatin (PRAVACHOL) 20 MG tablet Take 1 tablet (20 mg total) by mouth daily.  . traMADol (ULTRAM) 50 MG tablet Take 1 tablet (50 mg total) by mouth every 6 (six) hours as needed for moderate pain.   Current Facility-Administered Medications for the 07/17/20 encounter (Office Visit) with [name], MD  Medication  . sodium chloride flush (NS) 0.9 % injection 10 mL   Allergies  Allergen Reactions  . Cyclobenzaprine Other (See Comments)    Not improving pain and makes him agressive.  . Lisinopril Cough     Today's Vitals   07/17/20 1350  BP: (!) 150/95  Pulse: 64  Temp: 98.3 F (36.8 C)  TempSrc: Oral  SpO2: 96%  Weight: 259 lb 3.2 oz (117.6 kg)  Height: 6\' 4"  (1.93 m)   Body mass index is 31.55 kg/m. Physical Exam Constitutional:      General: He is not in acute distress.    Appearance: He is obese.  HENT:     Head: Normocephalic and atraumatic.     Nose:     Comments: Covered with a mask    Mouth/Throat:     Comments: Covered with a mask Eyes:     General: No scleral icterus.       Right eye: No discharge.        Left eye: No discharge.  Cardiovascular:     Rate and Rhythm:  Normal rate and regular rhythm.  Pulmonary:     Effort: Pulmonary effort is normal. No respiratory distress.  Abdominal:     Comments: There is a right upper quadrant biliary drain with thin, clear, pale green bile present.  Abdominal tenderness has resolved  Genitourinary:    Comments: Deferred Skin:    General: Skin is warm and dry.     Coloration: Skin is not jaundiced.  Neurological:     General: No focal deficit present.     Mental Status: He is alert and oriented to person, place, and time.  Psychiatric:        Mood and Affect: Mood normal.        Behavior: Behavior normal.    Labs: Results for Richard Oconnell, Richard Oconnell (MRN [id number]) as of 07/17/2020 14:58  Ref. Range 07/09/2020 16:00 07/10/2020 02:58 07/11/2020 06:03  07/12/2020 04:38  WBC Latest Ref Range: 4.0 - 10.5 K/uL 27.0 (H) 16.4 (H) 10.7 (H) 7.8  RBC Latest Ref Range: 4.22 - 5.81 MIL/uL 6.02 (H) 4.86 4.38 4.38  Hemoglobin Latest Ref Range: 13.0 - 17.0 g/dL 18.0 (H) 14.6 13.1 12.9 (L)  HCT Latest Ref Range: 39.0 - 52.0 % 52.0 43.0 38.6 (L) 38.3 (L)  MCV Latest Ref Range: 80.0 - 100.0 fL 86.4 88.5 88.1 87.4  MCH Latest Ref Range: 26.0 - 34.0 pg 29.9 30.0 29.9 29.5  MCHC Latest Ref Range: 30.0 - 36.0 g/dL 34.6 34.0 33.9 33.7  RDW Latest Ref Range: 11.5 - 15.5 % 12.8 12.9 13.1 12.8  Platelets Latest Ref Range: 150 - 400 K/uL 310 238 254 268  nRBC Latest Ref Range: 0.0 - 0.2 % 0.0 0.0 0.0 0.0  The show resolution of his leukocytosis and dehydration during his hospital course. Results for Richard Oconnell, Richard Oconnell (MRN [id number]) as of 07/17/2020 14:58  Ref. Range 07/09/2020 16:00 07/09/2020 19:13 07/10/2020 02:58 07/11/2020 06:03 07/12/2020 04:38  Sodium Latest Ref Range: 135 - 145 mmol/L 135  136 135 140  Potassium Latest Ref Range: 3.5 - 5.1 mmol/L 3.9  3.6 3.3 (L) 3.5  Chloride Latest Ref Range: 98 - 111 mmol/L 102  104 106 107  CO2 Latest Ref Range: 22 - 32 mmol/L 18 (L)  21 (L) 22 25  Glucose Latest Ref Range: 70 - 99 mg/dL 207 (H)   131 (H) 126 (H) 113 (H)  BUN Latest Ref Range: 8 - 23 mg/dL 39 (H)  46 (H) 44 (H) 37 (H)  Creatinine Latest Ref Range: 0.61 - 1.24 mg/dL 3.12 (H)  3.13 (H) 2.16 (H) 1.47 (H)  Calcium Latest Ref Range: 8.9 - 10.3 mg/dL 9.5  8.4 (L) 7.9 (L) 8.2 (L)  Anion gap Latest Ref Range: 5 - 15  15  11 7 8   Magnesium Latest Ref Range: 1.7 - 2.4 mg/dL  2.0  2.0   Alkaline Phosphatase Latest Ref Range: 38 - 126 U/L 88  103 115 106  Albumin Latest Ref Range: 3.5 - 5.0 g/dL 3.9  3.1 (L) 2.7 (L) 2.7 (L)  Lipase Latest Ref Range: 11 - 51 U/L 24      AST Latest Ref Range: 15 - 41 U/L 48 (H)  76 (H) 67 (H) 67 (H)  ALT Latest Ref Range: 0 - 44 U/L 52 (H)  53 (H) 47 (H) 47 (H)  Total Protein Latest Ref Range: 6.5 - 8.1 g/dL 8.3 (H)  6.6 5.8 (L) 6.2 (L)  Total Bilirubin Latest Ref Range: 0.3 - 1.2 mg/dL 2.0 (H)  2.2 (H) 1.3 (H) 1.2  GFR, Estimated Latest Ref Range: >60 mL/min 21 (L)  21 (L) 33 (L) 53 (L)  These show an improvement in his acute kidney injury as well as liver functions during his hospitalization  Bile cultures showed minimal growth of methicillin-resistant staph aureus.   Impression and plan: This is a 65 year old man who presented with sepsis secondary to acute cholecystitis.  A percutaneous drain was placed.  Ultimately, he will require interval cholecystectomy.  In the interim, he should continue to care for his drain as instructed.  We will arrange a follow-up visit with the [facility] radiology group for drain injection.  This may result in the drain being capped and subsequently removed, versus maintained until the time of surgery.  I anticipate performing a robot-assisted laparoscopic cholecystectomy in 6 to 8 weeks.  We will have him return to clinic  prior to that time for a full update of his history and physical.

## 2020-07-18 ENCOUNTER — Telehealth: Payer: Self-pay

## 2020-07-18 ENCOUNTER — Ambulatory Visit: Admit: 2020-07-18 | Payer: Medicare Other | Admitting: Vascular Surgery

## 2020-07-18 SURGERY — DIALYSIS/PERMA CATHETER INSERTION
Anesthesia: Moderate Sedation

## 2020-07-18 NOTE — Telephone Encounter (Signed)
The pt was notified that his Ozempic came in and is available for pick up.

## 2020-07-21 ENCOUNTER — Other Ambulatory Visit: Payer: Self-pay

## 2020-07-21 ENCOUNTER — Ambulatory Visit (INDEPENDENT_AMBULATORY_CARE_PROVIDER_SITE_OTHER): Payer: Medicare Other | Admitting: Family Medicine

## 2020-07-21 ENCOUNTER — Encounter: Payer: Self-pay | Admitting: Family Medicine

## 2020-07-21 VITALS — BP 135/91 | HR 68 | Temp 97.3°F | Ht 76.0 in | Wt 255.8 lb

## 2020-07-21 DIAGNOSIS — E1169 Type 2 diabetes mellitus with other specified complication: Secondary | ICD-10-CM

## 2020-07-21 DIAGNOSIS — G4700 Insomnia, unspecified: Secondary | ICD-10-CM

## 2020-07-21 DIAGNOSIS — K810 Acute cholecystitis: Secondary | ICD-10-CM | POA: Diagnosis not present

## 2020-07-21 DIAGNOSIS — E785 Hyperlipidemia, unspecified: Secondary | ICD-10-CM

## 2020-07-21 MED ORDER — ZOLPIDEM TARTRATE 5 MG PO TABS: 5.0000 mg | ORAL_TABLET | Freq: Every evening | ORAL | 1 refills | Status: DC | PRN

## 2020-07-21 NOTE — Patient Instructions (Addendum)
Thank you for coming to the office today.  Keep on antibiotic Augmentin.  Keep on plan with upcoming biliary drain imaging and removal.  Refilled Ambien as a temporary solution to help sleep with the drain.  Labs today.   Please schedule a Follow-up Appointment to: Return in about 3 months (around 10/21/2020) for 3 month follow-up DM A1c, HLD.  If you have any other questions or concerns, please feel free to call the office or send a message through [place]. You may also schedule an earlier appointment if necessary.  Additionally, you may be receiving a survey about your experience at our office within a few days to 1 week by e-mail or mail. We value your feedback.  Nobie Putnam, DO [place]

## 2020-07-21 NOTE — Progress Notes (Signed)
Subjective:    [name], male    DOB: Jun 24, 1955, 65 y.o.   MRN: [id number]  Richard Oconnell is a 65 y.o. male presenting on 07/21/2020 for Hospitalization Follow-up and Diabetes   HPI  HOSPITAL FOLLOW-UP VISIT  Hospital/Location: [facility] Date of Admission: 07/09/20 Date of Discharge: 07/12/20 Transitions of care telephone call: 07/14/20 by Kellie Simmering LPN  Reason for Admission: Acute Cholecystitis Primary (+Secondary) Diagnosis: Acute Cholecystitis  - Hospital H&P and Discharge Summary have been reviewed - Patient presents today 7 days after recent hospitalization. Brief summary of recent course, patient had symptoms of acute abdominal pain, evaluated here 07/09/20 and sent directly to [facility] ED  - Had episode of transient AFib. Has resolved.  - Today reports overall has done well after discharge. Symptoms of RUQ abdominal pain. He has had issues with the biliary drain in place. Limited output but still functioning well, he did return to [facility] ED on 2/27 for drainage of the drain and bag to learn more. He changes dressing on the site of drain.  Reduced Appetite. Taking in some semi solid food at times. Some liquid intake.  Taking Augmentin antibiotic BID x 10 days. He is limiting dairy on this med.  I have reviewed the discharge medication list, and have reconciled the current and discharge medications today.   Current Outpatient Medications:    acetaminophen (TYLENOL) 325 MG tablet, Take 1 tablet (325 mg total) by mouth every 6 (six) hours as needed for mild pain, fever or headache (or Fever >/= 101)., Disp: , Rfl:    amoxicillin-clavulanate (AUGMENTIN) 875-125 MG tablet, Take 1 tablet by mouth every 12 (twelve) hours., Disp: 20 tablet, Rfl: 0   calcium carbonate (TUMS - DOSED IN MG ELEMENTAL CALCIUM) 500 MG chewable tablet, Chew 2 tablets by mouth daily as needed for indigestion or heartburn., Disp: , Rfl:    diphenhydramine-acetaminophen (TYLENOL PM)  25-500 MG TABS tablet, Take 1-2 tablets by mouth at bedtime as needed (sleep)., Disp: , Rfl:    doxycycline (VIBRAMYCIN) 100 MG capsule, Take 100 mg by mouth 2 (two) times daily., Disp: , Rfl:    metoprolol tartrate (LOPRESSOR) 25 MG tablet, Take 0.5 tablets (12.5 mg total) by mouth 2 (two) times daily., Disp: 60 tablet, Rfl: 0   OZEMPIC, 0.25 OR 0.5 MG/DOSE, 2 MG/1.5ML SOPN, Inject 1 mg into the skin once a week., Disp: 1.5 mL, Rfl: 0   pravastatin (PRAVACHOL) 20 MG tablet, Take 1 tablet (20 mg total) by mouth daily., Disp: 90 tablet, Rfl: 1   traMADol (ULTRAM) 50 MG tablet, Take 1 tablet (50 mg total) by mouth every 6 (six) hours as needed for moderate pain., Disp: 15 tablet, Rfl: 0   zolpidem (AMBIEN) 5 MG tablet, Take 1 tablet (5 mg total) by mouth at bedtime as needed for sleep., Disp: 20 tablet, Rfl: 1  Current Facility-Administered Medications:    sodium chloride flush (NS) 0.9 % injection 10 mL, 10 mL, Intravenous, Daily, Omohundro, Jennifer C, NP  ------------------------------------------------------------------------- Social History   Tobacco Use   Smoking status: Never Smoker   Smokeless tobacco: Never Used   Tobacco comment: once a while had one cigar >10 years ago  Vaping Use   Vaping Use: Never used  Substance Use Topics   Alcohol use: No    Alcohol/week: 0.0 standard drinks   Drug use: No    Review of Systems Per HPI unless specifically indicated above     Objective:  BP (!) 135/91 (BP Location: Left Arm, Patient Position: Sitting, Cuff Size: Normal)    Pulse 68    Temp (!) 97.3 F (36.3 C) (Temporal)    Ht 6\' 4"  (1.93 m)    Wt 255 lb 12.8 oz (116 kg)    SpO2 98%    BMI 31.14 kg/m   Wt Readings from Last 3 Encounters:  07/21/20 255 lb 12.8 oz (116 kg)  07/17/20 259 lb 3.2 oz (117.6 kg)  07/13/20 260 lb 2.3 oz (118 kg)    Physical Exam Vitals and nursing note reviewed.  Constitutional:      General: He is not in acute distress.    Appearance:  He is well-developed and well-nourished. He is not diaphoretic.     Comments: Well-appearing, comfortable, cooperative  HENT:     Head: Normocephalic and atraumatic.     Mouth/Throat:     Mouth: Oropharynx is clear and moist.  Eyes:     General:        Right eye: No discharge.        Left eye: No discharge.     Conjunctiva/sclera: Conjunctivae normal.  Neck:     Thyroid: No thyromegaly.  Cardiovascular:     Rate and Rhythm: Normal rate and regular rhythm.     Pulses: Intact distal pulses.     Heart sounds: Normal heart sounds. No murmur heard.   Pulmonary:     Effort: Pulmonary effort is normal. No respiratory distress.     Breath sounds: Normal breath sounds. No wheezing or rales.  Abdominal:     Comments: RUQ biliary drain in place, clean dry intact, has fluid in tube and small amount in bag  Musculoskeletal:        General: No edema. Normal range of motion.     Cervical back: Normal range of motion and neck supple.  Lymphadenopathy:     Cervical: No cervical adenopathy.  Skin:    General: Skin is warm and dry.     Findings: No erythema or rash.  Neurological:     Mental Status: He is alert and oriented to person, place, and time.  Psychiatric:        Mood and Affect: Mood and affect normal.        Behavior: Behavior normal.     Comments: Well groomed, good eye contact, normal speech and thoughts        Results for orders placed or performed during the hospital encounter of 07/09/20  Culture, blood (Routine X 2) w Reflex to ID Panel   Specimen: BLOOD  Result Value Ref Range   Specimen Description BLOOD BLOOD LEFT ARM    Special Requests      BOTTLES DRAWN AEROBIC AND ANAEROBIC Blood Culture results may not be optimal due to an excessive volume of blood received in culture bottles   Culture      NO GROWTH 5 DAYS Performed at [facility], [address] [place], [place] [address]    Report Status 07/14/2020 FINAL   Culture, blood (Routine X 2) w  Reflex to ID Panel   Specimen: BLOOD  Result Value Ref Range   Specimen Description BLOOD BLOOD RIGHT ARM    Special Requests      BOTTLES DRAWN AEROBIC AND ANAEROBIC Blood Culture results may not be optimal due to an excessive volume of blood received in culture bottles   Culture      NO GROWTH 5 DAYS Performed at [facility], [address] [place], [place] [address]    Report Status 07/14/2020 FINAL   SARS CORONAVIRUS 2 (TAT 6-24 HRS) Nasopharyngeal Nasopharyngeal Swab   Specimen: Nasopharyngeal Swab  Result Value Ref Range   SARS Coronavirus 2 NEGATIVE NEGATIVE  Aerobic/Anaerobic Culture (surgical/deep wound)   Specimen: Fluid; Bile  Result Value Ref Range   Specimen Description      FLUID Performed at [facility], [address] [place], [place] [address]    Special Requests      GALL BLADDER FLUID Performed at [facility], [address] [place], [place] [address]    Gram Stain NO WBC SEEN NO ORGANISMS SEEN     Culture      RARE METHICILLIN RESISTANT STAPHYLOCOCCUS AUREUS NO ANAEROBES ISOLATED Performed at [facility], [place] [address], [place], [place] [address]    Report Status 07/15/2020 FINAL    Organism ID, Bacteria METHICILLIN RESISTANT STAPHYLOCOCCUS AUREUS       Susceptibility   Methicillin resistant staphylococcus aureus - MIC*    CIPROFLOXACIN >=8 RESISTANT Resistant     ERYTHROMYCIN >=8 RESISTANT Resistant     GENTAMICIN <=0.5 SENSITIVE Sensitive     OXACILLIN >=4 RESISTANT Resistant     TETRACYCLINE <=1 SENSITIVE Sensitive     VANCOMYCIN <=0.5 SENSITIVE Sensitive     TRIMETH/SULFA <=10 SENSITIVE Sensitive     CLINDAMYCIN <=0.25 SENSITIVE Sensitive     RIFAMPIN <=0.5 SENSITIVE Sensitive     Inducible Clindamycin NEGATIVE Sensitive     * RARE METHICILLIN RESISTANT STAPHYLOCOCCUS AUREUS  Lipase, blood  Result Value Ref Range   Lipase 24 11 - 51 U/L  Comprehensive metabolic panel  Result Value Ref Range    Sodium 135 135 - 145 mmol/L   Potassium 3.9 3.5 - 5.1 mmol/L   Chloride 102 98 - 111 mmol/L   CO2 18 (L) 22 - 32 mmol/L   Glucose, Bld 207 (H) 70 - 99 mg/dL   BUN 39 (H) 8 - 23 mg/dL   Creatinine, Ser 3.12 (H) 0.61 - 1.24 mg/dL   Calcium 9.5 8.9 - 10.3 mg/dL   Total Protein 8.3 (H) 6.5 - 8.1 g/dL   Albumin 3.9 3.5 - 5.0 g/dL   AST 48 (H) 15 - 41 U/L   ALT 52 (H) 0 - 44 U/L   Alkaline Phosphatase 88 38 - 126 U/L   Total Bilirubin 2.0 (H) 0.3 - 1.2 mg/dL   GFR, Estimated 21 (L) >60 mL/min   Anion gap 15 5 - 15  CBC  Result Value Ref Range   WBC 27.0 (H) 4.0 - 10.5 K/uL   RBC 6.02 (H) 4.22 - 5.81 MIL/uL   Hemoglobin 18.0 (H) 13.0 - 17.0 g/dL   HCT 52.0 39.0 - 52.0 %   MCV 86.4 80.0 - 100.0 fL   MCH 29.9 26.0 - 34.0 pg   MCHC 34.6 30.0 - 36.0 g/dL   RDW 12.8 11.5 - 15.5 %   Platelets 310 150 - 400 K/uL   nRBC 0.0 0.0 - 0.2 %  Lactic acid, plasma  Result Value Ref Range   Lactic Acid, Venous 2.4 (HH) 0.5 - 1.9 mmol/L  Lactic acid, plasma  Result Value Ref Range   Lactic Acid, Venous 2.7 (HH) 0.5 - 1.9 mmol/L  CBC  Result Value Ref Range   WBC 16.4 (H) 4.0 - 10.5 K/uL   RBC 4.86 4.22 - 5.81 MIL/uL   Hemoglobin 14.6 13.0 - 17.0 g/dL   HCT 43.0 39.0 -  52.0 %   MCV 88.5 80.0 - 100.0 fL   MCH 30.0 26.0 - 34.0 pg   MCHC 34.0 30.0 - 36.0 g/dL   RDW 12.9 11.5 - 15.5 %   Platelets 238 150 - 400 K/uL   nRBC 0.0 0.0 - 0.2 %  Comprehensive metabolic panel  Result Value Ref Range   Sodium 136 135 - 145 mmol/L   Potassium 3.6 3.5 - 5.1 mmol/L   Chloride 104 98 - 111 mmol/L   CO2 21 (L) 22 - 32 mmol/L   Glucose, Bld 131 (H) 70 - 99 mg/dL   BUN 46 (H) 8 - 23 mg/dL   Creatinine, Ser 3.13 (H) 0.61 - 1.24 mg/dL   Calcium 8.4 (L) 8.9 - 10.3 mg/dL   Total Protein 6.6 6.5 - 8.1 g/dL   Albumin 3.1 (L) 3.5 - 5.0 g/dL   AST 76 (H) 15 - 41 U/L   ALT 53 (H) 0 - 44 U/L   Alkaline Phosphatase 103 38 - 126 U/L   Total Bilirubin 2.2 (H) 0.3 - 1.2 mg/dL   GFR, Estimated 21 (L) >60 mL/min    Anion gap 11 5 - 15  Magnesium  Result Value Ref Range   Magnesium 2.0 1.7 - 2.4 mg/dL  Lactic acid, plasma  Result Value Ref Range   Lactic Acid, Venous 1.4 0.5 - 1.9 mmol/L  HIV Antibody (routine testing w rflx)  Result Value Ref Range   HIV Screen 4th Generation wRfx Non Reactive Non Reactive  Comprehensive metabolic panel  Result Value Ref Range   Sodium 135 135 - 145 mmol/L   Potassium 3.3 (L) 3.5 - 5.1 mmol/L   Chloride 106 98 - 111 mmol/L   CO2 22 22 - 32 mmol/L   Glucose, Bld 126 (H) 70 - 99 mg/dL   BUN 44 (H) 8 - 23 mg/dL   Creatinine, Ser 2.16 (H) 0.61 - 1.24 mg/dL   Calcium 7.9 (L) 8.9 - 10.3 mg/dL   Total Protein 5.8 (L) 6.5 - 8.1 g/dL   Albumin 2.7 (L) 3.5 - 5.0 g/dL   AST 67 (H) 15 - 41 U/L   ALT 47 (H) 0 - 44 U/L   Alkaline Phosphatase 115 38 - 126 U/L   Total Bilirubin 1.3 (H) 0.3 - 1.2 mg/dL   GFR, Estimated 33 (L) >60 mL/min   Anion gap 7 5 - 15  CBC  Result Value Ref Range   WBC 10.7 (H) 4.0 - 10.5 K/uL   RBC 4.38 4.22 - 5.81 MIL/uL   Hemoglobin 13.1 13.0 - 17.0 g/dL   HCT 38.6 (L) 39.0 - 52.0 %   MCV 88.1 80.0 - 100.0 fL   MCH 29.9 26.0 - 34.0 pg   MCHC 33.9 30.0 - 36.0 g/dL   RDW 13.1 11.5 - 15.5 %   Platelets 254 150 - 400 K/uL   nRBC 0.0 0.0 - 0.2 %  TSH  Result Value Ref Range   TSH 0.160 (L) 0.350 - 4.500 uIU/mL  Magnesium  Result Value Ref Range   Magnesium 2.0 1.7 - 2.4 mg/dL  Glucose, capillary  Result Value Ref Range   Glucose-Capillary 110 (H) 70 - 99 mg/dL  Glucose, capillary  Result Value Ref Range   Glucose-Capillary 155 (H) 70 - 99 mg/dL  Glucose, capillary  Result Value Ref Range   Glucose-Capillary 116 (H) 70 - 99 mg/dL  T4, free  Result Value Ref Range   Free T4 0.92 0.61 - 1.12 ng/dL  Glucose, capillary  Result Value Ref Range   Glucose-Capillary 138 (H) 70 - 99 mg/dL  Glucose, capillary  Result Value Ref Range   Glucose-Capillary 131 (H) 70 - 99 mg/dL  Comprehensive metabolic panel  Result Value Ref Range    Sodium 140 135 - 145 mmol/L   Potassium 3.5 3.5 - 5.1 mmol/L   Chloride 107 98 - 111 mmol/L   CO2 25 22 - 32 mmol/L   Glucose, Bld 113 (H) 70 - 99 mg/dL   BUN 37 (H) 8 - 23 mg/dL   Creatinine, Ser 1.47 (H) 0.61 - 1.24 mg/dL   Calcium 8.2 (L) 8.9 - 10.3 mg/dL   Total Protein 6.2 (L) 6.5 - 8.1 g/dL   Albumin 2.7 (L) 3.5 - 5.0 g/dL   AST 67 (H) 15 - 41 U/L   ALT 47 (H) 0 - 44 U/L   Alkaline Phosphatase 106 38 - 126 U/L   Total Bilirubin 1.2 0.3 - 1.2 mg/dL   GFR, Estimated 53 (L) >60 mL/min   Anion gap 8 5 - 15  CBC  Result Value Ref Range   WBC 7.8 4.0 - 10.5 K/uL   RBC 4.38 4.22 - 5.81 MIL/uL   Hemoglobin 12.9 (L) 13.0 - 17.0 g/dL   HCT 38.3 (L) 39.0 - 52.0 %   MCV 87.4 80.0 - 100.0 fL   MCH 29.5 26.0 - 34.0 pg   MCHC 33.7 30.0 - 36.0 g/dL   RDW 12.8 11.5 - 15.5 %   Platelets 268 150 - 400 K/uL   nRBC 0.0 0.0 - 0.2 %  Glucose, capillary  Result Value Ref Range   Glucose-Capillary 110 (H) 70 - 99 mg/dL  Glucose, capillary  Result Value Ref Range   Glucose-Capillary 104 (H) 70 - 99 mg/dL   Comment 1 Notify RN    Comment 2 Document in Chart   Glucose, capillary  Result Value Ref Range   Glucose-Capillary 132 (H) 70 - 99 mg/dL  CBG monitoring, ED  Result Value Ref Range   Glucose-Capillary 139 (H) 70 - 99 mg/dL  CBG monitoring, ED  Result Value Ref Range   Glucose-Capillary 166 (H) 70 - 99 mg/dL  CBG monitoring, ED  Result Value Ref Range   Glucose-Capillary 132 (H) 70 - 99 mg/dL  CBG monitoring, ED  Result Value Ref Range   Glucose-Capillary 123 (H) 70 - 99 mg/dL      Assessment & Plan:   Problem List Items Addressed This Visit    Hyperlipidemia associated with type 2 diabetes mellitus ([place])   Relevant Orders   COMPLETE METABOLIC PANEL WITH GFR   Lipid panel   Acute cholecystitis - Primary   Relevant Orders   CBC with Differential/Platelet   COMPLETE METABOLIC PANEL WITH GFR    Other Visit Diagnoses    Insomnia, unspecified type       Relevant  Medications   zolpidem (AMBIEN) 5 MG tablet     Acute cholecystitis - improved Completed hospitalization / antibiotics and now oral antibiotic course Now s/p biliary drain placement, upcoming imaging IR 3/24 then follow-up with Gen Surgery Dr Celine Ahr in 08/2020 - anticipate future cholecystectomy  Re-check labs at HFU - CMET + CBC  #HLD Will check lipid panel today  #Insomnia Related to positional symptoms with drain in place, will order temporary course ambien PRN for sleeping while he has drain.   Meds ordered this encounter  Medications   zolpidem (AMBIEN) 5 MG tablet    Sig: Take  1 tablet (5 mg total) by mouth at bedtime as needed for sleep.    Dispense:  20 tablet    Refill:  1    Follow up plan: Return in about 3 months (around 10/21/2020) for 3 month follow-up DM A1c, HLD.   Nobie Putnam, DO [facility] 07/21/2020, 11:01 AM

## 2020-07-22 LAB — CBC WITH DIFFERENTIAL/PLATELET
Absolute Monocytes: 544 cells/uL (ref 200–950)
Basophils Absolute: 77 cells/uL (ref 0–200)
Basophils Relative: 0.9 %
Eosinophils Absolute: 170 cells/uL (ref 15–500)
Eosinophils Relative: 2 %
HCT: 44.6 % (ref 38.5–50.0)
Hemoglobin: 15 g/dL (ref 13.2–17.1)
Lymphs Abs: 2261 cells/uL (ref 850–3900)
MCH: 29.5 pg (ref 27.0–33.0)
MCHC: 33.6 g/dL (ref 32.0–36.0)
MCV: 87.8 fL (ref 80.0–100.0)
MPV: 9.8 fL (ref 7.5–12.5)
Monocytes Relative: 6.4 %
Neutro Abs: 5449 cells/uL (ref 1500–7800)
Neutrophils Relative %: 64.1 %
Platelets: 486 10*3/uL — ABNORMAL HIGH (ref 140–400)
RBC: 5.08 10*6/uL (ref 4.20–5.80)
RDW: 12.6 % (ref 11.0–15.0)
Total Lymphocyte: 26.6 %
WBC: 8.5 10*3/uL (ref 3.8–10.8)

## 2020-07-22 LAB — COMPLETE METABOLIC PANEL WITH GFR
AG Ratio: 1.2 (calc) (ref 1.0–2.5)
ALT: 19 U/L (ref 9–46)
AST: 24 U/L (ref 10–35)
Albumin: 3.7 g/dL (ref 3.6–5.1)
Alkaline phosphatase (APISO): 92 U/L (ref 35–144)
BUN: 13 mg/dL (ref 7–25)
CO2: 26 mmol/L (ref 20–32)
Calcium: 9 mg/dL (ref 8.6–10.3)
Chloride: 102 mmol/L (ref 98–110)
Creat: 1.1 mg/dL (ref 0.70–1.25)
GFR, Est African American: 81 mL/min/{1.73_m2} (ref >=60)
GFR, Est Non African American: 70 mL/min/{1.73_m2} (ref >=60)
Globulin: 3.1 g/dL (calc) (ref 1.9–3.7)
Glucose, Bld: 92 mg/dL (ref 65–99)
Potassium: 4.5 mmol/L (ref 3.5–5.3)
Sodium: 137 mmol/L (ref 135–146)
Total Bilirubin: 0.5 mg/dL (ref 0.2–1.2)
Total Protein: 6.8 g/dL (ref 6.1–8.1)

## 2020-07-22 LAB — LIPID PANEL
Cholesterol: 105 mg/dL (ref <200)
HDL: 33 mg/dL — ABNORMAL LOW (ref >=40)
LDL Cholesterol (Calc): 45 mg/dL (calc)
Non-HDL Cholesterol (Calc): 72 mg/dL (calc) (ref <130)
Total CHOL/HDL Ratio: 3.2 (calc) (ref <5.0)
Triglycerides: 198 mg/dL — ABNORMAL HIGH (ref <150)

## 2020-08-07 ENCOUNTER — Ambulatory Visit
Admission: RE | Admit: 2020-08-07 | Discharge: 2020-08-07 | Disposition: A | Discharge status: Home / Self Care | Payer: Medicare Other | Source: Ambulatory Visit | Attending: General Surgery | Admitting: General Surgery

## 2020-08-07 DIAGNOSIS — K810 Acute cholecystitis: Secondary | ICD-10-CM

## 2020-08-07 DIAGNOSIS — Z934 Other artificial openings of gastrointestinal tract status: Secondary | ICD-10-CM | POA: Diagnosis not present

## 2020-08-07 DIAGNOSIS — Z434 Encounter for attention to other artificial openings of digestive tract: Secondary | ICD-10-CM | POA: Diagnosis not present

## 2020-08-07 HISTORY — PX: IR RADIOLOGIST EVAL & MGMT: IMG5224

## 2020-08-07 HISTORY — PX: IR CHOLANGIOGRAM EXISTING TUBE: IMG6040

## 2020-08-07 NOTE — Progress Notes (Signed)
Referring Physician(s): [name]  Chief Complaint: The patient is seen in follow up today s/p percutaneous cholecystostomy placed 07/10/20  History of present illness:  Richard Oconnell, 65 year old male, has a medical history significant for HTN, DM2 and bladder cancer (2019; s/p tumor resection). He presented to the [facility] ED 07/09/20 with worsening abdominal pain following a colonoscopy. He was hypotensive and found to be in new onset atrial fibrillation with RVR. Labs were pertinent for AKI, leukocytosis and hyperbilirubinemia. CT imaging revealed acute cholecystitis and IR was requested by the surgical team to place a percutaneous cholecystostomy. He was discharged home 07/12/20 with outpatient plans for a cholecystectomy.   Richard Oconnell presents today to the outpatient clinic for a cholangiogram/drain evaluation. He has been afebrile and denies any abdominal pain or discomfort. He is no longer on any antibiotics and has been flushing the drain once daily with 10 ml of NS. He reports a scant amount of daily output, usually 3-4 ml/day of light green fluid.   Past Medical History:  Diagnosis Date  . Cancer [facility])    Bladder Cancer  . Erectile dysfunction associated with type 2 diabetes mellitus ([place]) 05/26/2016  . Gross hematuria 11/16/2016  . Hypertension 06/11/2015  . Type 2 diabetes mellitus with diabetic polyneuropathy, without long-term current use of insulin ([place]) 06/11/2015    Past Surgical History:  Procedure Laterality Date  . boil lanceted     hand  . COLONOSCOPY WITH PROPOFOL N/A 07/03/2020   Procedure: COLONOSCOPY WITH PROPOFOL;  Surgeon: Jonathon Bellows, MD;  Location: [facility] ENDOSCOPY;  Service: Gastroenterology;  Laterality: N/A;  . CYSTOSCOPY W/ RETROGRADES Bilateral 12/06/2016   Procedure: CYSTOSCOPY WITH RETROGRADE PYELOGRAM;  Surgeon: Hollice Espy, MD;  Location: [facility] ORS;  Service: Urology;  Laterality: Bilateral;  . CYSTOSCOPY W/ RETROGRADES Bilateral 06/27/2017    Procedure: CYSTOSCOPY WITH RETROGRADE PYELOGRAM;  Surgeon: Hollice Espy, MD;  Location: [facility] ORS;  Service: Urology;  Laterality: Bilateral;  . CYSTOSCOPY WITH BIOPSY N/A 06/27/2017   Procedure: CYSTOSCOPY WITH Bladder BIOPSY;  Surgeon: Hollice Espy, MD;  Location: [facility] ORS;  Service: Urology;  Laterality: N/A;  . CYSTOSCOPY WITH BIOPSY N/A 10/24/2017   Procedure: CYSTOSCOPY WITH Bladder BIOPSY;  Surgeon: Hollice Espy, MD;  Location: [facility] ORS;  Service: Urology;  Laterality: N/A;  . ingrown  Bilateral    ingrown toenail  . IR BILIARY DRAIN PLACEMENT WITH CHOLANGIOGRAM  07/10/2020  . TRANSURETHRAL RESECTION OF BLADDER TUMOR N/A 12/06/2016   Procedure: TRANSURETHRAL RESECTION OF BLADDER TUMOR (TURBT) (2-5cm) CLOT EVACUATION;  Surgeon: Hollice Espy, MD;  Location: [facility] ORS;  Service: Urology;  Laterality: N/A;  . TRANSURETHRAL RESECTION OF BLADDER TUMOR N/A 06/27/2017   Procedure: TRANSURETHRAL RESECTION OF BLADDER TUMOR (TURBT);  Surgeon: Hollice Espy, MD;  Location: [facility] ORS;  Service: Urology;  Laterality: N/A;  . TRANSURETHRAL RESECTION OF BLADDER TUMOR WITH MITOMYCIN-C N/A 01/18/2017   Procedure: TRANSURETHRAL RESECTION OF BLADDER TUMOR WITH MITOMYCIN-C-(SMALL);  Surgeon: Hollice Espy, MD;  Location: [facility] ORS;  Service: Urology;  Laterality: N/A;    Allergies: Cyclobenzaprine and Lisinopril  Medications: Prior to Admission medications   Medication Sig Start Date End Date Taking? Authorizing Provider  acetaminophen (TYLENOL) 325 MG tablet Take 1 tablet (325 mg total) by mouth every 6 (six) hours as needed for mild pain, fever or headache (or Fever >/= 101). 07/12/20   Cherene Altes, MD  amoxicillin-clavulanate (AUGMENTIN) 875-125 MG tablet Take 1 tablet by mouth every 12 (twelve) hours. 07/12/20   Cherene Altes, MD  calcium carbonate (TUMS - DOSED IN MG ELEMENTAL CALCIUM) 500 MG chewable tablet Chew 2 tablets by mouth daily as needed for indigestion or heartburn.     [provider]  diphenhydramine-acetaminophen (TYLENOL PM) 25-500 MG TABS tablet Take 1-2 tablets by mouth at bedtime as needed (sleep).    [provider]  doxycycline (VIBRAMYCIN) 100 MG capsule Take 100 mg by mouth 2 (two) times daily. 07/14/20   [provider]  metoprolol tartrate (LOPRESSOR) 25 MG tablet Take 0.5 tablets (12.5 mg total) by mouth 2 (two) times daily. 07/12/20   Cherene Altes, MD  OZEMPIC, 0.25 OR 0.5 MG/DOSE, 2 MG/1.5ML SOPN Inject 1 mg into the skin once a week. 05/30/20   Karamalegos, Devonne Doughty, DO  pravastatin (PRAVACHOL) 20 MG tablet Take 1 tablet (20 mg total) by mouth daily. 05/30/20   Karamalegos, Devonne Doughty, DO  traMADol (ULTRAM) 50 MG tablet Take 1 tablet (50 mg total) by mouth every 6 (six) hours as needed for moderate pain. 07/12/20   Cherene Altes, MD  zolpidem (AMBIEN) 5 MG tablet Take 1 tablet (5 mg total) by mouth at bedtime as needed for sleep. 07/21/20   [name], DO     Family History  Problem Relation Age of Onset  . Heart attack Mother   . Heart disease Mother   . Parkinson's disease Father   . Heart attack Maternal Grandfather   . Heart disease Maternal Grandfather   . Heart attack Maternal Aunt   . Heart attack Maternal Grandmother   . Prostate cancer Neg Hx   . Bladder Cancer Neg Hx   . Kidney cancer Neg Hx     Social History   Socioeconomic History  . Marital status: Divorced    Spouse name: Not on file  . Number of children: Not on file  . Years of education: Not on file  . Highest education level: Not on file  Occupational History  . Not on file  Tobacco Use  . Smoking status: Never Smoker  . Smokeless tobacco: Never Used  . Tobacco comment: once a while had one cigar >10 years ago  Vaping Use  . Vaping Use: Never used  Substance and Sexual Activity  . Alcohol use: No    Alcohol/week: 0.0 standard drinks  . Drug use: No  . Sexual activity: Never  Other Topics Concern  . Not  on file  Social History Narrative  . Not on file   Social Determinants of Health   Financial Resource Strain: Not on file  Food Insecurity: Not on file  Transportation Needs: Not on file  Physical Activity: Not on file  Stress: Not on file  Social Connections: Not on file     Vital Signs: There were no vitals taken for this visit.  Physical Exam Constitutional:      General: He is not in acute distress. Pulmonary:     Effort: Pulmonary effort is normal.  Abdominal:     Palpations: Abdomen is soft.     Tenderness: There is no abdominal tenderness.     Comments: RUQ drain to gravity. Site is covered with dressing which is clean and dry. Drain is easily flushed.   Musculoskeletal:        General: Normal range of motion.  Skin:    General: Skin is warm and dry.  Neurological:     Mental Status: He is alert and oriented to person, place, and time.     Imaging: IR Radiologist  Eval & Mgmt  Result Date: 08/07/2020 Please refer to notes tab for details about interventional procedure. (Op Note)   Labs:  CBC: Recent Labs    07/10/20 0258 07/11/20 0603 07/12/20 0438 07/21/20 1108  WBC 16.4* 10.7* 7.8 8.5  HGB 14.6 13.1 12.9* 15.0  HCT 43.0 38.6* 38.3* 44.6  PLT 238 254 268 486*    COAGS: No results for input(s): INR, APTT in the last 8760 hours.  BMP: Recent Labs    07/10/20 0258 07/11/20 0603 07/12/20 0438 07/21/20 1108  NA 136 135 140 137  K 3.6 3.3* 3.5 4.5  CL 104 106 107 102  CO2 21* 22 25 26   GLUCOSE 131* 126* 113* 92  BUN 46* 44* 37* 13  CALCIUM 8.4* 7.9* 8.2* 9.0  CREATININE 3.13* 2.16* 1.47* 1.10  GFRNONAA 21* 33* 53* 70  GFRAA  --   --   --  81    LIVER FUNCTION TESTS: Recent Labs    07/09/20 1600 07/10/20 0258 07/11/20 0603 07/12/20 0438 07/21/20 1108  BILITOT 2.0* 2.2* 1.3* 1.2 0.5  AST 48* 76* 67* 67* 24  ALT 52* 53* 47* 47* 19  ALKPHOS 88 103 115 106  --   PROT 8.3* 6.6 5.8* 6.2* 6.8  ALBUMIN 3.9 3.1* 2.7* 2.7*  --      Assessment:  Acute cholecystitis s/p cholecystostomy 07/10/20: Cholangiogram today shows filling of the gallbladder without any filling of the cystic duct. The patient was instructed that he will need to keep his drain to gravity and continue flushing daily until he gets his gallbladder removed. He states he is meeting with Dr. Celine Ahr within the next few days and that he thinks his surgery is scheduled for September 04, 2020. It was explained to him that if his gallbladder is removed at that time he will not need to follow up with IR but if his surgery is delayed he will need to be seen for a drain exchange in approximately one month. He knows he can call the clinic with any questions or concerns.   Signed: Theresa Duty, NP 08/07/2020, 2:37 PM   Please refer to Dr. Fritz Pickerel attestation of this note for management and plan.

## 2020-08-11 ENCOUNTER — Ambulatory Visit (INDEPENDENT_AMBULATORY_CARE_PROVIDER_SITE_OTHER): Payer: Medicare Other | Admitting: Pharmacist

## 2020-08-11 DIAGNOSIS — E1142 Type 2 diabetes mellitus with diabetic polyneuropathy: Secondary | ICD-10-CM | POA: Diagnosis not present

## 2020-08-11 DIAGNOSIS — I10 Essential (primary) hypertension: Secondary | ICD-10-CM | POA: Diagnosis not present

## 2020-08-11 NOTE — Patient Instructions (Signed)
Visit Information  PATIENT GOALS: Goals Addressed            This Visit's Progress   . Pharmacy Goals       Our goal bad cholesterol, or LDL, is less than 70 . This is why it is important to continue taking your pravastatin  Please check your home blood pressure, keep a log of the results and bring this with you to your medical appointments.  Feel free to call me with any questions or concerns. I look forward to our next call!   Harlow Asa, PharmD, [place] [phone]       The patient verbalized understanding of instructions, educational materials, and care plan provided today and declined offer to receive copy of patient instructions, educational materials, and care plan.   Telephone follow up appointment with care management team member scheduled for: 09/10/2020 at 11:15 AM  Harlow Asa, PharmD, [place], [place] [phone]

## 2020-08-11 NOTE — Chronic Care Management (AMB) (Signed)
Chronic Care Management Pharmacy Note  08/11/2020 Name:  Richard Oconnell MRN:  [id number] DOB:  02-21-1956  Subjective: Richard Oconnell is an 65 y.o. year old male who is a primary patient of [name], DO.  The CCM team was consulted for assistance with disease management and care coordination needs.    Engaged with patient by telephone for follow up visit in response to provider referral for pharmacy case management and/or care coordination services.   Consent to Services:  The patient was given information about Chronic Care Management services, agreed to services, and gave verbal consent prior to initiation of services.  Please see initial visit note for detailed documentation.   Patient Care Team: [name], DO as PCP - General (Family Medicine) Dionne Rossa, Virl Diamond, [facility] as Pharmacist  Recent office visits: Office Visit with PCP on 3/7  Recent consult visits: Office Visit with [facility] on 3/3  Hospital visits: Patient admitted to [facility] 2/23 to 2/26 for acute cholecystitis, sepsis, acute kidney failure, acute urinary retention and newly diagnosedtransientatrial fibrillation with RVR  Objective:  Lab Results  Component Value Date   CREATININE 1.10 07/21/2020   CREATININE 1.47 (H) 07/12/2020   CREATININE 2.16 (H) 07/11/2020    Lab Results  Component Value Date   HGBA1C 7.2 (H) 05/30/2020       Component Value Date/Time   CHOL 105 07/21/2020 1108   TRIG 198 (H) 07/21/2020 1108   HDL 33 (L) 07/21/2020 1108   CHOLHDL 3.2 07/21/2020 1108   VLDL 51 (H) 11/24/2016 0818   LDLCALC 45 07/21/2020 1108    Hepatic Function Latest Ref Rng & Units 07/21/2020 07/12/2020 07/11/2020  Total Protein 6.1 - 8.1 g/dL 6.8 6.2(L) 5.8(L)  Albumin 3.5 - 5.0 g/dL - 2.7(L) 2.7(L)  AST 10 - 35 U/L 24 67(H) 67(H)  ALT 9 - 46 U/L 19 47(H) 47(H)  Alk Phosphatase 38 - 126 U/L - 106 115  Total Bilirubin 0.2  - 1.2 mg/dL 0.5 1.2 1.3(H)    Social History   Tobacco Use  Smoking Status Never Smoker  Smokeless Tobacco Never Used  Tobacco Comment   once a while had one cigar >10 years ago   BP Readings from Last 3 Encounters:  07/21/20 (!) 135/91  07/17/20 (!) 150/95  07/13/20 (!) 141/75   Pulse Readings from Last 3 Encounters:  07/21/20 68  07/17/20 64  07/13/20 88   Wt Readings from Last 3 Encounters:  07/21/20 255 lb 12.8 oz (116 kg)  07/17/20 259 lb 3.2 oz (117.6 kg)  07/13/20 260 lb 2.3 oz (118 kg)    Assessment: Review of patient past medical history, allergies, medications, health status, including review of consultants reports, laboratory and other test data, was performed as part of comprehensive evaluation and provision of chronic care management services.   SDOH:  (Social Determinants of Health) assessments and interventions performed: none   CCM Care Plan  Allergies  Allergen Reactions  . Cyclobenzaprine Other (See Comments)    Not improving pain and makes him agressive.  . Lisinopril Cough    Medications Reviewed Today    Reviewed by Vella Raring, [facility] (Pharmacist) on 08/11/20 at 1802  Med List Status: <None>  Medication Order Taking? Sig Documenting Provider Last Dose Status Informant  acetaminophen (TYLENOL) 325 MG tablet [id number]  Take 1 tablet (325 mg total) by mouth every 6 (six) hours as needed for mild pain, fever or headache (or Fever >/=  101). Cherene Altes, MD  Active   calcium carbonate (TUMS - DOSED IN MG ELEMENTAL CALCIUM) 500 MG chewable tablet [id number]  Chew 2 tablets by mouth daily as needed for indigestion or heartburn. [provider]  Active   diphenhydramine-acetaminophen (TYLENOL PM) 25-500 MG TABS tablet [id number]  Take 1-2 tablets by mouth at bedtime as needed (sleep). [provider]  Active   metoprolol tartrate (LOPRESSOR) 25 MG tablet [id number] Yes Take 0.5 tablets (12.5 mg total) by mouth 2 (two) times daily.  Cherene Altes, MD Taking Active   OZEMPIC, 0.25 OR 0.5 MG/DOSE, 2 MG/1.5ML SOPN [id number] Yes Inject 1 mg into the skin once a week. [name], DO Taking Active            Med Note [facility], Jaymason Ledesma A   Fri Jun 06, 2020 10:12 AM) On Thursdays  pravastatin (PRAVACHOL) 20 MG tablet [id number] Yes Take 1 tablet (20 mg total) by mouth daily. [name], DO Taking Active Self  sodium chloride flush (NS) 0.9 % injection 10 mL [id number]   Jacqualine Mau, NP  Active   traMADol (ULTRAM) 50 MG tablet [id number]  Take 1 tablet (50 mg total) by mouth every 6 (six) hours as needed for moderate pain. Cherene Altes, MD  Active   zolpidem (AMBIEN) 5 MG tablet [id number]  Take 1 tablet (5 mg total) by mouth at bedtime as needed for sleep. [name], DO  Active           Patient Active Problem List   Diagnosis Date Noted  . Metabolic syndrome 71/10/2692  . Acute cholecystitis 07/09/2020  . Class 1 obesity due to excess calories with serious comorbidity and body mass index (BMI) of 33.0 to 33.9 in adult 07/06/2019  . Malignant neoplasm of urinary bladder ([place]) 12/06/2018  . Overweight (BMI 25.0-29.9) 07/04/2018  . Hyperlipidemia associated with type 2 diabetes mellitus ([place]) 11/30/2016  . 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 Gross hematuria 11/16/2016  . Erectile dysfunction associated with type 2 diabetes mellitus ([place]) 05/26/2016  . Diabetes mellitus with coincident hypertension ([place]) 06/11/2015  . Type 2 diabetes mellitus with diabetic polyneuropathy, without long-term current use of insulin ([place]) 06/11/2015    Immunization History  Administered Date(s) Administered  . Moderna Sars-Covid-2 Vaccination 11/30/2019, 12/28/2019  . Pneumococcal Conjugate-13 05/30/2020    Conditions to be addressed/monitored: T2DM, HTN, HLD  Care Plan : PharmD - Medication Assistance  Updates made by Vella Raring, RPH since 08/11/2020 12:00 AM     Problem: Disease Progression     Long-Range Goal: Disease Progression Prevented or Minimized   Start Date: 06/06/2020  Expected End Date: 09/04/2020  This Visit's Progress: On track  Recent Progress: On track  Priority: High  Note:   Current Barriers:  . Difficulty with independently affording treatment regimen. Reports cost of Ozempic is difficult to afford, particularly when enters coverage gap of [name] o Patient APPROVED for patient assistance from [place] through 05/16/2021  Pharmacist Clinical Goal(s):  Marland Kitchen Over the next 90 days, patient will verbalize ability to afford treatment regimen through collaboration with PharmD and provider.   Interventions: . 1:1 collaboration with [name], DO regarding development and update of comprehensive plan of care as evidenced by provider attestation and co-signature . Inter-disciplinary care team collaboration (see longitudinal plan of care) . Previously collaborated with PCP on 3/1 regarding patient's diabetes medication management o Provider agreed with recommendation to hold patient's Ozempic  at that time as there is some study data to indicate that GLP-1 RAs may exacerbate gall bladder disease and CM Pharmacist followed up with patient to provide this recommendation . Perform chart review o Patient seen for Office Visit with PCP on 3/7 o Patient seen for Office Visit with [facility] on 3/3 for follow up from hospitalization. Provider advised: - Plan is for cholecystectomy in 6-8 weeks - Patient advised on care for current drain - Patient scheduled for imaging on 3/24 (completed) . Today patient denies symptoms from his gallbladder o Note patient scheduled for follow up with [facility] tomorrow o Confirms completed courses of doxycycline and Augmentin  Type 2 Diabetes: . Current treatment: o Ozempic 46m weekly on Thursdays - Patient reports he forgot our  conversation regarding holding Ozempic and resumed taking weekly . Reports recent home fasting readings ranging: 90-120 o This morning: 118 . Consult with Dr. KParks Rangerregarding patient's diabetes management.  o Note patient's preference to continue Ozempic o Provider advises okay for patient to stay on Ozempic if he is vigilant in monitoring and follows up with our office or Dr. CCeline Ahrfor any new or worsening signs/symptoms o Follow up with patient to let him know of direction from PCP. Patient agreeable to plan and states that he will also mention his Ozempic to Dr. CCeline Ahrtomorrow during their appointment   Hypertension/ Newly Diagnosed Atrial Fibrillation . Current treatment: metoprolol 25 mg -  tablet twice daily . Reports recent home blood pressure readings ranging: 120-140/80s, HR 73-80s   Hyperlipidemia:   Controlled; current treatment: pravastatin 20 mg daily   Patient Goals/Self-Care Activities . Over the next 90 days, patient will:  - check glucose, document, and provide at future appointments - check blood pressure, document, and provide at future appointments - collaborate with provider on medication access solutions - attend medical appointments as scheduled  Next appointment with Dr. CCeline Ahrwith AEstillscheduled for tomorrow  Follow Up Plan:  Telephone follow up appointment with care management team member scheduled for: 4/27 at 11:15 am     Medication Assistance:Patient APPROVED for patient assistance from [place] 05/16/2021  Patient's preferred pharmacy is:  [address]Phone: [phone]: [address]Phone: [phone] Fax: [phone]  Follow Up:  Patient agrees to Care Plan and Follow-up.  Plan: Telephone follow up appointment with care management  team member scheduled for:  4/27 at 11:15 am  EHarlow Asa PharmD, [phone]

## 2020-08-12 ENCOUNTER — Telehealth: Payer: Self-pay

## 2020-08-12 ENCOUNTER — Telehealth: Payer: Self-pay | Admitting: General Surgery

## 2020-08-12 ENCOUNTER — Encounter: Payer: Self-pay | Admitting: General Surgery

## 2020-08-12 ENCOUNTER — Other Ambulatory Visit: Payer: Self-pay

## 2020-08-12 ENCOUNTER — Ambulatory Visit: Payer: Self-pay | Admitting: General Surgery

## 2020-08-12 ENCOUNTER — Ambulatory Visit (INDEPENDENT_AMBULATORY_CARE_PROVIDER_SITE_OTHER): Payer: Medicare Other | Admitting: General Surgery

## 2020-08-12 VITALS — BP 168/102 | HR 94 | Temp 97.8°F | Ht 76.0 in | Wt 249.6 lb

## 2020-08-12 DIAGNOSIS — K810 Acute cholecystitis: Secondary | ICD-10-CM | POA: Diagnosis not present

## 2020-08-12 NOTE — Telephone Encounter (Signed)
Patient has been advised of Pre-Admission date/time, COVID Testing date and Surgery date.  Surgery Date: 09/08/20 Preadmission Testing Date: 08/28/20 (phone 1p-5p) Covid Testing Date: 09/04/20 in person @ 8:30 am  - patient advised to go to the [facility] ([place])   Patient has been made aware to call [phone], between 1-3:00pm the day before surgery, to find out what time to arrive for surgery.

## 2020-08-12 NOTE — H&P (View-Only) (Signed)
[name], male   DOB: 1956-03-05, 65 y.o.   MRN: [id number]  Chief Complaint  Patient presents with  . Follow-up    Cholecystitis-cholangiogram     HPI Richard Oconnell is a 65 y.o. male.   He is here for follow-up of his acute cholecystitis.  He was hospitalized in February with a multiday history of right upper quadrant abdominal pain.  Imaging was consistent with acute cholecystitis, but due to the degree of inflammation surrounding the gallbladder, it was felt most prudent to place a percutaneous cholecystostomy tube and plan for interval cholecystectomy once the inflammation had resolved.  He recently underwent a tube cholangiogram that demonstrated that his cystic duct was still blocked.  He is here for follow-up after that study.  He denies any fevers or chills.  No nausea or vomiting.  No diarrhea.  His appetite is somewhat diminished but otherwise he feels fine.  His main complaint is that he does not get a good night sleep if he happens to roll over on his drain.  He continues to flush it as recommended.  He states that there is minimal output from the drain. He is interested in discussing plans for surgery.   Past Medical History:  Diagnosis Date  . Cancer [facility])    Bladder Cancer  . Erectile dysfunction associated with type 2 diabetes mellitus ([place]) 05/26/2016  . Gross hematuria 11/16/2016  . Hypertension 06/11/2015  . Type 2 diabetes mellitus with diabetic polyneuropathy, without long-term current use of insulin ([place]) 06/11/2015    Past Surgical History:  Procedure Laterality Date  . boil lanceted     hand  . COLONOSCOPY WITH PROPOFOL N/A 07/03/2020   Procedure: COLONOSCOPY WITH PROPOFOL;  Surgeon: Jonathon Bellows, MD;  Location: [facility] ENDOSCOPY;  Service: Gastroenterology;  Laterality: N/A;  . CYSTOSCOPY W/ RETROGRADES Bilateral 12/06/2016   Procedure: CYSTOSCOPY WITH RETROGRADE PYELOGRAM;  Surgeon: Hollice Espy, MD;  Location: [facility] ORS;  Service: Urology;   Laterality: Bilateral;  . CYSTOSCOPY W/ RETROGRADES Bilateral 06/27/2017   Procedure: CYSTOSCOPY WITH RETROGRADE PYELOGRAM;  Surgeon: Hollice Espy, MD;  Location: [facility] ORS;  Service: Urology;  Laterality: Bilateral;  . CYSTOSCOPY WITH BIOPSY N/A 06/27/2017   Procedure: CYSTOSCOPY WITH Bladder BIOPSY;  Surgeon: Hollice Espy, MD;  Location: [facility] ORS;  Service: Urology;  Laterality: N/A;  . CYSTOSCOPY WITH BIOPSY N/A 10/24/2017   Procedure: CYSTOSCOPY WITH Bladder BIOPSY;  Surgeon: Hollice Espy, MD;  Location: [facility] ORS;  Service: Urology;  Laterality: N/A;  . ingrown  Bilateral    ingrown toenail  . IR BILIARY DRAIN PLACEMENT WITH CHOLANGIOGRAM  07/10/2020  . IR CHOLANGIOGRAM EXISTING TUBE  08/07/2020  . IR RADIOLOGIST EVAL & MGMT  08/07/2020  . TRANSURETHRAL RESECTION OF BLADDER TUMOR N/A 12/06/2016   Procedure: TRANSURETHRAL RESECTION OF BLADDER TUMOR (TURBT) (2-5cm) CLOT EVACUATION;  Surgeon: Hollice Espy, MD;  Location: [facility] ORS;  Service: Urology;  Laterality: N/A;  . TRANSURETHRAL RESECTION OF BLADDER TUMOR N/A 06/27/2017   Procedure: TRANSURETHRAL RESECTION OF BLADDER TUMOR (TURBT);  Surgeon: Hollice Espy, MD;  Location: [facility] ORS;  Service: Urology;  Laterality: N/A;  . TRANSURETHRAL RESECTION OF BLADDER TUMOR WITH MITOMYCIN-C N/A 01/18/2017   Procedure: TRANSURETHRAL RESECTION OF BLADDER TUMOR WITH MITOMYCIN-C-(SMALL);  Surgeon: Hollice Espy, MD;  Location: [facility] ORS;  Service: Urology;  Laterality: N/A;    Family History  Problem Relation Age of Onset  . Heart attack Mother   . Heart disease Mother   . Parkinson's disease Father   .  Heart attack Maternal Grandfather   . Heart disease Maternal Grandfather   . Heart attack Maternal Aunt   . Heart attack Maternal Grandmother   . Prostate cancer Neg Hx   . Bladder Cancer Neg Hx   . Kidney cancer Neg Hx     Social History Social History   Tobacco Use  . Smoking status: Never Smoker  . Smokeless tobacco: Never Used  .  Tobacco comment: once a while had one cigar >10 years ago  Vaping Use  . Vaping Use: Never used  Substance Use Topics  . Alcohol use: No    Alcohol/week: 0.0 standard drinks  . Drug use: No    Allergies  Allergen Reactions  . Cyclobenzaprine Other (See Comments)    Not improving pain and makes him agressive.  . Lisinopril Cough    Current Outpatient Medications  Medication Sig Dispense Refill  . acetaminophen (TYLENOL) 325 MG tablet Take 1 tablet (325 mg total) by mouth every 6 (six) hours as needed for mild pain, fever or headache (or Fever >/= 101).    . calcium carbonate (TUMS - DOSED IN MG ELEMENTAL CALCIUM) 500 MG chewable tablet Chew 2 tablets by mouth daily as needed for indigestion or heartburn.    . diphenhydramine-acetaminophen (TYLENOL PM) 25-500 MG TABS tablet Take 1-2 tablets by mouth at bedtime as needed (sleep).    . metoprolol tartrate (LOPRESSOR) 25 MG tablet Take 0.5 tablets (12.5 mg total) by mouth 2 (two) times daily. 60 tablet 0  . OZEMPIC, 0.25 OR 0.5 MG/DOSE, 2 MG/1.5ML SOPN Inject 1 mg into the skin once a week. 1.5 mL 0  . pravastatin (PRAVACHOL) 20 MG tablet Take 1 tablet (20 mg total) by mouth daily. 90 tablet 1  . traMADol (ULTRAM) 50 MG tablet Take 1 tablet (50 mg total) by mouth every 6 (six) hours as needed for moderate pain. 15 tablet 0  . zolpidem (AMBIEN) 5 MG tablet Take 1 tablet (5 mg total) by mouth at bedtime as needed for sleep. 20 tablet 1   Current Facility-Administered Medications  Medication Dose Route Frequency Provider Last Rate Last Admin  . sodium chloride flush (NS) 0.9 % injection 10 mL  10 mL Intravenous Daily Jacqualine Mau, NP        Review of Systems Review of Systems  All other systems reviewed and are negative. Or as discussed in the history of present illness  Blood pressure (!) 168/102, pulse 94, temperature 97.8 F (36.6 C), temperature source Oral, height 6\' 4"  (1.93 m), weight 249 lb 9.6 oz (113.2 kg), SpO2 94  %. Body mass index is 30.38 kg/m.  Physical Exam Physical Exam Constitutional:      General: He is not in acute distress.    Appearance: Normal appearance. He is obese.  HENT:     Head: Normocephalic and atraumatic.     Nose:     Comments: Covered with a mask    Mouth/Throat:     Comments: Covered with a mask Eyes:     General: No scleral icterus.       Right eye: No discharge.        Left eye: No discharge.  Cardiovascular:     Rate and Rhythm: Normal rate and regular rhythm.  Pulmonary:     Effort: Pulmonary effort is normal. No respiratory distress.  Abdominal:     Palpations: Abdomen is soft.     Comments: Protuberant, consistent with his level of obesity.  The biliary drain  is in his right upper quadrant.  There is a trace amount of thin green bile in the tubing and bag.  The insertion site is clean dry and intact.  Genitourinary:    Comments: Deferred Musculoskeletal:        General: No deformity or signs of injury.  Skin:    General: Skin is warm and dry.     Coloration: Skin is not jaundiced.  Neurological:     General: No focal deficit present.     Mental Status: He is alert and oriented to person, place, and time.  Psychiatric:        Mood and Affect: Mood normal.        Behavior: Behavior normal.     Data Reviewed I reviewed the recent cholangiogram.  I concur with the radiology interpretation copied here:  INDICATION: Cholecystitis, status post cholecystostomy 07/10/2020  EXAM: CHOLECYSTOSTOMY DRAIN INJECTION  MEDICATIONS: None.  ANESTHESIA/SEDATION: Moderate Sedation Time: None.  FLUOROSCOPY TIME:  Fluoroscopy Time: 0 minutes 48 seconds (872 mGy).  COMPLICATIONS: None immediate.  PROCEDURE: Informed written consent was obtained from the patient after a thorough discussion of the procedural risks, benefits and alternatives. All questions were addressed. Maximal Sterile Barrier Technique was utilized including caps, mask, sterile  gowns, sterile gloves, sterile drape, hand hygiene and skin antiseptic. A timeout was performed prior to the initiation of the procedure.  Existing cholecystostomy was injected with contrast. Fluoroscopic imaging performed. Cholecystostomy remains within the collapsed gallbladder. Cystic duct not visualized consistent with cystic duct obstruction.  IMPRESSION: Stable cholecystostomy position.  Positive exam for cystic duct obstruction.  Assessment This is a 65 year old man who was hospitalized with acute cholecystitis.  He was temporized with a percutaneous cholecystostomy tube.  Recent cholangiogram via the tube demonstrates that the cystic duct is still obstructed.  He needs to keep the tube in place for now.  I have offered him a robot-assisted laparoscopic cholecystectomy, but would like to defer this until it has been roughly 2 months since the time of his initial presentation.  I discussed the risks of surgery with him today.  These include, but are not limited to, bleeding, infection, damage to surrounding tissues or structures (liver, bowel, blood vessels, common bile duct), retained stones or bile leak necessitating additional procedures or interventions, need to convert to an open operation.  Plan We will work on getting him scheduled in about a month.  In the meantime, he should continue to flush his drain.  We will obtain medical clearance from Dr. Parks Ranger prior to his surgical procedure.    [name] 08/12/2020, 11:25 AM

## 2020-08-12 NOTE — Telephone Encounter (Signed)
Patient states that Dr. Celine Ahr states that he doesn't need to stop the Ozempic prior to surgery.

## 2020-08-12 NOTE — Telephone Encounter (Signed)
Medical Clearance is faxed to [place] at this time. [phone]

## 2020-08-12 NOTE — Patient Instructions (Addendum)
Our surgery scheduler will call you within 24-48 hours to chedule your surgery. Please have the [facility] surgery sheet available when speaking with her.    Continue to flush and monitor the output of your drain.  Minimally Invasive Cholecystectomy Minimally invasive cholecystectomy is surgery to remove the gallbladder. The gallbladder is a pear-shaped organ that lies beneath the liver on the right side of the body. The gallbladder stores bile, which is a fluid that helps the body digest fats. Cholecystectomy is often done to treat inflammation of the gallbladder (cholecystitis). This condition is usually caused by a buildup of gallstones (cholelithiasis) in the gallbladder. Gallstones can block the flow of bile, which can result in inflammation and pain. In severe cases, emergency surgery may be required. This procedure is done though small incisions in the abdomen, instead of one large incision. It is also called laparoscopic surgery. A thin scope with a camera (laparoscope) is inserted through one incision. Then surgical instruments are inserted through the other incisions. In some cases, a minimally invasive surgery may need to be changed to a surgery that is done through a larger incision. This is called open surgery. Tell a health care provider about:  Any allergies you have.  All medicines you are taking, including vitamins, herbs, eye drops, creams, and over-the-counter medicines.  Any problems you or family members have had with anesthetic medicines.  Any blood disorders you have.  Any surgeries you have had.  Any medical conditions you have.  Whether you are pregnant or may be pregnant. What are the risks? Generally, this is a safe procedure. However, problems may occur, including:  Infection.  Bleeding.  Allergic reactions to medicines.  Damage to nearby structures or organs.  A stone remaining in the common bile duct. The common bile duct carries bile from the gallbladder into  the small intestine.  A bile leak from the cyst duct that is clipped when your gallbladder is removed. What happens before the procedure? Staying hydrated Follow instructions from your health care provider about hydration, which may include:  Up to 2 hours before the procedure - you may continue to drink clear liquids, such as water, clear fruit juice, black coffee, and plain tea.   Eating and drinking restrictions Follow instructions from your health care provider about eating and drinking, which may include:  8 hours before the procedure - stop eating heavy meals or foods, such as meat, fried foods, or fatty foods.  6 hours before the procedure - stop eating light meals or foods, such as toast or cereal.  6 hours before the procedure - stop drinking milk or drinks that contain milk.  2 hours before the procedure - stop drinking clear liquids. Medicines Ask your health care provider about:  Changing or stopping your regular medicines. This is especially important if you are taking diabetes medicines or blood thinners.  Taking medicines such as aspirin and ibuprofen. These medicines can thin your blood. Do not take these medicines unless your health care provider tells you to take them.  Taking over-the-counter medicines, vitamins, herbs, and supplements. General instructions  Let your health care provider know if you develop a cold or an infection before surgery.  Plan to have someone take you home from the hospital or clinic.  If you will be going home right after the procedure, plan to have someone with you for 24 hours.  Ask your health care provider: ? How your surgery site will be marked. ? What steps will be taken  to help prevent infection. These may include:  Removing hair at the surgery site.  Washing skin with a germ-killing soap.  Taking antibiotic medicine. What happens during the procedure?  An IV will be inserted into one of your veins.  You will be given  one or both of the following: ? A medicine to help you relax (sedative). ? A medicine to make you fall asleep (general anesthetic).  A breathing tube will be placed in your mouth.  Your surgeon will make several small incisions in your abdomen.  The laparoscope will be inserted through one of the small incisions. The camera on the laparoscope will send images to a monitor in the operating room. This lets your surgeon see inside your abdomen.  A gas will be pumped into your abdomen. This will expand your abdomen to give the surgeon more room to perform the surgery.  Other tools that are needed for the procedure will be inserted through the other incisions. The gallbladder will be removed through one of the incisions.  Your common bile duct may be examined. If stones are found in the common bile duct, they may be removed.  After your gallbladder has been removed, the incisions will be closed with stitches (sutures), staples, or skin glue.  Your incisions may be covered with a bandage (dressing). The procedure may vary among health care providers and hospitals.   What happens after the procedure?  Your blood pressure, heart rate, breathing rate, and blood oxygen level will be monitored until you leave the hospital or clinic.  You will be given medicines as needed to control your pain.  If you were given a sedative during the procedure, it can affect you for several hours. Do not drive or operate machinery until your health care provider says that it is safe. Summary  Minimally invasive cholecystectomy, also called laparoscopic cholecystectomy, is surgery to remove the gallbladder using small incisions.  Tell your health care provider about all the medical conditions you have and all the medicines you are taking for those conditions.  Before the procedure, follow instructions about eating or drinking restrictions and changing or stopping medicines.  If you were given a sedative during  the procedure, it can affect you for several hours. Do not drive or operate machinery until your health care provider says that it is safe. This information is not intended to replace advice given to you by your health care provider. Make sure you discuss any questions you have with your health care provider. Document Revised: 02/05/2019 Document Reviewed: 02/05/2019 Elsevier Patient Education  [place].

## 2020-08-12 NOTE — Progress Notes (Signed)
[name], male   DOB: 31-Jul-1955, 65 y.o.   MRN: [id number]  Chief Complaint  Patient presents with  . Follow-up    Cholecystitis-cholangiogram     HPI Richard Oconnell is a 65 y.o. male.   He is here for follow-up of his acute cholecystitis.  He was hospitalized in February with a multiday history of right upper quadrant abdominal pain.  Imaging was consistent with acute cholecystitis, but due to the degree of inflammation surrounding the gallbladder, it was felt most prudent to place a percutaneous cholecystostomy tube and plan for interval cholecystectomy once the inflammation had resolved.  He recently underwent a tube cholangiogram that demonstrated that his cystic duct was still blocked.  He is here for follow-up after that study.  He denies any fevers or chills.  No nausea or vomiting.  No diarrhea.  His appetite is somewhat diminished but otherwise he feels fine.  His main complaint is that he does not get a good night sleep if he happens to roll over on his drain.  He continues to flush it as recommended.  He states that there is minimal output from the drain. He is interested in discussing plans for surgery.   Past Medical History:  Diagnosis Date  . Cancer [facility])    Bladder Cancer  . Erectile dysfunction associated with type 2 diabetes mellitus ([place]) 05/26/2016  . Gross hematuria 11/16/2016  . Hypertension 06/11/2015  . Type 2 diabetes mellitus with diabetic polyneuropathy, without long-term current use of insulin ([place]) 06/11/2015    Past Surgical History:  Procedure Laterality Date  . boil lanceted     hand  . COLONOSCOPY WITH PROPOFOL N/A 07/03/2020   Procedure: COLONOSCOPY WITH PROPOFOL;  Surgeon: Jonathon Bellows, MD;  Location: [facility] ENDOSCOPY;  Service: Gastroenterology;  Laterality: N/A;  . CYSTOSCOPY W/ RETROGRADES Bilateral 12/06/2016   Procedure: CYSTOSCOPY WITH RETROGRADE PYELOGRAM;  Surgeon: Hollice Espy, MD;  Location: [facility] ORS;  Service: Urology;   Laterality: Bilateral;  . CYSTOSCOPY W/ RETROGRADES Bilateral 06/27/2017   Procedure: CYSTOSCOPY WITH RETROGRADE PYELOGRAM;  Surgeon: Hollice Espy, MD;  Location: [facility] ORS;  Service: Urology;  Laterality: Bilateral;  . CYSTOSCOPY WITH BIOPSY N/A 06/27/2017   Procedure: CYSTOSCOPY WITH Bladder BIOPSY;  Surgeon: Hollice Espy, MD;  Location: [facility] ORS;  Service: Urology;  Laterality: N/A;  . CYSTOSCOPY WITH BIOPSY N/A 10/24/2017   Procedure: CYSTOSCOPY WITH Bladder BIOPSY;  Surgeon: Hollice Espy, MD;  Location: [facility] ORS;  Service: Urology;  Laterality: N/A;  . ingrown  Bilateral    ingrown toenail  . IR BILIARY DRAIN PLACEMENT WITH CHOLANGIOGRAM  07/10/2020  . IR CHOLANGIOGRAM EXISTING TUBE  08/07/2020  . IR RADIOLOGIST EVAL & MGMT  08/07/2020  . TRANSURETHRAL RESECTION OF BLADDER TUMOR N/A 12/06/2016   Procedure: TRANSURETHRAL RESECTION OF BLADDER TUMOR (TURBT) (2-5cm) CLOT EVACUATION;  Surgeon: Hollice Espy, MD;  Location: [facility] ORS;  Service: Urology;  Laterality: N/A;  . TRANSURETHRAL RESECTION OF BLADDER TUMOR N/A 06/27/2017   Procedure: TRANSURETHRAL RESECTION OF BLADDER TUMOR (TURBT);  Surgeon: Hollice Espy, MD;  Location: [facility] ORS;  Service: Urology;  Laterality: N/A;  . TRANSURETHRAL RESECTION OF BLADDER TUMOR WITH MITOMYCIN-C N/A 01/18/2017   Procedure: TRANSURETHRAL RESECTION OF BLADDER TUMOR WITH MITOMYCIN-C-(SMALL);  Surgeon: Hollice Espy, MD;  Location: [facility] ORS;  Service: Urology;  Laterality: N/A;    Family History  Problem Relation Age of Onset  . Heart attack Mother   . Heart disease Mother   . Parkinson's disease Father   .  Heart attack Maternal Grandfather   . Heart disease Maternal Grandfather   . Heart attack Maternal Aunt   . Heart attack Maternal Grandmother   . Prostate cancer Neg Hx   . Bladder Cancer Neg Hx   . Kidney cancer Neg Hx     Social History Social History   Tobacco Use  . Smoking status: Never Smoker  . Smokeless tobacco: Never Used  .  Tobacco comment: once a while had one cigar >10 years ago  Vaping Use  . Vaping Use: Never used  Substance Use Topics  . Alcohol use: No    Alcohol/week: 0.0 standard drinks  . Drug use: No    Allergies  Allergen Reactions  . Cyclobenzaprine Other (See Comments)    Not improving pain and makes him agressive.  . Lisinopril Cough    Current Outpatient Medications  Medication Sig Dispense Refill  . acetaminophen (TYLENOL) 325 MG tablet Take 1 tablet (325 mg total) by mouth every 6 (six) hours as needed for mild pain, fever or headache (or Fever >/= 101).    . calcium carbonate (TUMS - DOSED IN MG ELEMENTAL CALCIUM) 500 MG chewable tablet Chew 2 tablets by mouth daily as needed for indigestion or heartburn.    . diphenhydramine-acetaminophen (TYLENOL PM) 25-500 MG TABS tablet Take 1-2 tablets by mouth at bedtime as needed (sleep).    . metoprolol tartrate (LOPRESSOR) 25 MG tablet Take 0.5 tablets (12.5 mg total) by mouth 2 (two) times daily. 60 tablet 0  . OZEMPIC, 0.25 OR 0.5 MG/DOSE, 2 MG/1.5ML SOPN Inject 1 mg into the skin once a week. 1.5 mL 0  . pravastatin (PRAVACHOL) 20 MG tablet Take 1 tablet (20 mg total) by mouth daily. 90 tablet 1  . traMADol (ULTRAM) 50 MG tablet Take 1 tablet (50 mg total) by mouth every 6 (six) hours as needed for moderate pain. 15 tablet 0  . zolpidem (AMBIEN) 5 MG tablet Take 1 tablet (5 mg total) by mouth at bedtime as needed for sleep. 20 tablet 1   Current Facility-Administered Medications  Medication Dose Route Frequency Provider Last Rate Last Admin  . sodium chloride flush (NS) 0.9 % injection 10 mL  10 mL Intravenous Daily Jacqualine Mau, NP        Review of Systems Review of Systems  All other systems reviewed and are negative. Or as discussed in the history of present illness  Blood pressure (!) 168/102, pulse 94, temperature 97.8 F (36.6 C), temperature source Oral, height 6\' 4"  (1.93 m), weight 249 lb 9.6 oz (113.2 kg), SpO2 94  %. Body mass index is 30.38 kg/m.  Physical Exam Physical Exam Constitutional:      General: He is not in acute distress.    Appearance: Normal appearance. He is obese.  HENT:     Head: Normocephalic and atraumatic.     Nose:     Comments: Covered with a mask    Mouth/Throat:     Comments: Covered with a mask Eyes:     General: No scleral icterus.       Right eye: No discharge.        Left eye: No discharge.  Cardiovascular:     Rate and Rhythm: Normal rate and regular rhythm.  Pulmonary:     Effort: Pulmonary effort is normal. No respiratory distress.  Abdominal:     Palpations: Abdomen is soft.     Comments: Protuberant, consistent with his level of obesity.  The biliary drain  is in his right upper quadrant.  There is a trace amount of thin green bile in the tubing and bag.  The insertion site is clean dry and intact.  Genitourinary:    Comments: Deferred Musculoskeletal:        General: No deformity or signs of injury.  Skin:    General: Skin is warm and dry.     Coloration: Skin is not jaundiced.  Neurological:     General: No focal deficit present.     Mental Status: He is alert and oriented to person, place, and time.  Psychiatric:        Mood and Affect: Mood normal.        Behavior: Behavior normal.     Data Reviewed I reviewed the recent cholangiogram.  I concur with the radiology interpretation copied here:  INDICATION: Cholecystitis, status post cholecystostomy 07/10/2020  EXAM: CHOLECYSTOSTOMY DRAIN INJECTION  MEDICATIONS: None.  ANESTHESIA/SEDATION: Moderate Sedation Time: None.  FLUOROSCOPY TIME:  Fluoroscopy Time: 0 minutes 48 seconds (872 mGy).  COMPLICATIONS: None immediate.  PROCEDURE: Informed written consent was obtained from the patient after a thorough discussion of the procedural risks, benefits and alternatives. All questions were addressed. Maximal Sterile Barrier Technique was utilized including caps, mask, sterile  gowns, sterile gloves, sterile drape, hand hygiene and skin antiseptic. A timeout was performed prior to the initiation of the procedure.  Existing cholecystostomy was injected with contrast. Fluoroscopic imaging performed. Cholecystostomy remains within the collapsed gallbladder. Cystic duct not visualized consistent with cystic duct obstruction.  IMPRESSION: Stable cholecystostomy position.  Positive exam for cystic duct obstruction.  Assessment This is a 65 year old man who was hospitalized with acute cholecystitis.  He was temporized with a percutaneous cholecystostomy tube.  Recent cholangiogram via the tube demonstrates that the cystic duct is still obstructed.  He needs to keep the tube in place for now.  I have offered him a robot-assisted laparoscopic cholecystectomy, but would like to defer this until it has been roughly 2 months since the time of his initial presentation.  I discussed the risks of surgery with him today.  These include, but are not limited to, bleeding, infection, damage to surrounding tissues or structures (liver, bowel, blood vessels, common bile duct), retained stones or bile leak necessitating additional procedures or interventions, need to convert to an open operation.  Plan We will work on getting him scheduled in about a month.  In the meantime, he should continue to flush his drain.  We will obtain medical clearance from Dr. Parks Ranger prior to his surgical procedure.    Richard Oconnell 08/12/2020, 11:25 AM

## 2020-08-13 ENCOUNTER — Telehealth: Payer: Self-pay | Admitting: Family Medicine

## 2020-08-13 NOTE — Telephone Encounter (Signed)
Please notify patient that he should schedule a Pre-Op Clearance visit with me in person anytime in 1-2 weeks. We can fax paperwork once completed to his surgeon to proceed w/ scheduling his surgery  He will need updated EKG and may need updated blood work. Should be AM appointment.  Nobie Putnam, [facility] 08/13/2020, 1:28 PM

## 2020-08-14 NOTE — Telephone Encounter (Signed)
I called and spoke with the patient and notified him of Dr. Raliegh Ip request. Appt scheduled for 08/19/20.

## 2020-08-18 ENCOUNTER — Telehealth: Payer: Self-pay | Admitting: *Deleted

## 2020-08-18 NOTE — Telephone Encounter (Signed)
Spoke with patient and flushed will be at front desk for pick up.

## 2020-08-18 NOTE — Telephone Encounter (Signed)
Patient called and wanted to see if he can get some more saline flush for his drain. Last time he got a box of 30 but he only needs about 15 of them now. Please call and advise

## 2020-08-19 ENCOUNTER — Other Ambulatory Visit: Payer: Self-pay

## 2020-08-19 ENCOUNTER — Ambulatory Visit (INDEPENDENT_AMBULATORY_CARE_PROVIDER_SITE_OTHER): Payer: Medicare Other | Admitting: Family Medicine

## 2020-08-19 ENCOUNTER — Encounter: Payer: Self-pay | Admitting: Family Medicine

## 2020-08-19 VITALS — BP 141/83 | HR 72 | Ht 76.0 in | Wt 251.6 lb

## 2020-08-19 DIAGNOSIS — K811 Chronic cholecystitis: Secondary | ICD-10-CM | POA: Diagnosis not present

## 2020-08-19 DIAGNOSIS — G4700 Insomnia, unspecified: Secondary | ICD-10-CM

## 2020-08-19 DIAGNOSIS — Z01818 Encounter for other preprocedural examination: Secondary | ICD-10-CM

## 2020-08-19 MED ORDER — ZOLPIDEM TARTRATE 5 MG PO TABS: 5.0000 mg | ORAL_TABLET | Freq: Every evening | ORAL | 1 refills | Status: DC | PRN

## 2020-08-19 NOTE — Patient Instructions (Addendum)
Thank you for coming to the office today.  Proceed with gallbladder surgery cholecystectomy as scheduled w/ Dr Celine Ahr.  Refilled Zolpidem.  Please schedule a Follow-up Appointment to: Return in about 6 weeks (around 09/30/2020) for 6-8 weeks follow-up DM A1c, updates post-op gallbladder.  If you have any other questions or concerns, please feel free to call the office or send a message through [place]. You may also schedule an earlier appointment if necessary.  Additionally, you may be receiving a survey about your experience at our office within a few days to 1 week by e-mail or mail. We value your feedback.  Nobie Putnam, DO [facility]

## 2020-08-19 NOTE — Progress Notes (Addendum)
Subjective:    [name], male    DOB: 04/16/1956, 65 y.o.   MRN: [id number]  Richard Oconnell is a 65 y.o. male presenting on 08/19/2020 for Pre-op Exam and cholecystitis   HPI   PRE-OPERATIVE MEDICAL CLEARANCE / FOLLOW-UP Cholelithiasis Anticipated upcoming surgery - Laparoscopic Cholecystectomy, by Dr [name] (at [facility]) in approximately 20 days, on approx 09/08/20  Last visit with me on 07/21/20, for same problem, treated with completed oral antibiotics, Biliary Drain in place, and f/u with Gen Surgery following hospitalization with sepsis due to acute cholecystitis, see prior notes for background information. - Interval update with overall improved and has not had any further complication, now has upcoming surgery scheduled w/ Dr Celine Ahr.  Regarding surgical and anesthesia history: - Known history of several major and minor surgeries in past, and has tolerated general anesthesia well without problem, complication or allergy. No family history of problem with anesthesia - Able to tolerate regular exercise up to >4 METs, walking up flight of stairs - No known history of cardiovascular disease. Never had MI or known CAD. - No known history of pulmonary disease. Never smoked   He is Type 2 Diabetic, has been controlled.  - Denies exertional symptoms of chest pain or tightness, dyspnea, coughing, apnea, syncopal episodes, palpitations   Past Surgical History:  Procedure Laterality Date  . boil lanceted     hand  . COLONOSCOPY WITH PROPOFOL N/A 07/03/2020   Procedure: COLONOSCOPY WITH PROPOFOL;  Surgeon: Jonathon Bellows, MD;  Location: [facility] ENDOSCOPY;  Service: Gastroenterology;  Laterality: N/A;  . CYSTOSCOPY W/ RETROGRADES Bilateral 12/06/2016   Procedure: CYSTOSCOPY WITH RETROGRADE PYELOGRAM;  Surgeon: Hollice Espy, MD;  Location: [facility] ORS;  Service: Urology;  Laterality: Bilateral;  . CYSTOSCOPY W/ RETROGRADES Bilateral 06/27/2017   Procedure: CYSTOSCOPY  WITH RETROGRADE PYELOGRAM;  Surgeon: Hollice Espy, MD;  Location: [facility] ORS;  Service: Urology;  Laterality: Bilateral;  . CYSTOSCOPY WITH BIOPSY N/A 06/27/2017   Procedure: CYSTOSCOPY WITH Bladder BIOPSY;  Surgeon: Hollice Espy, MD;  Location: [facility] ORS;  Service: Urology;  Laterality: N/A;  . CYSTOSCOPY WITH BIOPSY N/A 10/24/2017   Procedure: CYSTOSCOPY WITH Bladder BIOPSY;  Surgeon: Hollice Espy, MD;  Location: [facility] ORS;  Service: Urology;  Laterality: N/A;  . ingrown  Bilateral    ingrown toenail  . IR BILIARY DRAIN PLACEMENT WITH CHOLANGIOGRAM  07/10/2020  . IR CHOLANGIOGRAM EXISTING TUBE  08/07/2020  . IR RADIOLOGIST EVAL & MGMT  08/07/2020  . TRANSURETHRAL RESECTION OF BLADDER TUMOR N/A 12/06/2016   Procedure: TRANSURETHRAL RESECTION OF BLADDER TUMOR (TURBT) (2-5cm) CLOT EVACUATION;  Surgeon: Hollice Espy, MD;  Location: [facility] ORS;  Service: Urology;  Laterality: N/A;  . TRANSURETHRAL RESECTION OF BLADDER TUMOR N/A 06/27/2017   Procedure: TRANSURETHRAL RESECTION OF BLADDER TUMOR (TURBT);  Surgeon: Hollice Espy, MD;  Location: [facility] ORS;  Service: Urology;  Laterality: N/A;  . TRANSURETHRAL RESECTION OF BLADDER TUMOR WITH MITOMYCIN-C N/A 01/18/2017   Procedure: TRANSURETHRAL RESECTION OF BLADDER TUMOR WITH MITOMYCIN-C-(SMALL);  Surgeon: Hollice Espy, MD;  Location: [facility] ORS;  Service: Urology;  Laterality: N/A;      Depression screen [facility] 2/9 05/30/2020 03/09/2019 12/06/2018  Decreased Interest 0 0 0  Down, Depressed, Hopeless 0 0 0  PHQ - 2 Score 0 0 0    Social History   Tobacco Use  . Smoking status: Never Smoker  . Smokeless tobacco: Never Used  . Tobacco comment: once a while had one cigar >10 years ago  Vaping  Use  . Vaping Use: Never used  Substance Use Topics  . Alcohol use: No    Alcohol/week: 0.0 standard drinks  . Drug use: No    Review of Systems Per HPI unless specifically indicated above     Objective:    BP (!) 141/83   Pulse 72   Ht 6\' 4"  (1.93  m)   Wt 251 lb 9.6 oz (114.1 kg)   SpO2 99%   BMI 30.63 kg/m   Wt Readings from Last 3 Encounters:  08/19/20 251 lb 9.6 oz (114.1 kg)  08/12/20 249 lb 9.6 oz (113.2 kg)  07/21/20 255 lb 12.8 oz (116 kg)    Physical Exam Vitals and nursing note reviewed.  Constitutional:      General: He is not in acute distress.    Appearance: He is well-developed. He is not diaphoretic.     Comments: Well-appearing, comfortable, cooperative  HENT:     Head: Normocephalic and atraumatic.  Eyes:     General:        Right eye: No discharge.        Left eye: No discharge.     Conjunctiva/sclera: Conjunctivae normal.  Neck:     Thyroid: No thyromegaly.  Cardiovascular:     Rate and Rhythm: Normal rate and regular rhythm.     Heart sounds: Normal heart sounds. No murmur heard.   Pulmonary:     Effort: Pulmonary effort is normal. No respiratory distress.     Breath sounds: Normal breath sounds. No wheezing or rales.  Abdominal:     Comments: Biliary drain in place, RUQ, bilious mostly clear fluid appearing in bag, limited return.  Musculoskeletal:        General: Normal range of motion.     Cervical back: Normal range of motion and neck supple.  Lymphadenopathy:     Cervical: No cervical adenopathy.  Skin:    General: Skin is warm and dry.     Findings: No erythema or rash.  Neurological:     Mental Status: He is alert and oriented to person, place, and time.  Psychiatric:        Behavior: Behavior normal.     Comments: Well groomed, good eye contact, normal speech and thoughts      EKG - performed in office today  Date: 08/19/20  Rate: 73 HR  Rhythm: normal sinus rhythm  QRS Axis: normal  Intervals: normal  ST/T Wave abnormalities: normal  Conduction Disutrbances:left anterior fascicular block  Additional Narrative Interpretation: None.  Old EKG Reviewed: changes noted, improved back to sinus rhythm. Previously atrial fibrillation RVR.  Recent Labs    12/27/19 0000  05/30/20 1047  HGBA1C 6.9 7.2*     Results for orders placed or performed in visit on 08/19/20  CBC with Differential/Platelet  Result Value Ref Range   WBC 8.8 3.8 - 10.8 Thousand/uL   RBC 5.23 4.20 - 5.80 Million/uL   Hemoglobin 14.9 13.2 - 17.1 g/dL   HCT 45.2 38.5 - 50.0 %   MCV 86.4 80.0 - 100.0 fL   MCH 28.5 27.0 - 33.0 pg   MCHC 33.0 32.0 - 36.0 g/dL   RDW 12.6 11.0 - 15.0 %   Platelets 376 140 - 400 Thousand/uL   MPV 9.6 7.5 - 12.5 fL   Neutro Abs 5,949 1,500 - 7,800 cells/uL   Lymphs Abs 2,006 850 - 3,900 cells/uL   Absolute Monocytes 572 200 - 950 cells/uL   Eosinophils Absolute 211 15 -  500 cells/uL   Basophils Absolute 62 0 - 200 cells/uL   Neutrophils Relative % 67.6 %   Total Lymphocyte 22.8 %   Monocytes Relative 6.5 %   Eosinophils Relative 2.4 %   Basophils Relative 0.7 %  COMPLETE METABOLIC PANEL WITH GFR  Result Value Ref Range   Glucose, Bld 102 (H) 65 - 99 mg/dL   BUN 18 7 - 25 mg/dL   Creat 1.10 0.70 - 1.25 mg/dL   GFR, Est Non African American 70 > OR = 60 mL/min/1.19m2   GFR, Est African American 81 > OR = 60 mL/min/1.55m2   BUN/Creatinine Ratio NOT APPLICABLE 6 - 22 (calc)   Sodium 136 135 - 146 mmol/L   Potassium 4.6 3.5 - 5.3 mmol/L   Chloride 99 98 - 110 mmol/L   CO2 28 20 - 32 mmol/L   Calcium 9.5 8.6 - 10.3 mg/dL   Total Protein 7.4 6.1 - 8.1 g/dL   Albumin 3.9 3.6 - 5.1 g/dL   Globulin 3.5 1.9 - 3.7 g/dL (calc)   AG Ratio 1.1 1.0 - 2.5 (calc)   Total Bilirubin 0.4 0.2 - 1.2 mg/dL   Alkaline phosphatase (APISO) 132 35 - 144 U/L   AST 19 10 - 35 U/L   ALT 17 9 - 46 U/L      Assessment & Plan:   Problem List Items Addressed This Visit   None   Visit Diagnoses    Pre-op evaluation    -  Primary   Relevant Orders   EKG 12-Lead   CBC with Differential/Platelet   COMPLETE METABOLIC PANEL WITH GFR   Chronic cholecystitis       Relevant Orders   CBC with Differential/Platelet   COMPLETE METABOLIC PANEL WITH GFR   Insomnia,  unspecified type       Relevant Medications   zolpidem (AMBIEN) 5 MG tablet      Pre-op clearance for non-cardiac surgery today, Laparoscopic Cholecysectomy (Low to intermediate risk), general anesthesia. - Previously tolerated prior surgeries with general anesthesia - No known cardiac hx. Appropriate functional status >4 METs - Never smoker  Plan: 1. Anticipating normal Chemistry / CBC - Cleared for scheduled surgery. Completed provided pre-op paperwork per [facility] office 2. Will check CMET for Cr monitoring / CBC - previously normal done 07/2020 3. EKG completed today, see above report, improved back to normal. 4. DM has been controlled.  Refill Zolpidem PRN usage now while still has biliary drain in place and difficulty with sleeping.   Orders Placed This Encounter  Procedures  . CBC with Differential/Platelet  . COMPLETE METABOLIC PANEL WITH GFR  . EKG 12-Lead     Meds ordered this encounter  Medications  . zolpidem (AMBIEN) 5 MG tablet    Sig: Take 1 tablet (5 mg total) by mouth at bedtime as needed for sleep.    Dispense:  20 tablet    Refill:  1     Follow up plan: Return in about 6 weeks (around 09/30/2020) for 6-8 weeks follow-up DM A1c, updates post-op gallbladder.   Nobie Putnam, [facility] 08/19/2020, 9:48 AM

## 2020-08-20 LAB — CBC WITH DIFFERENTIAL/PLATELET
Absolute Monocytes: 572 cells/uL (ref 200–950)
Basophils Absolute: 62 cells/uL (ref 0–200)
Basophils Relative: 0.7 %
Eosinophils Absolute: 211 cells/uL (ref 15–500)
Eosinophils Relative: 2.4 %
HCT: 45.2 % (ref 38.5–50.0)
Hemoglobin: 14.9 g/dL (ref 13.2–17.1)
Lymphs Abs: 2006 cells/uL (ref 850–3900)
MCH: 28.5 pg (ref 27.0–33.0)
MCHC: 33 g/dL (ref 32.0–36.0)
MCV: 86.4 fL (ref 80.0–100.0)
MPV: 9.6 fL (ref 7.5–12.5)
Monocytes Relative: 6.5 %
Neutro Abs: 5949 cells/uL (ref 1500–7800)
Neutrophils Relative %: 67.6 %
Platelets: 376 10*3/uL (ref 140–400)
RBC: 5.23 10*6/uL (ref 4.20–5.80)
RDW: 12.6 % (ref 11.0–15.0)
Total Lymphocyte: 22.8 %
WBC: 8.8 10*3/uL (ref 3.8–10.8)

## 2020-08-20 LAB — COMPLETE METABOLIC PANEL WITH GFR
AG Ratio: 1.1 (calc) (ref 1.0–2.5)
ALT: 17 U/L (ref 9–46)
AST: 19 U/L (ref 10–35)
Albumin: 3.9 g/dL (ref 3.6–5.1)
Alkaline phosphatase (APISO): 132 U/L (ref 35–144)
BUN: 18 mg/dL (ref 7–25)
CO2: 28 mmol/L (ref 20–32)
Calcium: 9.5 mg/dL (ref 8.6–10.3)
Chloride: 99 mmol/L (ref 98–110)
Creat: 1.1 mg/dL (ref 0.70–1.25)
GFR, Est African American: 81 mL/min/{1.73_m2} (ref >=60)
GFR, Est Non African American: 70 mL/min/{1.73_m2} (ref >=60)
Globulin: 3.5 g/dL (calc) (ref 1.9–3.7)
Glucose, Bld: 102 mg/dL — ABNORMAL HIGH (ref 65–99)
Potassium: 4.6 mmol/L (ref 3.5–5.3)
Sodium: 136 mmol/L (ref 135–146)
Total Bilirubin: 0.4 mg/dL (ref 0.2–1.2)
Total Protein: 7.4 g/dL (ref 6.1–8.1)

## 2020-08-21 ENCOUNTER — Telehealth: Payer: Self-pay | Admitting: Family Medicine

## 2020-08-21 NOTE — Telephone Encounter (Signed)
-----   Message from [name], DO sent at 08/20/2020  5:56 PM EDT ----- Please contact patient to review the following (No MyChart Access):  1. Chemistry - Normal results, including electrolytes, kidney and liver function.  2. CBC Blood Counts - Normal, no anemia, other abnormality  He is medically cleared to proceed with surgery. Paperwork will be faxed to his General Surgery office   Nobie Putnam, [facility] 08/20/2020, 5:55 PM

## 2020-08-21 NOTE — Progress Notes (Signed)
Medical Clearance has been received from Dr Christella Scheuermann office. The patient is cleared at Low risk for surgery.

## 2020-08-28 ENCOUNTER — Other Ambulatory Visit
Admission: RE | Admit: 2020-08-28 | Discharge: 2020-08-28 | Disposition: A | Discharge status: Home / Self Care | Payer: Medicare Other | Source: Ambulatory Visit | Attending: General Surgery | Admitting: General Surgery

## 2020-08-28 ENCOUNTER — Other Ambulatory Visit: Payer: Self-pay

## 2020-08-28 NOTE — Patient Instructions (Addendum)
Your procedure is scheduled on: 09/08/20 - Monday Report to the Registration Desk on the 1st floor of the [facility]. To find out your arrival time, please call [phone] between 1PM - 3PM on: 09/05/20 - Friday Report to [facility] 09/04/20 - for EKG   REMEMBER: Instructions that are not followed completely may result in serious medical risk, up to and including death; or upon the discretion of your surgeon and anesthesiologist your surgery may need to be rescheduled.  Do not eat food or drink any fluids after midnight the night before surgery.  No gum chewing, lozengers or hard candies.  TAKE THESE MEDICATIONS THE MORNING OF SURGERY WITH A SIP OF WATER:  - metoprolol tartrate (LOPRESSOR) 25 MG tablet - pravastatin (PRAVACHOL) 20 MG tablet  One week prior to surgery: Stop Anti-inflammatories (NSAIDS) such as Advil, Aleve, Ibuprofen, Motrin, Naproxen, Naprosyn and Aspirin based products such as Excedrin, Goodys Powder, BC Powder.  Stop ANY OVER THE COUNTER supplements until after surgery.  No Alcohol for 24 hours before or after surgery.  No Smoking including e-cigarettes for 24 hours prior to surgery.  No chewable tobacco products for at least 6 hours prior to surgery.  No nicotine patches on the day of surgery.  Do not use any "recreational" drugs for at least a week prior to your surgery.  Please be advised that the combination of cocaine and anesthesia may have negative outcomes, up to and including death. If you test positive for cocaine, your surgery will be cancelled.  On the morning of surgery brush your teeth with toothpaste and water, you may rinse your mouth with mouthwash if you wish. Do not swallow any toothpaste or mouthwash.  Do not wear jewelry, make-up, hairpins, clips or nail polish.  Do not wear lotions, powders, or perfumes.   Do not shave body from the neck down 48 hours prior to surgery just in case you cut yourself which could leave a site for  infection.  Also, freshly shaved skin may become irritated if using the CHG soap.  Contact lenses, hearing aids and dentures may not be worn into surgery.  Do not bring valuables to the hospital. [facility] is not responsible for any missing/lost belongings or valuables.   Use CHG Soap or wipes as directed on instruction sheet.  Notify your doctor if there is any change in your medical condition (cold, fever, infection).  Wear comfortable clothing (specific to your surgery type) to the hospital.  Plan for stool softeners for home use; pain medications have a tendency to cause constipation. You can also help prevent constipation by eating foods high in fiber such as fruits and vegetables and drinking plenty of fluids as your diet allows.  After surgery, you can help prevent lung complications by doing breathing exercises.  Take deep breaths and cough every 1-2 hours. Your doctor may order a device called an Incentive Spirometer to help you take deep breaths. When coughing or sneezing, hold a pillow firmly against your incision with both hands. This is called "splinting." Doing this helps protect your incision. It also decreases belly discomfort.  If you are being admitted to the hospital overnight, leave your suitcase in the car. After surgery it may be brought to your room.  If you are being discharged the day of surgery, you will not be allowed to drive home. You will need a responsible adult (18 years or older) to drive you home and stay with you that night.   If you  are taking public transportation, you will need to have a responsible adult (18 years or older) with you. Please confirm with your physician that it is acceptable to use public transportation.   Please call the [facility]. at [phone] if you have any questions about these instructions.  Surgery Visitation Policy:  Patients undergoing a surgery or procedure may have one family member or support  person with them as long as that person is not COVID-19 positive or experiencing its symptoms.  That person may remain in the waiting area during the procedure.  Inpatient Visitation:    Visiting hours are 7 a.m. to 8 p.m. Inpatients will be allowed two visitors daily. The visitors may change each day during the patient's stay. No visitors under the age of 57. Any visitor under the age of 73 must be accompanied by an adult. The visitor must pass COVID-19 screenings, use hand sanitizer when entering and exiting the patient's room and wear a mask at all times, including in the patient's room. Patients must also wear a mask when staff or their visitor are in the room. Masking is required regardless of vaccination status.

## 2020-09-03 ENCOUNTER — Ambulatory Visit (INDEPENDENT_AMBULATORY_CARE_PROVIDER_SITE_OTHER): Payer: Medicare Other | Admitting: Physician Assistant

## 2020-09-03 ENCOUNTER — Telehealth: Payer: Self-pay | Admitting: General Surgery

## 2020-09-03 ENCOUNTER — Other Ambulatory Visit: Payer: Self-pay

## 2020-09-03 ENCOUNTER — Encounter: Payer: Self-pay | Admitting: Physician Assistant

## 2020-09-03 VITALS — BP 131/85 | HR 92 | Temp 98.3°F | Ht 76.0 in | Wt 253.0 lb

## 2020-09-03 DIAGNOSIS — K810 Acute cholecystitis: Secondary | ICD-10-CM | POA: Diagnosis not present

## 2020-09-03 MED ORDER — OXYCODONE HCL 5 MG PO TABS: 5.0000 mg | ORAL_TABLET | Freq: Four times a day (QID) | ORAL | 0 refills | Status: DC | PRN

## 2020-09-03 NOTE — Patient Instructions (Addendum)
Stop doing the flushes. We will send you in some pain medication to [facility] in [place] may also take Ibuprofen or Tylenol as needed.   Continue to empty the bag and monitor. Let us know if you start to have increased abdominal pain, nausea, or vomiting.

## 2020-09-03 NOTE — Telephone Encounter (Signed)
Incoming call from the patient.  He is have lap chole done with Dr. Celine Ahr 09/08/20.  Has bowel drainage tube in place, is doing some back flushing with saline solution but now is having some leakage for about 2 to 3 days now.  He is having some redness and looks inflamed in that area with some pain.  He has been alternating between Tylenol and Ibuprofen, but this is still not very effective.  Is wondering if he could possibly get something else for the pain and discomfort, wondering if he can get some Tramadol.  Please call the patient to advise.  Thank you.

## 2020-09-03 NOTE — Progress Notes (Signed)
[facility] NOTE  09/03/2020  History of Present Illness: Richard Oconnell is a 65 y.o. male known to our service following an admission in February of 2022 for acute cholecystitis s/p percutaneous cholecystostomy tube placement. He was seen in follow up and cholangiogram continued to show obstruction of his cystic duct. He has plans for robotic assisted laparoscopic cholecystectomy with Dr Celine Ahr on 04/25. He presents tot he clinic today with concerns over his drain. He reports in the last 48 hours or so, this has become more painful with certain movements and he notices some leaking around the tube with flushing. He has ben taking ibuprofen 400 mg once daily for this, which helps temporarily. The drain has been putting 10-20 ccs daily of bilious appearing fluid, but his bag and tubing are empty at my time of evaluation. He denied any associated nausea, emesis, fever, chills. He reports that he just "wants to make sure nothing is wrong, and get through to surgery on Monday." No other complaints. No changes in history since previous visit. He has received clearance from Dr Parks Ranger, DO.    Past Medical History: Past Medical History:  Diagnosis Date  . Cancer [facility])    Bladder Cancer  . Erectile dysfunction associated with type 2 diabetes mellitus ([place]) 05/26/2016  . Gross hematuria 11/16/2016  . Hypertension 06/11/2015  . Type 2 diabetes mellitus with diabetic polyneuropathy, without long-term current use of insulin ([place]) 06/11/2015     Past Surgical History: Past Surgical History:  Procedure Laterality Date  . boil lanceted     hand  . COLONOSCOPY WITH PROPOFOL N/A 07/03/2020   Procedure: COLONOSCOPY WITH PROPOFOL;  Surgeon: Jonathon Bellows, MD;  Location: [facility] ENDOSCOPY;  Service: Gastroenterology;  Laterality: N/A;  . CYSTOSCOPY W/ RETROGRADES Bilateral 12/06/2016   Procedure: CYSTOSCOPY WITH RETROGRADE PYELOGRAM;  Surgeon: Hollice Espy, MD;  Location:  [facility] ORS;  Service: Urology;  Laterality: Bilateral;  . CYSTOSCOPY W/ RETROGRADES Bilateral 06/27/2017   Procedure: CYSTOSCOPY WITH RETROGRADE PYELOGRAM;  Surgeon: Hollice Espy, MD;  Location: [facility] ORS;  Service: Urology;  Laterality: Bilateral;  . CYSTOSCOPY WITH BIOPSY N/A 06/27/2017   Procedure: CYSTOSCOPY WITH Bladder BIOPSY;  Surgeon: Hollice Espy, MD;  Location: [facility] ORS;  Service: Urology;  Laterality: N/A;  . CYSTOSCOPY WITH BIOPSY N/A 10/24/2017   Procedure: CYSTOSCOPY WITH Bladder BIOPSY;  Surgeon: Hollice Espy, MD;  Location: [facility] ORS;  Service: Urology;  Laterality: N/A;  . ingrown  Bilateral    ingrown toenail  . IR BILIARY DRAIN PLACEMENT WITH CHOLANGIOGRAM  07/10/2020  . IR CHOLANGIOGRAM EXISTING TUBE  08/07/2020  . IR RADIOLOGIST EVAL & MGMT  08/07/2020  . TRANSURETHRAL RESECTION OF BLADDER TUMOR N/A 12/06/2016   Procedure: TRANSURETHRAL RESECTION OF BLADDER TUMOR (TURBT) (2-5cm) CLOT EVACUATION;  Surgeon: Hollice Espy, MD;  Location: [facility] ORS;  Service: Urology;  Laterality: N/A;  . TRANSURETHRAL RESECTION OF BLADDER TUMOR N/A 06/27/2017   Procedure: TRANSURETHRAL RESECTION OF BLADDER TUMOR (TURBT);  Surgeon: Hollice Espy, MD;  Location: [facility] ORS;  Service: Urology;  Laterality: N/A;  . TRANSURETHRAL RESECTION OF BLADDER TUMOR WITH MITOMYCIN-C N/A 01/18/2017   Procedure: TRANSURETHRAL RESECTION OF BLADDER TUMOR WITH MITOMYCIN-C-(SMALL);  Surgeon: Hollice Espy, MD;  Location: [facility] ORS;  Service: Urology;  Laterality: N/A;    Home Medications: Prior to Admission medications   Medication Sig Start Date End Date Taking? Authorizing Provider  acetaminophen (TYLENOL) 325 MG tablet Take 1 tablet (325 mg total) by mouth every 6 (six) hours as needed for  mild pain, fever or headache (or Fever >/= 101). 07/12/20  Yes Cherene Altes, MD  metoprolol tartrate (LOPRESSOR) 25 MG tablet Take 0.5 tablets (12.5 mg total) by mouth 2 (two) times daily. 07/12/20  Yes Cherene Altes,  MD  OZEMPIC, 0.25 OR 0.5 MG/DOSE, 2 MG/1.5ML SOPN Inject 1 mg into the skin once a week. Patient taking differently: Inject 1 mg into the skin every Thursday. 05/30/20  Yes Karamalegos, Devonne Doughty, DO  pravastatin (PRAVACHOL) 20 MG tablet Take 1 tablet (20 mg total) by mouth daily. 05/30/20  Yes Karamalegos, Devonne Doughty, DO  zolpidem (AMBIEN) 5 MG tablet Take 1 tablet (5 mg total) by mouth at bedtime as needed for sleep. 08/19/20  Yes [name], DO    Allergies: Allergies  Allergen Reactions  . Cyclobenzaprine Other (See Comments)    Not improving pain and makes him agressive.  . Lisinopril Cough    Review of Systems: Review of Systems  Constitutional: Negative for chills and fever.  Respiratory: Negative for cough and shortness of breath.   Cardiovascular: Negative for chest pain and palpitations.  Gastrointestinal: Positive for abdominal pain (Cholecystostomy Tube site). Negative for constipation, diarrhea, nausea and vomiting.  Genitourinary: Negative for dysuria and urgency.  All other systems reviewed and are negative.   Physical Exam BP 131/85   Pulse 92   Temp 98.3 F (36.8 C)   Ht 6\' 4"  (1.93 m)   Wt 253 lb (114.8 kg)   SpO2 96%   BMI 30.80 kg/m   Physical Exam Vitals reviewed.  Constitutional:      General: He is not in acute distress.    Appearance: Normal appearance. He is obese. He is not ill-appearing.  HENT:     Head: Normocephalic and atraumatic.  Eyes:     General: No scleral icterus.    Conjunctiva/sclera: Conjunctivae normal.  Pulmonary:     Effort: Pulmonary effort is normal. No respiratory distress.  Abdominal:     General: Abdomen is protuberant. There is no distension.     Palpations: Abdomen is soft.     Tenderness: There is abdominal tenderness in the right upper quadrant. There is no guarding or rebound.     Comments: Abdomen is protuberant consistent with degree of obesity, he is sore in RUQ over cholecystostomy tube site, no  rebound/guarding. Cholecystostomy tube is secured well in RUQ, there is an expected degree of erythema present. When flushed there is leakage around the tube. No purulence appreciable. Interestingly, it does appear that the suture holding the pigtail mechanism in place was cut  Genitourinary:    Comments: Deferred Musculoskeletal:     Right lower leg: No edema.     Left lower leg: No edema.  Skin:    General: Skin is warm and dry.     Coloration: Skin is not pale.     Findings: No erythema.  Neurological:     General: No focal deficit present.     Mental Status: He is alert and oriented to person, place, and time.  Psychiatric:        Mood and Affect: Mood normal.        Behavior: Behavior normal.     Labs/Imaging: No new pertinent imaging studies  Assessment and Plan: This is a 65 y.o. male with acute cholecystitis s/p percutaneous cholecystostomy tube placement with plans for operative intervention on 04/25   - His percutaneous cholecystotomy tube appears to be functioning well with slight leaking with flushing,. There are  no signs of infection currently. I do not believe the cholecystostomy tube has migrated as the reported drainage is still bilious. I did discuss potentially preforming tube check with radiology; however, I am not sure this adds much to his treatment plan given how close he is to surgery. After long discussion, he seemed more interested in getting additional pain control to get him through to surgery date. I do not think this is an unreasonable request. I will send him 5mg  oxycodone q6 PRN x10 to be used for nighttime/breakthrough pain, which he seemed understanding of. He will continue tylenol / Motrin as well for pain. I did redo his dressing with a bulky waterproof dressing to minimize drainage. He will continue to proceed as scheduled with surgery on Monday 04/25 with Dr Celine Ahr. All questions were addressed and answered.   Face-to-face time spent with the patient  and care providers was 20 minutes, with more than 50% of the time spent counseling, educating, and coordinating care of the patient.     Edison Simon, PA-C [facility] 09/03/2020, 2:27 PM [phone] M-F: 7am - 4pm

## 2020-09-04 ENCOUNTER — Encounter: Payer: Self-pay | Admitting: Physician Assistant

## 2020-09-04 ENCOUNTER — Ambulatory Visit: Payer: Self-pay | Admitting: General Surgery

## 2020-09-04 ENCOUNTER — Other Ambulatory Visit: Payer: Medicare Other

## 2020-09-08 ENCOUNTER — Encounter: Payer: Self-pay | Admitting: General Surgery

## 2020-09-08 ENCOUNTER — Ambulatory Visit: Payer: Medicare Other | Admitting: Anesthesiology

## 2020-09-08 ENCOUNTER — Observation Stay
Admission: RE | Admit: 2020-09-08 | Discharge: 2020-09-09 | Disposition: A | Discharge status: Home / Self Care | Payer: Medicare Other | Attending: General Surgery | Admitting: General Surgery

## 2020-09-08 ENCOUNTER — Encounter
Admission: RE | Disposition: A | Discharge status: Home / Self Care | Payer: Self-pay | Source: Home / Self Care | Attending: General Surgery

## 2020-09-08 ENCOUNTER — Other Ambulatory Visit: Payer: Self-pay

## 2020-09-08 DIAGNOSIS — Z79899 Other long term (current) drug therapy: Secondary | ICD-10-CM | POA: Diagnosis not present

## 2020-09-08 DIAGNOSIS — K660 Peritoneal adhesions (postprocedural) (postinfection): Secondary | ICD-10-CM | POA: Diagnosis not present

## 2020-09-08 DIAGNOSIS — K8018 Calculus of gallbladder with other cholecystitis without obstruction: Secondary | ICD-10-CM | POA: Diagnosis not present

## 2020-09-08 DIAGNOSIS — K8012 Calculus of gallbladder with acute and chronic cholecystitis without obstruction: Principal | ICD-10-CM | POA: Insufficient documentation

## 2020-09-08 DIAGNOSIS — I10 Essential (primary) hypertension: Secondary | ICD-10-CM | POA: Insufficient documentation

## 2020-09-08 DIAGNOSIS — E785 Hyperlipidemia, unspecified: Secondary | ICD-10-CM | POA: Diagnosis not present

## 2020-09-08 DIAGNOSIS — Z8551 Personal history of malignant neoplasm of bladder: Secondary | ICD-10-CM | POA: Diagnosis not present

## 2020-09-08 DIAGNOSIS — K810 Acute cholecystitis: Secondary | ICD-10-CM | POA: Diagnosis present

## 2020-09-08 DIAGNOSIS — K8000 Calculus of gallbladder with acute cholecystitis without obstruction: Secondary | ICD-10-CM | POA: Diagnosis not present

## 2020-09-08 DIAGNOSIS — E1142 Type 2 diabetes mellitus with diabetic polyneuropathy: Secondary | ICD-10-CM | POA: Diagnosis not present

## 2020-09-08 DIAGNOSIS — E119 Type 2 diabetes mellitus without complications: Secondary | ICD-10-CM | POA: Insufficient documentation

## 2020-09-08 DIAGNOSIS — K819 Cholecystitis, unspecified: Secondary | ICD-10-CM | POA: Diagnosis not present

## 2020-09-08 DIAGNOSIS — Z9049 Acquired absence of other specified parts of digestive tract: Secondary | ICD-10-CM

## 2020-09-08 LAB — GLUCOSE, CAPILLARY
Glucose-Capillary: 105 mg/dL — ABNORMAL HIGH (ref 70–99)
Glucose-Capillary: 164 mg/dL — ABNORMAL HIGH (ref 70–99)

## 2020-09-08 SURGERY — CHOLECYSTECTOMY, ROBOT-ASSISTED, LAPAROSCOPIC
Anesthesia: General

## 2020-09-08 MED ORDER — OXYCODONE HCL 5 MG PO TABS
5.0000 mg | ORAL_TABLET | ORAL | Status: DC | PRN
Start: 2020-09-08 — End: 2020-09-09
  Administered 2020-09-08: 10 mg via ORAL
  Filled 2020-09-08: qty 2

## 2020-09-08 MED ORDER — GABAPENTIN 300 MG PO CAPS
ORAL_CAPSULE | ORAL | Status: AC
  Filled 2020-09-08: qty 1

## 2020-09-08 MED ORDER — CHLORHEXIDINE GLUCONATE CLOTH 2 % EX PADS: 6.0000 | MEDICATED_PAD | Freq: Once | CUTANEOUS | Status: DC

## 2020-09-08 MED ORDER — SEVOFLURANE IN SOLN
RESPIRATORY_TRACT | Status: AC
  Filled 2020-09-08: qty 250

## 2020-09-08 MED ORDER — ONDANSETRON HCL 4 MG/2ML IJ SOLN: 4.0000 mg | Freq: Four times a day (QID) | INTRAMUSCULAR | Status: DC | PRN

## 2020-09-08 MED ORDER — FAMOTIDINE 20 MG PO TABS
20.0000 mg | ORAL_TABLET | Freq: Once | ORAL | Status: AC
  Administered 2020-09-08: 20 mg via ORAL

## 2020-09-08 MED ORDER — LIDOCAINE HCL (CARDIAC) PF 100 MG/5ML IV SOSY
PREFILLED_SYRINGE | INTRAVENOUS | Status: DC | PRN
  Administered 2020-09-08: 100 mg via INTRAVENOUS

## 2020-09-08 MED ORDER — ROCURONIUM BROMIDE 100 MG/10ML IV SOLN
INTRAVENOUS | Status: DC | PRN
  Administered 2020-09-08: 50 mg via INTRAVENOUS
  Administered 2020-09-08: 20 mg via INTRAVENOUS
  Administered 2020-09-08: 10 mg via INTRAVENOUS
  Administered 2020-09-08: 30 mg via INTRAVENOUS
  Administered 2020-09-08 (×6): 20 mg via INTRAVENOUS

## 2020-09-08 MED ORDER — PHENYLEPHRINE HCL-NACL 20-0.9 MG/250ML-% IV SOLN
INTRAVENOUS | Status: DC | PRN
  Administered 2020-09-08: 30 ug/min via INTRAVENOUS

## 2020-09-08 MED ORDER — ONDANSETRON HCL 4 MG/2ML IJ SOLN: 4.0000 mg | Freq: Once | INTRAMUSCULAR | Status: DC | PRN

## 2020-09-08 MED ORDER — DIPHENHYDRAMINE-APAP (SLEEP) 25-500 MG PO TABS: 1.0000 | ORAL_TABLET | Freq: Every evening | ORAL | Status: DC | PRN

## 2020-09-08 MED ORDER — ACETAMINOPHEN 10 MG/ML IV SOLN
INTRAVENOUS | Status: DC | PRN
  Administered 2020-09-08: 15 mg via INTRAVENOUS

## 2020-09-08 MED ORDER — FENTANYL CITRATE (PF) 100 MCG/2ML IJ SOLN: 25.0000 ug | INTRAMUSCULAR | Status: DC | PRN

## 2020-09-08 MED ORDER — VISTASEAL 10 ML SINGLE DOSE KIT
PACK | CUTANEOUS | Status: AC
  Filled 2020-09-08: qty 10

## 2020-09-08 MED ORDER — FENTANYL CITRATE (PF) 100 MCG/2ML IJ SOLN
INTRAMUSCULAR | Status: DC | PRN
  Administered 2020-09-08: 25 ug via INTRAVENOUS
  Administered 2020-09-08 (×2): 50 ug via INTRAVENOUS
  Administered 2020-09-08: 25 ug via INTRAVENOUS
  Administered 2020-09-08 (×3): 50 ug via INTRAVENOUS

## 2020-09-08 MED ORDER — SODIUM CHLORIDE 0.9 % IV SOLN
INTRAVENOUS | Status: AC
  Filled 2020-09-08: qty 2

## 2020-09-08 MED ORDER — FENTANYL CITRATE (PF) 100 MCG/2ML IJ SOLN
INTRAMUSCULAR | Status: AC
  Filled 2020-09-08: qty 2

## 2020-09-08 MED ORDER — CHLORHEXIDINE GLUCONATE 0.12 % MT SOLN
OROMUCOSAL | Status: AC
  Administered 2020-09-08: 15 mL via OROMUCOSAL
  Filled 2020-09-08: qty 15

## 2020-09-08 MED ORDER — EPHEDRINE SULFATE 50 MG/ML IJ SOLN
INTRAMUSCULAR | Status: DC | PRN
  Administered 2020-09-08: 5 mg via INTRAVENOUS

## 2020-09-08 MED ORDER — DEXAMETHASONE SODIUM PHOSPHATE 10 MG/ML IJ SOLN
INTRAMUSCULAR | Status: AC
  Filled 2020-09-08: qty 1

## 2020-09-08 MED ORDER — DEXAMETHASONE SODIUM PHOSPHATE 10 MG/ML IJ SOLN
INTRAMUSCULAR | Status: DC | PRN
  Administered 2020-09-08: 10 mg via INTRAVENOUS

## 2020-09-08 MED ORDER — LIDOCAINE HCL (PF) 2 % IJ SOLN
INTRAMUSCULAR | Status: AC
  Filled 2020-09-08: qty 5

## 2020-09-08 MED ORDER — PHENYLEPHRINE HCL (PRESSORS) 10 MG/ML IV SOLN
INTRAVENOUS | Status: DC | PRN
  Administered 2020-09-08: 100 ug via INTRAVENOUS
  Administered 2020-09-08: 200 ug via INTRAVENOUS
  Administered 2020-09-08 (×8): 100 ug via INTRAVENOUS

## 2020-09-08 MED ORDER — SIMETHICONE 80 MG PO CHEW
40.0000 mg | CHEWABLE_TABLET | Freq: Four times a day (QID) | ORAL | Status: DC | PRN
  Filled 2020-09-08: qty 1

## 2020-09-08 MED ORDER — SODIUM CHLORIDE FLUSH 0.9 % IV SOLN
INTRAVENOUS | Status: AC
  Filled 2020-09-08: qty 10

## 2020-09-08 MED ORDER — MIDAZOLAM HCL 2 MG/2ML IJ SOLN
INTRAMUSCULAR | Status: AC
  Filled 2020-09-08: qty 2

## 2020-09-08 MED ORDER — ACETAMINOPHEN 500 MG PO TABS: 500.0000 mg | ORAL_TABLET | Freq: Every evening | ORAL | Status: DC | PRN

## 2020-09-08 MED ORDER — ORAL CARE MOUTH RINSE: 15.0000 mL | Freq: Once | OROMUCOSAL | Status: AC

## 2020-09-08 MED ORDER — ACETAMINOPHEN 500 MG PO TABS
1000.0000 mg | ORAL_TABLET | Freq: Four times a day (QID) | ORAL | Status: DC
  Administered 2020-09-09 (×2): 1000 mg via ORAL
  Filled 2020-09-08 (×2): qty 2

## 2020-09-08 MED ORDER — INDOCYANINE GREEN 25 MG IV SOLR
7.5000 mg | Freq: Once | INTRAVENOUS | Status: AC
  Administered 2020-09-08: 7.5 mg via INTRAVENOUS
  Filled 2020-09-08: qty 3

## 2020-09-08 MED ORDER — TRAMADOL HCL 50 MG PO TABS: 50.0000 mg | ORAL_TABLET | Freq: Four times a day (QID) | ORAL | Status: DC | PRN

## 2020-09-08 MED ORDER — LACTATED RINGERS IV SOLN: INTRAVENOUS | Status: DC

## 2020-09-08 MED ORDER — BUPIVACAINE HCL (PF) 0.25 % IJ SOLN
INTRAMUSCULAR | Status: AC
  Filled 2020-09-08: qty 30

## 2020-09-08 MED ORDER — SODIUM CHLORIDE 0.9 % IV SOLN
2.0000 g | Freq: Two times a day (BID) | INTRAVENOUS | Status: DC
  Administered 2020-09-08 – 2020-09-09 (×2): 2 g via INTRAVENOUS
  Filled 2020-09-08 (×3): qty 2

## 2020-09-08 MED ORDER — DIPHENHYDRAMINE HCL 25 MG PO CAPS: 25.0000 mg | ORAL_CAPSULE | Freq: Every evening | ORAL | Status: DC | PRN

## 2020-09-08 MED ORDER — MIDAZOLAM HCL 2 MG/2ML IJ SOLN
INTRAMUSCULAR | Status: DC | PRN
  Administered 2020-09-08: 2 mg via INTRAVENOUS

## 2020-09-08 MED ORDER — GABAPENTIN 300 MG PO CAPS
300.0000 mg | ORAL_CAPSULE | ORAL | Status: AC
  Administered 2020-09-08: 300 mg via ORAL

## 2020-09-08 MED ORDER — PROPOFOL 10 MG/ML IV BOLUS
INTRAVENOUS | Status: AC
  Filled 2020-09-08: qty 20

## 2020-09-08 MED ORDER — ACETAMINOPHEN 10 MG/ML IV SOLN
INTRAVENOUS | Status: AC
  Filled 2020-09-08: qty 100

## 2020-09-08 MED ORDER — SUGAMMADEX SODIUM 500 MG/5ML IV SOLN
INTRAVENOUS | Status: DC | PRN
  Administered 2020-09-08: 300 mg via INTRAVENOUS

## 2020-09-08 MED ORDER — SODIUM CHLORIDE 0.9 % IV SOLN: INTRAVENOUS | Status: DC

## 2020-09-08 MED ORDER — ACETAMINOPHEN 500 MG PO TABS
1000.0000 mg | ORAL_TABLET | ORAL | Status: AC
  Administered 2020-09-08: 1000 mg via ORAL

## 2020-09-08 MED ORDER — ONDANSETRON 4 MG PO TBDP
4.0000 mg | ORAL_TABLET | Freq: Four times a day (QID) | ORAL | Status: DC | PRN
  Filled 2020-09-08: qty 1

## 2020-09-08 MED ORDER — LIDOCAINE-EPINEPHRINE 1 %-1:100000 IJ SOLN
INTRAMUSCULAR | Status: AC
  Filled 2020-09-08: qty 1

## 2020-09-08 MED ORDER — SODIUM CHLORIDE 0.9 % IV SOLN: 2.0000 g | Freq: Once | INTRAVENOUS | Status: DC

## 2020-09-08 MED ORDER — PROPOFOL 10 MG/ML IV BOLUS
INTRAVENOUS | Status: DC | PRN
  Administered 2020-09-08: 200 mg via INTRAVENOUS

## 2020-09-08 MED ORDER — CELECOXIB 200 MG PO CAPS
200.0000 mg | ORAL_CAPSULE | Freq: Two times a day (BID) | ORAL | Status: DC
  Administered 2020-09-08 – 2020-09-09 (×2): 200 mg via ORAL
  Filled 2020-09-08 (×2): qty 1

## 2020-09-08 MED ORDER — SODIUM CHLORIDE 0.9 % IV SOLN
2.0000 g | INTRAVENOUS | Status: AC
  Administered 2020-09-08 (×2): 2 g via INTRAVENOUS

## 2020-09-08 MED ORDER — HYDROMORPHONE HCL 1 MG/ML IJ SOLN: 0.5000 mg | INTRAMUSCULAR | Status: DC | PRN

## 2020-09-08 MED ORDER — KETOROLAC TROMETHAMINE 15 MG/ML IJ SOLN: 15.0000 mg | Freq: Four times a day (QID) | INTRAMUSCULAR | Status: DC | PRN

## 2020-09-08 MED ORDER — GABAPENTIN 300 MG PO CAPS
300.0000 mg | ORAL_CAPSULE | Freq: Two times a day (BID) | ORAL | Status: DC
  Administered 2020-09-08 – 2020-09-09 (×2): 300 mg via ORAL
  Filled 2020-09-08 (×2): qty 1

## 2020-09-08 MED ORDER — VISTASEAL 10 ML SINGLE DOSE KIT
PACK | CUTANEOUS | Status: DC | PRN
  Administered 2020-09-08: 10 mL via TOPICAL

## 2020-09-08 MED ORDER — ROCURONIUM BROMIDE 10 MG/ML (PF) SYRINGE
PREFILLED_SYRINGE | INTRAVENOUS | Status: AC
  Filled 2020-09-08: qty 10

## 2020-09-08 MED ORDER — ONDANSETRON HCL 4 MG/2ML IJ SOLN
INTRAMUSCULAR | Status: AC
  Filled 2020-09-08: qty 2

## 2020-09-08 MED ORDER — CHLORHEXIDINE GLUCONATE 0.12 % MT SOLN: 15.0000 mL | Freq: Once | OROMUCOSAL | Status: AC

## 2020-09-08 MED ORDER — LIDOCAINE-EPINEPHRINE 1 %-1:100000 IJ SOLN
INTRAMUSCULAR | Status: DC | PRN
  Administered 2020-09-08: 30 mL via INTRAMUSCULAR
  Administered 2020-09-08: 20 mL via INTRAMUSCULAR

## 2020-09-08 MED ORDER — ACETAMINOPHEN 500 MG PO TABS
ORAL_TABLET | ORAL | Status: AC
  Filled 2020-09-08: qty 2

## 2020-09-08 MED ORDER — FAMOTIDINE 20 MG PO TABS
ORAL_TABLET | ORAL | Status: AC
  Filled 2020-09-08: qty 1

## 2020-09-08 MED ORDER — PRAVASTATIN SODIUM 20 MG PO TABS
20.0000 mg | ORAL_TABLET | Freq: Every day | ORAL | Status: DC
  Administered 2020-09-08 – 2020-09-09 (×2): 20 mg via ORAL
  Filled 2020-09-08 (×2): qty 1

## 2020-09-08 MED ORDER — METOPROLOL TARTRATE 25 MG PO TABS
12.5000 mg | ORAL_TABLET | Freq: Two times a day (BID) | ORAL | Status: DC
  Administered 2020-09-08: 12.5 mg via ORAL
  Filled 2020-09-08 (×2): qty 1

## 2020-09-08 SURGICAL SUPPLY — 66 items
ADH SKN CLS APL DERMABOND .7 (GAUZE/BANDAGES/DRESSINGS) ×1
APL PRP STRL LF DISP 70% ISPRP (MISCELLANEOUS) ×1
BAG INFUSER PRESSURE 100CC (MISCELLANEOUS) ×2 IMPLANT
BAG SPEC RTRVL LRG 6X4 10 (ENDOMECHANICALS) ×1
BLADE SURG SZ11 CARB STEEL (BLADE) ×2 IMPLANT
BULB RESERV EVAC DRAIN JP 100C (MISCELLANEOUS) ×2 IMPLANT
CANISTER SUCT 1200ML W/VALVE (MISCELLANEOUS) IMPLANT
CANNULA REDUC XI 12-8 STAPL (CANNULA) ×1
CANNULA REDUCER 12-8 DVNC XI (CANNULA) ×1 IMPLANT
CHLORAPREP W/TINT 26 (MISCELLANEOUS) ×2 IMPLANT
CLIP VESOLOCK LG 6/CT PURPLE (CLIP) ×6 IMPLANT
CLIP VESOLOCK MED LG 6/CT (CLIP) ×2 IMPLANT
COVER TIP SHEARS 8 DVNC (MISCELLANEOUS) ×1 IMPLANT
COVER TIP SHEARS 8MM DA VINCI (MISCELLANEOUS) ×1
COVER WAND RF STERILE (DRAPES) ×2 IMPLANT
DECANTER SPIKE VIAL GLASS SM (MISCELLANEOUS) ×4 IMPLANT
DEFOGGER SCOPE WARMER CLEARIFY (MISCELLANEOUS) ×2 IMPLANT
DERMABOND ADVANCED (GAUZE/BANDAGES/DRESSINGS) ×1
DERMABOND ADVANCED .7 DNX12 (GAUZE/BANDAGES/DRESSINGS) ×1 IMPLANT
DRAIN CHANNEL JP 19F (MISCELLANEOUS) ×2 IMPLANT
DRAPE ARM DVNC X/XI (DISPOSABLE) ×4 IMPLANT
DRAPE COLUMN DVNC XI (DISPOSABLE) ×1 IMPLANT
DRAPE DA VINCI XI ARM (DISPOSABLE) ×4
DRAPE DA VINCI XI COLUMN (DISPOSABLE) ×1
ELECT CAUTERY BLADE TIP 2.5 (TIP) ×2
ELECT REM PT RETURN 9FT ADLT (ELECTROSURGICAL) ×2
ELECTRODE CAUTERY BLDE TIP 2.5 (TIP) ×1 IMPLANT
ELECTRODE REM PT RTRN 9FT ADLT (ELECTROSURGICAL) ×1 IMPLANT
GLOVE SURG ENC MOIS LTX SZ6.5 (GLOVE) ×20 IMPLANT
GLOVE SURG UNDER LTX SZ7 (GLOVE) ×4 IMPLANT
GOWN STRL REUS W/ TWL LRG LVL3 (GOWN DISPOSABLE) ×6 IMPLANT
GOWN STRL REUS W/TWL LRG LVL3 (GOWN DISPOSABLE) ×12
GRASPER SUT TROCAR 14GX15 (MISCELLANEOUS) IMPLANT
HEMOSTAT SURGICEL 2X14 (HEMOSTASIS) ×2 IMPLANT
IRRIGATOR SUCT 8 DISP DVNC XI (IRRIGATION / IRRIGATOR) ×1 IMPLANT
IRRIGATOR SUCTION 8MM XI DISP (IRRIGATION / IRRIGATOR) ×1
IV NS 1000ML (IV SOLUTION) ×2
IV NS 1000ML BAXH (IV SOLUTION) ×1 IMPLANT
KIT PINK PAD W/HEAD ARE REST (MISCELLANEOUS) ×2
KIT PINK PAD W/HEAD ARM REST (MISCELLANEOUS) ×1 IMPLANT
LABEL OR SOLS (LABEL) ×2 IMPLANT
MANIFOLD NEPTUNE II (INSTRUMENTS) ×2 IMPLANT
NEEDLE HYPO 22GX1.5 SAFETY (NEEDLE) ×2 IMPLANT
NEEDLE INSUFFLATION 14GA 120MM (NEEDLE) ×2 IMPLANT
NS IRRIG 500ML POUR BTL (IV SOLUTION) ×2 IMPLANT
OBTURATOR OPTICAL STANDARD 8MM (TROCAR) ×1
OBTURATOR OPTICAL STND 8 DVNC (TROCAR) ×1
OBTURATOR OPTICALSTD 8 DVNC (TROCAR) ×1 IMPLANT
PACK LAP CHOLECYSTECTOMY (MISCELLANEOUS) ×2 IMPLANT
PENCIL ELECTRO HAND CTR (MISCELLANEOUS) ×2 IMPLANT
POUCH SPECIMEN RETRIEVAL 10MM (ENDOMECHANICALS) ×2 IMPLANT
SEAL CANN UNIV 5-8 DVNC XI (MISCELLANEOUS) ×3 IMPLANT
SEAL XI 5MM-8MM UNIVERSAL (MISCELLANEOUS) ×3
SET TUBE SMOKE EVAC HIGH FLOW (TUBING) ×2 IMPLANT
SOLUTION ELECTROLUBE (MISCELLANEOUS) ×2 IMPLANT
SPONGE LAP 4X18 RFD (DISPOSABLE) ×2 IMPLANT
STAPLER CANNULA SEAL DVNC XI (STAPLE) ×1 IMPLANT
STAPLER CANNULA SEAL XI (STAPLE) ×1
STRIP CLOSURE SKIN 1/2X4 (GAUZE/BANDAGES/DRESSINGS) ×2 IMPLANT
SUT MNCRL 4-0 (SUTURE) ×2
SUT MNCRL 4-0 27XMFL (SUTURE) ×1
SUT VIC AB 3-0 SH 27 (SUTURE) ×2
SUT VIC AB 3-0 SH 27X BRD (SUTURE) ×1 IMPLANT
SUT VICRYL 0 AB UR-6 (SUTURE) ×2 IMPLANT
SUTURE MNCRL 4-0 27XMF (SUTURE) ×1 IMPLANT
TROCAR XCEL NON-BLD 5MMX100MML (ENDOMECHANICALS) IMPLANT

## 2020-09-08 NOTE — Anesthesia Postprocedure Evaluation (Signed)
Anesthesia Post Note  [name]  Procedure(s) Performed: XI ROBOTIC ASSISTED LAPAROSCOPIC CHOLECYSTECTOMY (N/A )  Patient location during evaluation: PACU Anesthesia Type: General Level of consciousness: awake and alert Pain management: pain level controlled Vital Signs Assessment: post-procedure vital signs reviewed and stable Respiratory status: spontaneous breathing, nonlabored ventilation, respiratory function stable and patient connected to nasal cannula oxygen Cardiovascular status: blood pressure returned to baseline and stable Postop Assessment: no apparent nausea or vomiting Anesthetic complications: no   No complications documented.   Last Vitals:  Vitals:   09/08/20 1815 09/08/20 1830  BP: 130/90 (!) 122/94  Pulse: 92 94  Resp: 14 13  Temp: 36.8 C   SpO2: 97% 97%    Last Pain:  Vitals:   09/08/20 1815  TempSrc:   PainSc: 0-No pain                 Precious Haws Santiel Topper

## 2020-09-08 NOTE — Anesthesia Procedure Notes (Signed)
Procedure Name: Intubation Date/Time: 09/08/2020 9:32 AM Performed by: Aline Brochure, CRNA Pre-anesthesia Checklist: Patient identified, Emergency Drugs available, Suction available and Patient being monitored Patient Re-evaluated:Patient Re-evaluated prior to induction Oxygen Delivery Method: Circle system utilized Preoxygenation: Pre-oxygenation with 100% oxygen Induction Type: IV induction Ventilation: Mask ventilation without difficulty Laryngoscope Size: McGraph and 4 Grade View: Grade I Tube type: Oral Tube size: 7.5 mm Number of attempts: 1 Airway Equipment and Method: Stylet and Video-laryngoscopy Placement Confirmation: ETT inserted through vocal cords under direct vision,  positive ETCO2 and breath sounds checked- equal and bilateral Secured at: 22 cm Tube secured with: Tape Dental Injury: Teeth and Oropharynx as per pre-operative assessment  Difficulty Due To: Difficulty was anticipated and Difficult Airway- due to anterior larynx

## 2020-09-08 NOTE — Anesthesia Preprocedure Evaluation (Signed)
Anesthesia Evaluation  Patient identified by MRN, date of birth, ID band Patient awake    Reviewed: Allergy & Precautions, NPO status , Patient's Chart, lab work & pertinent test results  History of Anesthesia Complications Negative for: history of anesthetic complications  Airway Mallampati: II       Dental   Pulmonary neg sleep apnea, neg COPD, Not current smoker,           Cardiovascular hypertension, Pt. on medications (-) Past MI and (-) CHF + dysrhythmias (1 episode of afib, resolved) (-) pacemaker(-) Valvular Problems/Murmurs     Neuro/Psych neg Seizures    GI/Hepatic Neg liver ROS, neg GERD  ,  Endo/Other  diabetes, Type 2, Oral Hypoglycemic Agents  Renal/GU negative Renal ROS     Musculoskeletal   Abdominal   Peds  Hematology   Anesthesia Other Findings   Reproductive/Obstetrics                             Anesthesia Physical Anesthesia Plan  ASA: III  Anesthesia Plan: General   Post-op Pain Management:    Induction: Intravenous  PONV Risk Score and Plan: 2 and Ondansetron and Dexamethasone  Airway Management Planned: Oral ETT  Additional Equipment:   Intra-op Plan:   Post-operative Plan:   Informed Consent: I have reviewed the patients History and Physical, chart, labs and discussed the procedure including the risks, benefits and alternatives for the proposed anesthesia with the patient or authorized representative who has indicated his/her understanding and acceptance.       Plan Discussed with:   Anesthesia Plan Comments:         Anesthesia Quick Evaluation

## 2020-09-08 NOTE — Op Note (Signed)
Operative note  Pre-operative Diagnosis: Acute cholecystitis, status post percutaneous cholecystostomy tube placement  Post-operative Diagnosis: Acute on chronic cholecystitis with frank purulent bile; extensive adhesions.  Procedure: Robot assisted laparoscopic cholecystectomy with extensive adhesiolysis  Surgeon: [name], MD  Anesthesia: GETA  Findings: Immediately upon inspection of the abdomen after placing the camera, extensive adhesions of the omentum to the abdominal wall, liver, and essentially the entire right upper quadrant were appreciated.  Once these were cleared, the gallbladder had a thick inflammatory rind surrounding it and was deeply intrahepatic.  The wall of the gallbladder was violated during the operation and frank purulent bile and multiple small stones poured forth.  Ultimately, after a slow and tedious dissection, I was able to obtain a critical view of safety and appreciated normal biliary anatomy, with the assistance of ICG cholangiography.  Estimated Blood Loss: 100 cc       Specimens: Gallbladder           Complications: none immediately apparent  Procedure In Detail: The patient was seen again in the holding room. The benefits, complications, treatment options, and expected outcomes were discussed with the patient. The risks of bleeding, infection, recurrence of symptoms, failure to resolve symptoms, bile duct damage, bile duct leak, retained common bile duct stone, bowel injury, any of which could require further surgery and/or ERCP, stent, or papillotomy were reviewed with the patient. The likelihood of improving the patient's symptoms with return to their baseline status is good.  The patient and/or family concurred with the proposed plan, giving informed consent.  The patient was taken to operating room, identified and the procedure verified as laparoscopic cholecystectomy.  A time out was held and the above information confirmed.  Prior to the  induction of general anesthesia, antibiotic prophylaxis was administered. VTE prophylaxis was in place. General endotracheal anesthesia was then administered and tolerated well. After the induction, the abdomen was prepped with Chloraprep and draped in the sterile fashion. The patient was positioned in the supine position.  Optiview technique was used to enter the abdomen in the right upper quadrant via a standard 5 mm laparoscopic trocar.  Pneumoperitoneum was then created with CO2 and tolerated well without any adverse changes in the patient's vital signs.  A 12 mm robotic trocar along with three 8-mm robotic trochars were placed under direct vision.  All skin incisions were infiltrated with a local anesthetic agent before making the incision and placing the trocars.   The patient was positioned in 15 degrees of reverse Trendelenburg and tilted 10 degrees to the left.  The robot was brought to the surgical field and docked in the standard fashion.  We made sure all the instrumentation was kept in direct view at all times and that there were no collision between the arms.  I scrubbed out and went to the console.  Immediate evaluation of the proposed operative area demonstrated that it was completely socked in with inflamed and adhesed omentum.  Approximately an hour and a half of adhesiolysis ensued, carefully freeing the omentum from the abdominal wall, liver, and gallbladder.  Once the gallbladder was exposed, it was found to be encased in a thick hard rind and was also deeply intrahepatic.  I was unable to grasp it at the fundus, due to how thick and inflamed the wall was, therefore it was held in a retracted position using the Prograsp bipolar.  The next several hours were spent carefully teasing the tissues away from the triangle of [place].  Significant raw  surface oozing resulted.  I ultimately elected to pursue dissection and with a top-down approach, periodically returning to the triangle of [place]  in order to assess our progress.  The wall of the gallbladder was violated on several occasions, as I dissected it out of the liver bed.  Frank pus and innumerable stones poured out of the gallbladder.  These were suctioned away with the robotic suction irrigator.  Eventually, once the gallbladder was nearly completely off of the liver bed, I returned to the triangle of [place].  ICG cholangiography demonstrated an obvious common bile duct, but the cystic duct did not fill.  A tubular structure branched off of the common bile duct, however in lead only to the gallbladder indicating that it was the cystic duct.  This was dissected out bluntly.  It was then doubly clipped on the patient's side and singly clipped on the specimen side and divided.  Continued to dissect through inflamed tissue until I identified the cystic artery.  This was also doubly clipped and divided.  Remaining attachments of the gallbladder to the liver were divided with electrocautery.  The wound bed was irrigated thoroughly and care was taken to suction all of the purulent material and all of the visible stones.  The liver bed was oozing and therefore was treated with a combination of electrocautery, Surgicel, and Vistaseal.  Hemostasis was achieved with the electrocautery. Inspection of the right upper quadrant was performed again. No bleeding, bile duct injury or leak, or bowel injury was noted.  The gallbladder was then placed in an Endopouch bag. The robotic instruments were removed and robotic arms were undocked in the standard fashion.    I scrubbed back in.  Due to the frank infection appreciated as well as the oozing that had occurred during the case, I elected to place a drain.  This was introduced via the 5 mm right upper quadrant port.  It was pulled through the trocar and brought out through the left most robotic trocar site.  The drain was tucked into the liver bed and secured to the skin with 3-0 nylon. The gallbladder was  removed via the 12 mm trocar site.  The remaining 8 mm ports were removed and pneumoperitoneum was released.  Each port site was closed with deep dermal 3-0 Vicryl.  4-0 subcuticular Monocryl was used to close the skin. Dermabond was applied, followed by Steri-Strips.  Due to the length of the case, and in and out catheterization was performed, yielding 900 cc of urine.  The patient was then awakened, extubated, and taken to the postanesthesia recovery unit in stable condition.   Sponge, lap, and needle counts were reported to be correct number at closure and at the conclusion of the case.          [name], MD FACS

## 2020-09-08 NOTE — Interval H&P Note (Signed)
History and Physical Interval Note:  09/08/2020 9:11 AM  Richard Oconnell  has presented today for surgery, with the diagnosis of cholecystitis.  The various methods of treatment have been discussed with the patient and family. After consideration of risks, benefits and other options for treatment, the patient has consented to  Procedure(s): XI ROBOTIC [place] (N/A) as a surgical intervention.  The patient's history has been reviewed, patient examined, no change in status, stable for surgery.  I have reviewed the patient's chart and labs.  Questions were answered to the patient's satisfaction.     [name]

## 2020-09-08 NOTE — Transfer of Care (Signed)
Immediate Anesthesia Transfer of Care Note  [name]  Procedure(s) Performed: XI ROBOTIC ASSISTED LAPAROSCOPIC CHOLECYSTECTOMY (N/A )  Patient Location: PACU  Anesthesia Type:General  Level of Consciousness: awake, alert  and oriented  Airway & Oxygen Therapy: Patient Spontanous Breathing and Patient connected to face mask oxygen  Post-op Assessment: Report given to RN and Post -op Vital signs reviewed and stable  Post vital signs: Reviewed and stable  Last Vitals:  Vitals Value Taken Time  BP 133/94 09/08/20 1700  Temp    Pulse 93 09/08/20 1703  Resp 21 09/08/20 1703  SpO2 94 % 09/08/20 1703  Vitals shown include unvalidated device data.  Last Pain:  Vitals:   09/08/20 0733  TempSrc: Temporal  PainSc: 0-No pain         Complications: No complications documented.

## 2020-09-09 DIAGNOSIS — I10 Essential (primary) hypertension: Secondary | ICD-10-CM | POA: Diagnosis not present

## 2020-09-09 DIAGNOSIS — K8012 Calculus of gallbladder with acute and chronic cholecystitis without obstruction: Secondary | ICD-10-CM | POA: Diagnosis not present

## 2020-09-09 DIAGNOSIS — Z79899 Other long term (current) drug therapy: Secondary | ICD-10-CM | POA: Diagnosis not present

## 2020-09-09 DIAGNOSIS — E119 Type 2 diabetes mellitus without complications: Secondary | ICD-10-CM | POA: Diagnosis not present

## 2020-09-09 DIAGNOSIS — K660 Peritoneal adhesions (postprocedural) (postinfection): Secondary | ICD-10-CM | POA: Diagnosis not present

## 2020-09-09 DIAGNOSIS — Z8551 Personal history of malignant neoplasm of bladder: Secondary | ICD-10-CM | POA: Diagnosis not present

## 2020-09-09 LAB — COMPREHENSIVE METABOLIC PANEL
ALT: 46 U/L — ABNORMAL HIGH (ref 0–44)
AST: 60 U/L — ABNORMAL HIGH (ref 15–41)
Albumin: 3 g/dL — ABNORMAL LOW (ref 3.5–5.0)
Alkaline Phosphatase: 154 U/L — ABNORMAL HIGH (ref 38–126)
Anion gap: 11 (ref 5–15)
BUN: 21 mg/dL (ref 8–23)
CO2: 21 mmol/L — ABNORMAL LOW (ref 22–32)
Calcium: 8.3 mg/dL — ABNORMAL LOW (ref 8.9–10.3)
Chloride: 104 mmol/L (ref 98–111)
Creatinine, Ser: 1.49 mg/dL — ABNORMAL HIGH (ref 0.61–1.24)
GFR, Estimated: 52 mL/min — ABNORMAL LOW (ref >60)
Glucose, Bld: 154 mg/dL — ABNORMAL HIGH (ref 70–99)
Potassium: 4.5 mmol/L (ref 3.5–5.1)
Sodium: 136 mmol/L (ref 135–145)
Total Bilirubin: 0.6 mg/dL (ref 0.3–1.2)
Total Protein: 7.2 g/dL (ref 6.5–8.1)

## 2020-09-09 LAB — MAGNESIUM: Magnesium: 1.9 mg/dL (ref 1.7–2.4)

## 2020-09-09 LAB — CBC
HCT: 38.1 % — ABNORMAL LOW (ref 39.0–52.0)
Hemoglobin: 12.8 g/dL — ABNORMAL LOW (ref 13.0–17.0)
MCH: 28.3 pg (ref 26.0–34.0)
MCHC: 33.6 g/dL (ref 30.0–36.0)
MCV: 84.1 fL (ref 80.0–100.0)
Platelets: 381 10*3/uL (ref 150–400)
RBC: 4.53 MIL/uL (ref 4.22–5.81)
RDW: 13.1 % (ref 11.5–15.5)
WBC: 12.8 10*3/uL — ABNORMAL HIGH (ref 4.0–10.5)
nRBC: 0 % (ref 0.0–0.2)

## 2020-09-09 LAB — PHOSPHORUS: Phosphorus: 2.8 mg/dL (ref 2.5–4.6)

## 2020-09-09 MED ORDER — IBUPROFEN 400 MG PO TABS: 400.0000 mg | ORAL_TABLET | Freq: Four times a day (QID) | ORAL | 0 refills | Status: DC | PRN

## 2020-09-09 MED ORDER — AMOXICILLIN-POT CLAVULANATE 875-125 MG PO TABS: 1.0000 | ORAL_TABLET | Freq: Two times a day (BID) | ORAL | 0 refills | Status: DC

## 2020-09-09 MED ORDER — OXYCODONE HCL 5 MG PO TABS: 5.0000 mg | ORAL_TABLET | Freq: Four times a day (QID) | ORAL | 0 refills | Status: DC | PRN

## 2020-09-09 MED ORDER — SODIUM CHLORIDE 0.9 % IV BOLUS
1000.0000 mL | Freq: Once | INTRAVENOUS | Status: AC
  Administered 2020-09-09: 1000 mL via INTRAVENOUS

## 2020-09-09 NOTE — Discharge Instructions (Signed)
In addition to included general post-operative instructions,  Diet: Resume home heart healthy diet. Recommend avoiding or limiting fatty/greasy foods over the next few days/week. If you do eat these, you may (or may not) notice diarrhea. This is expected while your body adjusts to not having a gallbladder, and it typically resolves with time.   Activity: No heavy lifting >20 pounds (children, pets, laundry, garbage) for 4 weeks, but light activity and walking are encouraged. Do not drive or drink alcohol if taking narcotic pain medications or having pain that might distract from driving.  Wound care: If you can keep drain site waterproofed....2 days after surgery (04/27), you may shower/get incision wet with soapy water and pat dry (do not rub incisions), but no baths or submerging incision underwater until follow-up. You have steri-strips on your incisions. These will typically stay on for 10-14 days and begin to fall off on their own.   Drain Care: Monitor and record drain output daily. Handouts were provided.   Medications: Resume all home medications. For mild to moderate pain: acetaminophen (Tylenol) or ibuprofen/naproxen (if no kidney disease). Combining Tylenol with alcohol can substantially increase your risk of causing liver disease. Narcotic pain medications, if prescribed, can be used for severe pain, though may cause nausea, constipation, and drowsiness. Do not combine Tylenol and Percocet (or similar) within a 6 hour period as Percocet (and similar) contain(s) Tylenol. If you do not need the narcotic pain medication, you do not need to fill the prescription.  Call office [phone] / [phone]) at any time if any questions, worsening pain, fevers/chills, bleeding, drainage from incision site, or other concerns.

## 2020-09-09 NOTE — Progress Notes (Signed)
Pt provided discharge instructions and demonstration of JP drain care provided. Education provided to pt and his sister and was confirmed via teach back method. Pt is ready for discharge and mode of transport is wheelchair, accompanied by sister.   09/09/20 1150  Vitals  Temp 98.6 F (37 C)  Temp Source Oral  BP 108/77  MAP (mmHg) 85  BP Location Right Arm  BP Method Automatic  Patient Position (if appropriate) Sitting  Pulse Rate (!) 104  Pulse Rate Source Monitor  Resp 16  MEWS COLOR  MEWS Score Color Green  Oxygen Therapy  SpO2 96 %  O2 Device Room Air

## 2020-09-09 NOTE — Discharge Summary (Addendum)
[facility] SUR[facility]ICAL DISCHARGE SUMMARY  [name] MRN: [id number] DOB/AGE: 65/05/1955 65 y.o.  Admit date: 09/08/2020 Discharge date: 09/09/2020  Discharge Diagnoses Patient Active Problem List   Diagnosis Date Noted   S/P laparoscopic cholecystectomy 09/08/2020    Consultants None  Procedures 09/08/2020:  Robotic assisted laparoscopic cholecystectomy with extensive adhesiolysis  HPI: Richard Oconnell is a 65 y.o. male with a history of severe cholecystitis s/p percutaneous cholecystostomy tube who presents to [facility] on 04/[facility] for scheduled cholecystectomy with Dr Celine Ahr.   Hospital Course: Informed consent was obtained and documented, and patient underwent challenging, but ultimately uneventful, robotic assisted laparoscopic cholecystectomy with extensive adhesiolysis (Dr Celine Ahr, 09/08/2020).  Post-operatively, patient's did remarkable well. Advancement of patient's diet and ambulation were well-tolerated. The remainder of patient's hospital course was essentially unremarkable, and discharge planning was initiated accordingly with patient safely able to be discharged home with appropriate discharge instructions, antibiotics (Augmentin x7 days), pain control, and outpatient follow-up after all of his (and his sister's at bedside) questions were answered to his expressed satisfaction.   Discharge Condition: Good   Physical Examination:  Constitutional: Well appearing male, NAD Pulmonary: Normal effort, no respiratory distress Gastrointestinal: Soft, non-tender, non-distended, no rebound/guarding. Surgical drain in LLQ with serosanguinous output Skin: Laparoscopic incisions are CDI with steri-strips, no erythema or drainage    Allergies as of 09/09/2020       Reactions   Cyclobenzaprine Other (See Comments)   Not improving pain and makes him agressive.   Lisinopril Cough        Medication List     TAKE these medications     acetaminophen 325 MG tablet Commonly known as: TYLENOL Take 1 tablet (325 mg total) by mouth every 6 (six) hours as needed for mild pain, fever or headache (or Fever >/= 101).   amoxicillin-clavulanate 875-125 MG tablet Commonly known as: Augmentin Take 1 tablet by mouth 2 (two) times daily for 7 days.   diphenhydramine-acetaminophen 25-500 MG Tabs tablet Commonly known as: TYLENOL PM Take 1 tablet by mouth at bedtime as needed.   ibuprofen 400 MG tablet Commonly known as: ADVIL Take 1 tablet (400 mg total) by mouth every 6 (six) hours as needed.   metoprolol tartrate 25 MG tablet Commonly known as: LOPRESSOR Take 0.5 tablets (12.5 mg total) by mouth 2 (two) times daily.   oxyCODONE 5 MG immediate release tablet Commonly known as: Oxy IR/ROXICODONE Take 1 tablet (5 mg total) by mouth every 6 (six) hours as needed for severe pain or breakthrough pain.   Ozempic (0.25 or 0.5 MG/DOSE) 2 MG/1.5ML Sopn Generic drug: Semaglutide(0.25 or 0.5MG /DOS) Inject 1 mg into the skin once a week. What changed: when to take this   pravastatin 20 MG tablet Commonly known as: PRAVACHOL Take 1 tablet (20 mg total) by mouth daily.   zolpidem 5 MG tablet Commonly known as: AMBIEN Take 1 tablet (5 mg total) by mouth at bedtime as needed for sleep.          Follow-up Information     [name], MD. Schedule an appointment as soon as possible for a visit on 09/16/2020.   Specialty: General Surgery Why: s/p lap chole with drain  Contact information: [address] [place] [address] [phone]                  Time spent on discharge management including discussion of hospital course, clinical condition, outpatient instructions, prescriptions, and follow up with the  patient and members of the medical team: >30 minutes  -- Edison Simon , PA-C  [facility]  09/09/2020, 11:22 AM [phone] M-F: 7am - 4pm   The patient had departed  the facility prior to my evaluation.  I did discuss the assessment and plan, as documented above, with Richard Oconnell.  I concur with his documentation.

## 2020-09-10 ENCOUNTER — Telehealth: Payer: Self-pay

## 2020-09-10 ENCOUNTER — Encounter: Payer: Self-pay | Admitting: Emergency Medicine

## 2020-09-10 ENCOUNTER — Other Ambulatory Visit: Payer: Self-pay

## 2020-09-10 ENCOUNTER — Emergency Department
Admission: EM | Admit: 2020-09-10 | Discharge: 2020-09-10 | Disposition: A | Discharge status: Home / Self Care | Payer: Medicare Other | Attending: Emergency Medicine | Admitting: Emergency Medicine

## 2020-09-10 DIAGNOSIS — Z79899 Other long term (current) drug therapy: Secondary | ICD-10-CM | POA: Insufficient documentation

## 2020-09-10 DIAGNOSIS — E119 Type 2 diabetes mellitus without complications: Secondary | ICD-10-CM | POA: Diagnosis not present

## 2020-09-10 DIAGNOSIS — Z4800 Encounter for change or removal of nonsurgical wound dressing: Secondary | ICD-10-CM | POA: Insufficient documentation

## 2020-09-10 DIAGNOSIS — Z8551 Personal history of malignant neoplasm of bladder: Secondary | ICD-10-CM | POA: Insufficient documentation

## 2020-09-10 DIAGNOSIS — K9189 Other postprocedural complications and disorders of digestive system: Secondary | ICD-10-CM | POA: Diagnosis not present

## 2020-09-10 DIAGNOSIS — I10 Essential (primary) hypertension: Secondary | ICD-10-CM | POA: Diagnosis not present

## 2020-09-10 DIAGNOSIS — Z5189 Encounter for other specified aftercare: Secondary | ICD-10-CM

## 2020-09-10 LAB — SURGICAL PATHOLOGY

## 2020-09-10 NOTE — ED Notes (Signed)
Dressing to JP drain saturated, pt reports he cannot change drain at home well. JP drain site cleaned, dressing changed. Visitor in room and pt educated on how best to drain JP drain and change dressing. Pt advised to call PCP for further assistance if pt is not able to have help at home. HTN, pt reports he has not taking his antihypertensives this AM. AOx4. Pt talking in full sentences with regular and unlabored breathing.

## 2020-09-10 NOTE — ED Provider Notes (Signed)
[facility] Emergency Department Provider Note  ____________________________________________   Event Date/Time   First MD Initiated Contact with Patient 09/10/20 [phone]     (approximate)  I have reviewed the triage vital signs and the nursing notes.   HISTORY  Chief Complaint Wound Check   HPI Amedeo Detweiler is a 65 y.o. male presents to the ED due to complications with JP drainage and dressing change.  Patient states he is unable to do this at home by himself.  Patient had surgery on Monday without any complications and went home yesterday.  Patient reports that "I am too fat to see it".  No other complaints are voiced.  Chart review does show that patient had surgery for an acute cholecystitis on 09/08/2020.         Past Medical History:  Diagnosis Date  . Cancer [facility])    Bladder Cancer  . Erectile dysfunction associated with type 2 diabetes mellitus ([place]) 05/26/2016  . Gross hematuria 11/16/2016  . Hypertension 06/11/2015  . Type 2 diabetes mellitus with diabetic polyneuropathy, without long-term current use of insulin ([place]) 06/11/2015    Patient Active Problem List   Diagnosis Date Noted  . S/P laparoscopic cholecystectomy 09/08/2020  . Metabolic syndrome 61/95/0932  . Class 1 obesity due to excess calories with serious comorbidity and body mass index (BMI) of 33.0 to 33.9 in adult 07/06/2019  . Malignant neoplasm of urinary bladder ([place]) 12/06/2018  . Overweight (BMI 25.0-29.9) 07/04/2018  . Hyperlipidemia associated with type 2 diabetes mellitus ([place]) 11/30/2016  . 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 Gross hematuria 11/16/2016  . Erectile dysfunction associated with type 2 diabetes mellitus ([place]) 05/26/2016  . Diabetes mellitus with coincident hypertension ([place]) 06/11/2015  . Type 2 diabetes mellitus with diabetic polyneuropathy, without long-term current use of insulin ([place]) 06/11/2015    Past Surgical History:  Procedure Laterality Date   . boil lanceted     hand  . COLONOSCOPY WITH PROPOFOL N/A 07/03/2020   Procedure: COLONOSCOPY WITH PROPOFOL;  Surgeon: Jonathon Bellows, MD;  Location: [facility] ENDOSCOPY;  Service: Gastroenterology;  Laterality: N/A;  . CYSTOSCOPY W/ RETROGRADES Bilateral 12/06/2016   Procedure: CYSTOSCOPY WITH RETROGRADE PYELOGRAM;  Surgeon: Hollice Espy, MD;  Location: [facility] ORS;  Service: Urology;  Laterality: Bilateral;  . CYSTOSCOPY W/ RETROGRADES Bilateral 06/27/2017   Procedure: CYSTOSCOPY WITH RETROGRADE PYELOGRAM;  Surgeon: Hollice Espy, MD;  Location: [facility] ORS;  Service: Urology;  Laterality: Bilateral;  . CYSTOSCOPY WITH BIOPSY N/A 06/27/2017   Procedure: CYSTOSCOPY WITH Bladder BIOPSY;  Surgeon: Hollice Espy, MD;  Location: [facility] ORS;  Service: Urology;  Laterality: N/A;  . CYSTOSCOPY WITH BIOPSY N/A 10/24/2017   Procedure: CYSTOSCOPY WITH Bladder BIOPSY;  Surgeon: Hollice Espy, MD;  Location: [facility] ORS;  Service: Urology;  Laterality: N/A;  . ingrown  Bilateral    ingrown toenail  . IR BILIARY DRAIN PLACEMENT WITH CHOLANGIOGRAM  07/10/2020  . IR CHOLANGIOGRAM EXISTING TUBE  08/07/2020  . IR RADIOLOGIST EVAL & MGMT  08/07/2020  . TRANSURETHRAL RESECTION OF BLADDER TUMOR N/A 12/06/2016   Procedure: TRANSURETHRAL RESECTION OF BLADDER TUMOR (TURBT) (2-5cm) CLOT EVACUATION;  Surgeon: Hollice Espy, MD;  Location: [facility] ORS;  Service: Urology;  Laterality: N/A;  . TRANSURETHRAL RESECTION OF BLADDER TUMOR N/A 06/27/2017   Procedure: TRANSURETHRAL RESECTION OF BLADDER TUMOR (TURBT);  Surgeon: Hollice Espy, MD;  Location: [facility] ORS;  Service: Urology;  Laterality: N/A;  . TRANSURETHRAL RESECTION OF BLADDER TUMOR WITH MITOMYCIN-C N/A 01/18/2017   Procedure: TRANSURETHRAL RESECTION  OF BLADDER TUMOR WITH MITOMYCIN-C-(SMALL);  Surgeon: Hollice Espy, MD;  Location: [facility] ORS;  Service: Urology;  Laterality: N/A;    Prior to Admission medications   Medication Sig Start Date End Date Taking? Authorizing Provider   acetaminophen (TYLENOL) 325 MG tablet Take 1 tablet (325 mg total) by mouth every 6 (six) hours as needed for mild pain, fever or headache (or Fever >/= 101). 07/12/20   Cherene Altes, MD  amoxicillin-clavulanate (AUGMENTIN) 875-125 MG tablet Take 1 tablet by mouth 2 (two) times daily for 7 days. 09/09/20 09/16/20  Tylene Fantasia, PA-C  diphenhydramine-acetaminophen (TYLENOL PM) 25-500 MG TABS tablet Take 1 tablet by mouth at bedtime as needed.    [provider]  ibuprofen (ADVIL) 400 MG tablet Take 1 tablet (400 mg total) by mouth every 6 (six) hours as needed. 09/09/20   Tylene Fantasia, PA-C  metoprolol tartrate (LOPRESSOR) 25 MG tablet Take 0.5 tablets (12.5 mg total) by mouth 2 (two) times daily. 07/12/20   Cherene Altes, MD  oxyCODONE (OXY IR/ROXICODONE) 5 MG immediate release tablet Take 1 tablet (5 mg total) by mouth every 6 (six) hours as needed for severe pain or breakthrough pain. 09/09/20   Tylene Fantasia, PA-C  OZEMPIC, 0.25 OR 0.5 MG/DOSE, 2 MG/1.5ML SOPN Inject 1 mg into the skin once a week. Patient taking differently: Inject 1 mg into the skin every Thursday. 05/30/20   Karamalegos, Devonne Doughty, DO  pravastatin (PRAVACHOL) 20 MG tablet Take 1 tablet (20 mg total) by mouth daily. 05/30/20   Karamalegos, Devonne Doughty, DO  zolpidem (AMBIEN) 5 MG tablet Take 1 tablet (5 mg total) by mouth at bedtime as needed for sleep. 08/19/20   [name], DO    Allergies Cyclobenzaprine and Lisinopril  Family History  Problem Relation Age of Onset  . Heart attack Mother   . Heart disease Mother   . Parkinson's disease Father   . Heart attack Maternal Grandfather   . Heart disease Maternal Grandfather   . Heart attack Maternal Aunt   . Heart attack Maternal Grandmother   . Prostate cancer Neg Hx   . Bladder Cancer Neg Hx   . Kidney cancer Neg Hx     Social History Social History   Tobacco Use  . Smoking status: Never Smoker  . Smokeless tobacco: Never  Used  . Tobacco comment: once a while had one cigar >10 years ago  Vaping Use  . Vaping Use: Never used  Substance Use Topics  . Alcohol use: No    Alcohol/week: 0.0 standard drinks  . Drug use: No    Review of Systems Constitutional: No fever/chills Eyes: No visual changes. Cardiovascular: Denies chest pain. Respiratory: Denies shortness of breath. Gastrointestinal: No abdominal pain.  No nausea, no vomiting.  No diarrhea.  Genitourinary: Negative for dysuria. Musculoskeletal: Negative for musculoskeletal pain. Skin: Negative for rash. Neurological: Negative for headaches, focal weakness or numbness.  ____________________________________________   PHYSICAL EXAM:  VITAL SIGNS: ED Triage Vitals  Enc Vitals Group     BP      Pulse      Resp      Temp      Temp src      SpO2      Weight      Height      Head Circumference      Peak Flow      Pain Score      Pain Loc  Pain Edu?      Excl. in [place]?     Constitutional: Alert and oriented. Well appearing and in no acute distress. Eyes: Conjunctivae are normal. PERRL. EOMI. Head: Atraumatic. Neck: No stridor.   Cardiovascular: Normal rate, regular rhythm. Grossly normal heart sounds.  Good peripheral circulation. Respiratory: Normal respiratory effort.  No retractions. Lungs CTAB. Gastrointestinal: Soft and nontender. No distention.  Bowel sounds normoactive. Musculoskeletal: No lower extremity tenderness nor edema.  No joint effusions. Neurologic:  Normal speech and language. No gross focal neurologic deficits are appreciated.  Skin:  Skin is warm, dry and intact. No rash noted. Psychiatric: Mood and affect are normal. Speech and behavior are normal.  ____________________________________________   LABS (all labs ordered are listed, but only abnormal results are displayed)  Labs Reviewed - No data to display ____________________________________________  PROCEDURES  Procedure(s) performed (including  Critical Care):  Procedures   ____________________________________________   INITIAL IMPRESSION / ASSESSMENT AND PLAN / ED COURSE  As part of my medical decision making, I reviewed the following data within the electronic MEDICAL RECORD NUMBER Notes from prior ED visits and [place] Controlled Substance Database  65 year old male presents to the ED with concerns of a JP drainage and dressing change.  Patient was seen and surgery for an acute cholecystitis and discharged home yesterday.  He is having difficulty with the drainage.  Nursing staff took care of this and also replaced the dressing.  Patient reports that he did not take his blood pressure medication this morning but is reminded to do so soon as he gets home.  He is afebrile without any nausea or vomiting.  Patient is ambulatory both to the room and at discharge without any difficulty.  He is encouraged to follow-up with his surgeon if any continued problems and also already has a follow-up appointment scheduled.  ____________________________________________   FINAL CLINICAL IMPRESSION(S) / ED DIAGNOSES  Final diagnoses:  Encounter for wound re-check     ED Discharge Orders    None      *Please note:  Richard Oconnell was evaluated in Emergency Department on 09/10/2020 for the symptoms described in the history of present illness. He was evaluated in the context of the global COVID-19 pandemic, which necessitated consideration that the patient might be at risk for infection with the SARS-CoV-2 virus that causes COVID-19. Institutional protocols and algorithms that pertain to the evaluation of patients at risk for COVID-19 are in a state of rapid change based on information released by regulatory bodies including the CDC and federal and state organizations. These policies and algorithms were followed during the patient's care in the ED.  Some ED evaluations and interventions may be delayed as a result of limited staffing during and the  pandemic.*   Note:  This document was prepared using Dragon voice recognition software and may include unintentional dictation errors.    Johnn Hai, PA-C 09/10/20 [id number]    Blake Divine, MD 09/10/20 [phone]

## 2020-09-10 NOTE — ED Triage Notes (Signed)
Pt here for JP drainage and dressing change at JP insertion site. Pt states he isn't able to do it himself at home. Walked to room 41 with no issues.

## 2020-09-10 NOTE — Telephone Encounter (Signed)
Looks like he has already been to ED today for dressing change. It was likely just done through their fast-track program, for a non acute issue like suture removal etc. So looks like it's taken care of already  Nobie Putnam, [facility] 09/10/2020, 10:07 AM

## 2020-09-10 NOTE — Telephone Encounter (Signed)
Pt stated he just missed a call from the office, no message was left, requested a call back.

## 2020-09-10 NOTE — Discharge Instructions (Addendum)
Follow up with Dr. Celine Ahr if any continued problems

## 2020-09-10 NOTE — Telephone Encounter (Signed)
Pt came by the office today requesting his JP drain dressing to be changed and drained. I recommended that the patient contact the surgeon. He said he will just go back to the ER. I advised against going to the ER. I told him I would send you a message and follow back up with him.

## 2020-09-11 ENCOUNTER — Ambulatory Visit (INDEPENDENT_AMBULATORY_CARE_PROVIDER_SITE_OTHER): Payer: Medicare Other | Admitting: Surgery

## 2020-09-11 ENCOUNTER — Other Ambulatory Visit: Payer: Self-pay

## 2020-09-11 DIAGNOSIS — K810 Acute cholecystitis: Secondary | ICD-10-CM | POA: Diagnosis not present

## 2020-09-11 NOTE — Progress Notes (Signed)
Pt came in to have his gallbladder drain, drained. Pt states he has no one to help him at home.   Primarily serous drainage, nearly a full bulb after 36 hrs.  Non-bilious.

## 2020-09-12 ENCOUNTER — Ambulatory Visit (INDEPENDENT_AMBULATORY_CARE_PROVIDER_SITE_OTHER): Payer: Medicare Other

## 2020-09-12 ENCOUNTER — Other Ambulatory Visit: Payer: Self-pay

## 2020-09-12 DIAGNOSIS — K810 Acute cholecystitis: Secondary | ICD-10-CM

## 2020-09-12 NOTE — Patient Instructions (Signed)
Empty your drain daily and record the amount. Follow up on Monday. Call with any questions.

## 2020-09-12 NOTE — Progress Notes (Signed)
Patient came in today for a wound check.  The wound is clean, with no signs of infection noted. Drain emptyed and 20 cc of pale red fluid obtained. dressing around drain changed. Follow up as scheduled.

## 2020-09-14 ENCOUNTER — Encounter: Payer: Self-pay | Admitting: Emergency Medicine

## 2020-09-14 ENCOUNTER — Other Ambulatory Visit: Payer: Self-pay

## 2020-09-14 ENCOUNTER — Emergency Department
Admission: EM | Admit: 2020-09-14 | Discharge: 2020-09-14 | Disposition: A | Discharge status: Home / Self Care | Payer: Medicare Other | Attending: Emergency Medicine | Admitting: Emergency Medicine

## 2020-09-14 DIAGNOSIS — Z4801 Encounter for change or removal of surgical wound dressing: Secondary | ICD-10-CM | POA: Insufficient documentation

## 2020-09-14 DIAGNOSIS — Z8551 Personal history of malignant neoplasm of bladder: Secondary | ICD-10-CM | POA: Insufficient documentation

## 2020-09-14 DIAGNOSIS — Z79899 Other long term (current) drug therapy: Secondary | ICD-10-CM | POA: Insufficient documentation

## 2020-09-14 DIAGNOSIS — Z9049 Acquired absence of other specified parts of digestive tract: Secondary | ICD-10-CM | POA: Diagnosis not present

## 2020-09-14 DIAGNOSIS — I10 Essential (primary) hypertension: Secondary | ICD-10-CM | POA: Diagnosis not present

## 2020-09-14 DIAGNOSIS — E1142 Type 2 diabetes mellitus with diabetic polyneuropathy: Secondary | ICD-10-CM | POA: Insufficient documentation

## 2020-09-14 NOTE — ED Notes (Signed)
Dressing changed with gauze and tegaderm

## 2020-09-14 NOTE — ED Provider Notes (Signed)
[facility] Emergency Department Provider Note   ____________________________________________    I have reviewed the triage vital signs and the nursing notes.   HISTORY  Chief Complaint Dressing Change     HPI Richard Oconnell is a 65 y.o. male with a history as noted below who recently had complicated cholecystectomy with JP drain left lower abdomen.  Patient reports he has been able to change the dressing on his own because is difficult to reach and he does not have the appropriate supplies.  That is why he is here today.  He reports otherwise he is feeling well.  No fevers chills or rash.  Past Medical History:  Diagnosis Date  . Cancer [facility])    Bladder Cancer  . Erectile dysfunction associated with type 2 diabetes mellitus ([place]) 05/26/2016  . Gross hematuria 11/16/2016  . Hypertension 06/11/2015  . Type 2 diabetes mellitus with diabetic polyneuropathy, without long-term current use of insulin ([place]) 06/11/2015    Patient Active Problem List   Diagnosis Date Noted  . S/P laparoscopic cholecystectomy 09/08/2020  . Metabolic syndrome 26/83/4196  . Class 1 obesity due to excess calories with serious comorbidity and body mass index (BMI) of 33.0 to 33.9 in adult 07/06/2019  . Malignant neoplasm of urinary bladder ([place]) 12/06/2018  . Overweight (BMI 25.0-29.9) 07/04/2018  . Hyperlipidemia associated with type 2 diabetes mellitus ([place]) 11/30/2016  . 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 Gross hematuria 11/16/2016  . Erectile dysfunction associated with type 2 diabetes mellitus ([place]) 05/26/2016  . Diabetes mellitus with coincident hypertension ([place]) 06/11/2015  . Type 2 diabetes mellitus with diabetic polyneuropathy, without long-term current use of insulin ([place]) 06/11/2015    Past Surgical History:  Procedure Laterality Date  . boil lanceted     hand  . COLONOSCOPY WITH PROPOFOL N/A 07/03/2020   Procedure: COLONOSCOPY WITH PROPOFOL;  Surgeon:  Jonathon Bellows, MD;  Location[facility] ENDOSCOPY;  Service: Gastroenterology;  Laterality: N/A;  . CYSTOSCOPY W/ RETROGRADES Bilateral 12/06/2016   Procedure: CYSTOSCOPY WITH RETROGRADE PYELOGRAM;  Surgeon: Hollice Espy, MD;  Location: [facility] ORS;  Service: Urology;  Laterality: Bilateral;  . CYSTOSCOPY W/ RETROGRADES Bilateral 06/27/2017   Procedure: CYSTOSCOPY WITH RETROGRADE PYELOGRAM;  Surgeon: Hollice Espy, MD;  Location: [facility] ORS;  Service: Urology;  Laterality: Bilateral;  . CYSTOSCOPY WITH BIOPSY N/A 06/27/2017   Procedure: CYSTOSCOPY WITH Bladder BIOPSY;  Surgeon: Hollice Espy, MD;  Location: [facility] ORS;  Service: Urology;  Laterality: N/A;  . CYSTOSCOPY WITH BIOPSY N/A 10/24/2017   Procedure: CYSTOSCOPY WITH Bladder BIOPSY;  Surgeon: Hollice Espy, MD;  Location: [facility] ORS;  Service: Urology;  Laterality: N/A;  . ingrown  Bilateral    ingrown toenail  . IR BILIARY DRAIN PLACEMENT WITH CHOLANGIOGRAM  07/10/2020  . IR CHOLANGIOGRAM EXISTING TUBE  08/07/2020  . IR RADIOLOGIST EVAL & MGMT  08/07/2020  . TRANSURETHRAL RESECTION OF BLADDER TUMOR N/A 12/06/2016   Procedure: TRANSURETHRAL RESECTION OF BLADDER TUMOR (TURBT) (2-5cm) CLOT EVACUATION;  Surgeon: Hollice Espy, MD;  Location: [facility] ORS;  Service: Urology;  Laterality: N/A;  . TRANSURETHRAL RESECTION OF BLADDER TUMOR N/A 06/27/2017   Procedure: TRANSURETHRAL RESECTION OF BLADDER TUMOR (TURBT);  Surgeon: Hollice Espy, MD;  Location: [facility] ORS;  Service: Urology;  Laterality: N/A;  . TRANSURETHRAL RESECTION OF BLADDER TUMOR WITH MITOMYCIN-C N/A 01/18/2017   Procedure: TRANSURETHRAL RESECTION OF BLADDER TUMOR WITH MITOMYCIN-C-(SMALL);  Surgeon: Hollice Espy, MD;  Location: [facility] ORS;  Service: Urology;  Laterality: N/A;    Prior to  Admission medications   Medication Sig Start Date End Date Taking? Authorizing Provider  acetaminophen (TYLENOL) 325 MG tablet Take 1 tablet (325 mg total) by mouth every 6 (six) hours as needed for mild pain,  fever or headache (or Fever >/= 101). 07/12/20   Cherene Altes, MD  amoxicillin-clavulanate (AUGMENTIN) 875-125 MG tablet Take 1 tablet by mouth 2 (two) times daily for 7 days. 09/09/20 09/16/20  Tylene Fantasia, PA-C  diphenhydramine-acetaminophen (TYLENOL PM) 25-500 MG TABS tablet Take 1 tablet by mouth at bedtime as needed.    [provider]  ibuprofen (ADVIL) 400 MG tablet Take 1 tablet (400 mg total) by mouth every 6 (six) hours as needed. 09/09/20   Tylene Fantasia, PA-C  metoprolol tartrate (LOPRESSOR) 25 MG tablet Take 0.5 tablets (12.5 mg total) by mouth 2 (two) times daily. 07/12/20   Cherene Altes, MD  oxyCODONE (OXY IR/ROXICODONE) 5 MG immediate release tablet Take 1 tablet (5 mg total) by mouth every 6 (six) hours as needed for severe pain or breakthrough pain. 09/09/20   Tylene Fantasia, PA-C  OZEMPIC, 0.25 OR 0.5 MG/DOSE, 2 MG/1.5ML SOPN Inject 1 mg into the skin once a week. Patient taking differently: Inject 1 mg into the skin every Thursday. 05/30/20   Karamalegos, Devonne Doughty, DO  pravastatin (PRAVACHOL) 20 MG tablet Take 1 tablet (20 mg total) by mouth daily. 05/30/20   Karamalegos, Devonne Doughty, DO  zolpidem (AMBIEN) 5 MG tablet Take 1 tablet (5 mg total) by mouth at bedtime as needed for sleep. 08/19/20   [name], DO     Allergies Cyclobenzaprine and Lisinopril  Family History  Problem Relation Age of Onset  . Heart attack Mother   . Heart disease Mother   . Parkinson's disease Father   . Heart attack Maternal Grandfather   . Heart disease Maternal Grandfather   . Heart attack Maternal Aunt   . Heart attack Maternal Grandmother   . Prostate cancer Neg Hx   . Bladder Cancer Neg Hx   . Kidney cancer Neg Hx     Social History Social History   Tobacco Use  . Smoking status: Never Smoker  . Smokeless tobacco: Never Used  . Tobacco comment: once a while had one cigar >10 years ago  Vaping Use  . Vaping Use: Never used  Substance Use  Topics  . Alcohol use: No    Alcohol/week: 0.0 standard drinks  . Drug use: No    Review of Systems  Constitutional: No fever/chills     Gastrointestinal: No abdominal pain.  No nausea, no vomiting.     Skin: Negative for rash.     ____________________________________________   PHYSICAL EXAM:  VITAL SIGNS: ED Triage Vitals  Enc Vitals Group     BP 09/14/20 1042 132/87     Pulse Rate 09/14/20 1042 77     Resp 09/14/20 1042 18     Temp 09/14/20 1042 98.2 F (36.8 C)     Temp Source 09/14/20 1042 Oral     SpO2 09/14/20 1042 98 %     Weight 09/14/20 1041 112 kg (246 lb 14.6 oz)     Height 09/14/20 1041 1.93 m (6\' 4" )     Head Circumference --      Peak Flow --      Pain Score 09/14/20 1041 0     Pain Loc --      Pain Edu? --      Excl. in [place]? --  Constitutional: Alert and oriented. No acute distress. Pleasant and interactive Eyes: Conjunctivae are normal.  Head: Atraumatic. Nose: No congestion/rhinnorhea. Mouth/Throat: Mucous membranes are moist.   Cardiovascular: Normal rate, regular rhythm.  Respiratory: Normal respiratory effort.  No retractions. Abdomen: JP drain in place, no leakage, gauze with Tegaderm overlying in place, no rash or evidence of infection Genitourinary: deferred Musculoskeletal: No lower extremity tenderness nor edema.   Neurologic:  Normal speech and language. No gross focal neurologic deficits are appreciated.   Skin:  Skin is warm, dry and intact. No rash noted.   ____________________________________________   LABS (all labs ordered are listed, but only abnormal results are displayed)  Labs Reviewed - No data to display ____________________________________________  EKG   ____________________________________________  RADIOLOGY   ____________________________________________   PROCEDURES  Procedure(s) performed: No  Procedures   Critical Care performed:  No ____________________________________________   INITIAL IMPRESSION / ASSESSMENT AND PLAN / ED COURSE  Pertinent labs & imaging results that were available during my care of the patient were reviewed by me and considered in my medical decision making (see chart for details).  Dressing replaced.  He reports he was being removed in 2 days   ____________________________________________   FINAL CLINICAL IMPRESSION(S) / ED DIAGNOSES  Final diagnoses:  Encounter for surgical wound dressing change      NEW MEDICATIONS STARTED DURING THIS VISIT:  Discharge Medication List as of 09/14/2020 10:50 AM       Note:  This document was prepared using Dragon voice recognition software and may include unintentional dictation errors.   Lavonia Drafts, MD 09/14/20 1140

## 2020-09-14 NOTE — ED Triage Notes (Signed)
Pt to ED via POV for dressing change at JP drain site. Pt states that he is not able to reach dressing to change it himself. Pt is in NAD,

## 2020-09-15 ENCOUNTER — Ambulatory Visit: Payer: Medicare Other

## 2020-09-16 ENCOUNTER — Other Ambulatory Visit: Payer: Self-pay

## 2020-09-16 ENCOUNTER — Ambulatory Visit (INDEPENDENT_AMBULATORY_CARE_PROVIDER_SITE_OTHER): Payer: Medicare Other | Admitting: General Surgery

## 2020-09-16 ENCOUNTER — Encounter: Payer: Self-pay | Admitting: General Surgery

## 2020-09-16 VITALS — BP 142/90 | HR 76 | Temp 98.3°F | Ht 76.0 in | Wt 250.2 lb

## 2020-09-16 DIAGNOSIS — Z9049 Acquired absence of other specified parts of digestive tract: Secondary | ICD-10-CM

## 2020-09-16 NOTE — Patient Instructions (Signed)

## 2020-09-16 NOTE — Progress Notes (Signed)
Richard Oconnell is here today for a postoperative visit.  He is a 65 year old man who initially presented with severe cholecystitis and required a percutaneous cholecystostomy tube for decompression.  He underwent an interval robot-assisted laparoscopic cholecystectomy on September 08, 2020.  It was an extremely difficult operation, taking nearly 6-1/2 hours, and the gallbladder was full of pus and stones.  A drain was left in place.  He was discharged to home after receiving instructions on how to care for his drain.  Unfortunately, he has not been able to manage this on his own and has made several appearances in the emergency department for both dressing changes and to have the bulb emptied.  He also came to our office to have the bulb emptied on 1 occasion.  He is here today to see if the drain can be removed.  He denies any fevers or chills.  No nausea or vomiting.  His appetite is still a little bit suppressed, but he is eating adequate amounts and having normal bowel function.  He has been enjoying the effect that the oxycodone he was prescribed for postsurgical pain has been having on his arthritis pain.  The drain has been putting out less than 20 cc/day  Today's Vitals   09/16/20 0943  BP: (!) 142/90  Pulse: 76  Temp: 98.3 F (36.8 C)  TempSrc: Oral  SpO2: 95%  Weight: 250 lb 3.2 oz (113.5 kg)  Height: 6\' 4"  (1.93 m)   Body mass index is 30.46 kg/m. Focused abdominal examination: The robotic port sites are healing well.  There is no erythema, induration, or drainage present.  The drain in his left lower quadrant has a small amount of serous fluid present.  I removed his drain today and applied a dry gauze dressing.  He was counseled to leave the dressing in place until tomorrow, when he may remove it to shower.  He should cover the site with a bandage to prevent any drainage from soiling his clothes.  He is otherwise doing well from a postoperative standpoint and I will see him on an as-needed  basis.

## 2020-09-19 ENCOUNTER — Ambulatory Visit (INDEPENDENT_AMBULATORY_CARE_PROVIDER_SITE_OTHER): Payer: Medicare Other | Admitting: Pharmacist

## 2020-09-19 DIAGNOSIS — E785 Hyperlipidemia, unspecified: Secondary | ICD-10-CM | POA: Diagnosis not present

## 2020-09-19 DIAGNOSIS — E1142 Type 2 diabetes mellitus with diabetic polyneuropathy: Secondary | ICD-10-CM

## 2020-09-19 DIAGNOSIS — I10 Essential (primary) hypertension: Secondary | ICD-10-CM | POA: Diagnosis not present

## 2020-09-19 DIAGNOSIS — E1169 Type 2 diabetes mellitus with other specified complication: Secondary | ICD-10-CM

## 2020-09-19 MED ORDER — METOPROLOL TARTRATE 25 MG PO TABS: 12.5000 mg | ORAL_TABLET | Freq: Two times a day (BID) | ORAL | 1 refills | Status: DC

## 2020-09-19 NOTE — Patient Instructions (Signed)
Visit Information  PATIENT GOALS: Goals Addressed            This Visit's Progress   . Pharmacy Goals       Our goal bad cholesterol, or LDL, is less than 70 . This is why it is important to continue taking your pravastatin  Please check your home blood pressure, keep a log of the results and bring this with you to your medical appointments.  Feel free to call me with any questions or concerns. I look forward to our next call!    Harlow Asa, PharmD, [place] [phone]       The patient verbalized understanding of instructions, educational materials, and care plan provided today and declined offer to receive copy of patient instructions, educational materials, and care plan.   Telephone follow up appointment with care management team member scheduled for: 5/20 at 10:45 am

## 2020-09-19 NOTE — Chronic Care Management (AMB) (Signed)
Chronic Care Management Pharmacy Note  09/19/2020 Name:  Richard Oconnell MRN:  [id number] DOB:  1956/01/26  Subjective: Richard Oconnell is an 65 y.o. year old male who is a primary patient of [name], DO.  The CCM team was consulted for assistance with disease management and care coordination needs.    Engaged with patient by telephone for follow up visit in response to provider referral for pharmacy case management and/or care coordination services.   Consent to Services:  The patient was given information about Chronic Care Management services, agreed to services, and gave verbal consent prior to initiation of services.  Please see initial visit note for detailed documentation.   Patient Care Team: [name], DO as PCP - General (Family Medicine) Winfield Cunas Virl Diamond, RPH-CPP as Pharmacist  Recent consult visits: Patient seen in ED on 4/27 and 5/1 for wound check/dressing change Patient seen for Office Visit with [facility] on 5/3 for s/p laparoscopic cholecystectomy  Hospital visits: Medication Reconciliation was completed by comparing discharge summary, patient's EMR and Pharmacy list, and upon discussion with patient.  Admitted to the hospital on 4/25 for planned cholecystectomy. Discharge date was 4/26. Discharged from [facility].   New?Medications Started at [facility] Discharge:?? -started: amoxicillin-clavulanate (Augmentin): 7 day course ibuprofen (ADVIL) oxyCODONE (Oxy IR/ROXICODONE)  Medication Changes at Hospital Discharge: None  Medications Discontinued at Hospital Discharge: None  Medications that remain the same after Hospital Discharge:??  -All other medications will remain the same.    Objective:  Lab Results  Component Value Date   CREATININE 1.49 (H) 09/09/2020   CREATININE 1.10 08/19/2020   CREATININE 1.10 07/21/2020    Lab Results  Component Value Date   HGBA1C  7.2 (H) 05/30/2020   Last diabetic Eye exam: No results found for: HMDIABEYEEXA  Last diabetic Foot exam: No results found for: HMDIABFOOTEX      Component Value Date/Time   CHOL 105 07/21/2020 1108   TRIG 198 (H) 07/21/2020 1108   HDL 33 (L) 07/21/2020 1108   CHOLHDL 3.2 07/21/2020 1108   VLDL 51 (H) 11/24/2016 0818   LDLCALC 45 07/21/2020 1108    Hepatic Function Latest Ref Rng & Units 09/09/2020 08/19/2020 07/21/2020  Total Protein 6.5 - 8.1 g/dL 7.2 7.4 6.8  Albumin 3.5 - 5.0 g/dL 3.0(L) - -  AST 15 - 41 U/L 60(H) 19 24  ALT 0 - 44 U/L 46(H) 17 19  Alk Phosphatase 38 - 126 U/L 154(H) - -  Total Bilirubin 0.3 - 1.2 mg/dL 0.6 0.4 0.5     Social History   Tobacco Use  Smoking Status Never Smoker  Smokeless Tobacco Never Used  Tobacco Comment   once a while had one cigar >10 years ago   BP Readings from Last 3 Encounters:  09/16/20 (!) 142/90  09/14/20 122/85  09/10/20 (!) 150/91   Pulse Readings from Last 3 Encounters:  09/16/20 76  09/14/20 76  09/10/20 (!) 105   Wt Readings from Last 3 Encounters:  09/16/20 250 lb 3.2 oz (113.5 kg)  09/14/20 246 lb 14.6 oz (112 kg)  09/08/20 248 lb (112.5 kg)    Assessment: Review of patient past medical history, allergies, medications, health status, including review of consultants reports, laboratory and other test data, was performed as part of comprehensive evaluation and provision of chronic care management services.   SDOH:  (Social Determinants of Health) assessments and interventions performed: none   CCM Care Plan  Allergies  Allergen Reactions  . Cyclobenzaprine Other (See Comments)    Not improving pain and makes him agressive.  . Lisinopril Cough    Medications Reviewed Today    Reviewed by Vella Raring, RPH-CPP (Pharmacist) on 09/19/20 at 1116  Med List Status: <None>  Medication Order Taking? Sig Documenting Provider Last Dose Status Informant  acetaminophen (TYLENOL) 325 MG tablet [id number] No Take  1 tablet (325 mg total) by mouth every 6 (six) hours as needed for mild pain, fever or headache (or Fever >/= 101).  Patient not taking: Reported on 09/19/2020   Cherene Altes, MD Not Taking Active   diphenhydramine-acetaminophen (TYLENOL PM) 25-500 MG TABS tablet [id number] No Take 1 tablet by mouth at bedtime as needed.  Patient not taking: Reported on 09/19/2020   [provider] Not Taking Active   ibuprofen (ADVIL) 400 MG tablet [id number] Yes Take 1 tablet (400 mg total) by mouth every 6 (six) hours as needed. Tylene Fantasia, PA-C Taking Active   metoprolol tartrate (LOPRESSOR) 25 MG tablet [id number] No Take 0.5 tablets (12.5 mg total) by mouth 2 (two) times daily.  Patient not taking: Reported on 09/19/2020   Cherene Altes, MD Not Taking Active Self  oxyCODONE (OXY IR/ROXICODONE) 5 MG immediate release tablet [id number] Yes Take 1 tablet (5 mg total) by mouth every 6 (six) hours as needed for severe pain or breakthrough pain. Tylene Fantasia, PA-C Taking Active   OZEMPIC, 0.25 OR 0.5 MG/DOSE, 2 MG/1.5ML SOPN [id number] Yes Inject 1 mg into the skin once a week.  Patient taking differently: Inject 1 mg into the skin every Thursday.   [name], DO Taking Active            Med Note Kellie Simmering, Jenene Slicker   Fri Aug 15, 2020  2:42 PM)    pravastatin (PRAVACHOL) 20 MG tablet [id number] Yes Take 1 tablet (20 mg total) by mouth daily. [name], DO Taking Active Self  zolpidem (AMBIEN) 5 MG tablet [id number] Yes Take 1 tablet (5 mg total) by mouth at bedtime as needed for sleep. [name], DO Taking Active           Patient Active Problem List   Diagnosis Date Noted  . S/P laparoscopic cholecystectomy 09/08/2020  . Metabolic syndrome 62/37/6283  . Class 1 obesity due to excess calories with serious comorbidity and body mass index (BMI) of 33.0 to 33.9 in adult 07/06/2019  . Malignant neoplasm of urinary bladder ([place]) 12/06/2018  .  Overweight (BMI 25.0-29.9) 07/04/2018  . Hyperlipidemia associated with type 2 diabetes mellitus ([place]) 11/30/2016  . 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 Gross hematuria 11/16/2016  . Erectile dysfunction associated with type 2 diabetes mellitus ([place]) 05/26/2016  . Diabetes mellitus with coincident hypertension ([place]) 06/11/2015  . Type 2 diabetes mellitus with diabetic polyneuropathy, without long-term current use of insulin ([place]) 06/11/2015    Immunization History  Administered Date(s) Administered  . Moderna Sars-Covid-2 Vaccination 11/30/2019, 12/28/2019  . Pneumococcal Conjugate-13 05/30/2020    Conditions to be addressed/monitored: HTN, HLD and DMII  Care Plan : PharmD - Medication Assistance  Updates made by Vella Raring, RPH-CPP since 09/19/2020 12:00 AM    Problem: Disease Progression     Long-Range Goal: Disease Progression Prevented or Minimized   Start Date: 06/06/2020  Expected End Date: 09/04/2020  This Visit's Progress: On track  Recent Progress: On track  Priority: High  Note:   Current Barriers:  . Difficulty  with independently affording treatment regimen. Reports cost of Ozempic is difficult to afford, particularly when enters coverage gap of [name] o Patient APPROVED for patient assistance from [place] through 05/16/2021  Pharmacist Clinical Goal(s):  Marland Kitchen Over the next 90 days, patient will verbalize ability to afford treatment regimen through collaboration with PharmD and provider.   Interventions: . 1:1 collaboration with [name], DO regarding development and update of comprehensive plan of care as evidenced by provider attestation and co-signature . Inter-disciplinary care team collaboration (see longitudinal plan of care) . Perform chart review o Patient admitted on 4/25 for planned cholecystectomy, discharged on 4/26 o Patient seen in ED on 4/27 and 5/1 for wound check/dressing change o Patient seen for Office  Visit with [facility] on 5/3 for s/p laparoscopic cholecystectomy - Dr. Celine Ahr removed patient's drain . Comprehensive medication review performed; medication list updated in electronic medical record o Patient counseled to use caution with medications that can cause sedation, including oxycodone, zolpidem and Tylenol PM - Patient reports has limited use of oxycodone and primarily using for pain at bedtime - Advise patient to avoid using diphenhydramine product such as Tylenol PM as sleep aid. Patient verbalizes understanding. Reports has used rarely and never in combination with zolpidem o Counsel to take ibuprofen doses with food  Type 2 Diabetes: . Current treatment: o Ozempic 88m weekly on Thursdays . Reports recent home fasting readings ranging: 106-125 . Reports diet recently has been higher in fiber and protein, limiting carbohydrates   Hypertension/ Newly Diagnosed Atrial Fibrillation . Identify patient ran out of metoprolol ~ 1 week ago and restarted taking valsartan 80 mg daily. o Note patient previously started on metoprolol 25 mg -  tablet twice daily at hospital discharge on 2/26 as diagnosed transient atrial fibrillation with RVR during admission. Valsartan stopped at that time. . Reports recent home blood pressure readings ranging: ~140s/85 . Consult with Dr. KParks Rangerregarding plan to have patient RESTART metoprolol 25 mg -  tablet twice daily. Patient to STOP the valsartan with restart of metoprolol o PCP agrees and CPP will send Rx to pharmacy o Patient counseled and verbalizes understanding via teach back. o Counsel patient to monitor home BP, keep log of results and to contact office if readings consistently >140/90 or new or worsening symptoms. o Schedule follow up appointment to review BP & HR results in 2 weeks.   Hyperlipidemia:   Controlled; current treatment: pravastatin 20 mg daily  Patient reports will work on slowly increasing  activity/exercise, but following instructions/limits from [place]   Patient Goals/Self-Care Activities . Over the next 90 days, patient will:  - check glucose, document, and provide at future appointments - check blood pressure, document, and provide at future appointments - collaborate with provider on medication access solutions - attend medical appointments as scheduled  Follow Up Plan:  Telephone follow up appointment with care management team member scheduled for: 5/20 at 10:45 am     Medication Assistance: Patient APPR[place]ED for patient assistance from [place] 05/16/2021  Patient's preferred pharmacy is:  [place]Phone: [phone]: [address] [address] [address] [place]: [phone] Fax: [phone] [place]  [place] [place] [address] Phone: [phone] Fax: [phone]  Uses pill box? No - Counsel patient today and encourge to start using weekly pillbox  Follow Up:  Patient agrees to Care Plan and Follow-up.  Harlow Asa, PharmD, Para [place], CPP Clinical Pharmacist [facility] [phone]

## 2020-09-25 ENCOUNTER — Emergency Department: Payer: Medicare Other

## 2020-09-25 ENCOUNTER — Other Ambulatory Visit: Payer: Self-pay

## 2020-09-25 ENCOUNTER — Inpatient Hospital Stay
Admission: EM | Admit: 2020-09-25 | Discharge: 2020-10-15 | DRG: 862 | Disposition: A | Discharge status: Skilled Nursing Facility | Payer: Medicare Other | Attending: Internal Medicine | Admitting: Internal Medicine

## 2020-09-25 ENCOUNTER — Inpatient Hospital Stay: Payer: Medicare Other

## 2020-09-25 ENCOUNTER — Encounter: Payer: Self-pay | Admitting: Emergency Medicine

## 2020-09-25 DIAGNOSIS — D649 Anemia, unspecified: Secondary | ICD-10-CM | POA: Diagnosis not present

## 2020-09-25 DIAGNOSIS — D631 Anemia in chronic kidney disease: Secondary | ICD-10-CM | POA: Diagnosis present

## 2020-09-25 DIAGNOSIS — Z888 Allergy status to other drugs, medicaments and biological substances status: Secondary | ICD-10-CM

## 2020-09-25 DIAGNOSIS — Z7189 Other specified counseling: Secondary | ICD-10-CM | POA: Diagnosis not present

## 2020-09-25 DIAGNOSIS — K750 Abscess of liver: Secondary | ICD-10-CM | POA: Diagnosis not present

## 2020-09-25 DIAGNOSIS — R404 Transient alteration of awareness: Secondary | ICD-10-CM | POA: Diagnosis not present

## 2020-09-25 DIAGNOSIS — E1369 Other specified diabetes mellitus with other specified complication: Secondary | ICD-10-CM | POA: Diagnosis present

## 2020-09-25 DIAGNOSIS — T8143XA Infection following a procedure, organ and space surgical site, initial encounter: Secondary | ICD-10-CM | POA: Diagnosis not present

## 2020-09-25 DIAGNOSIS — E876 Hypokalemia: Secondary | ICD-10-CM | POA: Diagnosis not present

## 2020-09-25 DIAGNOSIS — E870 Hyperosmolality and hypernatremia: Secondary | ICD-10-CM | POA: Diagnosis not present

## 2020-09-25 DIAGNOSIS — I4892 Unspecified atrial flutter: Secondary | ICD-10-CM | POA: Diagnosis not present

## 2020-09-25 DIAGNOSIS — N189 Chronic kidney disease, unspecified: Secondary | ICD-10-CM | POA: Diagnosis not present

## 2020-09-25 DIAGNOSIS — A419 Sepsis, unspecified organism: Secondary | ICD-10-CM | POA: Diagnosis not present

## 2020-09-25 DIAGNOSIS — J9602 Acute respiratory failure with hypercapnia: Secondary | ICD-10-CM | POA: Diagnosis not present

## 2020-09-25 DIAGNOSIS — E1165 Type 2 diabetes mellitus with hyperglycemia: Secondary | ICD-10-CM | POA: Diagnosis not present

## 2020-09-25 DIAGNOSIS — E871 Hypo-osmolality and hyponatremia: Secondary | ICD-10-CM | POA: Diagnosis not present

## 2020-09-25 DIAGNOSIS — K5750 Diverticulosis of both small and large intestine without perforation or abscess without bleeding: Secondary | ICD-10-CM | POA: Diagnosis not present

## 2020-09-25 DIAGNOSIS — I700 Atherosclerosis of aorta: Secondary | ICD-10-CM | POA: Diagnosis not present

## 2020-09-25 DIAGNOSIS — K651 Peritoneal abscess: Secondary | ICD-10-CM | POA: Diagnosis not present

## 2020-09-25 DIAGNOSIS — G319 Degenerative disease of nervous system, unspecified: Secondary | ICD-10-CM | POA: Diagnosis not present

## 2020-09-25 DIAGNOSIS — G928 Other toxic encephalopathy: Secondary | ICD-10-CM | POA: Diagnosis present

## 2020-09-25 DIAGNOSIS — T8149XA Infection following a procedure, other surgical site, initial encounter: Secondary | ICD-10-CM | POA: Diagnosis not present

## 2020-09-25 DIAGNOSIS — N182 Chronic kidney disease, stage 2 (mild): Secondary | ICD-10-CM | POA: Diagnosis present

## 2020-09-25 DIAGNOSIS — R188 Other ascites: Secondary | ICD-10-CM | POA: Diagnosis not present

## 2020-09-25 DIAGNOSIS — M2550 Pain in unspecified joint: Secondary | ICD-10-CM | POA: Diagnosis not present

## 2020-09-25 DIAGNOSIS — Z8551 Personal history of malignant neoplasm of bladder: Secondary | ICD-10-CM

## 2020-09-25 DIAGNOSIS — J9601 Acute respiratory failure with hypoxia: Secondary | ICD-10-CM | POA: Diagnosis not present

## 2020-09-25 DIAGNOSIS — R42 Dizziness and giddiness: Secondary | ICD-10-CM | POA: Diagnosis not present

## 2020-09-25 DIAGNOSIS — E1151 Type 2 diabetes mellitus with diabetic peripheral angiopathy without gangrene: Secondary | ICD-10-CM | POA: Diagnosis not present

## 2020-09-25 DIAGNOSIS — T8144XA Sepsis following a procedure, initial encounter: Secondary | ICD-10-CM | POA: Diagnosis not present

## 2020-09-25 DIAGNOSIS — G9341 Metabolic encephalopathy: Secondary | ICD-10-CM | POA: Diagnosis not present

## 2020-09-25 DIAGNOSIS — Z820 Family history of epilepsy and other diseases of the nervous system: Secondary | ICD-10-CM

## 2020-09-25 DIAGNOSIS — I480 Paroxysmal atrial fibrillation: Secondary | ICD-10-CM | POA: Diagnosis not present

## 2020-09-25 DIAGNOSIS — Z20822 Contact with and (suspected) exposure to covid-19: Secondary | ICD-10-CM | POA: Diagnosis not present

## 2020-09-25 DIAGNOSIS — R6520 Severe sepsis without septic shock: Secondary | ICD-10-CM | POA: Diagnosis not present

## 2020-09-25 DIAGNOSIS — A4181 Sepsis due to Enterococcus: Secondary | ICD-10-CM | POA: Diagnosis not present

## 2020-09-25 DIAGNOSIS — N179 Acute kidney failure, unspecified: Secondary | ICD-10-CM | POA: Diagnosis not present

## 2020-09-25 DIAGNOSIS — I129 Hypertensive chronic kidney disease with stage 1 through stage 4 chronic kidney disease, or unspecified chronic kidney disease: Secondary | ICD-10-CM | POA: Diagnosis not present

## 2020-09-25 DIAGNOSIS — E1122 Type 2 diabetes mellitus with diabetic chronic kidney disease: Secondary | ICD-10-CM | POA: Diagnosis present

## 2020-09-25 DIAGNOSIS — E872 Acidosis: Secondary | ICD-10-CM | POA: Diagnosis not present

## 2020-09-25 DIAGNOSIS — Z452 Encounter for adjustment and management of vascular access device: Secondary | ICD-10-CM

## 2020-09-25 DIAGNOSIS — Z515 Encounter for palliative care: Secondary | ICD-10-CM | POA: Diagnosis not present

## 2020-09-25 DIAGNOSIS — N170 Acute kidney failure with tubular necrosis: Secondary | ICD-10-CM | POA: Diagnosis not present

## 2020-09-25 DIAGNOSIS — Z8249 Family history of ischemic heart disease and other diseases of the circulatory system: Secondary | ICD-10-CM

## 2020-09-25 DIAGNOSIS — B952 Enterococcus as the cause of diseases classified elsewhere: Secondary | ICD-10-CM | POA: Diagnosis not present

## 2020-09-25 DIAGNOSIS — E669 Obesity, unspecified: Secondary | ICD-10-CM | POA: Diagnosis present

## 2020-09-25 DIAGNOSIS — L0291 Cutaneous abscess, unspecified: Secondary | ICD-10-CM | POA: Diagnosis not present

## 2020-09-25 DIAGNOSIS — R4701 Aphasia: Secondary | ICD-10-CM | POA: Diagnosis present

## 2020-09-25 DIAGNOSIS — E878 Other disorders of electrolyte and fluid balance, not elsewhere classified: Secondary | ICD-10-CM | POA: Diagnosis not present

## 2020-09-25 DIAGNOSIS — L89152 Pressure ulcer of sacral region, stage 2: Secondary | ICD-10-CM | POA: Diagnosis present

## 2020-09-25 DIAGNOSIS — R918 Other nonspecific abnormal finding of lung field: Secondary | ICD-10-CM | POA: Diagnosis not present

## 2020-09-25 DIAGNOSIS — E1142 Type 2 diabetes mellitus with diabetic polyneuropathy: Secondary | ICD-10-CM | POA: Diagnosis not present

## 2020-09-25 DIAGNOSIS — J9811 Atelectasis: Secondary | ICD-10-CM | POA: Diagnosis present

## 2020-09-25 DIAGNOSIS — I4891 Unspecified atrial fibrillation: Secondary | ICD-10-CM | POA: Diagnosis not present

## 2020-09-25 DIAGNOSIS — R7881 Bacteremia: Secondary | ICD-10-CM | POA: Diagnosis not present

## 2020-09-25 DIAGNOSIS — K810 Acute cholecystitis: Secondary | ICD-10-CM | POA: Diagnosis not present

## 2020-09-25 DIAGNOSIS — R935 Abnormal findings on diagnostic imaging of other abdominal regions, including retroperitoneum: Secondary | ICD-10-CM | POA: Diagnosis not present

## 2020-09-25 DIAGNOSIS — K6819 Other retroperitoneal abscess: Secondary | ICD-10-CM | POA: Diagnosis not present

## 2020-09-25 DIAGNOSIS — G9340 Encephalopathy, unspecified: Secondary | ICD-10-CM | POA: Diagnosis not present

## 2020-09-25 DIAGNOSIS — E785 Hyperlipidemia, unspecified: Secondary | ICD-10-CM | POA: Diagnosis present

## 2020-09-25 DIAGNOSIS — E43 Unspecified severe protein-calorie malnutrition: Secondary | ICD-10-CM | POA: Diagnosis not present

## 2020-09-25 DIAGNOSIS — Z9049 Acquired absence of other specified parts of digestive tract: Secondary | ICD-10-CM

## 2020-09-25 DIAGNOSIS — A4189 Other specified sepsis: Secondary | ICD-10-CM | POA: Diagnosis not present

## 2020-09-25 DIAGNOSIS — N281 Cyst of kidney, acquired: Secondary | ICD-10-CM | POA: Diagnosis not present

## 2020-09-25 DIAGNOSIS — Z6833 Body mass index (BMI) 33.0-33.9, adult: Secondary | ICD-10-CM

## 2020-09-25 DIAGNOSIS — K828 Other specified diseases of gallbladder: Secondary | ICD-10-CM | POA: Diagnosis not present

## 2020-09-25 DIAGNOSIS — R6521 Severe sepsis with septic shock: Secondary | ICD-10-CM | POA: Diagnosis not present

## 2020-09-25 DIAGNOSIS — J9600 Acute respiratory failure, unspecified whether with hypoxia or hypercapnia: Secondary | ICD-10-CM

## 2020-09-25 DIAGNOSIS — K7290 Hepatic failure, unspecified without coma: Secondary | ICD-10-CM | POA: Diagnosis not present

## 2020-09-25 DIAGNOSIS — Z7401 Bed confinement status: Secondary | ICD-10-CM | POA: Diagnosis not present

## 2020-09-25 DIAGNOSIS — B9689 Other specified bacterial agents as the cause of diseases classified elsewhere: Secondary | ICD-10-CM | POA: Diagnosis not present

## 2020-09-25 DIAGNOSIS — K9189 Other postprocedural complications and disorders of digestive system: Secondary | ICD-10-CM | POA: Diagnosis not present

## 2020-09-25 DIAGNOSIS — Z4682 Encounter for fitting and adjustment of non-vascular catheter: Secondary | ICD-10-CM | POA: Diagnosis not present

## 2020-09-25 DIAGNOSIS — R17 Unspecified jaundice: Secondary | ICD-10-CM | POA: Diagnosis not present

## 2020-09-25 DIAGNOSIS — R1900 Intra-abdominal and pelvic swelling, mass and lump, unspecified site: Secondary | ICD-10-CM | POA: Diagnosis not present

## 2020-09-25 DIAGNOSIS — L02211 Cutaneous abscess of abdominal wall: Secondary | ICD-10-CM | POA: Diagnosis not present

## 2020-09-25 DIAGNOSIS — R4182 Altered mental status, unspecified: Secondary | ICD-10-CM

## 2020-09-25 DIAGNOSIS — L8990 Pressure ulcer of unspecified site, unspecified stage: Secondary | ICD-10-CM | POA: Insufficient documentation

## 2020-09-25 DIAGNOSIS — N171 Acute kidney failure with acute cortical necrosis: Secondary | ICD-10-CM | POA: Diagnosis not present

## 2020-09-25 DIAGNOSIS — Z79899 Other long term (current) drug therapy: Secondary | ICD-10-CM

## 2020-09-25 DIAGNOSIS — R55 Syncope and collapse: Secondary | ICD-10-CM | POA: Diagnosis not present

## 2020-09-25 DIAGNOSIS — R410 Disorientation, unspecified: Secondary | ICD-10-CM | POA: Diagnosis not present

## 2020-09-25 DIAGNOSIS — I498 Other specified cardiac arrhythmias: Secondary | ICD-10-CM | POA: Diagnosis not present

## 2020-09-25 DIAGNOSIS — R4781 Slurred speech: Secondary | ICD-10-CM | POA: Diagnosis not present

## 2020-09-25 DIAGNOSIS — Z978 Presence of other specified devices: Secondary | ICD-10-CM

## 2020-09-25 DIAGNOSIS — J90 Pleural effusion, not elsewhere classified: Secondary | ICD-10-CM | POA: Diagnosis not present

## 2020-09-25 DIAGNOSIS — Z4659 Encounter for fitting and adjustment of other gastrointestinal appliance and device: Secondary | ICD-10-CM

## 2020-09-25 LAB — CBC
HCT: 34.1 % — ABNORMAL LOW (ref 39.0–52.0)
Hemoglobin: 11.5 g/dL — ABNORMAL LOW (ref 13.0–17.0)
MCH: 27.1 pg (ref 26.0–34.0)
MCHC: 33.7 g/dL (ref 30.0–36.0)
MCV: 80.4 fL (ref 80.0–100.0)
Platelets: 535 10*3/uL — ABNORMAL HIGH (ref 150–400)
RBC: 4.24 MIL/uL (ref 4.22–5.81)
RDW: 14.9 % (ref 11.5–15.5)
WBC: 25.5 10*3/uL — ABNORMAL HIGH (ref 4.0–10.5)
nRBC: 0 % (ref 0.0–0.2)

## 2020-09-25 LAB — AMMONIA: Ammonia: 19 umol/L (ref 9–35)

## 2020-09-25 LAB — COMPREHENSIVE METABOLIC PANEL
ALT: 56 U/L — ABNORMAL HIGH (ref 0–44)
AST: 121 U/L — ABNORMAL HIGH (ref 15–41)
Albumin: 2.6 g/dL — ABNORMAL LOW (ref 3.5–5.0)
Alkaline Phosphatase: 459 U/L — ABNORMAL HIGH (ref 38–126)
Anion gap: 22 — ABNORMAL HIGH (ref 5–15)
BUN: 46 mg/dL — ABNORMAL HIGH (ref 8–23)
CO2: 17 mmol/L — ABNORMAL LOW (ref 22–32)
Calcium: 8.5 mg/dL — ABNORMAL LOW (ref 8.9–10.3)
Chloride: 96 mmol/L — ABNORMAL LOW (ref 98–111)
Creatinine, Ser: 4.89 mg/dL — ABNORMAL HIGH (ref 0.61–1.24)
GFR, Estimated: 12 mL/min — ABNORMAL LOW (ref >60)
Glucose, Bld: 84 mg/dL (ref 70–99)
Potassium: 3.1 mmol/L — ABNORMAL LOW (ref 3.5–5.1)
Sodium: 135 mmol/L (ref 135–145)
Total Bilirubin: 9.5 mg/dL — ABNORMAL HIGH (ref 0.3–1.2)
Total Protein: 8.2 g/dL — ABNORMAL HIGH (ref 6.5–8.1)

## 2020-09-25 LAB — RESP PANEL BY RT-PCR (FLU A&B, COVID) ARPGX2
Influenza A by PCR: NEGATIVE
Influenza B by PCR: NEGATIVE
SARS Coronavirus 2 by RT PCR: NEGATIVE

## 2020-09-25 LAB — BLOOD GAS, VENOUS
Acid-base deficit: 7.5 mmol/L — ABNORMAL HIGH (ref 0.0–2.0)
Acid-base deficit: 5.7 mmol/L — ABNORMAL HIGH (ref 0.0–2.0)
Bicarbonate: 18.8 mmol/L — ABNORMAL LOW (ref 20.0–28.0)
Bicarbonate: 19.3 mmol/L — ABNORMAL LOW (ref 20.0–28.0)
O2 Saturation: 77.5 %
O2 Saturation: 73.2 %
Patient temperature: 37
Patient temperature: 37
pCO2, Ven: 40 mmHg — ABNORMAL LOW (ref 44.0–60.0)
pCO2, Ven: 35 mmHg — ABNORMAL LOW (ref 44.0–60.0)
pH, Ven: 7.28 (ref 7.250–7.430)
pH, Ven: 7.35 (ref 7.250–7.430)
pO2, Ven: 48 mmHg — ABNORMAL HIGH (ref 32.0–45.0)
pO2, Ven: 41 mmHg (ref 32.0–45.0)

## 2020-09-25 LAB — LACTIC ACID, PLASMA
Lactic Acid, Venous: 6.5 mmol/L (ref 0.5–1.9)
Lactic Acid, Venous: 8.7 mmol/L (ref 0.5–1.9)
Lactic Acid, Venous: 4.4 mmol/L (ref 0.5–1.9)
Lactic Acid, Venous: 3.1 mmol/L (ref 0.5–1.9)

## 2020-09-25 LAB — DIFFERENTIAL
Abs Immature Granulocytes: 0.28 10*3/uL — ABNORMAL HIGH (ref 0.00–0.07)
Basophils Absolute: 0.1 10*3/uL (ref 0.0–0.1)
Basophils Relative: 0 %
Eosinophils Absolute: 0.1 10*3/uL (ref 0.0–0.5)
Eosinophils Relative: 1 %
Immature Granulocytes: 1 %
Lymphocytes Relative: 2 %
Lymphs Abs: 0.5 10*3/uL — ABNORMAL LOW (ref 0.7–4.0)
Monocytes Absolute: 0.5 10*3/uL (ref 0.1–1.0)
Monocytes Relative: 2 %
Neutro Abs: 24 10*3/uL — ABNORMAL HIGH (ref 1.7–7.7)
Neutrophils Relative %: 94 %
Smear Review: NORMAL

## 2020-09-25 LAB — APTT: aPTT: 40 seconds — ABNORMAL HIGH (ref 24–36)

## 2020-09-25 LAB — PROTIME-INR
INR: 1.5 — ABNORMAL HIGH (ref 0.8–1.2)
Prothrombin Time: 17.7 seconds — ABNORMAL HIGH (ref 11.4–15.2)

## 2020-09-25 LAB — PHOSPHORUS: Phosphorus: 5.1 mg/dL — ABNORMAL HIGH (ref 2.5–4.6)

## 2020-09-25 LAB — MRSA PCR SCREENING: MRSA by PCR: NEGATIVE

## 2020-09-25 LAB — CBG MONITORING, ED: Glucose-Capillary: 88 mg/dL (ref 70–99)

## 2020-09-25 LAB — GLUCOSE, CAPILLARY
Glucose-Capillary: 96 mg/dL (ref 70–99)
Glucose-Capillary: 134 mg/dL — ABNORMAL HIGH (ref 70–99)

## 2020-09-25 LAB — MAGNESIUM: Magnesium: 2.3 mg/dL (ref 1.7–2.4)

## 2020-09-25 MED ORDER — SODIUM CHLORIDE 0.9% FLUSH
3.0000 mL | Freq: Two times a day (BID) | INTRAVENOUS | Status: DC
  Administered 2020-09-25 – 2020-10-15 (×34): 3 mL via INTRAVENOUS

## 2020-09-25 MED ORDER — PIPERACILLIN-TAZOBACTAM 3.375 G IVPB
3.3750 g | Freq: Three times a day (TID) | INTRAVENOUS | Status: DC
  Administered 2020-09-26 – 2020-09-27 (×5): 3.375 g via INTRAVENOUS
  Filled 2020-09-25 (×5): qty 50

## 2020-09-25 MED ORDER — DOCUSATE SODIUM 100 MG PO CAPS: 100.0000 mg | ORAL_CAPSULE | Freq: Two times a day (BID) | ORAL | Status: DC | PRN

## 2020-09-25 MED ORDER — LACTATED RINGERS IV BOLUS
1000.0000 mL | Freq: Once | INTRAVENOUS | Status: AC
  Administered 2020-09-25: 1000 mL via INTRAVENOUS

## 2020-09-25 MED ORDER — PIPERACILLIN-TAZOBACTAM 3.375 G IVPB 30 MIN
3.3750 g | Freq: Once | INTRAVENOUS | Status: AC
  Administered 2020-09-25: 3.375 g via INTRAVENOUS
  Filled 2020-09-25: qty 50

## 2020-09-25 MED ORDER — POLYETHYLENE GLYCOL 3350 17 G PO PACK: 17.0000 g | PACK | Freq: Every day | ORAL | Status: DC | PRN

## 2020-09-25 MED ORDER — SODIUM CHLORIDE 0.9 % IV SOLN
250.0000 mL | INTRAVENOUS | Status: DC
  Administered 2020-10-06: 250 mL via INTRAVENOUS

## 2020-09-25 MED ORDER — SODIUM BICARBONATE 8.4 % IV SOLN
100.0000 meq | Freq: Once | INTRAVENOUS | Status: AC
  Administered 2020-09-25: 100 meq via INTRAVENOUS
  Filled 2020-09-25: qty 100

## 2020-09-25 MED ORDER — LACTATED RINGERS IV BOLUS
500.0000 mL | Freq: Once | INTRAVENOUS | Status: AC
  Administered 2020-09-25: 500 mL via INTRAVENOUS

## 2020-09-25 MED ORDER — FENTANYL CITRATE (PF) 100 MCG/2ML IJ SOLN
INTRAMUSCULAR | Status: AC
  Filled 2020-09-25: qty 2

## 2020-09-25 MED ORDER — PANTOPRAZOLE SODIUM 40 MG IV SOLR
40.0000 mg | INTRAVENOUS | Status: DC
  Administered 2020-09-25 – 2020-10-14 (×20): 40 mg via INTRAVENOUS
  Filled 2020-09-25 (×20): qty 40

## 2020-09-25 MED ORDER — FAMOTIDINE IN NACL 20-0.9 MG/50ML-% IV SOLN
20.0000 mg | INTRAVENOUS | Status: DC
  Administered 2020-09-25: 20 mg via INTRAVENOUS
  Filled 2020-09-25: qty 50

## 2020-09-25 MED ORDER — ONDANSETRON HCL 4 MG/2ML IJ SOLN: 4.0000 mg | Freq: Four times a day (QID) | INTRAMUSCULAR | Status: DC | PRN

## 2020-09-25 MED ORDER — SODIUM CHLORIDE 0.9% FLUSH
3.0000 mL | INTRAVENOUS | Status: DC | PRN
  Administered 2020-09-27 – 2020-10-07 (×3): 3 mL via INTRAVENOUS

## 2020-09-25 MED ORDER — FENTANYL CITRATE (PF) 100 MCG/2ML IJ SOLN
INTRAMUSCULAR | Status: AC | PRN
  Administered 2020-09-25: 50 ug via INTRAVENOUS

## 2020-09-25 MED ORDER — MIDAZOLAM HCL 5 MG/5ML IJ SOLN
INTRAMUSCULAR | Status: AC | PRN
  Administered 2020-09-25: 1 mg via INTRAVENOUS

## 2020-09-25 MED ORDER — NOREPINEPHRINE 4 MG/250ML-% IV SOLN: 2.0000 ug/min | INTRAVENOUS | Status: DC

## 2020-09-25 MED ORDER — ACETAMINOPHEN 325 MG PO TABS: 650.0000 mg | ORAL_TABLET | ORAL | Status: DC | PRN

## 2020-09-25 MED ORDER — SODIUM CHLORIDE 0.9 % IV SOLN
250.0000 mL | INTRAVENOUS | Status: DC
  Administered 2020-10-03: 250 mL via INTRAVENOUS

## 2020-09-25 MED ORDER — NOREPINEPHRINE 4 MG/250ML-% IV SOLN
0.0000 ug/min | INTRAVENOUS | Status: DC
  Administered 2020-09-25: 2 ug/min via INTRAVENOUS
  Administered 2020-09-26: 7 ug/min via INTRAVENOUS
  Administered 2020-09-27: 5 ug/min via INTRAVENOUS
  Administered 2020-09-27: 4 ug/min via INTRAVENOUS
  Administered 2020-09-27: 9 ug/min via INTRAVENOUS
  Administered 2020-09-28 (×2): 7 ug/min via INTRAVENOUS
  Administered 2020-09-29: 4 ug/min via INTRAVENOUS
  Filled 2020-09-25 (×8): qty 250

## 2020-09-25 MED ORDER — SODIUM CHLORIDE 0.9 % IV SOLN: 250.0000 mL | INTRAVENOUS | Status: DC | PRN

## 2020-09-25 MED ORDER — ORAL CARE MOUTH RINSE
15.0000 mL | Freq: Two times a day (BID) | OROMUCOSAL | Status: DC
  Administered 2020-09-26: 15 mL via OROMUCOSAL

## 2020-09-25 MED ORDER — MIDAZOLAM HCL 5 MG/5ML IJ SOLN
INTRAMUSCULAR | Status: AC
  Filled 2020-09-25: qty 5

## 2020-09-25 MED ORDER — SODIUM CHLORIDE 0.9% FLUSH
5.0000 mL | Freq: Three times a day (TID) | INTRAVENOUS | Status: DC
  Administered 2020-09-26 – 2020-10-08 (×29): 5 mL

## 2020-09-25 MED ORDER — CHLORHEXIDINE GLUCONATE CLOTH 2 % EX PADS
6.0000 | MEDICATED_PAD | Freq: Every day | CUTANEOUS | Status: DC
  Administered 2020-09-25 – 2020-10-14 (×12): 6 via TOPICAL

## 2020-09-25 MED ORDER — HYDROCORTISONE NA SUCCINATE PF 100 MG IJ SOLR
50.0000 mg | Freq: Four times a day (QID) | INTRAMUSCULAR | Status: DC
  Administered 2020-09-26 – 2020-09-29 (×14): 50 mg via INTRAVENOUS
  Filled 2020-09-25 (×14): qty 2

## 2020-09-25 NOTE — Consult Note (Signed)
Subjective:   CC: abdominal abscess, jaundice  HPI:  Richard Oconnell is a 65 y.o. male who is consulted by [facility] for evaluation of above cc.  Symptoms were first noted 1 day ago. Pain is dull, associated with dizziness and confusion, along with chills, exacerbated by nothing specific.  Recently admitted for acute cholecystitis, requiring cholecystostomy tube placement and interval cholecystectomy.  Interval cholecystectomy was prolonged due to multiple adhesions and required a drain placement afterwards.  Surgery was on April 25, most recent office visit on May 5, at which time patient was deemed healed enough to have the drain removed.  He presents today with acute onset confusion and weakness, with work-up showing very large intra-abdominal abscess within the gallbladder fossa along with elevated LFTs.  Per RN report, patient mentation has improved significantly since admission, and he is able to hold a conversation.  Still requiring 7 mcg/min of Levophed and currently receiving another fluid bolus.     Past Medical History:  has a past medical history of Cancer [facility]), Erectile dysfunction associated with type 2 diabetes mellitus ([place]) (05/26/2016), Gross hematuria (11/16/2016), Hypertension (06/11/2015), and Type 2 diabetes mellitus with diabetic polyneuropathy, without long-term current use of insulin ([place]) (06/11/2015).  Past Surgical History:  has a past surgical history that includes boil lanceted; ingrown  (Bilateral); Transurethral resection of bladder tumor (N/A, 12/06/2016); Cystoscopy w/ retrogrades (Bilateral, 12/06/2016); Transurethral resection of bladder tumor with mitomycin-c (N/A, 01/18/2017); Cystoscopy with biopsy (N/A, 06/27/2017); Cystoscopy w/ retrogrades (Bilateral, 06/27/2017); Transurethral resection of bladder tumor (N/A, 06/27/2017); Cystoscopy with biopsy (N/A, 10/24/2017); Colonoscopy with propofol (N/A, 07/03/2020); IR BILIARY DRAIN PLACEMENT WITH CHOLANGIOGRAM (07/10/2020); IR  Radiologist Eval & Mgmt (08/07/2020); and IR CHOLANGIOGRAM EXISTING TUBE (08/07/2020).  Family History: family history includes Heart attack in his maternal aunt, maternal grandfather, maternal grandmother, and mother; Heart disease in his maternal grandfather and mother; Parkinson's disease in his father.  Social History:  reports that he has never smoked. He has never used smokeless tobacco. He reports that he does not drink alcohol and does not use drugs.  Current Medications:  Prior to Admission medications   Medication Sig Start Date End Date Taking? Authorizing Provider  ibuprofen (ADVIL) 400 MG tablet Take 1 tablet (400 mg total) by mouth every 6 (six) hours as needed. 09/09/20  Yes Edison Simon R, PA-C  metoprolol tartrate (LOPRESSOR) 25 MG tablet Take 0.5 tablets (12.5 mg total) by mouth 2 (two) times daily. 09/19/20  Yes Karamalegos, Devonne Doughty, DO  oxyCODONE (OXY IR/ROXICODONE) 5 MG immediate release tablet Take 1 tablet (5 mg total) by mouth every 6 (six) hours as needed for severe pain or breakthrough pain. 09/09/20  Yes Edison Simon R, PA-C  pravastatin (PRAVACHOL) 20 MG tablet Take 1 tablet (20 mg total) by mouth daily. 05/30/20  Yes Karamalegos, Devonne Doughty, DO  zolpidem (AMBIEN) 5 MG tablet Take 1 tablet (5 mg total) by mouth at bedtime as needed for sleep. 08/19/20  Yes Karamalegos, Devonne Doughty, DO  acetaminophen (TYLENOL) 325 MG tablet Take 1 tablet (325 mg total) by mouth every 6 (six) hours as needed for mild pain, fever or headache (or Fever >/= 101). Patient not taking: No sig reported 07/12/20   Cherene Altes, MD  diphenhydramine-acetaminophen (TYLENOL PM) 25-500 MG TABS tablet Take 1 tablet by mouth at bedtime as needed. Patient not taking: No sig reported    [provider]  OZEMPIC, 0.25 OR 0.5 MG/DOSE, 2 MG/1.5ML SOPN Inject 1 mg into the skin once a  week. Patient taking differently: Inject 1 mg into the skin every Thursday. 05/30/20   [name], DO    Allergies:  Allergies as of 09/25/2020 - Review Complete 09/25/2020  Allergen Reaction Noted  . Cyclobenzaprine Other (See Comments) 09/29/2017  . Lisinopril Cough 09/29/2017    ROS:  General: Denies weight loss, weight gain, fatigue, fevers,  and night sweats. Eyes: Denies blurry vision, double vision, eye pain, itchy eyes, and tearing. Ears: Denies hearing loss, earache, and ringing in ears. Nose: Denies sinus pain, congestion, infections, runny nose, and nosebleeds. Mouth/throat: Denies hoarseness, sore throat, bleeding gums, and difficulty swallowing. Heart: Denies chest pain, palpitations, racing heart, irregular heartbeat, leg pain or swelling, and decreased activity tolerance. Respiratory: Denies breathing difficulty, shortness of breath, wheezing, cough, and sputum. GI: Denies change in appetite, heartburn, nausea, vomiting, constipation, diarrhea, and blood in stool. GU: Denies difficulty urinating, pain with urinating, urgency, frequency, blood in urine. Musculoskeletal: Denies joint stiffness, pain, swelling, muscle weakness. Skin: Denies rash, itching, mass, tumors, sores, and boils Neurologic: Denies headache, fainting, dizziness, seizures, numbness, and tingling. Psychiatric: Denies depression, anxiety, difficulty sleeping, and memory loss. Endocrine: Denies heat or cold intolerance, and increased thirst or urination. Blood/lymph: Denies easy bruising, and swollen glands     Objective:     BP (!) 82/61   Pulse 94   Temp 98.3 F (36.8 C) (Oral)   Resp (!) 23   Ht 6\' 4"  (1.93 m)   Wt 113 kg   SpO2 99%   BMI 30.32 kg/m    Constitutional :  alert, cooperative, appears stated age and no distress  Lymphatics/Throat:  no asymmetry, masses, or scars  Respiratory:  clear to auscultation bilaterally  Cardiovascular:  regular rate and rhythm  Gastrointestinal: soft, no guarding, but some fullness noted in the right upper quadrant with minor tenderness to  palpation.   Musculoskeletal: Steady movement  Skin: Cool and moist, jaundiced  Psychiatric: Normal affect, non-agitated, not confused       LABS:  CMP Latest Ref Rng & Units 09/25/2020 09/09/2020 08/19/2020  Glucose 70 - 99 mg/dL 84 154(H) 102(H)  BUN 8 - 23 mg/dL 46(H) 21 18  Creatinine 0.61 - 1.24 mg/dL 4.89(H) 1.49(H) 1.10  Sodium 135 - 145 mmol/L 135 136 136  Potassium 3.5 - 5.1 mmol/L 3.1(L) 4.5 4.6  Chloride 98 - 111 mmol/L 96(L) 104 99  CO2 22 - 32 mmol/L 17(L) 21(L) 28  Calcium 8.9 - 10.3 mg/dL 8.5(L) 8.3(L) 9.5  Total Protein 6.5 - 8.1 g/dL 8.2(H) 7.2 7.4  Total Bilirubin 0.3 - 1.2 mg/dL 9.5(H) 0.6 0.4  Alkaline Phos 38 - 126 U/L 459(H) 154(H) -  AST 15 - 41 U/L 121(H) 60(H) 19  ALT 0 - 44 U/L 56(H) 46(H) 17   CBC Latest Ref Rng & Units 09/25/2020 09/09/2020 08/19/2020  WBC 4.0 - 10.5 K/uL 25.5(H) 12.8(H) 8.8  Hemoglobin 13.0 - 17.0 g/dL 11.5(L) 12.8(L) 14.9  Hematocrit 39.0 - 52.0 % 34.1(L) 38.1(L) 45.2  Platelets 150 - 400 K/uL 535(H) 381 376     RADS: CLINICAL DATA:  Slurred speech. Balance disturbance. Recent cholecystectomy. Abdominal discomfort.  EXAM: CT ABDOMEN AND PELVIS WITHOUT CONTRAST  TECHNIQUE: Multidetector CT imaging of the abdomen and pelvis was performed following the standard protocol without IV contrast.  COMPARISON:  07/09/2020  FINDINGS: Lower chest: Patchy atelectasis in both lower lobes. No consolidation or lobar collapse.  Hepatobiliary: Large abscess in the gallbladder fossa, measuring over 10 cm in diameter. Air bubbles.  Surrounding edematous change in the regional fat.  Pancreas: Normal  Spleen: Normal  Adrenals/Urinary Tract: Adrenal glands are normal. Kidneys are normal. Bladder is normal.  Stomach/Bowel: Stomach and small intestine are normal. No evidence of appendicitis. Evidence of colon obstruction. Diverticulosis without evidence of diverticulitis.  Vascular/Lymphatic: Aortic atherosclerosis. No aneurysm. IVC  is normal. No retroperitoneal adenopathy.  Reproductive: Normal  Other: No free fluid or air.  No evidence of distant abscess.  Musculoskeletal: Advanced lower lumbar degenerative changes.  IMPRESSION: Large abscess in the gallbladder fossa, at least 10 cm in size. No evidence free intraperitoneal fluid or distant abscess/collection.  Atelectasis in both lower lobes.  Aortic Atherosclerosis (ICD10-I70.0).   Electronically Signed   By: Nelson Chimes M.D.   On: 09/25/2020 16:31   Assessment:      Intra-abdominal abscess Elevated LFTs Jaundice History of diabetes   Plan:     CT images personally reviewed by myself and agree with report.  Source of abscess either hematoma versus small biliary leak that became infected.  Biliary leak more likely currently based on LFTs and jaundice appearance.  Patient is status post fluid bolus administration, pressor use, and IV antibiotics.  Recommend continuing supportive care and monitoring in the ICU, while awaiting recommendations from interventional radiology for possible percutaneous abscess drainage.  Once initial abscesses drained adequately, HIDA scan will be ordered to determine whether or not there is a persistent biliary leak.  Depending on HIDA scan results, GI consult may be warranted.  With the recent complicated surgical history, percutaneous drainage by interventional radiology will likely be safer and the quickest way to adequately drain this abscess versus a approach in the operating room.  Interventional radiology has been contacted and currently awaiting callback.  Plan discussed with ICU attending and he is in agreement as well.  Initial consult notification received at 4:56 PM, initial consultation completed by 5:40 PM.

## 2020-09-25 NOTE — ED Provider Notes (Signed)
[facility] Emergency Department Provider Note   ____________________________________________   Event Date/Time   First MD Initiated Contact with Patient 09/25/20 1501     (approximate)  I have reviewed the triage vital signs and the nursing notes.   HISTORY  Chief Complaint Aphasia and Jaundice    HPI Richard Oconnell is a 65 y.o. male with past medical history of hypertension, diabetes, and bladder cancer who presents to the ED for altered mental status.  History is limited due to patient's somnolence and confusion.  Per brother, patient started seeming disoriented the day before yesterday, after which his skin has begun to appear increasingly yellow.  He has complained of some discomfort in the right upper quadrant of his abdomen, recently had cholecystectomy along with significant lysis of adhesions on 4/25 here at [facility].  He had been doing well since surgery up until 2 days ago, initially had JP drain that has since been removed on 5/3.  He has not had any fevers, nausea, vomiting, chest pain, shortness of breath, dysuria, or changes in bowel movements.        Past Medical History:  Diagnosis Date  . Cancer [facility])    Bladder Cancer  . Erectile dysfunction associated with type 2 diabetes mellitus ([place]) 05/26/2016  . Gross hematuria 11/16/2016  . Hypertension 06/11/2015  . Type 2 diabetes mellitus with diabetic polyneuropathy, without long-term current use of insulin ([place]) 06/11/2015    Patient Active Problem List   Diagnosis Date Noted  . S/P laparoscopic cholecystectomy 09/08/2020  . Metabolic syndrome 37/62/8315  . Class 1 obesity due to excess calories with serious comorbidity and body mass index (BMI) of 33.0 to 33.9 in adult 07/06/2019  . Malignant neoplasm of urinary bladder ([place]) 12/06/2018  . Overweight (BMI 25.0-29.9) 07/04/2018  . Hyperlipidemia associated with type 2 diabetes mellitus ([place]) 11/30/2016  . 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 Gross hematuria 11/16/2016  . Erectile dysfunction associated with type 2 diabetes mellitus ([place]) 05/26/2016  . Diabetes mellitus with coincident hypertension ([place]) 06/11/2015  . Type 2 diabetes mellitus with diabetic polyneuropathy, without long-term current use of insulin ([place]) 06/11/2015    Past Surgical History:  Procedure Laterality Date  . boil lanceted     hand  . COLONOSCOPY WITH PROPOFOL N/A 07/03/2020   Procedure: COLONOSCOPY WITH PROPOFOL;  Surgeon: Jonathon Bellows, MD;  Location[facility] ENDOSCOPY;  Service: Gastroenterology;  Laterality: N/A;  . CYSTOSCOPY W/ RETROGRADES Bilateral 12/06/2016   Procedure: CYSTOSCOPY WITH RETROGRADE PYELOGRAM;  Surgeon: Hollice Espy, MD;  Location: [facility] ORS;  Service: Urology;  Laterality: Bilateral;  . CYSTOSCOPY W/ RETROGRADES Bilateral 06/27/2017   Procedure: CYSTOSCOPY WITH RETROGRADE PYELOGRAM;  Surgeon: Hollice Espy, MD;  Location: [facility] ORS;  Service: Urology;  Laterality: Bilateral;  . CYSTOSCOPY WITH BIOPSY N/A 06/27/2017   Procedure: CYSTOSCOPY WITH Bladder BIOPSY;  Surgeon: Hollice Espy, MD;  Location: [facility] ORS;  Service: Urology;  Laterality: N/A;  . CYSTOSCOPY WITH BIOPSY N/A 10/24/2017   Procedure: CYSTOSCOPY WITH Bladder BIOPSY;  Surgeon: Hollice Espy, MD;  Location: [facility] ORS;  Service: Urology;  Laterality: N/A;  . ingrown  Bilateral    ingrown toenail  . IR BILIARY DRAIN PLACEMENT WITH CHOLANGIOGRAM  07/10/2020  . IR CHOLANGIOGRAM EXISTING TUBE  08/07/2020  . IR RADIOLOGIST EVAL & MGMT  08/07/2020  . TRANSURETHRAL RESECTION OF BLADDER TUMOR N/A 12/06/2016   Procedure: TRANSURETHRAL RESECTION OF BLADDER TUMOR (TURBT) (2-5cm) CLOT EVACUATION;  Surgeon: Hollice Espy, MD;  Location: [facility] ORS;  Service: Urology;  Laterality: N/A;  . TRANSURETHRAL RESECTION OF BLADDER TUMOR N/A 06/27/2017   Procedure: TRANSURETHRAL RESECTION OF BLADDER TUMOR (TURBT);  Surgeon: Hollice Espy, MD;  Location: [facility] ORS;  Service: Urology;  Laterality:  N/A;  . TRANSURETHRAL RESECTION OF BLADDER TUMOR WITH MITOMYCIN-C N/A 01/18/2017   Procedure: TRANSURETHRAL RESECTION OF BLADDER TUMOR WITH MITOMYCIN-C-(SMALL);  Surgeon: Hollice Espy, MD;  Location: [facility] ORS;  Service: Urology;  Laterality: N/A;    Prior to Admission medications   Medication Sig Start Date End Date Taking? Authorizing Provider  acetaminophen (TYLENOL) 325 MG tablet Take 1 tablet (325 mg total) by mouth every 6 (six) hours as needed for mild pain, fever or headache (or Fever >/= 101). Patient not taking: Reported on 09/19/2020 07/12/20   Cherene Altes, MD  diphenhydramine-acetaminophen (TYLENOL PM) 25-500 MG TABS tablet Take 1 tablet by mouth at bedtime as needed. Patient not taking: Reported on 09/19/2020    [provider]  ibuprofen (ADVIL) 400 MG tablet Take 1 tablet (400 mg total) by mouth every 6 (six) hours as needed. 09/09/20   Tylene Fantasia, PA-C  metoprolol tartrate (LOPRESSOR) 25 MG tablet Take 0.5 tablets (12.5 mg total) by mouth 2 (two) times daily. 09/19/20   Karamalegos, Devonne Doughty, DO  oxyCODONE (OXY IR/ROXICODONE) 5 MG immediate release tablet Take 1 tablet (5 mg total) by mouth every 6 (six) hours as needed for severe pain or breakthrough pain. 09/09/20   Tylene Fantasia, PA-C  OZEMPIC, 0.25 OR 0.5 MG/DOSE, 2 MG/1.5ML SOPN Inject 1 mg into the skin once a week. Patient taking differently: Inject 1 mg into the skin every Thursday. 05/30/20   Karamalegos, Devonne Doughty, DO  pravastatin (PRAVACHOL) 20 MG tablet Take 1 tablet (20 mg total) by mouth daily. 05/30/20   Karamalegos, Devonne Doughty, DO  zolpidem (AMBIEN) 5 MG tablet Take 1 tablet (5 mg total) by mouth at bedtime as needed for sleep. 08/19/20   [name], DO    Allergies Cyclobenzaprine and Lisinopril  Family History  Problem Relation Age of Onset  . Heart attack Mother   . Heart disease Mother   . Parkinson's disease Father   . Heart attack Maternal Grandfather   . Heart  disease Maternal Grandfather   . Heart attack Maternal Aunt   . Heart attack Maternal Grandmother   . Prostate cancer Neg Hx   . Bladder Cancer Neg Hx   . Kidney cancer Neg Hx     Social History Social History   Tobacco Use  . Smoking status: Never Smoker  . Smokeless tobacco: Never Used  . Tobacco comment: once a while had one cigar >10 years ago  Vaping Use  . Vaping Use: Never used  Substance Use Topics  . Alcohol use: No    Alcohol/week: 0.0 standard drinks  . Drug use: No    Review of Systems  Constitutional: No fever/chills.  Positive for confusion. Eyes: No visual changes. ENT: No sore throat. Cardiovascular: Denies chest pain. Respiratory: Denies shortness of breath. Gastrointestinal: Positive for abdominal pain.  No nausea, no vomiting.  No diarrhea.  No constipation. Genitourinary: Negative for dysuria. Musculoskeletal: Negative for back pain. Skin: Negative for rash.  Positive for jaundice. Neurological: Negative for headaches, focal weakness or numbness.  ____________________________________________   PHYSICAL EXAM:  VITAL SIGNS: ED Triage Vitals  Enc Vitals Group     BP 09/25/20 1436 (!) 75/52     Pulse Rate 09/25/20 1436 (!) 101  Resp 09/25/20 1436 18     Temp 09/25/20 1436 98.3 F (36.8 C)     Temp Source 09/25/20 1436 Oral     SpO2 09/25/20 1436 92 %     Weight 09/25/20 1438 249 lb 1.9 oz (113 kg)     Height 09/25/20 1438 6\' 4"  (1.93 m)     Head Circumference --      Peak Flow --      Pain Score 09/25/20 1438 4     Pain Loc --      Pain Edu? --      Excl. in [place]? --     Constitutional: Somnolent but arousable to voice, oriented to person and place. Eyes: Conjunctivae are normal. Head: Atraumatic. Nose: No congestion/rhinnorhea. Mouth/Throat: Mucous membranes are very dry. Neck: Normal ROM Cardiovascular: Normal rate, regular rhythm. Grossly normal heart sounds. Respiratory: Normal respiratory effort.  No retractions. Lungs  CTAB. Gastrointestinal: Soft and tender to palpation in the right upper quadrant with hepatomegaly, no rebound or guarding. No distention. Genitourinary: deferred Musculoskeletal: No lower extremity tenderness nor edema. Neurologic: Slurred speech noted.. No gross focal neurologic deficits are appreciated. Skin:  Skin is warm, dry and intact. No rash noted.  Jaundiced appearing. Psychiatric: Mood and affect are normal. Speech and behavior are normal.  ____________________________________________   LABS (all labs ordered are listed, but only abnormal results are displayed)  Labs Reviewed  PROTIME-INR - Abnormal; Notable for the following components:      Result Value   Prothrombin Time 17.7 (*)    INR 1.5 (*)    All other components within normal limits  APTT - Abnormal; Notable for the following components:   aPTT 40 (*)    All other components within normal limits  CBC - Abnormal; Notable for the following components:   WBC 25.5 (*)    Hemoglobin 11.5 (*)    HCT 34.1 (*)    Platelets 535 (*)    All other components within normal limits  DIFFERENTIAL - Abnormal; Notable for the following components:   Neutro Abs 24.0 (*)    Lymphs Abs 0.5 (*)    Abs Immature Granulocytes 0.28 (*)    All other components within normal limits  COMPREHENSIVE METABOLIC PANEL - Abnormal; Notable for the following components:   Potassium 3.1 (*)    Chloride 96 (*)    CO2 17 (*)    BUN 46 (*)    Creatinine, Ser 4.89 (*)    Calcium 8.5 (*)    Total Protein 8.2 (*)    Albumin 2.6 (*)    AST 121 (*)    ALT 56 (*)    Alkaline Phosphatase 459 (*)    Total Bilirubin 9.5 (*)    GFR, Estimated 12 (*)    Anion gap 22 (*)    All other components within normal limits  LACTIC ACID, PLASMA - Abnormal; Notable for the following components:   Lactic Acid, Venous 6.5 (*)    All other components within normal limits  RESP PANEL BY RT-PCR (FLU A&B, COVID) ARPGX2  CULTURE, BLOOD (ROUTINE X 2)  CULTURE,  BLOOD (ROUTINE X 2)  URINE CULTURE  AMMONIA  LACTIC ACID, PLASMA  URINALYSIS, COMPLETE (UACMP) WITH MICROSCOPIC  CBG MONITORING, ED   ____________________________________________  EKG  ED ECG REPORT I, Blake Divine, the attending physician, personally viewed and interpreted this ECG.   Date: 09/25/2020  EKG Time: 15:19  Rate: 100  Rhythm: sinus tachycardia  Axis: LAD  Intervals:left bundle  branch block  ST&T Change: None   PROCEDURES  Procedure(s) performed (including Critical Care):  .Critical Care Performed by: Blake Divine, MD Authorized by: Blake Divine, MD   Critical care provider statement:    Critical care time (minutes):  45   Critical care time was exclusive of:  Separately billable procedures and treating other patients and teaching time   Critical care was necessary to treat or prevent imminent or life-threatening deterioration of the following conditions:  Sepsis   Critical care was time spent personally by me on the following activities:  Discussions with consultants, evaluation of patient's response to treatment, examination of patient, ordering and performing treatments and interventions, ordering and review of laboratory studies, ordering and review of radiographic studies, pulse oximetry, re-evaluation of patient's condition, obtaining history from patient or surrogate and review of old charts   I assumed direction of critical care for this patient from another provider in my specialty: no     Care discussed with: admitting provider       ____________________________________________   INITIAL IMPRESSION / ASSESSMENT AND PLAN / ED COURSE       65 year old male with past medical history of hypertension, diabetes, bladder cancer, and recent cholecystectomy on April 25 who presents to the ED for altered mental status and jaundice developing about 3 days ago.  On arrival, patient is disoriented and noted to be hypotensive, presentation concerning  for sepsis and initial labs show leukocytosis.  We will initiate aggressive IV fluid hydration, although blood pressure does not seem to be improving following initial liter of fluids and we will start patient on Levophed.  He did have a recent complicated course of cholecystitis, initially requiring cholecystostomy drain and subsequently requiring cholecystectomy with significant lysis of adhesions.  We will further assess with CT scan for abscess versus cholangitis.  He appears severely dehydrated, does have significant AKI and bilirubinemia on labs.  CT head is negative for acute process and I suspect his altered mentation is secondary to sepsis.  He does appear slightly more awake and alert following IV fluid rehydration and pressor support.  CT abdomen/pelvis is significant for approximately 10 cm abscess in his gallbladder fossa.  This finding was discussed with Dr. Lysle Pearl of general surgery, who will further discuss potential drain placement with IR.  He is also concerned for bile leak given elevated bilirubin and alkaline phosphatase.  Case discussed with Dr. Mortimer Fries in the ICU for admission.      ____________________________________________   FINAL CLINICAL IMPRESSION(S) / ED DIAGNOSES  Final diagnoses:  Altered mental status, unspecified altered mental status type  Sepsis with acute renal failure and septic shock, due to unspecified organism, unspecified acute renal failure type [facility])  Postprocedural intraabdominal abscess     ED Discharge Orders    None       Note:  This document was prepared using Dragon voice recognition software and may include unintentional dictation errors.   Blake Divine, MD 09/25/20 [phone]

## 2020-09-25 NOTE — ED Notes (Signed)
ED Provider at bedside. 

## 2020-09-25 NOTE — Procedures (Signed)
Interventional Radiology Procedure Note  Procedure: CT guided placement of a 59F drain into the GB fossa abscess.  Aspiration yields 200 mL purulent fluid.   Complications: None  Estimated Blood Loss: None  Recommendations: - Cultures sent - Flush drain Q shift - Follow drain output   Signed,  Criselda Peaches, MD

## 2020-09-25 NOTE — ED Notes (Signed)
Critical lab  Lactic acid 6.5  MD. Charna Archer advised

## 2020-09-25 NOTE — ED Notes (Addendum)
Surgeon  and Pharmacy tech at bedside

## 2020-09-25 NOTE — H&P (Signed)
NAME:  Richard Oconnell, MRN:  [id number], DOB:  14-Nov-1955, LOS: 0 ADMISSION DATE:  09/25/2020 REFERRING MD:  Charna Archer CHIEF COMPLAINT:  Feeling bad and belly pain   History of Present Illness:  65 year old man past medical history of hypertension, diabetes, and bladder cancer who was hospitalized with acute cholecystitis-several months ago 2/24 He was temporized with a percutaneous cholecystostomy tube.     Follow up  cholangiogram via the tube demonstrates that the cystic duct is still obstructed He was offered him a robot-assisted laparoscopic cholecystectomy, but  deferred this procedure  On 4/25 recently had cholecystectomy along with significant lysis of adhesions on 4/25 here at [facility].   He presents today with somnolence and confusion.   Family reports that patient is disoriented the day before yesterday, after which his skin has begun to appear increasingly yellow.    +discomfort in the right upper quadrant of his abdomen, liver failure, renal failure, severe acidosis Patient started on pressors, admitted for septic shock  WBC 25 LA 6.5-->8.7 CREAT 4.8 TB 9.5   Pertinent  Medical History  2/24 admitted for acute cholecystitis,percutaneous cholecystostomy tube placed by IR  3/29 offered  a robot-assisted laparoscopic cholecystectomy, but deferred  4/25 s/p cholecystectomy along with significant lysis of adhesions on 4/25 here at [facility].   Significant Hospital Events: Including procedures, antibiotic start and stop dates in addition to other pertinent events   2/24 admitted for acute cholecystitis,percutaneous cholecystostomy tube placed by IR  3/29 offered  a robot-assisted laparoscopic cholecystectomy, but deferred  4/25 s/p cholecystectomy along with significant lysis of adhesions on 4/25 here at [facility]. 5/12 admission to ICU for large abd abscess, jaundice and septic shock, on pressors 5/12 CT abd-Large abscess in the gallbladder fossa, at least 10 cm in  size     Micro Data:  COVID NEG INF AB NEG HIVE NEG 2/22   Antimicrobials:   Antibiotics Given (last 72 hours)    Date/Time Action Medication Dose Rate   09/25/20 1506 New Bag/Given   piperacillin-tazobactam (ZOSYN) IVPB 3.375 g 3.375 g 100 mL/hr          Objective   Blood pressure (!) 84/56, pulse 92, temperature 98.3 F (36.8 C), temperature source Oral, resp. rate 20, height 6\' 4"  (1.93 m), weight 113 kg, SpO2 100 %.        Intake/Output Summary (Last 24 hours) at 09/25/2020 1744 Last data filed at 09/25/2020 1732 Gross per 24 hour  Intake 3000 ml  Output --  Net 3000 ml   Filed Weights   09/25/20 1438  Weight: 113 kg      REVIEW OF SYSTEMS  PATIENT IS UNABLE TO PROVIDE COMPLETE REVIEW OF SYSTEMS DUE TO SEVERE CRITICAL ILLNESS AND TOXIC METABOLIC ENCEPHALOPATHY   PHYSICAL EXAMINATION:  GENERAL:critically ill appearing, +Jaundice HEAD: Normocephalic, atraumatic.  EYES: Pupils equal, round, reactive to light.  No scleral icterus.  MOUTH: Moist mucosal membrane. NECK: Supple.  PULMONARY: +rhonchi, CARDIOVASCULAR: S1 and S2. Regular rate and rhythm. No murmurs, rubs, or gallops.  GASTROINTESTINAL: +tender,+distended. minimal bowel sounds.  MUSCULOSKELETAL: No swelling, clubbing, or edema.  NEUROLOGIC: more awake, slow speech SKIN:intact,warm,dry   Labs/imaging that I havepersonally reviewed  (right click and "Reselect all SmartList Selections" daily)       ASSESSMENT AND PLAN SYNOPSIS  65 yo male with previous dx of acute cholecystitis s/p cholecystotomy tube 2/24 and then follow up robotic assisted cholecystectomy 4/25 now presents with large abdominal abscess with severe septic shock with multiorgan failure (liver  and renal failure) complicated by severe metabolic acidosis and toxic metabolic encephalopathy   SEVER AND ACUTE SEPTIC SHOCK -use vasopressors to keep MAP>65  -follow ABG and LA -follow up cultures -emperic ABX -consider  stress dose steroids -aggressive IV fluid resuscitation  CARDIAC ICU monitoring   ACUTE KIDNEY INJURY/Renal Failure -continue Foley Catheter-assess need -Avoid nephrotoxic agents -Follow urine output, BMP -Ensure adequate renal perfusion, optimize oxygenation -Renal dose medications   NEUROLOGY Acute toxic metabolic encephalopathy High risk for intubation    INFECTIOUS DISEASE -continue antibiotics as prescribed -follow up cultures   ENDO - ICU hypoglycemic\Hyperglycemia protocol -check FSBS per protocol   GI LARGE ABD ABSCESS GENERAL SURGERY AND IR CONSULTED RECOMMEND SOURCE CONTROL AND TUBE PLACEMENT GI PROPHYLAXIS as indicated  NUTRITIONAL STATUS DIET-->NPO Constipation protocol as indicated   ELECTROLYTES -follow labs as needed -replace as needed -pharmacy consultation and following     Best practice (right click and "Reselect all SmartList Selections" daily)  Diet: NPO Pain/Anxiety/Delirium protocol (if indicated): NA VAP protocol (if indicated): NA DVT prophylaxis: SCD's GI prophylaxis: PPI Glucose control:  SSI No Central venous access:  N/A Arterial line:  N/A Foley:  Yes, and it is still needed Mobility:  bed rest  PT consulted: N/A  Code Status:  full code Disposition: ICU  Labs   CBC: Recent Labs  Lab 09/25/20 1444  WBC 25.5*  NEUTROABS 24.0*  HGB 11.5*  HCT 34.1*  MCV 80.4  PLT 535*    Basic Metabolic Panel: Recent Labs  Lab 09/25/20 1444  NA 135  K 3.1*  CL 96*  CO2 17*  GLUCOSE 84  BUN 46*  CREATININE 4.89*  CALCIUM 8.5*   GFR: Estimated Creatinine Clearance: 20.7 mL/min (A) (by C-G formula based on SCr of 4.89 mg/dL (H)). Recent Labs  Lab 09/25/20 1444 09/25/20 1502  WBC 25.5*  --   LATICACIDVEN  --  8.7*  6.5*    Liver Function Tests: Recent Labs  Lab 09/25/20 1444  AST 121*  ALT 56*  ALKPHOS 459*  BILITOT 9.5*  PROT 8.2*  ALBUMIN 2.6*   No results for input(s): LIPASE, AMYLASE in the last  168 hours. Recent Labs  Lab 09/25/20 1502  AMMONIA 19    ABG No results found for: PHART, PCO2ART, PO2ART, HCO3, TCO2, ACIDBASEDEF, O2SAT   Coagulation Profile: Recent Labs  Lab 09/25/20 1444  INR 1.5*    Cardiac Enzymes: No results for input(s): CKTOTAL, CKMB, CKMBINDEX, TROPONINI in the last 168 hours.  HbA1C: Hemoglobin A1C  Date/Time Value Ref Range Status  12/27/2019 12:00 AM 6.9  Final    Comment:    [facility]  03/09/2019 11:22 AM 5.6 4.0 - 5.6 % Final  12/06/2018 10:26 AM 6.7 (A) 4.0 - 5.6 % Final   Hgb A1c MFr Bld  Date/Time Value Ref Range Status  05/30/2020 10:47 AM 7.2 (H) <5.7 % of total Hgb Final    Comment:    For someone without known diabetes, a hemoglobin A1c value of 6.5% or greater indicates that they may have  diabetes and this should be confirmed with a follow-up  test. . For someone with known diabetes, a value <7% indicates  that their diabetes is well controlled and a value  greater than or equal to 7% indicates suboptimal  control. A1c targets should be individualized based on  duration of diabetes, age, comorbid conditions, and  other considerations. . Currently, no consensus exists regarding use of hemoglobin A1c for diagnosis of diabetes for children. Marland Kitchen  11/24/2016 08:18 AM 6.4 (H) <5.7 % Final    Comment:      For someone without known diabetes, a hemoglobin A1c value between 5.7% and 6.4% is consistent with prediabetes and should be confirmed with a follow-up test.   For someone with known diabetes, a value <7% indicates that their diabetes is well controlled. A1c targets should be individualized based on duration of diabetes, age, co-morbid conditions and other considerations.   This assay result is consistent with an increased risk of diabetes.   Currently, no consensus exists regarding use of hemoglobin A1c for diagnosis of diabetes in children.       CBG: Recent Labs  Lab 09/25/20 1439  GLUCAP 88      Past Medical History:  He,  has a past medical history of Cancer [facility]), Erectile dysfunction associated with type 2 diabetes mellitus ([place]) (05/26/2016), Gross hematuria (11/16/2016), Hypertension (06/11/2015), and Type 2 diabetes mellitus with diabetic polyneuropathy, without long-term current use of insulin ([place]) (06/11/2015).   Surgical History:   Past Surgical History:  Procedure Laterality Date  . boil lanceted     hand  . COLONOSCOPY WITH PROPOFOL N/A 07/03/2020   Procedure: COLONOSCOPY WITH PROPOFOL;  Surgeon: Jonathon Bellows, MD;  Location: [facility] ENDOSCOPY;  Service: Gastroenterology;  Laterality: N/A;  . CYSTOSCOPY W/ RETROGRADES Bilateral 12/06/2016   Procedure: CYSTOSCOPY WITH RETROGRADE PYELOGRAM;  Surgeon: Hollice Espy, MD;  Location: [facility] ORS;  Service: Urology;  Laterality: Bilateral;  . CYSTOSCOPY W/ RETROGRADES Bilateral 06/27/2017   Procedure: CYSTOSCOPY WITH RETROGRADE PYELOGRAM;  Surgeon: Hollice Espy, MD;  Location: [facility] ORS;  Service: Urology;  Laterality: Bilateral;  . CYSTOSCOPY WITH BIOPSY N/A 06/27/2017   Procedure: CYSTOSCOPY WITH Bladder BIOPSY;  Surgeon: Hollice Espy, MD;  Location: [facility] ORS;  Service: Urology;  Laterality: N/A;  . CYSTOSCOPY WITH BIOPSY N/A 10/24/2017   Procedure: CYSTOSCOPY WITH Bladder BIOPSY;  Surgeon: Hollice Espy, MD;  Location: [facility] ORS;  Service: Urology;  Laterality: N/A;  . ingrown  Bilateral    ingrown toenail  . IR BILIARY DRAIN PLACEMENT WITH CHOLANGIOGRAM  07/10/2020  . IR CHOLANGIOGRAM EXISTING TUBE  08/07/2020  . IR RADIOLOGIST EVAL & MGMT  08/07/2020  . TRANSURETHRAL RESECTION OF BLADDER TUMOR N/A 12/06/2016   Procedure: TRANSURETHRAL RESECTION OF BLADDER TUMOR (TURBT) (2-5cm) CLOT EVACUATION;  Surgeon: Hollice Espy, MD;  Location: [facility] ORS;  Service: Urology;  Laterality: N/A;  . TRANSURETHRAL RESECTION OF BLADDER TUMOR N/A 06/27/2017   Procedure: TRANSURETHRAL RESECTION OF BLADDER TUMOR (TURBT);  Surgeon: Hollice Espy,  MD;  Location: [facility] ORS;  Service: Urology;  Laterality: N/A;  . TRANSURETHRAL RESECTION OF BLADDER TUMOR WITH MITOMYCIN-C N/A 01/18/2017   Procedure: TRANSURETHRAL RESECTION OF BLADDER TUMOR WITH MITOMYCIN-C-(SMALL);  Surgeon: Hollice Espy, MD;  Location: [facility] ORS;  Service: Urology;  Laterality: N/A;     Social History:   reports that he has never smoked. He has never used smokeless tobacco. He reports that he does not drink alcohol and does not use drugs.   Family History:  His family history includes Heart attack in his maternal aunt, maternal grandfather, maternal grandmother, and mother; Heart disease in his maternal grandfather and mother; Parkinson's disease in his father. There is no history of Prostate cancer, Bladder Cancer, or Kidney cancer.   Allergies Allergies  Allergen Reactions  . Cyclobenzaprine Other (See Comments)    Not improving pain and makes him agressive.  . Lisinopril Cough     Home Medications  Prior to Admission medications   Medication  Sig Start Date End Date Taking? Authorizing Provider  ibuprofen (ADVIL) 400 MG tablet Take 1 tablet (400 mg total) by mouth every 6 (six) hours as needed. 09/09/20  Yes Edison Simon R, PA-C  metoprolol tartrate (LOPRESSOR) 25 MG tablet Take 0.5 tablets (12.5 mg total) by mouth 2 (two) times daily. 09/19/20  Yes Karamalegos, Devonne Doughty, DO  oxyCODONE (OXY IR/ROXICODONE) 5 MG immediate release tablet Take 1 tablet (5 mg total) by mouth every 6 (six) hours as needed for severe pain or breakthrough pain. 09/09/20  Yes Edison Simon R, PA-C  pravastatin (PRAVACHOL) 20 MG tablet Take 1 tablet (20 mg total) by mouth daily. 05/30/20  Yes Karamalegos, Devonne Doughty, DO  zolpidem (AMBIEN) 5 MG tablet Take 1 tablet (5 mg total) by mouth at bedtime as needed for sleep. 08/19/20  Yes Karamalegos, Devonne Doughty, DO  acetaminophen (TYLENOL) 325 MG tablet Take 1 tablet (325 mg total) by mouth every 6 (six) hours as needed for mild pain, fever or  headache (or Fever >/= 101). Patient not taking: No sig reported 07/12/20   Cherene Altes, MD  diphenhydramine-acetaminophen (TYLENOL PM) 25-500 MG TABS tablet Take 1 tablet by mouth at bedtime as needed. Patient not taking: No sig reported    [provider]  OZEMPIC, 0.25 OR 0.5 MG/DOSE, 2 MG/1.5ML SOPN Inject 1 mg into the skin once a week. Patient taking differently: Inject 1 mg into the skin every Thursday. 05/30/20   Karamalegos, Devonne Doughty, DO       DVT/GI PRX  assessed I Assessed the need for Labs I Assessed the need for Foley I Assessed the need for Central Venous Line Family Discussion when available I Assessed the need for Mobilization I made an Assessment of medications to be adjusted accordingly Safety Risk assessment completed  CASE DISCUSSED IN MULTIDISCIPLINARY ROUNDS WITH ICU TEAM     Critical Care Time devoted to patient care services described in this note is 65 minutes.  Critical care was necessary to treat /prevent imminent and life-threatening deterioration. Overall, patient is critically ill, prognosis is guarded.  Patient with Multiorgan failure and at high risk for cardiac arrest and death.    Corrin Parker, M.D.  [facility]  Medical Director [employer] Director [facility] Cardio-Pulmonary Department

## 2020-09-25 NOTE — ED Notes (Signed)
ICU #3

## 2020-09-25 NOTE — ED Notes (Signed)
ED Nurse taking pt up

## 2020-09-25 NOTE — Progress Notes (Signed)
Notified provider of need to order repeat lactic acid (#3) @ 1900. Bedside RN aware.

## 2020-09-25 NOTE — ED Triage Notes (Addendum)
Pt comes into the ED via POV c/o slurred speech, change in balance (stumbling), and acting differently then baseline.  Pt had gallbladder removed a couple weeks ago and has been released from general surgery and been cleared.  Brother went to go check on the patient about 2 days ago and noticed that his balance was off and his speech had changed. Last known normal per the family was 3-4 days ago. Pt presents jaundice and lethargic.  PT and family explain his gallbladder removal was complicated with a lot of secondary infection around it.

## 2020-09-25 NOTE — Progress Notes (Signed)
Notified bedside nurse of need to draw repeat lactic acid (#3) ICU RN.

## 2020-09-25 NOTE — Progress Notes (Signed)
Pharmacy Antibiotic Note  Richard Oconnell is a 65 y.o. male admitted on 09/25/2020. Pharmacy has been consulted for Zosyn dosing.  Plan: Zoysn 3.375 g IV q8h extended infusion. If renal function declines, will plan to adjust dose tomorrow.  Height: 6\' 4"  (193 cm) Weight: 109.5 kg (241 lb 6.5 oz) IBW/kg (Calculated) : 86.8  Temp (24hrs), Avg:98.7 F (37.1 C), Min:98.3 F (36.8 C), Max:99 F (37.2 C)  Recent Labs  Lab 09/25/20 1444 09/25/20 1502 09/25/20 1905  WBC 25.5*  --   --   CREATININE 4.89*  --   --   LATICACIDVEN  --  8.7*  6.5* 4.4*    Estimated Creatinine Clearance: 20.4 mL/min (A) (by C-G formula based on SCr of 4.89 mg/dL (H)).    Allergies  Allergen Reactions  . Cyclobenzaprine Other (See Comments)    Not improving pain and makes him agressive.  . Lisinopril Cough    Antimicrobials this admission: Zosyn 5/12 >>   Microbiology results: 5/12 BCx: pending 5/12 Cx (abscess): pending 5/12 MRSA PCR: negative  Thank you for allowing pharmacy to be a part of this patient's care.  Tawnya Crook, PharmD 09/25/2020 9:26 PM

## 2020-09-25 NOTE — Consult Note (Signed)
CODE SEPSIS - PHARMACY COMMUNICATION  **Broad Spectrum Antibiotics should be administered within 1 hour of Sepsis diagnosis**  Time Code Sepsis Called/Page Received: 1527  Antibiotics Ordered: zosyn  Time of 1st antibiotic administration: 2297  Additional action taken by pharmacy: none  If necessary, Name of Provider/Nurse Contacted: n/a    Pearla Dubonnet ,PharmD Clinical Pharmacist  09/25/2020  3:28 PM

## 2020-09-25 NOTE — Progress Notes (Signed)
Notified bedside nurse of need to administer fluid bolus.  

## 2020-09-25 NOTE — Progress Notes (Signed)
Elink following for code sepsis, notified provider pt requires 3390 cc fluid per protocol

## 2020-09-25 NOTE — ED Notes (Signed)
Sent message to Floor Nurse

## 2020-09-25 NOTE — Progress Notes (Signed)
1851 Patient admitted to ICU room 3 from ED. 7 healed puncture wounds noted on abdomen in various stages of healing. Talkative and confused on date and time.

## 2020-09-25 NOTE — ED Notes (Signed)
Patient transported to CT 

## 2020-09-25 NOTE — Progress Notes (Signed)
Notified provider of need to order repeat lactic acid, bedside RN notified as well.

## 2020-09-26 ENCOUNTER — Inpatient Hospital Stay: Payer: Medicare Other

## 2020-09-26 DIAGNOSIS — K6819 Other retroperitoneal abscess: Secondary | ICD-10-CM | POA: Diagnosis not present

## 2020-09-26 DIAGNOSIS — K9189 Other postprocedural complications and disorders of digestive system: Secondary | ICD-10-CM | POA: Diagnosis not present

## 2020-09-26 DIAGNOSIS — A4181 Sepsis due to Enterococcus: Secondary | ICD-10-CM

## 2020-09-26 DIAGNOSIS — N179 Acute kidney failure, unspecified: Secondary | ICD-10-CM | POA: Diagnosis not present

## 2020-09-26 DIAGNOSIS — A419 Sepsis, unspecified organism: Secondary | ICD-10-CM | POA: Diagnosis not present

## 2020-09-26 DIAGNOSIS — R6521 Severe sepsis with septic shock: Secondary | ICD-10-CM | POA: Diagnosis not present

## 2020-09-26 DIAGNOSIS — R4182 Altered mental status, unspecified: Secondary | ICD-10-CM | POA: Diagnosis not present

## 2020-09-26 LAB — BASIC METABOLIC PANEL
Anion gap: 20 — ABNORMAL HIGH (ref 5–15)
BUN: 51 mg/dL — ABNORMAL HIGH (ref 8–23)
CO2: 17 mmol/L — ABNORMAL LOW (ref 22–32)
Calcium: 7.9 mg/dL — ABNORMAL LOW (ref 8.9–10.3)
Chloride: 98 mmol/L (ref 98–111)
Creatinine, Ser: 4.76 mg/dL — ABNORMAL HIGH (ref 0.61–1.24)
GFR, Estimated: 13 mL/min — ABNORMAL LOW (ref >60)
Glucose, Bld: 148 mg/dL — ABNORMAL HIGH (ref 70–99)
Potassium: 3.9 mmol/L (ref 3.5–5.1)
Sodium: 135 mmol/L (ref 135–145)

## 2020-09-26 LAB — URINALYSIS, ROUTINE W REFLEX MICROSCOPIC: Specific Gravity, Urine: 1.02 (ref 1.005–1.030)

## 2020-09-26 LAB — BLOOD CULTURE ID PANEL (REFLEXED) - BCID2
A.calcoaceticus-baumannii: NOT DETECTED
Bacteroides fragilis: DETECTED — AB
Candida albicans: NOT DETECTED
Candida auris: NOT DETECTED
Candida glabrata: NOT DETECTED
Candida krusei: NOT DETECTED
Candida parapsilosis: NOT DETECTED
Candida tropicalis: NOT DETECTED
Cryptococcus neoformans/gattii: NOT DETECTED
Enterobacter cloacae complex: NOT DETECTED
Enterobacterales: NOT DETECTED
Enterococcus Faecium: NOT DETECTED
Enterococcus faecalis: DETECTED — AB
Escherichia coli: NOT DETECTED
Haemophilus influenzae: NOT DETECTED
Klebsiella aerogenes: NOT DETECTED
Klebsiella oxytoca: NOT DETECTED
Klebsiella pneumoniae: NOT DETECTED
Listeria monocytogenes: NOT DETECTED
Neisseria meningitidis: NOT DETECTED
Proteus species: NOT DETECTED
Pseudomonas aeruginosa: NOT DETECTED
Salmonella species: NOT DETECTED
Serratia marcescens: NOT DETECTED
Staphylococcus aureus (BCID): NOT DETECTED
Staphylococcus epidermidis: NOT DETECTED
Staphylococcus lugdunensis: NOT DETECTED
Staphylococcus species: NOT DETECTED
Stenotrophomonas maltophilia: NOT DETECTED
Streptococcus agalactiae: NOT DETECTED
Streptococcus pneumoniae: NOT DETECTED
Streptococcus pyogenes: NOT DETECTED
Streptococcus species: NOT DETECTED
Vancomycin resistance: NOT DETECTED

## 2020-09-26 LAB — HEPATIC FUNCTION PANEL
ALT: 56 U/L — ABNORMAL HIGH (ref 0–44)
AST: 133 U/L — ABNORMAL HIGH (ref 15–41)
Albumin: 2.4 g/dL — ABNORMAL LOW (ref 3.5–5.0)
Alkaline Phosphatase: 360 U/L — ABNORMAL HIGH (ref 38–126)
Bilirubin, Direct: 4.3 mg/dL — ABNORMAL HIGH (ref 0.0–0.2)
Indirect Bilirubin: 2.4 mg/dL — ABNORMAL HIGH (ref 0.3–0.9)
Total Bilirubin: 6.7 mg/dL — ABNORMAL HIGH (ref 0.3–1.2)
Total Protein: 7.6 g/dL (ref 6.5–8.1)

## 2020-09-26 LAB — URINE DRUG SCREEN, QUALITATIVE (ARMC ONLY)
Amphetamines, Ur Screen: NOT DETECTED
Barbiturates, Ur Screen: NOT DETECTED
Benzodiazepine, Ur Scrn: POSITIVE — AB
Cannabinoid 50 Ng, Ur ~~LOC~~: NOT DETECTED
Cocaine Metabolite,Ur ~~LOC~~: NOT DETECTED
MDMA (Ecstasy)Ur Screen: NOT DETECTED
Methadone Scn, Ur: NOT DETECTED
Opiate, Ur Screen: NOT DETECTED
Phencyclidine (PCP) Ur S: NOT DETECTED
Tricyclic, Ur Screen: NOT DETECTED

## 2020-09-26 LAB — CBC
HCT: 31.2 % — ABNORMAL LOW (ref 39.0–52.0)
Hemoglobin: 10.3 g/dL — ABNORMAL LOW (ref 13.0–17.0)
MCH: 27 pg (ref 26.0–34.0)
MCHC: 33 g/dL (ref 30.0–36.0)
MCV: 81.9 fL (ref 80.0–100.0)
Platelets: 524 10*3/uL — ABNORMAL HIGH (ref 150–400)
RBC: 3.81 MIL/uL — ABNORMAL LOW (ref 4.22–5.81)
RDW: 15.1 % (ref 11.5–15.5)
WBC: 45.5 10*3/uL — ABNORMAL HIGH (ref 4.0–10.5)
nRBC: 0 % (ref 0.0–0.2)

## 2020-09-26 LAB — HEPATITIS B SURFACE ANTIGEN: Hepatitis B Surface Ag: NONREACTIVE

## 2020-09-26 LAB — HEPATITIS B CORE ANTIBODY, IGM: Hep B C IgM: NONREACTIVE

## 2020-09-26 LAB — HEMOGLOBIN A1C
Hgb A1c MFr Bld: 6.7 % — ABNORMAL HIGH (ref 4.8–5.6)
Mean Plasma Glucose: 145.59 mg/dL

## 2020-09-26 LAB — HEPATITIS B CORE ANTIBODY, TOTAL: Hep B Core Total Ab: NONREACTIVE

## 2020-09-26 LAB — HEPATITIS B SURFACE ANTIBODY,QUALITATIVE: Hep B S Ab: NONREACTIVE

## 2020-09-26 LAB — MAGNESIUM: Magnesium: 2.4 mg/dL (ref 1.7–2.4)

## 2020-09-26 LAB — PROTEIN / CREATININE RATIO, URINE
Creatinine, Urine: 197 mg/dL
Protein Creatinine Ratio: 0.62 mg/mg{Cre} — ABNORMAL HIGH (ref 0.00–0.15)
Total Protein, Urine: 122 mg/dL

## 2020-09-26 LAB — HEPATITIS C ANTIBODY: HCV Ab: NONREACTIVE

## 2020-09-26 LAB — GLUCOSE, CAPILLARY
Glucose-Capillary: 172 mg/dL — ABNORMAL HIGH (ref 70–99)
Glucose-Capillary: 184 mg/dL — ABNORMAL HIGH (ref 70–99)

## 2020-09-26 LAB — PROCALCITONIN: Procalcitonin: 148.71 ng/mL

## 2020-09-26 LAB — PHOSPHORUS: Phosphorus: 7.5 mg/dL — ABNORMAL HIGH (ref 2.5–4.6)

## 2020-09-26 MED ORDER — ZOLPIDEM TARTRATE 5 MG PO TABS: 5.0000 mg | ORAL_TABLET | Freq: Once | ORAL | Status: DC

## 2020-09-26 MED ORDER — MIDODRINE HCL 5 MG PO TABS: 10.0000 mg | ORAL_TABLET | Freq: Three times a day (TID) | ORAL | Status: DC

## 2020-09-26 MED ORDER — FENTANYL CITRATE (PF) 100 MCG/2ML IJ SOLN
INTRAMUSCULAR | Status: AC
  Administered 2020-09-26: 50 ug via INTRAVENOUS
  Filled 2020-09-26: qty 2

## 2020-09-26 MED ORDER — ZIPRASIDONE MESYLATE 20 MG IM SOLR
20.0000 mg | Freq: Once | INTRAMUSCULAR | Status: DC
  Filled 2020-09-26: qty 20

## 2020-09-26 MED ORDER — INSULIN ASPART 100 UNIT/ML IJ SOLN: 0.0000 [IU] | INTRAMUSCULAR | Status: DC

## 2020-09-26 MED ORDER — ZOLPIDEM TARTRATE 5 MG PO TABS: 5.0000 mg | ORAL_TABLET | Freq: Every day | ORAL | Status: DC

## 2020-09-26 MED ORDER — DEXMEDETOMIDINE HCL IN NACL 400 MCG/100ML IV SOLN
0.4000 ug/kg/h | INTRAVENOUS | Status: DC
  Administered 2020-09-26 (×2): 1.2 ug/kg/h via INTRAVENOUS
  Administered 2020-09-26: 1 ug/kg/h via INTRAVENOUS
  Administered 2020-09-26: 0.5 ug/kg/h via INTRAVENOUS
  Administered 2020-09-26: 1 ug/kg/h via INTRAVENOUS
  Administered 2020-09-27 (×3): 1.2 ug/kg/h via INTRAVENOUS
  Filled 2020-09-26 (×7): qty 100

## 2020-09-26 MED ORDER — INSULIN ASPART 100 UNIT/ML IJ SOLN
0.0000 [IU] | Freq: Three times a day (TID) | INTRAMUSCULAR | Status: DC
  Administered 2020-09-26 (×2): 2 [IU] via SUBCUTANEOUS
  Administered 2020-09-27 (×2): 3 [IU] via SUBCUTANEOUS
  Administered 2020-09-27: 5 [IU] via SUBCUTANEOUS
  Filled 2020-09-26 (×5): qty 1

## 2020-09-26 MED ORDER — RISPERIDONE 1 MG PO TABS
1.0000 mg | ORAL_TABLET | Freq: Once | ORAL | Status: DC
  Filled 2020-09-26: qty 1

## 2020-09-26 MED ORDER — HALOPERIDOL LACTATE 5 MG/ML IJ SOLN
2.0000 mg | Freq: Four times a day (QID) | INTRAMUSCULAR | Status: DC | PRN
  Administered 2020-09-27 – 2020-10-03 (×2): 2 mg via INTRAVENOUS
  Filled 2020-09-26 (×2): qty 1

## 2020-09-26 MED ORDER — INSULIN ASPART 100 UNIT/ML IJ SOLN: 0.0000 [IU] | Freq: Every day | INTRAMUSCULAR | Status: DC

## 2020-09-26 MED ORDER — INSULIN ASPART 100 UNIT/ML IJ SOLN: 0.0000 [IU] | Freq: Three times a day (TID) | INTRAMUSCULAR | Status: DC

## 2020-09-26 MED ORDER — STERILE WATER FOR INJECTION IV SOLN
INTRAVENOUS | Status: DC
  Filled 2020-09-26: qty 1000
  Filled 2020-09-26: qty 150
  Filled 2020-09-26 (×2): qty 1000
  Filled 2020-09-26 (×3): qty 150
  Filled 2020-09-26: qty 1000

## 2020-09-26 MED ORDER — MIDAZOLAM HCL 2 MG/2ML IJ SOLN
INTRAMUSCULAR | Status: AC
  Administered 2020-09-26: 2 mg via INTRAVENOUS
  Filled 2020-09-26: qty 2

## 2020-09-26 MED ORDER — FENTANYL CITRATE (PF) 100 MCG/2ML IJ SOLN
50.0000 ug | INTRAMUSCULAR | Status: DC | PRN
  Administered 2020-09-26 – 2020-10-05 (×3): 50 ug via INTRAVENOUS
  Filled 2020-09-26 (×2): qty 2

## 2020-09-26 MED ORDER — ACETAMINOPHEN 325 MG PO TABS: 650.0000 mg | ORAL_TABLET | ORAL | Status: DC | PRN

## 2020-09-26 MED ORDER — MIDAZOLAM HCL 2 MG/2ML IJ SOLN
2.0000 mg | INTRAMUSCULAR | Status: DC | PRN
  Administered 2020-09-26 – 2020-09-27 (×3): 2 mg via INTRAVENOUS
  Filled 2020-09-26 (×3): qty 2

## 2020-09-26 NOTE — Progress Notes (Signed)
Pharmacy Antibiotic Note  Richard Oconnell is a 65 y.o. male with medical history for diabetes, HTN, and bladder cancer admitted on 09/25/2020. Pharmacy has been consulted for Zosyn dosing.    5/12 CT abd: large abscess in gallbladder fossa. No evidence free intraperitoneal fluid or distant abscess/collection  5/12 BCID 2/4 bottles (one set) with Enterococcus faecalis and Bacteroides fragilis  5/12 Culture from abscess - gram negative coccobacilli  Plan:  Continue Zoysn 3.375 g IV q8h extended infusion.  Pt renal function is borderline for dose change - will continue to monitor and adjust if needed.  Follow up cultures, pt clinical improvement, and de-escalate abx as appropriate.   Height: 6\' 4"  (193 cm) Weight: 107 kg (235 lb 14.3 oz) IBW/kg (Calculated) : 86.8  Temp (24hrs), Avg:98.4 F (36.9 C), Min:98.1 F (36.7 C), Max:99 F (37.2 C)  Recent Labs  Lab 09/25/20 1444 09/25/20 1502 09/25/20 1905 09/25/20 2151 09/26/20 0420  WBC 25.5*  --   --   --  45.5*  CREATININE 4.89*  --   --   --  4.76*  LATICACIDVEN  --  8.7*  6.5* 4.4* 3.1*  --     Estimated Creatinine Clearance: 20.8 mL/min (A) (by C-G formula based on SCr of 4.76 mg/dL (H)).    Allergies  Allergen Reactions  . Cyclobenzaprine Other (See Comments)    Not improving pain and makes him agressive.  . Lisinopril Cough    Antimicrobials this admission: Zosyn 5/12 >>  Microbiology results: 5/12 Bcx/BCID 2/4 bottles (one set) with Enterococcus faecalis and Bacteroides fragilis 5/12 Cx (abscess): gram negative coccobacilli  5/12 MRSA PCR: negative  Thank you for allowing pharmacy to be a part of this patient's care.  Sherilyn Banker, PharmD Pharmacy Resident  09/26/2020 7:28 AM

## 2020-09-26 NOTE — Consult Note (Signed)
[facility]  CONSULT NOTE    Date: 09/26/2020                  Patient Name:  Walfred Farson  MRN: [id number]  DOB: [birth year]  Age / Sex: 65 y.o., male         PCP: [name], DO                 Service Requesting Consult: Dr. Mortimer Fries                 Reason for Consult: Acute kidney injury            History of Present Illness: Mr. Andwele Siddons who recently underwent cholecystectomy and lysis of adhesions on 4/25 by Dr. Celine Ahr. He presents today with confusion and found to have right upper quadrant abdominal pain, acute liver injury, lactic acidosis and acute renal failure.  Nephrology consulted for acute renal failure.  Patient underwent abdominal drain for intraadbominal abscess.  Patient is confused and unable to get a history from.    Medications: Outpatient medications: Medications Prior to Admission  Medication Sig Dispense Refill Last Dose  . ibuprofen (ADVIL) 400 MG tablet Take 1 tablet (400 mg total) by mouth every 6 (six) hours as needed. 30 tablet 0 09/24/2020 at Unknown time  . metoprolol tartrate (LOPRESSOR) 25 MG tablet Take 0.5 tablets (12.5 mg total) by mouth 2 (two) times daily. 180 tablet 1 09/25/2020 at AM  . oxyCODONE (OXY IR/ROXICODONE) 5 MG immediate release tablet Take 1 tablet (5 mg total) by mouth every 6 (six) hours as needed for severe pain or breakthrough pain. 20 tablet 0 09/24/2020 at Unknown time  . pravastatin (PRAVACHOL) 20 MG tablet Take 1 tablet (20 mg total) by mouth daily. 90 tablet 1 09/25/2020 at 0600  . zolpidem (AMBIEN) 5 MG tablet Take 1 tablet (5 mg total) by mouth at bedtime as needed for sleep. 20 tablet 1 Past Week at Unknown time  . acetaminophen (TYLENOL) 325 MG tablet Take 1 tablet (325 mg total) by mouth every 6 (six) hours as needed for mild pain, fever or headache (or Fever >/= 101). (Patient not taking: No sig reported)     . diphenhydramine-acetaminophen (TYLENOL PM) 25-500 MG TABS  tablet Take 1 tablet by mouth at bedtime as needed. (Patient not taking: No sig reported)     . OZEMPIC, 0.25 OR 0.5 MG/DOSE, 2 MG/1.5ML SOPN Inject 1 mg into the skin once a week. (Patient taking differently: Inject 1 mg into the skin every Thursday.) 1.5 mL 0 09/18/2020    Current medications: Current Facility-Administered Medications  Medication Dose Route Frequency Provider Last Rate Last Admin  . 0.9 %  sodium chloride infusion  250 mL Intravenous Continuous Blake Divine, MD   Held at 09/25/20 1647  . 0.9 %  sodium chloride infusion  250 mL Intravenous PRN Flora Lipps, MD      . 0.9 %  sodium chloride infusion  250 mL Intravenous Continuous Flora Lipps, MD      . acetaminophen (TYLENOL) tablet 650 mg  650 mg Oral Q4H PRN Flora Lipps, MD      . Chlorhexidine Gluconate Cloth 2 % PADS 6 each  6 each Topical Daily Flora Lipps, MD   6 each at 09/25/20 1924  . dexmedetomidine (PRECEDEX) 400 MCG/100ML (4 mcg/mL) infusion  0.4-1.2 mcg/kg/hr Intravenous Titrated Darel Hong D, NP      . docusate sodium (COLACE) capsule 100  mg  100 mg Oral BID PRN Flora Lipps, MD      . fentaNYL (SUBLIMAZE) injection 50 mcg  50 mcg Intravenous Q1H PRN Flora Lipps, MD   50 mcg at 09/26/20 1057  . hydrocortisone sodium succinate (SOLU-CORTEF) 100 MG injection 50 mg  50 mg Intravenous Q6H Flora Lipps, MD   50 mg at 09/26/20 0615  . insulin aspart (novoLOG) injection 0-5 Units  0-5 Units Subcutaneous QHS Darel Hong D, NP      . insulin aspart (novoLOG) injection 0-9 Units  0-9 Units Subcutaneous TID WC Darel Hong D, NP      . MEDLINE mouth rinse  15 mL Mouth Rinse BID Flora Lipps, MD      . midazolam (VERSED) injection 2 mg  2 mg Intravenous Q1H PRN Flora Lipps, MD   2 mg at 09/26/20 1057  . midodrine (PROAMATINE) tablet 10 mg  10 mg Oral TID WC Flora Lipps, MD      . norepinephrine (LEVOPHED) 4mg  in 298mL premix infusion  2-10 mcg/min Intravenous Titrated Blake Divine, MD 26.3 mL/hr at  09/26/20 0700 7 mcg/min at 09/26/20 0700  . ondansetron (ZOFRAN) injection 4 mg  4 mg Intravenous Q6H PRN Flora Lipps, MD      . pantoprazole (PROTONIX) injection 40 mg  40 mg Intravenous Q24H Flora Lipps, MD   40 mg at 09/25/20 2233  . piperacillin-tazobactam (ZOSYN) IVPB 3.375 g  3.375 g Intravenous Q8H Kasa, Maretta Bees, MD 12.5 mL/hr at 09/26/20 0614 3.375 g at 09/26/20 0614  . polyethylene glycol (MIRALAX / GLYCOLAX) packet 17 g  17 g Oral Daily PRN Flora Lipps, MD      . risperiDONE (RISPERDAL) tablet 1 mg  1 mg Oral Once Kasa, Kurian, MD      . sodium bicarbonate 150 mEq in sterile water 1,150 mL infusion   Intravenous Continuous Bradly Bienenstock, NP 75 mL/hr at 09/26/20 1102 New Bag at 09/26/20 1102  . sodium chloride flush (NS) 0.9 % injection 3 mL  3 mL Intravenous Q12H Flora Lipps, MD   3 mL at 09/25/20 2226  . sodium chloride flush (NS) 0.9 % injection 3 mL  3 mL Intravenous PRN Flora Lipps, MD      . sodium chloride flush (NS) 0.9 % injection 5 mL  5 mL Intracatheter Q8H Criselda Peaches, MD   5 mL at 09/26/20 4270  . zolpidem (AMBIEN) tablet 5 mg  5 mg Oral QHS Flora Lipps, MD      . zolpidem (AMBIEN) tablet 5 mg  5 mg Oral Once Flora Lipps, MD          Allergies: Allergies  Allergen Reactions  . Cyclobenzaprine Other (See Comments)    Not improving pain and makes him agressive.  . Lisinopril Cough      Past Medical History: Past Medical History:  Diagnosis Date  . Cancer [facility])    Bladder Cancer  . Erectile dysfunction associated with type 2 diabetes mellitus ([place]) 05/26/2016  . Gross hematuria 11/16/2016  . Hypertension 06/11/2015  . Type 2 diabetes mellitus with diabetic polyneuropathy, without long-term current use of insulin ([place]) 06/11/2015     Past Surgical History: Past Surgical History:  Procedure Laterality Date  . boil lanceted     hand  . COLONOSCOPY WITH PROPOFOL N/A 07/03/2020   Procedure: COLONOSCOPY WITH PROPOFOL;  Surgeon: Jonathon Bellows, MD;   Location: [facility] ENDOSCOPY;  Service: Gastroenterology;  Laterality: N/A;  . CYSTOSCOPY W/ RETROGRADES Bilateral 12/06/2016  Procedure: CYSTOSCOPY WITH RETROGRADE PYELOGRAM;  Surgeon: Hollice Espy, MD;  Location: [facility] ORS;  Service: Urology;  Laterality: Bilateral;  . CYSTOSCOPY W/ RETROGRADES Bilateral 06/27/2017   Procedure: CYSTOSCOPY WITH RETROGRADE PYELOGRAM;  Surgeon: Hollice Espy, MD;  Location: [facility] ORS;  Service: Urology;  Laterality: Bilateral;  . CYSTOSCOPY WITH BIOPSY N/A 06/27/2017   Procedure: CYSTOSCOPY WITH Bladder BIOPSY;  Surgeon: Hollice Espy, MD;  Location: [facility] ORS;  Service: Urology;  Laterality: N/A;  . CYSTOSCOPY WITH BIOPSY N/A 10/24/2017   Procedure: CYSTOSCOPY WITH Bladder BIOPSY;  Surgeon: Hollice Espy, MD;  Location: [facility] ORS;  Service: Urology;  Laterality: N/A;  . ingrown  Bilateral    ingrown toenail  . IR BILIARY DRAIN PLACEMENT WITH CHOLANGIOGRAM  07/10/2020  . IR CHOLANGIOGRAM EXISTING TUBE  08/07/2020  . IR RADIOLOGIST EVAL & MGMT  08/07/2020  . TRANSURETHRAL RESECTION OF BLADDER TUMOR N/A 12/06/2016   Procedure: TRANSURETHRAL RESECTION OF BLADDER TUMOR (TURBT) (2-5cm) CLOT EVACUATION;  Surgeon: Hollice Espy, MD;  Location: [facility] ORS;  Service: Urology;  Laterality: N/A;  . TRANSURETHRAL RESECTION OF BLADDER TUMOR N/A 06/27/2017   Procedure: TRANSURETHRAL RESECTION OF BLADDER TUMOR (TURBT);  Surgeon: Hollice Espy, MD;  Location: [facility] ORS;  Service: Urology;  Laterality: N/A;  . TRANSURETHRAL RESECTION OF BLADDER TUMOR WITH MITOMYCIN-C N/A 01/18/2017   Procedure: TRANSURETHRAL RESECTION OF BLADDER TUMOR WITH MITOMYCIN-C-(SMALL);  Surgeon: Hollice Espy, MD;  Location: [facility] ORS;  Service: Urology;  Laterality: N/A;     Family History: Family History  Problem Relation Age of Onset  . Heart attack Mother   . Heart disease Mother   . Parkinson's disease Father   . Heart attack Maternal Grandfather   . Heart disease Maternal Grandfather   . Heart  attack Maternal Aunt   . Heart attack Maternal Grandmother   . Prostate cancer Neg Hx   . Bladder Cancer Neg Hx   . Kidney cancer Neg Hx      Social History: Social History   Socioeconomic History  . Marital status: Divorced    Spouse name: Not on file  . Number of children: Not on file  . Years of education: Not on file  . Highest education level: Not on file  Occupational History  . Not on file  Tobacco Use  . Smoking status: Never Smoker  . Smokeless tobacco: Never Used  . Tobacco comment: once a while had one cigar >10 years ago  Vaping Use  . Vaping Use: Never used  Substance and Sexual Activity  . Alcohol use: No    Alcohol/week: 0.0 standard drinks  . Drug use: No  . Sexual activity: Never  Other Topics Concern  . Not on file  Social History Narrative   Lives alone   Social Determinants of Health   Financial Resource Strain: Not on file  Food Insecurity: Not on file  Transportation Needs: Not on file  Physical Activity: Not on file  Stress: Not on file  Social Connections: Not on file  Intimate Partner Violence: Not on file     Review of Systems: Review of Systems  Unable to perform ROS: Mental status change    Vital Signs: Blood pressure 110/82, pulse 73, temperature 98.2 F (36.8 C), temperature source Oral, resp. rate 17, height 6\' 4"  (1.93 m), weight 107 kg, SpO2 98 %.  Weight trends: Filed Weights   09/25/20 1438 09/25/20 1851 09/26/20 0500  Weight: 113 kg 109.5 kg 107 kg    Physical Exam: General: NAD,   Head:  Normocephalic, atraumatic. Moist oral mucosal membranes  Eyes: Anicteric, PERRL  Neck: Supple, trachea midline  Lungs:  Clear to auscultation  Heart: Regular rate and rhythm  Abdomen:  +drain  Extremities:  no peripheral edema.  Neurologic: Nonfocal, moving all four extremities  Skin: No lesions  Access: none     Lab results: Basic Metabolic Panel: Recent Labs  Lab 09/25/20 1444 09/25/20 1909 09/26/20 0420  NA 135   --  135  K 3.1*  --  3.9  CL 96*  --  98  CO2 17*  --  17*  GLUCOSE 84  --  148*  BUN 46*  --  51*  CREATININE 4.89*  --  4.76*  CALCIUM 8.5*  --  7.9*  MG  --  2.3 2.4  PHOS  --  5.1* 7.5*    Liver Function Tests: Recent Labs  Lab 09/25/20 1444 09/26/20 0420  AST 121* 133*  ALT 56* 56*  ALKPHOS 459* 360*  BILITOT 9.5* 6.7*  PROT 8.2* 7.6  ALBUMIN 2.6* 2.4*   No results for input(s): LIPASE, AMYLASE in the last 168 hours. Recent Labs  Lab 09/25/20 1502  AMMONIA 19    CBC: Recent Labs  Lab 09/25/20 1444 09/26/20 0420  WBC 25.5* 45.5*  NEUTROABS 24.0*  --   HGB 11.5* 10.3*  HCT 34.1* 31.2*  MCV 80.4 81.9  PLT 535* 524*    Cardiac Enzymes: No results for input(s): CKTOTAL, CKMB, CKMBINDEX, TROPONINI in the last 168 hours.  BNP: Invalid input(s): POCBNP  CBG: Recent Labs  Lab 09/25/20 1439 09/25/20 1834 09/25/20 2232  GLUCAP 88 96 134*    Microbiology: Results for orders placed or performed during the hospital encounter of 09/25/20  Culture, blood (routine x 2)     Status: None (Preliminary result)   Collection Time: 09/25/20  2:41 PM   Specimen: BLOOD  Result Value Ref Range Status   Specimen Description BLOOD RIGHT ANTECUBITAL  Final   Special Requests   Final    BOTTLES DRAWN AEROBIC AND ANAEROBIC Blood Culture results may not be optimal due to an excessive volume of blood received in culture bottles   Culture  Setup Time   Final    Organism ID to follow GRAM POSITIVE COCCI IN BOTH AEROBIC AND ANAEROBIC BOTTLES CRITICAL RESULT CALLED TO, READ BACK BY AND VERIFIED WITH: LISA KLUTTZ @0802  ON 09/26/20.SH Performed at [facility], [address], [place], [place] [address]    Culture [facility] POSITIVE COCCI  Final   Report Status PENDING  Incomplete  Blood Culture ID Panel (Reflexed)     Status: Abnormal   Collection Time: 09/25/20  2:41 PM  Result Value Ref Range Status   Enterococcus faecalis DETECTED (A) NOT DETECTED Final     Comment: CRITICAL RESULT CALLED TO, READ BACK BY AND VERIFIED WITH: LISA KLUTTZ @0802  ON 09/26/20.SH    Enterococcus Faecium NOT DETECTED NOT DETECTED Final   Listeria monocytogenes NOT DETECTED NOT DETECTED Final   Staphylococcus species NOT DETECTED NOT DETECTED Final   Staphylococcus aureus (BCID) NOT DETECTED NOT DETECTED Final   Staphylococcus epidermidis NOT DETECTED NOT DETECTED Final   Staphylococcus lugdunensis NOT DETECTED NOT DETECTED Final   Streptococcus species NOT DETECTED NOT DETECTED Final   Streptococcus agalactiae NOT DETECTED NOT DETECTED Final   Streptococcus pneumoniae NOT DETECTED NOT DETECTED Final   Streptococcus pyogenes NOT DETECTED NOT DETECTED Final   A.calcoaceticus-baumannii NOT DETECTED NOT DETECTED Final   Bacteroides fragilis DETECTED (A) NOT DETECTED  Final    Comment: CRITICAL RESULT CALLED TO, READ BACK BY AND VERIFIED WITH: LISA KLUTTZ @0802  ON 09/26/20.[place]    Enterobacterales NOT DETECTED NOT DETECTED Final   Enterobacter cloacae complex NOT DETECTED NOT DETECTED Final   Escherichia coli NOT DETECTED NOT DETECTED Final   Klebsiella aerogenes NOT DETECTED NOT DETECTED Final   Klebsiella oxytoca NOT DETECTED NOT DETECTED Final   Klebsiella pneumoniae NOT DETECTED NOT DETECTED Final   Proteus species NOT DETECTED NOT DETECTED Final   Salmonella species NOT DETECTED NOT DETECTED Final   Serratia marcescens NOT DETECTED NOT DETECTED Final   Haemophilus influenzae NOT DETECTED NOT DETECTED Final   Neisseria meningitidis NOT DETECTED NOT DETECTED Final   Pseudomonas aeruginosa NOT DETECTED NOT DETECTED Final   Stenotrophomonas maltophilia NOT DETECTED NOT DETECTED Final   Candida albicans NOT DETECTED NOT DETECTED Final   Candida auris NOT DETECTED NOT DETECTED Final   Candida glabrata NOT DETECTED NOT DETECTED Final   Candida krusei NOT DETECTED NOT DETECTED Final   Candida parapsilosis NOT DETECTED NOT DETECTED Final   Candida tropicalis NOT  DETECTED NOT DETECTED Final   Cryptococcus neoformans/gattii NOT DETECTED NOT DETECTED Final   Vancomycin resistance NOT DETECTED NOT DETECTED Final    Comment: Performed at [facility], [address] [place], [place] [address]  Culture, blood (routine x 2)     Status: None (Preliminary result)   Collection Time: 09/25/20  3:42 PM   Specimen: BLOOD  Result Value Ref Range Status   Specimen Description BLOOD BLOOD LEFT HAND  Final   Special Requests   Final    BOTTLES DRAWN AEROBIC AND ANAEROBIC Blood Culture adequate volume   Culture   Final    NO GROWTH < 24 HOURS Performed at [facility], [address] [place], [place] [address]    Report Status PENDING  Incomplete  Resp Panel by RT-PCR (Flu A&B, Covid) Nasopharyngeal Swab     Status: None   Collection Time: 09/25/20  3:51 PM   Specimen: Nasopharyngeal Swab; Nasopharyngeal(NP) swabs in vial transport medium  Result Value Ref Range Status   SARS Coronavirus 2 by RT PCR NEGATIVE NEGATIVE Final    Comment: (NOTE) SARS-CoV-2 target nucleic acids are NOT DETECTED.  The SARS-CoV-2 RNA is generally detectable in upper respiratory specimens during the acute phase of infection. The lowest concentration of SARS-CoV-2 viral copies this assay can detect is 138 copies/mL. A negative result does not preclude SARS-Cov-2 infection and should not be used as the sole basis for treatment or other patient management decisions. A negative result may occur with  improper specimen collection/handling, submission of specimen other than nasopharyngeal swab, presence of viral mutation(s) within the areas targeted by this assay, and inadequate number of viral copies(<138 copies/mL). A negative result must be combined with clinical observations, patient history, and epidemiological information. The expected result is Negative.  Fact Sheet for Patients:  EntrepreneurPulse.com.au  Fact Sheet for Healthcare  Providers:  IncredibleEmployment.be  This test is no t yet approved or cleared by the [place] FDA and  has been authorized for detection and/or diagnosis of SARS-CoV-2 by FDA under an Emergency Use Authorization (EUA). This EUA will remain  in effect (meaning this test can be used) for the duration of the COVID-19 declaration under Section 564(b)(1) of the Act, 21 U.S.C.section 360bbb-3(b)(1), unless the authorization is terminated  or revoked sooner.       Influenza A by PCR NEGATIVE NEGATIVE Final   Influenza B  by PCR NEGATIVE NEGATIVE Final    Comment: (NOTE) The Xpert Xpress SARS-CoV-2/FLU/RSV plus assay is intended as an aid in the diagnosis of influenza from Nasopharyngeal swab specimens and should not be used as a sole basis for treatment. Nasal washings and aspirates are unacceptable for Xpert Xpress SARS-CoV-2/FLU/RSV testing.  Fact Sheet for Patients: EntrepreneurPulse.com.au  Fact Sheet for Healthcare Providers: IncredibleEmployment.be  This test is not yet approved or cleared by the [place] FDA and has been authorized for detection and/or diagnosis of SARS-CoV-2 by FDA under an Emergency Use Authorization (EUA). This EUA will remain in effect (meaning this test can be used) for the duration of the COVID-19 declaration under Section 564(b)(1) of the Act, 21 U.S.C. section 360bbb-3(b)(1), unless the authorization is terminated or revoked.  Performed at [facility], [address] [place], [place] [address]   MRSA PCR Screening     Status: None   Collection Time: 09/25/20  6:44 PM   Specimen: Nasopharyngeal  Result Value Ref Range Status   MRSA by PCR NEGATIVE NEGATIVE Final    Comment:        The GeneXpert MRSA Assay (FDA approved for NASAL specimens only), is one component of a comprehensive MRSA colonization surveillance program. It is not intended to diagnose MRSA infection nor  to guide or monitor treatment for MRSA infections. Performed at [facility], [address] [place], [place] [address]   Aerobic/Anaerobic Culture (surgical/deep wound)     Status: None (Preliminary result)   Collection Time: 09/25/20  9:02 PM   Specimen: Abscess  Result Value Ref Range Status   Specimen Description   Final    ABSCESS Performed at [facility], [address] [place], [place] [address]    Special Requests   Final    Normal Performed at [facility], [address] [place], [place] [address]    Gram Stain   Final    ABUNDANT WBC PRESENT,BOTH PMN AND MONONUCLEAR MODERATE GRAM NEGATIVE COCCOBACILLI Performed at [facility], [address], [place], [place] [address]    Culture PENDING  Incomplete   Report Status PENDING  Incomplete    Coagulation Studies: Recent Labs    09/25/20 1444  LABPROT 17.7*  INR 1.5*    Urinalysis: No results for input(s): COLORURINE, LABSPEC, PHURINE, GLUCOSEU, HGBUR, BILIRUBINUR, KETONESUR, PROTEINUR, UROBILINOGEN, NITRITE, LEUKOCYTESUR in the last 72 hours.  Invalid input(s): APPERANCEUR    Imaging: CT ABDOMEN PELVIS WO CONTRAST  Result Date: 09/25/2020 CLINICAL DATA:  Slurred speech. Balance disturbance. Recent cholecystectomy. Abdominal discomfort. EXAM: CT ABDOMEN AND PELVIS WITHOUT CONTRAST TECHNIQUE: Multidetector CT imaging of the abdomen and pelvis was performed following the standard protocol without IV contrast. COMPARISON:  07/09/2020 FINDINGS: Lower chest: Patchy atelectasis in both lower lobes. No consolidation or lobar collapse. Hepatobiliary: Large abscess in the gallbladder fossa, measuring over 10 cm in diameter. Air bubbles. Surrounding edematous change in the regional fat. Pancreas: Normal Spleen: Normal Adrenals/Urinary Tract: Adrenal glands are normal. Kidneys are normal. Bladder is normal. Stomach/Bowel: Stomach and small intestine are normal. No evidence of appendicitis.  Evidence of colon obstruction. Diverticulosis without evidence of diverticulitis. Vascular/Lymphatic: Aortic atherosclerosis. No aneurysm. IVC is normal. No retroperitoneal adenopathy. Reproductive: Normal Other: No free fluid or air.  No evidence of distant abscess. Musculoskeletal: Advanced lower lumbar degenerative changes. IMPRESSION: Large abscess in the gallbladder fossa, at least 10 cm in size. No evidence free intraperitoneal fluid or distant abscess/collection. Atelectasis in both lower lobes. Aortic  Atherosclerosis (ICD10-I70.0). Electronically Signed   By: Nelson Chimes M.D.   On: 09/25/2020 16:31   CT HEAD WO CONTRAST  Result Date: 09/25/2020 CLINICAL DATA:  Slurred speech.  Recent cholecystectomy. EXAM: CT HEAD WITHOUT CONTRAST TECHNIQUE: Contiguous axial images were obtained from the base of the skull through the vertex without intravenous contrast. COMPARISON:  None. FINDINGS: Brain: Mild generalized volume loss. No evidence of old or acute focal infarction, mass lesion, hemorrhage, hydrocephalus or extra-axial collection. Vascular: There is atherosclerotic calcification of the major vessels at the base of the brain. Skull: Negative Sinuses/Orbits: Clear/normal Other: None IMPRESSION: No acute or significant finding.  Mild generalized volume loss. Electronically Signed   By: Nelson Chimes M.D.   On: 09/25/2020 16:33   DG Chest Portable 1 View  Result Date: 09/25/2020 CLINICAL DATA:  Sepsis and altered mental status EXAM: PORTABLE CHEST 1 VIEW COMPARISON:  None. FINDINGS: Cardiac shadow is within normal limits. The lungs are hypoinflated with mild bibasilar atelectasis. No bony abnormality is seen. IMPRESSION: Mild bibasilar atelectasis in part due to a poor inspiratory effort. Electronically Signed   By: Inez Catalina M.D.   On: 09/25/2020 16:05   CT IMAGE GUIDED DRAINAGE BY PERCUTANEOUS CATHETER  Result Date: 09/26/2020 INDICATION: 66 year old male with a history of prior acute cholecystitis  initially treated by percutaneous cholecystostomy tube placement followed by definitive cholecystectomy. Patient now presents with sepsis and a large abscess in the gallbladder fossa. He requires urgent drainage for source control. EXAM: CT-guided drain placement MEDICATIONS: The patient is currently admitted to the hospital and receiving intravenous antibiotics. The antibiotics were administered within an appropriate time frame prior to the initiation of the procedure. ANESTHESIA/SEDATION: Fentanyl 50 mcg IV; Versed 1 mg IV Moderate Sedation Time:  15 minutes The patient was continuously monitored during the procedure by the interventional radiology nurse under my direct supervision. COMPLICATIONS: None immediate. PROCEDURE: Informed written consent was obtained from the patient after a thorough discussion of the procedural risks, benefits and alternatives. All questions were addressed. Maximal Sterile Barrier Technique was utilized including caps, mask, sterile gowns, sterile gloves, sterile drape, hand hygiene and skin antiseptic. A timeout was performed prior to the initiation of the procedure. A planning axial CT scan was performed. The large fluid and gas collection in the gallbladder fossa was successfully identified. A suitable skin entry site was selected and marked. The overlying skin was sterilely prepped and draped in the standard fashion using chlorhexidine skin prep. Local anesthesia was attained by infiltration with 1% lidocaine. A small dermatotomy was made. A 22 gauge spinal needle was advanced and imaged obtaining confirmation of the angle for a safe path into the fluid collection. Next, using tandem trocar technique, a 12 [place] cook all-purpose drainage catheter was advanced into the fluid collection. The catheter was formed and aspiration performed yielding approximately 200 mL purulent and foul-smelling fluid. Samples were sent for Gram stain and culture. The catheter was then connected to JP  bulb suction and secured to the skin with 0 Prolene suture. Follow-up CT imaging demonstrates a well-positioned drainage catheter. The patient was returned to the ICU in stable condition. IMPRESSION: Successful placement of 12 French drainage catheter into gallbladder fossa abscess with aspiration of approximately 200 mL foul-smelling and purulent fluid. Samples were sent for g stain and culture. Electronically Signed   By: Jacqulynn Cadet M.D.   On: 09/26/2020 09:31      Assessment & Plan: Mr. Erich Ricke is a 65 y.o. white male with  hypertension, diabetes mellitus type II, bladder cancer, who was admitted to [facility] on 09/25/2020 for Septic shock ([place]) [A41.9, R65.21] Altered mental status, unspecified altered mental status type [R41.82] Postprocedural intraabdominal abscess [T81.43XA] Sepsis with acute renal failure and septic shock, due to unspecified organism, unspecified acute renal failure type ([place]) [A41.9, R65.21, N17.9]  1. Acute Kidney Injury: baseline creatinine of 1.1, GFR >60 on 08/19/20. Urinalysis from 11/15 bland. History of gross hematuria and bladder cancer.  No IV contrast exposure.  - Check renal ultrasound - IV fluids - hold ibuprofen.   2. Metabolic acidosis anion gap: secondary to sepsis with lactic acidosis.  - Start sodium bicarbonate.   3. Anemia with renal failure: hemoglobin 10.3, normocytic.   4. Septic shock with blood cultures positive for Bacteroides and Enterococcus - requiring norepinephrine - started on PO midodrine.      LOS: 1 [place] Winningham 5/13/202211:20 AM

## 2020-09-26 NOTE — Progress Notes (Signed)
GOALS OF CARE DISCUSSION  The Clinical status was relayed to family in detail. Sister at bedside Updated and notified of patients medical condition.  Patient with progressive toxic metabolic encephalopathy  Explained to family course of therapy and the modalities    Patient with Progressive multiorgan failure with a very high probablity of a very minimal chance of meaningful recovery despite all aggressive and optimal medical therapy.  PATIENT REMAINS FULL CODE  Family understands the situation.  Family are satisfied with Plan of action and management. All questions answered  Additional CC time 35 mins   Dorita Rowlands Patricia Pesa, M.D.  [facility]  Medical Director [place] Director [facility] Cardio-Pulmonary Department

## 2020-09-26 NOTE — Consult Note (Signed)
Infectious Disease     Reason for Consult: Enterococcal bacteremia   Referring Physician: Dr Mortimer Fries Date of Admission:  09/25/2020   Active Problems:   Septic shock ([place])   HPI: Richard Oconnell is a 65 y.o. male with a history of type 2 diabetes, hypertension, hyperlipidemia, has had a complicated course since February 2022 when he was admitted with acute cholecystitis.  He was treated with initially IV antibiotics and then transition to oral antibiotics.  He was high risk for surgery so IR performed a percutaneous cholecystotomy on February 24.  He followed up with general surgery and plan was for laparoscopic cholecystectomy.  He underwent that on April 25.   Dced 4/26 on oral augmentin for 7 days.   He presented to the ED the day after discharge for wound check with a JP drain in place.  He was seen in follow-up April 28 with surgery when he was seen May 3 when his drain was removed.  He would have finished his 7 days of antibiotics around that time as well.  He then presented with recurrent abdominal pain and chills, confusion weakness.  He had a CT scan which showed a very large intra-abdominal abscess and a markedly elevated white count.  He also had elevated LFTs.  Patient underwent IR drainage of 200 cc of pus.  Drain remains in place.  He remains in the ICU and is on pressors.  He had acute agitation this morning as well and has been sedated.  He is not currently intubated.  He has been treated with IV Zosyn.  Blood cultures and cultures from his drain on admission are now growing Enterococcus.  Past Medical History:  Diagnosis Date  . Cancer [facility])    Bladder Cancer  . Erectile dysfunction associated with type 2 diabetes mellitus ([place]) 05/26/2016  . Gross hematuria 11/16/2016  . Hypertension 06/11/2015  . Type 2 diabetes mellitus with diabetic polyneuropathy, without long-term current use of insulin ([place]) 06/11/2015   Past Surgical History:  Procedure Laterality Date  . boil lanceted      hand  . COLONOSCOPY WITH PROPOFOL N/A 07/03/2020   Procedure: COLONOSCOPY WITH PROPOFOL;  Surgeon: Jonathon Bellows, MD;  Location: [facility] ENDOSCOPY;  Service: Gastroenterology;  Laterality: N/A;  . CYSTOSCOPY W/ RETROGRADES Bilateral 12/06/2016   Procedure: CYSTOSCOPY WITH RETROGRADE PYELOGRAM;  Surgeon: Hollice Espy, MD;  Location: [facility] ORS;  Service: Urology;  Laterality: Bilateral;  . CYSTOSCOPY W/ RETROGRADES Bilateral 06/27/2017   Procedure: CYSTOSCOPY WITH RETROGRADE PYELOGRAM;  Surgeon: Hollice Espy, MD;  Location: [facility] ORS;  Service: Urology;  Laterality: Bilateral;  . CYSTOSCOPY WITH BIOPSY N/A 06/27/2017   Procedure: CYSTOSCOPY WITH Bladder BIOPSY;  Surgeon: Hollice Espy, MD;  Location: [facility] ORS;  Service: Urology;  Laterality: N/A;  . CYSTOSCOPY WITH BIOPSY N/A 10/24/2017   Procedure: CYSTOSCOPY WITH Bladder BIOPSY;  Surgeon: Hollice Espy, MD;  Location: [facility] ORS;  Service: Urology;  Laterality: N/A;  . ingrown  Bilateral    ingrown toenail  . IR BILIARY DRAIN PLACEMENT WITH CHOLANGIOGRAM  07/10/2020  . IR CHOLANGIOGRAM EXISTING TUBE  08/07/2020  . IR RADIOLOGIST EVAL & MGMT  08/07/2020  . TRANSURETHRAL RESECTION OF BLADDER TUMOR N/A 12/06/2016   Procedure: TRANSURETHRAL RESECTION OF BLADDER TUMOR (TURBT) (2-5cm) CLOT EVACUATION;  Surgeon: Hollice Espy, MD;  Location: [facility] ORS;  Service: Urology;  Laterality: N/A;  . TRANSURETHRAL RESECTION OF BLADDER TUMOR N/A 06/27/2017   Procedure: TRANSURETHRAL RESECTION OF BLADDER TUMOR (TURBT);  Surgeon: Hollice Espy, MD;  Location:  [facility] ORS;  Service: Urology;  Laterality: N/A;  . TRANSURETHRAL RESECTION OF BLADDER TUMOR WITH MITOMYCIN-C N/A 01/18/2017   Procedure: TRANSURETHRAL RESECTION OF BLADDER TUMOR WITH MITOMYCIN-C-(SMALL);  Surgeon: Hollice Espy, MD;  Location: [facility] ORS;  Service: Urology;  Laterality: N/A;   Social History   Tobacco Use  . Smoking status: Never Smoker  . Smokeless tobacco: Never Used  . Tobacco comment:  once a while had one cigar >10 years ago  Vaping Use  . Vaping Use: Never used  Substance Use Topics  . Alcohol use: No    Alcohol/week: 0.0 standard drinks  . Drug use: No   Family History  Problem Relation Age of Onset  . Heart attack Mother   . Heart disease Mother   . Parkinson's disease Father   . Heart attack Maternal Grandfather   . Heart disease Maternal Grandfather   . Heart attack Maternal Aunt   . Heart attack Maternal Grandmother   . Prostate cancer Neg Hx   . Bladder Cancer Neg Hx   . Kidney cancer Neg Hx     Allergies:  Allergies  Allergen Reactions  . Cyclobenzaprine Other (See Comments)    Not improving pain and makes him agressive.  . Lisinopril Cough    Current antibiotics: Antibiotics Given (last 72 hours)    Date/Time Action Medication Dose Rate   09/25/20 1506 New Bag/Given   piperacillin-tazobactam (ZOSYN) IVPB 3.375 g 3.375 g 100 mL/hr   09/26/20 0106 New Bag/Given   piperacillin-tazobactam (ZOSYN) IVPB 3.375 g 3.375 g 12.5 mL/hr   09/26/20 0383 New Bag/Given   piperacillin-tazobactam (ZOSYN) IVPB 3.375 g 3.375 g 12.5 mL/hr      MEDICATIONS: . Chlorhexidine Gluconate Cloth  6 each Topical Daily  . hydrocortisone sod succinate (SOLU-CORTEF) inj  50 mg Intravenous Q6H  . insulin aspart  0-5 Units Subcutaneous QHS  . insulin aspart  0-9 Units Subcutaneous TID WC  . mouth rinse  15 mL Mouth Rinse BID  . midodrine  10 mg Oral TID WC  . pantoprazole (PROTONIX) IV  40 mg Intravenous Q24H  . risperiDONE  1 mg Oral Once  . sodium chloride flush  3 mL Intravenous Q12H  . sodium chloride flush  5 mL Intracatheter Q8H  . zolpidem  5 mg Oral QHS  . zolpidem  5 mg Oral Once    Review of Systems -unable to obtain OBJECTIVE: Temp:  [98.1 F (36.7 C)-99 F (37.2 C)] 98.2 F (36.8 C) (05/13 1300) Pulse Rate:  [62-101] 63 (05/13 1330) Resp:  [12-27] 22 (05/13 1330) BP: (58-138)/(48-98) 96/71 (05/13 1330) SpO2:  [89 %-100 %] 98 % (05/13  1330) Weight:  [107 kg-113 kg] 107 kg (05/13 0500) Physical Exam  Constitutional: obese sedate  HENT: anicteric Mouth/Throat: Oropharynx is clear and moist. Cardiovascular: Normal rate, regular rhythm and normal heart sounds. Pulmonary/Chest: Effort normal and breath sounds normal. No respiratory distress. He has no wheezes.  Abdominal: Soft. JP drain RUQ with purulent drainage Lymphadenopathy: He has no cervical adenopathy.  Neurological:sedated Skin: Skin is warm and dry. No rash noted. No erythema.    LABS: Results for orders placed or performed during the hospital encounter of 09/25/20 (from the past 48 hour(s))  CBG monitoring, ED     Status: None   Collection Time: 09/25/20  2:39 PM  Result Value Ref Range   Glucose-Capillary 88 70 - 99 mg/dL    Comment: Glucose reference range applies only to samples taken after fasting for at least 8  hours.  Culture, blood (routine x 2)     Status: None (Preliminary result)   Collection Time: 09/25/20  2:41 PM   Specimen: BLOOD  Result Value Ref Range   Specimen Description BLOOD RIGHT ANTECUBITAL    Special Requests      BOTTLES DRAWN AEROBIC AND ANAEROBIC Blood Culture results may not be optimal due to an excessive volume of blood received in culture bottles   Culture  Setup Time      Organism ID to follow [place] AND ANAEROBIC BOTTLES CRITICAL RESULT CALLED TO, READ BACK BY AND VERIFIED WITH: LISA KLUTTZ @0802  ON 09/26/20.SH Performed at [facility], [address] [place], [place] [address]    Culture GRAM POSITIVE COCCI    Report Status PENDING   Blood Culture ID Panel (Reflexed)     Status: Abnormal   Collection Time: 09/25/20  2:41 PM  Result Value Ref Range   Enterococcus faecalis DETECTED (A) NOT DETECTED    Comment: CRITICAL RESULT CALLED TO, READ BACK BY AND VERIFIED WITH: LISA KLUTTZ @0802  ON 09/26/20.SH    Enterococcus Faecium NOT DETECTED NOT DETECTED   Listeria monocytogenes  NOT DETECTED NOT DETECTED   Staphylococcus species NOT DETECTED NOT DETECTED   Staphylococcus aureus (BCID) NOT DETECTED NOT DETECTED   Staphylococcus epidermidis NOT DETECTED NOT DETECTED   Staphylococcus lugdunensis NOT DETECTED NOT DETECTED   Streptococcus species NOT DETECTED NOT DETECTED   Streptococcus agalactiae NOT DETECTED NOT DETECTED   Streptococcus pneumoniae NOT DETECTED NOT DETECTED   Streptococcus pyogenes NOT DETECTED NOT DETECTED   A.calcoaceticus-baumannii NOT DETECTED NOT DETECTED   Bacteroides fragilis DETECTED (A) NOT DETECTED    Comment: CRITICAL RESULT CALLED TO, READ BACK BY AND VERIFIED WITH: LISA KLUTTZ @0802  ON 09/26/20.SH    Enterobacterales NOT DETECTED NOT DETECTED   Enterobacter cloacae complex NOT DETECTED NOT DETECTED   Escherichia coli NOT DETECTED NOT DETECTED   Klebsiella aerogenes NOT DETECTED NOT DETECTED   Klebsiella oxytoca NOT DETECTED NOT DETECTED   Klebsiella pneumoniae NOT DETECTED NOT DETECTED   Proteus species NOT DETECTED NOT DETECTED   Salmonella species NOT DETECTED NOT DETECTED   Serratia marcescens NOT DETECTED NOT DETECTED   Haemophilus influenzae NOT DETECTED NOT DETECTED   Neisseria meningitidis NOT DETECTED NOT DETECTED   Pseudomonas aeruginosa NOT DETECTED NOT DETECTED   Stenotrophomonas maltophilia NOT DETECTED NOT DETECTED   Candida albicans NOT DETECTED NOT DETECTED   Candida auris NOT DETECTED NOT DETECTED   Candida glabrata NOT DETECTED NOT DETECTED   Candida krusei NOT DETECTED NOT DETECTED   Candida parapsilosis NOT DETECTED NOT DETECTED   Candida tropicalis NOT DETECTED NOT DETECTED   Cryptococcus neoformans/gattii NOT DETECTED NOT DETECTED   Vancomycin resistance NOT DETECTED NOT DETECTED    Comment: Performed at [facility], [address] [place], [place] [address]  Protime-INR     Status: Abnormal   Collection Time: 09/25/20  2:44 PM  Result Value Ref Range   Prothrombin Time 17.7 (H) 11.4 - 15.2  seconds   INR 1.5 (H) 0.8 - 1.2    Comment: (NOTE) INR goal varies based on device and disease states. Performed at [facility], [address] [place], [place] [address]   APTT     Status: Abnormal   Collection Time: 09/25/20  2:44 PM  Result Value Ref Range   aPTT 40 (H) 24 - 36 seconds    Comment:        IF  BASELINE aPTT IS ELEVATED, SUGGEST PATIENT RISK ASSESSMENT BE USED TO DETERMINE APPROPRIATE ANTICOAGULANT THERAPY. Performed at [facility], [address] [place], [place] [address]   CBC     Status: Abnormal   Collection Time: 09/25/20  2:44 PM  Result Value Ref Range   WBC 25.5 (H) 4.0 - 10.5 K/uL   RBC 4.24 4.22 - 5.81 MIL/uL   Hemoglobin 11.5 (L) 13.0 - 17.0 g/dL   HCT 34.1 (L) 39.0 - 52.0 %   MCV 80.4 80.0 - 100.0 fL   MCH 27.1 26.0 - 34.0 pg   MCHC 33.7 30.0 - 36.0 g/dL   RDW 14.9 11.5 - 15.5 %   Platelets 535 (H) 150 - 400 K/uL   nRBC 0.0 0.0 - 0.2 %    Comment: Performed at [facility], [address] [place], [place] [address]  Differential     Status: Abnormal   Collection Time: 09/25/20  2:44 PM  Result Value Ref Range   Neutrophils Relative % 94 %   Neutro Abs 24.0 (H) 1.7 - 7.7 K/uL   Lymphocytes Relative 2 %   Lymphs Abs 0.5 (L) 0.7 - 4.0 K/uL   Monocytes Relative 2 %   Monocytes Absolute 0.5 0.1 - 1.0 K/uL   Eosinophils Relative 1 %   Eosinophils Absolute 0.1 0.0 - 0.5 K/uL   Basophils Relative 0 %   Basophils Absolute 0.1 0.0 - 0.1 K/uL   WBC Morphology MORPHOLOGY UNREMARKABLE    RBC Morphology MORPHOLOGY UNREMARKABLE    Smear Review Normal platelet morphology    Immature Granulocytes 1 %   Abs Immature Granulocytes 0.28 (H) 0.00 - 0.07 K/uL    Comment: Performed at [facility], [address] [place], [place] [address]  Comprehensive metabolic panel     Status: Abnormal   Collection Time: 09/25/20  2:44 PM  Result Value Ref Range   Sodium 135 135 - 145 mmol/L    Comment: ELECTROLYTES  REPEATED TO VERIFY DB/SS   Potassium 3.1 (L) 3.5 - 5.1 mmol/L   Chloride 96 (L) 98 - 111 mmol/L   CO2 17 (L) 22 - 32 mmol/L   Glucose, Bld 84 70 - 99 mg/dL    Comment: Glucose reference range applies only to samples taken after fasting for at least 8 hours.   BUN 46 (H) 8 - 23 mg/dL   Creatinine, Ser 4.89 (H) 0.61 - 1.24 mg/dL   Calcium 8.5 (L) 8.9 - 10.3 mg/dL   Total Protein 8.2 (H) 6.5 - 8.1 g/dL   Albumin 2.6 (L) 3.5 - 5.0 g/dL   AST 121 (H) 15 - 41 U/L   ALT 56 (H) 0 - 44 U/L   Alkaline Phosphatase 459 (H) 38 - 126 U/L   Total Bilirubin 9.5 (H) 0.3 - 1.2 mg/dL   GFR, Estimated 12 (L) >60 mL/min    Comment: (NOTE) Calculated using the CKD-EPI Creatinine Equation (2021)    Anion gap 22 (H) 5 - 15    Comment: Performed at [facility], [address] [place], [place] [address]  Lactic acid, plasma     Status: Abnormal   Collection Time: 09/25/20  3:02 PM  Result Value Ref Range   Lactic Acid, Venous 6.5 (HH) 0.5 - 1.9 mmol/L    Comment: CRITICAL RESULT CALLED TO, READ BACK BY AND VERIFIED WITH RUSS AVANDANO AT 1603 ON 09/25/20 BY SS Performed at [facility], [address], [place], [place] [address]  Lactic acid, plasma     Status: Abnormal   Collection Time: 09/25/20  3:02 PM  Result Value Ref Range   Lactic Acid, Venous 8.7 (HH) 0.5 - 1.9 mmol/L    Comment: CRITICAL VALUE NOTED. VALUE IS CONSISTENT WITH PREVIOUSLY REPORTED/CALLED VALUE SS Performed at [facility], [address] [place], [place] [address]   Ammonia     Status: None   Collection Time: 09/25/20  3:02 PM  Result Value Ref Range   Ammonia 19 9 - 35 umol/L    Comment: Performed at [facility], [address] [place], [place] [address]  Culture, blood (routine x 2)     Status: None (Preliminary result)   Collection Time: 09/25/20  3:42 PM   Specimen: BLOOD  Result Value Ref Range   Specimen Description BLOOD BLOOD LEFT HAND    Special Requests      BOTTLES  DRAWN AEROBIC AND ANAEROBIC Blood Culture adequate volume   Culture      NO GROWTH < 24 HOURS Performed at [facility], [address], [place],  [address]    Report Status PENDING   Resp Panel by RT-PCR (Flu A&B, Covid) Nasopharyngeal Swab     Status: None   Collection Time: 09/25/20  3:51 PM   Specimen: Nasopharyngeal Swab; Nasopharyngeal(NP) swabs in vial transport medium  Result Value Ref Range   SARS Coronavirus 2 by RT PCR NEGATIVE NEGATIVE    Comment: (NOTE) SARS-CoV-2 target nucleic acids are NOT DETECTED.  The SARS-CoV-2 RNA is generally detectable in upper respiratory specimens during the acute phase of infection. The lowest concentration of SARS-CoV-2 viral copies this assay can detect is 138 copies/mL. A negative result does not preclude SARS-Cov-2 infection and should not be used as the sole basis for treatment or other patient management decisions. A negative result may occur with  improper specimen collection/handling, submission of specimen other than nasopharyngeal swab, presence of viral mutation(s) within the areas targeted by this assay, and inadequate number of viral copies(<138 copies/mL). A negative result must be combined with clinical observations, patient history, and epidemiological information. The expected result is Negative.  Fact Sheet for Patients:  EntrepreneurPulse.com.au  Fact Sheet for Healthcare Providers:  IncredibleEmployment.be  This test is no t yet approved or cleared by the [place] FDA and  has been authorized for detection and/or diagnosis of SARS-CoV-2 by FDA under an Emergency Use Authorization (EUA). This EUA will remain  in effect (meaning this test can be used) for the duration of the COVID-19 declaration under Section 564(b)(1) of the Act, 21 U.S.C.section 360bbb-3(b)(1), unless the authorization is terminated  or revoked sooner.       Influenza A by PCR NEGATIVE  NEGATIVE   Influenza B by PCR NEGATIVE NEGATIVE    Comment: (NOTE) The Xpert Xpress SARS-CoV-2/FLU/RSV plus assay is intended as an aid in the diagnosis of influenza from Nasopharyngeal swab specimens and should not be used as a sole basis for treatment. Nasal washings and aspirates are unacceptable for Xpert Xpress SARS-CoV-2/FLU/RSV testing.  Fact Sheet for Patients: EntrepreneurPulse.com.au  Fact Sheet for Healthcare Providers: IncredibleEmployment.be  This test is not yet approved or cleared by the [place] FDA and has been authorized for detection and/or diagnosis of SARS-CoV-2 by FDA under an Emergency Use Authorization (EUA). This EUA will remain in effect (meaning this test can be used) for the duration of the COVID-19 declaration under Section 564(b)(1) of the Act, 21 U.S.C. section 360bbb-3(b)(1), unless the authorization  is terminated or revoked.  Performed at [facility], [address] [place], [place] [address]   Glucose, capillary     Status: None   Collection Time: 09/25/20  6:34 PM  Result Value Ref Range   Glucose-Capillary 96 70 - 99 mg/dL    Comment: Glucose reference range applies only to samples taken after fasting for at least 8 hours.  MRSA PCR Screening     Status: None   Collection Time: 09/25/20  6:44 PM   Specimen: Nasopharyngeal  Result Value Ref Range   MRSA by PCR NEGATIVE NEGATIVE    Comment:        The GeneXpert MRSA Assay (FDA approved for NASAL specimens only), is one component of a comprehensive MRSA colonization surveillance program. It is not intended to diagnose MRSA infection nor to guide or monitor treatment for MRSA infections. Performed at [facility], [address] [place], [place] [address]   Lactic acid, plasma     Status: Abnormal   Collection Time: 09/25/20  7:05 PM  Result Value Ref Range   Lactic Acid, Venous 4.4 (HH) 0.5 - 1.9 mmol/L    Comment:  CRITICAL VALUE NOTED. VALUE IS CONSISTENT WITH PREVIOUSLY REPORTED/CALLED VALUE SS Performed at [facility], [address] [place], [place] [address]   Blood gas, venous     Status: Abnormal   Collection Time: 09/25/20  7:05 PM  Result Value Ref Range   pH, Ven 7.28 7.250 - 7.430   pCO2, Ven 40 (L) 44.0 - 60.0 mmHg   pO2, Ven 48.0 (H) 32.0 - 45.0 mmHg   Bicarbonate 18.8 (L) 20.0 - 28.0 mmol/L   Acid-base deficit 7.5 (H) 0.0 - 2.0 mmol/L   O2 Saturation 77.5 %   Patient temperature 37.0    Collection site VEIN    Sample type VENOUS     Comment: Performed at [facility], [address], [place], [place] [address]  Magnesium     Status: None   Collection Time: 09/25/20  7:09 PM  Result Value Ref Range   Magnesium 2.3 1.7 - 2.4 mg/dL    Comment: Performed at [facility], [address] [place], [place] [address]  Phosphorus     Status: Abnormal   Collection Time: 09/25/20  7:09 PM  Result Value Ref Range   Phosphorus 5.1 (H) 2.5 - 4.6 mg/dL    Comment: Performed at [facility], [address] [place], [place] [address]  Aerobic/Anaerobic Culture (surgical/deep wound)     Status: None (Preliminary result)   Collection Time: 09/25/20  9:02 PM   Specimen: Abscess  Result Value Ref Range   Specimen Description      ABSCESS Performed at [facility], [address], [place], [place] [address]    Special Requests      Normal Performed at [facility], [address] [place], [place] [address]    Gram Stain      ABUNDANT WBC PRESENT,BOTH PMN AND MONONUCLEAR MODERATE GRAM NEGATIVE COCCOBACILLI    Culture      CULTURE REINCUBATED FOR BETTER GROWTH Performed at [facility], [place] [address], [place], [place] [address]    Report Status PENDING   Lactic acid, plasma     Status: Abnormal   Collection Time: 09/25/20  9:51 PM  Result Value Ref Range   Lactic Acid, Venous 3.1 (HH) 0.5 - 1.9 mmol/L    Comment:  CRITICAL VALUE NOTED. VALUE IS CONSISTENT  WITH PREVIOUSLY REPORTED/CALLED VALUE SS Performed at [facility], [address] [place], [place] [address]   Blood gas, venous     Status: Abnormal   Collection Time: 09/25/20  9:51 PM  Result Value Ref Range   pH, Ven 7.35 7.250 - 7.430   pCO2, Ven 35 (L) 44.0 - 60.0 mmHg   pO2, Ven 41.0 32.0 - 45.0 mmHg   Bicarbonate 19.3 (L) 20.0 - 28.0 mmol/L   Acid-base deficit 5.7 (H) 0.0 - 2.0 mmol/L   O2 Saturation 73.2 %   Patient temperature 37.0    Collection site VEIN    Sample type VENOUS     Comment: Performed at [facility], [address] [place], [place] [address]  Glucose, capillary     Status: Abnormal   Collection Time: 09/25/20 10:32 PM  Result Value Ref Range   Glucose-Capillary 134 (H) 70 - 99 mg/dL    Comment: Glucose reference range applies only to samples taken after fasting for at least 8 hours.  CBC     Status: Abnormal   Collection Time: 09/26/20  4:20 AM  Result Value Ref Range   WBC 45.5 (H) 4.0 - 10.5 K/uL   RBC 3.81 (L) 4.22 - 5.81 MIL/uL   Hemoglobin 10.3 (L) 13.0 - 17.0 g/dL   HCT 31.2 (L) 39.0 - 52.0 %   MCV 81.9 80.0 - 100.0 fL   MCH 27.0 26.0 - 34.0 pg   MCHC 33.0 30.0 - 36.0 g/dL   RDW 15.1 11.5 - 15.5 %   Platelets 524 (H) 150 - 400 K/uL   nRBC 0.0 0.0 - 0.2 %    Comment: Performed at [facility], [address], [place], [place] [address]  Basic metabolic panel     Status: Abnormal   Collection Time: 09/26/20  4:20 AM  Result Value Ref Range   Sodium 135 135 - 145 mmol/L   Potassium 3.9 3.5 - 5.1 mmol/L   Chloride 98 98 - 111 mmol/L   CO2 17 (L) 22 - 32 mmol/L   Glucose, Bld 148 (H) 70 - 99 mg/dL    Comment: Glucose reference range applies only to samples taken after fasting for at least 8 hours.   BUN 51 (H) 8 - 23 mg/dL   Creatinine, Ser 4.76 (H) 0.61 - 1.24 mg/dL   Calcium 7.9 (L) 8.9 - 10.3 mg/dL   GFR, Estimated 13 (L) >60 mL/min    Comment: (NOTE) Calculated  using the CKD-EPI Creatinine Equation (2021)    Anion gap 20 (H) 5 - 15    Comment: Performed at [facility], [address] [place], [place] [address]  Magnesium     Status: None   Collection Time: 09/26/20  4:20 AM  Result Value Ref Range   Magnesium 2.4 1.7 - 2.4 mg/dL    Comment: Performed at [facility], [address] [place], [place] [address]  Phosphorus     Status: Abnormal   Collection Time: 09/26/20  4:20 AM  Result Value Ref Range   Phosphorus 7.5 (H) 2.5 - 4.6 mg/dL    Comment: Performed at [facility], [address] [place], [place] [address]  Hepatic function panel     Status: Abnormal   Collection Time: 09/26/20  4:20 AM  Result Value Ref Range   Total Protein 7.6 6.5 - 8.1 g/dL   Albumin 2.4 (L) 3.5 - 5.0 g/dL   AST 133 (H) 15 - 41 U/L  ALT 56 (H) 0 - 44 U/L   Alkaline Phosphatase 360 (H) 38 - 126 U/L   Total Bilirubin 6.7 (H) 0.3 - 1.2 mg/dL   Bilirubin, Direct 4.3 (H) 0.0 - 0.2 mg/dL   Indirect Bilirubin 2.4 (H) 0.3 - 0.9 mg/dL    Comment: Performed at [facility], [address] [place], [place] [address]  Procalcitonin - Baseline     Status: None   Collection Time: 09/26/20  4:20 AM  Result Value Ref Range   Procalcitonin 148.71 ng/mL    Comment:        Interpretation: PCT >= 10 ng/mL: Important systemic inflammatory response, almost exclusively due to severe bacterial sepsis or septic shock. (NOTE)       Sepsis PCT Algorithm           Lower Respiratory Tract                                      Infection PCT Algorithm    ----------------------------     ----------------------------         PCT < 0.25 ng/mL                PCT < 0.10 ng/mL          Strongly encourage             Strongly discourage   discontinuation of antibiotics    initiation of antibiotics    ----------------------------     -----------------------------       PCT 0.25 - 0.50 ng/mL            PCT 0.10 - 0.25 ng/mL               OR        >80% decrease in PCT            Discourage initiation of                                            antibiotics      Encourage discontinuation           of antibiotics    ----------------------------     -----------------------------         PCT >= 0.50 ng/mL              PCT 0.26 - 0.50 ng/mL                AND       <80% decrease in PCT             Encourage initiation of                                             antibiotics       Encourage continuation           of antibiotics    ----------------------------     -----------------------------        PCT >= 0.50 ng/mL                  PCT > 0.50 ng/mL               AND  increase in PCT                  Strongly encourage                                      initiation of antibiotics    Strongly encourage escalation           of antibiotics                                     -----------------------------                                           PCT <= 0.25 ng/mL                                                 OR                                        > 80% decrease in PCT                                      Discontinue / Do not initiate                                             antibiotics  Performed at [facility], [address] [place], [place] [address]    No components found for: ESR, C REACTIVE PROTEIN MICRO: Recent Results (from the past 720 hour(s))  Culture, blood (routine x 2)     Status: None (Preliminary result)   Collection Time: 09/25/20  2:41 PM   Specimen: BLOOD  Result Value Ref Range Status   Specimen Description BLOOD RIGHT ANTECUBITAL  Final   Special Requests   Final    BOTTLES DRAWN AEROBIC AND ANAEROBIC Blood Culture results may not be optimal due to an excessive volume of blood received in culture bottles   Culture  Setup Time   Final    Organism ID to follow GRAM POSITIVE COCCI IN BOTH AEROBIC AND ANAEROBIC BOTTLES CRITICAL RESULT CALLED TO, READ BACK BY AND VERIFIED  WITH: LISA KLUTTZ @0802  ON 09/26/20.SH Performed at [facility], [address] [place],  [address]    Culture [facility] POSITIVE COCCI  Final   Report Status PENDING  Incomplete  Blood Culture ID Panel (Reflexed)     Status: Abnormal   Collection Time: 09/25/20  2:41 PM  Result Value Ref Range Status   Enterococcus faecalis DETECTED (A) NOT DETECTED Final    Comment: CRITICAL RESULT CALLED TO, READ BACK BY AND VERIFIED WITH: LISA KLUTTZ @0802  ON 09/26/20.SH    Enterococcus Faecium NOT DETECTED NOT DETECTED Final   Listeria monocytogenes NOT DETECTED NOT DETECTED Final   Staphylococcus species NOT DETECTED NOT DETECTED Final  Staphylococcus aureus (BCID) NOT DETECTED NOT DETECTED Final   Staphylococcus epidermidis NOT DETECTED NOT DETECTED Final   Staphylococcus lugdunensis NOT DETECTED NOT DETECTED Final   Streptococcus species NOT DETECTED NOT DETECTED Final   Streptococcus agalactiae NOT DETECTED NOT DETECTED Final   Streptococcus pneumoniae NOT DETECTED NOT DETECTED Final   Streptococcus pyogenes NOT DETECTED NOT DETECTED Final   A.calcoaceticus-baumannii NOT DETECTED NOT DETECTED Final   Bacteroides fragilis DETECTED (A) NOT DETECTED Final    Comment: CRITICAL RESULT CALLED TO, READ BACK BY AND VERIFIED WITH: LISA KLUTTZ @0802  ON 09/26/20.[place]    Enterobacterales NOT DETECTED NOT DETECTED Final   Enterobacter cloacae complex NOT DETECTED NOT DETECTED Final   Escherichia coli NOT DETECTED NOT DETECTED Final   Klebsiella aerogenes NOT DETECTED NOT DETECTED Final   Klebsiella oxytoca NOT DETECTED NOT DETECTED Final   Klebsiella pneumoniae NOT DETECTED NOT DETECTED Final   Proteus species NOT DETECTED NOT DETECTED Final   Salmonella species NOT DETECTED NOT DETECTED Final   Serratia marcescens NOT DETECTED NOT DETECTED Final   Haemophilus influenzae NOT DETECTED NOT DETECTED Final   Neisseria meningitidis NOT DETECTED NOT DETECTED Final   Pseudomonas aeruginosa  NOT DETECTED NOT DETECTED Final   Stenotrophomonas maltophilia NOT DETECTED NOT DETECTED Final   Candida albicans NOT DETECTED NOT DETECTED Final   Candida auris NOT DETECTED NOT DETECTED Final   Candida glabrata NOT DETECTED NOT DETECTED Final   Candida krusei NOT DETECTED NOT DETECTED Final   Candida parapsilosis NOT DETECTED NOT DETECTED Final   Candida tropicalis NOT DETECTED NOT DETECTED Final   Cryptococcus neoformans/gattii NOT DETECTED NOT DETECTED Final   Vancomycin resistance NOT DETECTED NOT DETECTED Final    Comment: Performed at [facility], [address] [place], [place] [address]  Culture, blood (routine x 2)     Status: None (Preliminary result)   Collection Time: 09/25/20  3:42 PM   Specimen: BLOOD  Result Value Ref Range Status   Specimen Description BLOOD BLOOD LEFT HAND  Final   Special Requests   Final    BOTTLES DRAWN AEROBIC AND ANAEROBIC Blood Culture adequate volume   Culture   Final    NO GROWTH < 24 HOURS Performed at [facility], [address] [place], [place] [address]    Report Status PENDING  Incomplete  Resp Panel by RT-PCR (Flu A&B, Covid) Nasopharyngeal Swab     Status: None   Collection Time: 09/25/20  3:51 PM   Specimen: Nasopharyngeal Swab; Nasopharyngeal(NP) swabs in vial transport medium  Result Value Ref Range Status   SARS Coronavirus 2 by RT PCR NEGATIVE NEGATIVE Final    Comment: (NOTE) SARS-CoV-2 target nucleic acids are NOT DETECTED.  The SARS-CoV-2 RNA is generally detectable in upper respiratory specimens during the acute phase of infection. The lowest concentration of SARS-CoV-2 viral copies this assay can detect is 138 copies/mL. A negative result does not preclude SARS-Cov-2 infection and should not be used as the sole basis for treatment or other patient management decisions. A negative result may occur with  improper specimen collection/handling, submission of specimen other than nasopharyngeal swab,  presence of viral mutation(s) within the areas targeted by this assay, and inadequate number of viral copies(<138 copies/mL). A negative result must be combined with clinical observations, patient history, and epidemiological information. The expected result is Negative.  Fact Sheet for Patients:  EntrepreneurPulse.com.au  Fact Sheet for Healthcare Providers:  IncredibleEmployment.be  This test is no t yet approved or cleared by  the [employer] and  has been authorized for detection and/or diagnosis of SARS-CoV-2 by FDA under an Emergency Use Authorization (EUA). This EUA will remain  in effect (meaning this test can be used) for the duration of the COVID-19 declaration under Section 564(b)(1) of the Act, 21 U.S.C.section 360bbb-3(b)(1), unless the authorization is terminated  or revoked sooner.       Influenza A by PCR NEGATIVE NEGATIVE Final   Influenza B by PCR NEGATIVE NEGATIVE Final    Comment: (NOTE) The Xpert Xpress SARS-CoV-2/FLU/RSV plus assay is intended as an aid in the diagnosis of influenza from Nasopharyngeal swab specimens and should not be used as a sole basis for treatment. Nasal washings and aspirates are unacceptable for Xpert Xpress SARS-CoV-2/FLU/RSV testing.  Fact Sheet for Patients: EntrepreneurPulse.com.au  Fact Sheet for Healthcare Providers: IncredibleEmployment.be  This test is not yet approved or cleared by the [place] FDA and has been authorized for detection and/or diagnosis of SARS-CoV-2 by FDA under an Emergency Use Authorization (EUA). This EUA will remain in effect (meaning this test can be used) for the duration of the COVID-19 declaration under Section 564(b)(1) of the Act, 21 U.S.C. section 360bbb-3(b)(1), unless the authorization is terminated or revoked.  Performed at [facility], [address] [place], [place] [address]   MRSA PCR  Screening     Status: None   Collection Time: 09/25/20  6:44 PM   Specimen: Nasopharyngeal  Result Value Ref Range Status   MRSA by PCR NEGATIVE NEGATIVE Final    Comment:        The GeneXpert MRSA Assay (FDA approved for NASAL specimens only), is one component of a comprehensive MRSA colonization surveillance program. It is not intended to diagnose MRSA infection nor to guide or monitor treatment for MRSA infections. Performed at [facility], [address] [place], [place] [address]   Aerobic/Anaerobic Culture (surgical/deep wound)     Status: None (Preliminary result)   Collection Time: 09/25/20  9:02 PM   Specimen: Abscess  Result Value Ref Range Status   Specimen Description   Final    ABSCESS Performed at [facility], [address] [place], [place] [address]    Special Requests   Final    Normal Performed at [facility], [address] [place], [place] [address]    Gram Stain   Final    ABUNDANT WBC PRESENT,BOTH PMN AND MONONUCLEAR MODERATE GRAM NEGATIVE COCCOBACILLI    Culture   Final    CULTURE REINCUBATED FOR BETTER GROWTH Performed at [facility], [place] [address], [place], [place] [address]    Report Status PENDING  Incomplete    IMAGING: CT ABDOMEN PELVIS WO CONTRAST  Result Date: 09/25/2020 CLINICAL DATA:  Slurred speech. Balance disturbance. Recent cholecystectomy. Abdominal discomfort. EXAM: CT ABDOMEN AND PELVIS WITHOUT CONTRAST TECHNIQUE: Multidetector CT imaging of the abdomen and pelvis was performed following the standard protocol without IV contrast. COMPARISON:  07/09/2020 FINDINGS: Lower chest: Patchy atelectasis in both lower lobes. No consolidation or lobar collapse. Hepatobiliary: Large abscess in the gallbladder fossa, measuring over 10 cm in diameter. Air bubbles. Surrounding edematous change in the regional fat. Pancreas: Normal Spleen: Normal Adrenals/Urinary Tract: Adrenal glands are normal. Kidneys  are normal. Bladder is normal. Stomach/Bowel: Stomach and small intestine are normal. No evidence of appendicitis. Evidence of colon obstruction. Diverticulosis without evidence of diverticulitis. Vascular/Lymphatic: Aortic atherosclerosis. No aneurysm. IVC is normal. No retroperitoneal adenopathy. Reproductive: Normal Other: No free fluid  or air.  No evidence of distant abscess. Musculoskeletal: Advanced lower lumbar degenerative changes. IMPRESSION: Large abscess in the gallbladder fossa, at least 10 cm in size. No evidence free intraperitoneal fluid or distant abscess/collection. Atelectasis in both lower lobes. Aortic Atherosclerosis (ICD10-I70.0). Electronically Signed   By: Nelson Chimes M.D.   On: 09/25/2020 16:31   CT HEAD WO CONTRAST  Result Date: 09/25/2020 CLINICAL DATA:  Slurred speech.  Recent cholecystectomy. EXAM: CT HEAD WITHOUT CONTRAST TECHNIQUE: Contiguous axial images were obtained from the base of the skull through the vertex without intravenous contrast. COMPARISON:  None. FINDINGS: Brain: Mild generalized volume loss. No evidence of old or acute focal infarction, mass lesion, hemorrhage, hydrocephalus or extra-axial collection. Vascular: There is atherosclerotic calcification of the major vessels at the base of the brain. Skull: Negative Sinuses/Orbits: Clear/normal Other: None IMPRESSION: No acute or significant finding.  Mild generalized volume loss. Electronically Signed   By: Nelson Chimes M.D.   On: 09/25/2020 16:33   DG Chest Portable 1 View  Result Date: 09/25/2020 CLINICAL DATA:  Sepsis and altered mental status EXAM: PORTABLE CHEST 1 VIEW COMPARISON:  None. FINDINGS: Cardiac shadow is within normal limits. The lungs are hypoinflated with mild bibasilar atelectasis. No bony abnormality is seen. IMPRESSION: Mild bibasilar atelectasis in part due to a poor inspiratory effort. Electronically Signed   By: Inez Catalina M.D.   On: 09/25/2020 16:05   CT IMAGE GUIDED DRAINAGE BY  PERCUTANEOUS CATHETER  Result Date: 09/26/2020 INDICATION: 65 year old male with a history of prior acute cholecystitis initially treated by percutaneous cholecystostomy tube placement followed by definitive cholecystectomy. Patient now presents with sepsis and a large abscess in the gallbladder fossa. He requires urgent drainage for source control. EXAM: CT-guided drain placement MEDICATIONS: The patient is currently admitted to the hospital and receiving intravenous antibiotics. The antibiotics were administered within an appropriate time frame prior to the initiation of the procedure. ANESTHESIA/SEDATION: Fentanyl 50 mcg IV; Versed 1 mg IV Moderate Sedation Time:  15 minutes The patient was continuously monitored during the procedure by the interventional radiology nurse under my direct supervision. COMPLICATIONS: None immediate. PROCEDURE: Informed written consent was obtained from the patient after a thorough discussion of the procedural risks, benefits and alternatives. All questions were addressed. Maximal Sterile Barrier Technique was utilized including caps, mask, sterile gowns, sterile gloves, sterile drape, hand hygiene and skin antiseptic. A timeout was performed prior to the initiation of the procedure. A planning axial CT scan was performed. The large fluid and gas collection in the gallbladder fossa was successfully identified. A suitable skin entry site was selected and marked. The overlying skin was sterilely prepped and draped in the standard fashion using chlorhexidine skin prep. Local anesthesia was attained by infiltration with 1% lidocaine. A small dermatotomy was made. A 22 gauge spinal needle was advanced and imaged obtaining confirmation of the angle for a safe path into the fluid collection. Next, using tandem trocar technique, a 12 [place] cook all-purpose drainage catheter was advanced into the fluid collection. The catheter was formed and aspiration performed yielding approximately 200  mL purulent and foul-smelling fluid. Samples were sent for Gram stain and culture. The catheter was then connected to JP bulb suction and secured to the skin with 0 Prolene suture. Follow-up CT imaging demonstrates a well-positioned drainage catheter. The patient was returned to the ICU in stable condition. IMPRESSION: Successful placement of 12 French drainage catheter into gallbladder fossa abscess with aspiration of approximately 200 mL foul-smelling and purulent fluid.  Samples were sent for g stain and culture. Electronically Signed   By: Jacqulynn Cadet M.D.   On: 09/26/2020 09:31    Assessment:   Richard Oconnell is a 65 y.o. male  ith a history of type 2 diabetes, hypertension, hyperlipidemia, has had a complicated course since February 2022 when he was admitted with acute cholecystitis.  He was treated with initially IV antibiotics and then transition to oral antibiotics.  He was high risk for surgery so IR performed a percutaneous cholecystotomy on February 24.  He followed up with general surgery and had lap chole 4/25.  DC'd with a drain in place as well as on Augmentin for 7 days.  Drain was removed May 3.  He had not really been able to take good care of it at home.  He now presents with sepsis, chills, abdominal pain and found to have a very large intra-abdominal abscess.  He remained septic in the ICU on pressors.  Cultures of blood and abscess are growing Enterococcus.   Recommendations Continue Zosyn Continue drainage. Check echocardiogram  Thank you very much for allowing me to participate in the care of this patient. Please call with questions.   Cheral Marker. Ola Spurr, MD

## 2020-09-26 NOTE — Plan of Care (Signed)
Took pt to CT for drain placement at appox 2000, pt procedure without complications. 17 Fr RUQ JP drain placed, output recorded. Pt progressively getting delirious, pt has not slept during shift. Norepi titration according to pt VS, slow wean. Pt without any significant events during shift.  Problem: Education: Goal: Knowledge of General Education information will improve Description: Including pain rating scale, medication(s)/side effects and non-pharmacologic comfort measures Outcome: Progressing   Problem: Health Behavior/Discharge Planning: Goal: Ability to manage health-related needs will improve Outcome: Progressing   Problem: Activity: Goal: Risk for activity intolerance will decrease Outcome: Progressing   Problem: Clinical Measurements: Goal: Ability to maintain clinical measurements within normal limits will improve Outcome: Progressing Goal: Will remain free from infection Outcome: Progressing Goal: Diagnostic test results will improve Outcome: Progressing Goal: Respiratory complications will improve Outcome: Progressing Goal: Cardiovascular complication will be avoided Outcome: Progressing   Problem: Coping: Goal: Level of anxiety will decrease Outcome: Progressing   Problem: Elimination: Goal: Will not experience complications related to bowel motility Outcome: Progressing Goal: Will not experience complications related to urinary retention Outcome: Progressing   Problem: Pain Managment: Goal: General experience of comfort will improve Outcome: Progressing   Problem: Clinical Measurements: Goal: Diagnostic test results will improve Outcome: Progressing Goal: Signs and symptoms of infection will decrease Outcome: Progressing   Problem: Respiratory: Goal: Ability to maintain adequate ventilation will improve Outcome: Progressing   Problem: Respiratory: Goal: Ability to maintain adequate ventilation will improve Outcome: Progressing

## 2020-09-26 NOTE — Progress Notes (Signed)
Initial assessment this AM, patient was alert and oriented x3 - able to state his name/date/location but did not seem to understand the circumstances of his admission. Patient noted to have intermittent but frequent random conversations with different team members that did not make logical sense. By late morning, patient began to ask why there were ants and spiders on his bed and in the room. He also stated that one of the nursing tech's faces were melting off. Discussed concerns of delirium and hallucinations with Dr. Mortimer Fries. Initially the plan was to give PO risperidone and PO Ambien, given patient has not had any sleep in several days. Patient continued to get more confused, becoming combative, threatening to staff, and refusing to take any PO medications. He made multiple attempts to get OOB and stated several times that he was refusing care and going home. Family member at bedside, attempting to calm patient. He continued to shout, make threats, attempted to hit and kick staff so PRN Fentanyl and versed given x2 in addition to a precedex infusion. Pt eventually calmed down after medications were given and was convinced to get back in bed and rest. Patient is now asleep, precedex infusion continues. PRN haldol is ordered if he were to wake up and become combative again. Foley catheter placed due to patient's inability to void completely. UOP 425 mL on insertion, bloody-tea colored.

## 2020-09-26 NOTE — Progress Notes (Signed)
PHARMACY - PHYSICIAN COMMUNICATION CRITICAL VALUE ALERT - BLOOD CULTURE IDENTIFICATION (BCID)  Richard Oconnell is an 65 y.o. male who presented to [facility] on 09/25/2020 with a chief complaint of intra-abdominal infection  Assessment:  Blood cultures with GPC in both bottles on one set, BCID detected E faecalis and B. Fragilis.  Gallbladder fossa abscess s/o lap cholecystectomy 4/25 (include suspected source if known)  Name of physician (or Provider) Contacted: Dr Mortimer Fries  Current antibiotics: piperacillin/tazobactam  Changes to prescribed antibiotics recommended:  Patient is on recommended antibiotics - No changes needed.  ID to see patient  Results for orders placed or performed during the hospital encounter of 09/25/20  Blood Culture ID Panel (Reflexed) (Collected: 09/25/2020  2:41 PM)  Result Value Ref Range   Enterococcus faecalis DETECTED (A) NOT DETECTED   Enterococcus Faecium NOT DETECTED NOT DETECTED   Listeria monocytogenes NOT DETECTED NOT DETECTED   Staphylococcus species NOT DETECTED NOT DETECTED   Staphylococcus aureus (BCID) NOT DETECTED NOT DETECTED   Staphylococcus epidermidis NOT DETECTED NOT DETECTED   Staphylococcus lugdunensis NOT DETECTED NOT DETECTED   Streptococcus species NOT DETECTED NOT DETECTED   Streptococcus agalactiae NOT DETECTED NOT DETECTED   Streptococcus pneumoniae NOT DETECTED NOT DETECTED   Streptococcus pyogenes NOT DETECTED NOT DETECTED   A.calcoaceticus-baumannii NOT DETECTED NOT DETECTED   Bacteroides fragilis DETECTED (A) NOT DETECTED   Enterobacterales NOT DETECTED NOT DETECTED   Enterobacter cloacae complex NOT DETECTED NOT DETECTED   Escherichia coli NOT DETECTED NOT DETECTED   Klebsiella aerogenes NOT DETECTED NOT DETECTED   Klebsiella oxytoca NOT DETECTED NOT DETECTED   Klebsiella pneumoniae NOT DETECTED NOT DETECTED   Proteus species NOT DETECTED NOT DETECTED   Salmonella species NOT DETECTED NOT DETECTED   Serratia marcescens  NOT DETECTED NOT DETECTED   Haemophilus influenzae NOT DETECTED NOT DETECTED   Neisseria meningitidis NOT DETECTED NOT DETECTED   Pseudomonas aeruginosa NOT DETECTED NOT DETECTED   Stenotrophomonas maltophilia NOT DETECTED NOT DETECTED   Candida albicans NOT DETECTED NOT DETECTED   Candida auris NOT DETECTED NOT DETECTED   Candida glabrata NOT DETECTED NOT DETECTED   Candida krusei NOT DETECTED NOT DETECTED   Candida parapsilosis NOT DETECTED NOT DETECTED   Candida tropicalis NOT DETECTED NOT DETECTED   Cryptococcus neoformans/gattii NOT DETECTED NOT DETECTED   Vancomycin resistance NOT DETECTED NOT DETECTED    Doreene Eland, PharmD, BCPS.   Work Cell: [phone] 09/26/2020 3:32 PM

## 2020-09-26 NOTE — Progress Notes (Addendum)
NAME:  Richard Oconnell, MRN:  [id number], DOB:  1956-02-03, LOS: 1 ADMISSION DATE:  09/25/2020 REFERRING MD:  Charna Archer CHIEF COMPLAINT:  Feeling bad and belly pain  Brief Pt Description/Synopsis:  65 yo male with previous dx of acute cholecystitis s/p cholecystotomy tube 2/24 and then follow up robotic assisted cholecystectomy 4/25, now presents with large abdominal abscess with severe septic shock and BACTEREMIA, with multiorgan failure (liver and renal failure) complicated by severe metabolic acidosis and toxic metabolic encephalopathy.  History of Present Illness:  65 year old man past medical history of hypertension, diabetes, and bladder cancer who was hospitalized with acute cholecystitis-several months ago 2/24 He was temporized with a percutaneous cholecystostomy tube.    Follow up  cholangiogram via the tube demonstrates that the cystic duct is still obstructed He was offered him a robot-assisted laparoscopic cholecystectomy, but  deferred this procedure  On 4/25 recently had cholecystectomy along with significant lysis of adhesions on 4/25 here at [facility].  He presents today with somnolence and confusion.   Family reports that patient is disoriented the day before yesterday, after which his skin has begun to appear increasingly yellow.    +discomfort in the right upper quadrant of his abdomen, liver failure, renal failure, severe acidosis Patient started on pressors, admitted for septic shock  WBC 25 LA 6.5-->8.7 CREAT 4.8 TB 9.5   Pertinent  Medical History  2/24 admitted for acute cholecystitis,percutaneous cholecystostomy tube placed by IR  3/29 offered  a robot-assisted laparoscopic cholecystectomy, but deferred  4/25 s/p cholecystectomy along with significant lysis of adhesions on 4/25 here at [facility].   Significant Hospital Events: Including procedures, antibiotic start and stop dates in addition to other pertinent events   2/24 admitted for acute  cholecystitis,percutaneous cholecystostomy tube placed by IR  3/29 offered  a robot-assisted laparoscopic cholecystectomy, but deferred  4/25 s/p cholecystectomy along with significant lysis of adhesions on 4/25 here at [facility]. 5/12 admission to ICU for large abd abscess, jaundice and septic shock, on pressors 5/12 CT abd-Large abscess in the gallbladder fossa, at least 10 cm in size; Percutaneous drain placed by IR 5/13: Nephrology following, starting Bicarb gtt, remains on Levophed; Blood cultures growing Enterococcus faecalis,Bacteroides fragilis.  Pt extremely delirious and agitated, trying to leave to go home, interfering with medical treatment, plan to start Precedex. High risk for intubation due to severe Encephalopathy   Micro Data:  09/25/2020: SARS-CoV-2 PCR>> negative 09/25/2020: Influenza PCR>> negative 09/25/2020: Blood culture x2>>Enterococcus faecalis,Bacteroides fragilis 09/25/2020: MRSA PCR>> negative 09/25/2020: Aerobic/anaerobic culture from abdominal abscess>> moderate gram-negative coccobacilli (culture pending)  Antimicrobials:  Zosyn 5/12>>  Antibiotics Given (last 72 hours)    Date/Time Action Medication Dose Rate   09/25/20 1506 New Bag/Given   piperacillin-tazobactam (ZOSYN) IVPB 3.375 g 3.375 g 100 mL/hr   09/26/20 0106 New Bag/Given   piperacillin-tazobactam (ZOSYN) IVPB 3.375 g 3.375 g 12.5 mL/hr   09/26/20 [id number] New Bag/Given   piperacillin-tazobactam (ZOSYN) IVPB 3.375 g 3.375 g 12.5 mL/hr      Interval History:  -Pt with altered mental status this morning, very delirious and agitated, wanting to get up and go home, interfering with medical treatment, becoming aggressive with staff -Plan to initiate precedex -High risk for intubation due to severe metabolic encephalopathy -Afebrile, continues to require Levophed (currently at 5 mcg) -Nephrology following for AKI and metabolic acidosis, plan to start Bicarb gtt @ 75 ml/hr  Objective   Blood pressure 110/82,  pulse 73, temperature 98.2 F (36.8 C), temperature source Oral, resp. rate  17, height 6\' 4"  (1.93 m), weight 107 kg, SpO2 98 %.        Intake/Output Summary (Last 24 hours) at 09/26/2020 0927 Last data filed at 09/26/2020 0830 Gross per 24 hour  Intake 3569.35 ml  Output 280 ml  Net 3289.35 ml   Filed Weights   09/25/20 1438 09/25/20 1851 09/26/20 0500  Weight: 113 kg 109.5 kg 107 kg     REVIEW OF SYSTEMS Unable to assess due to Altered Mental Status   PHYSICAL EXAMINATION: GENERAL:critically ill appearing made, sitting on edge of bed, severely agitated and delirious HEAD: Normocephalic, atraumatic.  EYES: Pupils equal, round, reactive to light.  No scleral icterus.  MOUTH: Dry mucus membranes NECK: Supple. No thyromegaly PULMONARY: Clear breath sounds bilaterally, even, nonlabored CARDIOVASCULAR: S1 and S2. Regular rate and rhythm. No murmurs, rubs, or gallops. 2+ pulses GASTROINTESTINAL: Obese, Distended, nontender, no guarding or rebound tenderness.  Percutaneous drain to RUQ is clean dry and intact   MUSCULOSKELETAL: Normal bulk and tone, no deformities,No swelling, clubbing, or edema.  NEUROLOGIC: Awake, agitated, moves all extremities purposefully, no focal deficits, speech clear SKIN: Warm and dry.  No obvious rashes, lesions, or ulcerations   Labs/imaging that I havepersonally reviewed  (right click and "Reselect all SmartList Selections" daily)  Labs 09/26/2020: Bicarbonate 17, anion gap 20, glucose 148, BUN 51, creatinine 4.76, alkaline phosphatase 360, albumin 2.4, AST 133, ALT 156, total bili 6.7, WBC 45.5, hemoglobin 10.3, hematocrit 31.2, platelets 524 CT abdomen and pelvis 09/25/2020>>Large abscess in the gallbladder fossa, at least 10 cm in size. No evidence free intraperitoneal fluid or distant abscess/collection. Atelectasis in both lower lobes. Aortic Atherosclerosis CT head w/o contrast 09/26/2018>>No acute or significant finding.  Mild generalized volume  loss. Chest x-ray 09/25/2020>>Cardiac shadow is within normal limits. The lungs are hypoinflated with mild bibasilar atelectasis. No bony abnormality is seen   ASSESSMENT/PLAN   Septic Shock -Continuous cardiac monitoring -Maintain MAP greater than 65 -IV fluids -Vasopressors as needed to maintain MAP goal -Stress dose steroids -Add Midodrine -Trend lactic acid  Severe sepsis due to intra-abdominal abscess and BACTEREMIA (Enterococcus faecalis, Bacteroides fragilis) -Monitor fever curve -Trend WBCs and procalcitonin -Follow cultures as above -Continue Zosyn for now pending cultures and sensitivities -Given + blood cultures, will consult ID -Status post Percutaneous drain placement by IR into gallbladder fossa abscess on 09/25/2020 for source control -General surgery following, appreciate input  Acute Kidney Injury Anion Gap Metabolic Acidosis -Monitor I&O's / urinary output -Follow BMP -Ensure adequate renal perfusion -Avoid nephrotoxic agents as able -Replace electrolytes as indicated -IV fluids -Trend lactic acid -Nephrology following, appreciate input -Plan to start bicarb drip @ 75 ml/hr today 09/26/2020  Acute toxic metabolic encephalopathy, suspect in setting of Sepsis, AKI, and sleep deprivation -Provide supportive care -Encourage normal sleep/wake cycle -Treat severe sepsis -CT head 09/25/2020 negative -Resume home Ambien -Pt received fentanyl and versed per Dr. Mortimer Fries, remains severely delirious and agitated, interfering with medical treatment and high risk for fall ~ plan to start Precedex -High risk for intubation  -Check urine drug screen  Anemia without s/sx of overt blood loss -Monitor for S/Sx of bleeding -Trend CBC -SCD's for VTE Prophylaxis  -Transfuse for Hgb <7  Hyperglycemia -CBG's -SSI -Follow ICU Hypo/Hyperglycemia protocol.   Pt is critically ill with multiorgan failure.  Prognosis is guarded.  High risk for intubation due to severe metabolic  encephalopathy.  High risk for cardiac arrest and death.   Best practice (right click and "Reselect all SmartList  Selections" daily)  Diet: Oral as tolerated Pain/Anxiety/Delirium protocol (if indicated): Precedex VAP protocol (if indicated): NA DVT prophylaxis: SCD's GI prophylaxis: PPI Glucose control:  SSI Central venous access:  N/A Arterial line:  N/A Foley:  N/A Mobility:  bed rest  PT consulted: N/A Code Status:  full code Disposition: ICU  Pt's sister updated at bedside 09/26/20.  All questions answered.  Labs   CBC: Recent Labs  Lab 09/25/20 1444 09/26/20 0420  WBC 25.5* 45.5*  NEUTROABS 24.0*  --   HGB 11.5* 10.3*  HCT 34.1* 31.2*  MCV 80.4 81.9  PLT 535* 524*    Basic Metabolic Panel: Recent Labs  Lab 09/25/20 1444 09/25/20 1909 09/26/20 0420  NA 135  --  135  K 3.1*  --  3.9  CL 96*  --  98  CO2 17*  --  17*  GLUCOSE 84  --  148*  BUN 46*  --  51*  CREATININE 4.89*  --  4.76*  CALCIUM 8.5*  --  7.9*  MG  --  2.3 2.4  PHOS  --  5.1* 7.5*   GFR: Estimated Creatinine Clearance: 20.8 mL/min (A) (by C-G formula based on SCr of 4.76 mg/dL (H)). Recent Labs  Lab 09/25/20 1444 09/25/20 1502 09/25/20 1905 09/25/20 2151 09/26/20 0420  WBC 25.5*  --   --   --  45.5*  LATICACIDVEN  --  8.7*  6.5* 4.4* 3.1*  --     Liver Function Tests: Recent Labs  Lab 09/25/20 1444 09/26/20 0420  AST 121* 133*  ALT 56* 56*  ALKPHOS 459* 360*  BILITOT 9.5* 6.7*  PROT 8.2* 7.6  ALBUMIN 2.6* 2.4*   No results for input(s): LIPASE, AMYLASE in the last 168 hours. Recent Labs  Lab 09/25/20 1502  AMMONIA 19    ABG    Component Value Date/Time   HCO3 19.3 (L) 09/25/2020 2151   ACIDBASEDEF 5.7 (H) 09/25/2020 2151   O2SAT 73.2 09/25/2020 2151     Coagulation Profile: Recent Labs  Lab 09/25/20 1444  INR 1.5*    Cardiac Enzymes: No results for input(s): CKTOTAL, CKMB, CKMBINDEX, TROPONINI in the last 168 hours.  HbA1C: Hemoglobin A1C   Date/Time Value Ref Range Status  12/27/2019 12:00 AM 6.9  Final    Comment:    [facility]  03/09/2019 11:22 AM 5.6 4.0 - 5.6 % Final  12/06/2018 10:26 AM 6.7 (A) 4.0 - 5.6 % Final   Hgb A1c MFr Bld  Date/Time Value Ref Range Status  05/30/2020 10:47 AM 7.2 (H) <5.7 % of total Hgb Final    Comment:    For someone without known diabetes, a hemoglobin A1c value of 6.5% or greater indicates that they may have  diabetes and this should be confirmed with a follow-up  test. . For someone with known diabetes, a value <7% indicates  that their diabetes is well controlled and a value  greater than or equal to 7% indicates suboptimal  control. A1c targets should be individualized based on  duration of diabetes, age, comorbid conditions, and  other considerations. . Currently, no consensus exists regarding use of hemoglobin A1c for diagnosis of diabetes for children. .   11/24/2016 08:18 AM 6.4 (H) <5.7 % Final    Comment:      For someone without known diabetes, a hemoglobin A1c value between 5.7% and 6.4% is consistent with prediabetes and should be confirmed with a follow-up test.   For someone with known diabetes, a value <  7% indicates that their diabetes is well controlled. A1c targets should be individualized based on duration of diabetes, age, co-morbid conditions and other considerations.   This assay result is consistent with an increased risk of diabetes.   Currently, no consensus exists regarding use of hemoglobin A1c for diagnosis of diabetes in children.       CBG: Recent Labs  Lab 09/25/20 1439 09/25/20 1834 09/25/20 2232  GLUCAP 88 96 134*     Past Medical History:  He,  has a past medical history of Cancer ([place]), Erectile dysfunction associated with type 2 diabetes mellitus ([place]) (05/26/2016), Gross hematuria (11/16/2016), Hypertension (06/11/2015), and Type 2 diabetes mellitus with diabetic polyneuropathy, without long-term current use of insulin  ([place]) (06/11/2015).   Surgical History:   Past Surgical History:  Procedure Laterality Date  . boil lanceted     hand  . COLONOSCOPY WITH PROPOFOL N/A 07/03/2020   Procedure: COLONOSCOPY WITH PROPOFOL;  Surgeon: Jonathon Bellows, MD;  Location: [facility] ENDOSCOPY;  Service: Gastroenterology;  Laterality: N/A;  . CYSTOSCOPY W/ RETROGRADES Bilateral 12/06/2016   Procedure: CYSTOSCOPY WITH RETROGRADE PYELOGRAM;  Surgeon: Hollice Espy, MD;  Location: [facility] ORS;  Service: Urology;  Laterality: Bilateral;  . CYSTOSCOPY W/ RETROGRADES Bilateral 06/27/2017   Procedure: CYSTOSCOPY WITH RETROGRADE PYELOGRAM;  Surgeon: Hollice Espy, MD;  Location: [facility] ORS;  Service: Urology;  Laterality: Bilateral;  . CYSTOSCOPY WITH BIOPSY N/A 06/27/2017   Procedure: CYSTOSCOPY WITH Bladder BIOPSY;  Surgeon: Hollice Espy, MD;  Location: [facility] ORS;  Service: Urology;  Laterality: N/A;  . CYSTOSCOPY WITH BIOPSY N/A 10/24/2017   Procedure: CYSTOSCOPY WITH Bladder BIOPSY;  Surgeon: Hollice Espy, MD;  Location: [facility] ORS;  Service: Urology;  Laterality: N/A;  . ingrown  Bilateral    ingrown toenail  . IR BILIARY DRAIN PLACEMENT WITH CHOLANGIOGRAM  07/10/2020  . IR CHOLANGIOGRAM EXISTING TUBE  08/07/2020  . IR RADIOLOGIST EVAL & MGMT  08/07/2020  . TRANSURETHRAL RESECTION OF BLADDER TUMOR N/A 12/06/2016   Procedure: TRANSURETHRAL RESECTION OF BLADDER TUMOR (TURBT) (2-5cm) CLOT EVACUATION;  Surgeon: Hollice Espy, MD;  Location: [facility] ORS;  Service: Urology;  Laterality: N/A;  . TRANSURETHRAL RESECTION OF BLADDER TUMOR N/A 06/27/2017   Procedure: TRANSURETHRAL RESECTION OF BLADDER TUMOR (TURBT);  Surgeon: Hollice Espy, MD;  Location: [facility] ORS;  Service: Urology;  Laterality: N/A;  . TRANSURETHRAL RESECTION OF BLADDER TUMOR WITH MITOMYCIN-C N/A 01/18/2017   Procedure: TRANSURETHRAL RESECTION OF BLADDER TUMOR WITH MITOMYCIN-C-(SMALL);  Surgeon: Hollice Espy, MD;  Location: [facility] ORS;  Service: Urology;  Laterality: N/A;      Social History:   reports that he has never smoked. He has never used smokeless tobacco. He reports that he does not drink alcohol and does not use drugs.   Family History:  His family history includes Heart attack in his maternal aunt, maternal grandfather, maternal grandmother, and mother; Heart disease in his maternal grandfather and mother; Parkinson's disease in his father. There is no history of Prostate cancer, Bladder Cancer, or Kidney cancer.   Allergies Allergies  Allergen Reactions  . Cyclobenzaprine Other (See Comments)    Not improving pain and makes him agressive.  . Lisinopril Cough     Home Medications  Prior to Admission medications   Medication Sig Start Date End Date Taking? Authorizing Provider  ibuprofen (ADVIL) 400 MG tablet Take 1 tablet (400 mg total) by mouth every 6 (six) hours as needed. 09/09/20  Yes Edison Simon R, PA-C  metoprolol tartrate (LOPRESSOR) 25 MG tablet Take 0.5 tablets (  12.5 mg total) by mouth 2 (two) times daily. 09/19/20  Yes Karamalegos, Devonne Doughty, DO  oxyCODONE (OXY IR/ROXICODONE) 5 MG immediate release tablet Take 1 tablet (5 mg total) by mouth every 6 (six) hours as needed for severe pain or breakthrough pain. 09/09/20  Yes Edison Simon R, PA-C  pravastatin (PRAVACHOL) 20 MG tablet Take 1 tablet (20 mg total) by mouth daily. 05/30/20  Yes Karamalegos, Devonne Doughty, DO  zolpidem (AMBIEN) 5 MG tablet Take 1 tablet (5 mg total) by mouth at bedtime as needed for sleep. 08/19/20  Yes Karamalegos, Devonne Doughty, DO  acetaminophen (TYLENOL) 325 MG tablet Take 1 tablet (325 mg total) by mouth every 6 (six) hours as needed for mild pain, fever or headache (or Fever >/= 101). Patient not taking: No sig reported 07/12/20   Cherene Altes, MD  diphenhydramine-acetaminophen (TYLENOL PM) 25-500 MG TABS tablet Take 1 tablet by mouth at bedtime as needed. Patient not taking: No sig reported    [provider]  OZEMPIC, 0.25 OR 0.5 MG/DOSE, 2  MG/1.5ML SOPN Inject 1 mg into the skin once a week. Patient taking differently: Inject 1 mg into the skin every Thursday. 05/30/20   [name], DO     Critical Care Time: 40 minutes    Darel Hong, AGACNP-BC [facility] Prefer epic messenger for cross cover needs If after hours, please call E-link

## 2020-09-26 NOTE — Progress Notes (Signed)
Picked patient up at 14:45. Patient resting, Rass -3. Patient on 2 liters. RUQ JP drain intact. No additional output during my shift. Foley intact. Levophed titrated to maintain map goal. Family at bedside continue to assess.

## 2020-09-26 NOTE — Progress Notes (Addendum)
[place] Hospital Day(s): 1.  Interval History:  Patient seen and examined; requiring 7 mcg/min Levophed Overnight, IR placed percutaneous drain in gallbladder fossa abscess, output purulent His leukocytosis is significantly worse to 45.5K; likely bumped secondary to procedure yesterday Renal function remains markedly elevated; scr - 4.76; UO - unmeasured Hyperbilirubinemia improved; bilirubin down to 6.7; likely secondary to CBD compression from abscess Cx from drain growing gram negative coccobacilli He continues on Zosyn On a regular diet; + bowel function  Vital signs in last 24 hours: [min-max] current  Temp:  [98.1 F (36.7 C)-99 F (37.2 C)] 98.2 F (36.8 C) (05/13 0430) Pulse Rate:  [69-101] 70 (05/13 0700) Resp:  [12-27] 19 (05/13 0700) BP: (58-138)/(48-87) 86/57 (05/13 0700) SpO2:  [92 %-100 %] 94 % (05/13 0700) Weight:  [107 kg-113 kg] 107 kg (05/13 0500)     Height: 6\' 4"  (193 cm) Weight: 107 kg BMI (Calculated): 28.73   Intake/Output last 2 shifts:  05/12 0701 - 05/13 0700 In: 3569.4 [IV Piggyback:3569.4] Out: 280 [Drains:280]   Physical Exam:  Constitutional: alert, cooperative and no distress  HENT: normocephalic without obvious abnormality  Eyes: PERRL, EOM's grossly intact and symmetric  Respiratory: breathing non-labored at rest  Cardiovascular: regular rate and sinus rhythm  Gastrointestinal: Soft, non-tender, non-distended, no rebound/guarindg. Newly placed percutaneous drain in the RUQ Musculoskeletal: no edema or wounds, motor and sensation grossly intact, NT    Labs:  CBC Latest Ref Rng & Units 09/26/2020 09/25/2020 09/09/2020  WBC 4.0 - 10.5 K/uL 45.5(H) 25.5(H) 12.8(H)  Hemoglobin 13.0 - 17.0 g/dL 10.3(L) 11.5(L) 12.8(L)  Hematocrit 39.0 - 52.0 % 31.2(L) 34.1(L) 38.1(L)  Platelets 150 - 400 K/uL 524(H) 535(H) 381   CMP Latest Ref Rng & Units 09/26/2020 09/25/2020 09/09/2020  Glucose 70 - 99 mg/dL 148(H) 84  154(H)  BUN 8 - 23 mg/dL 51(H) 46(H) 21  Creatinine 0.61 - 1.24 mg/dL 4.76(H) 4.89(H) 1.49(H)  Sodium 135 - 145 mmol/L 135 135 136  Potassium 3.5 - 5.1 mmol/L 3.9 3.1(L) 4.5  Chloride 98 - 111 mmol/L 98 96(L) 104  CO2 22 - 32 mmol/L 17(L) 17(L) 21(L)  Calcium 8.9 - 10.3 mg/dL 7.9(L) 8.5(L) 8.3(L)  Total Protein 6.5 - 8.1 g/dL 7.6 8.2(H) 7.2  Total Bilirubin 0.3 - 1.2 mg/dL 6.7(H) 9.5(H) 0.6  Alkaline Phos 38 - 126 U/L 360(H) 459(H) 154(H)  AST 15 - 41 U/L 133(H) 121(H) 60(H)  ALT 0 - 44 U/L 56(H) 56(H) 46(H)    Imaging studies: No new pertinent imaging studies   Assessment/Plan:  65 y.o. male with sepsis secondary to large gallbladder fossa abscess 18 days s/p robotic assisted laparoscopic cholecystectomy for severe cholecystitis    - Continue percutaneous drain; monitor and record output  - Continue IV ABx (Zosyn); follow up Cx  - Okay for regular diet   - Monitor abdominal examination  - Monitor leukocytosis; worse today likely secondary to procedure last night  - Monitor renal function  - Monitor hyperbilirubinemia; improved; likely related to CBD compression  - Pain control prn; antiemetics prn  - Mobilization as feasible   - Appreciate PCCM assistance; vasopressor per their service   All of the above findings and recommendations were discussed with the patient, and the medical team, and all of patient's questions were answered to his expressed satisfaction.  -- Edison Simon, PA-C [facility] 09/26/2020, 7:33 AM [phone] M-F: 7am - 4pm   I saw and evaluated the patient.  I agree  with the above documentation, exam, and plan, which I have edited where appropriate. [name]  10:23 AM

## 2020-09-27 ENCOUNTER — Inpatient Hospital Stay: Payer: Medicare Other

## 2020-09-27 ENCOUNTER — Inpatient Hospital Stay (HOSPITAL_COMMUNITY)
Admit: 2020-09-27 | Discharge: 2020-09-27 | Disposition: A | Discharge status: Home / Self Care | Payer: Medicare Other | Attending: Infectious Diseases | Admitting: Infectious Diseases

## 2020-09-27 ENCOUNTER — Inpatient Hospital Stay: Admit: 2020-09-27 | Payer: Medicare Other

## 2020-09-27 DIAGNOSIS — N179 Acute kidney failure, unspecified: Secondary | ICD-10-CM

## 2020-09-27 DIAGNOSIS — R7881 Bacteremia: Secondary | ICD-10-CM | POA: Diagnosis not present

## 2020-09-27 DIAGNOSIS — R6520 Severe sepsis without septic shock: Secondary | ICD-10-CM

## 2020-09-27 DIAGNOSIS — N170 Acute kidney failure with tubular necrosis: Secondary | ICD-10-CM

## 2020-09-27 DIAGNOSIS — A419 Sepsis, unspecified organism: Secondary | ICD-10-CM | POA: Diagnosis not present

## 2020-09-27 DIAGNOSIS — A4189 Other specified sepsis: Secondary | ICD-10-CM | POA: Diagnosis not present

## 2020-09-27 DIAGNOSIS — L0291 Cutaneous abscess, unspecified: Secondary | ICD-10-CM

## 2020-09-27 DIAGNOSIS — N171 Acute kidney failure with acute cortical necrosis: Secondary | ICD-10-CM

## 2020-09-27 DIAGNOSIS — G9340 Encephalopathy, unspecified: Secondary | ICD-10-CM

## 2020-09-27 DIAGNOSIS — B9689 Other specified bacterial agents as the cause of diseases classified elsewhere: Secondary | ICD-10-CM | POA: Diagnosis not present

## 2020-09-27 DIAGNOSIS — R6521 Severe sepsis with septic shock: Secondary | ICD-10-CM | POA: Diagnosis not present

## 2020-09-27 DIAGNOSIS — A4181 Sepsis due to Enterococcus: Secondary | ICD-10-CM | POA: Diagnosis not present

## 2020-09-27 DIAGNOSIS — R4182 Altered mental status, unspecified: Secondary | ICD-10-CM

## 2020-09-27 LAB — CBC WITH DIFFERENTIAL/PLATELET
Abs Immature Granulocytes: 0.87 10*3/uL — ABNORMAL HIGH (ref 0.00–0.07)
Basophils Absolute: 0.1 10*3/uL (ref 0.0–0.1)
Basophils Relative: 0 %
Eosinophils Absolute: 0 10*3/uL (ref 0.0–0.5)
Eosinophils Relative: 0 %
HCT: 30.7 % — ABNORMAL LOW (ref 39.0–52.0)
Hemoglobin: 10.7 g/dL — ABNORMAL LOW (ref 13.0–17.0)
Immature Granulocytes: 3 %
Lymphocytes Relative: 4 %
Lymphs Abs: 1.2 10*3/uL (ref 0.7–4.0)
MCH: 27.2 pg (ref 26.0–34.0)
MCHC: 34.9 g/dL (ref 30.0–36.0)
MCV: 77.9 fL — ABNORMAL LOW (ref 80.0–100.0)
Monocytes Absolute: 1.1 10*3/uL — ABNORMAL HIGH (ref 0.1–1.0)
Monocytes Relative: 4 %
Neutro Abs: 26 10*3/uL — ABNORMAL HIGH (ref 1.7–7.7)
Neutrophils Relative %: 89 %
Platelets: 465 10*3/uL — ABNORMAL HIGH (ref 150–400)
RBC: 3.94 MIL/uL — ABNORMAL LOW (ref 4.22–5.81)
RDW: 15.2 % (ref 11.5–15.5)
Smear Review: NORMAL
WBC: 29.2 10*3/uL — ABNORMAL HIGH (ref 4.0–10.5)
nRBC: 0 % (ref 0.0–0.2)

## 2020-09-27 LAB — COMPREHENSIVE METABOLIC PANEL
ALT: 48 U/L — ABNORMAL HIGH (ref 0–44)
AST: 100 U/L — ABNORMAL HIGH (ref 15–41)
Albumin: 2 g/dL — ABNORMAL LOW (ref 3.5–5.0)
Alkaline Phosphatase: 279 U/L — ABNORMAL HIGH (ref 38–126)
Anion gap: 17 — ABNORMAL HIGH (ref 5–15)
BUN: 74 mg/dL — ABNORMAL HIGH (ref 8–23)
CO2: 19 mmol/L — ABNORMAL LOW (ref 22–32)
Calcium: 6.8 mg/dL — ABNORMAL LOW (ref 8.9–10.3)
Chloride: 95 mmol/L — ABNORMAL LOW (ref 98–111)
Creatinine, Ser: 5.42 mg/dL — ABNORMAL HIGH (ref 0.61–1.24)
GFR, Estimated: 11 mL/min — ABNORMAL LOW (ref >60)
Glucose, Bld: 241 mg/dL — ABNORMAL HIGH (ref 70–99)
Potassium: 4.6 mmol/L (ref 3.5–5.1)
Sodium: 131 mmol/L — ABNORMAL LOW (ref 135–145)
Total Bilirubin: 3.9 mg/dL — ABNORMAL HIGH (ref 0.3–1.2)
Total Protein: 6.8 g/dL (ref 6.5–8.1)

## 2020-09-27 LAB — BLOOD GAS, ARTERIAL
Acid-base deficit: 2.3 mmol/L — ABNORMAL HIGH (ref 0.0–2.0)
Bicarbonate: 23.2 mmol/L (ref 20.0–28.0)
FIO2: 0.5
MECHVT: 500 mL
O2 Saturation: 93.1 %
PEEP: 5 cmH2O
Patient temperature: 37
RATE: 16 resp/min
pCO2 arterial: 42 mmHg (ref 32.0–48.0)
pH, Arterial: 7.35 (ref 7.350–7.450)
pO2, Arterial: 71 mmHg — ABNORMAL LOW (ref 83.0–108.0)

## 2020-09-27 LAB — GLUCOSE, CAPILLARY
Glucose-Capillary: 223 mg/dL — ABNORMAL HIGH (ref 70–99)
Glucose-Capillary: 229 mg/dL — ABNORMAL HIGH (ref 70–99)
Glucose-Capillary: 238 mg/dL — ABNORMAL HIGH (ref 70–99)
Glucose-Capillary: 248 mg/dL — ABNORMAL HIGH (ref 70–99)
Glucose-Capillary: 265 mg/dL — ABNORMAL HIGH (ref 70–99)

## 2020-09-27 LAB — MAGNESIUM: Magnesium: 2.5 mg/dL — ABNORMAL HIGH (ref 1.7–2.4)

## 2020-09-27 LAB — PHOSPHORUS: Phosphorus: 8.4 mg/dL — ABNORMAL HIGH (ref 2.5–4.6)

## 2020-09-27 LAB — PROCALCITONIN: Procalcitonin: 99.94 ng/mL

## 2020-09-27 MED ORDER — MIDAZOLAM HCL 2 MG/2ML IJ SOLN
INTRAMUSCULAR | Status: AC
  Filled 2020-09-27: qty 4

## 2020-09-27 MED ORDER — FENTANYL 2500MCG IN NS 250ML (10MCG/ML) PREMIX INFUSION
0.0000 ug/h | INTRAVENOUS | Status: DC
  Administered 2020-09-27: 100 ug/h via INTRAVENOUS
  Administered 2020-09-27 – 2020-09-28 (×2): 200 ug/h via INTRAVENOUS
  Administered 2020-09-29: 150 ug/h via INTRAVENOUS
  Administered 2020-09-30: 75 ug/h via INTRAVENOUS
  Filled 2020-09-27 (×4): qty 250

## 2020-09-27 MED ORDER — MIDAZOLAM HCL 2 MG/2ML IJ SOLN
4.0000 mg | Freq: Once | INTRAMUSCULAR | Status: AC
  Administered 2020-09-27: 4 mg via INTRAVENOUS

## 2020-09-27 MED ORDER — POLYETHYLENE GLYCOL 3350 17 G PO PACK
17.0000 g | PACK | Freq: Every day | ORAL | Status: DC
  Administered 2020-09-30 – 2020-10-07 (×3): 17 g
  Filled 2020-09-27 (×5): qty 1

## 2020-09-27 MED ORDER — VECURONIUM BROMIDE 10 MG IV SOLR
10.0000 mg | Freq: Once | INTRAVENOUS | Status: AC
  Administered 2020-09-27: 10 mg via INTRAVENOUS

## 2020-09-27 MED ORDER — CHLORHEXIDINE GLUCONATE 0.12% ORAL RINSE (MEDLINE KIT)
15.0000 mL | Freq: Two times a day (BID) | OROMUCOSAL | Status: DC
  Administered 2020-09-27 – 2020-10-07 (×20): 15 mL via OROMUCOSAL

## 2020-09-27 MED ORDER — INSULIN ASPART 100 UNIT/ML IJ SOLN
0.0000 [IU] | INTRAMUSCULAR | Status: DC
  Administered 2020-09-27 (×2): 5 [IU] via SUBCUTANEOUS
  Filled 2020-09-27 (×2): qty 1

## 2020-09-27 MED ORDER — VECURONIUM BROMIDE 10 MG IV SOLR
INTRAVENOUS | Status: AC
  Filled 2020-09-27: qty 10

## 2020-09-27 MED ORDER — MIDAZOLAM 50MG/50ML (1MG/ML) PREMIX INFUSION: 0.5000 mg/h | INTRAVENOUS | Status: DC

## 2020-09-27 MED ORDER — SODIUM CHLORIDE 0.9 % IV SOLN
2.2500 g | Freq: Four times a day (QID) | INTRAVENOUS | Status: DC
  Administered 2020-09-27 – 2020-09-28 (×4): 2.25 g via INTRAVENOUS
  Filled 2020-09-27 (×2): qty 2.25
  Filled 2020-09-27 (×2): qty 10
  Filled 2020-09-27: qty 2.25
  Filled 2020-09-27 (×2): qty 10

## 2020-09-27 MED ORDER — FENTANYL 2500MCG IN NS 250ML (10MCG/ML) PREMIX INFUSION
INTRAVENOUS | Status: AC
  Filled 2020-09-27: qty 250

## 2020-09-27 MED ORDER — PROPOFOL 1000 MG/100ML IV EMUL
INTRAVENOUS | Status: AC
  Filled 2020-09-27: qty 100

## 2020-09-27 MED ORDER — FENTANYL CITRATE (PF) 100 MCG/2ML IJ SOLN
INTRAMUSCULAR | Status: AC
  Filled 2020-09-27: qty 4

## 2020-09-27 MED ORDER — ORAL CARE MOUTH RINSE
15.0000 mL | OROMUCOSAL | Status: DC
  Administered 2020-09-27 – 2020-10-07 (×92): 15 mL via OROMUCOSAL

## 2020-09-27 MED ORDER — FENTANYL CITRATE (PF) 100 MCG/2ML IJ SOLN
200.0000 ug | Freq: Once | INTRAMUSCULAR | Status: AC
  Administered 2020-09-27: 200 ug via INTRAVENOUS

## 2020-09-27 MED ORDER — DOCUSATE SODIUM 50 MG/5ML PO LIQD
100.0000 mg | Freq: Two times a day (BID) | ORAL | Status: DC
  Administered 2020-09-28 – 2020-10-01 (×6): 100 mg
  Filled 2020-09-27 (×9): qty 10

## 2020-09-27 MED ORDER — HEPARIN SODIUM (PORCINE) 1000 UNIT/ML IJ SOLN
2800.0000 [IU] | Freq: Once | INTRAMUSCULAR | Status: AC
  Administered 2020-09-27: 2800 [IU]

## 2020-09-27 MED ORDER — PROPOFOL 1000 MG/100ML IV EMUL
5.0000 ug/kg/min | INTRAVENOUS | Status: DC
  Administered 2020-09-27 (×2): 40 ug/kg/min via INTRAVENOUS
  Administered 2020-09-27: 20 ug/kg/min via INTRAVENOUS
  Administered 2020-09-27 – 2020-09-29 (×8): 40 ug/kg/min via INTRAVENOUS
  Administered 2020-09-29: 10 ug/kg/min via INTRAVENOUS
  Administered 2020-09-29 (×2): 40 ug/kg/min via INTRAVENOUS
  Administered 2020-09-29: 30 ug/kg/min via INTRAVENOUS
  Administered 2020-09-30: 10 ug/kg/min via INTRAVENOUS
  Filled 2020-09-27 (×15): qty 100

## 2020-09-27 NOTE — Progress Notes (Signed)
Dr. Mortimer Fries and Benjamine Mola, NP at bedside placing trialysis cath.

## 2020-09-27 NOTE — Progress Notes (Signed)
Patients ET tube advanced to 24cm at the lip from 23cm per MD request and CXR.

## 2020-09-27 NOTE — Progress Notes (Signed)
09/27/2020  Subjective: Patient has been agitated overnight and has required Precedex with increasing doses.  However given the increasing doses, he is also been having oxygen desaturation and there is concerns that he may need to get intubated.  He has been afebrile and has been weaned off vasopressors.  However his renal function continues to deteriorate and per RN report, he will be starting dialysis today.  His WBC has gone down to 29.2 from 45.5 yesterday.  Blood cultures from admission growing Enterococcus faecalis as well as Bacteroides fragilis and his right upper quadrant abscess drain cultures is growing abundant Enterococcus faecalis as well.  He is currently on Zosyn.  Vital signs: Temp:  [98.2 F (36.8 C)-98.6 F (37 C)] 98.6 F (37 C) (05/14 0800) Pulse Rate:  [54-70] 54 (05/14 1000) Resp:  [15-24] 24 (05/14 1000) BP: (75-127)/(59-89) 109/81 (05/14 1000) SpO2:  [91 %-98 %] 97 % (05/14 1000)   Intake/Output: 05/13 0701 - 05/14 0700 In: 2171.9 [P.O.:120; I.V.:1902; IV Piggyback:149.9] Out: 1000 [Urine:965; Drains:35] Last BM Date: 09/26/20  Physical Exam: Constitutional: Delirious, unable to participate in conversation or exam in a coherent way Cardiac: Regular rhythm and rate Pulm: Intermittent desaturation but does not appear to be in respiratory distress Abdomen: Soft, obese, mildly distended.  Right-sided percutaneous drain with purulent fluid in bulb.  Labs:  Recent Labs    09/26/20 0420 09/27/20 0416  WBC 45.5* 29.2*  HGB 10.3* 10.7*  HCT 31.2* 30.7*  PLT 524* 465*   Recent Labs    09/26/20 0420 09/27/20 0416  NA 135 131*  K 3.9 4.6  CL 98 95*  CO2 17* 19*  GLUCOSE 148* 241*  BUN 51* 74*  CREATININE 4.76* 5.42*  CALCIUM 7.9* 6.8*   Recent Labs    09/25/20 1444  LABPROT 17.7*  INR 1.5*    Imaging: US RENAL  Result Date: 09/26/2020 CLINICAL DATA:  Acute kidney failure EXAM: RENAL / URINARY TRACT ULTRASOUND COMPLETE COMPARISON:  CT abdomen  pelvis 09/25/2020 FINDINGS: Right Kidney: Renal measurements: 13.8 x 6.3 x 5.9 cm = volume: 270 mL. Echogenicity within normal limits. No mass or hydronephrosis visualized. Left Kidney: Renal measurements: 12.9 x 7.1 x 5.3 cm = volume: 250 mL. Echogenicity within normal limits. No hydronephrosis. There is a small cyst in the superior pole measuring 2.6 cm. Bladder: Decompressed with Foley catheter in place. Other: None. IMPRESSION: No acute finding sonographically in the bilateral kidneys. Electronically Signed   By: Audie Pinto M.D.   On: 09/26/2020 14:13    Assessment/Plan: This is a 65 y.o. male with severe sepsis with bacteremia, AKI, encephalopathy and large right upper quadrant fluid collection status post robotic cholecystectomy.  - Had discussion with the patient's family at bedside regarding the patient's medical status and his encephalopathy.  Discussed with them that given his agitation and delirium, increasing doses of Precedex had been needed and this may lead to him being intubated because of the sedation.  His WBC has improved but his kidney function continues to deteriorate and he will be started on dialysis today.  At least his norepinephrine has been weaned off.  We will keep him n.p.o. for now given the high risk for aspiration with his delirium and continue IV antibiotics for his right upper quadrant abscess.  His LFTs are improving and they were likely elevated due to compression effect from the large right upper quadrant fluid collection.  Face-to-face time spent with the patient and care providers was 35 minutes, with more than  50% of the time spent counseling, educating, and coordinating care of the patient.      Melvyn Neth, [facility]

## 2020-09-27 NOTE — Progress Notes (Signed)
[place], [place] 09/27/20  Subjective:   Hospital day # 2  Patient is critically ill tachypneic today brother and sister are at bedside Foley was placed by ICU team- dark yellow urine noted  Renal: 05/13 0701 - 05/14 0700 In: 2171.9 [P.O.:120; I.V.:1902; IV Piggyback:149.9] Out: 1000 [Urine:965; Drains:35] Lab Results  Component Value Date   CREATININE 5.42 (H) 09/27/2020   CREATININE 4.76 (H) 09/26/2020   CREATININE 4.89 (H) 09/25/2020     Objective:  Vital signs in last 24 hours:  Temp:  [98.2 F (36.8 C)-98.6 F (37 C)] 98.6 F (37 C) (05/14 0800) Pulse Rate:  [60-87] 64 (05/14 0800) Resp:  [14-24] 18 (05/14 0800) BP: (75-127)/(59-89) 93/72 (05/14 0800) SpO2:  [89 %-98 %] 95 % (05/14 0800)  Weight change:  Filed Weights   09/25/20 1438 09/25/20 1851 09/26/20 0500  Weight: 113 kg 109.5 kg 107 kg    Intake/Output:    Intake/Output Summary (Last 24 hours) at 09/27/2020 0840 Last data filed at 09/27/2020 0810 Gross per 24 hour  Intake 2590.84 ml  Output 1070 ml  Net 1520.84 ml    Physical Exam: General:  No acute distress, laying in the bed  HEENT  dry oral mucous membrane  Pulm/lungs  tachypneic, lungs are clear to auscultation  CVS/Heart  regular rhythm, no rub or gallop  Abdomen:   Soft, nontender  Extremities:  No peripheral edema  Neurologic:  sedated  Skin:  No acute rashes   foley in place   Basic Metabolic Panel:  Recent Labs  Lab 09/25/20 1444 09/25/20 1909 09/26/20 0420 09/27/20 0416  NA 135  --  135 131*  K 3.1*  --  3.9 4.6  CL 96*  --  98 95*  CO2 17*  --  17* 19*  GLUCOSE 84  --  148* 241*  BUN 46*  --  51* 74*  CREATININE 4.89*  --  4.76* 5.42*  CALCIUM 8.5*  --  7.9* 6.8*  MG  --  2.3 2.4 2.5*  PHOS  --  5.1* 7.5* 8.4*     CBC: Recent Labs  Lab 09/25/20 1444 09/26/20 0420 09/27/20 0416  WBC 25.5* 45.5* 29.2*  NEUTROABS 24.0*  --  26.0*  HGB 11.5* 10.3* 10.7*  HCT 34.1* 31.2*  30.7*  MCV 80.4 81.9 77.9*  PLT 535* 524* 465*      Lab Results  Component Value Date   HEPBSAG NON REACTIVE 09/26/2020   HEPBSAB NON REACTIVE 09/26/2020   HEPBIGM NON REACTIVE 09/26/2020      Microbiology:  Recent Results (from the past 240 hour(s))  Culture, blood (routine x 2)     Status: None (Preliminary result)   Collection Time: 09/25/20  2:41 PM   Specimen: BLOOD  Result Value Ref Range Status   Specimen Description BLOOD RIGHT ANTECUBITAL  Final   Special Requests   Final    BOTTLES DRAWN AEROBIC AND ANAEROBIC Blood Culture results may not be optimal due to an excessive volume of blood received in culture bottles   Culture  Setup Time   Final    Organism ID to follow GRAM POSITIVE COCCI IN BOTH AEROBIC AND ANAEROBIC BOTTLES CRITICAL RESULT CALLED TO, READ BACK BY AND VERIFIED WITH: LISA KLUTTZ @0802  ON 09/26/20.SH Performed at [facility], [address] [place], [place] [address]    Culture [facility] POSITIVE COCCI  Final   Report Status PENDING  Incomplete  Blood Culture ID Panel (Reflexed)  Status: Abnormal   Collection Time: 09/25/20  2:41 PM  Result Value Ref Range Status   Enterococcus faecalis DETECTED (A) NOT DETECTED Final    Comment: CRITICAL RESULT CALLED TO, READ BACK BY AND VERIFIED WITH: LISA KLUTTZ @0802  ON 09/26/20.SH    Enterococcus Faecium NOT DETECTED NOT DETECTED Final   Listeria monocytogenes NOT DETECTED NOT DETECTED Final   Staphylococcus species NOT DETECTED NOT DETECTED Final   Staphylococcus aureus (BCID) NOT DETECTED NOT DETECTED Final   Staphylococcus epidermidis NOT DETECTED NOT DETECTED Final   Staphylococcus lugdunensis NOT DETECTED NOT DETECTED Final   Streptococcus species NOT DETECTED NOT DETECTED Final   Streptococcus agalactiae NOT DETECTED NOT DETECTED Final   Streptococcus pneumoniae NOT DETECTED NOT DETECTED Final   Streptococcus pyogenes NOT DETECTED NOT DETECTED Final   A.calcoaceticus-baumannii NOT  DETECTED NOT DETECTED Final   Bacteroides fragilis DETECTED (A) NOT DETECTED Final    Comment: CRITICAL RESULT CALLED TO, READ BACK BY AND VERIFIED WITH: LISA KLUTTZ @0802  ON 09/26/20.[place]    Enterobacterales NOT DETECTED NOT DETECTED Final   Enterobacter cloacae complex NOT DETECTED NOT DETECTED Final   Escherichia coli NOT DETECTED NOT DETECTED Final   Klebsiella aerogenes NOT DETECTED NOT DETECTED Final   Klebsiella oxytoca NOT DETECTED NOT DETECTED Final   Klebsiella pneumoniae NOT DETECTED NOT DETECTED Final   Proteus species NOT DETECTED NOT DETECTED Final   Salmonella species NOT DETECTED NOT DETECTED Final   Serratia marcescens NOT DETECTED NOT DETECTED Final   Haemophilus influenzae NOT DETECTED NOT DETECTED Final   Neisseria meningitidis NOT DETECTED NOT DETECTED Final   Pseudomonas aeruginosa NOT DETECTED NOT DETECTED Final   Stenotrophomonas maltophilia NOT DETECTED NOT DETECTED Final   Candida albicans NOT DETECTED NOT DETECTED Final   Candida auris NOT DETECTED NOT DETECTED Final   Candida glabrata NOT DETECTED NOT DETECTED Final   Candida krusei NOT DETECTED NOT DETECTED Final   Candida parapsilosis NOT DETECTED NOT DETECTED Final   Candida tropicalis NOT DETECTED NOT DETECTED Final   Cryptococcus neoformans/gattii NOT DETECTED NOT DETECTED Final   Vancomycin resistance NOT DETECTED NOT DETECTED Final    Comment: Performed at [facility], [address] [place], [place] [address]  Culture, blood (routine x 2)     Status: None (Preliminary result)   Collection Time: 09/25/20  3:42 PM   Specimen: BLOOD  Result Value Ref Range Status   Specimen Description BLOOD BLOOD LEFT HAND  Final   Special Requests   Final    BOTTLES DRAWN AEROBIC AND ANAEROBIC Blood Culture adequate volume   Culture   Final    NO GROWTH < 24 HOURS Performed at [facility], [place] [place], [place] [address]    Report Status PENDING  Incomplete  Resp Panel by RT-PCR  (Flu A&B, Covid) Nasopharyngeal Swab     Status: None   Collection Time: 09/25/20  3:51 PM   Specimen: Nasopharyngeal Swab; Nasopharyngeal(NP) swabs in vial transport medium  Result Value Ref Range Status   SARS Coronavirus 2 by RT PCR NEGATIVE NEGATIVE Final    Comment: (NOTE) SARS-CoV-2 target nucleic acids are NOT DETECTED.  The SARS-CoV-2 RNA is generally detectable in upper respiratory specimens during the acute phase of infection. The lowest concentration of SARS-CoV-2 viral copies this assay can detect is 138 copies/mL. A negative result does not preclude SARS-Cov-2 infection and should not be used as the sole basis for treatment or other patient management decisions. A negative result may occur with  improper specimen collection/handling, submission of specimen other than nasopharyngeal swab, presence of viral mutation(s) within the areas targeted by this assay, and inadequate number of viral copies(<138 copies/mL). A negative result must be combined with clinical observations, patient history, and epidemiological information. The expected result is Negative.  Fact Sheet for Patients:  EntrepreneurPulse.com.au  Fact Sheet for Healthcare Providers:  IncredibleEmployment.be  This test is no t yet approved or cleared by the [place] FDA and  has been authorized for detection and/or diagnosis of SARS-CoV-2 by FDA under an Emergency Use Authorization (EUA). This EUA will remain  in effect (meaning this test can be used) for the duration of the COVID-19 declaration under Section 564(b)(1) of the Act, 21 U.S.C.section 360bbb-3(b)(1), unless the authorization is terminated  or revoked sooner.       Influenza A by PCR NEGATIVE NEGATIVE Final   Influenza B by PCR NEGATIVE NEGATIVE Final    Comment: (NOTE) The Xpert Xpress SARS-CoV-2/FLU/RSV plus assay is intended as an aid in the diagnosis of influenza from Nasopharyngeal swab specimens  and should not be used as a sole basis for treatment. Nasal washings and aspirates are unacceptable for Xpert Xpress SARS-CoV-2/FLU/RSV testing.  Fact Sheet for Patients: EntrepreneurPulse.com.au  Fact Sheet for Healthcare Providers: IncredibleEmployment.be  This test is not yet approved or cleared by the [place] FDA and has been authorized for detection and/or diagnosis of SARS-CoV-2 by FDA under an Emergency Use Authorization (EUA). This EUA will remain in effect (meaning this test can be used) for the duration of the COVID-19 declaration under Section 564(b)(1) of the Act, 21 U.S.C. section 360bbb-3(b)(1), unless the authorization is terminated or revoked.  Performed at [facility], [address] [place], [place] [address]   MRSA PCR Screening     Status: None   Collection Time: 09/25/20  6:44 PM   Specimen: Nasopharyngeal  Result Value Ref Range Status   MRSA by PCR NEGATIVE NEGATIVE Final    Comment:        The GeneXpert MRSA Assay (FDA approved for NASAL specimens only), is one component of a comprehensive MRSA colonization surveillance program. It is not intended to diagnose MRSA infection nor to guide or monitor treatment for MRSA infections. Performed at [facility], [address] [place], [place] [address]   Aerobic/Anaerobic Culture (surgical/deep wound)     Status: None (Preliminary result)   Collection Time: 09/25/20  9:02 PM   Specimen: Abscess  Result Value Ref Range Status   Specimen Description   Final    ABSCESS Performed at [facility], [address] [place], [place] [address]    Special Requests   Final    Normal Performed at [facility], [address] [place], [place] [address]    Gram Stain   Final    ABUNDANT WBC PRESENT,BOTH PMN AND MONONUCLEAR MODERATE GRAM NEGATIVE COCCOBACILLI    Culture   Final    ABUNDANT ENTEROCOCCUS  FAECALIS SUSCEPTIBILITIES TO FOLLOW Performed at [facility], [place] [address], [place],  [address]    Report Status PENDING  Incomplete    Coagulation Studies: Recent Labs    09/25/20 1444  LABPROT 17.7*  INR 1.5*    Urinalysis: Recent Labs    09/26/20 1336  COLORURINE RED*  LABSPEC 1.020  PHURINE TEST NOT REPORTED DUE TO COLOR INTERFERENCE OF URINE PIGMENT  GLUCOSEU TEST NOT REPORTED DUE TO COLOR INTERFERENCE OF URINE PIGMENT*  HGBUR TEST NOT REPORTED DUE TO COLOR  INTERFERENCE OF URINE PIGMENT*  BILIRUBINUR TEST NOT REPORTED DUE TO COLOR INTERFERENCE OF URINE PIGMENT*  KETONESUR TEST NOT REPORTED DUE TO COLOR INTERFERENCE OF URINE PIGMENT*  PROTEINUR TEST NOT REPORTED DUE TO COLOR INTERFERENCE OF URINE PIGMENT*  NITRITE TEST NOT REPORTED DUE TO COLOR INTERFERENCE OF URINE PIGMENT*  LEUKOCYTESUR TEST NOT REPORTED DUE TO COLOR INTERFERENCE OF URINE PIGMENT*      Imaging: CT ABDOMEN PELVIS WO CONTRAST  Result Date: 09/25/2020 CLINICAL DATA:  Slurred speech. Balance disturbance. Recent cholecystectomy. Abdominal discomfort. EXAM: CT ABDOMEN AND PELVIS WITHOUT CONTRAST TECHNIQUE: Multidetector CT imaging of the abdomen and pelvis was performed following the standard protocol without IV contrast. COMPARISON:  07/09/2020 FINDINGS: Lower chest: Patchy atelectasis in both lower lobes. No consolidation or lobar collapse. Hepatobiliary: Large abscess in the gallbladder fossa, measuring over 10 cm in diameter. Air bubbles. Surrounding edematous change in the regional fat. Pancreas: Normal Spleen: Normal Adrenals/Urinary Tract: Adrenal glands are normal. Kidneys are normal. Bladder is normal. Stomach/Bowel: Stomach and small intestine are normal. No evidence of appendicitis. Evidence of colon obstruction. Diverticulosis without evidence of diverticulitis. Vascular/Lymphatic: Aortic atherosclerosis. No aneurysm. IVC is normal. No retroperitoneal adenopathy. Reproductive: Normal  Other: No free fluid or air.  No evidence of distant abscess. Musculoskeletal: Advanced lower lumbar degenerative changes. IMPRESSION: Large abscess in the gallbladder fossa, at least 10 cm in size. No evidence free intraperitoneal fluid or distant abscess/collection. Atelectasis in both lower lobes. Aortic Atherosclerosis (ICD10-I70.0). Electronically Signed   By: Nelson Chimes M.D.   On: 09/25/2020 16:31   CT HEAD WO CONTRAST  Result Date: 09/25/2020 CLINICAL DATA:  Slurred speech.  Recent cholecystectomy. EXAM: CT HEAD WITHOUT CONTRAST TECHNIQUE: Contiguous axial images were obtained from the base of the skull through the vertex without intravenous contrast. COMPARISON:  None. FINDINGS: Brain: Mild generalized volume loss. No evidence of old or acute focal infarction, mass lesion, hemorrhage, hydrocephalus or extra-axial collection. Vascular: There is atherosclerotic calcification of the major vessels at the base of the brain. Skull: Negative Sinuses/Orbits: Clear/normal Other: None IMPRESSION: No acute or significant finding.  Mild generalized volume loss. Electronically Signed   By: Nelson Chimes M.D.   On: 09/25/2020 16:33   US RENAL  Result Date: 09/26/2020 CLINICAL DATA:  Acute kidney failure EXAM: RENAL / URINARY TRACT ULTRASOUND COMPLETE COMPARISON:  CT abdomen pelvis 09/25/2020 FINDINGS: Right Kidney: Renal measurements: 13.8 x 6.3 x 5.9 cm = volume: 270 mL. Echogenicity within normal limits. No mass or hydronephrosis visualized. Left Kidney: Renal measurements: 12.9 x 7.1 x 5.3 cm = volume: 250 mL. Echogenicity within normal limits. No hydronephrosis. There is a small cyst in the superior pole measuring 2.6 cm. Bladder: Decompressed with Foley catheter in place. Other: None. IMPRESSION: No acute finding sonographically in the bilateral kidneys. Electronically Signed   By: Audie Pinto M.D.   On: 09/26/2020 14:13   DG Chest Portable 1 View  Result Date: 09/25/2020 CLINICAL DATA:  Sepsis and  altered mental status EXAM: PORTABLE CHEST 1 VIEW COMPARISON:  None. FINDINGS: Cardiac shadow is within normal limits. The lungs are hypoinflated with mild bibasilar atelectasis. No bony abnormality is seen. IMPRESSION: Mild bibasilar atelectasis in part due to a poor inspiratory effort. Electronically Signed   By: Inez Catalina M.D.   On: 09/25/2020 16:05   CT IMAGE GUIDED DRAINAGE BY PERCUTANEOUS CATHETER  Result Date: 09/26/2020 INDICATION: 65 year old male with a history of prior acute cholecystitis initially treated by percutaneous cholecystostomy tube placement followed by definitive cholecystectomy. Patient  now presents with sepsis and a large abscess in the gallbladder fossa. He requires urgent drainage for source control. EXAM: CT-guided drain placement MEDICATIONS: The patient is currently admitted to the hospital and receiving intravenous antibiotics. The antibiotics were administered within an appropriate time frame prior to the initiation of the procedure. ANESTHESIA/SEDATION: Fentanyl 50 mcg IV; Versed 1 mg IV Moderate Sedation Time:  15 minutes The patient was continuously monitored during the procedure by the interventional radiology nurse under my direct supervision. COMPLICATIONS: None immediate. PROCEDURE: Informed written consent was obtained from the patient after a thorough discussion of the procedural risks, benefits and alternatives. All questions were addressed. Maximal Sterile Barrier Technique was utilized including caps, mask, sterile gowns, sterile gloves, sterile drape, hand hygiene and skin antiseptic. A timeout was performed prior to the initiation of the procedure. A planning axial CT scan was performed. The large fluid and gas collection in the gallbladder fossa was successfully identified. A suitable skin entry site was selected and marked. The overlying skin was sterilely prepped and draped in the standard fashion using chlorhexidine skin prep. Local anesthesia was attained by  infiltration with 1% lidocaine. A small dermatotomy was made. A 22 gauge spinal needle was advanced and imaged obtaining confirmation of the angle for a safe path into the fluid collection. Next, using tandem trocar technique, a 12 [place] cook all-purpose drainage catheter was advanced into the fluid collection. The catheter was formed and aspiration performed yielding approximately 200 mL purulent and foul-smelling fluid. Samples were sent for Gram stain and culture. The catheter was then connected to JP bulb suction and secured to the skin with 0 Prolene suture. Follow-up CT imaging demonstrates a well-positioned drainage catheter. The patient was returned to the ICU in stable condition. IMPRESSION: Successful placement of 12 French drainage catheter into gallbladder fossa abscess with aspiration of approximately 200 mL foul-smelling and purulent fluid. Samples were sent for g stain and culture. Electronically Signed   By: Jacqulynn Cadet M.D.   On: 09/26/2020 09:31     Medications:   . sodium chloride Stopped (09/25/20 1647)  . sodium chloride    . sodium chloride    . dexmedetomidine (PRECEDEX) IV infusion 1.2 mcg/kg/hr (09/27/20 0810)  . norepinephrine (LEVOPHED) Adult infusion Stopped (09/27/20 0115)  . piperacillin-tazobactam (ZOSYN)  IV 12.5 mL/hr at 09/27/20 0810  .  sodium bicarbonate (isotonic) infusion in sterile water 75 mL/hr at 09/27/20 0810   . Chlorhexidine Gluconate Cloth  6 each Topical Daily  . hydrocortisone sod succinate (SOLU-CORTEF) inj  50 mg Intravenous Q6H  . insulin aspart  0-5 Units Subcutaneous QHS  . insulin aspart  0-9 Units Subcutaneous TID WC  . mouth rinse  15 mL Mouth Rinse BID  . midodrine  10 mg Oral TID WC  . pantoprazole (PROTONIX) IV  40 mg Intravenous Q24H  . risperiDONE  1 mg Oral Once  . sodium chloride flush  3 mL Intravenous Q12H  . sodium chloride flush  5 mL Intracatheter Q8H  . zolpidem  5 mg Oral QHS  . zolpidem  5 mg Oral Once   sodium  chloride, acetaminophen, docusate sodium, fentaNYL (SUBLIMAZE) injection, haloperidol lactate, midazolam, ondansetron (ZOFRAN) IV, polyethylene glycol, sodium chloride flush  Assessment/ Plan:  65 y.o. male with hypertension, diabetes mellitus type II, bladder cancer, admitted on 09/25/2020 for Septic shock ([place]) [A41.9, R65.21] Altered mental status, unspecified altered mental status type [R41.82] Postprocedural intraabdominal abscess [T81.43XA] Sepsis with acute renal failure and septic shock, due to unspecified  organism, unspecified acute renal failure type ([place]) [A41.9, R65.21, N17.9]   # AKI  baseline creatinine of 1.1, GFR >60 on 08/19/20.  Lab Results  Component Value Date   CREATININE 5.42 (H) 09/27/2020   CREATININE 4.76 (H) 09/26/2020   CREATININE 4.89 (H) 09/25/2020   05/13 0701 - 05/14 0700 In: 2171.9 [P.O.:120; I.V.:1902; IV Piggyback:149.9] Out: 1000 [Urine:965; Drains:35]  AKI likely secondary to ATN Patient is oliguric with worsening lab parameters including Phos, BUN and Creatinine Brother and sister his closest relatives who are at bedside. Discussed and explained current situation from renal standpoint. Offered that Patient would benefit from HD for metabolic optimization. They are in agreement to proceed with HD Discussed with Dr Mortimer Fries. Icu team to obtain temporary dialysis catheter  # Sepsis, Gall bladder Abscess, S/p robotic assisted Lap [place]ole 09/08/2020 S/p CT guided GB abscess drain placement by VIR, on 5/12 Blood culture positive for Bacteroides and enterococcus on 5/12 - requiring pressors   # hyperphosphatemia -in setting of renal failure - Expected to correct some with HD    LOS: [place] 5/14/20228:40 AM  [place], [place]  Note: This note was prepared with Dragon dictation. Any transcription errors are unintentional

## 2020-09-27 NOTE — Progress Notes (Signed)
Orders received by Dr. Mortimer Fries to increase precedex gtt to 1.4 if needed.

## 2020-09-27 NOTE — Procedures (Signed)
Endotracheal Intubation: Patient required placement of an artificial airway secondary to Respiratory Failure  Consent: Emergent.   Hand washing performed prior to starting the procedure.   Medications administered for sedation prior to procedure:  Midazolam 4 mg IV,  Vecuronium 20 mg IV, Fentanyl 200 mcg IV.    A time out procedure was called and correct patient, name, & ID confirmed. Needed supplies and equipment were assembled and checked to include ETT, 10 ml syringe, Glidescope, Mac and Miller blades, suction, oxygen and bag mask valve, end tidal CO2 monitor.   Patient was positioned to align the mouth and pharynx to facilitate visualization of the glottis.   Heart rate, SpO2 and blood pressure was continuously monitored during the procedure. Pre-oxygenation was conducted prior to intubation and endotracheal tube was placed through the vocal cords into the trachea.     The artificial airway was placed under direct visualization via glidescope route using a 8.0 ETT on the first attempt.  ETT was secured at 23 cm mark.  Placement was confirmed by auscuitation of lungs with good breath sounds bilaterally and no stomach sounds.  Condensation was noted on endotracheal tube.   Pulse ox 98%.  CO2 detector in place with appropriate color change.   Complications: None .   Operator: Cayleigh Paull.[place]   Chest radiograph ordered and pending.   Comments: OGT placed via glidescope.  Corrin Parker, M.D.  [facility]  Medical Director [place] Director [facility] Department

## 2020-09-27 NOTE — Progress Notes (Signed)
NAME:  Richard Oconnell, MRN:  [id number], DOB:  [birth year], LOS: 2 ADMISSION DATE:  09/25/2020  65 yo male with previous dx of acute cholecystitis s/p cholecystotomy tube 2/24 and then follow up robotic assisted cholecystectomy 4/25, now presents with large abdominal abscess with severe septic shock and BACTEREMIA, with multiorgan failure (liver and renal failure) complicated by severe metabolic acidosis and toxic metabolic encephalopathy.  History of Present Illness:  65 year old man past medical history of hypertension, diabetes, and bladder cancer who was hospitalized with acute cholecystitis-several months ago 2/24 He was temporized with a percutaneous cholecystostomy tube.   Follow up  cholangiogram via the tube demonstrates that the cystic duct is still obstructed He was offered him a robot-assisted laparoscopic cholecystectomy, but  deferred this procedure  On 4/25 recently had cholecystectomy along with significant lysis of adhesions on 4/25 here at [facility].  He presents today with somnolence and confusion.  Family reports that patient is disoriented the day before yesterday, after which his skin has begun to appear increasingly yellow.   +discomfort in the right upper quadrant of his abdomen, liver failure, renal failure, severe acidosis Patient started on pressors, admitted for septic shock  WBC 25 LA 6.5-->8.7 CREAT 4.8 TB 9.5   Pertinent  Medical History  2/24 admitted for acute cholecystitis,percutaneous cholecystostomy tube placed by IR  3/29 offered  a robot-assisted laparoscopic cholecystectomy, but deferred  4/25 s/p cholecystectomy along with significant lysis of adhesions on 4/25 here at [facility].   Significant Hospital Events: Including procedures, antibiotic start and stop dates in addition to other pertinent events   2/24 admitted for acute cholecystitis,percutaneous cholecystostomy tube placed by IR  3/29 offered  a robot-assisted laparoscopic  cholecystectomy, but deferred  4/25 s/p cholecystectomy along with significant lysis of adhesions on 4/25 here at [facility]. 5/12 admission to ICU for large abd abscess, jaundice and septic shock, on pressors 5/12 CT abd-Large abscess in the gallbladder fossa, at least 10 cm in size; Percutaneous drain placed by IR 5/13: Nephrology following, starting Bicarb gtt, remains on Levophed; Blood cultures growing Enterococcus faecalis,Bacteroides fragilis.  Pt extremely delirious and agitated, trying to leave to go home, interfering with medical treatment, plan to start Precedex. High risk for intubation due to severe Encephalopathy 5/14 severe sepsis, high risk for intubation, on precedex   Micro Data:  09/25/2020: SARS-CoV-2 PCR>> negative 09/25/2020: Influenza PCR>> negative 09/25/2020: Blood culture x2>>Enterococcus faecalis,Bacteroides fragilis 09/25/2020: MRSA PCR>> negative 09/25/2020: Aerobic/anaerobic culture from abdominal abscess>> moderate gram-negative coccobacilli (culture pending)  Antibiotics Given (last 72 hours)    Date/Time Action Medication Dose Rate   09/25/20 1506 New Bag/Given   piperacillin-tazobactam (ZOSYN) IVPB 3.375 g 3.375 g 100 mL/hr   09/26/20 0106 New Bag/Given   piperacillin-tazobactam (ZOSYN) IVPB 3.375 g 3.375 g 12.5 mL/hr   09/26/20 [id number] New Bag/Given   piperacillin-tazobactam (ZOSYN) IVPB 3.375 g 3.375 g 12.5 mL/hr   09/26/20 1500 New Bag/Given   piperacillin-tazobactam (ZOSYN) IVPB 3.375 g 3.375 g 12.5 mL/hr   09/26/20 2253 New Bag/Given   piperacillin-tazobactam (ZOSYN) IVPB 3.375 g 3.375 g 12.5 mL/hr   09/27/20 0527 New Bag/Given   piperacillin-tazobactam (ZOSYN) IVPB 3.375 g 3.375 g 12.5 mL/hr      Interim History / Subjective:  Remains on precedex Severe agitation and delirium Septic shock High risk for intubation and cardiac arrest Severe renal failure  CMP Latest Ref Rng & Units 09/27/2020 09/26/2020 09/25/2020  Glucose 70 - 99 mg/dL 241(H) 148(H) 84   BUN 8 - 23 mg/dL  74(H) 51(H) 46(H)  Creatinine 0.61 - 1.24 mg/dL 5.42(H) 4.76(H) 4.89(H)  Sodium 135 - 145 mmol/L 131(L) 135 135  Potassium 3.5 - 5.1 mmol/L 4.6 3.9 3.1(L)  Chloride 98 - 111 mmol/L 95(L) 98 96(L)  CO2 22 - 32 mmol/L 19(L) 17(L) 17(L)  Calcium 8.9 - 10.3 mg/dL 6.8(L) 7.9(L) 8.5(L)  Total Protein 6.5 - 8.1 g/dL 6.8 7.6 8.2(H)  Total Bilirubin 0.3 - 1.2 mg/dL 3.9(H) 6.7(H) 9.5(H)  Alkaline Phos 38 - 126 U/L 279(H) 360(H) 459(H)  AST 15 - 41 U/L 100(H) 133(H) 121(H)  ALT 0 - 44 U/L 48(H) 56(H) 56(H)         Objective   Blood pressure 99/75, pulse 61, temperature 98.4 F (36.9 C), temperature source Axillary, resp. rate (!) 21, height 6\' 4"  (1.93 m), weight 107 kg, SpO2 95 %.        Intake/Output Summary (Last 24 hours) at 09/27/2020 0719 Last data filed at 09/27/2020 0527 Gross per 24 hour  Intake 2171.93 ml  Output 1000 ml  Net 1171.93 ml   Filed Weights   09/25/20 1438 09/25/20 1851 09/26/20 0500  Weight: 113 kg 109.5 kg 107 kg      REVIEW OF SYSTEMS  PATIENT IS UNABLE TO PROVIDE COMPLETE REVIEW OF SYSTEMS DUE TO SEVERE CRITICAL ILLNESS AND TOXIC METABOLIC ENCEPHALOPATHY   PHYSICAL EXAMINATION:  GENERAL:critically ill appearing, HEAD: Normocephalic, atraumatic.  EYES: Pupils equal, round, reactive to light.  No scleral icterus.  MOUTH: Moist mucosal membrane. NECK: Supple. PULMONARY: +rhonchi, +wheezing CARDIOVASCULAR: S1 and S2. Regular rate and rhythm. No murmurs, rubs, or gallops.  GASTROINTESTINAL: +distended. NEG bowel sounds. Drain inplace MUSCULOSKELETAL: + edema.  NEUROLOGIC: obtunded SKIN:intact,warm,dry   Labs/imaging that I havepersonally reviewed  (right click and "Reselect all SmartList Selections" daily)      ASSESSMENT AND PLAN SYNOPSIS   65 yo male with previous dx of acute cholecystitis s/p cholecystotomy tube 2/24 and then follow up robotic assisted cholecystectomy 4/25 now presents with large abdominal abscess with  severe septic shock with multiorgan failure (liver and renal failure) complicated by severe metabolic acidosis and toxic metabolic encephalopathy    SEVERE SEPTIC SHOCK WITH  Enterococcus faecalis , Bacteroides fragilis and moderate gram-negative coccobacilli (culture pending) from [place]     Patient at high risk for intubation and cardiac arrest   CARDIAC ICU monitoring   ACUTE KIDNEY INJURY/Renal Failure -continue Foley Catheter-assess need -Avoid nephrotoxic agents -Follow urine output, BMP -Ensure adequate renal perfusion, optimize oxygenation -Renal dose medications Follow up nephology recs   NEUROLOGY Acute toxic metabolic encephalopathy, need for sedation precedex    SEPTIC SHOCK -use vasopressors to keep MAP>65 as needed -follow ABG and LA -consider stress dose steroids -aggressive IV fluid resuscitation    INFECTIOUS DISEASE -continue antibiotics as prescribed -follow up cultures -follow up ID consultation  ENDO - ICU hypoglycemic\Hyperglycemia protocol -check FSBS per protocol   GI GI PROPHYLAXIS as indicated  NUTRITIONAL STATUS DIET-->NPO Constipation protocol as indicated   ELECTROLYTES -follow labs as needed -replace as needed -pharmacy consultation and following    Best practice (right click and "Reselect all SmartList Selections" daily)  Diet:Oral as tolerated Pain/Anxiety/Delirium protocol (if indicated):Precedex VAP protocol (if indicated):NA DVT prophylaxis:SCD's GI prophylaxis:PPI Glucose control:SSI Central venous access:N/A Arterial line:N/A Foley:N/A Mobility:bed rest PT consulted:N/A Code Status:full code Disposition:ICU     Labs   CBC: Recent Labs  Lab 09/25/20 1444 09/26/20 0420 09/27/20 0416  WBC 25.5* 45.5* 29.2*  NEUTROABS 24.0*  --  26.0*  HGB 11.5* 10.3* 10.7*  HCT 34.1* 31.2* 30.7*  MCV 80.4 81.9 77.9*  PLT 535* 524* 465*    Basic Metabolic Panel: Recent  Labs  Lab 09/25/20 1444 09/25/20 1909 09/26/20 0420 09/27/20 0416  NA 135  --  135 131*  K 3.1*  --  3.9 4.6  CL 96*  --  98 95*  CO2 17*  --  17* 19*  GLUCOSE 84  --  148* 241*  BUN 46*  --  51* 74*  CREATININE 4.89*  --  4.76* 5.42*  CALCIUM 8.5*  --  7.9* 6.8*  MG  --  2.3 2.4 2.5*  PHOS  --  5.1* 7.5* 8.4*   GFR: Estimated Creatinine Clearance: 18.2 mL/min (A) (by C-G formula based on SCr of 5.42 mg/dL (H)). Recent Labs  Lab 09/25/20 1444 09/25/20 1502 09/25/20 1905 09/25/20 2151 09/26/20 0420 09/27/20 0416  PROCALCITON  --   --   --   --  148.71 99.94  WBC 25.5*  --   --   --  45.5* 29.2*  LATICACIDVEN  --  8.7*  6.5* 4.4* 3.1*  --   --     Liver Function Tests: Recent Labs  Lab 09/25/20 1444 09/26/20 0420 09/27/20 0416  AST 121* 133* 100*  ALT 56* 56* 48*  ALKPHOS 459* 360* 279*  BILITOT 9.5* 6.7* 3.9*  PROT 8.2* 7.6 6.8  ALBUMIN 2.6* 2.4* 2.0*   No results for input(s): LIPASE, AMYLASE in the last 168 hours. Recent Labs  Lab 09/25/20 1502  AMMONIA 19    ABG    Component Value Date/Time   HCO3 19.3 (L) 09/25/2020 2151   ACIDBASEDEF 5.7 (H) 09/25/2020 2151   O2SAT 73.2 09/25/2020 2151     Coagulation Profile: Recent Labs  Lab 09/25/20 1444  INR 1.5*    Cardiac Enzymes: No results for input(s): CKTOTAL, CKMB, CKMBINDEX, TROPONINI in the last 168 hours.  HbA1C: Hemoglobin A1C  Date/Time Value Ref Range Status  12/27/2019 12:00 AM 6.9  Final    Comment:    [facility]   Hgb A1c MFr Bld  Date/Time Value Ref Range Status  09/26/2020 04:20 AM 6.7 (H) 4.8 - 5.6 % Final    Comment:    (NOTE) Pre diabetes:          5.7%-6.4%  Diabetes:              >6.4%  Glycemic control for   <7.0% adults with diabetes   05/30/2020 10:47 AM 7.2 (H) <5.7 % of total Hgb Final    Comment:    For someone without known diabetes, a hemoglobin A1c value of 6.5% or greater indicates that they may have  diabetes and this should be confirmed with a  follow-up  test. . For someone with known diabetes, a value <7% indicates  that their diabetes is well controlled and a value  greater than or equal to 7% indicates suboptimal  control. A1c targets should be individualized based on  duration of diabetes, age, comorbid conditions, and  other considerations. . Currently, no consensus exists regarding use of hemoglobin A1c for diagnosis of diabetes for children. .     CBG: Recent Labs  Lab 09/25/20 1439 09/25/20 1834 09/25/20 2232 09/26/20 1552 09/26/20 2101  GLUCAP 88 96 134* 172* 184*    Allergies Allergies  Allergen Reactions  . Cyclobenzaprine Other (See Comments)    Not improving pain and makes him agressive.  . Lisinopril Cough  DVT/GI PRX  assessed I Assessed the need for Labs I Assessed the need for Foley I Assessed the need for Central Venous Line Family Discussion when available I Assessed the need for Mobilization I made an Assessment of medications to be adjusted accordingly Safety Risk assessment completed  CASE DISCUSSED IN MULTIDISCIPLINARY ROUNDS WITH ICU TEAM     Critical Care Time devoted to patient care services described in this note is 65  minutes.  Critical care was necessary to treat or prevent imminent or life-threatening deterioration. Overall, patient is critically ill, prognosis is guarded.  Patient with Multiorgan failure and at high risk for cardiac arrest and death.    Corrin Parker, M.D.  [facility]  Medical Director [place] Director [facility] Cardio-Pulmonary Department

## 2020-09-27 NOTE — Plan of Care (Signed)
Pt remains sedated on precedex drip for agitation. Pt responds to pain and touch. Blood pressure supported as needed with Norepi. Remains on 2L [place], all other VSS. Family updated x 2. No significant events during shift to note.  Problem: Health Behavior/Discharge Planning: Goal: Ability to manage health-related needs will improve Outcome: Progressing   Problem: Clinical Measurements: Goal: Ability to maintain clinical measurements within normal limits will improve Outcome: Progressing Goal: Will remain free from infection Outcome: Progressing Goal: Diagnostic test results will improve Outcome: Progressing Goal: Respiratory complications will improve Outcome: Progressing Goal: Cardiovascular complication will be avoided Outcome: Progressing   Problem: Education: Goal: Knowledge of General Education information will improve Description: Including pain rating scale, medication(s)/side effects and non-pharmacologic comfort measures Outcome: Progressing   Problem: Clinical Measurements: Goal: Diagnostic test results will improve Outcome: Progressing Goal: Signs and symptoms of infection will decrease Outcome: Progressing   Problem: Respiratory: Goal: Ability to maintain adequate ventilation will improve Outcome: Progressing

## 2020-09-27 NOTE — Progress Notes (Signed)
GOALS OF CARE DISCUSSION  The Clinical status was relayed to family in detail. Sister and Brother at bedside Updated and notified of patients medical condition.    Patient remains unresponsive and will not open eyes to command.   Patient is having a weak cough and struggling to remove secretions.   Patient with increased WOB and using accessory muscles to breathe Explained to family course of therapy and the modalities    Patient with Progressive multiorgan failure with a very high probablity of a very minimal chance of meaningful recovery despite all aggressive and optimal medical therapy.  PATIENT REMAINS FULL CODE Patient at high risk for intubation, will plan for HD catheter placement, Verbal consent obtained.  Family understands the situation.  Family are satisfied with Plan of action and management. All questions answered  Additional CC time 35 mins   [name] Patricia Pesa, M.D.  [facility]  Medical Director [name] Director [facility] Cardio-Pulmonary Department

## 2020-09-27 NOTE — Progress Notes (Signed)
PHARMACY NOTE:  ANTIMICROBIAL RENAL DOSAGE ADJUSTMENT  Current antimicrobial regimen includes a mismatch between antimicrobial dosage and estimated renal function.  As per policy approved by the Pharmacy & Therapeutics and Medical Executive Committees, the antimicrobial dosage will be adjusted accordingly.  Current antimicrobial dosage:  Zosyn 3.375 g IV q8h extended infusion  Indication: intra-abdominal infection  Renal Function:  Estimated Creatinine Clearance: 18.2 mL/min (A) (by C-G formula based on SCr of 5.42 mg/dL (H)).  Plan for HD once dialysis catheter placed.    Antimicrobial dosage has been changed to:  Zosyn 2.25 g IV q6h    Thank you for allowing pharmacy to be a part of this patient's care.  Dorena Bodo, PharmD 09/27/2020 10:19 AM

## 2020-09-27 NOTE — Progress Notes (Signed)
*  PRELIMINARY RESULTS* Echocardiogram 2D Echocardiogram has been performed.  Richard Oconnell Richard Oconnell 09/27/2020, 3:57 PM

## 2020-09-27 NOTE — Progress Notes (Signed)
Dr. Mortimer Fries made aware of unable to place OGT at this time. Orders received to attempt dobhoff.

## 2020-09-27 NOTE — Progress Notes (Signed)
Attempted to advance OGT and was unsuccessful.

## 2020-09-27 NOTE — Progress Notes (Signed)
Attempted NGT and dobhoff placement, Unsuccessful at both.

## 2020-09-27 NOTE — Progress Notes (Signed)
Pt intubated at this time due to agitation and probable difficulty maintaining airway during trialysis placement. Dr. Mortimer Fries has spoken with family. Pt tolerated well. Fentanyl and propofol gtt started for sedation. OGT attempted but would not advance at this time.

## 2020-09-27 NOTE — Procedures (Signed)
Central Venous Dailysis Catheter Placement: TRIPLE LUMEN    Indication(s) Hemo Dialysis/CRRT  Consent EMERGENT Risks of the procedure as well as the alternatives and risks of each were explained to the patient and/or caregiver.  Consent for the procedure was obtained and is signed in the bedside chart  Consent:emergent   Anesthesia Topical only with 1% lidocaine    Timeout Verified patient identification, verified procedure, site/side was marked, verified correct patient position, special equipment/implants available, medications/allergies/relevant history reviewed, required imaging and test results available. Patient comfort was obtained.     Sterile Technique Maximal sterile technique including full sterile barrier drape, hand hygiene, sterile gown, sterile gloves, mask, hair covering, sterile ultrasound probe cover (if used).   Hand washing performed prior to starting the procedure.    Procedure Description Area of catheter insertion was cleaned with chlorhexidine and draped in sterile fashion.  With real-time ultrasound guidance a central venous catheter was placed into the vein. Nonpulsatile blood flow and easy flushing noted in all ports.  The catheter was sutured in place and sterile dressing applied.   A triple lumen catheter was placed in LEFT Internal Jugular Vein There was good blood return, catheter caps were placed on lumens, catheter flushed easily, the line was secured and a sterile dressing and BIO-PATCH applied.   Ultrasound was used to visualize vasculature and guidance of needle.   Number of Attempts: 1 Complications:none  Estimated Blood Loss: none Operator: Nayomi Tabron.   Corrin Parker, M.D.  [facility]  Medical Director [place] Director [facility] Cardio-Pulmonary Department

## 2020-09-28 ENCOUNTER — Encounter: Payer: Self-pay | Admitting: Internal Medicine

## 2020-09-28 ENCOUNTER — Inpatient Hospital Stay: Payer: Medicare Other

## 2020-09-28 DIAGNOSIS — J9601 Acute respiratory failure with hypoxia: Secondary | ICD-10-CM | POA: Diagnosis not present

## 2020-09-28 DIAGNOSIS — Z4659 Encounter for fitting and adjustment of other gastrointestinal appliance and device: Secondary | ICD-10-CM

## 2020-09-28 DIAGNOSIS — L0291 Cutaneous abscess, unspecified: Secondary | ICD-10-CM | POA: Diagnosis not present

## 2020-09-28 DIAGNOSIS — G9340 Encephalopathy, unspecified: Secondary | ICD-10-CM | POA: Diagnosis not present

## 2020-09-28 DIAGNOSIS — A4181 Sepsis due to Enterococcus: Secondary | ICD-10-CM | POA: Diagnosis not present

## 2020-09-28 DIAGNOSIS — A4189 Other specified sepsis: Secondary | ICD-10-CM | POA: Diagnosis not present

## 2020-09-28 DIAGNOSIS — R6520 Severe sepsis without septic shock: Secondary | ICD-10-CM | POA: Diagnosis not present

## 2020-09-28 DIAGNOSIS — B9689 Other specified bacterial agents as the cause of diseases classified elsewhere: Secondary | ICD-10-CM | POA: Diagnosis not present

## 2020-09-28 DIAGNOSIS — R4182 Altered mental status, unspecified: Secondary | ICD-10-CM | POA: Diagnosis not present

## 2020-09-28 DIAGNOSIS — J9600 Acute respiratory failure, unspecified whether with hypoxia or hypercapnia: Secondary | ICD-10-CM

## 2020-09-28 LAB — CBC WITH DIFFERENTIAL/PLATELET
Abs Immature Granulocytes: 1.09 10*3/uL — ABNORMAL HIGH (ref 0.00–0.07)
Basophils Absolute: 0.1 10*3/uL (ref 0.0–0.1)
Basophils Relative: 0 %
Eosinophils Absolute: 0 10*3/uL (ref 0.0–0.5)
Eosinophils Relative: 0 %
HCT: 33.4 % — ABNORMAL LOW (ref 39.0–52.0)
Hemoglobin: 11.5 g/dL — ABNORMAL LOW (ref 13.0–17.0)
Immature Granulocytes: 3 %
Lymphocytes Relative: 5 %
Lymphs Abs: 1.8 10*3/uL (ref 0.7–4.0)
MCH: 27.3 pg (ref 26.0–34.0)
MCHC: 34.4 g/dL (ref 30.0–36.0)
MCV: 79.1 fL — ABNORMAL LOW (ref 80.0–100.0)
Monocytes Absolute: 1.4 10*3/uL — ABNORMAL HIGH (ref 0.1–1.0)
Monocytes Relative: 4 %
Neutro Abs: 32.5 10*3/uL — ABNORMAL HIGH (ref 1.7–7.7)
Neutrophils Relative %: 88 %
Platelets: 562 10*3/uL — ABNORMAL HIGH (ref 150–400)
RBC: 4.22 MIL/uL (ref 4.22–5.81)
RDW: 15.9 % — ABNORMAL HIGH (ref 11.5–15.5)
Smear Review: NORMAL
WBC: 36.9 10*3/uL — ABNORMAL HIGH (ref 4.0–10.5)
nRBC: 0.1 % (ref 0.0–0.2)

## 2020-09-28 LAB — ECHOCARDIOGRAM COMPLETE
AR max vel: 2.58 cm2
AV Peak grad: 2.3 mmHg
Ao pk vel: 0.76 m/s
Area-P 1/2: 2.91 cm2
Height: 76 in
S' Lateral: 4.09 cm
Weight: 3774.28 oz

## 2020-09-28 LAB — AEROBIC/ANAEROBIC CULTURE W GRAM STAIN (SURGICAL/DEEP WOUND): Special Requests: NORMAL

## 2020-09-28 LAB — CULTURE, BLOOD (ROUTINE X 2)

## 2020-09-28 LAB — COMPREHENSIVE METABOLIC PANEL
ALT: 53 U/L — ABNORMAL HIGH (ref 0–44)
AST: 91 U/L — ABNORMAL HIGH (ref 15–41)
Albumin: 1.9 g/dL — ABNORMAL LOW (ref 3.5–5.0)
Alkaline Phosphatase: 255 U/L — ABNORMAL HIGH (ref 38–126)
Anion gap: 16 — ABNORMAL HIGH (ref 5–15)
BUN: 74 mg/dL — ABNORMAL HIGH (ref 8–23)
CO2: 25 mmol/L (ref 22–32)
Calcium: 7.1 mg/dL — ABNORMAL LOW (ref 8.9–10.3)
Chloride: 93 mmol/L — ABNORMAL LOW (ref 98–111)
Creatinine, Ser: 5.42 mg/dL — ABNORMAL HIGH (ref 0.61–1.24)
GFR, Estimated: 11 mL/min — ABNORMAL LOW (ref >60)
Glucose, Bld: 230 mg/dL — ABNORMAL HIGH (ref 70–99)
Potassium: 4.3 mmol/L (ref 3.5–5.1)
Sodium: 134 mmol/L — ABNORMAL LOW (ref 135–145)
Total Bilirubin: 2.9 mg/dL — ABNORMAL HIGH (ref 0.3–1.2)
Total Protein: 6.3 g/dL — ABNORMAL LOW (ref 6.5–8.1)

## 2020-09-28 LAB — TRIGLYCERIDES: Triglycerides: 226 mg/dL — ABNORMAL HIGH (ref <150)

## 2020-09-28 LAB — GLUCOSE, CAPILLARY
Glucose-Capillary: 181 mg/dL — ABNORMAL HIGH (ref 70–99)
Glucose-Capillary: 228 mg/dL — ABNORMAL HIGH (ref 70–99)
Glucose-Capillary: 218 mg/dL — ABNORMAL HIGH (ref 70–99)
Glucose-Capillary: 202 mg/dL — ABNORMAL HIGH (ref 70–99)
Glucose-Capillary: 208 mg/dL — ABNORMAL HIGH (ref 70–99)
Glucose-Capillary: 207 mg/dL — ABNORMAL HIGH (ref 70–99)
Glucose-Capillary: 178 mg/dL — ABNORMAL HIGH (ref 70–99)

## 2020-09-28 LAB — URINE CULTURE: Culture: NO GROWTH

## 2020-09-28 LAB — MAGNESIUM: Magnesium: 2.4 mg/dL (ref 1.7–2.4)

## 2020-09-28 LAB — PROCALCITONIN: Procalcitonin: 56.3 ng/mL

## 2020-09-28 LAB — PHOSPHORUS: Phosphorus: 8.8 mg/dL — ABNORMAL HIGH (ref 2.5–4.6)

## 2020-09-28 MED ORDER — PIPERACILLIN-TAZOBACTAM IN DEX 2-0.25 GM/50ML IV SOLN
2.2500 g | Freq: Four times a day (QID) | INTRAVENOUS | Status: DC
  Administered 2020-09-28 – 2020-10-02 (×15): 2.25 g via INTRAVENOUS
  Filled 2020-09-28 (×16): qty 50

## 2020-09-28 MED ORDER — DOCUSATE SODIUM 50 MG/5ML PO LIQD: 100.0000 mg | Freq: Every day | ORAL | Status: DC | PRN

## 2020-09-28 MED ORDER — INSULIN GLARGINE 100 UNIT/ML ~~LOC~~ SOLN
10.0000 [IU] | Freq: Every day | SUBCUTANEOUS | Status: DC
  Administered 2020-09-28 – 2020-10-02 (×5): 10 [IU] via SUBCUTANEOUS
  Filled 2020-09-28 (×6): qty 0.1

## 2020-09-28 MED ORDER — INSULIN ASPART 100 UNIT/ML IJ SOLN
0.0000 [IU] | INTRAMUSCULAR | Status: DC
  Administered 2020-09-28 (×5): 7 [IU] via SUBCUTANEOUS
  Administered 2020-09-28 – 2020-09-29 (×5): 4 [IU] via SUBCUTANEOUS
  Administered 2020-09-29: 7 [IU] via SUBCUTANEOUS
  Administered 2020-09-29 – 2020-09-30 (×2): 4 [IU] via SUBCUTANEOUS
  Administered 2020-09-30: 3 [IU] via SUBCUTANEOUS
  Administered 2020-09-30 (×2): 4 [IU] via SUBCUTANEOUS
  Administered 2020-09-30: 7 [IU] via SUBCUTANEOUS
  Administered 2020-10-01: 11 [IU] via SUBCUTANEOUS
  Administered 2020-10-01: 4 [IU] via SUBCUTANEOUS
  Administered 2020-10-01 (×3): 7 [IU] via SUBCUTANEOUS
  Administered 2020-10-01: 4 [IU] via SUBCUTANEOUS
  Administered 2020-10-01: 11 [IU] via SUBCUTANEOUS
  Administered 2020-10-02: 4 [IU] via SUBCUTANEOUS
  Administered 2020-10-02: 7 [IU] via SUBCUTANEOUS
  Administered 2020-10-02 (×3): 4 [IU] via SUBCUTANEOUS
  Administered 2020-10-03: 7 [IU] via SUBCUTANEOUS
  Administered 2020-10-03 (×2): 4 [IU] via SUBCUTANEOUS
  Administered 2020-10-03: 11 [IU] via SUBCUTANEOUS
  Administered 2020-10-03 (×3): 7 [IU] via SUBCUTANEOUS
  Administered 2020-10-04: 3 [IU] via SUBCUTANEOUS
  Administered 2020-10-04 (×3): 4 [IU] via SUBCUTANEOUS
  Administered 2020-10-04: 3 [IU] via SUBCUTANEOUS
  Administered 2020-10-04: 7 [IU] via SUBCUTANEOUS
  Administered 2020-10-05 (×2): 4 [IU] via SUBCUTANEOUS
  Administered 2020-10-05 (×3): 7 [IU] via SUBCUTANEOUS
  Administered 2020-10-06 (×7): 4 [IU] via SUBCUTANEOUS
  Administered 2020-10-07: 3 [IU] via SUBCUTANEOUS
  Administered 2020-10-07: 7 [IU] via SUBCUTANEOUS
  Administered 2020-10-07 (×2): 4 [IU] via SUBCUTANEOUS
  Filled 2020-09-28 (×58): qty 1

## 2020-09-28 MED ORDER — ACETAMINOPHEN 325 MG PO TABS
650.0000 mg | ORAL_TABLET | ORAL | Status: DC | PRN
Start: 2020-09-28 — End: 2020-10-08
  Administered 2020-10-02 – 2020-10-06 (×2): 650 mg
  Filled 2020-09-28 (×2): qty 2

## 2020-09-28 MED ORDER — POLYETHYLENE GLYCOL 3350 17 G PO PACK: 17.0000 g | PACK | Freq: Every day | ORAL | Status: DC | PRN

## 2020-09-28 MED ORDER — MIDODRINE HCL 5 MG PO TABS
10.0000 mg | ORAL_TABLET | Freq: Three times a day (TID) | ORAL | Status: DC
  Administered 2020-09-28 – 2020-10-01 (×8): 10 mg
  Filled 2020-09-28 (×8): qty 2

## 2020-09-28 MED ORDER — ZOLPIDEM TARTRATE 5 MG PO TABS: 5.0000 mg | ORAL_TABLET | Freq: Every day | ORAL | Status: DC

## 2020-09-28 NOTE — Progress Notes (Signed)
Dr. Hampton Abbot at bedside and placed a 44F NG in right nare at 70cm, secured with silk tape. Positive bowel sounds on auscultation, will await xray verification of placement.

## 2020-09-28 NOTE — Progress Notes (Addendum)
NAME:  Richard Oconnell, MRN:  [id number], DOB:  1956/02/07, LOS: 3 ADMISSION DATE:  09/25/2020  65 yo male with previous dx of acute cholecystitis s/p cholecystotomy tube 2/24 and then follow up robotic assisted cholecystectomy 4/25,now presents with large abdominal abscess with severe septic shock and BACTEREMIA,with multiorgan failure (liver and renal failure) complicated by severe metabolic acidosis and toxic metabolic encephalopathy.  History of Present Illness:  65 year old man past medical history of hypertension, diabetes, and bladder cancer who was hospitalized with acute cholecystitis-several months ago 2/24 He was temporized with a percutaneous cholecystostomy tube.   Follow up cholangiogram via the tube demonstrates that the cystic duct is still obstructed He was offered him a robot-assisted laparoscopic cholecystectomy, but deferred this procedure  On 4/25 recently had cholecystectomy along with significant lysis of adhesions on 4/25 here at [facility].  He presents today with somnolence and confusion.  Family reports that patient is disoriented the day before yesterday, after which his skin has begun to appear increasingly yellow.   +discomfort in the right upper quadrant of his abdomen, liver failure, renal failure, severe acidosis Patient started on pressors, admitted for septic shock  WBC 25 LA 6.5-->8.7 CREAT 4.8 TB 9.5   Pertinent Medical History  2/24 admitted for acute cholecystitis,percutaneous cholecystostomy tube placed by IR  3/29 offered a robot-assisted laparoscopic cholecystectomy, but deferred  4/25 s/p cholecystectomy along with significant lysis of adhesions on 4/25 here at [facility].   Significant Hospital Events: Including procedures, antibiotic start and stop dates in addition to other pertinent events   2/24admitted for acute cholecystitis,percutaneous cholecystostomy tube placed by IR  3/29 offered a robot-assisted laparoscopic  cholecystectomy, but deferred  4/25 s/p cholecystectomy along with significant lysis of adhesions on 4/25 here at [facility]. 5/12 admission to ICU for large abd abscess, jaundice and septic shock, on pressors 5/12 CT abd-Large abscess in the gallbladder fossa, at least 10 cm in size; Percutaneous drain placed by IR 5/13:Nephrology following, starting Bicarb gtt, remains on Levophed; Blood cultures growingEnterococcus faecalis,Bacteroides fragilis.Pt extremely delirious and agitated, trying to leave to go home, interfering with medical treatment, plan to start Precedex. High risk for intubation due to severe Encephalopathy 5/14 severe sepsis, high risk for intubation, on precedex,  5/14 INTUBATED AND LEFT TRIALYSIS CATHETER PLACED   Micro Data:  09/25/2020: SARS-CoV-2 PCR>>negative 09/25/2020: Influenza PCR>>negative 09/25/2020: Blood culture x2>>Enterococcus faecalis,Bacteroides fragilis 09/25/2020: MRSA PCR>>negative 09/25/2020: Aerobic/anaerobic culture from abdominal abscess>>moderate gram-negative coccobacilli (culture pending)   Antibiotics Given (last 72 hours)    Date/Time Action Medication Dose Rate   09/25/20 1506 New Bag/Given   piperacillin-tazobactam (ZOSYN) IVPB 3.375 g 3.375 g 100 mL/hr   09/26/20 0106 New Bag/Given   piperacillin-tazobactam (ZOSYN) IVPB 3.375 g 3.375 g 12.5 mL/hr   09/26/20 5732 New Bag/Given   piperacillin-tazobactam (ZOSYN) IVPB 3.375 g 3.375 g 12.5 mL/hr   09/26/20 1500 New Bag/Given   piperacillin-tazobactam (ZOSYN) IVPB 3.375 g 3.375 g 12.5 mL/hr   09/26/20 2253 New Bag/Given   piperacillin-tazobactam (ZOSYN) IVPB 3.375 g 3.375 g 12.5 mL/hr   09/27/20 0527 New Bag/Given   piperacillin-tazobactam (ZOSYN) IVPB 3.375 g 3.375 g 12.5 mL/hr   09/27/20 1159 New Bag/Given   piperacillin-tazobactam (ZOSYN) 2.25 g in sodium chloride 0.9 % 50 mL IVPB 2.25 g 100 mL/hr   09/27/20 1711 New Bag/Given   piperacillin-tazobactam (ZOSYN) 2.25 g in sodium chloride  0.9 % 50 mL IVPB 2.25 g 100 mL/hr   09/28/20 0017 New Bag/Given   piperacillin-tazobactam (ZOSYN) 2.25 g in  sodium chloride 0.9 % 50 mL IVPB 2.25 g 100 mL/hr   09/28/20 0522 New Bag/Given   piperacillin-tazobactam (ZOSYN) 2.25 g in sodium chloride 0.9 % 50 mL IVPB 2.25 g 100 mL/hr         Interim History / Subjective:  Remains critically ill NOT improving Started HD, placed on VENT Multiorgan failure +septic shock +abd abscess and bacteremia     Objective   Blood pressure 94/63, pulse (!) 55, temperature (!) 97.5 F (36.4 C), temperature source Oral, resp. rate 16, height 6\' 4"  (1.93 m), weight 112.7 kg, SpO2 98 %.    Vent Mode: PRVC FiO2 (%):  [40 %-50 %] 40 % Set Rate:  [16 bmp] 16 bmp Vt Set:  [500 mL] 500 mL PEEP:  [5 cmH20] 5 cmH20 Plateau Pressure:  [13 cmH20-15 cmH20] 15 cmH20   Intake/Output Summary (Last 24 hours) at 09/28/2020 0715 Last data filed at 09/28/2020 0600 Gross per 24 hour  Intake 3069.91 ml  Output 599 ml  Net 2470.91 ml   Filed Weights   09/25/20 1851 09/26/20 0500 09/28/20 0340  Weight: 109.5 kg 107 kg 112.7 kg      REVIEW OF SYSTEMS  PATIENT IS UNABLE TO PROVIDE COMPLETE REVIEW OF SYSTEMS DUE TO SEVERE CRITICAL ILLNESS AND TOXIC METABOLIC ENCEPHALOPATHY   PHYSICAL EXAMINATION:  GENERAL:critically ill appearing, +resp distress HEAD: Normocephalic, atraumatic.  EYES: Pupils equal, round, reactive to light.  No scleral icterus.  MOUTH: Moist mucosal membrane. NECK: Supple. PULMONARY: +rhonchi, +wheezing CARDIOVASCULAR: S1 and S2. Regular rate and rhythm. No murmurs, rubs, or gallops.  GASTROINTESTINAL: Soft, nontender, -distended. Positive bowel sounds.  MUSCULOSKELETAL: No swelling, clubbing, or edema.  NEUROLOGIC: obtunded SKIN:intact,warm,dry   Labs/imaging that I havepersonally reviewed  (right click and "Reselect all SmartList Selections" daily)        ASSESSMENT AND PLAN SYNOPSIS   65 yo male with previous dx of  acute cholecystitis s/p cholecystotomy tube 2/24 and then follow up robotic assisted cholecystectomy 4/25 now presents with large abdominal abscess with severe septic shock with multiorgan failure (liver and renal failure) complicated by severe metabolic acidosis and toxic metabolic encephalopathyleading to severe hypoxic respiratory failure and progressive renal failure and metabolic acidosis  SEVERE SEPTIC SHOCK WITH  Enterococcus faecalis , Bacteroides fragilis and moderate gram-negative coccobacilli (culture pending) from LARGE ADB ABSCESS     Severe ACUTE Hypoxic and Hypercapnic Respiratory Failure -continue Mechanical Ventilator support -continue Bronchodilator Therapy -Wean Fio2 and PEEP as tolerated -VAP/VENT bundle implementation CHECK ECHO    CARDIAC ICU monitoring   ACUTE KIDNEY INJURY/Renal Failure -continue Foley Catheter-assess need -Avoid nephrotoxic agents -Follow urine output, BMP -Ensure adequate renal perfusion, optimize oxygenation -Renal dose medications VASC CATH PLACED HD as needed   NEUROLOGY Acute toxic metabolic encephalopathy, need for sedation Goal RASS -2 to -3   SEPTIC SHOCK -use vasopressors to keep MAP>65 as needed   INFECTIOUS DISEASE -continue antibiotics as prescribed -follow up cultures -follow up ID consultation  ENDO - ICU hypoglycemic\Hyperglycemia protocol -check FSBS per protocol   GI GI PROPHYLAXIS as indicated  NUTRITIONAL STATUS DIET-->TF's as tolerated Constipation protocol as indicated   ELECTROLYTES -follow labs as needed -replace as needed -pharmacy consultation and following   Best practice (right click and "Reselect all SmartList Selections" daily)  Diet:Oral as tolerated Pain/Anxiety/Delirium protocol (if indicated):Precedex VAP protocol (if indicated):NA DVT prophylaxis:SCD's GI prophylaxis:PPI Glucose control:SSI Central venous access:N/A Arterial  line:N/A Foley:N/A Mobility:bed rest PT consulted:N/A Code Status:full code Disposition:ICU    Labs  CBC: Recent Labs  Lab 09/25/20 1444 09/26/20 0420 09/27/20 0416 09/28/20 0418  WBC 25.5* 45.5* 29.2* 36.9*  NEUTROABS 24.0*  --  26.0* 32.5*  HGB 11.5* 10.3* 10.7* 11.5*  HCT 34.1* 31.2* 30.7* 33.4*  MCV 80.4 81.9 77.9* 79.1*  PLT 535* 524* 465* 562*    Basic Metabolic Panel: Recent Labs  Lab 09/25/20 1444 09/25/20 1909 09/26/20 0420 09/27/20 0416 09/28/20 0418  NA 135  --  135 131* 134*  K 3.1*  --  3.9 4.6 4.3  CL 96*  --  98 95* 93*  CO2 17*  --  17* 19* 25  GLUCOSE 84  --  148* 241* 230*  BUN 46*  --  51* 74* 74*  CREATININE 4.89*  --  4.76* 5.42* 5.42*  CALCIUM 8.5*  --  7.9* 6.8* 7.1*  MG  --  2.3 2.4 2.5* 2.4  PHOS  --  5.1* 7.5* 8.4* 8.8*   GFR: Estimated Creatinine Clearance: 18.7 mL/min (A) (by C-G formula based on SCr of 5.42 mg/dL (H)). Recent Labs  Lab 09/25/20 1444 09/25/20 1502 09/25/20 1905 09/25/20 2151 09/26/20 0420 09/27/20 0416 09/28/20 0418  PROCALCITON  --   --   --   --  148.71 99.94 56.30  WBC 25.5*  --   --   --  45.5* 29.2* 36.9*  LATICACIDVEN  --  8.7*  6.5* 4.4* 3.1*  --   --   --     Liver Function Tests: Recent Labs  Lab 09/25/20 1444 09/26/20 0420 09/27/20 0416 09/28/20 0418  AST 121* 133* 100* 91*  ALT 56* 56* 48* 53*  ALKPHOS 459* 360* 279* 255*  BILITOT 9.5* 6.7* 3.9* 2.9*  PROT 8.2* 7.6 6.8 6.3*  ALBUMIN 2.6* 2.4* 2.0* 1.9*   No results for input(s): LIPASE, AMYLASE in the last 168 hours. Recent Labs  Lab 09/25/20 1502  AMMONIA 19    ABG    Component Value Date/Time   PHART 7.35 09/27/2020 1155   PCO2ART 42 09/27/2020 1155   PO2ART 71 (L) 09/27/2020 1155   HCO3 23.2 09/27/2020 1155   ACIDBASEDEF 2.3 (H) 09/27/2020 1155   O2SAT 93.1 09/27/2020 1155     Coagulation Profile: Recent Labs  Lab 09/25/20 1444  INR 1.5*    Cardiac Enzymes: No results for input(s): CKTOTAL, CKMB,  CKMBINDEX, TROPONINI in the last 168 hours.  HbA1C: Hemoglobin A1C  Date/Time Value Ref Range Status  12/27/2019 12:00 AM 6.9  Final    Comment:    [facility]   Hgb A1c MFr Bld  Date/Time Value Ref Range Status  09/26/2020 04:20 AM 6.7 (H) 4.8 - 5.6 % Final    Comment:    (NOTE) Pre diabetes:          5.7%-6.4%  Diabetes:              >6.4%  Glycemic control for   <7.0% adults with diabetes   05/30/2020 10:47 AM 7.2 (H) <5.7 % of total Hgb Final    Comment:    For someone without known diabetes, a hemoglobin A1c value of 6.5% or greater indicates that they may have  diabetes and this should be confirmed with a follow-up  test. . For someone with known diabetes, a value <7% indicates  that their diabetes is well controlled and a value  greater than or equal to 7% indicates suboptimal  control. A1c targets should be individualized based on  duration of diabetes, age, comorbid conditions, and  other  considerations. . Currently, no consensus exists regarding use of hemoglobin A1c for diagnosis of diabetes for children. .     CBG: Recent Labs  Lab 09/27/20 1148 09/27/20 1631 09/27/20 1944 09/27/20 2346 09/28/20 0350  GLUCAP 238* 265* 223* 229* 228*    Allergies Allergies  Allergen Reactions  . Cyclobenzaprine Other (See Comments)    Not improving pain and makes him agressive.  . Lisinopril Cough       DVT/GI PRX  assessed I Assessed the need for Labs I Assessed the need for Foley I Assessed the need for Central Venous Line Family Discussion when available I Assessed the need for Mobilization I made an Assessment of medications to be adjusted accordingly Safety Risk assessment completed  CASE DISCUSSED IN MULTIDISCIPLINARY ROUNDS WITH ICU TEAM     Critical Care Time devoted to patient care services described in this note is 65  minutes.  Critical care was necessary to treat or prevent imminent or life-threatening deterioration. Overall,  patient is critically ill, prognosis is guarded.  Patient with Multiorgan failure and at high risk for cardiac arrest and death.    Corrin Parker, M.D.  [facility]  Medical Director [place] Director [facility] Cardio-Pulmonary Department

## 2020-09-28 NOTE — Progress Notes (Signed)
Secure chat received from Dr. Hampton Abbot: NGT to be pulled back 5cm to 65cm and then ok to use. Instructed to place on LIWS today, ok to give meds via tube, and consider starting tube feeds tomorrow if no plans for extubation.

## 2020-09-28 NOTE — Progress Notes (Signed)
[facility], [place] 09/28/20  Subjective:   Hospital day # 3  Patient is critically ill brother and sister are at bedside    Renal: 05/14 0701 - 05/15 0700 In: 3069.9 [I.V.:2819.9; IV Piggyback:250] Out: 599 [Urine:599] Lab Results  Component Value Date   CREATININE 5.42 (H) 09/28/2020   CREATININE 5.42 (H) 09/27/2020   CREATININE 4.76 (H) 09/26/2020   Patient is now intubated Tolerated HD yesterday  Objective:  Vital signs in last 24 hours:  Temp:  [97.5 F (36.4 C)-98 F (36.7 C)] 97.6 F (36.4 C) (05/15 0800) Pulse Rate:  [51-64] 57 (05/15 0800) Resp:  [0-24] 18 (05/15 0800) BP: (69-135)/(54-83) 90/62 (05/15 0800) SpO2:  [91 %-100 %] 98 % (05/15 0800) FiO2 (%):  [40 %-50 %] 40 % (05/15 0754) Weight:  [112.7 kg] 112.7 kg (05/15 0340)  Weight change:  Filed Weights   09/25/20 1851 09/26/20 0500 09/28/20 0340  Weight: 109.5 kg 107 kg 112.7 kg    Intake/Output:    Intake/Output Summary (Last 24 hours) at 09/28/2020 0929 Last data filed at 09/28/2020 0600 Gross per 24 hour  Intake 2651 ml  Output 499 ml  Net 2152 ml    Physical Exam: General:  No acute distress, laying in the bed  HEENT  dry oral mucous membrane  Pulm/lungs  tachypneic, lungs are clear to auscultation  CVS/Heart  regular rhythm, no rub or gallop  Abdomen:   Soft, nontender  Extremities:  No peripheral edema  Neurologic:  sedated  Skin:  No acute rashes   foley in place Left IJ tempo cath 09/27/20- ICU team  Basic Metabolic Panel:  Recent Labs  Lab 09/25/20 1444 09/25/20 1909 09/26/20 0420 09/27/20 0416 09/28/20 0418  NA 135  --  135 131* 134*  K 3.1*  --  3.9 4.6 4.3  CL 96*  --  98 95* 93*  CO2 17*  --  17* 19* 25  GLUCOSE 84  --  148* 241* 230*  BUN 46*  --  51* 74* 74*  CREATININE 4.89*  --  4.76* 5.42* 5.42*  CALCIUM 8.5*  --  7.9* 6.8* 7.1*  MG  --  2.3 2.4 2.5* 2.4  PHOS  --  5.1* 7.5* 8.4* 8.8*     CBC: Recent Labs  Lab  09/25/20 1444 09/26/20 0420 09/27/20 0416 09/28/20 0418  WBC 25.5* 45.5* 29.2* 36.9*  NEUTROABS 24.0*  --  26.0* 32.5*  HGB 11.5* 10.3* 10.7* 11.5*  HCT 34.1* 31.2* 30.7* 33.4*  MCV 80.4 81.9 77.9* 79.1*  PLT 535* 524* 465* 562*      Lab Results  Component Value Date   HEPBSAG NON REACTIVE 09/26/2020   HEPBSAB NON REACTIVE 09/26/2020   HEPBIGM NON REACTIVE 09/26/2020      Microbiology:  Recent Results (from the past 240 hour(s))  Culture, blood (routine x 2)     Status: Abnormal (Preliminary result)   Collection Time: 09/25/20  2:41 PM   Specimen: BLOOD  Result Value Ref Range Status   Specimen Description   Final    BLOOD RIGHT ANTECUBITAL Performed at [facility], [address] [place], [place] [address]    Special Requests   Final    BOTTLES DRAWN AEROBIC AND ANAEROBIC Blood Culture results may not be optimal due to an excessive volume of blood received in culture bottles Performed at [facility], [address] [place], [place] [address]    Culture  Setup Time   Final  Organism ID to follow GRAM POSITIVE COCCI IN BOTH AEROBIC AND ANAEROBIC BOTTLES CRITICAL RESULT CALLED TO, READ BACK BY AND VERIFIED WITH: LISA KLUTTZ _0  ON 09/26/20.SH Performed at [facility], [address], [place], [place] [address]    Culture (A)  Final    ENTEROCOCCUS FAECALIS SUSCEPTIBILITIES TO FOLLOW Performed at [facility], [place] [address], [place], [place] [address]    Report Status PENDING  Incomplete  Blood Culture ID Panel (Reflexed)     Status: Abnormal   Collection Time: 09/25/20  2:41 PM  Result Value Ref Range Status   Enterococcus faecalis DETECTED (A) NOT DETECTED Final    Comment: CRITICAL RESULT CALLED TO, READ BACK BY AND VERIFIED WITH: LISA KLUTTZ _1  ON 09/26/20.SH    Enterococcus Faecium NOT DETECTED NOT DETECTED Final   Listeria monocytogenes NOT DETECTED NOT DETECTED Final   Staphylococcus species NOT DETECTED  NOT DETECTED Final   Staphylococcus aureus (BCID) NOT DETECTED NOT DETECTED Final   Staphylococcus epidermidis NOT DETECTED NOT DETECTED Final   Staphylococcus lugdunensis NOT DETECTED NOT DETECTED Final   Streptococcus species NOT DETECTED NOT DETECTED Final   Streptococcus agalactiae NOT DETECTED NOT DETECTED Final   Streptococcus pneumoniae NOT DETECTED NOT DETECTED Final   Streptococcus pyogenes NOT DETECTED NOT DETECTED Final   A.calcoaceticus-baumannii NOT DETECTED NOT DETECTED Final   Bacteroides fragilis DETECTED (A) NOT DETECTED Final    Comment: CRITICAL RESULT CALLED TO, READ BACK BY AND VERIFIED WITH: LISA KLUTTZ _2  ON 09/26/20.[place]    Enterobacterales NOT DETECTED NOT DETECTED Final   Enterobacter cloacae complex NOT DETECTED NOT DETECTED Final   Escherichia coli NOT DETECTED NOT DETECTED Final   Klebsiella aerogenes NOT DETECTED NOT DETECTED Final   Klebsiella oxytoca NOT DETECTED NOT DETECTED Final   Klebsiella pneumoniae NOT DETECTED NOT DETECTED Final   Proteus species NOT DETECTED NOT DETECTED Final   Salmonella species NOT DETECTED NOT DETECTED Final   Serratia marcescens NOT DETECTED NOT DETECTED Final   Haemophilus influenzae NOT DETECTED NOT DETECTED Final   Neisseria meningitidis NOT DETECTED NOT DETECTED Final   Pseudomonas aeruginosa NOT DETECTED NOT DETECTED Final   Stenotrophomonas maltophilia NOT DETECTED NOT DETECTED Final   Candida albicans NOT DETECTED NOT DETECTED Final   Candida auris NOT DETECTED NOT DETECTED Final   Candida glabrata NOT DETECTED NOT DETECTED Final   Candida krusei NOT DETECTED NOT DETECTED Final   Candida parapsilosis NOT DETECTED NOT DETECTED Final   Candida tropicalis NOT DETECTED NOT DETECTED Final   Cryptococcus neoformans/gattii NOT DETECTED NOT DETECTED Final   Vancomycin resistance NOT DETECTED NOT DETECTED Final    Comment: Performed at [facility], [address] [place], [place] [address]  Culture, blood  (routine x 2)     Status: None (Preliminary result)   Collection Time: 09/25/20  3:42 PM   Specimen: BLOOD  Result Value Ref Range Status   Specimen Description BLOOD BLOOD LEFT HAND  Final   Special Requests   Final    BOTTLES DRAWN AEROBIC AND ANAEROBIC Blood Culture adequate volume   Culture   Final    NO GROWTH 3 DAYS Performed at [facility], [address] [place], [place] [address]    Report Status PENDING  Incomplete  Resp Panel by RT-PCR (Flu A&B, Covid) Nasopharyngeal Swab     Status: None   Collection Time: 09/25/20  3:51 PM   Specimen: Nasopharyngeal Swab; Nasopharyngeal(NP) swabs in vial transport medium  Result Value Ref Range  Status   SARS Coronavirus 2 by RT PCR NEGATIVE NEGATIVE Final    Comment: (NOTE) SARS-CoV-2 target nucleic acids are NOT DETECTED.  The SARS-CoV-2 RNA is generally detectable in upper respiratory specimens during the acute phase of infection. The lowest concentration of SARS-CoV-2 viral copies this assay can detect is 138 copies/mL. A negative result does not preclude SARS-Cov-2 infection and should not be used as the sole basis for treatment or other patient management decisions. A negative result may occur with  improper specimen collection/handling, submission of specimen other than nasopharyngeal swab, presence of viral mutation(s) within the areas targeted by this assay, and inadequate number of viral copies(<138 copies/mL). A negative result must be combined with clinical observations, patient history, and epidemiological information. The expected result is Negative.  Fact Sheet for Patients:  EntrepreneurPulse.com.au  Fact Sheet for Healthcare Providers:  IncredibleEmployment.be  This test is no t yet approved or cleared by the [place] FDA and  has been authorized for detection and/or diagnosis of SARS-CoV-2 by FDA under an Emergency Use Authorization (EUA). This EUA will remain   in effect (meaning this test can be used) for the duration of the COVID-19 declaration under Section 564(b)(1) of the Act, 21 U.S.C.section 360bbb-3(b)(1), unless the authorization is terminated  or revoked sooner.       Influenza A by PCR NEGATIVE NEGATIVE Final   Influenza B by PCR NEGATIVE NEGATIVE Final    Comment: (NOTE) The Xpert Xpress SARS-CoV-2/FLU/RSV plus assay is intended as an aid in the diagnosis of influenza from Nasopharyngeal swab specimens and should not be used as a sole basis for treatment. Nasal washings and aspirates are unacceptable for Xpert Xpress SARS-CoV-2/FLU/RSV testing.  Fact Sheet for Patients: EntrepreneurPulse.com.au  Fact Sheet for Healthcare Providers: IncredibleEmployment.be  This test is not yet approved or cleared by the [place] FDA and has been authorized for detection and/or diagnosis of SARS-CoV-2 by FDA under an Emergency Use Authorization (EUA). This EUA will remain in effect (meaning this test can be used) for the duration of the COVID-19 declaration under Section 564(b)(1) of the Act, 21 U.S.C. section 360bbb-3(b)(1), unless the authorization is terminated or revoked.  Performed at [facility], [address] [place], [place] [address]   MRSA PCR Screening     Status: None   Collection Time: 09/25/20  6:44 PM   Specimen: Nasopharyngeal  Result Value Ref Range Status   MRSA by PCR NEGATIVE NEGATIVE Final    Comment:        The GeneXpert MRSA Assay (FDA approved for NASAL specimens only), is one component of a comprehensive MRSA colonization surveillance program. It is not intended to diagnose MRSA infection nor to guide or monitor treatment for MRSA infections. Performed at [facility], [address] [place], [place] [address]   Aerobic/Anaerobic Culture (surgical/deep wound)     Status: None (Preliminary result)   Collection Time: 09/25/20  9:02 PM    Specimen: Abscess  Result Value Ref Range Status   Specimen Description   Final    ABSCESS Performed at [facility], [address], [place], [place] [address]    Special Requests   Final    Normal Performed at [facility], [address] [place], [place] [address]    Gram Stain   Final    ABUNDANT WBC PRESENT,BOTH PMN AND MONONUCLEAR MODERATE GRAM NEGATIVE COCCOBACILLI    Culture   Final    ABUNDANT ENTEROCOCCUS FAECALIS SUSCEPTIBILITIES TO FOLLOW REPEATING CULTURE  REINCUBATED FOR BETTER GROWTH Performed at [facility], [place] [address] [place], [place] [address]    Report Status PENDING  Incomplete    Coagulation Studies: Recent Labs    09/25/20 1444  LABPROT 17.7*  INR 1.5*    Urinalysis: Recent Labs    09/26/20 1336  COLORURINE RED*  LABSPEC 1.020  PHURINE TEST NOT REPORTED DUE TO COLOR INTERFERENCE OF URINE PIGMENT  GLUCOSEU TEST NOT REPORTED DUE TO COLOR INTERFERENCE OF URINE PIGMENT*  HGBUR TEST NOT REPORTED DUE TO COLOR INTERFERENCE OF URINE PIGMENT*  BILIRUBINUR TEST NOT REPORTED DUE TO COLOR INTERFERENCE OF URINE PIGMENT*  KETONESUR TEST NOT REPORTED DUE TO COLOR INTERFERENCE OF URINE PIGMENT*  PROTEINUR TEST NOT REPORTED DUE TO COLOR INTERFERENCE OF URINE PIGMENT*  NITRITE TEST NOT REPORTED DUE TO COLOR INTERFERENCE OF URINE PIGMENT*  LEUKOCYTESUR TEST NOT REPORTED DUE TO COLOR INTERFERENCE OF URINE PIGMENT*      Imaging: US RENAL  Result Date: 09/26/2020 CLINICAL DATA:  Acute kidney failure EXAM: RENAL / URINARY TRACT ULTRASOUND COMPLETE COMPARISON:  CT abdomen pelvis 09/25/2020 FINDINGS: Right Kidney: Renal measurements: 13.8 x 6.3 x 5.9 cm = volume: 270 mL. Echogenicity within normal limits. No mass or hydronephrosis visualized. Left Kidney: Renal measurements: 12.9 x 7.1 x 5.3 cm = volume: 250 mL. Echogenicity within normal limits. No hydronephrosis. There is a small cyst in the superior pole measuring 2.6 cm. Bladder:  Decompressed with Foley catheter in place. Other: None. IMPRESSION: No acute finding sonographically in the bilateral kidneys. Electronically Signed   By: Audie Pinto M.D.   On: 09/26/2020 14:13   DG Chest Port 1 View  Result Date: 09/28/2020 CLINICAL DATA:  Evaluate ET tube placement EXAM: PORTABLE CHEST 1 VIEW COMPARISON:  09/27/2020 FINDINGS: Endotracheal tube tip is above the carina. There is a left IJ central venous catheter with tip in the projection of the SVC. Decreased lung volumes and small bilateral pleural effusions. Atelectasis noted in the left lung base. IMPRESSION: 1. Decreased lung volumes with small bilateral pleural effusions. 2. Left base atelectasis. Electronically Signed   By: Kerby Moors M.D.   On: 09/28/2020 08:07   DG Chest Port 1 View  Result Date: 09/27/2020 CLINICAL DATA:  Central line placement EXAM: PORTABLE CHEST 1 VIEW COMPARISON:  09/25/2020 FINDINGS: Interval placement of left neck large bore multi lumen vascular catheter, tips projecting over the mid SVC. Interval placement of endotracheal tube, tip projecting over the mid trachea. Interval placement of esophagogastric tube, tip and side port well above the diaphragm, at least 5 cm. Mild, diffuse interstitial pulmonary opacity, unchanged compared to prior. IMPRESSION: 1. Interval placement of left neck large bore multi lumen vascular catheter, tips projecting over the mid SVC. 2. Interval placement of endotracheal tube, tip projecting over the mid trachea. 3. Interval placement of esophagogastric tube, tip and side port well above the diaphragm, at least 5 cm. Recommend advancement to ensure subdiaphragmatic positioning. Electronically Signed   By: Eddie Candle M.D.   On: 09/27/2020 12:00     Medications:   . sodium chloride Stopped (09/25/20 1647)  . sodium chloride    . sodium chloride    . fentaNYL infusion INTRAVENOUS 200 mcg/hr (09/28/20 0600)  . midazolam    . norepinephrine (LEVOPHED) Adult  infusion 7 mcg/min (09/28/20 0921)  . piperacillin-tazobactam (ZOSYN)  IV Stopped (09/28/20 0552)  . propofol (DIPRIVAN) infusion 40 mcg/kg/min (09/28/20 0603)  .  sodium bicarbonate (isotonic) infusion in sterile water 75 mL/hr at 09/28/20  0600   . chlorhexidine gluconate (MEDLINE KIT)  15 mL Mouth Rinse BID  . Chlorhexidine Gluconate Cloth  6 each Topical Daily  . docusate  100 mg Per Tube BID  . hydrocortisone sod succinate (SOLU-CORTEF) inj  50 mg Intravenous Q6H  . insulin aspart  0-20 Units Subcutaneous Q4H  . mouth rinse  15 mL Mouth Rinse 10 times per day  . midodrine  10 mg Oral TID WC  . pantoprazole (PROTONIX) IV  40 mg Intravenous Q24H  . polyethylene glycol  17 g Per Tube Daily  . risperiDONE  1 mg Oral Once  . sodium chloride flush  3 mL Intravenous Q12H  . sodium chloride flush  5 mL Intracatheter Q8H  . zolpidem  5 mg Oral QHS  . zolpidem  5 mg Oral Once   sodium chloride, acetaminophen, docusate sodium, fentaNYL (SUBLIMAZE) injection, haloperidol lactate, midazolam, ondansetron (ZOFRAN) IV, polyethylene glycol, sodium chloride flush  Assessment/ Plan:  65 y.o. male with hypertension, diabetes mellitus type II, bladder cancer, admitted on 09/25/2020 for Septic shock ([place]) [A41.9, R65.21] Altered mental status, unspecified altered mental status type [R41.82] Postprocedural intraabdominal abscess [T81.43XA] Sepsis with acute renal failure and septic shock, due to unspecified organism, unspecified acute renal failure type ([place]) [A41.9, R65.21, N17.9]   # AKI  baseline creatinine of 1.1, GFR >60 on 08/19/20.  Lab Results  Component Value Date   CREATININE 5.42 (H) 09/28/2020   CREATININE 5.42 (H) 09/27/2020   CREATININE 4.76 (H) 09/26/2020   05/14 0701 - 05/15 0700 In: 3069.9 [I.V.:2819.9; IV Piggyback:250] Out: 832 [Urine:599]  AKI likely secondary to ATN  fair UOP Electrolytes and Volume status are acceptable No acute indication for Dialysis at present  Assess  daily  # Sepsis, Gall bladder Abscess, S/p robotic assisted Lap [place] 09/08/2020 S/p CT guided GB abscess drain placement by VIR, on 5/12 Blood culture positive for Bacteroides and enterococcus on 5/12 - requiring pressors   # hyperphosphatemia -in setting of renal failure  - Expected to correct some with HD  # Acute resp failure Vent dependent now intubated 09/27/20    LOS: [place] 5/15/20229:29 [place], [place]  Note: This note was prepared with Dragon dictation. Any transcription errors are unintentional

## 2020-09-28 NOTE — Progress Notes (Signed)
09/28/2020  Subjective: Patient was intubated yesterday due to respiratory distress and decreased oxygen saturations.  He also had a temporary dialysis catheter placed yesterday via left IJ and had a round of dialysis yesterday which he tolerated well.  He is currently on propofol and fentanyl for sedation and pain and has required a stable dose of norepinephrine due to sedation likely as he has been weaned off prior to intubation.  Has not had any fevers overnight but his white blood cell count did go up again this morning to 36.9 from 29.2.  His LFTs continue to improve and his renal function is stable.  Vital signs: Temp:  [97.5 F (36.4 C)-98 F (36.7 C)] 97.6 F (36.4 C) (05/15 0800) Pulse Rate:  [51-64] 57 (05/15 0800) Resp:  [0-18] 18 (05/15 0800) BP: (78-135)/(58-83) 90/62 (05/15 0800) SpO2:  [93 %-100 %] 100 % (05/15 1118) FiO2 (%):  [35 %-50 %] 35 % (05/15 1118) Weight:  [112.7 kg] 112.7 kg (05/15 0340)   Intake/Output: 05/14 0701 - 05/15 0700 In: 3069.9 [I.V.:2819.9; IV Piggyback:250] Out: 599 [Urine:599] Last BM Date: 09/26/20  Physical Exam: Constitutional: No acute distress, but sedated currently Cardiac: Regular rhythm and rate Pulm: Intubated, full vent support Abdomen: Soft, nondistended, with right-sided percutaneous drain in place with seropurulent fluid appears to be a little more clear compared to yesterday  Labs:  Recent Labs    09/27/20 0416 09/28/20 0418  WBC 29.2* 36.9*  HGB 10.7* 11.5*  HCT 30.7* 33.4*  PLT 465* 562*   Recent Labs    09/27/20 0416 09/28/20 0418  NA 131* 134*  K 4.6 4.3  CL 95* 93*  CO2 19* 25  GLUCOSE 241* 230*  BUN 74* 74*  CREATININE 5.42* 5.42*  CALCIUM 6.8* 7.1*   Recent Labs    09/25/20 1444  LABPROT 17.7*  INR 1.5*    Imaging: DG Chest Port 1 View  Result Date: 09/28/2020 CLINICAL DATA:  Evaluate ET tube placement EXAM: PORTABLE CHEST 1 VIEW COMPARISON:  09/27/2020 FINDINGS: Endotracheal tube tip is above  the carina. There is a left IJ central venous catheter with tip in the projection of the SVC. Decreased lung volumes and small bilateral pleural effusions. Atelectasis noted in the left lung base. IMPRESSION: 1. Decreased lung volumes with small bilateral pleural effusions. 2. Left base atelectasis. Electronically Signed   By: Kerby Moors M.D.   On: 09/28/2020 08:07    Assessment/Plan: This is a 65 y.o. male with severe sepsis with bacteremia, AKI, encephalopathy and large right upper quadrant fluid collection status post robotic cholecystectomy.  - Was able to place an NG tube at bedside.  KUB shows NG tube within the stomach also appears to be a little bit distal.  Have asked his nurse to pull it back about 5 cm.  It can otherwise be used.  Would recommend today keeping it to low intermittent suction, and tomorrow may consider nutrition via tube feeds if he does not appear to be close to extubation. - Wean vent and sedation as tolerated. - Continue broad-spectrum antibiotics. - Continue monitoring the patient's right upper quadrant drain.  Currently appears to be clearing up a little bit.  Culture showing Enterococcus faecalis. -Discussed with the patient's nurse and family.  Face-to-face time spent with the patient and care providers was 35 minutes, with more than 50% of the time spent counseling, educating, and coordinating care of the patient.      Melvyn Neth, [facility]

## 2020-09-29 ENCOUNTER — Ambulatory Visit: Payer: Medicare Other | Admitting: Family Medicine

## 2020-09-29 DIAGNOSIS — Z7189 Other specified counseling: Secondary | ICD-10-CM

## 2020-09-29 DIAGNOSIS — N179 Acute kidney failure, unspecified: Secondary | ICD-10-CM | POA: Diagnosis not present

## 2020-09-29 DIAGNOSIS — A4189 Other specified sepsis: Secondary | ICD-10-CM | POA: Diagnosis not present

## 2020-09-29 DIAGNOSIS — A4181 Sepsis due to Enterococcus: Secondary | ICD-10-CM | POA: Diagnosis not present

## 2020-09-29 DIAGNOSIS — J9601 Acute respiratory failure with hypoxia: Secondary | ICD-10-CM

## 2020-09-29 DIAGNOSIS — R6520 Severe sepsis without septic shock: Secondary | ICD-10-CM | POA: Diagnosis not present

## 2020-09-29 DIAGNOSIS — B9689 Other specified bacterial agents as the cause of diseases classified elsewhere: Secondary | ICD-10-CM | POA: Diagnosis not present

## 2020-09-29 DIAGNOSIS — Z515 Encounter for palliative care: Secondary | ICD-10-CM

## 2020-09-29 DIAGNOSIS — R6521 Severe sepsis with septic shock: Secondary | ICD-10-CM | POA: Diagnosis not present

## 2020-09-29 DIAGNOSIS — L0291 Cutaneous abscess, unspecified: Secondary | ICD-10-CM | POA: Diagnosis not present

## 2020-09-29 DIAGNOSIS — R4182 Altered mental status, unspecified: Secondary | ICD-10-CM | POA: Diagnosis not present

## 2020-09-29 DIAGNOSIS — A419 Sepsis, unspecified organism: Secondary | ICD-10-CM | POA: Diagnosis not present

## 2020-09-29 LAB — GLUCOSE, CAPILLARY
Glucose-Capillary: 218 mg/dL — ABNORMAL HIGH (ref 70–99)
Glucose-Capillary: 197 mg/dL — ABNORMAL HIGH (ref 70–99)
Glucose-Capillary: 170 mg/dL — ABNORMAL HIGH (ref 70–99)
Glucose-Capillary: 177 mg/dL — ABNORMAL HIGH (ref 70–99)
Glucose-Capillary: 168 mg/dL — ABNORMAL HIGH (ref 70–99)
Glucose-Capillary: 175 mg/dL — ABNORMAL HIGH (ref 70–99)

## 2020-09-29 LAB — CBC WITH DIFFERENTIAL/PLATELET
Abs Immature Granulocytes: 1.02 10*3/uL — ABNORMAL HIGH (ref 0.00–0.07)
Basophils Absolute: 0.1 10*3/uL (ref 0.0–0.1)
Basophils Relative: 0 %
Eosinophils Absolute: 0 10*3/uL (ref 0.0–0.5)
Eosinophils Relative: 0 %
HCT: 30.1 % — ABNORMAL LOW (ref 39.0–52.0)
Hemoglobin: 10.5 g/dL — ABNORMAL LOW (ref 13.0–17.0)
Immature Granulocytes: 4 %
Lymphocytes Relative: 6 %
Lymphs Abs: 1.6 10*3/uL (ref 0.7–4.0)
MCH: 27.2 pg (ref 26.0–34.0)
MCHC: 34.9 g/dL (ref 30.0–36.0)
MCV: 78 fL — ABNORMAL LOW (ref 80.0–100.0)
Monocytes Absolute: 1.2 10*3/uL — ABNORMAL HIGH (ref 0.1–1.0)
Monocytes Relative: 4 %
Neutro Abs: 23.2 10*3/uL — ABNORMAL HIGH (ref 1.7–7.7)
Neutrophils Relative %: 86 %
Platelets: 475 10*3/uL — ABNORMAL HIGH (ref 150–400)
RBC: 3.86 MIL/uL — ABNORMAL LOW (ref 4.22–5.81)
RDW: 15.8 % — ABNORMAL HIGH (ref 11.5–15.5)
Smear Review: NORMAL
WBC: 27 10*3/uL — ABNORMAL HIGH (ref 4.0–10.5)
nRBC: 0.2 % (ref 0.0–0.2)

## 2020-09-29 LAB — COMPREHENSIVE METABOLIC PANEL
ALT: 42 U/L (ref 0–44)
AST: 55 U/L — ABNORMAL HIGH (ref 15–41)
Albumin: 1.8 g/dL — ABNORMAL LOW (ref 3.5–5.0)
Alkaline Phosphatase: 213 U/L — ABNORMAL HIGH (ref 38–126)
Anion gap: 16 — ABNORMAL HIGH (ref 5–15)
BUN: 86 mg/dL — ABNORMAL HIGH (ref 8–23)
CO2: 28 mmol/L (ref 22–32)
Calcium: 6.7 mg/dL — ABNORMAL LOW (ref 8.9–10.3)
Chloride: 87 mmol/L — ABNORMAL LOW (ref 98–111)
Creatinine, Ser: 6.13 mg/dL — ABNORMAL HIGH (ref 0.61–1.24)
GFR, Estimated: 9 mL/min — ABNORMAL LOW (ref >60)
Glucose, Bld: 228 mg/dL — ABNORMAL HIGH (ref 70–99)
Potassium: 3.8 mmol/L (ref 3.5–5.1)
Sodium: 131 mmol/L — ABNORMAL LOW (ref 135–145)
Total Bilirubin: 2.2 mg/dL — ABNORMAL HIGH (ref 0.3–1.2)
Total Protein: 5.8 g/dL — ABNORMAL LOW (ref 6.5–8.1)

## 2020-09-29 LAB — KAPPA/LAMBDA LIGHT CHAINS
Kappa free light chain: 200.8 mg/L — ABNORMAL HIGH (ref 3.3–19.4)
Kappa, lambda light chain ratio: 2.08 — ABNORMAL HIGH (ref 0.26–1.65)
Lambda free light chains: 96.7 mg/L — ABNORMAL HIGH (ref 5.7–26.3)

## 2020-09-29 LAB — PROTEIN ELECTROPHORESIS, SERUM
A/G Ratio: 0.5 — ABNORMAL LOW (ref 0.7–1.7)
Albumin ELP: 2.3 g/dL — ABNORMAL LOW (ref 2.9–4.4)
Alpha-1-Globulin: 0.5 g/dL — ABNORMAL HIGH (ref 0.0–0.4)
Alpha-2-Globulin: 1 g/dL (ref 0.4–1.0)
Beta Globulin: 1.2 g/dL (ref 0.7–1.3)
Gamma Globulin: 1.7 g/dL (ref 0.4–1.8)
Globulin, Total: 4.3 g/dL — ABNORMAL HIGH (ref 2.2–3.9)
Total Protein ELP: 6.6 g/dL (ref 6.0–8.5)

## 2020-09-29 LAB — PHOSPHORUS: Phosphorus: 8.6 mg/dL — ABNORMAL HIGH (ref 2.5–4.6)

## 2020-09-29 LAB — MAGNESIUM: Magnesium: 2.5 mg/dL — ABNORMAL HIGH (ref 1.7–2.4)

## 2020-09-29 MED ORDER — FLUCONAZOLE IN SODIUM CHLORIDE 400-0.9 MG/200ML-% IV SOLN
400.0000 mg | Freq: Once | INTRAVENOUS | Status: AC
  Administered 2020-09-29: 400 mg via INTRAVENOUS
  Filled 2020-09-29: qty 200

## 2020-09-29 MED ORDER — RENA-VITE PO TABS
1.0000 | ORAL_TABLET | Freq: Every day | ORAL | Status: DC
  Administered 2020-09-29 – 2020-10-06 (×7): 1
  Filled 2020-09-29 (×8): qty 1

## 2020-09-29 MED ORDER — VITAL HIGH PROTEIN PO LIQD
1000.0000 mL | ORAL | Status: DC
  Administered 2020-09-29: 1000 mL

## 2020-09-29 MED ORDER — ALBUTEROL SULFATE (2.5 MG/3ML) 0.083% IN NEBU: 2.5000 mg | INHALATION_SOLUTION | RESPIRATORY_TRACT | Status: DC | PRN

## 2020-09-29 MED ORDER — PROSOURCE TF PO LIQD
90.0000 mL | Freq: Four times a day (QID) | ORAL | Status: DC
  Administered 2020-09-29 – 2020-09-30 (×3): 90 mL
  Filled 2020-09-29 (×6): qty 90

## 2020-09-29 MED ORDER — FREE WATER
30.0000 mL | Status: DC
  Administered 2020-09-29 – 2020-10-05 (×34): 30 mL

## 2020-09-29 MED ORDER — HYDROCORTISONE NA SUCCINATE PF 100 MG IJ SOLR
50.0000 mg | Freq: Two times a day (BID) | INTRAMUSCULAR | Status: DC
  Administered 2020-09-29 – 2020-10-01 (×4): 50 mg via INTRAVENOUS
  Filled 2020-09-29 (×4): qty 2

## 2020-09-29 MED ORDER — FLUCONAZOLE IN SODIUM CHLORIDE 400-0.9 MG/200ML-% IV SOLN
400.0000 mg | INTRAVENOUS | Status: DC
  Administered 2020-09-30 – 2020-10-05 (×6): 400 mg via INTRAVENOUS
  Filled 2020-09-29 (×7): qty 200

## 2020-09-29 MED ORDER — FLUCONAZOLE IN SODIUM CHLORIDE 400-0.9 MG/200ML-% IV SOLN
800.0000 mg | Freq: Once | INTRAVENOUS | Status: DC
  Filled 2020-09-29: qty 400

## 2020-09-29 MED ORDER — HEPARIN SODIUM (PORCINE) 5000 UNIT/ML IJ SOLN
5000.0000 [IU] | Freq: Three times a day (TID) | INTRAMUSCULAR | Status: DC
  Administered 2020-09-29 – 2020-10-11 (×36): 5000 [IU] via SUBCUTANEOUS
  Filled 2020-09-29 (×35): qty 1

## 2020-09-29 MED ORDER — ADULT MULTIVITAMIN LIQUID CH
15.0000 mL | Freq: Every day | ORAL | Status: DC
  Administered 2020-09-30: 15 mL
  Filled 2020-09-29: qty 15

## 2020-09-29 MED ORDER — ALBUMIN HUMAN 25 % IV SOLN
25.0000 g | Freq: Once | INTRAVENOUS | Status: AC
  Administered 2020-09-29: 25 g via INTRAVENOUS
  Filled 2020-09-29: qty 100

## 2020-09-29 NOTE — Consult Note (Signed)
[facility] INFECTIOUS DISEASE PROGRESS NOTE Date of Admission:  09/25/2020     ID: Richard Oconnell is a 65 y.o. male with sepsis, intrabd abscess Active Problems:   Sepsis with acute renal failure and septic shock (HCC)   Altered mental status   Abscess   Acute kidney failure, unspecified ([place])   Encounter for nasogastric (NG) tube placement   Acute respiratory failure ([place])   Subjective: Remains intubated and on low-dose pressors.  Drain with decreasing output from his abdominal abscess. No fevers and white count is coming down slowly to 27.  He remains on Zosyn.  Started on hemodialysis.  ROS  Unable to obtain  Medications:  Antibiotics Given (last 72 hours)    Date/Time Action Medication Dose Rate   09/26/20 1500 New Bag/Given   piperacillin-tazobactam (ZOSYN) IVPB 3.375 g 3.375 g 12.5 mL/hr   09/26/20 2253 New Bag/Given   piperacillin-tazobactam (ZOSYN) IVPB 3.375 g 3.375 g 12.5 mL/hr   09/27/20 0527 New Bag/Given   piperacillin-tazobactam (ZOSYN) IVPB 3.375 g 3.375 g 12.5 mL/hr   09/27/20 1159 New Bag/Given   piperacillin-tazobactam (ZOSYN) 2.25 g in sodium chloride 0.9 % 50 mL IVPB 2.25 g 100 mL/hr   09/27/20 1711 New Bag/Given   piperacillin-tazobactam (ZOSYN) 2.25 g in sodium chloride 0.9 % 50 mL IVPB 2.25 g 100 mL/hr   09/28/20 0017 New Bag/Given   piperacillin-tazobactam (ZOSYN) 2.25 g in sodium chloride 0.9 % 50 mL IVPB 2.25 g 100 mL/hr   09/28/20 0522 New Bag/Given   piperacillin-tazobactam (ZOSYN) 2.25 g in sodium chloride 0.9 % 50 mL IVPB 2.25 g 100 mL/hr   09/28/20 1747 New Bag/Given   piperacillin-tazobactam (ZOSYN) IVPB 2.25 g 2.25 g 100 mL/hr   09/28/20 2310 New Bag/Given   piperacillin-tazobactam (ZOSYN) IVPB 2.25 g 2.25 g 100 mL/hr   09/29/20 0510 New Bag/Given   piperacillin-tazobactam (ZOSYN) IVPB 2.25 g 2.25 g 100 mL/hr   09/29/20 1337 New Bag/Given   piperacillin-tazobactam (ZOSYN) IVPB 2.25 g 2.25 g 100 mL/hr     . chlorhexidine  gluconate (MEDLINE KIT)  15 mL Mouth Rinse BID  . Chlorhexidine Gluconate Cloth  6 each Topical Daily  . docusate  100 mg Per Tube BID  . feeding supplement (PROSource TF)  90 mL Per Tube QID  . feeding supplement (VITAL HIGH PROTEIN)  1,000 mL Per Tube Q24H  . free water  30 mL Per Tube Q4H  . heparin injection (subcutaneous)  5,000 Units Subcutaneous Q8H  . hydrocortisone sod succinate (SOLU-CORTEF) inj  50 mg Intravenous Q12H  . insulin aspart  0-20 Units Subcutaneous Q4H  . insulin glargine  10 Units Subcutaneous QHS  . mouth rinse  15 mL Mouth Rinse 10 times per day  . midodrine  10 mg Per Tube TID WC  . multivitamin  1 tablet Per Tube QHS  . [START ON 09/30/2020] multivitamin  15 mL Per Tube Daily  . pantoprazole (PROTONIX) IV  40 mg Intravenous Q24H  . polyethylene glycol  17 g Per Tube Daily  . risperiDONE  1 mg Oral Once  . sodium chloride flush  3 mL Intravenous Q12H  . sodium chloride flush  5 mL Intracatheter Q8H  . zolpidem  5 mg Oral Once  . zolpidem  5 mg Per Tube QHS    Objective: Vital signs in last 24 hours: Temp:  [97.4 F (36.3 C)-98.1 F (36.7 C)] 97.7 F (36.5 C) (05/16 1030) Pulse Rate:  [51-65] 53 (05/16 1240) Resp:  [16] 16 (  05/16 1240) BP: (82-125)/(55-81) 100/64 (05/16 1400) SpO2:  [93 %-98 %] 96 % (05/16 1240) FiO2 (%):  [30 %] 30 % (05/16 1127) Weight:  [119.3 kg] 119.3 kg (05/16 0500) Constitutional: obese sedated HENT: anicteric Mouth/Throat: ETT in place Cardiovascular: Normal rate, regular rhythm and normal heart sounds. Pulmonary/Chest: Effort normal and breath sounds normal. No respiratory distress. He has no wheezes.  HD cath in palce Abdominal: Soft. JP drain RUQ with mindrainage Lymphadenopathy: He has no cervical adenopathy.  Neurological:sedated Skin: Skin is warm and dry. No rash noted. No erythema.  Lab Results Recent Labs    09/28/20 0418 09/29/20 0456  WBC 36.9* 27.0*  HGB 11.5* 10.5*  HCT 33.4* 30.1*  NA 134* 131*  K 4.3  3.8  CL 93* 87*  CO2 25 28  BUN 74* 86*  CREATININE 5.42* 6.13*    Microbiology: Results for orders placed or performed during the hospital encounter of 09/25/20  Culture, blood (routine x 2)     Status: Abnormal   Collection Time: 09/25/20  2:41 PM   Specimen: BLOOD  Result Value Ref Range Status   Specimen Description   Final    BLOOD RIGHT ANTECUBITAL Performed at [facility], [address] [place], [place] [address]    Special Requests   Final    BOTTLES DRAWN AEROBIC AND ANAEROBIC Blood Culture results may not be optimal due to an excessive volume of blood received in culture bottles Performed at [facility], [address] [place], [place] [address]    Culture  Setup Time   Final    Organism ID to follow [place] CRITICAL RESULT CALLED TO, READ BACK BY AND VERIFIED WITH: [place] @0802  ON 09/26/20.[place] Performed at [facility], [address] [place], [place] [address]    Culture ENTEROCOCCUS FAECALIS (A)  Final   Report Status 09/28/2020 FINAL  Final   Organism ID, Bacteria ENTEROCOCCUS FAECALIS  Final      Susceptibility   Enterococcus faecalis - MIC*    AMPICILLIN <=2 SENSITIVE Sensitive     VANCOMYCIN 1 SENSITIVE Sensitive     GENTAMICIN SYNERGY SENSITIVE Sensitive     * ENTEROCOCCUS FAECALIS  Blood Culture ID Panel (Reflexed)     Status: Abnormal   Collection Time: 09/25/20  2:41 PM  Result Value Ref Range Status   Enterococcus faecalis DETECTED (A) NOT DETECTED Final    Comment: CRITICAL RESULT CALLED TO, READ BACK BY AND VERIFIED WITH: LISA KLUTTZ @0802  ON 09/26/20.[place]    Enterococcus Faecium NOT DETECTED NOT DETECTED Final   Listeria monocytogenes NOT DETECTED NOT DETECTED Final   Staphylococcus species NOT DETECTED NOT DETECTED Final   Staphylococcus aureus (BCID) NOT DETECTED NOT DETECTED Final   Staphylococcus epidermidis NOT DETECTED NOT DETECTED Final    Staphylococcus lugdunensis NOT DETECTED NOT DETECTED Final   Streptococcus species NOT DETECTED NOT DETECTED Final   Streptococcus agalactiae NOT DETECTED NOT DETECTED Final   Streptococcus pneumoniae NOT DETECTED NOT DETECTED Final   Streptococcus pyogenes NOT DETECTED NOT DETECTED Final   A.calcoaceticus-baumannii NOT DETECTED NOT DETECTED Final   Bacteroides fragilis DETECTED (A) NOT DETECTED Final    Comment: CRITICAL RESULT CALLED TO, READ BACK BY AND VERIFIED WITH: LISA KLUTTZ @0802  ON 09/26/20.SH    Enterobacterales NOT DETECTED NOT DETECTED Final   Enterobacter cloacae complex NOT DETECTED NOT DETECTED Final   Escherichia coli NOT DETECTED NOT DETECTED Final   Klebsiella aerogenes NOT DETECTED  NOT DETECTED Final   Klebsiella oxytoca NOT DETECTED NOT DETECTED Final   Klebsiella pneumoniae NOT DETECTED NOT DETECTED Final   Proteus species NOT DETECTED NOT DETECTED Final   Salmonella species NOT DETECTED NOT DETECTED Final   Serratia marcescens NOT DETECTED NOT DETECTED Final   Haemophilus influenzae NOT DETECTED NOT DETECTED Final   Neisseria meningitidis NOT DETECTED NOT DETECTED Final   Pseudomonas aeruginosa NOT DETECTED NOT DETECTED Final   Stenotrophomonas maltophilia NOT DETECTED NOT DETECTED Final   Candida albicans NOT DETECTED NOT DETECTED Final   Candida auris NOT DETECTED NOT DETECTED Final   Candida glabrata NOT DETECTED NOT DETECTED Final   Candida krusei NOT DETECTED NOT DETECTED Final   Candida parapsilosis NOT DETECTED NOT DETECTED Final   Candida tropicalis NOT DETECTED NOT DETECTED Final   Cryptococcus neoformans/gattii NOT DETECTED NOT DETECTED Final   Vancomycin resistance NOT DETECTED NOT DETECTED Final    Comment: Performed at [facility], [address] [place], [place] [address]  Culture, blood (routine x 2)     Status: None (Preliminary result)   Collection Time: 09/25/20  3:42 PM   Specimen: BLOOD  Result Value Ref Range Status    Specimen Description BLOOD BLOOD LEFT HAND  Final   Special Requests   Final    BOTTLES DRAWN AEROBIC AND ANAEROBIC Blood Culture adequate volume   Culture   Final    NO GROWTH 4 DAYS Performed at [facility], [address] [place], [place] [address]    Report Status PENDING  Incomplete  Resp Panel by RT-PCR (Flu A&B, Covid) Nasopharyngeal Swab     Status: None   Collection Time: 09/25/20  3:51 PM   Specimen: Nasopharyngeal Swab; Nasopharyngeal(NP) swabs in vial transport medium  Result Value Ref Range Status   SARS Coronavirus 2 by RT PCR NEGATIVE NEGATIVE Final    Comment: (NOTE) SARS-CoV-2 target nucleic acids are NOT DETECTED.  The SARS-CoV-2 RNA is generally detectable in upper respiratory specimens during the acute phase of infection. The lowest concentration of SARS-CoV-2 viral copies this assay can detect is 138 copies/mL. A negative result does not preclude SARS-Cov-2 infection and should not be used as the sole basis for treatment or other patient management decisions. A negative result may occur with  improper specimen collection/handling, submission of specimen other than nasopharyngeal swab, presence of viral mutation(s) within the areas targeted by this assay, and inadequate number of viral copies(<138 copies/mL). A negative result must be combined with clinical observations, patient history, and epidemiological information. The expected result is Negative.  Fact Sheet for Patients:  EntrepreneurPulse.com.au  Fact Sheet for Healthcare Providers:  IncredibleEmployment.be  This test is no t yet approved or cleared by the [place] FDA and  has been authorized for detection and/or diagnosis of SARS-CoV-2 by FDA under an Emergency Use Authorization (EUA). This EUA will remain  in effect (meaning this test can be used) for the duration of the COVID-19 declaration under Section 564(b)(1) of the Act, 21 U.S.C.section  360bbb-3(b)(1), unless the authorization is terminated  or revoked sooner.       Influenza A by PCR NEGATIVE NEGATIVE Final   Influenza B by PCR NEGATIVE NEGATIVE Final    Comment: (NOTE) The Xpert Xpress SARS-CoV-2/FLU/RSV plus assay is intended as an aid in the diagnosis of influenza from Nasopharyngeal swab specimens and should not be used as a sole basis for treatment. Nasal washings and aspirates are unacceptable for Xpert Xpress SARS-CoV-2/FLU/RSV testing.  Fact Sheet  for Patients: EntrepreneurPulse.com.au  Fact Sheet for Healthcare Providers: IncredibleEmployment.be  This test is not yet approved or cleared by the [place] FDA and has been authorized for detection and/or diagnosis of SARS-CoV-2 by FDA under an Emergency Use Authorization (EUA). This EUA will remain in effect (meaning this test can be used) for the duration of the COVID-19 declaration under Section 564(b)(1) of the Act, 21 U.S.C. section 360bbb-3(b)(1), unless the authorization is terminated or revoked.  Performed at [facility], [address] [place], [place] [address]   MRSA PCR Screening     Status: None   Collection Time: 09/25/20  6:44 PM   Specimen: Nasopharyngeal  Result Value Ref Range Status   MRSA by PCR NEGATIVE NEGATIVE Final    Comment:        The GeneXpert MRSA Assay (FDA approved for NASAL specimens only), is one component of a comprehensive MRSA colonization surveillance program. It is not intended to diagnose MRSA infection nor to guide or monitor treatment for MRSA infections. Performed at [facility], [address], [place], [place] [address]   Aerobic/Anaerobic Culture (surgical/deep wound)     Status: None   Collection Time: 09/25/20  9:02 PM   Specimen: Abscess  Result Value Ref Range Status   Specimen Description   Final    ABSCESS Performed at [facility], [address], [place], [place]  [address]    Special Requests   Final    Normal Performed at [place], [place] [address]    Gram Stain   Final    ABUNDANT WBC PRESENT,BOTH PMN AND MONONUCLEAR MODERATE GRAM NEGATIVE COCCOBACILLI    Culture   Final    ABUNDANT ENTEROCOCCUS FAECALIS MODERATE BACTEROIDES FRAGILIS BETA LACTAMASE POSITIVE Performed at [facility], [place] [address] [place], [place] [address]    Report Status 09/28/2020 FINAL  Final   Organism ID, Bacteria ENTEROCOCCUS FAECALIS  Final      Susceptibility   Enterococcus faecalis - MIC*    AMPICILLIN <=2 SENSITIVE Sensitive     VANCOMYCIN 1 SENSITIVE Sensitive     GENTAMICIN SYNERGY SENSITIVE Sensitive     * ABUNDANT ENTEROCOCCUS FAECALIS  Urine culture     Status: None   Collection Time: 09/26/20  1:36 PM   Specimen: Urine, Random  Result Value Ref Range Status   Specimen Description   Final    URINE, RANDOM Performed at [facility], [address], [place], [place] [address]    Special Requests   Final    NONE Performed at [facility], [address] [place], [place] [address]    Culture   Final    NO GROWTH Performed at [facility], [place] [address], [place], [place] [address]    Report Status 09/28/2020 FINAL  Final     Studies/Results: DG Abd 1 View  Result Date: 09/29/2020 CLINICAL DATA:  NG tube placement EXAM: ABDOMEN - 1 VIEW COMPARISON:  09/25/2020 FINDINGS: Limited radiograph of the lower chest and upper abdomen was obtained for the purposes of enteric tube localization. Enteric tube is seen coursing below the diaphragm with distal tip and side port terminating within the expected location of the gastric body. A pigtail drainage catheter is partially visualized within the right upper quadrant of the abdomen. IMPRESSION: Enteric tube tip and side port project within the expected location of the gastric body. Electronically Signed   By: Davina Poke D.O.  On:  09/29/2020 08:09   DG Chest Port 1 View  Result Date: 09/28/2020 CLINICAL DATA:  Evaluate ET tube placement EXAM: PORTABLE CHEST 1 VIEW COMPARISON:  09/27/2020 FINDINGS: Endotracheal tube tip is above the carina. There is a left IJ central venous catheter with tip in the projection of the SVC. Decreased lung volumes and small bilateral pleural effusions. Atelectasis noted in the left lung base. IMPRESSION: 1. Decreased lung volumes with small bilateral pleural effusions. 2. Left base atelectasis. Electronically Signed   By: Kerby Moors M.D.   On: 09/28/2020 08:07   ECHOCARDIOGRAM COMPLETE  Result Date: 09/28/2020    ECHOCARDIOGRAM REPORT   Patient Name:   Richard Oconnell [facility] Date of Exam: 09/27/2020 Medical Rec #:  [id number]            Height:       76.0 in Accession #:    [id number]           Weight:       235.9 lb Date of Birth:  1955/12/30             BSA:          2.376 m Patient Age:    82 years             BP:           99/75 mmHg Patient Gender: M                    HR:           61 bpm. Exam Location:  [facility] Procedure: 2D Echo, Cardiac Doppler and Color Doppler Indications:     Bacteremia R78.81  History:         Patient has no prior history of Echocardiogram examinations.                  Risk Factors:Hypertension and Diabetes.  Sonographer:     Alyse Low Roar Referring Phys:  [facility] Diagnosing Phys: Ida Rogue MD IMPRESSIONS  1. Left ventricular ejection fraction, by estimation, is 60 to 65%. The left ventricle has normal function. The left ventricle has no regional wall motion abnormalities. Left ventricular diastolic parameters are consistent with Grade II diastolic dysfunction (pseudonormalization).  2. Right ventricular systolic function is normal. The right ventricular size is normal. There is normal pulmonary artery systolic pressure. The estimated right ventricular systolic pressure is 09.2 mmHg.  3. The mitral valve is normal in structure. No evidence of mitral valve  regurgitation. No evidence of mitral stenosis.  4. No valve vegetation. FINDINGS  Left Ventricle: Left ventricular ejection fraction, by estimation, is 60 to 65%. The left ventricle has normal function. The left ventricle has no regional wall motion abnormalities. The left ventricular internal cavity size was normal in size. There is  no left ventricular hypertrophy. Left ventricular diastolic parameters are consistent with Grade II diastolic dysfunction (pseudonormalization). Right Ventricle: The right ventricular size is normal. No increase in right ventricular wall thickness. Right ventricular systolic function is normal. There is normal pulmonary artery systolic pressure. The tricuspid regurgitant velocity is 2.03 m/s, and  with an assumed right atrial pressure of 5 mmHg, the estimated right ventricular systolic pressure is 33.0 mmHg. Left Atrium: Left atrial size was normal in size. Right Atrium: Right atrial size was normal in size. Pericardium: There is no evidence of pericardial effusion. Mitral Valve: The mitral valve is normal in structure. No evidence of mitral valve regurgitation. No evidence of  mitral valve stenosis. Tricuspid Valve: The tricuspid valve is normal in structure. Tricuspid valve regurgitation is not demonstrated. No evidence of tricuspid stenosis. Aortic Valve: The aortic valve is normal in structure. Aortic valve regurgitation is not visualized. No aortic stenosis is present. Aortic valve peak gradient measures 2.3 mmHg. Pulmonic Valve: The pulmonic valve was normal in structure. Pulmonic valve regurgitation is not visualized. No evidence of pulmonic stenosis. Aorta: The aortic root is normal in size and structure. Venous: The inferior vena cava is normal in size with greater than 50% respiratory variability, suggesting right atrial pressure of 3 mmHg. IAS/Shunts: No atrial level shunt detected by color flow Doppler.  LEFT VENTRICLE PLAX 2D LVIDd:         5.27 cm  Diastology LVIDs:          4.09 cm  LV e' medial:    6.31 cm/s LV PW:         1.04 cm  LV E/e' medial:  9.8 LV IVS:        1.21 cm  LV e' lateral:   9.14 cm/s LVOT diam:     2.20 cm  LV E/e' lateral: 6.8 LVOT Area:     3.80 cm  LEFT ATRIUM         Index LA diam:    3.80 cm 1.60 cm/m  AORTIC VALVE               PULMONIC VALVE AV Area (Vmax): 2.58 cm   PV Vmax:        0.72 m/s AV Vmax:        75.60 cm/s PV Peak grad:   2.1 mmHg AV Peak Grad:   2.3 mmHg   RVOT Peak grad: 1 mmHg LVOT Vmax:      51.40 cm/s  AORTA Ao Root diam: 3.40 cm MITRAL VALVE               TRICUSPID VALVE MV Area (PHT): 2.91 cm    TR Peak grad:   16.5 mmHg MV Decel Time: 261 msec    TR Vmax:        203.00 cm/s MV E velocity: 62.10 cm/s MV A velocity: 42.00 cm/s  SHUNTS MV E/A ratio:  1.48        Systemic Diam: 2.20 cm MV A Prime:    6.0 cm/s Ida Rogue MD Electronically signed by Ida Rogue MD Signature Date/Time: 09/28/2020/8:13:00 PM    Final     Assessment/Plan: Ramadan Couey is a 65 y.o. male  ith a history of type 2 diabetes, hypertension, hyperlipidemia, has had a complicated course since February 2022 when he was admitted with acute cholecystitis.  He was treated with initially IV antibiotics and then transition to oral antibiotics.  He was high risk for surgery so IR performed a percutaneous cholecystotomy on February 24.  He followed up with general surgery and had lap chole 4/25.  DC'd with a drain in place as well as on Augmentin for 7 days.  Drain was removed May 3.  He had not really been able to take good care of it at home.  He now presents with sepsis, chills, abdominal pain and found to have a very large intra-abdominal abscess.  He remained septic in the ICU on pressors.  Cultures of blood and abscess are growing Enterococcus.  5/16 - wbc still elevated but decreasing. TTE negative Recommendations Continue Zosyn Given continued increased wbc and on pressors add empiric fluconazole. Continue drainage. May consider TEE.  If wbc  remains elevated or increasing would repeat CT abd  Thank you very much for the consult. Will follow with you.  Leonel Ramsay   09/29/2020, 2:38 PM

## 2020-09-29 NOTE — Progress Notes (Signed)
Wake up assessment started: Propofol stopped at 1415 Fentanyl stopped at 1430  Patient opens eye to voice but unable to follow commands.

## 2020-09-29 NOTE — Progress Notes (Signed)
[facility], [place] 09/29/20  Subjective:   Hospital day # 4  Patient is critically ill    Renal: 05/15 0701 - 05/16 0700 In: 3931.8 [I.V.:3781.7; IV Piggyback:150] Out: 975 [Urine:950; Emesis/NG output:25] Lab Results  Component Value Date   CREATININE 6.13 (H) 09/29/2020   CREATININE 5.42 (H) 09/28/2020   CREATININE 5.42 (H) 09/27/2020    no acute events Remains sedated with propofol and fentanyl Levophed is being weaned off Fio2 30%  Objective:  Vital signs in last 24 hours:  Temp:  [97.4 F (36.3 C)-98.1 F (36.7 C)] 98 F (36.7 C) (05/16 0400) Pulse Rate:  [52-65] 62 (05/16 0600) Resp:  [16] 16 (05/16 0600) BP: (82-125)/(55-81) 87/60 (05/16 0600) SpO2:  [93 %-100 %] 96 % (05/16 0744) FiO2 (%):  [30 %-35 %] 30 % (05/16 0744) Weight:  [119.3 kg] 119.3 kg (05/16 0500)  Weight change: 6.6 kg Filed Weights   09/26/20 0500 09/28/20 0340 09/29/20 0500  Weight: 107 kg 112.7 kg 119.3 kg    Intake/Output:    Intake/Output Summary (Last 24 hours) at 09/29/2020 0831 Last data filed at 09/29/2020 0600 Gross per 24 hour  Intake 3931.77 ml  Output 975 ml  Net 2956.77 ml    Physical Exam: General:  No acute distress, laying in the bed  HEENT   ETT in place  Pulm/lungs   Vent assisted  CVS/Heart  regular rhythm, no rub or gallop  Abdomen:   Soft, nontender, gall bladder drain in place  Extremities:  No peripheral edema  Neurologic:  sedated  Skin:  No acute rashes   foley in place Left IJ tempo cath 09/27/20- ICU team  Basic Metabolic Panel:  Recent Labs  Lab 09/25/20 1444 09/25/20 1909 09/26/20 0420 09/27/20 0416 09/28/20 0418 09/29/20 0456  NA 135  --  135 131* 134* 131*  K 3.1*  --  3.9 4.6 4.3 3.8  CL 96*  --  98 95* 93* 87*  CO2 17*  --  17* 19* 25 28  GLUCOSE 84  --  148* 241* 230* 228*  BUN 46*  --  51* 74* 74* 86*  CREATININE 4.89*  --  4.76* 5.42* 5.42* 6.13*  CALCIUM 8.5*  --  7.9* 6.8* 7.1* 6.7*  MG  --  2.3  2.4 2.5* 2.4 2.5*  PHOS  --  5.1* 7.5* 8.4* 8.8* 8.6*     CBC: Recent Labs  Lab 09/25/20 1444 09/26/20 0420 09/27/20 0416 09/28/20 0418 09/29/20 0456  WBC 25.5* 45.5* 29.2* 36.9* 27.0*  NEUTROABS 24.0*  --  26.0* 32.5* 23.2*  HGB 11.5* 10.3* 10.7* 11.5* 10.5*  HCT 34.1* 31.2* 30.7* 33.4* 30.1*  MCV 80.4 81.9 77.9* 79.1* 78.0*  PLT 535* 524* 465* 562* 475*      Lab Results  Component Value Date   HEPBSAG NON REACTIVE 09/26/2020   HEPBSAB NON REACTIVE 09/26/2020   HEPBIGM NON REACTIVE 09/26/2020      Microbiology:  Recent Results (from the past 240 hour(s))  Culture, blood (routine x 2)     Status: Abnormal   Collection Time: 09/25/20  2:41 PM   Specimen: BLOOD  Result Value Ref Range Status   Specimen Description   Final    BLOOD RIGHT ANTECUBITAL Performed at [facility], [address] [place],  [address]    Special Requests   Final    BOTTLES DRAWN AEROBIC AND ANAEROBIC Blood Culture results may not be optimal due to an excessive volume of blood  received in culture bottles Performed at [facility], [address] [place], [place] [address]    Culture  Setup Time   Final    Organism ID to follow GRAM POSITIVE COCCI IN BOTH AEROBIC AND ANAEROBIC BOTTLES CRITICAL RESULT CALLED TO, READ BACK BY AND VERIFIED WITH: LISA KLUTTZ @0802  ON 09/26/20.[place] Performed at [facility], [address] [place], [place] [address]    Culture ENTEROCOCCUS FAECALIS (A)  Final   Report Status 09/28/2020 FINAL  Final   Organism ID, Bacteria ENTEROCOCCUS FAECALIS  Final      Susceptibility   Enterococcus faecalis - MIC*    AMPICILLIN <=2 SENSITIVE Sensitive     VANCOMYCIN 1 SENSITIVE Sensitive     GENTAMICIN SYNERGY SENSITIVE Sensitive     * ENTEROCOCCUS FAECALIS  Blood Culture ID Panel (Reflexed)     Status: Abnormal   Collection Time: 09/25/20  2:41 PM  Result Value Ref Range Status   Enterococcus faecalis DETECTED (A) NOT DETECTED  Final    Comment: CRITICAL RESULT CALLED TO, READ BACK BY AND VERIFIED WITH: LISA KLUTTZ @0802  ON 09/26/20.[place]    Enterococcus Faecium NOT DETECTED NOT DETECTED Final   Listeria monocytogenes NOT DETECTED NOT DETECTED Final   Staphylococcus species NOT DETECTED NOT DETECTED Final   Staphylococcus aureus (BCID) NOT DETECTED NOT DETECTED Final   Staphylococcus epidermidis NOT DETECTED NOT DETECTED Final   Staphylococcus lugdunensis NOT DETECTED NOT DETECTED Final   Streptococcus species NOT DETECTED NOT DETECTED Final   Streptococcus agalactiae NOT DETECTED NOT DETECTED Final   Streptococcus pneumoniae NOT DETECTED NOT DETECTED Final   Streptococcus pyogenes NOT DETECTED NOT DETECTED Final   A.calcoaceticus-baumannii NOT DETECTED NOT DETECTED Final   Bacteroides fragilis DETECTED (A) NOT DETECTED Final    Comment: CRITICAL RESULT CALLED TO, READ BACK BY AND VERIFIED WITH: LISA KLUTTZ @0802  ON 09/26/20.[place]    Enterobacterales NOT DETECTED NOT DETECTED Final   Enterobacter cloacae complex NOT DETECTED NOT DETECTED Final   Escherichia coli NOT DETECTED NOT DETECTED Final   Klebsiella aerogenes NOT DETECTED NOT DETECTED Final   Klebsiella oxytoca NOT DETECTED NOT DETECTED Final   Klebsiella pneumoniae NOT DETECTED NOT DETECTED Final   Proteus species NOT DETECTED NOT DETECTED Final   Salmonella species NOT DETECTED NOT DETECTED Final   Serratia marcescens NOT DETECTED NOT DETECTED Final   Haemophilus influenzae NOT DETECTED NOT DETECTED Final   Neisseria meningitidis NOT DETECTED NOT DETECTED Final   Pseudomonas aeruginosa NOT DETECTED NOT DETECTED Final   Stenotrophomonas maltophilia NOT DETECTED NOT DETECTED Final   Candida albicans NOT DETECTED NOT DETECTED Final   Candida auris NOT DETECTED NOT DETECTED Final   Candida glabrata NOT DETECTED NOT DETECTED Final   Candida krusei NOT DETECTED NOT DETECTED Final   Candida parapsilosis NOT DETECTED NOT DETECTED Final   Candida tropicalis  NOT DETECTED NOT DETECTED Final   Cryptococcus neoformans/gattii NOT DETECTED NOT DETECTED Final   Vancomycin resistance NOT DETECTED NOT DETECTED Final    Comment: Performed at [facility], [address] [place], [place] [address]  Culture, blood (routine x 2)     Status: None (Preliminary result)   Collection Time: 09/25/20  3:42 PM   Specimen: BLOOD  Result Value Ref Range Status   Specimen Description BLOOD BLOOD LEFT HAND  Final   Special Requests   Final    BOTTLES DRAWN AEROBIC AND ANAEROBIC Blood Culture adequate volume   Culture   Final    NO GROWTH 4  DAYS Performed at [facility], [address] [place], [place] [address]    Report Status PENDING  Incomplete  Resp Panel by RT-PCR (Flu A&B, Covid) Nasopharyngeal Swab     Status: None   Collection Time: 09/25/20  3:51 PM   Specimen: Nasopharyngeal Swab; Nasopharyngeal(NP) swabs in vial transport medium  Result Value Ref Range Status   SARS Coronavirus 2 by RT PCR NEGATIVE NEGATIVE Final    Comment: (NOTE) SARS-CoV-2 target nucleic acids are NOT DETECTED.  The SARS-CoV-2 RNA is generally detectable in upper respiratory specimens during the acute phase of infection. The lowest concentration of SARS-CoV-2 viral copies this assay can detect is 138 copies/mL. A negative result does not preclude SARS-Cov-2 infection and should not be used as the sole basis for treatment or other patient management decisions. A negative result may occur with  improper specimen collection/handling, submission of specimen other than nasopharyngeal swab, presence of viral mutation(s) within the areas targeted by this assay, and inadequate number of viral copies(<138 copies/mL). A negative result must be combined with clinical observations, patient history, and epidemiological information. The expected result is Negative.  Fact Sheet for Patients:  EntrepreneurPulse.com.au  Fact Sheet for Healthcare  Providers:  IncredibleEmployment.be  This test is no t yet approved or cleared by the [place] FDA and  has been authorized for detection and/or diagnosis of SARS-CoV-2 by FDA under an Emergency Use Authorization (EUA). This EUA will remain  in effect (meaning this test can be used) for the duration of the COVID-19 declaration under Section 564(b)(1) of the Act, 21 U.S.C.section 360bbb-3(b)(1), unless the authorization is terminated  or revoked sooner.       Influenza A by PCR NEGATIVE NEGATIVE Final   Influenza B by PCR NEGATIVE NEGATIVE Final    Comment: (NOTE) The Xpert Xpress SARS-CoV-2/FLU/RSV plus assay is intended as an aid in the diagnosis of influenza from Nasopharyngeal swab specimens and should not be used as a sole basis for treatment. Nasal washings and aspirates are unacceptable for Xpert Xpress SARS-CoV-2/FLU/RSV testing.  Fact Sheet for Patients: EntrepreneurPulse.com.au  Fact Sheet for Healthcare Providers: IncredibleEmployment.be  This test is not yet approved or cleared by the [place] FDA and has been authorized for detection and/or diagnosis of SARS-CoV-2 by FDA under an Emergency Use Authorization (EUA). This EUA will remain in effect (meaning this test can be used) for the duration of the COVID-19 declaration under Section 564(b)(1) of the Act, 21 U.S.C. section 360bbb-3(b)(1), unless the authorization is terminated or revoked.  Performed at [facility], [address] [place], [place] [address]   MRSA PCR Screening     Status: None   Collection Time: 09/25/20  6:44 PM   Specimen: Nasopharyngeal  Result Value Ref Range Status   MRSA by PCR NEGATIVE NEGATIVE Final    Comment:        The GeneXpert MRSA Assay (FDA approved for NASAL specimens only), is one component of a comprehensive MRSA colonization surveillance program. It is not intended to diagnose MRSA infection nor  to guide or monitor treatment for MRSA infections. Performed at [facility], [address] [place], [place] [address]   Aerobic/Anaerobic Culture (surgical/deep wound)     Status: None   Collection Time: 09/25/20  9:02 PM   Specimen: Abscess  Result Value Ref Range Status   Specimen Description   Final    ABSCESS Performed at [facility], [address] [place],  [address]    Special  Requests   Final    Normal Performed at [place], [place] [address]    Gram Stain   Final    ABUNDANT WBC PRESENT,BOTH PMN AND MONONUCLEAR MODERATE GRAM NEGATIVE COCCOBACILLI    Culture   Final    ABUNDANT ENTEROCOCCUS FAECALIS MODERATE BACTEROIDES FRAGILIS BETA LACTAMASE POSITIVE Performed at [facility], [address] [place], [place] [address]    Report Status 09/28/2020 FINAL  Final   Organism ID, Bacteria ENTEROCOCCUS FAECALIS  Final      Susceptibility   Enterococcus faecalis - MIC*    AMPICILLIN <=2 SENSITIVE Sensitive     VANCOMYCIN 1 SENSITIVE Sensitive     GENTAMICIN SYNERGY SENSITIVE Sensitive     * ABUNDANT ENTEROCOCCUS FAECALIS  Urine culture     Status: None   Collection Time: 09/26/20  1:36 PM   Specimen: Urine, Random  Result Value Ref Range Status   Specimen Description   Final    URINE, RANDOM Performed at [facility], [address] [place], [place] [address]    Special Requests   Final    NONE Performed at [facility], [address], [place], [place] [address]    Culture   Final    NO GROWTH Performed at [facility], [place] [address], [place], [place] [address]    Report Status 09/28/2020 FINAL  Final    Coagulation Studies: No results for input(s): LABPROT, INR in the last 72 hours.  Urinalysis: Recent Labs    09/26/20 1336  COLORURINE RED*  LABSPEC 1.020  PHURINE TEST NOT REPORTED DUE TO COLOR INTERFERENCE OF URINE PIGMENT  GLUCOSEU TEST NOT  REPORTED DUE TO COLOR INTERFERENCE OF URINE PIGMENT*  HGBUR TEST NOT REPORTED DUE TO COLOR INTERFERENCE OF URINE PIGMENT*  BILIRUBINUR TEST NOT REPORTED DUE TO COLOR INTERFERENCE OF URINE PIGMENT*  KETONESUR TEST NOT REPORTED DUE TO COLOR INTERFERENCE OF URINE PIGMENT*  PROTEINUR TEST NOT REPORTED DUE TO COLOR INTERFERENCE OF URINE PIGMENT*  NITRITE TEST NOT REPORTED DUE TO COLOR INTERFERENCE OF URINE PIGMENT*  LEUKOCYTESUR TEST NOT REPORTED DUE TO COLOR INTERFERENCE OF URINE PIGMENT*      Imaging: DG Abd 1 View  Result Date: 09/29/2020 CLINICAL DATA:  NG tube placement EXAM: ABDOMEN - 1 VIEW COMPARISON:  09/25/2020 FINDINGS: Limited radiograph of the lower chest and upper abdomen was obtained for the purposes of enteric tube localization. Enteric tube is seen coursing below the diaphragm with distal tip and side port terminating within the expected location of the gastric body. A pigtail drainage catheter is partially visualized within the right upper quadrant of the abdomen. IMPRESSION: Enteric tube tip and side port project within the expected location of the gastric body. Electronically Signed   By: Davina Poke D.O.   On: 09/29/2020 08:09   DG Chest Port 1 View  Result Date: 09/28/2020 CLINICAL DATA:  Evaluate ET tube placement EXAM: PORTABLE CHEST 1 VIEW COMPARISON:  09/27/2020 FINDINGS: Endotracheal tube tip is above the carina. There is a left IJ central venous catheter with tip in the projection of the SVC. Decreased lung volumes and small bilateral pleural effusions. Atelectasis noted in the left lung base. IMPRESSION: 1. Decreased lung volumes with small bilateral pleural effusions. 2. Left base atelectasis. Electronically Signed   By: Kerby Moors M.D.   On: 09/28/2020 08:07   DG Chest Port 1 View  Result Date: 09/27/2020 CLINICAL DATA:  Central line placement EXAM: PORTABLE CHEST  1 VIEW COMPARISON:  09/25/2020 FINDINGS: Interval placement of left neck large bore multi lumen  vascular catheter, tips projecting over the mid SVC. Interval placement of endotracheal tube, tip projecting over the mid trachea. Interval placement of esophagogastric tube, tip and side port well above the diaphragm, at least 5 cm. Mild, diffuse interstitial pulmonary opacity, unchanged compared to prior. IMPRESSION: 1. Interval placement of left neck large bore multi lumen vascular catheter, tips projecting over the mid SVC. 2. Interval placement of endotracheal tube, tip projecting over the mid trachea. 3. Interval placement of esophagogastric tube, tip and side port well above the diaphragm, at least 5 cm. Recommend advancement to ensure subdiaphragmatic positioning. Electronically Signed   By: Eddie Candle M.D.   On: 09/27/2020 12:00   ECHOCARDIOGRAM COMPLETE  Result Date: 09/28/2020    ECHOCARDIOGRAM REPORT   Patient Name:   [facility] Date of Exam: 09/27/2020 Medical Rec #:  [id number]            Height:       76.0 in Accession #:    [id number]           Weight:       235.9 lb Date of Birth:  Aug 10, 1955             BSA:          2.376 m Patient Age:    65 years             BP:           99/75 mmHg Patient Gender: M                    HR:           61 bpm. Exam Location:  [facility] Procedure: 2D Echo, Cardiac Doppler and Color Doppler Indications:     Bacteremia R78.81  History:         Patient has no prior history of Echocardiogram examinations.                  Risk Factors:Hypertension and Diabetes.  Sonographer:     Alyse Low Roar Referring Phys:  [place] Diagnosing Phys: Ida Rogue MD IMPRESSIONS  1. Left ventricular ejection fraction, by estimation, is 60 to 65%. The left ventricle has normal function. The left ventricle has no regional wall motion abnormalities. Left ventricular diastolic parameters are consistent with Grade II diastolic dysfunction (pseudonormalization).  2. Right ventricular systolic function is normal. The right ventricular size is normal. There is normal  pulmonary artery systolic pressure. The estimated right ventricular systolic pressure is 81.7 mmHg.  3. The mitral valve is normal in structure. No evidence of mitral valve regurgitation. No evidence of mitral stenosis.  4. No valve vegetation. FINDINGS  Left Ventricle: Left ventricular ejection fraction, by estimation, is 60 to 65%. The left ventricle has normal function. The left ventricle has no regional wall motion abnormalities. The left ventricular internal cavity size was normal in size. There is  no left ventricular hypertrophy. Left ventricular diastolic parameters are consistent with Grade II diastolic dysfunction (pseudonormalization). Right Ventricle: The right ventricular size is normal. No increase in right ventricular wall thickness. Right ventricular systolic function is normal. There is normal pulmonary artery systolic pressure. The tricuspid regurgitant velocity is 2.03 m/s, and  with an assumed right atrial pressure of 5 mmHg, the estimated right ventricular systolic pressure is 71.1 mmHg. Left Atrium: Left atrial size was normal in size. Right Atrium:  Right atrial size was normal in size. Pericardium: There is no evidence of pericardial effusion. Mitral Valve: The mitral valve is normal in structure. No evidence of mitral valve regurgitation. No evidence of mitral valve stenosis. Tricuspid Valve: The tricuspid valve is normal in structure. Tricuspid valve regurgitation is not demonstrated. No evidence of tricuspid stenosis. Aortic Valve: The aortic valve is normal in structure. Aortic valve regurgitation is not visualized. No aortic stenosis is present. Aortic valve peak gradient measures 2.3 mmHg. Pulmonic Valve: The pulmonic valve was normal in structure. Pulmonic valve regurgitation is not visualized. No evidence of pulmonic stenosis. Aorta: The aortic root is normal in size and structure. Venous: The inferior vena cava is normal in size with greater than 50% respiratory variability,  suggesting right atrial pressure of 3 mmHg. IAS/Shunts: No atrial level shunt detected by color flow Doppler.  LEFT VENTRICLE PLAX 2D LVIDd:         5.27 cm  Diastology LVIDs:         4.09 cm  LV e' medial:    6.31 cm/s LV PW:         1.04 cm  LV E/e' medial:  9.8 LV IVS:        1.21 cm  LV e' lateral:   9.14 cm/s LVOT diam:     2.20 cm  LV E/e' lateral: 6.8 LVOT Area:     3.80 cm  LEFT ATRIUM         Index LA diam:    3.80 cm 1.60 cm/m  AORTIC VALVE               PULMONIC VALVE AV Area (Vmax): 2.58 cm   PV Vmax:        0.72 m/s AV Vmax:        75.60 cm/s PV Peak grad:   2.1 mmHg AV Peak Grad:   2.3 mmHg   RVOT Peak grad: 1 mmHg LVOT Vmax:      51.40 cm/s  AORTA Ao Root diam: 3.40 cm MITRAL VALVE               TRICUSPID VALVE MV Area (PHT): 2.91 cm    TR Peak grad:   16.5 mmHg MV Decel Time: 261 msec    TR Vmax:        203.00 cm/s MV E velocity: 62.10 cm/s MV A velocity: 42.00 cm/s  SHUNTS MV E/A ratio:  1.48        Systemic Diam: 2.20 cm MV A Prime:    6.0 cm/s Ida Rogue MD Electronically signed by Ida Rogue MD Signature Date/Time: 09/28/2020/8:13:00 PM    Final      Medications:   . sodium chloride Stopped (09/25/20 1647)  . sodium chloride    . sodium chloride    . fentaNYL infusion INTRAVENOUS 125 mcg/hr (09/29/20 0600)  . midazolam    . norepinephrine (LEVOPHED) Adult infusion 7 mcg/min (09/29/20 0600)  . piperacillin-tazobactam (ZOSYN)  IV Stopped (09/29/20 0540)  . propofol (DIPRIVAN) infusion 40 mcg/kg/min (09/29/20 0808)  .  sodium bicarbonate (isotonic) infusion in sterile water 75 mL/hr at 09/29/20 0600   . chlorhexidine gluconate (MEDLINE KIT)  15 mL Mouth Rinse BID  . Chlorhexidine Gluconate Cloth  6 each Topical Daily  . docusate  100 mg Per Tube BID  . hydrocortisone sod succinate (SOLU-CORTEF) inj  50 mg Intravenous Q6H  . insulin aspart  0-20 Units Subcutaneous Q4H  . insulin glargine  10 Units Subcutaneous QHS  .  mouth rinse  15 mL Mouth Rinse 10 times per day  .  midodrine  10 mg Per Tube TID WC  . pantoprazole (PROTONIX) IV  40 mg Intravenous Q24H  . polyethylene glycol  17 g Per Tube Daily  . risperiDONE  1 mg Oral Once  . sodium chloride flush  3 mL Intravenous Q12H  . sodium chloride flush  5 mL Intracatheter Q8H  . zolpidem  5 mg Oral Once  . zolpidem  5 mg Per Tube QHS   sodium chloride, acetaminophen, albuterol, docusate, fentaNYL (SUBLIMAZE) injection, haloperidol lactate, midazolam, ondansetron (ZOFRAN) IV, polyethylene glycol, sodium chloride flush  Assessment/ Plan:  65 y.o. male with hypertension, diabetes mellitus type II, bladder cancer, admitted on 09/25/2020 for Septic shock ([place]) [A41.9, R65.21] Altered mental status, unspecified altered mental status type [R41.82] Postprocedural intraabdominal abscess [T81.43XA] Sepsis with acute renal failure and septic shock, due to unspecified organism, unspecified acute renal failure type ([place]) [A41.9, R65.21, N17.9]   # AKI  baseline creatinine of 1.1, GFR >60 on 08/19/20.  Lab Results  Component Value Date   CREATININE 6.13 (H) 09/29/2020   CREATININE 5.42 (H) 09/28/2020   CREATININE 5.42 (H) 09/27/2020   05/15 0701 - 05/16 0700 In: 3931.8 [I.V.:3781.7; IV Piggyback:150] Out: 827 [Urine:950; Emesis/NG output:25]  AKI likely secondary to ATN  fair UOP  increased BUN/Cr noted Will plan for HD today for metabolic optimization Assess daily  # Sepsis, Gall bladder Abscess, S/p robotic assisted Lap [place] 09/08/2020 S/p CT guided GB abscess drain placement by VIR, on 5/12 Blood culture positive for Bacteroides and enterococcus on 5/12 - requiring pressors -zosyn   # hyperphosphatemia -in setting of renal failure  - Expected to correct some with HD  # Acute resp failure Vent dependent now intubated 09/27/20    LOS: 4 Azarian Starace 5/16/20228:31 AM  [facility] [place], [place]  Note: This note was prepared with Dragon dictation. Any  transcription errors are unintentional

## 2020-09-29 NOTE — Progress Notes (Signed)
NAME:  Richard Oconnell, MRN:  [id number], DOB:  1956/01/26, LOS: 68 ADMISSION DATE:  09/25/2020 REFERRING MD:  Charna Archer CHIEF COMPLAINT:  Feeling bad and belly pain  Brief Pt Description/Synopsis:  65 yo male with previous dx of acute cholecystitis s/p cholecystotomy tube 2/24 and then follow up robotic assisted cholecystectomy 4/25, now presents with large abdominal abscess with severe septic shock and BACTEREMIA, with multiorgan failure (liver and renal failure) complicated by severe metabolic acidosis and toxic metabolic encephalopathy.  History of Present Illness:  65 year old man past medical history of hypertension, diabetes, and bladder cancer who was hospitalized with acute cholecystitis-several months ago 2/24 He was temporized with a percutaneous cholecystostomy tube.    Follow up  cholangiogram via the tube demonstrates that the cystic duct is still obstructed He was offered him a robot-assisted laparoscopic cholecystectomy, but  deferred this procedure  On 4/25 recently had cholecystectomy along with significant lysis of adhesions on 4/25 here at [facility].  He presents today with somnolence and confusion.   Family reports that patient is disoriented the day before yesterday, after which his skin has begun to appear increasingly yellow.    +discomfort in the right upper quadrant of his abdomen, liver failure, renal failure, severe acidosis Patient started on pressors, admitted for septic shock  WBC 25 LA 6.5-->8.7 CREAT 4.8 TB 9.5   Pertinent  Medical History  2/24 admitted for acute cholecystitis,percutaneous cholecystostomy tube placed by IR  3/29 offered  a robot-assisted laparoscopic cholecystectomy, but deferred  4/25 s/p cholecystectomy along with significant lysis of adhesions on 4/25 here at [facility].   Significant Hospital Events: Including procedures, antibiotic start and stop dates in addition to other pertinent events   2/24 admitted for acute  cholecystitis,percutaneous cholecystostomy tube placed by IR  3/29 offered  a robot-assisted laparoscopic cholecystectomy, but deferred  4/25 s/p cholecystectomy along with significant lysis of adhesions on 4/25 here at [facility]. 5/12 admission to ICU for large abd abscess, jaundice and septic shock, on pressors 5/12 CT abd-Large abscess in the gallbladder fossa, at least 10 cm in size; Percutaneous drain placed by IR 5/13: Nephrology following, starting Bicarb gtt, remains on Levophed; Blood cultures growing Enterococcus faecalis,Bacteroides fragilis.  Pt extremely delirious and agitated, trying to leave to go home, interfering with medical treatment, plan to start Precedex. High risk for intubation due to severe Encephalopathy 5/14severe sepsis, high risk for intubation, on precedex,  5/14 INTUBATED AND LEFT TRIALYSIS CATHETER PLACED 5/16: Plan for SBT following Hemodialysis   Micro Data:  09/25/2020: SARS-CoV-2 PCR>> negative 09/25/2020: Influenza PCR>> negative 09/25/2020: Blood culture x2>>Enterococcus faecalis,Bacteroides fragilis 09/25/2020: MRSA PCR>> negative 09/25/2020: Aerobic/anaerobic culture from abdominal abscess>> ENTEROCOCCUS FAECALIS, BACTEROIDES FRAGILIS, BETA LACTAMASE POSITIVE (sensitive to Ampicillin, Gentamycin, Vancomycin) 09/26/2020: Urine>>no growth  Antimicrobials:  Zosyn 5/12>>  Antibiotics Given (last 72 hours)    Date/Time Action Medication Dose Rate   09/26/20 1500 New Bag/Given   piperacillin-tazobactam (ZOSYN) IVPB 3.375 g 3.375 g 12.5 mL/hr   09/26/20 2253 New Bag/Given   piperacillin-tazobactam (ZOSYN) IVPB 3.375 g 3.375 g 12.5 mL/hr   09/27/20 0527 New Bag/Given   piperacillin-tazobactam (ZOSYN) IVPB 3.375 g 3.375 g 12.5 mL/hr   09/27/20 1159 New Bag/Given   piperacillin-tazobactam (ZOSYN) 2.25 g in sodium chloride 0.9 % 50 mL IVPB 2.25 g 100 mL/hr   09/27/20 1711 New Bag/Given   piperacillin-tazobactam (ZOSYN) 2.25 g in sodium chloride 0.9 % 50 mL IVPB  2.25 g 100 mL/hr   09/28/20 0017 New Bag/Given   piperacillin-tazobactam (ZOSYN)  2.25 g in sodium chloride 0.9 % 50 mL IVPB 2.25 g 100 mL/hr   09/28/20 0522 New Bag/Given   piperacillin-tazobactam (ZOSYN) 2.25 g in sodium chloride 0.9 % 50 mL IVPB 2.25 g 100 mL/hr   09/28/20 1747 New Bag/Given   piperacillin-tazobactam (ZOSYN) IVPB 2.25 g 2.25 g 100 mL/hr   09/28/20 2310 New Bag/Given   piperacillin-tazobactam (ZOSYN) IVPB 2.25 g 2.25 g 100 mL/hr   09/29/20 0510 New Bag/Given   piperacillin-tazobactam (ZOSYN) IVPB 2.25 g 2.25 g 100 mL/hr      Interval History:  -No acute events reported overnight -Afebrile, Hemodynamically stable, requiring Levophed (currently at 7 mcg) -WBC improved to 27 (previously 36.9) -Discussed with Nephrology, plan for HD today -On minimal vent settings, will plan for SBT today following HD   Objective   Blood pressure (!) 87/60, pulse 62, temperature 98 F (36.7 C), temperature source Axillary, resp. rate 16, height 6' 4"  (1.93 m), weight 119.3 kg, SpO2 96 %.    Vent Mode: PRVC FiO2 (%):  [30 %-35 %] 30 % Set Rate:  [16 bmp] 16 bmp Vt Set:  [500 mL] 500 mL PEEP:  [5 cmH20] 5 cmH20 Plateau Pressure:  [12 cmH20-14 cmH20] 14 cmH20   Intake/Output Summary (Last 24 hours) at 09/29/2020 0756 Last data filed at 09/29/2020 0600 Gross per 24 hour  Intake 3931.77 ml  Output 975 ml  Net 2956.77 ml   Filed Weights   09/26/20 0500 09/28/20 0340 09/29/20 0500  Weight: 107 kg 112.7 kg 119.3 kg     REVIEW OF SYSTEMS Unable to assess due to Intubation, sedation, critical illness   PHYSICAL EXAMINATION: GENERAL:acutely ill appearing made, laying in bed, intubated/sedated, in NAD HEAD: Normocephalic, atraumatic.  EYES: Pupils equal, round, reactive to light.  No scleral icterus.  MOUTH: Moist MM, ETT in place NECK: Supple. No thyromegaly PULMONARY: Clear breath sounds bilaterally, no wheezing or rales, synchronous with vent, even CARDIOVASCULAR: S1 and  S2. Regular rate and rhythm. No murmurs, rubs, or gallops. 2+ radial & DP pulses bilaterally GASTROINTESTINAL: Obese, nondistended, nontender, no guarding or rebound tenderness.  Percutaneous drain to RUQ is clean dry and intact   MUSCULOSKELETAL: Normal bulk and tone, no deformities,No swelling, clubbing, or edema.  NEUROLOGIC: Sedated, withdraws from pain, pupils PERRL SKIN: Warm and dry.  No obvious rashes, lesions, or ulcerations   Labs/imaging that I havepersonally reviewed  (right click and "Reselect all SmartList Selections" daily)  Labs 09/26/2020: Na 131, glucose 228, BUN 86, Cr. 6.13, AG 16, Alk. Phosphatase 213, AST 55 CXR 09/28/20:Endotracheal tube tip is above the carina. There is a left IJ central venous catheter with tip in the projection of the SVC. Decreased lung volumes and small bilateral pleural effusions. Atelectasis noted in the left lung base. CT abdomen and pelvis 09/25/2020>>Large abscess in the gallbladder fossa, at least 10 cm in size. No evidence free intraperitoneal fluid or distant abscess/collection. Atelectasis in both lower lobes. Aortic Atherosclerosis CT head w/o contrast 09/26/2018>>No acute or significant finding.  Mild generalized volume loss.    ASSESSMENT/PLAN   Acute Hypoxic Respiratory Failure in the setting of severe metabolic derangements due to septic shock, AKI, and Metabolic Encephalopathy -Full vent support, implement lung protective strategies -Wean FiO2 & PEEP as needed to maintain O2 sats >92% -Follow intermittent CXR & ABG as needed -Implement VAP bundle -SBT's when respiratory parameters met and mental status permits -Prn Bronchodilators  Septic Shock -Continuous cardiac monitoring -Maintain MAP greater than 65 -IV fluids -Vasopressors as needed  to maintain MAP goal -Stress dose steroids -Continue Midodrine -Echocardiogram 09/27/20 with LVEF 60-65%, grade II diastolic dysfunction, RV systolic function normal; normal valves & no  vegetations  Severe sepsis due to intra-abdominal abscess and BACTEREMIA (Enterococcus faecalis, Bacteroides fragilis) -Monitor fever curve -Trend WBCs and procalcitonin -Follow cultures as above -Continue Zosyn  -ID following, appreciate input, will defer ABX to ID -Status post Percutaneous drain placement by IR into gallbladder fossa abscess on 09/25/2020 for source control -General surgery following, appreciate input  Acute Kidney Injury Anion Gap Metabolic Acidosis -Monitor I&O's / urinary output -Follow BMP -Ensure adequate renal perfusion -Avoid nephrotoxic agents as able -Replace electrolytes as indicated -IV fluids -Bicarb gtt -Nephrology following, appreciate input -Plan for HD today 5/16  Acute toxic metabolic encephalopathy, suspect in setting of Sepsis, AKI, and sleep deprivation Sedation needs in setting of mechanical ventilation -Maintain RASS of -1 -Fentanyl and Propofol as needed -Avoid sedating meds as able -Daily wake up assessment -CT Head 09/25/20 negative -Urine drug screen + for Benzodiazepines  Anemia without s/sx of overt blood loss -Monitor for S/Sx of bleeding -Trend CBC -SCD's for VTE Prophylaxis  -Transfuse for Hgb <7  Hyperglycemia -CBG's -SSI -Follow ICU Hypo/Hyperglycemia protocol.   Pt is critically ill with multiorgan failure.  Prognosis is guarded. High risk for cardiac arrest and death.   Best practice (right click and "Reselect all SmartList Selections" daily)  Diet: NPO Pain/Anxiety/Delirium protocol (if indicated): Fentanyl, Propofol VAP protocol (if indicated): Yes, implemented DVT prophylaxis: SCD's GI prophylaxis: PPI Glucose control:  SSI Central venous access:  Yes, and still needed Arterial line:  N/A Foley:  Yes, and still needed Mobility:  bed rest  PT consulted: N/A Code Status:  full code Disposition: ICU   Labs   CBC: Recent Labs  Lab 09/25/20 1444 09/26/20 0420 09/27/20 0416 09/28/20 0418  09/29/20 0456  WBC 25.5* 45.5* 29.2* 36.9* 27.0*  NEUTROABS 24.0*  --  26.0* 32.5* 23.2*  HGB 11.5* 10.3* 10.7* 11.5* 10.5*  HCT 34.1* 31.2* 30.7* 33.4* 30.1*  MCV 80.4 81.9 77.9* 79.1* 78.0*  PLT 535* 524* 465* 562* 475*    Basic Metabolic Panel: Recent Labs  Lab 09/25/20 1444 09/25/20 1909 09/26/20 0420 09/27/20 0416 09/28/20 0418 09/29/20 0456  NA 135  --  135 131* 134* 131*  K 3.1*  --  3.9 4.6 4.3 3.8  CL 96*  --  98 95* 93* 87*  CO2 17*  --  17* 19* 25 28  GLUCOSE 84  --  148* 241* 230* 228*  BUN 46*  --  51* 74* 74* 86*  CREATININE 4.89*  --  4.76* 5.42* 5.42* 6.13*  CALCIUM 8.5*  --  7.9* 6.8* 7.1* 6.7*  MG  --  2.3 2.4 2.5* 2.4 2.5*  PHOS  --  5.1* 7.5* 8.4* 8.8* 8.6*   GFR: Estimated Creatinine Clearance: 17 mL/min (A) (by C-G formula based on SCr of 6.13 mg/dL (H)). Recent Labs  Lab 09/25/20 1502 09/25/20 1905 09/25/20 2151 09/26/20 0420 09/27/20 0416 09/28/20 0418 09/29/20 0456  PROCALCITON  --   --   --  148.71 99.94 56.30  --   WBC  --   --   --  45.5* 29.2* 36.9* 27.0*  LATICACIDVEN 8.7*  6.5* 4.4* 3.1*  --   --   --   --     Liver Function Tests: Recent Labs  Lab 09/25/20 1444 09/26/20 0420 09/27/20 0416 09/28/20 0418 09/29/20 0456  AST 121* 133* 100* 91* 55*  ALT 56* 56* 48* 53* 42  ALKPHOS 459* 360* 279* 255* 213*  BILITOT 9.5* 6.7* 3.9* 2.9* 2.2*  PROT 8.2* 7.6 6.8 6.3* 5.8*  ALBUMIN 2.6* 2.4* 2.0* 1.9* 1.8*   No results for input(s): LIPASE, AMYLASE in the last 168 hours. Recent Labs  Lab 09/25/20 1502  AMMONIA 19    ABG    Component Value Date/Time   PHART 7.35 09/27/2020 1155   PCO2ART 42 09/27/2020 1155   PO2ART 71 (L) 09/27/2020 1155   HCO3 23.2 09/27/2020 1155   ACIDBASEDEF 2.3 (H) 09/27/2020 1155   O2SAT 93.1 09/27/2020 1155     Coagulation Profile: Recent Labs  Lab 09/25/20 1444  INR 1.5*    Cardiac Enzymes: No results for input(s): CKTOTAL, CKMB, CKMBINDEX, TROPONINI in the last 168  hours.  HbA1C: Hemoglobin A1C  Date/Time Value Ref Range Status  12/27/2019 12:00 AM 6.9  Final    Comment:    [facility]   Hgb A1c MFr Bld  Date/Time Value Ref Range Status  09/26/2020 04:20 AM 6.7 (H) 4.8 - 5.6 % Final    Comment:    (NOTE) Pre diabetes:          5.7%-6.4%  Diabetes:              >6.4%  Glycemic control for   <7.0% adults with diabetes   05/30/2020 10:47 AM 7.2 (H) <5.7 % of total Hgb Final    Comment:    For someone without known diabetes, a hemoglobin A1c value of 6.5% or greater indicates that they may have  diabetes and this should be confirmed with a follow-up  test. . For someone with known diabetes, a value <7% indicates  that their diabetes is well controlled and a value  greater than or equal to 7% indicates suboptimal  control. A1c targets should be individualized based on  duration of diabetes, age, comorbid conditions, and  other considerations. . Currently, no consensus exists regarding use of hemoglobin A1c for diagnosis of diabetes for children. .     CBG: Recent Labs  Lab 09/28/20 1547 09/28/20 1928 09/28/20 2313 09/29/20 0338 09/29/20 0721  GLUCAP 208* 207* 178* 218* 197*     Past Medical History:  He,  has a past medical history of Cancer ([place]), Erectile dysfunction associated with type 2 diabetes mellitus ([place]) (05/26/2016), Gross hematuria (11/16/2016), Hypertension (06/11/2015), and Type 2 diabetes mellitus with diabetic polyneuropathy, without long-term current use of insulin ([place]) (06/11/2015).   Surgical History:   Past Surgical History:  Procedure Laterality Date  . boil lanceted     hand  . COLONOSCOPY WITH PROPOFOL N/A 07/03/2020   Procedure: COLONOSCOPY WITH PROPOFOL;  Surgeon: Jonathon Bellows, MD;  Location: [facility] ENDOSCOPY;  Service: Gastroenterology;  Laterality: N/A;  . CYSTOSCOPY W/ RETROGRADES Bilateral 12/06/2016   Procedure: CYSTOSCOPY WITH RETROGRADE PYELOGRAM;  Surgeon: Hollice Espy, MD;  Location:  [facility] ORS;  Service: Urology;  Laterality: Bilateral;  . CYSTOSCOPY W/ RETROGRADES Bilateral 06/27/2017   Procedure: CYSTOSCOPY WITH RETROGRADE PYELOGRAM;  Surgeon: Hollice Espy, MD;  Location: [facility] ORS;  Service: Urology;  Laterality: Bilateral;  . CYSTOSCOPY WITH BIOPSY N/A 06/27/2017   Procedure: CYSTOSCOPY WITH Bladder BIOPSY;  Surgeon: Hollice Espy, MD;  Location: [facility] ORS;  Service: Urology;  Laterality: N/A;  . CYSTOSCOPY WITH BIOPSY N/A 10/24/2017   Procedure: CYSTOSCOPY WITH Bladder BIOPSY;  Surgeon: Hollice Espy, MD;  Location: [facility] ORS;  Service: Urology;  Laterality: N/A;  . ingrown  Bilateral    ingrown  toenail  . IR BILIARY DRAIN PLACEMENT WITH CHOLANGIOGRAM  07/10/2020  . IR CHOLANGIOGRAM EXISTING TUBE  08/07/2020  . IR RADIOLOGIST EVAL & MGMT  08/07/2020  . TRANSURETHRAL RESECTION OF BLADDER TUMOR N/A 12/06/2016   Procedure: TRANSURETHRAL RESECTION OF BLADDER TUMOR (TURBT) (2-5cm) CLOT EVACUATION;  Surgeon: Hollice Espy, MD;  Location: [facility] ORS;  Service: Urology;  Laterality: N/A;  . TRANSURETHRAL RESECTION OF BLADDER TUMOR N/A 06/27/2017   Procedure: TRANSURETHRAL RESECTION OF BLADDER TUMOR (TURBT);  Surgeon: Hollice Espy, MD;  Location: [facility] ORS;  Service: Urology;  Laterality: N/A;  . TRANSURETHRAL RESECTION OF BLADDER TUMOR WITH MITOMYCIN-C N/A 01/18/2017   Procedure: TRANSURETHRAL RESECTION OF BLADDER TUMOR WITH MITOMYCIN-C-(SMALL);  Surgeon: Hollice Espy, MD;  Location: [facility] ORS;  Service: Urology;  Laterality: N/A;     Social History:   reports that he has never smoked. He has never used smokeless tobacco. He reports that he does not drink alcohol and does not use drugs.   Family History:  His family history includes Heart attack in his maternal aunt, maternal grandfather, maternal grandmother, and mother; Heart disease in his maternal grandfather and mother; Parkinson's disease in his father. There is no history of Prostate cancer, Bladder Cancer, or Kidney  cancer.   Allergies Allergies  Allergen Reactions  . Cyclobenzaprine Other (See Comments)    Not improving pain and makes him agressive.  . Lisinopril Cough     Home Medications  Prior to Admission medications   Medication Sig Start Date End Date Taking? Authorizing Provider  ibuprofen (ADVIL) 400 MG tablet Take 1 tablet (400 mg total) by mouth every 6 (six) hours as needed. 09/09/20  Yes Edison Simon R, PA-C  metoprolol tartrate (LOPRESSOR) 25 MG tablet Take 0.5 tablets (12.5 mg total) by mouth 2 (two) times daily. 09/19/20  Yes Karamalegos, Devonne Doughty, DO  oxyCODONE (OXY IR/ROXICODONE) 5 MG immediate release tablet Take 1 tablet (5 mg total) by mouth every 6 (six) hours as needed for severe pain or breakthrough pain. 09/09/20  Yes Edison Simon R, PA-C  pravastatin (PRAVACHOL) 20 MG tablet Take 1 tablet (20 mg total) by mouth daily. 05/30/20  Yes Karamalegos, Devonne Doughty, DO  zolpidem (AMBIEN) 5 MG tablet Take 1 tablet (5 mg total) by mouth at bedtime as needed for sleep. 08/19/20  Yes Karamalegos, Devonne Doughty, DO  acetaminophen (TYLENOL) 325 MG tablet Take 1 tablet (325 mg total) by mouth every 6 (six) hours as needed for mild pain, fever or headache (or Fever >/= 101). Patient not taking: No sig reported 07/12/20   Cherene Altes, MD  diphenhydramine-acetaminophen (TYLENOL PM) 25-500 MG TABS tablet Take 1 tablet by mouth at bedtime as needed. Patient not taking: No sig reported    [provider]  OZEMPIC, 0.25 OR 0.5 MG/DOSE, 2 MG/1.5ML SOPN Inject 1 mg into the skin once a week. Patient taking differently: Inject 1 mg into the skin every Thursday. 05/30/20   [name], DO     Critical Care Time: 40 minutes    Darel Hong, AGACNP-BC [facility] Prefer epic messenger for cross cover needs If after hours, please call E-link

## 2020-09-29 NOTE — Progress Notes (Addendum)
[facility] Hospital Day(s): 4.   Interval History:  Patient seen and examined, remains intubated and sedated Weaning from pressors; down to 4 mcg/min Norepinephrine  no acute events or new complaints overnight.  His leukocytosis continues to improve; WBC 27.0K Unfortunately, his renal function continues to worsen; sCr - 6.13; UO - 950 ccs. Nephrology following  Hyperphosphatemia consistent with renal failure His hyperbilirubinemia is improving; down to 2.2 Cx from drain placement growing enterococcus faecalis; continues on Zosyn NGT output minimal overnight   Vital signs in last 24 hours: [min-max] current  Temp:  [97.4 F (36.3 C)-98.1 F (36.7 C)] 98 F (36.7 C) (05/16 0400) Pulse Rate:  [52-65] 62 (05/16 0600) Resp:  [16-18] 16 (05/16 0600) BP: (82-125)/(55-81) 87/60 (05/16 0600) SpO2:  [93 %-100 %] 95 % (05/16 0600) FiO2 (%):  [30 %-40 %] 30 % (05/16 0159) Weight:  [119.3 kg] 119.3 kg (05/16 0500)     Height: 6\' 4"  (193 cm) Weight: 119.3 kg BMI (Calculated): 32.03   Intake/Output last 2 shifts:  05/15 0701 - 05/16 0700 In: 3931.8 [I.V.:3781.7; IV Piggyback:150] Out: 419 [Urine:950; Emesis/NG output:25]   Physical Exam:  Constitutional: Intubated, sedated HENT: normocephalic without obvious abnormality  Respiratory: On ventilator Cardiovascular: Bradycardic at 59 bpm, regular Gastrointestinal: Soft, unable to assess tenderness, non-distended, percutaneous drain in the RUQ with seropurulent output Genitourinary: Foley in place  Labs:  CBC Latest Ref Rng & Units 09/29/2020 09/28/2020 09/27/2020  WBC 4.0 - 10.5 K/uL 27.0(H) 36.9(H) 29.2(H)  Hemoglobin 13.0 - 17.0 g/dL 10.5(L) 11.5(L) 10.7(L)  Hematocrit 39.0 - 52.0 % 30.1(L) 33.4(L) 30.7(L)  Platelets 150 - 400 K/uL 475(H) 562(H) 465(H)   CMP Latest Ref Rng & Units 09/29/2020 09/28/2020 09/27/2020  Glucose 70 - 99 mg/dL 228(H) 230(H) 241(H)  BUN 8 - 23 mg/dL 86(H) 74(H) 74(H)   Creatinine 0.61 - 1.24 mg/dL 6.13(H) 5.42(H) 5.42(H)  Sodium 135 - 145 mmol/L 131(L) 134(L) 131(L)  Potassium 3.5 - 5.1 mmol/L 3.8 4.3 4.6  Chloride 98 - 111 mmol/L 87(L) 93(L) 95(L)  CO2 22 - 32 mmol/L 28 25 19(L)  Calcium 8.9 - 10.3 mg/dL 6.7(L) 7.1(L) 6.8(L)  Total Protein 6.5 - 8.1 g/dL 5.8(L) 6.3(L) 6.8  Total Bilirubin 0.3 - 1.2 mg/dL 2.2(H) 2.9(H) 3.9(H)  Alkaline Phos 38 - 126 U/L 213(H) 255(H) 279(H)  AST 15 - 41 U/L 55(H) 91(H) 100(H)  ALT 0 - 44 U/L 42 53(H) 48(H)     Imaging studies: No new pertinent imaging studies   Assessment/Plan:  65 y.o. critically-ill male requiring vasopressor and ventilator support with severe sepsis, AKI, and encephalopathy secondary to gallbladder fossa abscess 21 days s/p robotic assisted laparoscopic cholecystectomy for severe cholecystitis.    - Maintain percutaneous drain; monitor and record output   - Okay to use NGT for enteric feeding if needed  - Continue IV Abx (Zsoyn); Cx with enterococcus faecalis; sensitivities reviewed  - Appreciate nephrology assistance; plan for HD today  - Ventilator and vasopressor support per PCCM  - Monitor leukocytosis; improving             - Monitor renal function; worsening             - Monitor hyperbilirubinemia; improved; likely related to CBD compression   - Further management per primary service; we will follow    All of the above findings and recommendations were discussed with the medical team.   -- Edison Simon, PA-C [facility] 09/29/2020, 7:28 AM [phone]  M-F: 7am - 4pm   I saw and evaluated the patient.  I agree with the above documentation, exam, and plan, which I have edited where appropriate. [name]  11:13 AM

## 2020-09-29 NOTE — Progress Notes (Signed)
HD start @1043  w/ no complications. L CVC flushed and patent. Albumin started w/ HD tx @ 82ml/hr. Primary ICU RN present. Pt stable, sedated and on ventilator. HD orders being followed. No concerns at this time.

## 2020-09-29 NOTE — Consult Note (Addendum)
Consultation Note Date: 09/29/2020   Patient Name: Richard Oconnell  DOB: 01/23/1956  MRN: [id number]  Age / Sex: 65 y.o., male  PCP: [name], DO Referring Physician: Tyler Pita, MD  Reason for Consultation: Establishing goals of care  HPI/Patient Profile: 65 year old man past medical history of hypertension, diabetes, and bladder cancer who was hospitalized with acute cholecystitis-several months ago. He was temporized with a percutaneous cholecystostomy tube and later had a lap chole. Now returns for jaundice and confusion.   Clinical Assessment and Goals of Care: In to see patient, no family at bedside. He is resting with eyes closed.   Called to speak with sister and brother. Upon introduction they state they are hopeful; they state they are not ready to give up and have been advised to take one day at a time. They state they have been kept well updated by medical teams. They discuss in detail his status since 2018, with bladder cancer and oncologic intervention, a colonoscopy procedure, gall bladder drain for 56 days leading to surgery, and now another drain. They state he has been clear with them "he wants to try to live, but not a life of endless appointments or medical apparatuses". They state he has not discussed code status or ventilator support with them.    Will continue to follow.      SUMMARY OF RECOMMENDATIONS   Full code/full scope at this time.    Prognosis:   Poor overall     Primary Diagnoses: Present on Admission: **None**   I have reviewed the medical record, interviewed the patient and family, and examined the patient. The following aspects are pertinent.  Past Medical History:  Diagnosis Date  . Cancer [facility])    Bladder Cancer  . Erectile dysfunction associated with type 2 diabetes mellitus ([place]) 05/26/2016  . Gross hematuria 11/16/2016  .  Hypertension 06/11/2015  . Type 2 diabetes mellitus with diabetic polyneuropathy, without long-term current use of insulin ([place]) 06/11/2015   Social History   Socioeconomic History  . Marital status: Divorced    Spouse name: Not on file  . Number of children: Not on file  . Years of education: Not on file  . Highest education level: Not on file  Occupational History  . Not on file  Tobacco Use  . Smoking status: Never Smoker  . Smokeless tobacco: Never Used  . Tobacco comment: once a while had one cigar >10 years ago  Vaping Use  . Vaping Use: Never used  Substance and Sexual Activity  . Alcohol use: No    Alcohol/week: 0.0 standard drinks  . Drug use: No  . Sexual activity: Never  Other Topics Concern  . Not on file  Social History Narrative   Lives alone   Social Determinants of Health   Financial Resource Strain: Not on file  Food Insecurity: Not on file  Transportation Needs: Not on file  Physical Activity: Not on file  Stress: Not on file  Social Connections: Not on file  Family History  Problem Relation Age of Onset  . Heart attack Mother   . Heart disease Mother   . Parkinson's disease Father   . Heart attack Maternal Grandfather   . Heart disease Maternal Grandfather   . Heart attack Maternal Aunt   . Heart attack Maternal Grandmother   . Prostate cancer Neg Hx   . Bladder Cancer Neg Hx   . Kidney cancer Neg Hx    Scheduled Meds: . chlorhexidine gluconate (MEDLINE KIT)  15 mL Mouth Rinse BID  . Chlorhexidine Gluconate Cloth  6 each Topical Daily  . docusate  100 mg Per Tube BID  . feeding supplement (PROSource TF)  90 mL Per Tube QID  . feeding supplement (VITAL HIGH PROTEIN)  1,000 mL Per Tube Q24H  . free water  30 mL Per Tube Q4H  . heparin injection (subcutaneous)  5,000 Units Subcutaneous Q8H  . hydrocortisone sod succinate (SOLU-CORTEF) inj  50 mg Intravenous Q12H  . insulin aspart  0-20 Units Subcutaneous Q4H  . insulin glargine  10 Units  Subcutaneous QHS  . mouth rinse  15 mL Mouth Rinse 10 times per day  . midodrine  10 mg Per Tube TID WC  . multivitamin  1 tablet Per Tube QHS  . [START ON 09/30/2020] multivitamin  15 mL Per Tube Daily  . pantoprazole (PROTONIX) IV  40 mg Intravenous Q24H  . polyethylene glycol  17 g Per Tube Daily  . risperiDONE  1 mg Oral Once  . sodium chloride flush  3 mL Intravenous Q12H  . sodium chloride flush  5 mL Intracatheter Q8H  . zolpidem  5 mg Oral Once  . zolpidem  5 mg Per Tube QHS   Continuous Infusions: . sodium chloride Stopped (09/25/20 1647)  . sodium chloride    . sodium chloride    . fentaNYL infusion INTRAVENOUS 125 mcg/hr (09/29/20 1100)  . midazolam    . norepinephrine (LEVOPHED) Adult infusion 4 mcg/min (09/29/20 1100)  . piperacillin-tazobactam (ZOSYN)  IV 2.25 g (09/29/20 1337)  . propofol (DIPRIVAN) infusion 30 mcg/kg/min (09/29/20 1208)   PRN Meds:.sodium chloride, acetaminophen, albuterol, docusate, fentaNYL (SUBLIMAZE) injection, haloperidol lactate, midazolam, ondansetron (ZOFRAN) IV, polyethylene glycol, sodium chloride flush Medications Prior to Admission:  Prior to Admission medications   Medication Sig Start Date End Date Taking? Authorizing Provider  ibuprofen (ADVIL) 400 MG tablet Take 1 tablet (400 mg total) by mouth every 6 (six) hours as needed. 09/09/20  Yes Edison Simon R, PA-C  metoprolol tartrate (LOPRESSOR) 25 MG tablet Take 0.5 tablets (12.5 mg total) by mouth 2 (two) times daily. 09/19/20  Yes Karamalegos, Devonne Doughty, DO  oxyCODONE (OXY IR/ROXICODONE) 5 MG immediate release tablet Take 1 tablet (5 mg total) by mouth every 6 (six) hours as needed for severe pain or breakthrough pain. 09/09/20  Yes Edison Simon R, PA-C  pravastatin (PRAVACHOL) 20 MG tablet Take 1 tablet (20 mg total) by mouth daily. 05/30/20  Yes Karamalegos, Devonne Doughty, DO  zolpidem (AMBIEN) 5 MG tablet Take 1 tablet (5 mg total) by mouth at bedtime as needed for sleep. 08/19/20  Yes  Karamalegos, Devonne Doughty, DO  acetaminophen (TYLENOL) 325 MG tablet Take 1 tablet (325 mg total) by mouth every 6 (six) hours as needed for mild pain, fever or headache (or Fever >/= 101). Patient not taking: No sig reported 07/12/20   Cherene Altes, MD  diphenhydramine-acetaminophen (TYLENOL PM) 25-500 MG TABS tablet Take 1 tablet by mouth at bedtime as needed.  Patient not taking: No sig reported    [provider]  OZEMPIC, 0.25 OR 0.5 MG/DOSE, 2 MG/1.5ML SOPN Inject 1 mg into the skin once a week. Patient taking differently: Inject 1 mg into the skin every Thursday. 05/30/20   [name], DO   Allergies  Allergen Reactions  . Cyclobenzaprine Other (See Comments)    Not improving pain and makes him agressive.  . Lisinopril Cough   Review of Systems  Unable to perform ROS   Physical Exam Pulmonary:     Effort: Pulmonary effort is normal.  Neurological:     Mental Status: He is alert.     Vital Signs: BP 117/73 (BP Location: Right Arm)   Pulse (!) 53   Temp 97.7 F (36.5 C) (Axillary)   Resp 16   Ht _0  (1.93 m)   Wt 119.3 kg   SpO2 96%   BMI 32.01 kg/m  Pain Scale: CPOT   Pain Score: 0-No pain   SpO2: SpO2: 96 % O2 Device:SpO2: 96 % O2 Flow Rate: .O2 Flow Rate (L/min): 2 L/min  IO: Intake/output summary:   Intake/Output Summary (Last 24 hours) at 09/29/2020 1339 Last data filed at 09/29/2020 1240 Gross per 24 hour  Intake 3788.6 ml  Output 879 ml  Net 2909.6 ml    LBM: Last BM Date: 09/26/20 Baseline Weight: Weight: 113 kg Most recent weight: Weight: 119.3 kg       Time In: 1:10 Time Out: 2:00 Time Total: 50 min Greater than 50%  of this time was spent counseling and coordinating care related to the above assessment and plan.  Signed by: Asencion Gowda, NP   Please contact Palliative Medicine Team phone at [phone] for questions and concerns.  For individual provider: See Shea Evans

## 2020-09-29 NOTE — Progress Notes (Signed)
Initial Nutrition Assessment  DOCUMENTATION CODES:   Obesity unspecified  INTERVENTION:   Vital HP _0 /hr- Initiate at 33m/hr and increase by 172mhr q 12 hours until goal rate is reached.   Pro-Source 9078mID via tube, provides 40kcal and 11g of protein per serving   Propofol: 19.36m73m- provides 508kcal/day   Free water flushes 30ml51mhours to maintain tube patency   Regimen provides 1788kcal/day, 172g/day protein and 982ml/63mfree water   Pt at high refeed risk; recommend monitor potassium, magnesium and phosphorus labs daily until stable  Rena-vit daily via tube   Liquid MVI daily via tube   NUTRITION DIAGNOSIS:   Inadequate oral intake related to inability to eat (pt sedated and ventilated) as evidenced by NPO status.  GOAL:   Provide needs based on ASPEN/SCCM guidelines  MONITOR:   Labs,Weight trends,Vent status,TF tolerance,Skin,I & O's  REASON FOR ASSESSMENT:   Ventilator    ASSESSMENT:   65 y/o43ale with h/o bladder cancer, DM, HTN and cholecystectomy 4/25 who is admitted with intrabdomial abscess, septic shock, ARF and bacteremia.   Pt s/p IR drain placement 5/12 Pt s/p intubation and HD initiation 5/14  Pt sedated and ventilated. OGT in place. Plan is to initiate tube feeds today. Pt is at high refeed risk. Pt with hyperphosphatemia today; will inquire about binders. Drains in place with minimal output. Pt receiving HD today. Plan to start albumin. Palliative care consult pending.   Medications reviewed and include: colace, heparin, solu-cortef, insulin, protonix, miralax, fentanyl, levophed, zosyn, propofol   Labs reviewed: Na 131(L), K 3.8 wnl, BUN 86(H), creat 6.13(H), P 8.6(H), Mg 2.5(H), alk phos 213(H), AST 55(H), tbili 2.2(H) Wbc- 27.0(H), Hgb 10.5(L), Hct 30.1(L) cbgs- 218, 197, 170 x 24 hrs AIC 6.7(H)- 5/13  Patient is currently intubated on ventilator support MV: 7.7 L/min Temp (24hrs), Avg:97.8 F (36.6 C), Min:97.4 F (36.3  C), Max:98.1 F (36.7 C)  Propofol: 19.36ml/h29mrovides 508kcal/day   MAP- >65mmHg 25mP- 950ml   N55mTION - FOCUSED PHYSICAL EXAM:  Flowsheet Row Most Recent Value  Orbital Region No depletion  Upper Arm Region No depletion  Thoracic and Lumbar Region No depletion  Buccal Region No depletion  Temple Region Mild depletion  Clavicle Bone Region No depletion  Clavicle and Acromion Bone Region No depletion  Scapular Bone Region No depletion  Dorsal Hand No depletion  Patellar Region No depletion  Anterior Thigh Region No depletion  Posterior Calf Region No depletion  Edema (RD Assessment) Mild  Hair Reviewed  Eyes Reviewed  Mouth Reviewed  Skin Reviewed  Nails Reviewed     Diet Order:   Diet Order            Diet NPO time specified  Diet effective now                EDUCATION NEEDS:   No education needs have been identified at this time  Skin:  Skin Assessment: Reviewed RN Assessment (ecchymosis)  Last BM:  5/13  Height:   Ht Readings from Last 1 Encounters:  09/28/20 _1  (1.93 m)    Weight:   Wt Readings from Last 1 Encounters:  09/29/20 119.3 kg    Ideal Body Weight:  91.8 kg  BMI:  Body mass index is 32.01 kg/m.  Estimated Nutritional Needs:   Kcal:  1312-1666kcal/day  Protein:  184g/day  Fluid:  UOP +1L  Zebulen Simonis CamKoleen DistanceLDN Please refer to [facility]/or [facility] on-call/weekend/after hours pager

## 2020-09-30 ENCOUNTER — Inpatient Hospital Stay: Payer: Medicare Other

## 2020-09-30 DIAGNOSIS — J9601 Acute respiratory failure with hypoxia: Secondary | ICD-10-CM | POA: Diagnosis not present

## 2020-09-30 DIAGNOSIS — A4181 Sepsis due to Enterococcus: Secondary | ICD-10-CM | POA: Diagnosis not present

## 2020-09-30 DIAGNOSIS — Z7189 Other specified counseling: Secondary | ICD-10-CM | POA: Diagnosis not present

## 2020-09-30 DIAGNOSIS — K750 Abscess of liver: Secondary | ICD-10-CM | POA: Diagnosis not present

## 2020-09-30 DIAGNOSIS — R6520 Severe sepsis without septic shock: Secondary | ICD-10-CM | POA: Diagnosis not present

## 2020-09-30 DIAGNOSIS — Z515 Encounter for palliative care: Secondary | ICD-10-CM | POA: Diagnosis not present

## 2020-09-30 DIAGNOSIS — B9689 Other specified bacterial agents as the cause of diseases classified elsewhere: Secondary | ICD-10-CM | POA: Diagnosis not present

## 2020-09-30 DIAGNOSIS — A4189 Other specified sepsis: Secondary | ICD-10-CM | POA: Diagnosis not present

## 2020-09-30 DIAGNOSIS — A419 Sepsis, unspecified organism: Secondary | ICD-10-CM | POA: Diagnosis not present

## 2020-09-30 DIAGNOSIS — R6521 Severe sepsis with septic shock: Secondary | ICD-10-CM | POA: Diagnosis not present

## 2020-09-30 LAB — CBC
HCT: 28.7 % — ABNORMAL LOW (ref 39.0–52.0)
Hemoglobin: 9.6 g/dL — ABNORMAL LOW (ref 13.0–17.0)
MCH: 26.6 pg (ref 26.0–34.0)
MCHC: 33.4 g/dL (ref 30.0–36.0)
MCV: 79.5 fL — ABNORMAL LOW (ref 80.0–100.0)
Platelets: 373 10*3/uL (ref 150–400)
RBC: 3.61 MIL/uL — ABNORMAL LOW (ref 4.22–5.81)
RDW: 15.6 % — ABNORMAL HIGH (ref 11.5–15.5)
WBC: 19.3 10*3/uL — ABNORMAL HIGH (ref 4.0–10.5)
nRBC: 0.1 % (ref 0.0–0.2)

## 2020-09-30 LAB — RENAL FUNCTION PANEL
Albumin: 1.9 g/dL — ABNORMAL LOW (ref 3.5–5.0)
Anion gap: 12 (ref 5–15)
BUN: 81 mg/dL — ABNORMAL HIGH (ref 8–23)
CO2: 29 mmol/L (ref 22–32)
Calcium: 7 mg/dL — ABNORMAL LOW (ref 8.9–10.3)
Chloride: 94 mmol/L — ABNORMAL LOW (ref 98–111)
Creatinine, Ser: 5.49 mg/dL — ABNORMAL HIGH (ref 0.61–1.24)
GFR, Estimated: 11 mL/min — ABNORMAL LOW (ref >60)
Glucose, Bld: 148 mg/dL — ABNORMAL HIGH (ref 70–99)
Phosphorus: 6.8 mg/dL — ABNORMAL HIGH (ref 2.5–4.6)
Potassium: 3.5 mmol/L (ref 3.5–5.1)
Sodium: 135 mmol/L (ref 135–145)

## 2020-09-30 LAB — CULTURE, BLOOD (ROUTINE X 2)
Culture: NO GROWTH
Special Requests: ADEQUATE

## 2020-09-30 LAB — GLUCOSE, CAPILLARY
Glucose-Capillary: 157 mg/dL — ABNORMAL HIGH (ref 70–99)
Glucose-Capillary: 174 mg/dL — ABNORMAL HIGH (ref 70–99)
Glucose-Capillary: 214 mg/dL — ABNORMAL HIGH (ref 70–99)
Glucose-Capillary: 171 mg/dL — ABNORMAL HIGH (ref 70–99)
Glucose-Capillary: 135 mg/dL — ABNORMAL HIGH (ref 70–99)

## 2020-09-30 LAB — PROTEIN ELECTRO, RANDOM URINE
Albumin ELP, Urine: 29.1 %
Alpha-1-Globulin, U: 3.4 %
Alpha-2-Globulin, U: 6.4 %
Beta Globulin, U: 19.2 %
Gamma Globulin, U: 41.9 %
Total Protein, Urine: 98.1 mg/dL

## 2020-09-30 LAB — MAGNESIUM: Magnesium: 2.4 mg/dL (ref 1.7–2.4)

## 2020-09-30 LAB — AMMONIA: Ammonia: 20 umol/L (ref 9–35)

## 2020-09-30 MED ORDER — VITAL 1.5 CAL PO LIQD
1000.0000 mL | ORAL | Status: DC
  Administered 2020-09-30 – 2020-10-07 (×7): 1000 mL

## 2020-09-30 MED ORDER — PROSOURCE TF PO LIQD
45.0000 mL | Freq: Every day | ORAL | Status: DC
  Administered 2020-10-01 – 2020-10-07 (×6): 45 mL
  Filled 2020-09-30: qty 45

## 2020-09-30 MED ORDER — ALBUMIN HUMAN 25 % IV SOLN
25.0000 g | Freq: Once | INTRAVENOUS | Status: AC
  Administered 2020-09-30: 25 g via INTRAVENOUS
  Filled 2020-09-30: qty 100

## 2020-09-30 NOTE — Progress Notes (Addendum)
[facility] Day(s): 5.   Interval History:  Patient seen and examined; remains intubated; off sedation able to open eyes and follow commands Now off pressors No acute events or new complaints overnight.  Leukocytosis continues to make marked improvements; now down to 19.3K Hgb trending down; now 9.6; suspect this is dilutional Renal function mildly improved; sCr - 5.49; UO - 975 ccs No significant electrolyte derangements  Cx from drain placement growing enterococcus faecalis; continues on Zosyn Started on tube feedings yesterday (05/16); tolerating   Vital signs in last 24 hours: [min-max] current  Temp:  [97.7 F (36.5 C)-98.5 F (36.9 C)] 98.4 F (36.9 C) (05/17 0400) Pulse Rate:  [51-64] 62 (05/17 0700) Resp:  [12-17] 17 (05/17 0700) BP: (87-132)/(58-87) 113/71 (05/17 0715) SpO2:  [94 %-100 %] 96 % (05/17 0700) FiO2 (%):  [28 %-30 %] 28 % (05/17 0349) Weight:  [118.3 kg] 118.3 kg (05/17 0500)     Height: 6' 3.98" (193 cm) Weight: 118.3 kg BMI (Calculated): 31.76   Intake/Output last 2 shifts:  05/16 0701 - 05/17 0700 In: 2240.3 [I.V.:1176; NG/GT:370; IV Piggyback:694.3] Out: 904 [Urine:975; Emesis/NG output:25]   Physical Exam:  Constitutional: Intubated, opens eyes, follows basic commands HENT: normocephalic without obvious abnormality  Respiratory: On ventilator Cardiovascular: HRRR Gastrointestinal: Soft, unable to assess tenderness, non-distended, percutaneous drain in the RUQ with seropurulent output Genitourinary: Foley in place  Labs:  CBC Latest Ref Rng & Units 09/30/2020 09/29/2020 09/28/2020  WBC 4.0 - 10.5 K/uL 19.3(H) 27.0(H) 36.9(H)  Hemoglobin 13.0 - 17.0 g/dL 9.6(L) 10.5(L) 11.5(L)  Hematocrit 39.0 - 52.0 % 28.7(L) 30.1(L) 33.4(L)  Platelets 150 - 400 K/uL 373 475(H) 562(H)   CMP Latest Ref Rng & Units 09/30/2020 09/29/2020 09/28/2020  Glucose 70 - 99 mg/dL 148(H) 228(H) 230(H)  BUN 8 - 23 mg/dL 81(H)  86(H) 74(H)  Creatinine 0.61 - 1.24 mg/dL 5.49(H) 6.13(H) 5.42(H)  Sodium 135 - 145 mmol/L 135 131(L) 134(L)  Potassium 3.5 - 5.1 mmol/L 3.5 3.8 4.3  Chloride 98 - 111 mmol/L 94(L) 87(L) 93(L)  CO2 22 - 32 mmol/L 29 28 25   Calcium 8.9 - 10.3 mg/dL 7.0(L) 6.7(L) 7.1(L)  Total Protein 6.5 - 8.1 g/dL - 5.8(L) 6.3(L)  Total Bilirubin 0.3 - 1.2 mg/dL - 2.2(H) 2.9(H)  Alkaline Phos 38 - 126 U/L - 213(H) 255(H)  AST 15 - 41 U/L - 55(H) 91(H)  ALT 0 - 44 U/L - 42 53(H)     Imaging studies: No new pertinent imaging studies   Assessment/Plan:  65 y.o. critically-ill male requiring ventilator support with severe sepsis, AKI, and encephalopathy secondary to gallbladder fossa abscess 21 days s/p robotic assisted laparoscopic cholecystectomy for severe cholecystitis.   - Maintain percutaneous drain; monitor and record output              - Okay to continue use of NGT for enteric feeding if needed             - Continue IV Abx (Zsoyn); Cx with enterococcus faecalis; sensitivities reviewed             - Appreciate nephrology assistance             - Ventilator and vasopressor support per PCCM             - Monitor leukocytosis; improving             - Monitor renal function; improved this morning             -  Further management per primary service; we will follow     All of the above findings and recommendations were discussed with the medical team  -- Edison Simon, PA-C [facility] 09/30/2020, 7:28 AM [phone] M-F: 7am - 4pm  Patient had CT scan a few minutes ago; results pending. May need additional drainage. Per bedside RN, plan for wake up and SBT trial this afternoon. I saw and evaluated the patient.  I agree with the above documentation, exam, and plan, which I have edited where appropriate. [name]  12:27 PM

## 2020-09-30 NOTE — Progress Notes (Signed)
[facility] INFECTIOUS DISE[facility] PROGRESS NOTE Date of Admission:  09/25/2020      ID: Richard Oconnell is a 65 y.o. male with sepsos, intrabd abscess Active Problems:   Sepsis with acute renal failure and septic shock ([place])   Altered mental status   Abscess   Acute kidney failure, unspecified ([place])   Encounter for nasogastric (NG) tube placement   Acute respiratory failure ([place])   Subjective: No fevers, wbc down some. JP with less drainage. Off pressors, more awake. CT done  ROS  Unable to obtain  Medications:  Antibiotics Given (last 72 hours)    Date/Time Action Medication Dose Rate   09/27/20 1711 New Bag/Given   piperacillin-tazobactam (ZOSYN) 2.25 g in sodium chloride 0.9 % 50 mL IVPB 2.25 g 100 mL/hr   09/28/20 0017 New Bag/Given   piperacillin-tazobactam (ZOSYN) 2.25 g in sodium chloride 0.9 % 50 mL IVPB 2.25 g 100 mL/hr   09/28/20 0522 New Bag/Given   piperacillin-tazobactam (ZOSYN) 2.25 g in sodium chloride 0.9 % 50 mL IVPB 2.25 g 100 mL/hr   09/28/20 1747 New Bag/Given   piperacillin-tazobactam (ZOSYN) IVPB 2.25 g 2.25 g 100 mL/hr   09/28/20 2310 New Bag/Given   piperacillin-tazobactam (ZOSYN) IVPB 2.25 g 2.25 g 100 mL/hr   09/29/20 0510 New Bag/Given   piperacillin-tazobactam (ZOSYN) IVPB 2.25 g 2.25 g 100 mL/hr   09/29/20 1337 New Bag/Given   piperacillin-tazobactam (ZOSYN) IVPB 2.25 g 2.25 g 100 mL/hr   09/29/20 1737 New Bag/Given   piperacillin-tazobactam (ZOSYN) IVPB 2.25 g 2.25 g 100 mL/hr   09/29/20 2336 New Bag/Given   piperacillin-tazobactam (ZOSYN) IVPB 2.25 g 2.25 g 100 mL/hr   09/30/20 0516 New Bag/Given   piperacillin-tazobactam (ZOSYN) IVPB 2.25 g 2.25 g 100 mL/hr   09/30/20 1129 New Bag/Given   piperacillin-tazobactam (ZOSYN) IVPB 2.25 g 2.25 g 100 mL/hr     . chlorhexidine gluconate (MEDLINE KIT)  15 mL Mouth Rinse BID  . Chlorhexidine Gluconate Cloth  6 each Topical Daily  . docusate  100 mg Per Tube BID  . feeding supplement  (PROSource TF)  90 mL Per Tube QID  . feeding supplement (VITAL HIGH PROTEIN)  1,000 mL Per Tube Q24H  . free water  30 mL Per Tube Q4H  . heparin injection (subcutaneous)  5,000 Units Subcutaneous Q8H  . hydrocortisone sod succinate (SOLU-CORTEF) inj  50 mg Intravenous Q12H  . insulin aspart  0-20 Units Subcutaneous Q4H  . insulin glargine  10 Units Subcutaneous QHS  . mouth rinse  15 mL Mouth Rinse 10 times per day  . midodrine  10 mg Per Tube TID WC  . multivitamin  1 tablet Per Tube QHS  . multivitamin  15 mL Per Tube Daily  . pantoprazole (PROTONIX) IV  40 mg Intravenous Q24H  . polyethylene glycol  17 g Per Tube Daily  . sodium chloride flush  3 mL Intravenous Q12H  . sodium chloride flush  5 mL Intracatheter Q8H    Objective: Vital signs in last 24 hours: Temp:  [97.9 F (36.6 C)-98.5 F (36.9 C)] 98.3 F (36.8 C) (05/17 0800) Pulse Rate:  [51-78] 78 (05/17 1130) Resp:  [12-22] 16 (05/17 1130) BP: (87-132)/(58-87) 103/60 (05/17 1130) SpO2:  [94 %-100 %] 99 % (05/17 1130) FiO2 (%):  [28 %-30 %] 28 % (05/17 1123) Weight:  [118.3 kg] 118.3 kg (05/17 0500) Constitutional:obese sedated HENT:anicteric Mouth/Throat: ETT in place Cardiovascular: Normal rate, regular rhythm and normal heart sounds. Pulmonary/Chest: Effort normal and  breath sounds normal. No respiratory distress. He has no wheezes.  HD cath in palce Abdominal: Soft.JP drain RUQ with mindrainage Lymphadenopathy: He has no cervical adenopathy.  Neurological:sedatedSkin: Skin is warm and dry. No rash noted. No erythema.  Lab Results Recent Labs    09/29/20 0456 09/30/20 0514  WBC 27.0* 19.3*  HGB 10.5* 9.6*  HCT 30.1* 28.7*  NA 131* 135  K 3.8 3.5  CL 87* 94*  CO2 28 29  BUN 86* 81*  CREATININE 6.13* 5.49*    Microbiology: Results for orders placed or performed during the hospital encounter of 09/25/20  Culture, blood (routine x 2)     Status: Abnormal   Collection Time: 09/25/20  2:41 PM    Specimen: BLOOD  Result Value Ref Range Status   Specimen Description   Final    BLOOD RIGHT ANTECUBITAL Performed at [facility], [address], [place], [place] [address]    Special Requests   Final    BOTTLES DRAWN AEROBIC AND ANAEROBIC Blood Culture results may not be optimal due to an excessive volume of blood received in culture bottles Performed at [facility], [address] [place], [place] [address]    Culture  Setup Time   Final    Organism ID to follow [place] CRITICAL RESULT CALLED TO, READ BACK BY AND VERIFIED WITH: LISA KLUTTZ @0802  ON 09/26/20.[place] Performed at [facility], [address] [place], [place] [address]    Culture ENTEROCOCCUS FAECALIS (A)  Final   Report Status 09/28/2020 FINAL  Final   Organism ID, Bacteria ENTEROCOCCUS FAECALIS  Final      Susceptibility   Enterococcus faecalis - MIC*    AMPICILLIN <=2 SENSITIVE Sensitive     VANCOMYCIN 1 SENSITIVE Sensitive     GENTAMICIN SYNERGY SENSITIVE Sensitive     * ENTEROCOCCUS FAECALIS  Blood Culture ID Panel (Reflexed)     Status: Abnormal   Collection Time: 09/25/20  2:41 PM  Result Value Ref Range Status   Enterococcus faecalis DETECTED (A) NOT DETECTED Final    Comment: CRITICAL RESULT CALLED TO, READ BACK BY AND VERIFIED WITH: LISA KLUTTZ @0802  ON 09/26/20.SH    Enterococcus Faecium NOT DETECTED NOT DETECTED Final   Listeria monocytogenes NOT DETECTED NOT DETECTED Final   Staphylococcus species NOT DETECTED NOT DETECTED Final   Staphylococcus aureus (BCID) NOT DETECTED NOT DETECTED Final   Staphylococcus epidermidis NOT DETECTED NOT DETECTED Final   Staphylococcus lugdunensis NOT DETECTED NOT DETECTED Final   Streptococcus species NOT DETECTED NOT DETECTED Final   Streptococcus agalactiae NOT DETECTED NOT DETECTED Final   Streptococcus pneumoniae NOT DETECTED NOT DETECTED Final   Streptococcus pyogenes NOT  DETECTED NOT DETECTED Final   A.calcoaceticus-baumannii NOT DETECTED NOT DETECTED Final   Bacteroides fragilis DETECTED (A) NOT DETECTED Final    Comment: CRITICAL RESULT CALLED TO, READ BACK BY AND VERIFIED WITH: LISA KLUTTZ @0802  ON 09/26/20.SH    Enterobacterales NOT DETECTED NOT DETECTED Final   Enterobacter cloacae complex NOT DETECTED NOT DETECTED Final   Escherichia coli NOT DETECTED NOT DETECTED Final   Klebsiella aerogenes NOT DETECTED NOT DETECTED Final   Klebsiella oxytoca NOT DETECTED NOT DETECTED Final   Klebsiella pneumoniae NOT DETECTED NOT DETECTED Final   Proteus species NOT DETECTED NOT DETECTED Final   Salmonella species NOT DETECTED NOT DETECTED Final   Serratia marcescens NOT DETECTED NOT DETECTED Final   Haemophilus influenzae NOT DETECTED NOT DETECTED Final  Neisseria meningitidis NOT DETECTED NOT DETECTED Final   Pseudomonas aeruginosa NOT DETECTED NOT DETECTED Final   Stenotrophomonas maltophilia NOT DETECTED NOT DETECTED Final   Candida albicans NOT DETECTED NOT DETECTED Final   Candida auris NOT DETECTED NOT DETECTED Final   Candida glabrata NOT DETECTED NOT DETECTED Final   Candida krusei NOT DETECTED NOT DETECTED Final   Candida parapsilosis NOT DETECTED NOT DETECTED Final   Candida tropicalis NOT DETECTED NOT DETECTED Final   Cryptococcus neoformans/gattii NOT DETECTED NOT DETECTED Final   Vancomycin resistance NOT DETECTED NOT DETECTED Final    Comment: Performed at [facility], [address] [place], [place] [address]  Culture, blood (routine x 2)     Status: None (Preliminary result)   Collection Time: 09/25/20  3:42 PM   Specimen: BLOOD  Result Value Ref Range Status   Specimen Description BLOOD BLOOD LEFT HAND  Final   Special Requests   Final    BOTTLES DRAWN AEROBIC AND ANAEROBIC Blood Culture adequate volume   Culture   Final    NO GROWTH 4 DAYS Performed at [facility], [address], [place], [place] [address]     Report Status PENDING  Incomplete  Resp Panel by RT-PCR (Flu A&B, Covid) Nasopharyngeal Swab     Status: None   Collection Time: 09/25/20  3:51 PM   Specimen: Nasopharyngeal Swab; Nasopharyngeal(NP) swabs in vial transport medium  Result Value Ref Range Status   SARS Coronavirus 2 by RT PCR NEGATIVE NEGATIVE Final    Comment: (NOTE) SARS-CoV-2 target nucleic acids are NOT DETECTED.  The SARS-CoV-2 RNA is generally detectable in upper respiratory specimens during the acute phase of infection. The lowest concentration of SARS-CoV-2 viral copies this assay can detect is 138 copies/mL. A negative result does not preclude SARS-Cov-2 infection and should not be used as the sole basis for treatment or other patient management decisions. A negative result may occur with  improper specimen collection/handling, submission of specimen other than nasopharyngeal swab, presence of viral mutation(s) within the areas targeted by this assay, and inadequate number of viral copies(<138 copies/mL). A negative result must be combined with clinical observations, patient history, and epidemiological information. The expected result is Negative.  Fact Sheet for Patients:  EntrepreneurPulse.com.au  Fact Sheet for Healthcare Providers:  IncredibleEmployment.be  This test is no t yet approved or cleared by the [place] FDA and  has been authorized for detection and/or diagnosis of SARS-CoV-2 by FDA under an Emergency Use Authorization (EUA). This EUA will remain  in effect (meaning this test can be used) for the duration of the COVID-19 declaration under Section 564(b)(1) of the Act, 21 U.S.C.section 360bbb-3(b)(1), unless the authorization is terminated  or revoked sooner.       Influenza A by PCR NEGATIVE NEGATIVE Final   Influenza B by PCR NEGATIVE NEGATIVE Final    Comment: (NOTE) The Xpert Xpress SARS-CoV-2/FLU/RSV plus assay is intended as an aid in the  diagnosis of influenza from Nasopharyngeal swab specimens and should not be used as a sole basis for treatment. Nasal washings and aspirates are unacceptable for Xpert Xpress SARS-CoV-2/FLU/RSV testing.  Fact Sheet for Patients: EntrepreneurPulse.com.au  Fact Sheet for Healthcare Providers: IncredibleEmployment.be  This test is not yet approved or cleared by the [place] FDA and has been authorized for detection and/or diagnosis of SARS-CoV-2 by FDA under an Emergency Use Authorization (EUA). This EUA will remain in effect (meaning this test can be used) for the duration of the  COVID-19 declaration under Section 564(b)(1) of the Act, 21 U.S.C. section 360bbb-3(b)(1), unless the authorization is terminated or revoked.  Performed at [facility], [address] [place], [place] [address]   MRSA PCR Screening     Status: None   Collection Time: 09/25/20  6:44 PM   Specimen: Nasopharyngeal  Result Value Ref Range Status   MRSA by PCR NEGATIVE NEGATIVE Final    Comment:        The GeneXpert MRSA Assay (FDA approved for NASAL specimens only), is one component of a comprehensive MRSA colonization surveillance program. It is not intended to diagnose MRSA infection nor to guide or monitor treatment for MRSA infections. Performed at [facility], [address] [place], [place] [address]   Aerobic/Anaerobic Culture (surgical/deep wound)     Status: None   Collection Time: 09/25/20  9:02 PM   Specimen: Abscess  Result Value Ref Range Status   Specimen Description   Final    ABSCESS Performed at [facility], [address] [place], [place] [address]    Special Requests   Final    Normal Performed at [place], [place] [address]    Gram Stain   Final    ABUNDANT WBC PRESENT,BOTH PMN AND MONONUCLEAR MODERATE GRAM NEGATIVE COCCOBACILLI    Culture   Final    ABUNDANT  ENTEROCOCCUS FAECALIS MODERATE BACTEROIDES FRAGILIS BETA LACTAMASE POSITIVE Performed at [facility], [place] [address] [place], [place] [address]    Report Status 09/28/2020 FINAL  Final   Organism ID, Bacteria ENTEROCOCCUS FAECALIS  Final      Susceptibility   Enterococcus faecalis - MIC*    AMPICILLIN <=2 SENSITIVE Sensitive     VANCOMYCIN 1 SENSITIVE Sensitive     GENTAMICIN SYNERGY SENSITIVE Sensitive     * ABUNDANT ENTEROCOCCUS FAECALIS  Urine culture     Status: None   Collection Time: 09/26/20  1:36 PM   Specimen: Urine, Random  Result Value Ref Range Status   Specimen Description   Final    URINE, RANDOM Performed at [facility], [address] [place], [place] [address]    Special Requests   Final    NONE Performed at [facility], [address], [place], [place] [address]    Culture   Final    NO GROWTH Performed at [facility], [place] [address] [place],  [address]    Report Status 09/28/2020 FINAL  Final    Studies/Results: No results found.  Assessment/Plan: Richard Dubs Lindleyis a 65 y.o.maleith a history of type 2 diabetes, hypertension, hyperlipidemia, has had a complicated course since February 2022 when he was admitted with acute cholecystitis. He was treated with initially IV antibiotics and then transition to oral antibiotics. He was high risk for surgery so IR performed a percutaneous cholecystotomy on February 24. He followed up with general surgery and had lap chole 4/25.DC'd with a drain in place as well as on Augmentin for 7 days. Drain was removed May 3. He had not really been able to take good care of it at home. He now presents with sepsis, chills, abdominal pain and found to have a very large intra-abdominal abscess. He remained septic in the ICU on pressors. Cultures of blood and abscess are growing Enterococcus. 5/16 - wbc still elevated but decreasing. TTE negative 5/17 off pressors but still  intubated.  CT shows continued abscess  Recommendations Continue Zosyn Cont  fluconazole. May need upsize of IR drainage cath or flushing. Defer to surgery  May consider TEE    Thank you very much for the consult. Will follow with you.  Leonel Ramsay   09/30/2020, 1:36 PM

## 2020-09-30 NOTE — Progress Notes (Signed)
Nutrition Follow Up Note   DOCUMENTATION CODES:   Obesity unspecified  INTERVENTION:   Once appropriate to start tube feeds, recommend:  Vital 1.5 _0 /hr- Initiate at 78m/hr and increase by 171mhr q 8 hours until goal rate is reached.   Pro-Source 3046maily via tube, provides 40kcal and 11g of protein per serving   Free water flushes 11m68m hours to maintain tube patency   Regimen provides 2740kcal/day, 133g/day protein and 1555ml44m free water   Pt at high refeed risk; recommend monitor potassium, magnesium and phosphorus labs daily until stable  Rena-vit daily via tube   NUTRITION DIAGNOSIS:   Inadequate oral intake related to inability to eat (pt sedated and ventilated) as evidenced by NPO status.  GOAL:   Patient will meet greater than or equal to 90% of their needs  -not met   MONITOR:   Labs,Weight trends,TF tolerance,Skin,I & O's  ASSESSMENT:   65 y/69male with h/o bladder cancer, DM, HTN and cholecystectomy 4/25 who is admitted with intrabdomial abscess, septic shock, ARF and bacteremia.   Pt s/p IR drain placement 5/12 Pt s/p intubation and HD initiation 5/14  Pt extubated this evening. Pt remains lethargic. NGT remains in place. Plan will be to resume tube feeds later tonight once pt is more awake.   Medications reviewed and include: colace, heparin, solu-cortef, insulin, rena-vit, MVI, protonix,  fentanyl, fluconazole, levophed, zosyn  Labs reviewed: K 3.5 wnl, BUN 81(H), creat 5.49(H), P 6.8(H), Mg 2.4 wnl, alb 1.9(L) Wbc- 19.3(H), Hgb 9.6(L), Hct 28.7(L) cbgs- 157, 174, 214, 171 x 24 hrs  Diet Order:    Diet Order            Diet NPO time specified  Diet effective now                EDUCATION NEEDS:   No education needs have been identified at this time  Skin:  Skin Assessment: Reviewed RN Assessment (ecchymosis)  Last BM:  5/13  Height:   Ht Readings from Last 1 Encounters:  09/30/20 6' 3.98" (1.93 m)    Weight:   Wt  Readings from Last 1 Encounters:  09/30/20 118.3 kg    Ideal Body Weight:  91.8 kg  BMI:  Body mass index is 31.76 kg/m.  Estimated Nutritional Needs:   Kcal:  2700-3000kcal/day  Protein:  >130g/day  Fluid:  UOP +1L  CaseyKoleen DistanceRD, LDN Please refer to [facility] and/or RD on-call/weekend/after hours pager

## 2020-09-30 NOTE — Progress Notes (Signed)
[place], [place] 09/30/20  Subjective:   Hospital day # 5  Patient is critically ill  Renal: 05/16 0701 - 05/17 0700 In: 2240.3 [I.V.:1176; NG/GT:370; IV Piggyback:694.3] Out: 904 [Urine:975; Emesis/NG output:25] Lab Results  Component Value Date   CREATININE 5.49 (H) 09/30/2020   CREATININE 6.13 (H) 09/29/2020   CREATININE 5.42 (H) 09/28/2020    no acute events Remains sedated with propofol and fentanyl Levophed is off Fio2 30%  Objective:  Vital signs in last 24 hours:  Temp:  [97.7 F (36.5 C)-98.5 F (36.9 C)] 98.3 F (36.8 C) (05/17 0800) Pulse Rate:  [51-68] 68 (05/17 0800) Resp:  [12-17] 17 (05/17 0800) BP: (87-132)/(58-87) 112/64 (05/17 0800) SpO2:  [94 %-100 %] 100 % (05/17 0822) FiO2 (%):  [28 %-30 %] 28 % (05/17 0822) Weight:  [118.3 kg] 118.3 kg (05/17 0500)  Weight change: -1 kg Filed Weights   09/28/20 0340 09/29/20 0500 09/30/20 0500  Weight: 112.7 kg 119.3 kg 118.3 kg    Intake/Output:    Intake/Output Summary (Last 24 hours) at 09/30/2020 0851 Last data filed at 09/30/2020 0800 Gross per 24 hour  Intake 2308.13 ml  Output 904 ml  Net 1404.13 ml    Physical Exam: General:  No acute distress, laying in the bed  HEENT   ETT in place  Pulm/lungs   Vent assisted  CVS/Heart  regular rhythm, no rub or gallop  Abdomen:   Soft, nontender, gall bladder drain in place  Extremities:  No peripheral edema  Neurologic:  sedated  Skin:  No acute rashes   foley in place Left IJ tempo cath 09/27/20- ICU team  Basic Metabolic Panel:  Recent Labs  Lab 09/26/20 0420 09/27/20 0416 09/28/20 0418 09/29/20 0456 09/30/20 0514  NA 135 131* 134* 131* 135  K 3.9 4.6 4.3 3.8 3.5  CL 98 95* 93* 87* 94*  CO2 17* 19* 25 28 29   GLUCOSE 148* 241* 230* 228* 148*  BUN 51* 74* 74* 86* 81*  CREATININE 4.76* 5.42* 5.42* 6.13* 5.49*  CALCIUM 7.9* 6.8* 7.1* 6.7* 7.0*  MG 2.4 2.5* 2.4 2.5* 2.4  PHOS 7.5* 8.4* 8.8* 8.6* 6.8*      CBC: Recent Labs  Lab 09/25/20 1444 09/26/20 0420 09/27/20 0416 09/28/20 0418 09/29/20 0456 09/30/20 0514  WBC 25.5* 45.5* 29.2* 36.9* 27.0* 19.3*  NEUTROABS 24.0*  --  26.0* 32.5* 23.2*  --   HGB 11.5* 10.3* 10.7* 11.5* 10.5* 9.6*  HCT 34.1* 31.2* 30.7* 33.4* 30.1* 28.7*  MCV 80.4 81.9 77.9* 79.1* 78.0* 79.5*  PLT 535* 524* 465* 562* 475* 373      Lab Results  Component Value Date   HEPBSAG NON REACTIVE 09/26/2020   HEPBSAB NON REACTIVE 09/26/2020   HEPBIGM NON REACTIVE 09/26/2020      Microbiology:  Recent Results (from the past 240 hour(s))  Culture, blood (routine x 2)     Status: Abnormal   Collection Time: 09/25/20  2:41 PM   Specimen: BLOOD  Result Value Ref Range Status   Specimen Description   Final    BLOOD RIGHT ANTECUBITAL Performed at [facility], [address] [place], [place] [address]    Special Requests   Final    BOTTLES DRAWN AEROBIC AND ANAEROBIC Blood Culture results may not be optimal due to an excessive volume of blood received in culture bottles Performed at [facility], [address] [place], [place] [address]    Culture  Setup Time  Final    Organism ID to follow GRAM POSITIVE COCCI IN BOTH AEROBIC AND ANAEROBIC BOTTLES CRITICAL RESULT CALLED TO, READ BACK BY AND VERIFIED WITH: LISA KLUTTZ @0802  ON 09/26/20.[place] Performed at [facility], [address] [place], [place] [address]    Culture ENTEROCOCCUS FAECALIS (A)  Final   Report Status 09/28/2020 FINAL  Final   Organism ID, Bacteria ENTEROCOCCUS FAECALIS  Final      Susceptibility   Enterococcus faecalis - MIC*    AMPICILLIN <=2 SENSITIVE Sensitive     VANCOMYCIN 1 SENSITIVE Sensitive     GENTAMICIN SYNERGY SENSITIVE Sensitive     * ENTEROCOCCUS FAECALIS  Blood Culture ID Panel (Reflexed)     Status: Abnormal   Collection Time: 09/25/20  2:41 PM  Result Value Ref Range Status   Enterococcus faecalis DETECTED (A) NOT DETECTED Final     Comment: CRITICAL RESULT CALLED TO, READ BACK BY AND VERIFIED WITH: LISA KLUTTZ @0802  ON 09/26/20.[place]    Enterococcus Faecium NOT DETECTED NOT DETECTED Final   Listeria monocytogenes NOT DETECTED NOT DETECTED Final   Staphylococcus species NOT DETECTED NOT DETECTED Final   Staphylococcus aureus (BCID) NOT DETECTED NOT DETECTED Final   Staphylococcus epidermidis NOT DETECTED NOT DETECTED Final   Staphylococcus lugdunensis NOT DETECTED NOT DETECTED Final   Streptococcus species NOT DETECTED NOT DETECTED Final   Streptococcus agalactiae NOT DETECTED NOT DETECTED Final   Streptococcus pneumoniae NOT DETECTED NOT DETECTED Final   Streptococcus pyogenes NOT DETECTED NOT DETECTED Final   A.calcoaceticus-baumannii NOT DETECTED NOT DETECTED Final   Bacteroides fragilis DETECTED (A) NOT DETECTED Final    Comment: CRITICAL RESULT CALLED TO, READ BACK BY AND VERIFIED WITH: LISA KLUTTZ @0802  ON 09/26/20.[place]    Enterobacterales NOT DETECTED NOT DETECTED Final   Enterobacter cloacae complex NOT DETECTED NOT DETECTED Final   Escherichia coli NOT DETECTED NOT DETECTED Final   Klebsiella aerogenes NOT DETECTED NOT DETECTED Final   Klebsiella oxytoca NOT DETECTED NOT DETECTED Final   Klebsiella pneumoniae NOT DETECTED NOT DETECTED Final   Proteus species NOT DETECTED NOT DETECTED Final   Salmonella species NOT DETECTED NOT DETECTED Final   Serratia marcescens NOT DETECTED NOT DETECTED Final   Haemophilus influenzae NOT DETECTED NOT DETECTED Final   Neisseria meningitidis NOT DETECTED NOT DETECTED Final   Pseudomonas aeruginosa NOT DETECTED NOT DETECTED Final   Stenotrophomonas maltophilia NOT DETECTED NOT DETECTED Final   Candida albicans NOT DETECTED NOT DETECTED Final   Candida auris NOT DETECTED NOT DETECTED Final   Candida glabrata NOT DETECTED NOT DETECTED Final   Candida krusei NOT DETECTED NOT DETECTED Final   Candida parapsilosis NOT DETECTED NOT DETECTED Final   Candida tropicalis NOT  DETECTED NOT DETECTED Final   Cryptococcus neoformans/gattii NOT DETECTED NOT DETECTED Final   Vancomycin resistance NOT DETECTED NOT DETECTED Final    Comment: Performed at [facility], [address] [place], [place] [address]  Culture, blood (routine x 2)     Status: None (Preliminary result)   Collection Time: 09/25/20  3:42 PM   Specimen: BLOOD  Result Value Ref Range Status   Specimen Description BLOOD BLOOD LEFT HAND  Final   Special Requests   Final    BOTTLES DRAWN AEROBIC AND ANAEROBIC Blood Culture adequate volume   Culture   Final    NO GROWTH 4 DAYS Performed at [facility], [address], [place], [place] [address]    Report Status PENDING  Incomplete  Resp Panel by  RT-PCR (Flu A&B, Covid) Nasopharyngeal Swab     Status: None   Collection Time: 09/25/20  3:51 PM   Specimen: Nasopharyngeal Swab; Nasopharyngeal(NP) swabs in vial transport medium  Result Value Ref Range Status   SARS Coronavirus 2 by RT PCR NEGATIVE NEGATIVE Final    Comment: (NOTE) SARS-CoV-2 target nucleic acids are NOT DETECTED.  The SARS-CoV-2 RNA is generally detectable in upper respiratory specimens during the acute phase of infection. The lowest concentration of SARS-CoV-2 viral copies this assay can detect is 138 copies/mL. A negative result does not preclude SARS-Cov-2 infection and should not be used as the sole basis for treatment or other patient management decisions. A negative result may occur with  improper specimen collection/handling, submission of specimen other than nasopharyngeal swab, presence of viral mutation(s) within the areas targeted by this assay, and inadequate number of viral copies(<138 copies/mL). A negative result must be combined with clinical observations, patient history, and epidemiological information. The expected result is Negative.  Fact Sheet for Patients:  EntrepreneurPulse.com.au  Fact Sheet for Healthcare  Providers:  IncredibleEmployment.be  This test is no t yet approved or cleared by the [place] FDA and  has been authorized for detection and/or diagnosis of SARS-CoV-2 by FDA under an Emergency Use Authorization (EUA). This EUA will remain  in effect (meaning this test can be used) for the duration of the COVID-19 declaration under Section 564(b)(1) of the Act, 21 U.S.C.section 360bbb-3(b)(1), unless the authorization is terminated  or revoked sooner.       Influenza A by PCR NEGATIVE NEGATIVE Final   Influenza B by PCR NEGATIVE NEGATIVE Final    Comment: (NOTE) The Xpert Xpress SARS-CoV-2/FLU/RSV plus assay is intended as an aid in the diagnosis of influenza from Nasopharyngeal swab specimens and should not be used as a sole basis for treatment. Nasal washings and aspirates are unacceptable for Xpert Xpress SARS-CoV-2/FLU/RSV testing.  Fact Sheet for Patients: EntrepreneurPulse.com.au  Fact Sheet for Healthcare Providers: IncredibleEmployment.be  This test is not yet approved or cleared by the [place] FDA and has been authorized for detection and/or diagnosis of SARS-CoV-2 by FDA under an Emergency Use Authorization (EUA). This EUA will remain in effect (meaning this test can be used) for the duration of the COVID-19 declaration under Section 564(b)(1) of the Act, 21 U.S.C. section 360bbb-3(b)(1), unless the authorization is terminated or revoked.  Performed at [facility], [address] [place], [place] [address]   MRSA PCR Screening     Status: None   Collection Time: 09/25/20  6:44 PM   Specimen: Nasopharyngeal  Result Value Ref Range Status   MRSA by PCR NEGATIVE NEGATIVE Final    Comment:        The GeneXpert MRSA Assay (FDA approved for NASAL specimens only), is one component of a comprehensive MRSA colonization surveillance program. It is not intended to diagnose MRSA infection nor  to guide or monitor treatment for MRSA infections. Performed at [facility], [address] [place], [place] [address]   Aerobic/Anaerobic Culture (surgical/deep wound)     Status: None   Collection Time: 09/25/20  9:02 PM   Specimen: Abscess  Result Value Ref Range Status   Specimen Description   Final    ABSCESS Performed at [facility], [address], [place], [place] [address]    Special Requests   Final    Normal Performed at [facility], [address] [place], [place] [address]    Gram Stain  Final    ABUNDANT WBC PRESENT,BOTH PMN AND MONONUCLEAR MODERATE GRAM NEGATIVE COCCOBACILLI    Culture   Final    ABUNDANT ENTEROCOCCUS FAECALIS MODERATE BACTEROIDES FRAGILIS BETA LACTAMASE POSITIVE Performed at [facility], [employer] [address] [place], [place] [address]    Report Status 09/28/2020 FINAL  Final   Organism ID, Bacteria ENTEROCOCCUS FAECALIS  Final      Susceptibility   Enterococcus faecalis - MIC*    AMPICILLIN <=2 SENSITIVE Sensitive     VANCOMYCIN 1 SENSITIVE Sensitive     GENTAMICIN SYNERGY SENSITIVE Sensitive     * ABUNDANT ENTEROCOCCUS FAECALIS  Urine culture     Status: None   Collection Time: 09/26/20  1:36 PM   Specimen: Urine, Random  Result Value Ref Range Status   Specimen Description   Final    URINE, RANDOM Performed at [facility], [address], [place], [place] [address]    Special Requests   Final    NONE Performed at [facility], [address], [place], [place] [address]    Culture   Final    NO GROWTH Performed at [facility], [place] [address], [place], [place] [address]    Report Status 09/28/2020 FINAL  Final    Coagulation Studies: No results for input(s): LABPROT, INR in the last 72 hours.  Urinalysis: No results for input(s): COLORURINE, LABSPEC, PHURINE, GLUCOSEU, HGBUR, BILIRUBINUR, KETONESUR, PROTEINUR, UROBILINOGEN, NITRITE, LEUKOCYTESUR in the last 72  hours.  Invalid input(s): APPERANCEUR    Imaging: DG Abd 1 View  Result Date: 09/29/2020 CLINICAL DATA:  NG tube placement EXAM: ABDOMEN - 1 VIEW COMPARISON:  09/25/2020 FINDINGS: Limited radiograph of the lower chest and upper abdomen was obtained for the purposes of enteric tube localization. Enteric tube is seen coursing below the diaphragm with distal tip and side port terminating within the expected location of the gastric body. A pigtail drainage catheter is partially visualized within the right upper quadrant of the abdomen. IMPRESSION: Enteric tube tip and side port project within the expected location of the gastric body. Electronically Signed   By: Davina Poke D.O.   On: 09/29/2020 08:09     Medications:   . sodium chloride Stopped (09/25/20 1647)  . sodium chloride    . sodium chloride    . fentaNYL infusion INTRAVENOUS 75 mcg/hr (09/30/20 0800)  . fluconazole (DIFLUCAN) IV    . norepinephrine (LEVOPHED) Adult infusion 1 mcg/min (09/30/20 0600)  . piperacillin-tazobactam (ZOSYN)  IV Stopped (09/30/20 0546)  . propofol (DIPRIVAN) infusion 10 mcg/kg/min (09/30/20 0600)   . chlorhexidine gluconate (MEDLINE KIT)  15 mL Mouth Rinse BID  . Chlorhexidine Gluconate Cloth  6 each Topical Daily  . docusate  100 mg Per Tube BID  . feeding supplement (PROSource TF)  90 mL Per Tube QID  . feeding supplement (VITAL HIGH PROTEIN)  1,000 mL Per Tube Q24H  . free water  30 mL Per Tube Q4H  . heparin injection (subcutaneous)  5,000 Units Subcutaneous Q8H  . hydrocortisone sod succinate (SOLU-CORTEF) inj  50 mg Intravenous Q12H  . insulin aspart  0-20 Units Subcutaneous Q4H  . insulin glargine  10 Units Subcutaneous QHS  . mouth rinse  15 mL Mouth Rinse 10 times per day  . midodrine  10 mg Per Tube TID WC  . multivitamin  1 tablet Per Tube QHS  . multivitamin  15 mL Per Tube Daily  . pantoprazole (PROTONIX) IV  40 mg Intravenous Q24H  .  polyethylene glycol  17 g Per Tube Daily  .  sodium chloride flush  3 mL Intravenous Q12H  . sodium chloride flush  5 mL Intracatheter Q8H   sodium chloride, acetaminophen, albuterol, docusate, fentaNYL (SUBLIMAZE) injection, haloperidol lactate, midazolam, ondansetron (ZOFRAN) IV, polyethylene glycol, sodium chloride flush  Assessment/ Plan:  65 y.o. male with hypertension, diabetes mellitus type II, bladder cancer, admitted on 09/25/2020 for Septic shock (HCC) [A41.9, R65.21] Altered mental status, unspecified altered mental status type [R41.82] Postprocedural intraabdominal abscess [T81.43XA] Sepsis with acute renal failure and septic shock, due to unspecified organism, unspecified acute renal failure type ([place]) [A41.9, R65.21, N17.9]   # AKI  baseline creatinine of 1.1, GFR >60 on 08/19/20.  Lab Results  Component Value Date   CREATININE 5.49 (H) 09/30/2020   CREATININE 6.13 (H) 09/29/2020   CREATININE 5.42 (H) 09/28/2020   05/16 0701 - 05/17 0700 In: 2240.3 [I.V.:1176; NG/GT:370; IV Piggyback:694.3] Out: 904 [Urine:975; Emesis/NG output:25]  AKI likely secondary to ATN from sepsis  fair UOP   Tolerated HD well on 5/16 Assess daily Electrolytes and Volume status are acceptable No acute indication for Dialysis at present   # Sepsis, Gall bladder Abscess, S/p robotic assisted Lap [place] 09/08/2020 S/p CT guided GB abscess drain placement by VIR, on 5/12 Blood culture positive for Bacteroides and enterococcus on 5/12 - requiring pressors -zosyn   # hyperphosphatemia -in setting of renal failure  - Expected to correct some with HD  # Acute resp failure Vent dependent now intubated 09/27/20 Possible extubation later today    LOS: [place] 5/17/20228:51 AM  [place], [place]  Note: This note was prepared with Dragon dictation. Any transcription errors are unintentional

## 2020-09-30 NOTE — Progress Notes (Signed)
Patient was extubated per Dr order. There was no issues with extubation. Patient placed on nasal cannula at 2L. Patient's oxygen saturation is 95% on 2L. Patient able to speak after extubation.

## 2020-09-30 NOTE — TOC Initial Note (Addendum)
Transition of Care [facility]) - Initial/Assessment Note    Patient Details  Name: Richard Oconnell MRN: [id number] Date of Birth: September 10, 1955  [facility] CM/SW Contact:    Ova Freshwater Phone Number: [phone] 09/30/2020, 3:07 PM  Clinical Narrative:                  Patient presents to [facility] due to altered mental status and changes in speech witnessed by his brother Marquon Alcala [phone].  Patient was 2 weeks post-op from gallbladder surgery. Patient is currently in the ICU, sedated and intubated. Patient's brother Miguel Christiana and sister Luanna Salk [phone] spoke with [place] NP yesterday 5/16 to discuss goals of care.  Currently the patient remains FULL CODE, but siblings stated the patient has told them he did not want to "live a life if endless appointments or medical apparatuses."  TOC will continue to follow for disposition.   CSW called [facility] [phone] but was unable to leave a message due to full voicemail.  CSW called and left voicemail for Luanna Salk [phone], requesting a return call.  Expected Discharge Plan: [place] Barriers to Discharge: Continued Medical Work up   Patient Goals and CMS Choice        Expected Discharge Plan and Services Expected Discharge Plan: [place] In-house Referral: Clinical Social Work   Post Acute Care Choice: [place] Living arrangements for the past 2 months: [place]                                      Prior Living Arrangements/Services Living arrangements for the past 2 months: Single Family Home Lives with:: Self Patient language and need for interpreter reviewed:: Yes Do you feel safe going back to the place where you live?: Yes      Need for Family Participation in Patient Care: Yes (Comment) Care giver support system in place?: Yes (comment)   Criminal Activity/Legal Involvement Pertinent to Current  Situation/Hospitalization: Yes - Comment as needed  Activities of Daily Living      Permission Sought/Granted Permission sought to share information with : Family Supports    Share Information with NAME: Denilson, Salminen (Brother)   [phone], [place] Qua [phone]           Emotional Assessment Appearance:: Appears older than stated age Attitude/Demeanor/Rapport: Unable to Assess Affect (typically observed): Unable to Assess     Psych Involvement: No (comment)  Admission diagnosis:  Septic shock ([place]) [A41.9, R65.21] Altered mental status, unspecified altered mental status type [R41.82] Postprocedural intraabdominal abscess [T81.43XA] Sepsis with acute renal failure and septic shock, due to unspecified organism, unspecified acute renal failure type ([place]) [A41.9, R65.21, N17.9] Patient Active Problem List   Diagnosis Date Noted  . Acute respiratory failure ([place]) 09/28/2020  . Encounter for nasogastric (NG) tube placement   . Altered mental status   . Abscess   . Acute kidney failure, unspecified ([place])   . Sepsis with acute renal failure and septic shock ([place]) 09/25/2020  . S/P laparoscopic cholecystectomy 09/08/2020  . Metabolic syndrome 16/60/6301  . Class 1 obesity due to excess calories with serious comorbidity and body mass index (BMI) of 33.0 to 33.9 in adult 07/06/2019  . Malignant neoplasm of urinary bladder ([place]) 12/06/2018  . Overweight (BMI 25.0-29.9) 07/04/2018  . Hyperlipidemia associated with type 2 diabetes mellitus ([place]) 11/30/2016  .  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 Gross hematuria 11/16/2016  . Erectile dysfunction associated with type 2 diabetes mellitus ([place]) 05/26/2016  . Diabetes mellitus with coincident hypertension ([place]) 06/11/2015  . Type 2 diabetes mellitus with diabetic polyneuropathy, without long-term current use of insulin ([place]) 06/11/2015   PCP:  [name], DO Pharmacy:   [facility], [place] [address] Phone: [phone] Fax: [facility], [place]. [place] [address] Phone: [phone] Fax: [place] #[id number] [facility], [place] [address] Phone: [phone] Fax: [phone]     Social Determinants of Health (SDOH) Interventions    Readmission Risk Interventions No flowsheet data found.

## 2020-09-30 NOTE — Progress Notes (Addendum)
NAME:  Richard Oconnell, MRN:  [id number], DOB:  08/05/1955, LOS: 55 ADMISSION DATE:  09/25/2020 REFERRING MD:  Charna Archer CHIEF COMPLAINT:  Feeling bad and belly pain  Brief Pt Description/Synopsis:  65 yo male with previous dx of acute cholecystitis s/p cholecystotomy tube 2/24 and then follow up robotic assisted cholecystectomy 4/25, now presents with large abdominal abscess with severe septic shock and BACTEREMIA, with multiorgan failure (liver and renal failure) complicated by severe metabolic acidosis and toxic metabolic encephalopathy.  History of Present Illness:  65 year old man past medical history of hypertension, diabetes, and bladder cancer who was hospitalized with acute cholecystitis-several months ago 2/24 He was temporized with a percutaneous cholecystostomy tube.    Follow up  cholangiogram via the tube demonstrates that the cystic duct is still obstructed He was offered him a robot-assisted laparoscopic cholecystectomy, but  deferred this procedure  On 4/25 recently had cholecystectomy along with significant lysis of adhesions on 4/25 here at [facility].  He presents today with somnolence and confusion.   Family reports that patient is disoriented the day before yesterday, after which his skin has begun to appear increasingly yellow.    +discomfort in the right upper quadrant of his abdomen, liver failure, renal failure, severe acidosis Patient started on pressors, admitted for septic shock  WBC 25 LA 6.5-->8.7 CREAT 4.8 TB 9.5   Pertinent  Medical History  2/24 admitted for acute cholecystitis,percutaneous cholecystostomy tube placed by IR  3/29 offered  a robot-assisted laparoscopic cholecystectomy, but deferred  4/25 s/p cholecystectomy along with significant lysis of adhesions on 4/25 here at [facility].   Significant Hospital Events: Including procedures, antibiotic start and stop dates in addition to other pertinent events   2/24 admitted for acute  cholecystitis,percutaneous cholecystostomy tube placed by IR  3/29 offered  a robot-assisted laparoscopic cholecystectomy, but deferred  4/25 s/p cholecystectomy along with significant lysis of adhesions on 4/25 here at [facility]. 5/12 admission to ICU for large abd abscess, jaundice and septic shock, on pressors 5/12 CT abd-Large abscess in the gallbladder fossa, at least 10 cm in size; Percutaneous drain placed by IR 5/13: Nephrology following, starting Bicarb gtt, remains on Levophed; Blood cultures growing Enterococcus faecalis,Bacteroides fragilis.  Pt extremely delirious and agitated, trying to leave to go home, interfering with medical treatment, plan to start Precedex. High risk for intubation due to severe Encephalopathy 5/14severe sepsis, high risk for intubation, on precedex,  5/14 INTUBATED AND LEFT TRIALYSIS CATHETER PLACED 5/16: Plan for SBT following Hemodialysis.  Add Fluconazole for intraabdominal coverage 5/17: Repeat CT Abdomen/Pelvis @ 09:00.  Plan for SBT and hopeful for extubation as pt following commands this morning. Levophed currently weaned off   Micro Data:  09/25/2020: SARS-CoV-2 PCR>> negative 09/25/2020: Influenza PCR>> negative 09/25/2020: Blood culture x2>>Enterococcus faecalis,Bacteroides fragilis 09/25/2020: MRSA PCR>> negative 09/25/2020: Aerobic/anaerobic culture from abdominal abscess>> ENTEROCOCCUS FAECALIS, BACTEROIDES FRAGILIS, BETA LACTAMASE POSITIVE (sensitive to Ampicillin, Gentamycin, Vancomycin) 09/26/2020: Urine>>no growth  Antimicrobials:  Zosyn 5/12>> Fluconazole 5/16>>   Antibiotics Given (last 72 hours)    Date/Time Action Medication Dose Rate   09/27/20 1159 New Bag/Given   piperacillin-tazobactam (ZOSYN) 2.25 g in sodium chloride 0.9 % 50 mL IVPB 2.25 g 100 mL/hr   09/27/20 1711 New Bag/Given   piperacillin-tazobactam (ZOSYN) 2.25 g in sodium chloride 0.9 % 50 mL IVPB 2.25 g 100 mL/hr   09/28/20 0017 New Bag/Given   piperacillin-tazobactam  (ZOSYN) 2.25 g in sodium chloride 0.9 % 50 mL IVPB 2.25 g 100 mL/hr   09/28/20 0522  New Bag/Given   piperacillin-tazobactam (ZOSYN) 2.25 g in sodium chloride 0.9 % 50 mL IVPB 2.25 g 100 mL/hr   09/28/20 1747 New Bag/Given   piperacillin-tazobactam (ZOSYN) IVPB 2.25 g 2.25 g 100 mL/hr   09/28/20 2310 New Bag/Given   piperacillin-tazobactam (ZOSYN) IVPB 2.25 g 2.25 g 100 mL/hr   09/29/20 0510 New Bag/Given   piperacillin-tazobactam (ZOSYN) IVPB 2.25 g 2.25 g 100 mL/hr   09/29/20 1337 New Bag/Given   piperacillin-tazobactam (ZOSYN) IVPB 2.25 g 2.25 g 100 mL/hr   09/29/20 1737 New Bag/Given   piperacillin-tazobactam (ZOSYN) IVPB 2.25 g 2.25 g 100 mL/hr   09/29/20 2336 New Bag/Given   piperacillin-tazobactam (ZOSYN) IVPB 2.25 g 2.25 g 100 mL/hr   09/30/20 0516 New Bag/Given   piperacillin-tazobactam (ZOSYN) IVPB 2.25 g 2.25 g 100 mL/hr      Interval History:  -No acute events reported overnight -Underwent Hemodialysis treatment yesterday; urine output 975 ml over last 24 hours (+11.2L since admit) -Afebrile, Hemodynamically stable, Levophed titrated off this morning -Pt is awake with sedation weaned, plan for SBT and hopeful for extubation after CT Abdomen/Pelvis which is scheduled for 09:00 -WBC improving to 19.3 today (27 yesterday)   Objective   Blood pressure 103/69, pulse (!) 59, temperature 98.4 F (36.9 C), temperature source Axillary, resp. rate 16, height 6' 3.98" (1.93 m), weight 118.3 kg, SpO2 98 %.    Vent Mode: PRVC FiO2 (%):  [28 %-30 %] 28 % Set Rate:  [16 bmp] 16 bmp Vt Set:  [500 mL] 500 mL PEEP:  [5 cmH20] 5 cmH20   Intake/Output Summary (Last 24 hours) at 09/30/2020 0802 Last data filed at 09/30/2020 0600 Gross per 24 hour  Intake 2240.28 ml  Output 904 ml  Net 1336.28 ml   Filed Weights   09/28/20 0340 09/29/20 0500 09/30/20 0500  Weight: 112.7 kg 119.3 kg 118.3 kg     REVIEW OF SYSTEMS Unable to assess due to Intubation, sedation, critical  illness   PHYSICAL EXAMINATION: GENERAL:acutely ill appearing made, laying in bed, intubated, WUA in progress, in NAD HEAD: Normocephalic, atraumatic.  EYES: Pupils equal, round, reactive to light.  No scleral icterus.  MOUTH: Moist MM, ETT in place NECK: Supple. No thyromegaly PULMONARY: Clear breath sounds bilaterally, no wheezing or rales, synchronous with vent, even CARDIOVASCULAR: S1 and S2. Regular rate and rhythm. No murmurs, rubs, or gallops. 2+ radial & DP pulses bilaterally GASTROINTESTINAL: Obese, nondistended, nontender, no guarding or rebound tenderness.  Percutaneous drain to RUQ is clean dry and intact   MUSCULOSKELETAL: Normal bulk and tone, no deformities,No swelling, clubbing, or edema.  NEUROLOGIC: WUA in progress, awake (slightly lethargic) but moves all extremities to command, no focal deficits noted, pupils PERRL SKIN: Warm and dry.  No obvious rashes, lesions, or ulcerations   Labs/imaging that I havepersonally reviewed  (right click and "Reselect all SmartList Selections" daily)  Labs 09/30/2020: glucose 148, BUN 81, Cr. 5.49, Albumin 1.9, WBC 19.3, Hgb 9.6, HCT 28.7 CXR 09/28/20:Endotracheal tube tip is above the carina. There is a left IJ central venous catheter with tip in the projection of the SVC. Decreased lung volumes and small bilateral pleural effusions. Atelectasis noted in the left lung base. CT abdomen and pelvis 09/25/2020>>Large abscess in the gallbladder fossa, at least 10 cm in size. No evidence free intraperitoneal fluid or distant abscess/collection. Atelectasis in both lower lobes. Aortic Atherosclerosis CT head w/o contrast 09/26/2018>>No acute or significant finding.  Mild generalized volume loss.   ASSESSMENT/PLAN   Acute  Hypoxic Respiratory Failure in the setting of severe metabolic derangements due to septic shock, AKI, and Metabolic Encephalopathy -Full vent support, implement lung protective strategies -Wean FiO2 & PEEP as needed to  maintain O2 sats >92% -Follow intermittent CXR & ABG as needed -Implement VAP bundle -SBT's when respiratory parameters met and mental status permits ~ plan for SBT today and hopeful for extubation -Prn Bronchodilators  Septic Shock>>improving -Continuous cardiac monitoring -Maintain MAP greater than 65 -Vasopressors as needed to maintain MAP goal -Stress dose steroids (weaned to  50 mg q12 on 09/29/20) -Continue Midodrine -Echocardiogram 09/27/20 with LVEF 60-65%, grade II diastolic dysfunction, RV systolic function normal; normal valves & no vegetations  Severe sepsis due to intra-abdominal abscess and BACTEREMIA (Enterococcus faecalis, Bacteroides fragilis) -Monitor fever curve -Trend WBCs and procalcitonin -Follow cultures as above -Continue Zosyn and Fluconazole -ID following, appreciate input, will defer ABX to ID -Status post Percutaneous drain placement by IR into gallbladder fossa abscess on 09/25/2020 for source control -General surgery following, appreciate input -Plan for repeat CT Abdomen & Pelvis today @ 09:00  Acute Kidney Injury Anion Gap Metabolic Acidosis -Monitor I&O's / urinary output -Follow BMP -Ensure adequate renal perfusion -Avoid nephrotoxic agents as able -Replace electrolytes as indicated -Nephrology following, appreciate input -HD as per Nephrology  Acute toxic metabolic encephalopathy, suspect in setting of Sepsis, AKI, and sleep deprivation Sedation needs in setting of mechanical ventilation -Maintain RASS of -1 -Fentanyl and Propofol as needed -Avoid sedating meds as able -Daily wake up assessment -CT Head 09/25/20 negative -Urine drug screen + for Benzodiazepines  Anemia without s/sx of overt blood loss -Monitor for S/Sx of bleeding -Trend CBC -SCD's for VTE Prophylaxis  -Transfuse for Hgb <7  Hyperglycemia -CBG's -SSI -Follow ICU Hypo/Hyperglycemia protocol.   Pt is critically ill with multiorgan failure.  Prognosis is guarded. High  risk for cardiac arrest and death.   Best practice (right click and "Reselect all SmartList Selections" daily)  Diet: NPO, tube feeds Pain/Anxiety/Delirium protocol (if indicated): Fentanyl, Propofol VAP protocol (if indicated): Yes, implemented DVT prophylaxis: Heparin SQ GI prophylaxis: PPI Glucose control:  SSI, Lantus Central venous access:  Yes, and still needed Arterial line:  N/A Foley:  Yes, and still needed Mobility:  bed rest  PT consulted: N/A Code Status:  full code Disposition: ICU  Updated pt's sister and brother at bedside 09/30/20. All questions answered.  Labs   CBC: Recent Labs  Lab 09/25/20 1444 09/26/20 0420 09/27/20 0416 09/28/20 0418 09/29/20 0456 09/30/20 0514  WBC 25.5* 45.5* 29.2* 36.9* 27.0* 19.3*  NEUTROABS 24.0*  --  26.0* 32.5* 23.2*  --   HGB 11.5* 10.3* 10.7* 11.5* 10.5* 9.6*  HCT 34.1* 31.2* 30.7* 33.4* 30.1* 28.7*  MCV 80.4 81.9 77.9* 79.1* 78.0* 79.5*  PLT 535* 524* 465* 562* 475* 997    Basic Metabolic Panel: Recent Labs  Lab 09/26/20 0420 09/27/20 0416 09/28/20 0418 09/29/20 0456 09/30/20 0514  NA 135 131* 134* 131* 135  K 3.9 4.6 4.3 3.8 3.5  CL 98 95* 93* 87* 94*  CO2 17* 19* _0 GLUCOSE 148* 241* 230* 228* 148*  BUN 51* 74* 74* 86* 81*  CREATININE 4.76* 5.42* 5.42* 6.13* 5.49*  CALCIUM 7.9* 6.8* 7.1* 6.7* 7.0*  MG 2.4 2.5* 2.4 2.5* 2.4  PHOS 7.5* 8.4* 8.8* 8.6* 6.8*   GFR: Estimated Creatinine Clearance: 18.9 mL/min (A) (by C-G formula based on SCr of 5.49 mg/dL (H)). Recent Labs  Lab 09/25/20 1502 09/25/20 1905 09/25/20 2151  09/26/20 0420 09/27/20 0416 09/28/20 0418 09/29/20 0456 09/30/20 0514  PROCALCITON  --   --   --  148.71 99.94 56.30  --   --   WBC  --   --   --  45.5* 29.2* 36.9* 27.0* 19.3*  LATICACIDVEN 8.7*  6.5* 4.4* 3.1*  --   --   --   --   --     Liver Function Tests: Recent Labs  Lab 09/25/20 1444 09/26/20 0420 09/27/20 0416 09/28/20 0418 09/29/20 0456 09/30/20 0514  AST  121* 133* 100* 91* 55*  --   ALT 56* 56* 48* 53* 42  --   ALKPHOS 459* 360* 279* 255* 213*  --   BILITOT 9.5* 6.7* 3.9* 2.9* 2.2*  --   PROT 8.2* 7.6 6.8 6.3* 5.8*  --   ALBUMIN 2.6* 2.4* 2.0* 1.9* 1.8* 1.9*   No results for input(s): LIPASE, AMYLASE in the last 168 hours. Recent Labs  Lab 09/25/20 1502  AMMONIA 19    ABG    Component Value Date/Time   PHART 7.35 09/27/2020 1155   PCO2ART 42 09/27/2020 1155   PO2ART 71 (L) 09/27/2020 1155   HCO3 23.2 09/27/2020 1155   ACIDBASEDEF 2.3 (H) 09/27/2020 1155   O2SAT 93.1 09/27/2020 1155     Coagulation Profile: Recent Labs  Lab 09/25/20 1444  INR 1.5*    Cardiac Enzymes: No results for input(s): CKTOTAL, CKMB, CKMBINDEX, TROPONINI in the last 168 hours.  HbA1C: Hemoglobin A1C  Date/Time Value Ref Range Status  12/27/2019 12:00 AM 6.9  Final    Comment:    [facility]   Hgb A1c MFr Bld  Date/Time Value Ref Range Status  09/26/2020 04:20 AM 6.7 (H) 4.8 - 5.6 % Final    Comment:    (NOTE) Pre diabetes:          5.7%-6.4%  Diabetes:              >6.4%  Glycemic control for   <7.0% adults with diabetes   05/30/2020 10:47 AM 7.2 (H) <5.7 % of total Hgb Final    Comment:    For someone without known diabetes, a hemoglobin A1c value of 6.5% or greater indicates that they may have  diabetes and this should be confirmed with a follow-up  test. . For someone with known diabetes, a value <7% indicates  that their diabetes is well controlled and a value  greater than or equal to 7% indicates suboptimal  control. A1c targets should be individualized based on  duration of diabetes, age, comorbid conditions, and  other considerations. . Currently, no consensus exists regarding use of hemoglobin A1c for diagnosis of diabetes for children. .     CBG: Recent Labs  Lab 09/29/20 1559 09/29/20 1920 09/29/20 2320 09/30/20 0313 09/30/20 0715  GLUCAP 177* 168* 175* 157* 174*     Past Medical History:  He,   has a past medical history of Cancer ([place]), Erectile dysfunction associated with type 2 diabetes mellitus ([place]) (05/26/2016), Gross hematuria (11/16/2016), Hypertension (06/11/2015), and Type 2 diabetes mellitus with diabetic polyneuropathy, without long-term current use of insulin ([place]) (06/11/2015).   Surgical History:   Past Surgical History:  Procedure Laterality Date  . boil lanceted     hand  . COLONOSCOPY WITH PROPOFOL N/A 07/03/2020   Procedure: COLONOSCOPY WITH PROPOFOL;  Surgeon: Jonathon Bellows, MD;  Location: [facility] ENDOSCOPY;  Service: Gastroenterology;  Laterality: N/A;  . CYSTOSCOPY W/ RETROGRADES Bilateral 12/06/2016   Procedure: CYSTOSCOPY  WITH RETROGRADE PYELOGRAM;  Surgeon: Hollice Espy, MD;  Location: [facility] ORS;  Service: Urology;  Laterality: Bilateral;  . CYSTOSCOPY W/ RETROGRADES Bilateral 06/27/2017   Procedure: CYSTOSCOPY WITH RETROGRADE PYELOGRAM;  Surgeon: Hollice Espy, MD;  Location: [facility] ORS;  Service: Urology;  Laterality: Bilateral;  . CYSTOSCOPY WITH BIOPSY N/A 06/27/2017   Procedure: CYSTOSCOPY WITH Bladder BIOPSY;  Surgeon: Hollice Espy, MD;  Location: [facility] ORS;  Service: Urology;  Laterality: N/A;  . CYSTOSCOPY WITH BIOPSY N/A 10/24/2017   Procedure: CYSTOSCOPY WITH Bladder BIOPSY;  Surgeon: Hollice Espy, MD;  Location: [facility] ORS;  Service: Urology;  Laterality: N/A;  . ingrown  Bilateral    ingrown toenail  . IR BILIARY DRAIN PLACEMENT WITH CHOLANGIOGRAM  07/10/2020  . IR CHOLANGIOGRAM EXISTING TUBE  08/07/2020  . IR RADIOLOGIST EVAL & MGMT  08/07/2020  . TRANSURETHRAL RESECTION OF BLADDER TUMOR N/A 12/06/2016   Procedure: TRANSURETHRAL RESECTION OF BLADDER TUMOR (TURBT) (2-5cm) CLOT EVACUATION;  Surgeon: Hollice Espy, MD;  Location: [facility] ORS;  Service: Urology;  Laterality: N/A;  . TRANSURETHRAL RESECTION OF BLADDER TUMOR N/A 06/27/2017   Procedure: TRANSURETHRAL RESECTION OF BLADDER TUMOR (TURBT);  Surgeon: Hollice Espy, MD;  Location: [facility] ORS;  Service:  Urology;  Laterality: N/A;  . TRANSURETHRAL RESECTION OF BLADDER TUMOR WITH MITOMYCIN-C N/A 01/18/2017   Procedure: TRANSURETHRAL RESECTION OF BLADDER TUMOR WITH MITOMYCIN-C-(SMALL);  Surgeon: Hollice Espy, MD;  Location: [facility] ORS;  Service: Urology;  Laterality: N/A;     Social History:   reports that he has never smoked. He has never used smokeless tobacco. He reports that he does not drink alcohol and does not use drugs.   Family History:  His family history includes Heart attack in his maternal aunt, maternal grandfather, maternal grandmother, and mother; Heart disease in his maternal grandfather and mother; Parkinson's disease in his father. There is no history of Prostate cancer, Bladder Cancer, or Kidney cancer.   Allergies Allergies  Allergen Reactions  . Cyclobenzaprine Other (See Comments)    Not improving pain and makes him agressive.  . Lisinopril Cough     Home Medications  Prior to Admission medications   Medication Sig Start Date End Date Taking? Authorizing Provider  ibuprofen (ADVIL) 400 MG tablet Take 1 tablet (400 mg total) by mouth every 6 (six) hours as needed. 09/09/20  Yes Edison Simon R, PA-C  metoprolol tartrate (LOPRESSOR) 25 MG tablet Take 0.5 tablets (12.5 mg total) by mouth 2 (two) times daily. 09/19/20  Yes Karamalegos, Devonne Doughty, DO  oxyCODONE (OXY IR/ROXICODONE) 5 MG immediate release tablet Take 1 tablet (5 mg total) by mouth every 6 (six) hours as needed for severe pain or breakthrough pain. 09/09/20  Yes Edison Simon R, PA-C  pravastatin (PRAVACHOL) 20 MG tablet Take 1 tablet (20 mg total) by mouth daily. 05/30/20  Yes Karamalegos, Devonne Doughty, DO  zolpidem (AMBIEN) 5 MG tablet Take 1 tablet (5 mg total) by mouth at bedtime as needed for sleep. 08/19/20  Yes Karamalegos, Devonne Doughty, DO  acetaminophen (TYLENOL) 325 MG tablet Take 1 tablet (325 mg total) by mouth every 6 (six) hours as needed for mild pain, fever or headache (or Fever >/= 101). Patient  not taking: No sig reported 07/12/20   Cherene Altes, MD  diphenhydramine-acetaminophen (TYLENOL PM) 25-500 MG TABS tablet Take 1 tablet by mouth at bedtime as needed. Patient not taking: No sig reported    [provider]  OZEMPIC, 0.25 OR 0.5 MG/DOSE, 2 MG/1.5ML SOPN Inject 1 mg into  the skin once a week. Patient taking differently: Inject 1 mg into the skin every Thursday. 05/30/20   [name], DO     Critical Care Time: 35 minutes    Darel Hong, AGACNP-BC [facility] Prefer epic messenger for cross cover needs If after hours, please call E-link

## 2020-09-30 NOTE — Progress Notes (Signed)
Patient transported to CT and back to ICU on the ventilator. No issues during trip.

## 2020-09-30 NOTE — Progress Notes (Signed)
                                                                                                                                                                                                         Daily Progress Note   Patient Name: Richard Oconnell       Date: 09/30/2020 DOB: 06/20/1955  Age: 65 y.o. MRN#: [id number] Attending Physician: Gonzalez, Carmen L, MD Primary Care Physician: Karamalegos, Alexander J, DO Admit Date: 09/25/2020  Reason for Consultation/Follow-up: Establishing goals of care  Subjective: Patient is resting in bed on ventilator. Plans for extubation today. Will continue to follow.   Length of Stay: 5  Current Medications: Scheduled Meds:  . chlorhexidine gluconate (MEDLINE KIT)  15 mL Mouth Rinse BID  . Chlorhexidine Gluconate Cloth  6 each Topical Daily  . docusate  100 mg Per Tube BID  . feeding supplement (PROSource TF)  90 mL Per Tube QID  . feeding supplement (VITAL HIGH PROTEIN)  1,000 mL Per Tube Q24H  . free water  30 mL Per Tube Q4H  . heparin injection (subcutaneous)  5,000 Units Subcutaneous Q8H  . hydrocortisone sod succinate (SOLU-CORTEF) inj  50 mg Intravenous Q12H  . insulin aspart  0-20 Units Subcutaneous Q4H  . insulin glargine  10 Units Subcutaneous QHS  . mouth rinse  15 mL Mouth Rinse 10 times per day  . midodrine  10 mg Per Tube TID WC  . multivitamin  1 tablet Per Tube QHS  . multivitamin  15 mL Per Tube Daily  . pantoprazole (PROTONIX) IV  40 mg Intravenous Q24H  . polyethylene glycol  17 g Per Tube Daily  . sodium chloride flush  3 mL Intravenous Q12H  . sodium chloride flush  5 mL Intracatheter Q8H    Continuous Infusions: . sodium chloride Stopped (09/25/20 1647)  . sodium chloride    . sodium chloride    . fentaNYL infusion INTRAVENOUS 25 mcg/hr (09/30/20 1400)  . fluconazole (DIFLUCAN) IV    . norepinephrine (LEVOPHED) Adult infusion Stopped (09/30/20 0800)  . piperacillin-tazobactam (ZOSYN)  IV Stopped (09/30/20  1159)  . propofol (DIPRIVAN) infusion Stopped (09/30/20 1351)    PRN Meds: sodium chloride, acetaminophen, albuterol, docusate, fentaNYL (SUBLIMAZE) injection, haloperidol lactate, midazolam, ondansetron (ZOFRAN) IV, polyethylene glycol, sodium chloride flush  Physical Exam Constitutional:      Comments: On ventilator.              Vital Signs: BP 111/69   Pulse 64   Temp 98.4 F (36.9 C) (Axillary)   Resp   10   Ht 6' 3.98" (1.93 m)   Wt 118.3 kg   SpO2 95%   BMI 31.76 kg/m  SpO2: SpO2: 95 % O2 Device: O2 Device: Ventilator O2 Flow Rate: O2 Flow Rate (L/min): 2 L/min  Intake/output summary:   Intake/Output Summary (Last 24 hours) at 09/30/2020 1533 Last data filed at 09/30/2020 1400 Gross per 24 hour  Intake 1429.96 ml  Output 1000 ml  Net 429.96 ml   LBM: Last BM Date: 09/26/20 Baseline Weight: Weight: 113 kg Most recent weight: Weight: 118.3 kg         Patient Active Problem List   Diagnosis Date Noted  . Acute respiratory failure ([place]) 09/28/2020  . Encounter for nasogastric (NG) tube placement   . Altered mental status   . Abscess   . Acute kidney failure, unspecified ([place])   . Sepsis with acute renal failure and septic shock ([place]) 09/25/2020  . S/P laparoscopic cholecystectomy 09/08/2020  . Metabolic syndrome 11/25/1973  . Class 1 obesity due to excess calories with serious comorbidity and body mass index (BMI) of 33.0 to 33.9 in adult 07/06/2019  . Malignant neoplasm of urinary bladder ([place]) 12/06/2018  . Overweight (BMI 25.0-29.9) 07/04/2018  . Hyperlipidemia associated with type 2 diabetes mellitus ([place]) 11/30/2016  . 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 Gross hematuria 11/16/2016  . Erectile dysfunction associated with type 2 diabetes mellitus ([place]) 05/26/2016  . Diabetes mellitus with coincident hypertension ([place]) 06/11/2015  . Type 2 diabetes mellitus with diabetic polyneuropathy, without long-term current use of insulin ([place]) 06/11/2015     Palliative Care Assessment & Plan    Recommendations/Plan:  Extubate today. Full code/ full scope.   Code Status:    Code Status Orders  (From admission, onward)         Start     Ordered   09/25/20 1738  Full code  Continuous        09/25/20 1739        Code Status History    Date Active Date Inactive Code Status Order ID Comments User Context   09/08/2020 1930 09/09/2020 1917 Full Code [id number]  [name], MD Inpatient   07/09/2020 1819 07/12/2020 2215 Full Code [id number]  CoxBriant Cedar, DO ED   Advance Care Planning Activity       Prognosis:   Unable to determine   Thank you for allowing the Palliative Medicine Team to assist in the care of this patient.   Total Time 15 min Prolonged Time Billed  no      Greater than 50%  of this time was spent counseling and coordinating care related to the above assessment and plan.  Asencion Gowda, NP  Please contact Palliative Medicine Team phone at [phone] for questions and concerns.

## 2020-10-01 DIAGNOSIS — A419 Sepsis, unspecified organism: Secondary | ICD-10-CM | POA: Diagnosis not present

## 2020-10-01 DIAGNOSIS — R6520 Severe sepsis without septic shock: Secondary | ICD-10-CM | POA: Diagnosis not present

## 2020-10-01 DIAGNOSIS — R4182 Altered mental status, unspecified: Secondary | ICD-10-CM

## 2020-10-01 DIAGNOSIS — B9689 Other specified bacterial agents as the cause of diseases classified elsewhere: Secondary | ICD-10-CM | POA: Diagnosis not present

## 2020-10-01 DIAGNOSIS — A4181 Sepsis due to Enterococcus: Secondary | ICD-10-CM | POA: Diagnosis not present

## 2020-10-01 DIAGNOSIS — Z7189 Other specified counseling: Secondary | ICD-10-CM | POA: Diagnosis not present

## 2020-10-01 DIAGNOSIS — R6521 Severe sepsis with septic shock: Secondary | ICD-10-CM | POA: Diagnosis not present

## 2020-10-01 DIAGNOSIS — Z515 Encounter for palliative care: Secondary | ICD-10-CM | POA: Diagnosis not present

## 2020-10-01 DIAGNOSIS — R935 Abnormal findings on diagnostic imaging of other abdominal regions, including retroperitoneum: Secondary | ICD-10-CM

## 2020-10-01 DIAGNOSIS — N179 Acute kidney failure, unspecified: Secondary | ICD-10-CM | POA: Diagnosis not present

## 2020-10-01 DIAGNOSIS — L0291 Cutaneous abscess, unspecified: Secondary | ICD-10-CM | POA: Diagnosis not present

## 2020-10-01 DIAGNOSIS — A4189 Other specified sepsis: Secondary | ICD-10-CM | POA: Diagnosis not present

## 2020-10-01 LAB — RENAL FUNCTION PANEL
Albumin: 2 g/dL — ABNORMAL LOW (ref 3.5–5.0)
Anion gap: 14 (ref 5–15)
BUN: 92 mg/dL — ABNORMAL HIGH (ref 8–23)
CO2: 27 mmol/L (ref 22–32)
Calcium: 7.1 mg/dL — ABNORMAL LOW (ref 8.9–10.3)
Chloride: 95 mmol/L — ABNORMAL LOW (ref 98–111)
Creatinine, Ser: 5.65 mg/dL — ABNORMAL HIGH (ref 0.61–1.24)
GFR, Estimated: 10 mL/min — ABNORMAL LOW (ref >60)
Glucose, Bld: 209 mg/dL — ABNORMAL HIGH (ref 70–99)
Phosphorus: 6.6 mg/dL — ABNORMAL HIGH (ref 2.5–4.6)
Potassium: 3.2 mmol/L — ABNORMAL LOW (ref 3.5–5.1)
Sodium: 136 mmol/L (ref 135–145)

## 2020-10-01 LAB — CBC
HCT: 33.4 % — ABNORMAL LOW (ref 39.0–52.0)
Hemoglobin: 11.3 g/dL — ABNORMAL LOW (ref 13.0–17.0)
MCH: 27 pg (ref 26.0–34.0)
MCHC: 33.8 g/dL (ref 30.0–36.0)
MCV: 79.7 fL — ABNORMAL LOW (ref 80.0–100.0)
Platelets: 381 10*3/uL (ref 150–400)
RBC: 4.19 MIL/uL — ABNORMAL LOW (ref 4.22–5.81)
RDW: 15.6 % — ABNORMAL HIGH (ref 11.5–15.5)
WBC: 18.9 10*3/uL — ABNORMAL HIGH (ref 4.0–10.5)
nRBC: 0 % (ref 0.0–0.2)

## 2020-10-01 LAB — GLUCOSE, CAPILLARY
Glucose-Capillary: 164 mg/dL — ABNORMAL HIGH (ref 70–99)
Glucose-Capillary: 182 mg/dL — ABNORMAL HIGH (ref 70–99)
Glucose-Capillary: 206 mg/dL — ABNORMAL HIGH (ref 70–99)
Glucose-Capillary: 225 mg/dL — ABNORMAL HIGH (ref 70–99)
Glucose-Capillary: 233 mg/dL — ABNORMAL HIGH (ref 70–99)
Glucose-Capillary: 261 mg/dL — ABNORMAL HIGH (ref 70–99)
Glucose-Capillary: 273 mg/dL — ABNORMAL HIGH (ref 70–99)

## 2020-10-01 LAB — MAGNESIUM: Magnesium: 2.4 mg/dL (ref 1.7–2.4)

## 2020-10-01 LAB — POTASSIUM: Potassium: 3.1 mmol/L — ABNORMAL LOW (ref 3.5–5.1)

## 2020-10-01 MED ORDER — POTASSIUM CHLORIDE 10 MEQ/50ML IV SOLN
10.0000 meq | INTRAVENOUS | Status: AC
  Administered 2020-10-01 – 2020-10-02 (×2): 10 meq via INTRAVENOUS
  Filled 2020-10-01 (×2): qty 50

## 2020-10-01 MED ORDER — HEPARIN SODIUM (PORCINE) 1000 UNIT/ML IJ SOLN
1400.0000 [IU] | Freq: Once | INTRAMUSCULAR | Status: AC
  Administered 2020-10-01: 1400 [IU] via INTRAVENOUS

## 2020-10-01 MED ORDER — AMIODARONE HCL IN DEXTROSE 360-4.14 MG/200ML-% IV SOLN
60.0000 mg/h | INTRAVENOUS | Status: AC
  Administered 2020-10-01 (×2): 60 mg/h via INTRAVENOUS
  Filled 2020-10-01 (×2): qty 200

## 2020-10-01 MED ORDER — DILTIAZEM HCL 25 MG/5ML IV SOLN
10.0000 mg | Freq: Once | INTRAVENOUS | Status: AC
  Administered 2020-10-01: 10 mg via INTRAVENOUS
  Filled 2020-10-01: qty 5

## 2020-10-01 MED ORDER — DILTIAZEM HCL 25 MG/5ML IV SOLN
5.0000 mg | Freq: Once | INTRAVENOUS | Status: AC
  Administered 2020-10-01: 5 mg via INTRAVENOUS
  Filled 2020-10-01: qty 5

## 2020-10-01 MED ORDER — AMIODARONE HCL IN DEXTROSE 360-4.14 MG/200ML-% IV SOLN
30.0000 mg/h | INTRAVENOUS | Status: DC
  Administered 2020-10-01 – 2020-10-03 (×5): 30 mg/h via INTRAVENOUS
  Filled 2020-10-01 (×4): qty 200

## 2020-10-01 MED ORDER — AMIODARONE LOAD VIA INFUSION
150.0000 mg | Freq: Once | INTRAVENOUS | Status: AC
  Administered 2020-10-01: 150 mg via INTRAVENOUS
  Filled 2020-10-01: qty 83.34

## 2020-10-01 NOTE — Progress Notes (Signed)
[place] INFECTIOUS DISEASE PROGRESS NOTE Date of Admission:  09/25/2020      ID: Richard Oconnell is a 65 y.o. 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  No fevers, wbc stable at 18. 18 . JP with less drainage.   ROS  Unable to obtain  Medications:  Antibiotics Given (last 72 hours)    Date/Time Action Medication Dose Rate   09/28/20 1747 New Bag/Given   piperacillin-tazobactam (ZOSYN) IVPB 2.25 g 2.25 g 100 mL/hr   09/28/20 2310 New Bag/Given   piperacillin-tazobactam (ZOSYN) IVPB 2.25 g 2.25 g 100 mL/hr   09/29/20 0510 New Bag/Given   piperacillin-tazobactam (ZOSYN) IVPB 2.25 g 2.25 g 100 mL/hr   09/29/20 1337 New Bag/Given   piperacillin-tazobactam (ZOSYN) IVPB 2.25 g 2.25 g 100 mL/hr   09/29/20 1737 New Bag/Given   piperacillin-tazobactam (ZOSYN) IVPB 2.25 g 2.25 g 100 mL/hr   09/29/20 2336 New Bag/Given   piperacillin-tazobactam (ZOSYN) IVPB 2.25 g 2.25 g 100 mL/hr   09/30/20 0516 New Bag/Given   piperacillin-tazobactam (ZOSYN) IVPB 2.25 g 2.25 g 100 mL/hr   09/30/20 1129 New Bag/Given   piperacillin-tazobactam (ZOSYN) IVPB 2.25 g 2.25 g 100 mL/hr   09/30/20 1800 New Bag/Given   piperacillin-tazobactam (ZOSYN) IVPB 2.25 g 2.25 g 100 mL/hr   10/01/20 0019 New Bag/Given   piperacillin-tazobactam (ZOSYN) IVPB 2.25 g 2.25 g 100 mL/hr   10/01/20 0502 New Bag/Given   piperacillin-tazobactam (ZOSYN) IVPB 2.25 g 2.25 g 100 mL/hr   10/01/20 1300 New Bag/Given   piperacillin-tazobactam (ZOSYN) IVPB 2.25 g 2.25 g 100 mL/hr     . chlorhexidine gluconate (MEDLINE KIT)  15 mL Mouth Rinse BID  . Chlorhexidine Gluconate Cloth  6 each Topical Daily  . docusate  100 mg Per Tube BID  . feeding supplement (PROSource TF)   45 mL Per Tube Daily  . free water  30 mL Per Tube Q4H  . heparin injection (subcutaneous)  5,000 Units Subcutaneous Q8H  . insulin aspart  0-20 Units Subcutaneous Q4H  . insulin glargine  10 Units Subcutaneous QHS  . mouth rinse  15 mL Mouth Rinse 10 times per day  . multivitamin  1 tablet Per Tube QHS  . pantoprazole (PROTONIX) IV  40 mg Intravenous Q24H  . polyethylene glycol  17 g Per Tube Daily  . sodium chloride flush  3 mL Intravenous Q12H  . sodium chloride flush  5 mL Intracatheter Q8H    Objective: Vital signs in last 24 hours: Temp:  [97.7 F (36.5 C)-99 F (37.2 C)] 98.2 F (36.8 C) (05/18 1206) Pulse Rate:  [72-117] 111 (05/18 1500) Resp:  [13-24] 23 (05/18 1500) BP: (126-169)/(70-109) 144/83 (05/18 1500) SpO2:  [92 %-100 %] 95 % (05/18 1500) Weight:  [970 kg] 114 kg (05/18 0451) Constitutional:obese awake, chronically ill appearing HENT:anicteric Mouth/Throat: OP clear Cardiovascular: Normal rate, regular rhythm and normal heart sounds. Pulmonary/Chest: Effort normal and breath sounds normal. No respiratory distress. He has no wheezes.  HD cath in palce Abdominal: Soft.JP drain RUQ with mindrainage Lymphadenopathy: He has no cervical adenopathy.  Neurological:sedatedSkin: Skin is warm and dry. No rash noted. No erythema.  Lab Results Recent Labs    09/30/20 0514 10/01/20 0340 10/01/20 0346  WBC  19.3*  --  18.9*  HGB 9.6*  --  11.3*  HCT 28.7*  --  33.4*  NA 135 136  --   K 3.5 3.2*  --   CL 94* 95*  --   CO2 29 27  --   BUN 81* 92*  --   CREATININE 5.49* 5.65*  --     Microbiology: Results for orders placed or performed during the hospital encounter of 09/25/20  Culture, blood (routine x 2)     Status: Abnormal   Collection Time: 09/25/20  2:41 PM   Specimen: BLOOD  Result Value Ref Range Status   Specimen Description   Final    BLOOD RIGHT ANTECUBITAL Performed at [facility], [address] [place], [place] [address]     Special Requests   Final    BOTTLES DRAWN AEROBIC AND ANAEROBIC Blood Culture results may not be optimal due to an excessive volume of blood received in culture bottles Performed at [facility], [address] [place], [place] [address]    Culture  Setup Time   Final    Organism ID to follow Luis Lopez CRITICAL RESULT CALLED TO, READ BACK BY AND VERIFIED WITH: [place] _0  ON 09/26/20.[place] Performed at [facility], [address] [place], [place] [address]    Culture ENTEROCOCCUS FAECALIS (A)  Final   Report Status 09/28/2020 FINAL  Final   Organism ID, Bacteria ENTEROCOCCUS FAECALIS  Final      Susceptibility   Enterococcus faecalis - MIC*    AMPICILLIN <=2 SENSITIVE Sensitive     VANCOMYCIN 1 SENSITIVE Sensitive     GENTAMICIN SYNERGY SENSITIVE Sensitive     * ENTEROCOCCUS FAECALIS  Blood Culture ID Panel (Reflexed)     Status: Abnormal   Collection Time: 09/25/20  2:41 PM  Result Value Ref Range Status   Enterococcus faecalis DETECTED (A) NOT DETECTED Final    Comment: CRITICAL RESULT CALLED TO, READ BACK BY AND VERIFIED WITH: LISA KLUTTZ _1  ON 09/26/20.SH    Enterococcus Faecium NOT DETECTED NOT DETECTED Final   Listeria monocytogenes NOT DETECTED NOT DETECTED Final   Staphylococcus species NOT DETECTED NOT DETECTED Final   Staphylococcus aureus (BCID) NOT DETECTED NOT DETECTED Final   Staphylococcus epidermidis NOT DETECTED NOT DETECTED Final   Staphylococcus lugdunensis NOT DETECTED NOT DETECTED Final   Streptococcus species NOT DETECTED NOT DETECTED Final   Streptococcus agalactiae NOT DETECTED NOT DETECTED Final   Streptococcus pneumoniae NOT DETECTED NOT DETECTED Final   Streptococcus pyogenes NOT DETECTED NOT DETECTED Final   A.calcoaceticus-baumannii NOT DETECTED NOT DETECTED Final   Bacteroides fragilis DETECTED (A) NOT DETECTED Final    Comment: CRITICAL RESULT CALLED TO, READ BACK BY AND  VERIFIED WITH: LISA KLUTTZ _2  ON 09/26/20.SH    Enterobacterales NOT DETECTED NOT DETECTED Final   Enterobacter cloacae complex NOT DETECTED NOT DETECTED Final   Escherichia coli NOT DETECTED NOT DETECTED Final   Klebsiella aerogenes NOT DETECTED NOT DETECTED Final   Klebsiella oxytoca NOT DETECTED NOT DETECTED Final   Klebsiella pneumoniae NOT DETECTED NOT DETECTED Final   Proteus species NOT DETECTED NOT DETECTED Final   Salmonella species NOT DETECTED NOT DETECTED Final   Serratia marcescens NOT DETECTED NOT DETECTED Final   Haemophilus influenzae NOT DETECTED NOT DETECTED Final   Neisseria meningitidis NOT DETECTED NOT DETECTED Final   Pseudomonas aeruginosa NOT DETECTED NOT DETECTED Final   Stenotrophomonas maltophilia NOT DETECTED NOT DETECTED  Final   Candida albicans NOT DETECTED NOT DETECTED Final   Candida auris NOT DETECTED NOT DETECTED Final   Candida glabrata NOT DETECTED NOT DETECTED Final   Candida krusei NOT DETECTED NOT DETECTED Final   Candida parapsilosis NOT DETECTED NOT DETECTED Final   Candida tropicalis NOT DETECTED NOT DETECTED Final   Cryptococcus neoformans/gattii NOT DETECTED NOT DETECTED Final   Vancomycin resistance NOT DETECTED NOT DETECTED Final    Comment: Performed at [facility], [address] [place], [place] [address]  Culture, blood (routine x 2)     Status: None   Collection Time: 09/25/20  3:42 PM   Specimen: BLOOD  Result Value Ref Range Status   Specimen Description BLOOD BLOOD LEFT HAND  Final   Special Requests   Final    BOTTLES DRAWN AEROBIC AND ANAEROBIC Blood Culture adequate volume   Culture   Final    NO GROWTH 5 DAYS Performed at [facility], [address] [place], [place] [address]    Report Status 09/30/2020 FINAL  Final  Resp Panel by RT-PCR (Flu A&B, Covid) Nasopharyngeal Swab     Status: None   Collection Time: 09/25/20  3:51 PM   Specimen: Nasopharyngeal Swab; Nasopharyngeal(NP) swabs in vial  transport medium  Result Value Ref Range Status   SARS Coronavirus 2 by RT PCR NEGATIVE NEGATIVE Final    Comment: (NOTE) SARS-CoV-2 target nucleic acids are NOT DETECTED.  The SARS-CoV-2 RNA is generally detectable in upper respiratory specimens during the acute phase of infection. The lowest concentration of SARS-CoV-2 viral copies this assay can detect is 138 copies/mL. A negative result does not preclude SARS-Cov-2 infection and should not be used as the sole basis for treatment or other patient management decisions. A negative result may occur with  improper specimen collection/handling, submission of specimen other than nasopharyngeal swab, presence of viral mutation(s) within the areas targeted by this assay, and inadequate number of viral copies(<138 copies/mL). A negative result must be combined with clinical observations, patient history, and epidemiological information. The expected result is Negative.  Fact Sheet for Patients:  EntrepreneurPulse.com.au  Fact Sheet for Healthcare Providers:  IncredibleEmployment.be  This test is no t yet approved or cleared by the [place] FDA and  has been authorized for detection and/or diagnosis of SARS-CoV-2 by FDA under an Emergency Use Authorization (EUA). This EUA will remain  in effect (meaning this test can be used) for the duration of the COVID-19 declaration under Section 564(b)(1) of the Act, 21 U.S.C.section 360bbb-3(b)(1), unless the authorization is terminated  or revoked sooner.       Influenza A by PCR NEGATIVE NEGATIVE Final   Influenza B by PCR NEGATIVE NEGATIVE Final    Comment: (NOTE) The Xpert Xpress SARS-CoV-2/FLU/RSV plus assay is intended as an aid in the diagnosis of influenza from Nasopharyngeal swab specimens and should not be used as a sole basis for treatment. Nasal washings and aspirates are unacceptable for Xpert Xpress SARS-CoV-2/FLU/RSV testing.  Fact  Sheet for Patients: EntrepreneurPulse.com.au  Fact Sheet for Healthcare Providers: IncredibleEmployment.be  This test is not yet approved or cleared by the [place] FDA and has been authorized for detection and/or diagnosis of SARS-CoV-2 by FDA under an Emergency Use Authorization (EUA). This EUA will remain in effect (meaning this test can be used) for the duration of the COVID-19 declaration under Section 564(b)(1) of the Act, 21 U.S.C. section 360bbb-3(b)(1), unless the authorization is terminated or revoked.  Performed at [facility],  [place], [place] [address]   MRSA PCR Screening     Status: None   Collection Time: 09/25/20  6:44 PM   Specimen: Nasopharyngeal  Result Value Ref Range Status   MRSA by PCR NEGATIVE NEGATIVE Final    Comment:        The GeneXpert MRSA Assay (FDA approved for NASAL specimens only), is one component of a comprehensive MRSA colonization surveillance program. It is not intended to diagnose MRSA infection nor to guide or monitor treatment for MRSA infections. Performed at [facility], [address], [place], [place] [address]   Aerobic/Anaerobic Culture (surgical/deep wound)     Status: None   Collection Time: 09/25/20  9:02 PM   Specimen: Abscess  Result Value Ref Range Status   Specimen Description   Final    ABSCESS Performed at [facility], [address], [place], [place] [address]    Special Requests   Final    Normal Performed at [place], [place] [address]    Gram Stain   Final    ABUNDANT WBC PRESENT,BOTH PMN AND MONONUCLEAR MODERATE GRAM NEGATIVE COCCOBACILLI    Culture   Final    ABUNDANT ENTEROCOCCUS FAECALIS MODERATE BACTEROIDES FRAGILIS BETA LACTAMASE POSITIVE Performed at [facility], [place] [address], [place], [place] [address]    Report Status 09/28/2020 FINAL  Final   Organism ID,  Bacteria ENTEROCOCCUS FAECALIS  Final      Susceptibility   Enterococcus faecalis - MIC*    AMPICILLIN <=2 SENSITIVE Sensitive     VANCOMYCIN 1 SENSITIVE Sensitive     GENTAMICIN SYNERGY SENSITIVE Sensitive     * ABUNDANT ENTEROCOCCUS FAECALIS  Urine culture     Status: None   Collection Time: 09/26/20  1:36 PM   Specimen: Urine, Random  Result Value Ref Range Status   Specimen Description   Final    URINE, RANDOM Performed at [facility], [address] [place], [place] [address]    Special Requests   Final    NONE Performed at [facility], [address] [place], [place] [address]    Culture   Final    NO GROWTH Performed at [facility], [place] [address], [place], [place] [address]    Report Status 09/28/2020 FINAL  Final  Culture, blood (single) w Reflex to ID Panel     Status: None (Preliminary result)   Collection Time: 09/30/20  3:35 PM   Specimen: BLOOD  Result Value Ref Range Status   Specimen Description BLOOD BLOOD RIGHT HAND  Final   Special Requests   Final    BOTTLES DRAWN AEROBIC AND ANAEROBIC Blood Culture adequate volume   Culture   Final    NO GROWTH < 24 HOURS Performed at [facility], [address], [place], [place] [address]    Report Status PENDING  Incomplete    Studies/Results: CT ABDOMEN PELVIS WO CONTRAST  Result Date: 09/30/2020 CLINICAL DATA:  Cholecystectomy 09/08/2020. Abdominal wall abscess with sepsis and bacteremia. EXAM: CT ABDOMEN AND PELVIS WITHOUT CONTRAST TECHNIQUE: Multidetector CT imaging of the abdomen and pelvis was performed following the standard protocol without IV contrast. COMPARISON:  09/25/2020 FINDINGS: Lower chest: Borderline cardiomegaly. The tip of the central line is visible in the distal SVC. Coronary and aortic atherosclerosis. Small bilateral pleural effusions with atelectasis in both lower lobes disproportionate to size of the pleural effusions. Nasogastric tube enters the  stomach. Hepatobiliary: Pigtail catheter along the gallbladder fossa. Irregular collection of gas and fluid along the gallbladder fossa measuring about 6.8 by 3.6 by 5.2 cm traversed by the pigtail catheter, the coil of the pigtail is in the posterior portion of this collection. There also small locules of gas along the adjacent peritoneum near the entry site of the pigtail catheter for example on image 41 series 2. Mild hypodensity and expansion of the adjacent upper abdominal musculature in the vicinity of the pigtail catheter. Higher density elements along the collection of gas and fluid in the gallbladder fossa on image 36 of series 2, probably from suture material, less likely spilled gallstones. Pancreas: Unremarkable Spleen: Unremarkable Adrenals/Urinary Tract: Both adrenal glands appear normal. Foley catheter in the otherwise collapsed urinary bladder. No hydronephrosis or hydroureter. Exophytic 1.9 cm lesion of the left kidney upper pole laterally with some high density elements posteriorly, complex lesion not excluded. A small mass is not excluded. Stomach/Bowel: Nasogastric tube terminates in the stomach body. Transverse, descending, and sigmoid colon diverticulosis. There is some stranding in the adipose tissue adjacent to the hepatic flexure which is likely attributable to the inflammatory process along the gallbladder fossa; a hepatic flexure injury or fistula to the gallbladder fossa region is considered less likely although not totally excluded on today's noncontrast examination. Vascular/Lymphatic: Aortoiliac atherosclerotic vascular disease. Reproductive: Unremarkable Other: Subcutaneous edema especially laterally along the hips and flanks. Scattered omental, mesenteric, and particularly left retroperitoneal edema also tracking along the left iloipsoas. Mild ascites. Presacral edema. Musculoskeletal: Nonspecific rim sclerotic lesion in the left eighth rib laterally on image 23 series 2, stable from  2018. Lower thoracic and lumbar spondylosis and degenerative disc disease contributing to multilevel substantial impingement. IMPRESSION: 1. Collection of gas and fluid in the gallbladder fossa with small locules of gas also tracking along the pigtail catheter drain along the peritoneal margin of the right anterior abdominal wall. Given that we are over 3 weeks out from cholecystectomy, this collection is abnormal and is compatible with abscess. As on prior exams, the adjacent hepatic flexure is somewhat indistinct with some spanning stranding, making it difficult to completely exclude a fistula between the gallbladder fossa region and the colon although no obvious linear collection of extraluminal gas is seen. 2. Third spacing of fluid with ascites, widespread mesenteric and subcutaneous edema. Retroperitoneal edema on the left. 3. Bibasilar atelectasis with small bilateral pleural effusions. 4.  Aortic Atherosclerosis (ICD10-I70.0).  Coronary atherosclerosis. 5. Multilevel thoracic and lumbar impingement. 6. Transverse, descending, and sigmoid colon diverticulosis. 7. Nonspecific sclerotic lesion in the left eighth rib laterally, no change from 2018, highly likely to be benign. 8. Potentially complex exophytic lesion from the left kidney upper pole, possibly a complex cyst although a small mass is not excluded. Electronically Signed   By: Van Clines M.D.   On: 09/30/2020 13:47   CT HEAD WO CONTRAST  Result Date: 09/30/2020 CLINICAL DATA:  Mental status change, unknown cause. Additional history provided: Patient extubated and still not at baseline. EXAM: CT HEAD WITHOUT CONTRAST TECHNIQUE: Contiguous axial images were obtained from the base of the skull through the vertex without intravenous contrast. COMPARISON:  Prior head CT 09/25/2020. FINDINGS: Brain: Mild cerebral atrophy. Mild patchy and ill-defined hypoattenuation within the cerebral white matter, nonspecific but compatible with chronic small  vessel ischemic disease. There is no acute intracranial hemorrhage. No demarcated cortical infarct. No extra-axial fluid collection. No evidence of intracranial mass. No midline shift. Vascular: No hyperdense vessel.  Atherosclerotic calcifications.  Skull: Normal. Negative for fracture or focal lesion. Sinuses/Orbits: Visualized orbits show no acute finding. Trace bilateral ethmoid sinus mucosal thickening. IMPRESSION: No CT evidence of acute intracranial abnormality. Stable non-contrast CT appearance of the brain as compared to 09/25/2020. Mild cerebral atrophy and chronic small vessel ischemic disease. Electronically Signed   By: Kellie Simmering DO   On: 09/30/2020 17:45    Assessment/Plan: Zykee Avakian Lindleyis a 65 y.o.maleith a history of type 2 diabetes, hypertension, hyperlipidemia, has had a complicated course since February 2022 when he was admitted with acute cholecystitis. He was treated with initially IV antibiotics and then transition to oral antibiotics. He was high risk for surgery so IR performed a percutaneous cholecystotomy on February 24. He followed up with general surgery and had lap chole 4/25.DC'd with a drain in place as well as on Augmentin for 7 days. Drain was removed May 3. He had not really been able to take good care of it at home. He now presents with sepsis, chills, abdominal pain and found to have a very large intra-abdominal abscess. He remained septic in the ICU on pressors. Cultures of blood and abscess are growing Enterococcus. 5/16 - wbc still elevated but decreasing. TTE negative 5/17 off pressors but still intubated.  CT shows continued abscess 5/18 -planning repositioning of IR placed drain tomorrow.  Remains on dialysis.  Drain with very little output. Extubated Recommendations Continue Zosyn Cont  fluconazole. IR to adjust drain tomorrow May consider TEE    Thank you very much for the consult. Will follow with you.  Leonel Ramsay   10/01/2020, 3:16 PM

## 2020-10-01 NOTE — Progress Notes (Addendum)
Brief Progress note Discussed with Dr Anselm Pancoast (IR) regarding percutaneous drain, he agrees this likely needs repositioned. We will plan for this tomorrow (05/19).   -- Hold tube feedings at midnight -- Hold heparin drip at midnight  -- Edison Simon, PA-C [facility] 10/01/2020, 10:58 AM [phone] M-F: 7am - 4pm

## 2020-10-01 NOTE — Progress Notes (Addendum)
[facility] Day(s): 6.    Interval History:  Patient seen and examined; extubated last night but with persistent AMS.  CT head performed that did not reveal any clear etiology. No acute events or new complaints overnight.  Leukocytosis overall improved, but stable compared to yesterday, at 18.9K Unfortunately continues to have elevated sCr - 5.65; UO - 1.7L Hypokalemia to 3.2 Drain with 5 ccs recorded; seropurulent Cx from drain placement growing enterococcus faecalis; continues on Zosyn Started on tube feedings yesterday (05/16); tolerating   Vital signs in last 24 hours: [min-max] current  Temp:  [97.7 F (36.5 C)-99 F (37.2 C)] 97.9 F (36.6 C) (05/18 0700) Pulse Rate:  [58-109] 101 (05/18 0700) Resp:  [10-22] 18 (05/18 0700) BP: (91-158)/(58-92) 158/89 (05/18 0700) SpO2:  [92 %-100 %] 94 % (05/18 0700) FiO2 (%):  [28 %] 28 % (05/17 1423) Weight:  [782 kg] 114 kg (05/18 0451)     Height: 6' 3.98" (193 cm) Weight: 114 kg BMI (Calculated): 30.6   Intake/Output last 2 shifts:  05/17 0701 - 05/18 0700 In: 1107.4 [I.V.:108.9; NG/GT:588.3; IV Piggyback:400.2] Out: 9562 [Urine:1750; Drains:5]   Physical Exam:  Constitutional: opens eyes to voice; incoherent vocalizations Respiratory: breathing non-labored at rest  Cardiovascular: regular rate and sinus rhythm  Gastrointestinal: Soft, non-tender, and non-distended, no rebound/guarding. percutaneous drain in the RUQ with seropurulent output Genitourinary: Foley in place Integumentary: warm, dry  Labs:  CBC Latest Ref Rng & Units 10/01/2020 09/30/2020 09/29/2020  WBC 4.0 - 10.5 K/uL 18.9(H) 19.3(H) 27.0(H)  Hemoglobin 13.0 - 17.0 g/dL 11.3(L) 9.6(L) 10.5(L)  Hematocrit 39.0 - 52.0 % 33.4(L) 28.7(L) 30.1(L)  Platelets 150 - 400 K/uL 381 373 475(H)   CMP Latest Ref Rng & Units 10/01/2020 09/30/2020 09/29/2020  Glucose 70 - 99 mg/dL 209(H) 148(H) 228(H)  BUN 8 - 23 mg/dL 92(H) 81(H)  86(H)  Creatinine 0.61 - 1.24 mg/dL 5.65(H) 5.49(H) 6.13(H)  Sodium 135 - 145 mmol/L 136 135 131(L)  Potassium 3.5 - 5.1 mmol/L 3.2(L) 3.5 3.8  Chloride 98 - 111 mmol/L 95(L) 94(L) 87(L)  CO2 22 - 32 mmol/L 27 29 28   Calcium 8.9 - 10.3 mg/dL 7.1(L) 7.0(L) 6.7(L)  Total Protein 6.5 - 8.1 g/dL - - 5.8(L)  Total Bilirubin 0.3 - 1.2 mg/dL - - 2.2(H)  Alkaline Phos 38 - 126 U/L - - 213(H)  AST 15 - 41 U/L - - 55(H)  ALT 0 - 44 U/L - - 42     Imaging studies:   CT Abdomen/Pelvis (09/30/2020) personally reviewed which still shows gas/fluid collection in the gallbladder fossa, percutaneous drain in place (? If this needs exchanged or upsized), and radiologist report reviewed below IMPRESSION: 1. Collection of gas and fluid in the gallbladder fossa with small locules of gas also tracking along the pigtail catheter drain along the peritoneal margin of the right anterior abdominal wall. Given that we are over 3 weeks out from cholecystectomy, this collection is abnormal and is compatible with abscess. As on prior exams, the adjacent hepatic flexure is somewhat indistinct with some spanning stranding, making it difficult to completely exclude a fistula between the gallbladder fossa region and the colon although no obvious linear collection of extraluminal gas is seen. 2. Third spacing of fluid with ascites, widespread mesenteric and subcutaneous edema. Retroperitoneal edema on the left. 3. Bibasilar atelectasis with small bilateral pleural effusions. 4.  Aortic Atherosclerosis (ICD10-I70.0).  Coronary atherosclerosis. 5. Multilevel thoracic and lumbar impingement. 6. Transverse, descending,  and sigmoid colon diverticulosis. 7. Nonspecific sclerotic lesion in the left eighth rib laterally, no change from 2018, highly likely to be benign. 8. Potentially complex exophytic lesion from the left kidney upper pole, possibly a complex cyst although a small mass is not excluded.  Assessment/Plan:   65 y.o. male with severe sepsis, AKI, and encephalopathy secondary to gallbladder fossa abscess 22 days s/p robotic assisted laparoscopic cholecystectomy for severe cholecystitis   - Recommend reviewing CT with IR today to consider if drain up-sizing or adjusting is warranted given the persistent air/fluid collection seen on CT yesterday   - Okay to continue use of NGT for enteric feeding if needed; continue to monitor mental status prior to initiating oral feeding.             - Continue IV Abx (Zsoyn); Cx with enterococcus faecalis; sensitivities reviewed             - Appreciate nephrology assistance             - Monitor leukocytosis; improving             - Monitor renal function; elevated but stable             - Further management per primary service; we will follow     All of the above findings and recommendations were discussed with the patient, and the medical team, and all of patient's questions were answered to his expressed satisfaction.  -- Edison Simon, PA-C [facility] 10/01/2020, 7:38 AM [phone] M-F: 7am - 4pm   I saw and evaluated the patient.  I agree with the above documentation, exam, and plan, which I have edited where appropriate. [name]  10:22 AM

## 2020-10-01 NOTE — TOC Progression Note (Signed)
Transition of Care [facility]) - Progression Note    Patient Details  Name: Richard Oconnell MRN: [id number] Date of Birth: [birth year]  [facility]) CM/SW Craig, RN Phone Number: 10/01/2020, 11:05 AM  Clinical Narrative: Spoke with family in the waiting room, Joe-Brother of patient and Sister Ione. Discussed the possiblility of L-TAC, long term care for patient. Given facilities locations, 2-[place], 1-[place] and 1-[place] and the purpose and process of Attending to determine based on patients condition. The L-TAC will need to assess and the Insurance will need to provide Authorization. Family members voices understanding of process and will discuss further with Attending. Family informed that TOC will update when needed. TOC to discuss L-TAC with Attending.    Expected Discharge Plan: [place] Barriers to Discharge: Continued Medical Work up  Expected Discharge Plan and Services Expected Discharge Plan: [place] In-house Referral: Clinical Social Work   Post Acute Care Choice: [place] Living arrangements for the past 2 months: Single Family Home                                       Social Determinants of Health (SDOH) Interventions    Readmission Risk Interventions No flowsheet data found.

## 2020-10-01 NOTE — Progress Notes (Addendum)
50 Ml ns flush given via arterial line to prevent clotting in arterial chamber of dialysis circuit. 61M volume added to UF goal. UF goal now 561Ml

## 2020-10-01 NOTE — Progress Notes (Signed)
NAME:  Richard Oconnell, MRN:  [id number], DOB:  Sep 15, 1955, LOS: 6 ADMISSION DATE:  09/25/2020 REFERRING MD:  Charna Archer CHIEF COMPLAINT:  Feeling bad and belly pain  Brief Pt Description/Synopsis:  65 yo male with previous dx of acute cholecystitis s/p cholecystotomy tube 2/24 and then follow up robotic assisted cholecystectomy 4/25, now presents with large abdominal abscess with severe septic shock and BACTEREMIA, with multiorgan failure (liver and renal failure) complicated by severe metabolic acidosis and toxic metabolic encephalopathy.  History of Present Illness:  66 year old man past medical history of hypertension, diabetes, and bladder cancer who was hospitalized with acute cholecystitis-several months ago 2/24 He was temporized with a percutaneous cholecystostomy tube.    Follow up  cholangiogram via the tube demonstrates that the cystic duct is still obstructed He was offered him a robot-assisted laparoscopic cholecystectomy, but  deferred this procedure  On 4/25 recently had cholecystectomy along with significant lysis of adhesions on 4/25 here at [facility].  He presents today with somnolence and confusion.   Family reports that patient is disoriented the day before yesterday, after which his skin has begun to appear increasingly yellow.    +discomfort in the right upper quadrant of his abdomen, liver failure, renal failure, severe acidosis Patient started on pressors, admitted for septic shock  WBC 25 LA 6.5-->8.7 CREAT 4.8 TB 9.5   Pertinent  Medical History  2/24 admitted for acute cholecystitis,percutaneous cholecystostomy tube placed by IR  3/29 offered  a robot-assisted laparoscopic cholecystectomy, but deferred  4/25 s/p cholecystectomy along with significant lysis of adhesions on 4/25 here at [facility].   Significant Hospital Events: Including procedures, antibiotic start and stop dates in addition to other pertinent events   2/24 admitted for acute  cholecystitis,percutaneous cholecystostomy tube placed by IR  3/29 offered  a robot-assisted laparoscopic cholecystectomy, but deferred  4/25 s/p cholecystectomy along with significant lysis of adhesions on 4/25 here at [facility]. 5/12 admission to ICU for large abd abscess, jaundice and septic shock, on pressors 5/12 CT abd-Large abscess in the gallbladder fossa, at least 10 cm in size; Percutaneous drain placed by IR 5/13: Nephrology following, starting Bicarb gtt, remains on Levophed; Blood cultures growing Enterococcus faecalis,Bacteroides fragilis.  Pt extremely delirious and agitated, trying to leave to go home, interfering with medical treatment, plan to start Precedex. High risk for intubation due to severe Encephalopathy 5/14severe sepsis, high risk for intubation, on precedex,  5/14 INTUBATED AND LEFT TRIALYSIS CATHETER PLACED 5/16: Plan for SBT following Hemodialysis.  Add Fluconazole for intraabdominal coverage 5/17: Repeat CT Abdomen/Pelvis @ 09:00.  Plan for SBT and hopeful for extubation as pt following commands this morning. Levophed currently weaned off 5/18: Extubated yesterday, now on room air.  Remains encephalopathic, repeat CT head negative yesterday.  To get Hemodialysis today.   Micro Data:  09/25/2020: SARS-CoV-2 PCR>> negative 09/25/2020: Influenza PCR>> negative 09/25/2020: Blood culture x2>>Enterococcus faecalis,Bacteroides fragilis 09/25/2020: MRSA PCR>> negative 09/25/2020: Aerobic/anaerobic culture from abdominal abscess>> ENTEROCOCCUS FAECALIS, BACTEROIDES FRAGILIS, BETA LACTAMASE POSITIVE (sensitive to Ampicillin, Gentamycin, Vancomycin) 09/26/2020: Urine>>no growth 09/30/2020: Blood culture>>  Antimicrobials:  Zosyn 5/12>> Fluconazole 5/16>>   Antibiotics Given (last 72 hours)    Date/Time Action Medication Dose Rate   09/28/20 1747 New Bag/Given   piperacillin-tazobactam (ZOSYN) IVPB 2.25 g 2.25 g 100 mL/hr   09/28/20 2310 New Bag/Given    piperacillin-tazobactam (ZOSYN) IVPB 2.25 g 2.25 g 100 mL/hr   09/29/20 0510 New Bag/Given   piperacillin-tazobactam (ZOSYN) IVPB 2.25 g 2.25 g 100 mL/hr  09/29/20 1337 New Bag/Given   piperacillin-tazobactam (ZOSYN) IVPB 2.25 g 2.25 g 100 mL/hr   09/29/20 1737 New Bag/Given   piperacillin-tazobactam (ZOSYN) IVPB 2.25 g 2.25 g 100 mL/hr   09/29/20 2336 New Bag/Given   piperacillin-tazobactam (ZOSYN) IVPB 2.25 g 2.25 g 100 mL/hr   09/30/20 0516 New Bag/Given   piperacillin-tazobactam (ZOSYN) IVPB 2.25 g 2.25 g 100 mL/hr   09/30/20 1129 New Bag/Given   piperacillin-tazobactam (ZOSYN) IVPB 2.25 g 2.25 g 100 mL/hr   09/30/20 1800 New Bag/Given   piperacillin-tazobactam (ZOSYN) IVPB 2.25 g 2.25 g 100 mL/hr   10/01/20 0019 New Bag/Given   piperacillin-tazobactam (ZOSYN) IVPB 2.25 g 2.25 g 100 mL/hr   10/01/20 0502 New Bag/Given   piperacillin-tazobactam (ZOSYN) IVPB 2.25 g 2.25 g 100 mL/hr      Interval History:  -Extubated yesterday, now on room air, protecting airway -No acute events reported overnight -Afebrile, hemodynamically stable, no vasopressors -Urine output 1.75L yesterday (+10 L since admit) -Remains Encephalopathic, generalized weakness; CT Head yesterday evening negative for acute intracranial abnormality -To get Hemodialysis today -Surgery to discuss with IR as to whether drain up-sizing or adjusting is needed given persistent air/fluid collection on repeat CT Abdomen yesterday   Objective   Blood pressure (!) 158/89, pulse (!) 101, temperature 97.9 F (36.6 C), temperature source Axillary, resp. rate 18, height 6' 3.98" (1.93 m), weight 114 kg, SpO2 94 %.    Vent Mode: PSV FiO2 (%):  [28 %] 28 % Set Rate:  [16 bmp] 16 bmp Vt Set:  [500 mL] 500 mL PEEP:  [5 cmH20] 5 cmH20 Pressure Support:  [5 cmH20] 5 cmH20   Intake/Output Summary (Last 24 hours) at 10/01/2020 0755 Last data filed at 10/01/2020 0600 Gross per 24 hour  Intake 1107.37 ml  Output 1755 ml  Net  -647.63 ml   Filed Weights   09/29/20 0500 09/30/20 0500 10/01/20 0451  Weight: 119.3 kg 118.3 kg 114 kg     REVIEW OF SYSTEMS Unable to assess due to altered mental status   PHYSICAL EXAMINATION: GENERAL:acutely ill appearing made, laying in bed, on room air, in NAD HEAD: Normocephalic, atraumatic.  EYES: Pupils equal, round, reactive to light.  No scleral icterus.  MOUTH: MM dry NECK: Supple. No thyromegaly PULMONARY: Clear diminished breath sounds bilaterally, no wheezing or rales, even, nonlabored CARDIOVASCULAR: S1 and S2. Regular rate and rhythm. No murmurs, rubs, or gallops. 2+ radial & DP pulses bilaterally GASTROINTESTINAL: Obese, nondistended, nontender, no guarding or rebound tenderness.  Percutaneous drain to RUQ is clean dry and intact   MUSCULOSKELETAL: No deformities.  Generalized weakness. No swelling, clubbing, or edema.  NEUROLOGIC: Awake.  Oriented to self only.  Moves all extremities to command, but generalized weakness SKIN: Warm and dry.  No obvious rashes, lesions, or ulcerations   Labs/imaging that I havepersonally reviewed  (right click and "Reselect all SmartList Selections" daily)  Labs 10/01/2020: K 3.2, glucose 209, BUN 92, Cr. 5.65, albumin 2, WBC 18.9, Hgb 11.3, HCT 33.4 CT Abdomen & Pelvis 5/17>>1. Collection of gas and fluid in the gallbladder fossa with small locules of gas also tracking along the pigtail catheter drain along the peritoneal margin of the right anterior abdominal wall. Given that we are over 3 weeks out from cholecystectomy, this collection is abnormal and is compatible with abscess. As on prior exams, the adjacent hepatic flexure is somewhat indistinct with some spanning stranding, making it difficult to completely exclude a fistula between the gallbladder fossa region and the  colon although no obvious linear collection of extraluminal gas is seen. 2. Third spacing of fluid with ascites, widespread mesenteric and subcutaneous  edema. Retroperitoneal edema on the left. 3. Bibasilar atelectasis with small bilateral pleural effusions. 4.  Aortic Atherosclerosis (ICD10-I70.0).  Coronary atherosclerosis. 5. Multilevel thoracic and lumbar impingement. 6. Transverse, descending, and sigmoid colon diverticulosis. 7. Nonspecific sclerotic lesion in the left eighth rib laterally, no change from 2018, highly likely to be benign. 8. Potentially complex exophytic lesion from the left kidney upper pole, possibly a complex cyst although a small mass is not excluded. CT Head w/o contrast 5/17>>No CT evidence of acute intracranial abnormality. Stable non-contrast CT appearance of the brain as compared to 09/25/2020.Mild cerebral atrophy and chronic small vessel ischemic disease.  ASSESSMENT/PLAN   Acute Hypoxic Respiratory Failure in the setting of severe metabolic derangements due to septic shock, AKI, and Metabolic Encephalopathy -Extubated 09/30/20 -Supplemental O2 as needed to maintain O2 sats >92% -Follow intermittent CXR & ABG as needed -Prn Bronchodilators -Pulmonary toilet  Septic Shock>>resolved -Continuous cardiac monitoring -Maintain MAP greater than 65 -Vasopressors weaned off -Stress dose steroids (weaned to  50 mg q12 on 09/29/20) -Will d/c Midodrine -Echocardiogram 09/27/20 with LVEF 60-65%, grade II diastolic dysfunction, RV systolic function normal; normal valves & no vegetations  Severe sepsis due to intra-abdominal abscess and BACTEREMIA (Enterococcus faecalis, Bacteroides fragilis) -Monitor fever curve -Trend WBCs and procalcitonin -Follow cultures as above -Continue Zosyn and Fluconazole -ID following, appreciate input, will defer ABX to ID -Status post Percutaneous drain placement by IR into gallbladder fossa abscess on 09/25/2020 for source control -General surgery following, appreciate input -General Surgery plans to discuss with IR if drain up-sizing or adjusting is needed given the persistent  air/fluid collection seen on repeat CT abdomen 09/30/20  Acute Kidney Injury Anion Gap Metabolic Acidosis -Monitor I&O's / urinary output -Follow BMP -Ensure adequate renal perfusion -Avoid nephrotoxic agents as able -Replace electrolytes as indicated -Nephrology following, appreciate input -HD as per Nephrology  Acute toxic metabolic encephalopathy, suspect in setting of Sepsis, AKI, and sleep deprivation -Provide supportive care -Promote normal sleep/wake cycle -Avoid sedating meds as able -CT Head 09/30/20 negative -Urine drug screen + for Benzodiazepines  Anemia without s/sx of overt blood loss -Monitor for S/Sx of bleeding -Trend CBC -SCD's for VTE Prophylaxis  -Transfuse for Hgb <7  Hyperglycemia -CBG's -SSI -Follow ICU Hypo/Hyperglycemia protocol.   Pt is critically ill with multiorgan failure.  Prognosis is guarded. High risk for cardiac arrest and death.   Best practice (right click and "Reselect all SmartList Selections" daily)  Diet: NPO, tube feeds Pain/Anxiety/Delirium protocol (if indicated): N/A VAP protocol (if indicated): N/A DVT prophylaxis: Heparin SQ GI prophylaxis: PPI Glucose control:  SSI, Lantus Central venous access:  Yes, and still needed Arterial line:  N/A Foley:  Yes, and still needed Mobility:  OOB as tolerated PT consulted: N/A Code Status:  full code Disposition: ICU   Labs   CBC: Recent Labs  Lab 09/25/20 1444 09/26/20 0420 09/27/20 0416 09/28/20 0418 09/29/20 0456 09/30/20 0514 10/01/20 0346  WBC 25.5*   < > 29.2* 36.9* 27.0* 19.3* 18.9*  NEUTROABS 24.0*  --  26.0* 32.5* 23.2*  --   --   HGB 11.5*   < > 10.7* 11.5* 10.5* 9.6* 11.3*  HCT 34.1*   < > 30.7* 33.4* 30.1* 28.7* 33.4*  MCV 80.4   < > 77.9* 79.1* 78.0* 79.5* 79.7*  PLT 535*   < > 465* 562* 475* 373 381   < > =  values in this interval not displayed.    Basic Metabolic Panel: Recent Labs  Lab 09/27/20 0416 09/28/20 0418 09/29/20 0456 09/30/20 0514  10/01/20 0340 10/01/20 0346  NA 131* 134* 131* 135 136  --   K 4.6 4.3 3.8 3.5 3.2*  --   CL 95* 93* 87* 94* 95*  --   CO2 19* 25 28 29 27   --   GLUCOSE 241* 230* 228* 148* 209*  --   BUN 74* 74* 86* 81* 92*  --   CREATININE 5.42* 5.42* 6.13* 5.49* 5.65*  --   CALCIUM 6.8* 7.1* 6.7* 7.0* 7.1*  --   MG 2.5* 2.4 2.5* 2.4  --  2.4  PHOS 8.4* 8.8* 8.6* 6.8* 6.6*  --    GFR: Estimated Creatinine Clearance: 18 mL/min (A) (by C-G formula based on SCr of 5.65 mg/dL (H)). Recent Labs  Lab 09/25/20 1502 09/25/20 1905 09/25/20 2151 09/26/20 0420 09/27/20 0416 09/28/20 0418 09/29/20 0456 09/30/20 0514 10/01/20 0346  PROCALCITON  --   --   --  148.71 99.94 56.30  --   --   --   WBC  --   --   --  45.5* 29.2* 36.9* 27.0* 19.3* 18.9*  LATICACIDVEN 8.7*  6.5* 4.4* 3.1*  --   --   --   --   --   --     Liver Function Tests: Recent Labs  Lab 09/25/20 1444 09/26/20 0420 09/27/20 0416 09/28/20 0418 09/29/20 0456 09/30/20 0514 10/01/20 0340  AST 121* 133* 100* 91* 55*  --   --   ALT 56* 56* 48* 53* 42  --   --   ALKPHOS 459* 360* 279* 255* 213*  --   --   BILITOT 9.5* 6.7* 3.9* 2.9* 2.2*  --   --   PROT 8.2* 7.6 6.8 6.3* 5.8*  --   --   ALBUMIN 2.6* 2.4* 2.0* 1.9* 1.8* 1.9* 2.0*   No results for input(s): LIPASE, AMYLASE in the last 168 hours. Recent Labs  Lab 09/25/20 1502 09/30/20 1751  AMMONIA 19 20    ABG    Component Value Date/Time   PHART 7.35 09/27/2020 1155   PCO2ART 42 09/27/2020 1155   PO2ART 71 (L) 09/27/2020 1155   HCO3 23.2 09/27/2020 1155   ACIDBASEDEF 2.3 (H) 09/27/2020 1155   O2SAT 93.1 09/27/2020 1155     Coagulation Profile: Recent Labs  Lab 09/25/20 1444  INR 1.5*    Cardiac Enzymes: No results for input(s): CKTOTAL, CKMB, CKMBINDEX, TROPONINI in the last 168 hours.  HbA1C: Hemoglobin A1C  Date/Time Value Ref Range Status  12/27/2019 12:00 AM 6.9  Final    Comment:    [facility]   Hgb A1c MFr Bld  Date/Time Value Ref Range  Status  09/26/2020 04:20 AM 6.7 (H) 4.8 - 5.6 % Final    Comment:    (NOTE) Pre diabetes:          5.7%-6.4%  Diabetes:              >6.4%  Glycemic control for   <7.0% adults with diabetes   05/30/2020 10:47 AM 7.2 (H) <5.7 % of total Hgb Final    Comment:    For someone without known diabetes, a hemoglobin A1c value of 6.5% or greater indicates that they may have  diabetes and this should be confirmed with a follow-up  test. . For someone with known diabetes, a value <7% indicates  that their diabetes is  well controlled and a value  greater than or equal to 7% indicates suboptimal  control. A1c targets should be individualized based on  duration of diabetes, age, comorbid conditions, and  other considerations. . Currently, no consensus exists regarding use of hemoglobin A1c for diagnosis of diabetes for children. .     CBG: Recent Labs  Lab 09/30/20 1506 09/30/20 1934 10/01/20 0018 10/01/20 0345 10/01/20 0719  GLUCAP 171* 135* 164* 206* 261*     Past Medical History:  He,  has a past medical history of Cancer ([place]), Erectile dysfunction associated with type 2 diabetes mellitus ([place]) (05/26/2016), Gross hematuria (11/16/2016), Hypertension (06/11/2015), and Type 2 diabetes mellitus with diabetic polyneuropathy, without long-term current use of insulin ([place]) (06/11/2015).   Surgical History:   Past Surgical History:  Procedure Laterality Date  . boil lanceted     hand  . COLONOSCOPY WITH PROPOFOL N/A 07/03/2020   Procedure: COLONOSCOPY WITH PROPOFOL;  Surgeon: Jonathon Bellows, MD;  Location: [facility] ENDOSCOPY;  Service: Gastroenterology;  Laterality: N/A;  . CYSTOSCOPY W/ RETROGRADES Bilateral 12/06/2016   Procedure: CYSTOSCOPY WITH RETROGRADE PYELOGRAM;  Surgeon: Hollice Espy, MD;  Location: [facility] ORS;  Service: Urology;  Laterality: Bilateral;  . CYSTOSCOPY W/ RETROGRADES Bilateral 06/27/2017   Procedure: CYSTOSCOPY WITH RETROGRADE PYELOGRAM;  Surgeon: Hollice Espy,  MD;  Location: [facility] ORS;  Service: Urology;  Laterality: Bilateral;  . CYSTOSCOPY WITH BIOPSY N/A 06/27/2017   Procedure: CYSTOSCOPY WITH Bladder BIOPSY;  Surgeon: Hollice Espy, MD;  Location: [facility] ORS;  Service: Urology;  Laterality: N/A;  . CYSTOSCOPY WITH BIOPSY N/A 10/24/2017   Procedure: CYSTOSCOPY WITH Bladder BIOPSY;  Surgeon: Hollice Espy, MD;  Location: [facility] ORS;  Service: Urology;  Laterality: N/A;  . ingrown  Bilateral    ingrown toenail  . IR BILIARY DRAIN PLACEMENT WITH CHOLANGIOGRAM  07/10/2020  . IR CHOLANGIOGRAM EXISTING TUBE  08/07/2020  . IR RADIOLOGIST EVAL & MGMT  08/07/2020  . TRANSURETHRAL RESECTION OF BLADDER TUMOR N/A 12/06/2016   Procedure: TRANSURETHRAL RESECTION OF BLADDER TUMOR (TURBT) (2-5cm) CLOT EVACUATION;  Surgeon: Hollice Espy, MD;  Location: [facility] ORS;  Service: Urology;  Laterality: N/A;  . TRANSURETHRAL RESECTION OF BLADDER TUMOR N/A 06/27/2017   Procedure: TRANSURETHRAL RESECTION OF BLADDER TUMOR (TURBT);  Surgeon: Hollice Espy, MD;  Location: [facility] ORS;  Service: Urology;  Laterality: N/A;  . TRANSURETHRAL RESECTION OF BLADDER TUMOR WITH MITOMYCIN-C N/A 01/18/2017   Procedure: TRANSURETHRAL RESECTION OF BLADDER TUMOR WITH MITOMYCIN-C-(SMALL);  Surgeon: Hollice Espy, MD;  Location: [facility] ORS;  Service: Urology;  Laterality: N/A;     Social History:   reports that he has never smoked. He has never used smokeless tobacco. He reports that he does not drink alcohol and does not use drugs.   Family History:  His family history includes Heart attack in his maternal aunt, maternal grandfather, maternal grandmother, and mother; Heart disease in his maternal grandfather and mother; Parkinson's disease in his father. There is no history of Prostate cancer, Bladder Cancer, or Kidney cancer.   Allergies Allergies  Allergen Reactions  . Cyclobenzaprine Other (See Comments)    Not improving pain and makes him agressive.  . Lisinopril Cough     Home  Medications  Prior to Admission medications   Medication Sig Start Date End Date Taking? Authorizing Provider  ibuprofen (ADVIL) 400 MG tablet Take 1 tablet (400 mg total) by mouth every 6 (six) hours as needed. 09/09/20  Yes Edison Simon R, PA-C  metoprolol tartrate (LOPRESSOR) 25 MG tablet Take 0.5  tablets (12.5 mg total) by mouth 2 (two) times daily. 09/19/20  Yes Karamalegos, Devonne Doughty, DO  oxyCODONE (OXY IR/ROXICODONE) 5 MG immediate release tablet Take 1 tablet (5 mg total) by mouth every 6 (six) hours as needed for severe pain or breakthrough pain. 09/09/20  Yes Edison Simon R, PA-C  pravastatin (PRAVACHOL) 20 MG tablet Take 1 tablet (20 mg total) by mouth daily. 05/30/20  Yes Karamalegos, Devonne Doughty, DO  zolpidem (AMBIEN) 5 MG tablet Take 1 tablet (5 mg total) by mouth at bedtime as needed for sleep. 08/19/20  Yes Karamalegos, Devonne Doughty, DO  acetaminophen (TYLENOL) 325 MG tablet Take 1 tablet (325 mg total) by mouth every 6 (six) hours as needed for mild pain, fever or headache (or Fever >/= 101). Patient not taking: No sig reported 07/12/20   Cherene Altes, MD  diphenhydramine-acetaminophen (TYLENOL PM) 25-500 MG TABS tablet Take 1 tablet by mouth at bedtime as needed. Patient not taking: No sig reported    [provider]  OZEMPIC, 0.25 OR 0.5 MG/DOSE, 2 MG/1.5ML SOPN Inject 1 mg into the skin once a week. Patient taking differently: Inject 1 mg into the skin every Thursday. 05/30/20   [name], DO     Critical Care Time: 32 minutes    Darel Hong, AGACNP-BC [facility] Prefer epic messenger for cross cover needs If after hours, please call E-link

## 2020-10-01 NOTE — Progress Notes (Signed)
Inpatient Diabetes Program Recommendations  AACE/ADA: New Consensus Statement on Inpatient Glycemic Control   Target Ranges:  Prepandial:   less than 140 mg/dL      Peak postprandial:   less than 180 mg/dL (1-2 hours)      Critically ill patients:  140 - 180 mg/dL   Results for SELSO, MANNOR (MRN [id number]) as of 10/01/2020 11:49  Ref. Range 09/30/2020 07:15 09/30/2020 11:54 09/30/2020 15:06 09/30/2020 19:34 10/01/2020 00:18 10/01/2020 03:45 10/01/2020 07:19 10/01/2020 11:11  Glucose-Capillary Latest Ref Range: 70 - 99 mg/dL 174 (H) 214 (H) 171 (H) 135 (H) 164 (H) 206 (H) 261 (H) 182 (H)   Review of Glycemic Control  Diabetes history: DM2 Outpatient Diabetes medications: Ozempic 1 mg Qweek (Thursday) Current orders for Inpatient glycemic control: Lantus 10 units QHS, Novolog 0-20 units Q4H; Vital @ 75 ml/hr  Inpatient Diabetes Program Recommendations:    Insulin-Tube Feeding: Please consider ordering Novolog 4 units Q4H for tube feeding coverage. If tube feeding is stopped or held then Novolog tube feeding coverage should also be stopped or held.  Thanks, Barnie Alderman, RN, MSN, CDE Diabetes Coordinator Inpatient Diabetes Program [phone] (Team Pager from 8am to 5pm)

## 2020-10-01 NOTE — Progress Notes (Signed)
[facility], [place] 10/01/20  Subjective:   Hospital day # 6  Patient is critically ill  Renal: 05/17 0701 - 05/18 0700 In: 1107.4 [I.V.:108.9; NG/GT:588.3; IV Piggyback:400.2] Out: 1755 [Urine:1750; Drains:5] Lab Results  Component Value Date   CREATININE 5.65 (H) 10/01/2020   CREATININE 5.49 (H) 09/30/2020   CREATININE 6.13 (H) 09/29/2020   Patient is now extubated Seen during HD   HEMODIALYSIS FLOWSHEET:  Blood Flow Rate (mL/min): 250 mL/min Arterial Pressure (mmHg): -120 mmHg Venous Pressure (mmHg): 90 mmHg Transmembrane Pressure (mmHg): 40 mmHg Ultrafiltration Rate (mL/min): 150 mL/min Dialysate Flow Rate (mL/min): 500 ml/min Conductivity: Machine : 14.2 Conductivity: Machine : 14.2 Dialysis Fluid Bolus: Normal Saline Bolus Amount (mL): 250 mL Dialysate Change:  (3k)    Objective:  Vital signs in last 24 hours:  Temp:  [97.7 F (36.5 C)-99 F (37.2 C)] 97.8 F (36.6 C) (05/18 0906) Pulse Rate:  [58-109] 94 (05/18 0930) Resp:  [10-24] 24 (05/18 0930) BP: (95-167)/(58-98) 159/98 (05/18 0930) SpO2:  [92 %-100 %] 97 % (05/18 0900) FiO2 (%):  [28 %] 28 % (05/17 1423) Weight:  [350 kg] 114 kg (05/18 0451)  Weight change: -4.3 kg Filed Weights   09/29/20 0500 09/30/20 0500 10/01/20 0451  Weight: 119.3 kg 118.3 kg 114 kg    Intake/Output:    Intake/Output Summary (Last 24 hours) at 10/01/2020 0935 Last data filed at 10/01/2020 0600 Gross per 24 hour  Intake 1039.52 ml  Output 1755 ml  Net -715.48 ml    Physical Exam: General:  No acute distress, laying in the bed  HEENT  NGT in place  Pulm/lungs  Coarse breath sounds   CVS/Heart  regular rhythm, no rub or gallop  Abdomen:   Soft, nontender, gall bladder drain in place  Extremities:  No peripheral edema  Neurologic:  sedated  Skin:  No acute rashes   foley in place Left IJ tempo cath 09/27/20- ICU team  Basic Metabolic Panel:  Recent Labs  Lab 09/27/20 0416  09/28/20 0418 09/29/20 0456 09/30/20 0514 10/01/20 0340 10/01/20 0346  NA 131* 134* 131* 135 136  --   K 4.6 4.3 3.8 3.5 3.2*  --   CL 95* 93* 87* 94* 95*  --   CO2 19* _0 --   GLUCOSE 241* 230* 228* 148* 209*  --   BUN 74* 74* 86* 81* 92*  --   CREATININE 5.42* 5.42* 6.13* 5.49* 5.65*  --   CALCIUM 6.8* 7.1* 6.7* 7.0* 7.1*  --   MG 2.5* 2.4 2.5* 2.4  --  2.4  PHOS 8.4* 8.8* 8.6* 6.8* 6.6*  --      CBC: Recent Labs  Lab 09/25/20 1444 09/26/20 0420 09/27/20 0416 09/28/20 0418 09/29/20 0456 09/30/20 0514 10/01/20 0346  WBC 25.5*   < > 29.2* 36.9* 27.0* 19.3* 18.9*  NEUTROABS 24.0*  --  26.0* 32.5* 23.2*  --   --   HGB 11.5*   < > 10.7* 11.5* 10.5* 9.6* 11.3*  HCT 34.1*   < > 30.7* 33.4* 30.1* 28.7* 33.4*  MCV 80.4   < > 77.9* 79.1* 78.0* 79.5* 79.7*  PLT 535*   < > 465* 562* 475* 373 381   < > = values in this interval not displayed.      Lab Results  Component Value Date   HEPBSAG NON REACTIVE 09/26/2020   HEPBSAB NON REACTIVE 09/26/2020   HEPBIGM NON REACTIVE 09/26/2020  Microbiology:  Recent Results (from the past 240 hour(s))  Culture, blood (routine x 2)     Status: Abnormal   Collection Time: 09/25/20  2:41 PM   Specimen: BLOOD  Result Value Ref Range Status   Specimen Description   Final    BLOOD RIGHT ANTECUBITAL Performed at [facility], [address] [place], [place] [address]    Special Requests   Final    BOTTLES DRAWN AEROBIC AND ANAEROBIC Blood Culture results may not be optimal due to an excessive volume of blood received in culture bottles Performed at [facility], [address] [place], [place] [address]    Culture  Setup Time   Final    Organism ID to follow [place] CRITICAL RESULT CALLED TO, READ BACK BY AND VERIFIED WITH: LISA KLUTTZ _0  ON 09/26/20.[place] Performed at [facility], [address] [place], [place] [address]     Culture ENTEROCOCCUS FAECALIS (A)  Final   Report Status 09/28/2020 FINAL  Final   Organism ID, Bacteria ENTEROCOCCUS FAECALIS  Final      Susceptibility   Enterococcus faecalis - MIC*    AMPICILLIN <=2 SENSITIVE Sensitive     VANCOMYCIN 1 SENSITIVE Sensitive     GENTAMICIN SYNERGY SENSITIVE Sensitive     * ENTEROCOCCUS FAECALIS  Blood Culture ID Panel (Reflexed)     Status: Abnormal   Collection Time: 09/25/20  2:41 PM  Result Value Ref Range Status   Enterococcus faecalis DETECTED (A) NOT DETECTED Final    Comment: CRITICAL RESULT CALLED TO, READ BACK BY AND VERIFIED WITH: LISA KLUTTZ _1  ON 09/26/20.SH    Enterococcus Faecium NOT DETECTED NOT DETECTED Final   Listeria monocytogenes NOT DETECTED NOT DETECTED Final   Staphylococcus species NOT DETECTED NOT DETECTED Final   Staphylococcus aureus (BCID) NOT DETECTED NOT DETECTED Final   Staphylococcus epidermidis NOT DETECTED NOT DETECTED Final   Staphylococcus lugdunensis NOT DETECTED NOT DETECTED Final   Streptococcus species NOT DETECTED NOT DETECTED Final   Streptococcus agalactiae NOT DETECTED NOT DETECTED Final   Streptococcus pneumoniae NOT DETECTED NOT DETECTED Final   Streptococcus pyogenes NOT DETECTED NOT DETECTED Final   A.calcoaceticus-baumannii NOT DETECTED NOT DETECTED Final   Bacteroides fragilis DETECTED (A) NOT DETECTED Final    Comment: CRITICAL RESULT CALLED TO, READ BACK BY AND VERIFIED WITH: LISA KLUTTZ _2  ON 09/26/20.SH    Enterobacterales NOT DETECTED NOT DETECTED Final   Enterobacter cloacae complex NOT DETECTED NOT DETECTED Final   Escherichia coli NOT DETECTED NOT DETECTED Final   Klebsiella aerogenes NOT DETECTED NOT DETECTED Final   Klebsiella oxytoca NOT DETECTED NOT DETECTED Final   Klebsiella pneumoniae NOT DETECTED NOT DETECTED Final   Proteus species NOT DETECTED NOT DETECTED Final   Salmonella species NOT DETECTED NOT DETECTED Final   Serratia marcescens NOT DETECTED NOT DETECTED Final    Haemophilus influenzae NOT DETECTED NOT DETECTED Final   Neisseria meningitidis NOT DETECTED NOT DETECTED Final   Pseudomonas aeruginosa NOT DETECTED NOT DETECTED Final   Stenotrophomonas maltophilia NOT DETECTED NOT DETECTED Final   Candida albicans NOT DETECTED NOT DETECTED Final   Candida auris NOT DETECTED NOT DETECTED Final   Candida glabrata NOT DETECTED NOT DETECTED Final   Candida krusei NOT DETECTED NOT DETECTED Final   Candida parapsilosis NOT DETECTED NOT DETECTED Final   Candida tropicalis NOT DETECTED NOT DETECTED Final   Cryptococcus neoformans/gattii NOT DETECTED NOT DETECTED Final   Vancomycin resistance  NOT DETECTED NOT DETECTED Final    Comment: Performed at [facility], [address] [place], [place] [address]  Culture, blood (routine x 2)     Status: None   Collection Time: 09/25/20  3:42 PM   Specimen: BLOOD  Result Value Ref Range Status   Specimen Description BLOOD BLOOD LEFT HAND  Final   Special Requests   Final    BOTTLES DRAWN AEROBIC AND ANAEROBIC Blood Culture adequate volume   Culture   Final    NO GROWTH 5 DAYS Performed at [facility], [address] [place], [place] [address]    Report Status 09/30/2020 FINAL  Final  Resp Panel by RT-PCR (Flu A&B, Covid) Nasopharyngeal Swab     Status: None   Collection Time: 09/25/20  3:51 PM   Specimen: Nasopharyngeal Swab; Nasopharyngeal(NP) swabs in vial transport medium  Result Value Ref Range Status   SARS Coronavirus 2 by RT PCR NEGATIVE NEGATIVE Final    Comment: (NOTE) SARS-CoV-2 target nucleic acids are NOT DETECTED.  The SARS-CoV-2 RNA is generally detectable in upper respiratory specimens during the acute phase of infection. The lowest concentration of SARS-CoV-2 viral copies this assay can detect is 138 copies/mL. A negative result does not preclude SARS-Cov-2 infection and should not be used as the sole basis for treatment or other patient management decisions. A negative  result may occur with  improper specimen collection/handling, submission of specimen other than nasopharyngeal swab, presence of viral mutation(s) within the areas targeted by this assay, and inadequate number of viral copies(<138 copies/mL). A negative result must be combined with clinical observations, patient history, and epidemiological information. The expected result is Negative.  Fact Sheet for Patients:  EntrepreneurPulse.com.au  Fact Sheet for Healthcare Providers:  IncredibleEmployment.be  This test is no t yet approved or cleared by the [place] FDA and  has been authorized for detection and/or diagnosis of SARS-CoV-2 by FDA under an Emergency Use Authorization (EUA). This EUA will remain  in effect (meaning this test can be used) for the duration of the COVID-19 declaration under Section 564(b)(1) of the Act, 21 U.S.C.section 360bbb-3(b)(1), unless the authorization is terminated  or revoked sooner.       Influenza A by PCR NEGATIVE NEGATIVE Final   Influenza B by PCR NEGATIVE NEGATIVE Final    Comment: (NOTE) The Xpert Xpress SARS-CoV-2/FLU/RSV plus assay is intended as an aid in the diagnosis of influenza from Nasopharyngeal swab specimens and should not be used as a sole basis for treatment. Nasal washings and aspirates are unacceptable for Xpert Xpress SARS-CoV-2/FLU/RSV testing.  Fact Sheet for Patients: EntrepreneurPulse.com.au  Fact Sheet for Healthcare Providers: IncredibleEmployment.be  This test is not yet approved or cleared by the [place] FDA and has been authorized for detection and/or diagnosis of SARS-CoV-2 by FDA under an Emergency Use Authorization (EUA). This EUA will remain in effect (meaning this test can be used) for the duration of the COVID-19 declaration under Section 564(b)(1) of the Act, 21 U.S.C. section 360bbb-3(b)(1), unless the authorization is  terminated or revoked.  Performed at [facility], [address] [place], [place] [address]   MRSA PCR Screening     Status: None   Collection Time: 09/25/20  6:44 PM   Specimen: Nasopharyngeal  Result Value Ref Range Status   MRSA by PCR NEGATIVE NEGATIVE Final    Comment:        The GeneXpert MRSA Assay (FDA approved for NASAL specimens only), is one component  of a comprehensive MRSA colonization surveillance program. It is not intended to diagnose MRSA infection nor to guide or monitor treatment for MRSA infections. Performed at [facility], [address], [place], [place] [address]   Aerobic/Anaerobic Culture (surgical/deep wound)     Status: None   Collection Time: 09/25/20  9:02 PM   Specimen: Abscess  Result Value Ref Range Status   Specimen Description   Final    ABSCESS Performed at [facility], [address] [place], [place] [address]    Special Requests   Final    Normal Performed at [place], [place] [address]    Gram Stain   Final    ABUNDANT WBC PRESENT,BOTH PMN AND MONONUCLEAR MODERATE GRAM NEGATIVE COCCOBACILLI    Culture   Final    ABUNDANT ENTEROCOCCUS FAECALIS MODERATE BACTEROIDES FRAGILIS BETA LACTAMASE POSITIVE Performed at [facility], [place] [address], [place], [place] [address]    Report Status 09/28/2020 FINAL  Final   Organism ID, Bacteria ENTEROCOCCUS FAECALIS  Final      Susceptibility   Enterococcus faecalis - MIC*    AMPICILLIN <=2 SENSITIVE Sensitive     VANCOMYCIN 1 SENSITIVE Sensitive     GENTAMICIN SYNERGY SENSITIVE Sensitive     * ABUNDANT ENTEROCOCCUS FAECALIS  Urine culture     Status: None   Collection Time: 09/26/20  1:36 PM   Specimen: Urine, Random  Result Value Ref Range Status   Specimen Description   Final    URINE, RANDOM Performed at [facility], [address], [place], [place] [address]    Special Requests   Final     NONE Performed at [facility], [address], [place], [place] [address]    Culture   Final    NO GROWTH Performed at [facility], [place] [address], [place], [place] [address]    Report Status 09/28/2020 FINAL  Final  Culture, blood (single) w Reflex to ID Panel     Status: None (Preliminary result)   Collection Time: 09/30/20  3:35 PM   Specimen: BLOOD  Result Value Ref Range Status   Specimen Description BLOOD BLOOD RIGHT HAND  Final   Special Requests   Final    BOTTLES DRAWN AEROBIC AND ANAEROBIC Blood Culture adequate volume   Culture   Final    NO GROWTH < 24 HOURS Performed at [facility], [address] [place], [place] [address]    Report Status PENDING  Incomplete    Coagulation Studies: No results for input(s): LABPROT, INR in the last 72 hours.  Urinalysis: No results for input(s): COLORURINE, LABSPEC, PHURINE, GLUCOSEU, HGBUR, BILIRUBINUR, KETONESUR, PROTEINUR, UROBILINOGEN, NITRITE, LEUKOCYTESUR in the last 72 hours.  Invalid input(s): APPERANCEUR    Imaging: CT ABDOMEN PELVIS WO CONTRAST  Result Date: 09/30/2020 CLINICAL DATA:  Cholecystectomy 09/08/2020. Abdominal wall abscess with sepsis and bacteremia. EXAM: CT ABDOMEN AND PELVIS WITHOUT CONTRAST TECHNIQUE: Multidetector CT imaging of the abdomen and pelvis was performed following the standard protocol without IV contrast. COMPARISON:  09/25/2020 FINDINGS: Lower chest: Borderline cardiomegaly. The tip of the central line is visible in the distal SVC. Coronary and aortic atherosclerosis. Small bilateral pleural effusions with atelectasis in both lower lobes disproportionate to size of the pleural effusions. Nasogastric tube enters the stomach. Hepatobiliary: Pigtail catheter along the gallbladder fossa. Irregular collection of gas and fluid along the gallbladder fossa measuring about 6.8 by 3.6 by 5.2 cm traversed  by the pigtail catheter, the coil of the pigtail is in the posterior  portion of this collection. There also small locules of gas along the adjacent peritoneum near the entry site of the pigtail catheter for example on image 41 series 2. Mild hypodensity and expansion of the adjacent upper abdominal musculature in the vicinity of the pigtail catheter. Higher density elements along the collection of gas and fluid in the gallbladder fossa on image 36 of series 2, probably from suture material, less likely spilled gallstones. Pancreas: Unremarkable Spleen: Unremarkable Adrenals/Urinary Tract: Both adrenal glands appear normal. Foley catheter in the otherwise collapsed urinary bladder. No hydronephrosis or hydroureter. Exophytic 1.9 cm lesion of the left kidney upper pole laterally with some high density elements posteriorly, complex lesion not excluded. A small mass is not excluded. Stomach/Bowel: Nasogastric tube terminates in the stomach body. Transverse, descending, and sigmoid colon diverticulosis. There is some stranding in the adipose tissue adjacent to the hepatic flexure which is likely attributable to the inflammatory process along the gallbladder fossa; a hepatic flexure injury or fistula to the gallbladder fossa region is considered less likely although not totally excluded on today's noncontrast examination. Vascular/Lymphatic: Aortoiliac atherosclerotic vascular disease. Reproductive: Unremarkable Other: Subcutaneous edema especially laterally along the hips and flanks. Scattered omental, mesenteric, and particularly left retroperitoneal edema also tracking along the left iloipsoas. Mild ascites. Presacral edema. Musculoskeletal: Nonspecific rim sclerotic lesion in the left eighth rib laterally on image 23 series 2, stable from 2018. Lower thoracic and lumbar spondylosis and degenerative disc disease contributing to multilevel substantial impingement. IMPRESSION: 1. Collection of gas and fluid in the gallbladder fossa with small locules of gas also tracking along the pigtail  catheter drain along the peritoneal margin of the right anterior abdominal wall. Given that we are over 3 weeks out from cholecystectomy, this collection is abnormal and is compatible with abscess. As on prior exams, the adjacent hepatic flexure is somewhat indistinct with some spanning stranding, making it difficult to completely exclude a fistula between the gallbladder fossa region and the colon although no obvious linear collection of extraluminal gas is seen. 2. Third spacing of fluid with ascites, widespread mesenteric and subcutaneous edema. Retroperitoneal edema on the left. 3. Bibasilar atelectasis with small bilateral pleural effusions. 4.  Aortic Atherosclerosis (ICD10-I70.0).  Coronary atherosclerosis. 5. Multilevel thoracic and lumbar impingement. 6. Transverse, descending, and sigmoid colon diverticulosis. 7. Nonspecific sclerotic lesion in the left eighth rib laterally, no change from 2018, highly likely to be benign. 8. Potentially complex exophytic lesion from the left kidney upper pole, possibly a complex cyst although a small mass is not excluded. Electronically Signed   By: Van Clines M.D.   On: 09/30/2020 13:47   CT HEAD WO CONTRAST  Result Date: 09/30/2020 CLINICAL DATA:  Mental status change, unknown cause. Additional history provided: Patient extubated and still not at baseline. EXAM: CT HEAD WITHOUT CONTRAST TECHNIQUE: Contiguous axial images were obtained from the base of the skull through the vertex without intravenous contrast. COMPARISON:  Prior head CT 09/25/2020. FINDINGS: Brain: Mild cerebral atrophy. Mild patchy and ill-defined hypoattenuation within the cerebral white matter, nonspecific but compatible with chronic small vessel ischemic disease. There is no acute intracranial hemorrhage. No demarcated cortical infarct. No extra-axial fluid collection. No evidence of intracranial mass. No midline shift. Vascular: No hyperdense vessel.  Atherosclerotic calcifications.  Skull: Normal. Negative for fracture or focal lesion. Sinuses/Orbits: Visualized orbits show no acute finding. Trace bilateral ethmoid sinus mucosal thickening. IMPRESSION: No CT evidence of  acute intracranial abnormality. Stable non-contrast CT appearance of the brain as compared to 09/25/2020. Mild cerebral atrophy and chronic small vessel ischemic disease. Electronically Signed   By: Kellie Simmering DO   On: 09/30/2020 17:45     Medications:   . sodium chloride Stopped (09/25/20 1647)  . sodium chloride    . sodium chloride    . feeding supplement (VITAL 1.5 CAL) 45 mL/hr at 10/01/20 0554  . fentaNYL infusion INTRAVENOUS Stopped (09/30/20 1423)  . fluconazole (DIFLUCAN) IV Stopped (09/30/20 2304)  . norepinephrine (LEVOPHED) Adult infusion Stopped (09/30/20 0800)  . piperacillin-tazobactam (ZOSYN)  IV Stopped (10/01/20 0532)  . propofol (DIPRIVAN) infusion Stopped (09/30/20 1351)   . chlorhexidine gluconate (MEDLINE KIT)  15 mL Mouth Rinse BID  . Chlorhexidine Gluconate Cloth  6 each Topical Daily  . docusate  100 mg Per Tube BID  . feeding supplement (PROSource TF)  45 mL Per Tube Daily  . free water  30 mL Per Tube Q4H  . heparin injection (subcutaneous)  5,000 Units Subcutaneous Q8H  . hydrocortisone sod succinate (SOLU-CORTEF) inj  50 mg Intravenous Q12H  . insulin aspart  0-20 Units Subcutaneous Q4H  . insulin glargine  10 Units Subcutaneous QHS  . mouth rinse  15 mL Mouth Rinse 10 times per day  . midodrine  10 mg Per Tube TID WC  . multivitamin  1 tablet Per Tube QHS  . pantoprazole (PROTONIX) IV  40 mg Intravenous Q24H  . polyethylene glycol  17 g Per Tube Daily  . sodium chloride flush  3 mL Intravenous Q12H  . sodium chloride flush  5 mL Intracatheter Q8H   sodium chloride, acetaminophen, albuterol, docusate, fentaNYL (SUBLIMAZE) injection, haloperidol lactate, midazolam, ondansetron (ZOFRAN) IV, polyethylene glycol, sodium chloride flush  Assessment/ Plan:  65 y.o. male  with hypertension, diabetes mellitus type II, bladder cancer, admitted on 09/25/2020 for Septic shock (HCC) [A41.9, R65.21] Altered mental status, unspecified altered mental status type [R41.82] Postprocedural intraabdominal abscess [T81.43XA] Sepsis with acute renal failure and septic shock, due to unspecified organism, unspecified acute renal failure type ([place]) [A41.9, R65.21, N17.9]   # AKI  baseline creatinine of 1.1, GFR >60 on 08/19/20.  Lab Results  Component Value Date   CREATININE 5.65 (H) 10/01/2020   CREATININE 5.49 (H) 09/30/2020   CREATININE 6.13 (H) 09/29/2020   05/17 0701 - 05/18 0700 In: 1107.4 [I.V.:108.9; NG/GT:588.3; IV Piggyback:400.2] Out: 2353 [Urine:1750; Drains:5]  AKI likely secondary to ATN from sepsis  fair UOP   Tolerated HD well on 5/16  seen during HD today and tolerating well Re-assess daily for need of HD  # Sepsis, Gall bladder Abscess, S/p robotic assisted Lap [place]ole 09/08/2020 S/p CT guided GB abscess drain placement by VIR, on 5/12 Blood culture positive for Bacteroides and enterococcus on 5/12 - requiring pressors - zosyn   # hyperphosphatemia -in setting of renal failure  - Expected to correct some with HD Lab Results  Component Value Date   CALCIUM 7.1 (L) 10/01/2020   PHOS 6.6 (H) 10/01/2020     # Acute resp failure Vent dependent now intubated 09/27/20-09/30/2020     LOS: [place] 5/18/20229:35 AM  [place], [place]  Note: This note was prepared with Dragon dictation. Any transcription errors are unintentional

## 2020-10-01 NOTE — Progress Notes (Signed)
Patient went on AFIb RVR sustaining for a few minutes at 155-160.   SBP BP 130's to 140's. Patient is awake no complaints of pain nor any discomfort.  Dr. Mortimer Fries notified. HR went down 130's -140s when MD was on the phone.   He said he will come and assess the patient.  RN at bedside.

## 2020-10-01 NOTE — Progress Notes (Signed)
Daily Progress Note   Patient Name: Richard Oconnell       Date: 10/01/2020 DOB: January 16, 1956  Age: 65 y.o. MRN#: [id number] Attending Physician: Flora Lipps, MD Primary Care Physician: [name], DO Admit Date: 09/25/2020  Reason for Consultation/Follow-up: Establishing goals of care  Subjective: Patient is extubated and is currently receiving dialysis at this time. He only speaks to say "help me". He shakes his head "no" to say he does not have children or a spouse. He nods his head "yes" that he would want his sister and brother to be his surrogate decision maker. He closes his eyes after this; unable to have a [place] conversation with him as of yet. No family at bedside. Will continue to follow for needs.    Length of Stay: 6  Current Medications: Scheduled Meds:  . chlorhexidine gluconate (MEDLINE KIT)  15 mL Mouth Rinse BID  . Chlorhexidine Gluconate Cloth  6 each Topical Daily  . docusate  100 mg Per Tube BID  . feeding supplement (PROSource TF)  45 mL Per Tube Daily  . free water  30 mL Per Tube Q4H  . heparin injection (subcutaneous)  5,000 Units Subcutaneous Q8H  . insulin aspart  0-20 Units Subcutaneous Q4H  . insulin glargine  10 Units Subcutaneous QHS  . mouth rinse  15 mL Mouth Rinse 10 times per day  . multivitamin  1 tablet Per Tube QHS  . pantoprazole (PROTONIX) IV  40 mg Intravenous Q24H  . polyethylene glycol  17 g Per Tube Daily  . sodium chloride flush  3 mL Intravenous Q12H  . sodium chloride flush  5 mL Intracatheter Q8H    Continuous Infusions: . sodium chloride Stopped (09/25/20 1647)  . sodium chloride    . sodium chloride    . feeding supplement (VITAL 1.5 CAL) 45 mL/hr at 10/01/20 0554  . fentaNYL infusion INTRAVENOUS Stopped (09/30/20  1423)  . fluconazole (DIFLUCAN) IV Stopped (09/30/20 2304)  . norepinephrine (LEVOPHED) Adult infusion Stopped (09/30/20 0800)  . piperacillin-tazobactam (ZOSYN)  IV Stopped (10/01/20 0532)  . propofol (DIPRIVAN) infusion Stopped (09/30/20 1351)    PRN Meds: sodium chloride, acetaminophen, albuterol, docusate, fentaNYL (SUBLIMAZE) injection, haloperidol lactate, midazolam, ondansetron (ZOFRAN) IV, polyethylene glycol, sodium chloride flush  Physical Exam Constitutional:      Comments: Opens eyes.  Pulmonary:     Effort: Pulmonary effort is normal.             Vital Signs: BP (!) 158/106   Pulse (!) 102   Temp 98.2 F (36.8 C) (Axillary)   Resp (!) 24   Ht 6' 3.98" (1.93 m)   Wt 114 kg   SpO2 97%   BMI 30.61 kg/m  SpO2: SpO2: 97 % O2 Device: O2 Device: Room Air O2 Flow Rate: O2 Flow Rate (L/min): 2 L/min  Intake/output summary:   Intake/Output Summary (Last 24 hours) at 10/01/2020 1236 Last data filed at 10/01/2020 1206 Gross per 24 hour  Intake 992.29 ml  Output 1755 ml  Net -762.71 ml   LBM: Last BM Date: 09/26/20 Baseline Weight: Weight: 113 kg Most recent weight: Weight: 114 kg           Patient Active Problem List   Diagnosis Date Noted  . Acute respiratory failure (HCC) 09/28/2020  . Encounter for nasogastric (NG) tube placement   . Altered mental status   . Abscess   . Acute kidney failure, unspecified (HCC)   . Sepsis with acute renal failure and septic shock (HCC) 09/25/2020  . S/P laparoscopic cholecystectomy 09/08/2020  . Metabolic syndrome 07/09/2020  . Class 1 obesity due to excess calories with serious comorbidity and body mass index (BMI) of 33.0 to 33.9 in adult 07/06/2019  . Malignant neoplasm of urinary bladder (HCC) 12/06/2018  . Overweight (BMI 25.0-29.9) 07/04/2018  . Hyperlipidemia associated with type 2 diabetes mellitus (HCC) 11/30/2016  . 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 Gross hematuria 11/16/2016  . Erectile dysfunction  associated with type 2 diabetes mellitus (HCC) 05/26/2016  . Diabetes mellitus with coincident hypertension (HCC) 06/11/2015  . Type 2 diabetes mellitus with diabetic polyneuropathy, without long-term current use of insulin (HCC) 06/11/2015    Palliative Care Assessment & Plan    Recommendations/Plan:  Continue full code/full scope. Patient is currently unable to have GOC conversation himself. Will continue to follow.    Code Status:    Code Status Orders  (From admission, onward)         Start     Ordered   09/25/20 1738  Full code  Continuous        09/25/20 1739        Code Status History    Date Active Date Inactive Code Status Order ID Comments User Context   09/08/2020 1930 09/09/2020 1917 Full Code [id number]  Cannon, Jennifer, MD Inpatient   07/09/2020 1819 07/12/2020 2215 Full Code [id number]  Cox, Amy N, DO ED   Advance Care Planning Activity        Thank you for allowing the Palliative Medicine Team to assist in the care of this patient.   Total Time 15 min Prolonged Time Billed no      Greater than 50%  of this time was spent counseling and coordinating care related to the above assessment and plan.   , NP  Please contact Palliative Medicine Team phone at [phone] for questions and concerns.      

## 2020-10-02 ENCOUNTER — Other Ambulatory Visit: Payer: Medicare Other

## 2020-10-02 ENCOUNTER — Inpatient Hospital Stay: Payer: Medicare Other

## 2020-10-02 ENCOUNTER — Telehealth: Payer: Self-pay

## 2020-10-02 DIAGNOSIS — R6520 Severe sepsis without septic shock: Secondary | ICD-10-CM | POA: Diagnosis not present

## 2020-10-02 DIAGNOSIS — I4891 Unspecified atrial fibrillation: Secondary | ICD-10-CM

## 2020-10-02 DIAGNOSIS — A4181 Sepsis due to Enterococcus: Secondary | ICD-10-CM | POA: Diagnosis not present

## 2020-10-02 DIAGNOSIS — A419 Sepsis, unspecified organism: Secondary | ICD-10-CM | POA: Diagnosis not present

## 2020-10-02 DIAGNOSIS — J9601 Acute respiratory failure with hypoxia: Secondary | ICD-10-CM | POA: Diagnosis not present

## 2020-10-02 DIAGNOSIS — A4189 Other specified sepsis: Secondary | ICD-10-CM | POA: Diagnosis not present

## 2020-10-02 DIAGNOSIS — R404 Transient alteration of awareness: Secondary | ICD-10-CM

## 2020-10-02 DIAGNOSIS — R6521 Severe sepsis with septic shock: Secondary | ICD-10-CM | POA: Diagnosis not present

## 2020-10-02 DIAGNOSIS — B9689 Other specified bacterial agents as the cause of diseases classified elsewhere: Secondary | ICD-10-CM | POA: Diagnosis not present

## 2020-10-02 DIAGNOSIS — N170 Acute kidney failure with tubular necrosis: Secondary | ICD-10-CM | POA: Diagnosis not present

## 2020-10-02 LAB — RENAL FUNCTION PANEL
Albumin: 2 g/dL — ABNORMAL LOW (ref 3.5–5.0)
Anion gap: 13 (ref 5–15)
BUN: 64 mg/dL — ABNORMAL HIGH (ref 8–23)
CO2: 26 mmol/L (ref 22–32)
Calcium: 7.3 mg/dL — ABNORMAL LOW (ref 8.9–10.3)
Chloride: 100 mmol/L (ref 98–111)
Creatinine, Ser: 4.4 mg/dL — ABNORMAL HIGH (ref 0.61–1.24)
GFR, Estimated: 14 mL/min — ABNORMAL LOW (ref >60)
Glucose, Bld: 197 mg/dL — ABNORMAL HIGH (ref 70–99)
Phosphorus: 3.8 mg/dL (ref 2.5–4.6)
Potassium: 3.3 mmol/L — ABNORMAL LOW (ref 3.5–5.1)
Sodium: 139 mmol/L (ref 135–145)

## 2020-10-02 LAB — CBC
HCT: 36.6 % — ABNORMAL LOW (ref 39.0–52.0)
Hemoglobin: 12.3 g/dL — ABNORMAL LOW (ref 13.0–17.0)
MCH: 26.9 pg (ref 26.0–34.0)
MCHC: 33.6 g/dL (ref 30.0–36.0)
MCV: 79.9 fL — ABNORMAL LOW (ref 80.0–100.0)
Platelets: 379 10*3/uL (ref 150–400)
RBC: 4.58 MIL/uL (ref 4.22–5.81)
RDW: 16.7 % — ABNORMAL HIGH (ref 11.5–15.5)
WBC: 21.6 10*3/uL — ABNORMAL HIGH (ref 4.0–10.5)
nRBC: 0 % (ref 0.0–0.2)

## 2020-10-02 LAB — GLUCOSE, CAPILLARY
Glucose-Capillary: 188 mg/dL — ABNORMAL HIGH (ref 70–99)
Glucose-Capillary: 197 mg/dL — ABNORMAL HIGH (ref 70–99)
Glucose-Capillary: 167 mg/dL — ABNORMAL HIGH (ref 70–99)
Glucose-Capillary: 174 mg/dL — ABNORMAL HIGH (ref 70–99)
Glucose-Capillary: 213 mg/dL — ABNORMAL HIGH (ref 70–99)

## 2020-10-02 LAB — MAGNESIUM: Magnesium: 2.2 mg/dL (ref 1.7–2.4)

## 2020-10-02 MED ORDER — PIPERACILLIN-TAZOBACTAM 3.375 G IVPB
3.3750 g | Freq: Three times a day (TID) | INTRAVENOUS | Status: AC
  Administered 2020-10-02: 3.375 g via INTRAVENOUS
  Filled 2020-10-02: qty 50

## 2020-10-02 MED ORDER — SODIUM CHLORIDE 0.9% FLUSH
5.0000 mL | Freq: Three times a day (TID) | INTRAVENOUS | Status: DC
  Administered 2020-10-02 – 2020-10-08 (×15): 5 mL

## 2020-10-02 MED ORDER — MELATONIN 5 MG PO TABS
5.0000 mg | ORAL_TABLET | Freq: Every evening | ORAL | Status: DC | PRN
  Administered 2020-10-11 – 2020-10-13 (×3): 5 mg via ORAL
  Filled 2020-10-02 (×4): qty 1

## 2020-10-02 MED ORDER — SODIUM CHLORIDE 0.9 % IV SOLN
3.0000 g | Freq: Two times a day (BID) | INTRAVENOUS | Status: DC
  Administered 2020-10-02 – 2020-10-06 (×8): 3 g via INTRAVENOUS
  Filled 2020-10-02: qty 8
  Filled 2020-10-02: qty 3
  Filled 2020-10-02: qty 8
  Filled 2020-10-02: qty 3
  Filled 2020-10-02: qty 8
  Filled 2020-10-02: qty 3
  Filled 2020-10-02: qty 8
  Filled 2020-10-02 (×2): qty 3

## 2020-10-02 MED ORDER — FENTANYL CITRATE (PF) 100 MCG/2ML IJ SOLN
INTRAMUSCULAR | Status: AC
  Filled 2020-10-02: qty 2

## 2020-10-02 MED ORDER — MIDAZOLAM HCL 2 MG/2ML IJ SOLN
INTRAMUSCULAR | Status: AC
  Filled 2020-10-02: qty 2

## 2020-10-02 NOTE — Progress Notes (Signed)
Pt to IR at this time for drain repositioning. Sarah, Charge RN has traveled to [place] with patient.

## 2020-10-02 NOTE — Progress Notes (Signed)
Pt returned to unit VSS.

## 2020-10-02 NOTE — Procedures (Signed)
  Procedure: CT guided revision RUQ drain 51f EBL:   minimal Complications:  none immediate  See full dictation in [employer].  Dillard Cannon MD Main # [phone] Pager  [phone] Mobile [phone]

## 2020-10-02 NOTE — Progress Notes (Addendum)
[facility] Day(s): 7.   Interval History:  Patient seen and examined Late in the day had issues with Afib, RVR. Started on amiodarone. In NSR this morning Patient opens eyes to verbal stimuli, unable to reliably participate, incoherent sounds His leukocytosis did jump this morning; now 21.6K Renal function improved some, sCr - 4.40; UO - 1.7L Persistent hypokalemia to 3.3 Drain with 5 ccs recorded; seropurulent Plan for drain adjustment with IR today  Cx from drain placement growing enterococcus faecalis; continues on Zosyn Started on tube feedings yesterday (05/16); tolerating   Vital signs in last 24 hours: [min-max] current  Temp:  [97.8 F (36.6 C)-99.8 F (37.7 C)] 99.4 F (37.4 C) (05/19 0800) Pulse Rate:  [85-143] 104 (05/19 0800) Resp:  [18-32] 32 (05/19 0800) BP: (119-169)/(73-137) 154/91 (05/19 0800) SpO2:  [93 %-97 %] 93 % (05/19 0800) Weight:  [114.6 kg] 114.6 kg (05/19 0408)     Height: 6' 3.98" (193 cm) Weight: 114.6 kg BMI (Calculated): 30.77   Intake/Output last 2 shifts:  05/18 0701 - 05/19 0700 In: 1942.1 [I.V.:391.7; NG/GT:968.7; IV Piggyback:571.8] Out: 0174 [Urine:1780; Drains:5]   Physical Exam:  Constitutional: opens eyes to voice; incoherent vocalizations HEENT: NGT in place, poor dentition  Respiratory: breathing non-labored at rest  Cardiovascular: regular rate and sinus rhythm  Gastrointestinal: Soft, non-tender, and non-distended, no rebound/guarding. percutaneous drain in the RUQ with seropurulent output Genitourinary: Foley in place Integumentary: warm, dry  Labs:  CBC Latest Ref Rng & Units 10/02/2020 10/01/2020 09/30/2020  WBC 4.0 - 10.5 K/uL 21.6(H) 18.9(H) 19.3(H)  Hemoglobin 13.0 - 17.0 g/dL 12.3(L) 11.3(L) 9.6(L)  Hematocrit 39.0 - 52.0 % 36.6(L) 33.4(L) 28.7(L)  Platelets 150 - 400 K/uL 379 381 373   CMP Latest Ref Rng & Units 10/02/2020 10/01/2020 10/01/2020  Glucose 70 - 99 mg/dL  197(H) - 209(H)  BUN 8 - 23 mg/dL 64(H) - 92(H)  Creatinine 0.61 - 1.24 mg/dL 4.40(H) - 5.65(H)  Sodium 135 - 145 mmol/L 139 - 136  Potassium 3.5 - 5.1 mmol/L 3.3(L) 3.1(L) 3.2(L)  Chloride 98 - 111 mmol/L 100 - 95(L)  CO2 22 - 32 mmol/L 26 - 27  Calcium 8.9 - 10.3 mg/dL 7.3(L) - 7.1(L)  Total Protein 6.5 - 8.1 g/dL - - -  Total Bilirubin 0.3 - 1.2 mg/dL - - -  Alkaline Phos 38 - 126 U/L - - -  AST 15 - 41 U/L - - -  ALT 0 - 44 U/L - - -     Imaging studies: No new pertinent imaging studies   Assessment/Plan:  65 y.o. male with severe sepsis, AKI, and encephalopathy secondary to gallbladder fossa abscess 23 days s/p robotic assisted laparoscopic cholecystectomy for severe cholecystitis               - IR to adjust drain today; continue to monitor and record output             - Hold enteric feedings this morning for IR procedure; okay to resume after             - Continue IV Abx (Zsoyn); Cx with enterococcus faecalis; sensitivities reviewed             - Appreciate nephrology assistance             - Monitor leukocytosis; slightly worse             - Monitor renal function; improved some; nephrology on board              -  Further management per primary service; we will follow      All of the above findings and recommendations were discussed with the patient, and the medical team, and all of patient's questions were answered to his expressed satisfaction.  -- Edison Simon, PA-C [facility] 10/02/2020, 8:39 AM [phone] M-F: 7am - 4pm  I saw and evaluated the patient.  I agree with the above documentation, exam, and plan, which I have edited where appropriate. [name]  9:18 AM

## 2020-10-02 NOTE — TOC Progression Note (Addendum)
Transition of Care [facility]) - Progression Note    Patient Details  Name: Richard Oconnell MRN: [id number] Date of Birth: Aug 06, 1955  Transition of Care [facility]) CM/SW [place], [place] Phone Number: 10/02/2020, 4:03 PM  Clinical Narrative:     CSW reached out to [facility] and they will assess if the patient will meet criteria for placement when he is medically ready.  Expected Discharge Plan: [place] Barriers to Discharge: Continued Medical Work up  Expected Discharge Plan and Services Expected Discharge Plan: [place] In-house Referral: Clinical Social Work   Post Acute Care Choice: [place] Living arrangements for the past 2 months: Single Family Home                                       Social Determinants of Health (SDOH) Interventions    Readmission Risk Interventions No flowsheet data found.

## 2020-10-02 NOTE — Progress Notes (Signed)
Pharmacy Antibiotic Note  Richard Oconnell is a 65 y.o. male admitted on 09/25/2020. Pharmacy has been consulted for ampicillin/sulbactam.  Patient with cholecystitis in February s/p perc drain placement then lap chole on 4/25. Surgical drain removed 5/3 and presents with sepsis.  IR placed drain 5/12 and repositioned 5/19 with increased output.  Blood culture and drain culture with E. Faecalis and bacteroides. Patient previously on piperacillin/tazobactam  Today, 10/02/2020 Day #7 antibiotics, Day #4 fluconazole  Plan:  Based on blood and drain cultures, adjust antibiotics to ampicillin/sulbactam 3gm IV q12h based on current renal function.   Follow renal function Getting IHD PRN  Continue fluconazole - check LFTs  Height: 6' 3.98" (193 cm) Weight: 114.6 kg (252 lb 10.4 oz) IBW/kg (Calculated) : 86.76  Temp (24hrs), Avg:99.2 F (37.3 C), Min:98.4 F (36.9 C), Max:99.8 F (37.7 C)  Recent Labs  Lab 09/25/20 1905 09/25/20 2151 09/26/20 0420 09/28/20 0418 09/29/20 0456 09/30/20 0514 10/01/20 0340 10/01/20 0346 10/02/20 0305  WBC  --   --    < > 36.9* 27.0* 19.3*  --  18.9* 21.6*  CREATININE  --   --    < > 5.42* 6.13* 5.49* 5.65*  --  4.40*  LATICACIDVEN 4.4* 3.1*  --   --   --   --   --   --   --    < > = values in this interval not displayed.    Estimated Creatinine Clearance: 23.2 mL/min (A) (by C-G formula based on SCr of 4.4 mg/dL (H)).    Allergies  Allergen Reactions  . Cyclobenzaprine Other (See Comments)    Not improving pain and makes him agressive.  . Lisinopril Cough    Antimicrobials this admission: Zosyn 5/12 >> 5/19 Amp/sulb 5/19 >>   Microbiology results: 5/12 BCID/Bcx: E faecalis and B frag 5/12 abscess culture: E.faecalis, B frag 5/12 MRSA PCR: negative  Thank you for allowing pharmacy to be a part of this patient's care.  Doreene Eland, PharmD, BCPS.   Work Cell: [phone] 10/02/2020 3:58 PM

## 2020-10-02 NOTE — Progress Notes (Signed)
Patient clinically stable post 14FR drain placement with Dr Vernard Gambles. CCU RN/Sarah with patient. Only local anesthetic used for procedure, tolerated well. Vitals stable throughout and after procedure. Tan/purulent drainage removed 23ml/ with more fluid in gravity drainage bag. Transported back to CCU per Sarah/charge nurse.

## 2020-10-02 NOTE — Plan of Care (Addendum)
Patient was unable to answer any of my alert and oriented questions. Patient laying in bed with eyes wide open. Patient unable to respond when asked if he is in pain or would like something to help him sleep. Patient had a large liquid stool. Upon cleaning patient up, patient scrotum became extremely red and started to bleed. The source of the bleed was found and pressure was placed. Bleeding stopped. Patient now appears to be sleeping. Will continue patient care.   Update 10/02/2020 2320  Patient was asked if he is cold or in pain. Patient answered no to both questions. Patient was asked if he would like something to help him sleep, patient was told that melatonin is ordered for him. Patient answered no. Measured patients abdomen to maintain accurate documentation of any changes. Patient abdomen measured at 29 inches. Will continue patient care.  Update 10/03/2020 0640  Re-measured patients abdomen. No change to measurement. Will continue patient care.   Problem: Clinical Measurements: Goal: Diagnostic test results will improve Outcome: Progressing Goal: Respiratory complications will improve Outcome: Progressing Goal: Cardiovascular complication will be avoided Outcome: Progressing   Problem: Nutrition: Goal: Adequate nutrition will be maintained Outcome: Progressing   Problem: Elimination: Goal: Will not experience complications related to bowel motility Outcome: Progressing Goal: Will not experience complications related to urinary retention Outcome: Progressing   Problem: Pain Managment: Goal: General experience of comfort will improve Outcome: Progressing   Problem: Safety: Goal: Ability to remain free from injury will improve Outcome: Progressing   Problem: Education: Goal: Knowledge of General Education information will improve Description: Including pain rating scale, medication(s)/side effects and non-pharmacologic comfort measures Outcome: Not Progressing   Problem:  Health Behavior/Discharge Planning: Goal: Ability to manage health-related needs will improve Outcome: Not Progressing   Problem: Clinical Measurements: Goal: Ability to maintain clinical measurements within normal limits will improve Outcome: Not Progressing Goal: Will remain free from infection Outcome: Not Progressing   Problem: Activity: Goal: Risk for activity intolerance will decrease Outcome: Not Progressing   Problem: Coping: Goal: Level of anxiety will decrease Outcome: Not Progressing   Problem: Skin Integrity: Goal: Risk for impaired skin integrity will decrease Outcome: Not Progressing

## 2020-10-02 NOTE — Telephone Encounter (Signed)
FYI:  I called to speak with the patient's sister Audree Camel (per hippa) because she had wanted to make sure Dr. Raliegh Ip was informed that the pt is currently in the ICU at [facility].   She wants to f/u in office after the pt is discharged and I let her know that the hospital usually calls to set up a follow up anyway.

## 2020-10-02 NOTE — Progress Notes (Signed)
[facility], [place] 10/02/20  Subjective:   Hospital day # 7  Patient is critically ill  Renal: 05/18 0701 - 05/19 0700 In: 1942.1 [I.V.:391.7; NG/GT:968.7; IV Piggyback:571.8] Out: 6269 [Urine:1780; Drains:5] Lab Results  Component Value Date   CREATININE 4.40 (H) 10/02/2020   CREATININE 5.65 (H) 10/01/2020   CREATININE 5.49 (H) 09/30/2020    following simple commands   Objective:  Vital signs in last 24 hours:  Temp:  [98.2 F (36.8 C)-99.8 F (37.7 C)] 99.4 F (37.4 C) (05/19 0800) Pulse Rate:  [94-143] 102 (05/19 1000) Resp:  [18-38] 38 (05/19 1000) BP: (119-169)/(73-137) 152/94 (05/19 1000) SpO2:  [93 %-96 %] 93 % (05/19 1000) Weight:  [114.6 kg] 114.6 kg (05/19 0408)  Weight change: 0.6 kg Filed Weights   09/30/20 0500 10/01/20 0451 10/02/20 0408  Weight: 118.3 kg 114 kg 114.6 kg    Intake/Output:    Intake/Output Summary (Last 24 hours) at 10/02/2020 1016 Last data filed at 10/02/2020 0825 Gross per 24 hour  Intake 1952.26 ml  Output 2085 ml  Net -132.74 ml    Physical Exam: General:  No acute distress, laying in the bed  HEENT  NGT in place, mouth and tongue dry  Pulm/lungs  Coarse breath sounds , tachypneic  CVS/Heart  regular rhythm, no rub or gallop  Abdomen:   Soft, nontender, gall bladder drain in place  Extremities:  No peripheral edema  Neurologic:  follows simple commands  Skin:  No acute rashes   foley in place Left IJ temporary cath 09/27/20- ICU team  Basic Metabolic Panel:  Recent Labs  Lab 09/28/20 0418 09/29/20 0456 09/30/20 0514 10/01/20 0340 10/01/20 0346 10/01/20 2236 10/02/20 0305  NA 134* 131* 135 136  --   --  139  K 4.3 3.8 3.5 3.2*  --  3.1* 3.3*  CL 93* 87* 94* 95*  --   --  100  CO2 25 28 29 27   --   --  26  GLUCOSE 230* 228* 148* 209*  --   --  197*  BUN 74* 86* 81* 92*  --   --  64*  CREATININE 5.42* 6.13* 5.49* 5.65*  --   --  4.40*  CALCIUM 7.1* 6.7* 7.0* 7.1*  --   --  7.3*   MG 2.4 2.5* 2.4  --  2.4  --  2.2  PHOS 8.8* 8.6* 6.8* 6.6*  --   --  3.8     CBC: Recent Labs  Lab 09/25/20 1444 09/26/20 0420 09/27/20 0416 09/28/20 0418 09/29/20 0456 09/30/20 0514 10/01/20 0346 10/02/20 0305  WBC 25.5*   < > 29.2* 36.9* 27.0* 19.3* 18.9* 21.6*  NEUTROABS 24.0*  --  26.0* 32.5* 23.2*  --   --   --   HGB 11.5*   < > 10.7* 11.5* 10.5* 9.6* 11.3* 12.3*  HCT 34.1*   < > 30.7* 33.4* 30.1* 28.7* 33.4* 36.6*  MCV 80.4   < > 77.9* 79.1* 78.0* 79.5* 79.7* 79.9*  PLT 535*   < > 465* 562* 475* 373 381 379   < > = values in this interval not displayed.      Lab Results  Component Value Date   HEPBSAG NON REACTIVE 09/26/2020   HEPBSAB NON REACTIVE 09/26/2020   HEPBIGM NON REACTIVE 09/26/2020      Microbiology:  Recent Results (from the past 240 hour(s))  Culture, blood (routine x 2)     Status: Abnormal   Collection Time:  09/25/20  2:41 PM   Specimen: BLOOD  Result Value Ref Range Status   Specimen Description   Final    BLOOD RIGHT ANTECUBITAL Performed at [facility], [address] [place], [place] [address]    Special Requests   Final    BOTTLES DRAWN AEROBIC AND ANAEROBIC Blood Culture results may not be optimal due to an excessive volume of blood received in culture bottles Performed at [facility], [address] [place], [place] [address]    Culture  Setup Time   Final    Organism ID to follow [place] CRITICAL RESULT CALLED TO, READ BACK BY AND VERIFIED WITH: LISA KLUTTZ @0802  ON 09/26/20.[place] Performed at [facility], [address] [place], [place] [address]    Culture ENTEROCOCCUS FAECALIS (A)  Final   Report Status 09/28/2020 FINAL  Final   Organism ID, Bacteria ENTEROCOCCUS FAECALIS  Final      Susceptibility   Enterococcus faecalis - MIC*    AMPICILLIN <=2 SENSITIVE Sensitive     VANCOMYCIN 1 SENSITIVE Sensitive     GENTAMICIN SYNERGY SENSITIVE  Sensitive     * ENTEROCOCCUS FAECALIS  Blood Culture ID Panel (Reflexed)     Status: Abnormal   Collection Time: 09/25/20  2:41 PM  Result Value Ref Range Status   Enterococcus faecalis DETECTED (A) NOT DETECTED Final    Comment: CRITICAL RESULT CALLED TO, READ BACK BY AND VERIFIED WITH: LISA KLUTTZ @0802  ON 09/26/20.[facility]    Enterococcus Faecium NOT DETECTED NOT DETECTED Final   Listeria monocytogenes NOT DETECTED NOT DETECTED Final   Staphylococcus species NOT DETECTED NOT DETECTED Final   Staphylococcus aureus (BCID) NOT DETECTED NOT DETECTED Final   Staphylococcus epidermidis NOT DETECTED NOT DETECTED Final   Staphylococcus lugdunensis NOT DETECTED NOT DETECTED Final   Streptococcus species NOT DETECTED NOT DETECTED Final   Streptococcus agalactiae NOT DETECTED NOT DETECTED Final   Streptococcus pneumoniae NOT DETECTED NOT DETECTED Final   Streptococcus pyogenes NOT DETECTED NOT DETECTED Final   A.calcoaceticus-baumannii NOT DETECTED NOT DETECTED Final   Bacteroides fragilis DETECTED (A) NOT DETECTED Final    Comment: CRITICAL RESULT CALLED TO, READ BACK BY AND VERIFIED WITH: LISA KLUTTZ @0802  ON 09/26/20.[place]    Enterobacterales NOT DETECTED NOT DETECTED Final   Enterobacter cloacae complex NOT DETECTED NOT DETECTED Final   Escherichia coli NOT DETECTED NOT DETECTED Final   Klebsiella aerogenes NOT DETECTED NOT DETECTED Final   Klebsiella oxytoca NOT DETECTED NOT DETECTED Final   Klebsiella pneumoniae NOT DETECTED NOT DETECTED Final   Proteus species NOT DETECTED NOT DETECTED Final   Salmonella species NOT DETECTED NOT DETECTED Final   Serratia marcescens NOT DETECTED NOT DETECTED Final   Haemophilus influenzae NOT DETECTED NOT DETECTED Final   Neisseria meningitidis NOT DETECTED NOT DETECTED Final   Pseudomonas aeruginosa NOT DETECTED NOT DETECTED Final   Stenotrophomonas maltophilia NOT DETECTED NOT DETECTED Final   Candida albicans NOT DETECTED NOT DETECTED Final   Candida  auris NOT DETECTED NOT DETECTED Final   Candida glabrata NOT DETECTED NOT DETECTED Final   Candida krusei NOT DETECTED NOT DETECTED Final   Candida parapsilosis NOT DETECTED NOT DETECTED Final   Candida tropicalis NOT DETECTED NOT DETECTED Final   Cryptococcus neoformans/gattii NOT DETECTED NOT DETECTED Final   Vancomycin resistance NOT DETECTED NOT DETECTED Final    Comment: Performed at [facility], [address] [place],  [address]  Culture, blood (routine  x 2)     Status: None   Collection Time: 09/25/20  3:42 PM   Specimen: BLOOD  Result Value Ref Range Status   Specimen Description BLOOD BLOOD LEFT HAND  Final   Special Requests   Final    BOTTLES DRAWN AEROBIC AND ANAEROBIC Blood Culture adequate volume   Culture   Final    NO GROWTH 5 DAYS Performed at [facility], [address] [place], [place] [address]    Report Status 09/30/2020 FINAL  Final  Resp Panel by RT-PCR (Flu A&B, Covid) Nasopharyngeal Swab     Status: None   Collection Time: 09/25/20  3:51 PM   Specimen: Nasopharyngeal Swab; Nasopharyngeal(NP) swabs in vial transport medium  Result Value Ref Range Status   SARS Coronavirus 2 by RT PCR NEGATIVE NEGATIVE Final    Comment: (NOTE) SARS-CoV-2 target nucleic acids are NOT DETECTED.  The SARS-CoV-2 RNA is generally detectable in upper respiratory specimens during the acute phase of infection. The lowest concentration of SARS-CoV-2 viral copies this assay can detect is 138 copies/mL. A negative result does not preclude SARS-Cov-2 infection and should not be used as the sole basis for treatment or other patient management decisions. A negative result may occur with  improper specimen collection/handling, submission of specimen other than nasopharyngeal swab, presence of viral mutation(s) within the areas targeted by this assay, and inadequate number of viral copies(<138 copies/mL). A negative result must be combined with clinical  observations, patient history, and epidemiological information. The expected result is Negative.  Fact Sheet for Patients:  EntrepreneurPulse.com.au  Fact Sheet for Healthcare Providers:  IncredibleEmployment.be  This test is no t yet approved or cleared by the [place] FDA and  has been authorized for detection and/or diagnosis of SARS-CoV-2 by FDA under an Emergency Use Authorization (EUA). This EUA will remain  in effect (meaning this test can be used) for the duration of the COVID-19 declaration under Section 564(b)(1) of the Act, 21 U.S.C.section 360bbb-3(b)(1), unless the authorization is terminated  or revoked sooner.       Influenza A by PCR NEGATIVE NEGATIVE Final   Influenza B by PCR NEGATIVE NEGATIVE Final    Comment: (NOTE) The Xpert Xpress SARS-CoV-2/FLU/RSV plus assay is intended as an aid in the diagnosis of influenza from Nasopharyngeal swab specimens and should not be used as a sole basis for treatment. Nasal washings and aspirates are unacceptable for Xpert Xpress SARS-CoV-2/FLU/RSV testing.  Fact Sheet for Patients: EntrepreneurPulse.com.au  Fact Sheet for Healthcare Providers: IncredibleEmployment.be  This test is not yet approved or cleared by the [place] FDA and has been authorized for detection and/or diagnosis of SARS-CoV-2 by FDA under an Emergency Use Authorization (EUA). This EUA will remain in effect (meaning this test can be used) for the duration of the COVID-19 declaration under Section 564(b)(1) of the Act, 21 U.S.C. section 360bbb-3(b)(1), unless the authorization is terminated or revoked.  Performed at [facility], [address] [place], [place] [address]   MRSA PCR Screening     Status: None   Collection Time: 09/25/20  6:44 PM   Specimen: Nasopharyngeal  Result Value Ref Range Status   MRSA by PCR NEGATIVE NEGATIVE Final    Comment:         The GeneXpert MRSA Assay (FDA approved for NASAL specimens only), is one component of a comprehensive MRSA colonization surveillance program. It is not intended to diagnose MRSA infection nor to guide or monitor treatment for MRSA infections. Performed  at [facility], [address], [place], [place] [address]   Aerobic/Anaerobic Culture (surgical/deep wound)     Status: None   Collection Time: 09/25/20  9:02 PM   Specimen: Abscess  Result Value Ref Range Status   Specimen Description   Final    ABSCESS Performed at [facility], [address] [place], [place] [address]    Special Requests   Final    Normal Performed at [place], [place] [address]    Gram Stain   Final    ABUNDANT WBC PRESENT,BOTH PMN AND MONONUCLEAR MODERATE GRAM NEGATIVE COCCOBACILLI    Culture   Final    ABUNDANT ENTEROCOCCUS FAECALIS MODERATE BACTEROIDES FRAGILIS BETA LACTAMASE POSITIVE Performed at [facility], [place] [address], [place], [place] [address]    Report Status 09/28/2020 FINAL  Final   Organism ID, Bacteria ENTEROCOCCUS FAECALIS  Final      Susceptibility   Enterococcus faecalis - MIC*    AMPICILLIN <=2 SENSITIVE Sensitive     VANCOMYCIN 1 SENSITIVE Sensitive     GENTAMICIN SYNERGY SENSITIVE Sensitive     * ABUNDANT ENTEROCOCCUS FAECALIS  Urine culture     Status: None   Collection Time: 09/26/20  1:36 PM   Specimen: Urine, Random  Result Value Ref Range Status   Specimen Description   Final    URINE, RANDOM Performed at [facility], [address], [place], [place] [address]    Special Requests   Final    NONE Performed at [facility], [address] [place], [place] [address]    Culture   Final    NO GROWTH Performed at [facility], [place] [address], [place], [place] [address]    Report Status 09/28/2020 FINAL  Final  Culture, blood (single) w Reflex to ID Panel     Status: None  (Preliminary result)   Collection Time: 09/30/20  3:35 PM   Specimen: BLOOD  Result Value Ref Range Status   Specimen Description BLOOD BLOOD RIGHT HAND  Final   Special Requests   Final    BOTTLES DRAWN AEROBIC AND ANAEROBIC Blood Culture adequate volume   Culture   Final    NO GROWTH 2 DAYS Performed at [facility], [address] [place], [place] [address]    Report Status PENDING  Incomplete    Coagulation Studies: No results for input(s): LABPROT, INR in the last 72 hours.  Urinalysis: No results for input(s): COLORURINE, LABSPEC, PHURINE, GLUCOSEU, HGBUR, BILIRUBINUR, KETONESUR, PROTEINUR, UROBILINOGEN, NITRITE, LEUKOCYTESUR in the last 72 hours.  Invalid input(s): APPERANCEUR    Imaging: CT ABDOMEN PELVIS WO CONTRAST  Result Date: 09/30/2020 CLINICAL DATA:  Cholecystectomy 09/08/2020. Abdominal wall abscess with sepsis and bacteremia. EXAM: CT ABDOMEN AND PELVIS WITHOUT CONTRAST TECHNIQUE: Multidetector CT imaging of the abdomen and pelvis was performed following the standard protocol without IV contrast. COMPARISON:  09/25/2020 FINDINGS: Lower chest: Borderline cardiomegaly. The tip of the central line is visible in the distal SVC. Coronary and aortic atherosclerosis. Small bilateral pleural effusions with atelectasis in both lower lobes disproportionate to size of the pleural effusions. Nasogastric tube enters the stomach. Hepatobiliary: Pigtail catheter along the gallbladder fossa. Irregular collection of gas and fluid along the gallbladder fossa measuring about 6.8 by 3.6 by 5.2 cm traversed by the pigtail catheter, the coil of the pigtail is in the posterior portion of this collection. There also small locules of gas along the adjacent  peritoneum near the entry site of the pigtail catheter for example on image 41 series 2. Mild hypodensity and expansion of the adjacent upper abdominal musculature in the vicinity of the pigtail catheter. Higher density elements along  the collection of gas and fluid in the gallbladder fossa on image 36 of series 2, probably from suture material, less likely spilled gallstones. Pancreas: Unremarkable Spleen: Unremarkable Adrenals/Urinary Tract: Both adrenal glands appear normal. Foley catheter in the otherwise collapsed urinary bladder. No hydronephrosis or hydroureter. Exophytic 1.9 cm lesion of the left kidney upper pole laterally with some high density elements posteriorly, complex lesion not excluded. A small mass is not excluded. Stomach/Bowel: Nasogastric tube terminates in the stomach body. Transverse, descending, and sigmoid colon diverticulosis. There is some stranding in the adipose tissue adjacent to the hepatic flexure which is likely attributable to the inflammatory process along the gallbladder fossa; a hepatic flexure injury or fistula to the gallbladder fossa region is considered less likely although not totally excluded on today's noncontrast examination. Vascular/Lymphatic: Aortoiliac atherosclerotic vascular disease. Reproductive: Unremarkable Other: Subcutaneous edema especially laterally along the hips and flanks. Scattered omental, mesenteric, and particularly left retroperitoneal edema also tracking along the left iloipsoas. Mild ascites. Presacral edema. Musculoskeletal: Nonspecific rim sclerotic lesion in the left eighth rib laterally on image 23 series 2, stable from 2018. Lower thoracic and lumbar spondylosis and degenerative disc disease contributing to multilevel substantial impingement. IMPRESSION: 1. Collection of gas and fluid in the gallbladder fossa with small locules of gas also tracking along the pigtail catheter drain along the peritoneal margin of the right anterior abdominal wall. Given that we are over 3 weeks out from cholecystectomy, this collection is abnormal and is compatible with abscess. As on prior exams, the adjacent hepatic flexure is somewhat indistinct with some spanning stranding, making it  difficult to completely exclude a fistula between the gallbladder fossa region and the colon although no obvious linear collection of extraluminal gas is seen. 2. Third spacing of fluid with ascites, widespread mesenteric and subcutaneous edema. Retroperitoneal edema on the left. 3. Bibasilar atelectasis with small bilateral pleural effusions. 4.  Aortic Atherosclerosis (ICD10-I70.0).  Coronary atherosclerosis. 5. Multilevel thoracic and lumbar impingement. 6. Transverse, descending, and sigmoid colon diverticulosis. 7. Nonspecific sclerotic lesion in the left eighth rib laterally, no change from 2018, highly likely to be benign. 8. Potentially complex exophytic lesion from the left kidney upper pole, possibly a complex cyst although a small mass is not excluded. Electronically Signed   By: Van Clines M.D.   On: 09/30/2020 13:47   CT HEAD WO CONTRAST  Result Date: 09/30/2020 CLINICAL DATA:  Mental status change, unknown cause. Additional history provided: Patient extubated and still not at baseline. EXAM: CT HEAD WITHOUT CONTRAST TECHNIQUE: Contiguous axial images were obtained from the base of the skull through the vertex without intravenous contrast. COMPARISON:  Prior head CT 09/25/2020. FINDINGS: Brain: Mild cerebral atrophy. Mild patchy and ill-defined hypoattenuation within the cerebral white matter, nonspecific but compatible with chronic small vessel ischemic disease. There is no acute intracranial hemorrhage. No demarcated cortical infarct. No extra-axial fluid collection. No evidence of intracranial mass. No midline shift. Vascular: No hyperdense vessel.  Atherosclerotic calcifications. Skull: Normal. Negative for fracture or focal lesion. Sinuses/Orbits: Visualized orbits show no acute finding. Trace bilateral ethmoid sinus mucosal thickening. IMPRESSION: No CT evidence of acute intracranial abnormality. Stable non-contrast CT appearance of the brain as compared to 09/25/2020. Mild cerebral  atrophy and chronic small vessel ischemic disease. Electronically Signed  By: Kellie Simmering DO   On: 09/30/2020 17:45     Medications:   . sodium chloride Stopped (09/25/20 1647)  . sodium chloride    . sodium chloride    . amiodarone 30 mg/hr (10/02/20 0825)  . feeding supplement (VITAL 1.5 CAL) Stopped (10/02/20 0000)  . fentaNYL infusion INTRAVENOUS Stopped (09/30/20 1423)  . fluconazole (DIFLUCAN) IV Stopped (10/01/20 2332)  . norepinephrine (LEVOPHED) Adult infusion Stopped (09/30/20 0800)  . piperacillin-tazobactam (ZOSYN)  IV    . propofol (DIPRIVAN) infusion Stopped (09/30/20 1351)   . chlorhexidine gluconate (MEDLINE KIT)  15 mL Mouth Rinse BID  . Chlorhexidine Gluconate Cloth  6 each Topical Daily  . docusate  100 mg Per Tube BID  . feeding supplement (PROSource TF)  45 mL Per Tube Daily  . fentaNYL      . free water  30 mL Per Tube Q4H  . heparin injection (subcutaneous)  5,000 Units Subcutaneous Q8H  . insulin aspart  0-20 Units Subcutaneous Q4H  . insulin glargine  10 Units Subcutaneous QHS  . mouth rinse  15 mL Mouth Rinse 10 times per day  . midazolam      . multivitamin  1 tablet Per Tube QHS  . pantoprazole (PROTONIX) IV  40 mg Intravenous Q24H  . polyethylene glycol  17 g Per Tube Daily  . sodium chloride flush  3 mL Intravenous Q12H  . sodium chloride flush  5 mL Intracatheter Q8H   sodium chloride, acetaminophen, albuterol, docusate, fentaNYL (SUBLIMAZE) injection, haloperidol lactate, midazolam, ondansetron (ZOFRAN) IV, polyethylene glycol, sodium chloride flush  Assessment/ Plan:  65 y.o. male with hypertension, diabetes mellitus type II, bladder cancer, admitted on 09/25/2020 for Septic shock (HCC) [A41.9, R65.21] Altered mental status, unspecified altered mental status type [R41.82] Postprocedural intraabdominal abscess [T81.43XA] Sepsis with acute renal failure and septic shock, due to unspecified organism, unspecified acute renal failure type ([place])  [A41.9, R65.21, N17.9]   # AKI  baseline creatinine of 1.1, GFR >60 on 08/19/20.  Lab Results  Component Value Date   CREATININE 4.40 (H) 10/02/2020   CREATININE 5.65 (H) 10/01/2020   CREATININE 5.49 (H) 09/30/2020   05/18 0701 - 05/19 0700 In: 1942.1 [I.V.:391.7; NG/GT:968.7; IV Piggyback:571.8] Out: 9935 [Urine:1780; Drains:5]  AKI likely secondary to ATN from sepsis  fair UOP   Tolerated HD well on 5/16, 5/18  Re-assess daily for need of HD. No acute indication today  # Sepsis, Gall bladder Abscess, S/p robotic assisted Lap [place] 09/08/2020 S/p CT guided GB abscess drain placement by VIR, on 5/12 Blood culture positive for Bacteroides and enterococcus on 5/12 - Off pressors - zosyn, fluconazole   # hyperphosphatemia -in setting of renal failure  - Expected to correct with HD Lab Results  Component Value Date   CALCIUM 7.3 (L) 10/02/2020   PHOS 3.8 10/02/2020     # Acute resp failure intubated 09/27/20-09/30/2020  improving   LOS: [place] 5/19/202210:16 AM  [place], [place]  Note: This note was prepared with Dragon dictation. Any transcription errors are unintentional

## 2020-10-02 NOTE — Progress Notes (Signed)
NAME:  Richard Oconnell, MRN:  [id number], DOB:  12-15-1955, LOS: 7 ADMISSION DATE:  09/25/2020 REFERRING MD:  Charna Archer CHIEF COMPLAINT:  Feeling bad and belly pain  Brief Pt Description/Synopsis:  65 yo male with previous dx of acute cholecystitis s/p cholecystotomy tube 2/24 and then follow up robotic assisted cholecystectomy 4/25, now presents with large abdominal abscess with severe septic shock and BACTEREMIA, with multiorgan failure (liver and renal failure) complicated by severe metabolic acidosis and toxic metabolic encephalopathy.  History of Present Illness:  65 year old man past medical history of hypertension, diabetes, and bladder cancer who was hospitalized with acute cholecystitis-several months ago 2/24 He was temporized with a percutaneous cholecystostomy tube.    Follow up  cholangiogram via the tube demonstrates that the cystic duct is still obstructed He was offered him a robot-assisted laparoscopic cholecystectomy, but  deferred this procedure  On 4/25 recently had cholecystectomy along with significant lysis of adhesions on 4/25 here at [facility].  He presents today with somnolence and confusion.   Family reports that patient is disoriented the day before yesterday, after which his skin has begun to appear increasingly yellow.    +discomfort in the right upper quadrant of his abdomen, liver failure, renal failure, severe acidosis Patient started on pressors, admitted for septic shock  WBC 25 LA 6.5-->8.7 CREAT 4.8 TB 9.5   Pertinent  Medical History  2/24 admitted for acute cholecystitis,percutaneous cholecystostomy tube placed by IR  3/29 offered  a robot-assisted laparoscopic cholecystectomy, but deferred  4/25 s/p cholecystectomy along with significant lysis of adhesions on 4/25 here at [facility].   Significant Hospital Events: Including procedures, antibiotic start and stop dates in addition to other pertinent events   2/24 admitted for acute  cholecystitis,percutaneous cholecystostomy tube placed by IR  3/29 offered  a robot-assisted laparoscopic cholecystectomy, but deferred  4/25 s/p cholecystectomy along with significant lysis of adhesions on 4/25 here at [facility]. 5/12 admission to ICU for large abd abscess, jaundice and septic shock, on pressors 5/12 CT abd-Large abscess in the gallbladder fossa, at least 10 cm in size; Percutaneous drain placed by IR 5/13: Nephrology following, starting Bicarb gtt, remains on Levophed; Blood cultures growing Enterococcus faecalis,Bacteroides fragilis.  Pt extremely delirious and agitated, trying to leave to go home, interfering with medical treatment, plan to start Precedex. High risk for intubation due to severe Encephalopathy 5/14severe sepsis, high risk for intubation, on precedex,  5/14 INTUBATED AND LEFT TRIALYSIS CATHETER PLACED 5/16: Plan for SBT following Hemodialysis.  Add Fluconazole for intraabdominal coverage 5/17: Repeat CT Abdomen/Pelvis @ 09:00.  Plan for SBT and hopeful for extubation as pt following commands this morning. Levophed currently weaned off 5/18: Extubated yesterday, now on room air.  Remains encephalopathic, repeat CT head negative yesterday.  To get Hemodialysis today. 5/19 high risk for re-intubation, plan for IR replacement of tube today   Micro Data:  09/25/2020: SARS-CoV-2 PCR>> negative 09/25/2020: Influenza PCR>> negative 09/25/2020: Blood culture x2>>Enterococcus faecalis,Bacteroides fragilis 09/25/2020: MRSA PCR>> negative 09/25/2020: Aerobic/anaerobic culture from abdominal abscess>> ENTEROCOCCUS FAECALIS, BACTEROIDES FRAGILIS, BETA LACTAMASE POSITIVE (sensitive to Ampicillin, Gentamycin, Vancomycin) 09/26/2020: Urine>>no growth 09/30/2020: Blood culture>>  Antimicrobials:  Zosyn 5/12>> Fluconazole 5/16>>   Antibiotics Given (last 72 hours)    Date/Time Action Medication Dose Rate   09/29/20 1337 New Bag/Given   piperacillin-tazobactam (ZOSYN) IVPB 2.25 g  2.25 g 100 mL/hr   09/29/20 1737 New Bag/Given   piperacillin-tazobactam (ZOSYN) IVPB 2.25 g 2.25 g 100 mL/hr   09/29/20 2336 New Bag/Given  piperacillin-tazobactam (ZOSYN) IVPB 2.25 g 2.25 g 100 mL/hr   09/30/20 0516 New Bag/Given   piperacillin-tazobactam (ZOSYN) IVPB 2.25 g 2.25 g 100 mL/hr   09/30/20 1129 New Bag/Given   piperacillin-tazobactam (ZOSYN) IVPB 2.25 g 2.25 g 100 mL/hr   09/30/20 1800 New Bag/Given   piperacillin-tazobactam (ZOSYN) IVPB 2.25 g 2.25 g 100 mL/hr   10/01/20 0019 New Bag/Given   piperacillin-tazobactam (ZOSYN) IVPB 2.25 g 2.25 g 100 mL/hr   10/01/20 0502 New Bag/Given   piperacillin-tazobactam (ZOSYN) IVPB 2.25 g 2.25 g 100 mL/hr   10/01/20 1300 New Bag/Given   piperacillin-tazobactam (ZOSYN) IVPB 2.25 g 2.25 g 100 mL/hr   10/01/20 1705 New Bag/Given   piperacillin-tazobactam (ZOSYN) IVPB 2.25 g 2.25 g 100 mL/hr   10/01/20 2343 New Bag/Given   piperacillin-tazobactam (ZOSYN) IVPB 2.25 g 2.25 g 100 mL/hr   10/02/20 0508 New Bag/Given   piperacillin-tazobactam (ZOSYN) IVPB 2.25 g 2.25 g 100 mL/hr      Interval History:  Remains critically ill +Large abd abscess  High risk for re-intubation +encephlopathy from uremia/sepsis Placed on AMIO for afib with RVR   Objective   Blood pressure (!) 154/85, pulse 100, temperature 99.8 F (37.7 C), temperature source Axillary, resp. rate (!) 31, height 6' 3.98" (1.93 m), weight 114.6 kg, SpO2 93 %.        Intake/Output Summary (Last 24 hours) at 10/02/2020 0740 Last data filed at 10/02/2020 0600 Gross per 24 hour  Intake 1942.07 ml  Output 1785 ml  Net 157.07 ml   Filed Weights   09/30/20 0500 10/01/20 0451 10/02/20 0408  Weight: 118.3 kg 114 kg 114.6 kg    REVIEW OF SYSTEMS  PATIENT IS UNABLE TO PROVIDE COMPLETE REVIEW OF SYSTEMS DUE TO SEVERE CRITICAL ILLNESS AND TOXIC METABOLIC ENCEPHALOPATHY PHYSICAL EXAMINATION:  GENERAL:critically ill appearing, +resp distress HEAD: Normocephalic,  atraumatic.  EYES: Pupils equal, round, reactive to light.  No scleral icterus.  MOUTH: Moist mucosal membrane. NECK: Supple. No thyromegaly. No nodules. No JVD.  PULMONARY: +rhonchi, +wheezing CARDIOVASCULAR: S1 and S2. Regular rate and rhythm. No murmurs, rubs, or gallops.  GASTROINTESTINAL: , +distended.  MUSCULOSKELETAL: +edema.  NEUROLOGIC: lethargic SKIN:intact,warm,dry     Labs/imaging that I havepersonally reviewed  (right click and "Reselect all SmartList Selections" daily)  Labs 10/01/2020: K 3.2, glucose 209, BUN 92, Cr. 5.65, albumin 2, WBC 18.9, Hgb 11.3, HCT 33.4 CT Abdomen & Pelvis 5/17>>1. Collection of gas and fluid in the gallbladder fossa with small locules of gas also tracking along the pigtail catheter drain along the peritoneal margin of the right anterior abdominal wall. Given that we are over 3 weeks out from cholecystectomy, this collection is abnormal and is compatible with abscess. As on prior exams, the adjacent hepatic flexure is somewhat indistinct with some spanning stranding, making it difficult to completely exclude a fistula between the gallbladder fossa region and the colon although no obvious linear collection of extraluminal gas is seen. 2. Third spacing of fluid with ascites, widespread mesenteric and subcutaneous edema. Retroperitoneal edema on the left. 3. Bibasilar atelectasis with small bilateral pleural effusions. 4.  Aortic Atherosclerosis (ICD10-I70.0).  Coronary atherosclerosis. 5. Multilevel thoracic and lumbar impingement. 6. Transverse, descending, and sigmoid colon diverticulosis. 7. Nonspecific sclerotic lesion in the left eighth rib laterally, no change from 2018, highly likely to be benign. 8. Potentially complex exophytic lesion from the left kidney upper pole, possibly a complex cyst although a small mass is not excluded. CT Head w/o contrast 5/17>>No CT evidence of  acute intracranial abnormality. Stable non-contrast CT  appearance of the brain as compared to 09/25/2020.Mild cerebral atrophy and chronic small vessel ischemic disease.  ASSESSMENT/PLAN    65 yo male with previous dx of acute cholecystitis s/p cholecystotomy tube 2/24 and then follow up robotic assisted cholecystectomy 4/25 now presents with large abdominal abscess with severe septic shock with multiorgan failure (liver and renal failure) complicated by severe metabolic acidosis and toxic metabolic encephalopathyleading to severe hypoxic respiratory failure and progressive renal failure and metabolic acidosis, s/p extubation and now dealing with encephalopathy and large abd asbcess  +Enterococcus faecalis , +Bacteroides fragilis and moderate gram-negative coccobacilli (culture pending) from [place]  Severe ACUTE Hypoxic and Hypercapnic Respiratory Failure Extubated but  Very high risk to re-intubation    Septic shock-severe bacteremia from large abd abscess  -use vasopressors to keep MAP>65 as needed   ACUTE  CARDIAC FAILURE- due to afib with RVR Place don AMIO infusion      INFECTIOUS DISEASE -continue antibiotics as prescribed -follow up cultures -follow up ID consultation    NEUROLOGY ACUTE TOXIC METABOLIC ENCEPHALOPATHY Acute toxic metabolic encephalopathy, suspect in setting of Sepsis, AKI, and sleep deprivation High risk for aspiration and intubation  ACUTE KIDNEY INJURY/Renal Failure -continue Foley Catheter-assess need -Avoid nephrotoxic agents -Follow urine output, BMP -Ensure adequate renal perfusion, optimize oxygenation -Renal dose medications HD as needed    ELECTROLYTES -follow labs as needed -replace as needed -pharmacy consultation and following     GI-Large abd abscess  GI PROPHYLAXIS as indicated  IR to replace drain into abscess     NUTRITIONAL STATUS  DIET-->NPO for now Constipation protocol as indicated  Follow up general surgery recs  Assess tube IR/GEN SURGERY        Best practice (right click and "Reselect all SmartList Selections" daily)  Diet: NPO, tube feeds Pain/Anxiety/Delirium protocol (if indicated): N/A VAP protocol (if indicated): N/A DVT prophylaxis: Heparin SQ GI prophylaxis: PPI Glucose control:  SSI, Lantus Central venous access:  Yes, and still needed Arterial line:  N/A Foley:  Yes, and still needed Mobility:  OOB as tolerated PT consulted: N/A Code Status:  full code Disposition: ICU   Labs   CBC: Recent Labs  Lab 09/25/20 1444 09/26/20 0420 09/27/20 0416 09/28/20 0418 09/29/20 0456 09/30/20 0514 10/01/20 0346 10/02/20 0305  WBC 25.5*   < > 29.2* 36.9* 27.0* 19.3* 18.9* 21.6*  NEUTROABS 24.0*  --  26.0* 32.5* 23.2*  --   --   --   HGB 11.5*   < > 10.7* 11.5* 10.5* 9.6* 11.3* 12.3*  HCT 34.1*   < > 30.7* 33.4* 30.1* 28.7* 33.4* 36.6*  MCV 80.4   < > 77.9* 79.1* 78.0* 79.5* 79.7* 79.9*  PLT 535*   < > 465* 562* 475* 373 381 379   < > = values in this interval not displayed.    Basic Metabolic Panel: Recent Labs  Lab 09/28/20 0418 09/29/20 0456 09/30/20 0514 10/01/20 0340 10/01/20 0346 10/01/20 2236 10/02/20 0305  NA 134* 131* 135 136  --   --  139  K 4.3 3.8 3.5 3.2*  --  3.1* 3.3*  CL 93* 87* 94* 95*  --   --  100  CO2 25 28 29 27   --   --  26  GLUCOSE 230* 228* 148* 209*  --   --  197*  BUN 74* 86* 81* 92*  --   --  64*  CREATININE 5.42* 6.13* 5.49* 5.65*  --   --  4.40*  CALCIUM 7.1* 6.7* 7.0* 7.1*  --   --  7.3*  MG 2.4 2.5* 2.4  --  2.4  --  2.2  PHOS 8.8* 8.6* 6.8* 6.6*  --   --  3.8   GFR: Estimated Creatinine Clearance: 23.2 mL/min (A) (by C-G formula based on SCr of 4.4 mg/dL (H)). Recent Labs  Lab 09/25/20 1502 09/25/20 1905 09/25/20 2151 09/26/20 0420 09/27/20 0416 09/28/20 0418 09/29/20 0456 09/30/20 0514 10/01/20 0346 10/02/20 0305  PROCALCITON  --   --   --  148.71 99.94 56.30  --   --   --   --   WBC  --   --   --  45.5* 29.2* 36.9* 27.0* 19.3* 18.9* 21.6*   LATICACIDVEN 8.7*  6.5* 4.4* 3.1*  --   --   --   --   --   --   --     Liver Function Tests: Recent Labs  Lab 09/25/20 1444 09/26/20 0420 09/27/20 0416 09/28/20 0418 09/29/20 0456 09/30/20 0514 10/01/20 0340 10/02/20 0305  AST 121* 133* 100* 91* 55*  --   --   --   ALT 56* 56* 48* 53* 42  --   --   --   ALKPHOS 459* 360* 279* 255* 213*  --   --   --   BILITOT 9.5* 6.7* 3.9* 2.9* 2.2*  --   --   --   PROT 8.2* 7.6 6.8 6.3* 5.8*  --   --   --   ALBUMIN 2.6* 2.4* 2.0* 1.9* 1.8* 1.9* 2.0* 2.0*   No results for input(s): LIPASE, AMYLASE in the last 168 hours. Recent Labs  Lab 09/25/20 1502 09/30/20 1751  AMMONIA 19 20    ABG    Component Value Date/Time   PHART 7.35 09/27/2020 1155   PCO2ART 42 09/27/2020 1155   PO2ART 71 (L) 09/27/2020 1155   HCO3 23.2 09/27/2020 1155   ACIDBASEDEF 2.3 (H) 09/27/2020 1155   O2SAT 93.1 09/27/2020 1155     Coagulation Profile: Recent Labs  Lab 09/25/20 1444  INR 1.5*    HbA1C: Hemoglobin A1C  Date/Time Value Ref Range Status  12/27/2019 12:00 AM 6.9  Final    Comment:    [facility]   Hgb A1c MFr Bld  Date/Time Value Ref Range Status  09/26/2020 04:20 AM 6.7 (H) 4.8 - 5.6 % Final    Comment:    (NOTE) Pre diabetes:          5.7%-6.4%  Diabetes:              >6.4%  Glycemic control for   <7.0% adults with diabetes   05/30/2020 10:47 AM 7.2 (H) <5.7 % of total Hgb Final    Comment:    For someone without known diabetes, a hemoglobin A1c value of 6.5% or greater indicates that they may have  diabetes and this should be confirmed with a follow-up  test. . For someone with known diabetes, a value <7% indicates  that their diabetes is well controlled and a value  greater than or equal to 7% indicates suboptimal  control. A1c targets should be individualized based on  duration of diabetes, age, comorbid conditions, and  other considerations. . Currently, no consensus exists regarding use of hemoglobin A1c  for diagnosis of diabetes for children. .     CBG: Recent Labs  Lab 10/01/20 1547 10/01/20 1927 10/01/20 2312 10/02/20 0334 10/02/20 0720  GLUCAP 233* 273* 225* 188* 197*  Past Medical History:  He,  has a past medical history of Cancer [facility]), Erectile dysfunction associated with type 2 diabetes mellitus ([place]) (05/26/2016), Gross hematuria (11/16/2016), Hypertension (06/11/2015), and Type 2 diabetes mellitus with diabetic polyneuropathy, without long-term current use of insulin ([place]) (06/11/2015).   Surgical History:   Past Surgical History:  Procedure Laterality Date  . boil lanceted     hand  . COLONOSCOPY WITH PROPOFOL N/A 07/03/2020   Procedure: COLONOSCOPY WITH PROPOFOL;  Surgeon: Jonathon Bellows, MD;  Location: [facility] ENDOSCOPY;  Service: Gastroenterology;  Laterality: N/A;  . CYSTOSCOPY W/ RETROGRADES Bilateral 12/06/2016   Procedure: CYSTOSCOPY WITH RETROGRADE PYELOGRAM;  Surgeon: Hollice Espy, MD;  Location: [facility] ORS;  Service: Urology;  Laterality: Bilateral;  . CYSTOSCOPY W/ RETROGRADES Bilateral 06/27/2017   Procedure: CYSTOSCOPY WITH RETROGRADE PYELOGRAM;  Surgeon: Hollice Espy, MD;  Location: [facility] ORS;  Service: Urology;  Laterality: Bilateral;  . CYSTOSCOPY WITH BIOPSY N/A 06/27/2017   Procedure: CYSTOSCOPY WITH Bladder BIOPSY;  Surgeon: Hollice Espy, MD;  Location: [facility] ORS;  Service: Urology;  Laterality: N/A;  . CYSTOSCOPY WITH BIOPSY N/A 10/24/2017   Procedure: CYSTOSCOPY WITH Bladder BIOPSY;  Surgeon: Hollice Espy, MD;  Location: [facility] ORS;  Service: Urology;  Laterality: N/A;  . ingrown  Bilateral    ingrown toenail  . IR BILIARY DRAIN PLACEMENT WITH CHOLANGIOGRAM  07/10/2020  . IR CHOLANGIOGRAM EXISTING TUBE  08/07/2020  . IR RADIOLOGIST EVAL & MGMT  08/07/2020  . TRANSURETHRAL RESECTION OF BLADDER TUMOR N/A 12/06/2016   Procedure: TRANSURETHRAL RESECTION OF BLADDER TUMOR (TURBT) (2-5cm) CLOT EVACUATION;  Surgeon: Hollice Espy, MD;  Location: [facility] ORS;   Service: Urology;  Laterality: N/A;  . TRANSURETHRAL RESECTION OF BLADDER TUMOR N/A 06/27/2017   Procedure: TRANSURETHRAL RESECTION OF BLADDER TUMOR (TURBT);  Surgeon: Hollice Espy, MD;  Location: [facility] ORS;  Service: Urology;  Laterality: N/A;  . TRANSURETHRAL RESECTION OF BLADDER TUMOR WITH MITOMYCIN-C N/A 01/18/2017   Procedure: TRANSURETHRAL RESECTION OF BLADDER TUMOR WITH MITOMYCIN-C-(SMALL);  Surgeon: Hollice Espy, MD;  Location: [facility] ORS;  Service: Urology;  Laterality: N/A;     Social History:   reports that he has never smoked. He has never used smokeless tobacco. He reports that he does not drink alcohol and does not use drugs.   Family History:  His family history includes Heart attack in his maternal aunt, maternal grandfather, maternal grandmother, and mother; Heart disease in his maternal grandfather and mother; Parkinson's disease in his father. There is no history of Prostate cancer, Bladder Cancer, or Kidney cancer.   Allergies Allergies  Allergen Reactions  . Cyclobenzaprine Other (See Comments)    Not improving pain and makes him agressive.  . Lisinopril Cough       DVT/GI PRX  assessed I Assessed the need for Labs I Assessed the need for Foley I Assessed the need for Central Venous Line Family Discussion when available I Assessed the need for Mobilization I made an Assessment of medications to be adjusted accordingly Safety Risk assessment completed  CASE DISCUSSED IN MULTIDISCIPLINARY ROUNDS WITH ICU TEAM     Critical Care Time devoted to patient care services described in this note is 60 minutes.  Critical care was necessary to treat /prevent imminent and life-threatening deterioration. Overall, patient is critically ill, prognosis is guarded.  Patient with Multiorgan failure and at high risk for cardiac arrest and death.    Corrin Parker, M.D.  [facility]  Medical Director [place] Director  [place]  Department

## 2020-10-03 ENCOUNTER — Other Ambulatory Visit: Payer: Self-pay

## 2020-10-03 ENCOUNTER — Telehealth: Payer: Self-pay

## 2020-10-03 DIAGNOSIS — A4181 Sepsis due to Enterococcus: Secondary | ICD-10-CM | POA: Diagnosis not present

## 2020-10-03 DIAGNOSIS — R6521 Severe sepsis with septic shock: Secondary | ICD-10-CM | POA: Diagnosis not present

## 2020-10-03 DIAGNOSIS — A4189 Other specified sepsis: Secondary | ICD-10-CM | POA: Diagnosis not present

## 2020-10-03 DIAGNOSIS — R6520 Severe sepsis without septic shock: Secondary | ICD-10-CM | POA: Diagnosis not present

## 2020-10-03 DIAGNOSIS — K750 Abscess of liver: Secondary | ICD-10-CM | POA: Diagnosis not present

## 2020-10-03 DIAGNOSIS — B9689 Other specified bacterial agents as the cause of diseases classified elsewhere: Secondary | ICD-10-CM | POA: Diagnosis not present

## 2020-10-03 DIAGNOSIS — R4182 Altered mental status, unspecified: Secondary | ICD-10-CM | POA: Diagnosis not present

## 2020-10-03 DIAGNOSIS — A419 Sepsis, unspecified organism: Secondary | ICD-10-CM | POA: Diagnosis not present

## 2020-10-03 LAB — GLUCOSE, CAPILLARY
Glucose-Capillary: 188 mg/dL — ABNORMAL HIGH (ref 70–99)
Glucose-Capillary: 199 mg/dL — ABNORMAL HIGH (ref 70–99)
Glucose-Capillary: 202 mg/dL — ABNORMAL HIGH (ref 70–99)
Glucose-Capillary: 214 mg/dL — ABNORMAL HIGH (ref 70–99)
Glucose-Capillary: 224 mg/dL — ABNORMAL HIGH (ref 70–99)
Glucose-Capillary: 234 mg/dL — ABNORMAL HIGH (ref 70–99)
Glucose-Capillary: 251 mg/dL — ABNORMAL HIGH (ref 70–99)

## 2020-10-03 LAB — MAGNESIUM: Magnesium: 2.2 mg/dL (ref 1.7–2.4)

## 2020-10-03 LAB — RENAL FUNCTION PANEL
Albumin: 1.9 g/dL — ABNORMAL LOW (ref 3.5–5.0)
Anion gap: 13 (ref 5–15)
BUN: 67 mg/dL — ABNORMAL HIGH (ref 8–23)
CO2: 25 mmol/L (ref 22–32)
Calcium: 7.5 mg/dL — ABNORMAL LOW (ref 8.9–10.3)
Chloride: 105 mmol/L (ref 98–111)
Creatinine, Ser: 4.55 mg/dL — ABNORMAL HIGH (ref 0.61–1.24)
GFR, Estimated: 14 mL/min — ABNORMAL LOW (ref >60)
Glucose, Bld: 263 mg/dL — ABNORMAL HIGH (ref 70–99)
Phosphorus: 4.7 mg/dL — ABNORMAL HIGH (ref 2.5–4.6)
Potassium: 3.4 mmol/L — ABNORMAL LOW (ref 3.5–5.1)
Sodium: 143 mmol/L (ref 135–145)

## 2020-10-03 LAB — CBC
HCT: 34.1 % — ABNORMAL LOW (ref 39.0–52.0)
Hemoglobin: 11.5 g/dL — ABNORMAL LOW (ref 13.0–17.0)
MCH: 27.1 pg (ref 26.0–34.0)
MCHC: 33.7 g/dL (ref 30.0–36.0)
MCV: 80.4 fL (ref 80.0–100.0)
Platelets: 316 10*3/uL (ref 150–400)
RBC: 4.24 MIL/uL (ref 4.22–5.81)
RDW: 17.3 % — ABNORMAL HIGH (ref 11.5–15.5)
WBC: 14.7 10*3/uL — ABNORMAL HIGH (ref 4.0–10.5)
nRBC: 0 % (ref 0.0–0.2)

## 2020-10-03 LAB — HEPATIC FUNCTION PANEL
ALT: 24 U/L (ref 0–44)
AST: 39 U/L (ref 15–41)
Albumin: 1.9 g/dL — ABNORMAL LOW (ref 3.5–5.0)
Alkaline Phosphatase: 168 U/L — ABNORMAL HIGH (ref 38–126)
Bilirubin, Direct: 0.5 mg/dL — ABNORMAL HIGH (ref 0.0–0.2)
Indirect Bilirubin: 0.7 mg/dL (ref 0.3–0.9)
Total Bilirubin: 1.2 mg/dL (ref 0.3–1.2)
Total Protein: 5.6 g/dL — ABNORMAL LOW (ref 6.5–8.1)

## 2020-10-03 MED ORDER — INSULIN GLARGINE 100 UNIT/ML ~~LOC~~ SOLN
15.0000 [IU] | Freq: Every day | SUBCUTANEOUS | Status: DC
  Administered 2020-10-03 – 2020-10-07 (×5): 15 [IU] via SUBCUTANEOUS
  Filled 2020-10-03 (×6): qty 0.15

## 2020-10-03 NOTE — Progress Notes (Signed)
NAME:  Richard Oconnell, MRN:  [id number], DOB:  12/12/1955, LOS: 81 ADMISSION DATE:  09/25/2020 REFERRING MD:  Richard Oconnell CHIEF COMPLAINT:  Feeling bad and belly pain  Brief Pt Description/Synopsis:  65 yo male with previous dx of acute cholecystitis s/p cholecystotomy tube 2/24 and then follow up robotic assisted cholecystectomy 4/25, now presents with large abdominal abscess with severe septic shock and BACTEREMIA, with multiorgan failure (liver and renal failure) complicated by severe metabolic acidosis and toxic metabolic encephalopathy.  History of Present Illness:  65 year old man past medical history of hypertension, diabetes, and bladder cancer who was hospitalized with acute cholecystitis-several months ago 2/24 He was temporized with a percutaneous cholecystostomy tube.    Follow up  cholangiogram via the tube demonstrates that the cystic duct is still obstructed He was offered him a robot-assisted laparoscopic cholecystectomy, but  deferred this procedure  On 4/25 recently had cholecystectomy along with significant lysis of adhesions on 4/25 here at [facility]  He presents today with somnolence and confusion.   Family reports that patient is disoriented the day before yesterday, after which his skin has begun to appear increasingly yellow.    +discomfort in the right upper quadrant of his abdomen, liver failure, renal failure, severe acidosis Patient started on pressors, admitted for septic shock  WBC 25 LA 6.5-->8.7 CREAT 4.8 TB 9.5   Pertinent  Medical History  2/24 admitted for acute cholecystitis,percutaneous cholecystostomy tube placed by IR  3/29 offered  a robot-assisted laparoscopic cholecystectomy, but deferred  4/25 s/p cholecystectomy along with significant lysis of adhesions on 4/25 here at [facility].   Significant Hospital Events: Including procedures, antibiotic start and stop dates in addition to other pertinent events   2/24 admitted for acute  cholecystitis,percutaneous cholecystostomy tube placed by IR  3/29 offered  a robot-assisted laparoscopic cholecystectomy, but deferred  4/25 s/p cholecystectomy along with significant lysis of adhesions on 4/25 here at [facility]. 5/12 admission to ICU for large abd abscess, jaundice and septic shock, on pressors 5/12 CT abd-Large abscess in the gallbladder fossa, at least 10 cm in size; Percutaneous drain placed by IR 5/13: Nephrology following, starting Bicarb gtt, remains on Levophed; Blood cultures growing Enterococcus faecalis,Bacteroides fragilis.  Pt extremely delirious and agitated, trying to leave to go home, interfering with medical treatment, plan to start Precedex. High risk for intubation due to severe Encephalopathy 5/14severe sepsis, high risk for intubation, on precedex,  5/14 INTUBATED AND LEFT TRIALYSIS CATHETER PLACED 5/16: Plan for SBT following Hemodialysis.  Add Fluconazole for intraabdominal coverage 5/17: Repeat CT Abdomen/Pelvis @ 09:00.  Plan for SBT and hopeful for extubation as pt following commands this morning. Levophed currently weaned off 5/18: Extubated yesterday, now on room air.  Remains encephalopathic, repeat CT head negative yesterday.  To get Hemodialysis today.   Micro Data:  09/25/2020: SARS-CoV-2 PCR>> negative 09/25/2020: Influenza PCR>> negative 09/25/2020: Blood culture x2>>Enterococcus faecalis,Bacteroides fragilis 09/25/2020: MRSA PCR>> negative 09/25/2020: Aerobic/anaerobic culture from abdominal abscess>> ENTEROCOCCUS FAECALIS, BACTEROIDES FRAGILIS, BETA LACTAMASE POSITIVE (sensitive to Ampicillin, Gentamycin, Vancomycin) 09/26/2020: Urine>>no growth 09/30/2020: Blood culture>>  Antimicrobials:  Zosyn 5/12>> Fluconazole 5/16>>   Antibiotics Given (last 72 hours)    Date/Time Action Medication Dose Rate   09/30/20 1129 New Bag/Given   piperacillin-tazobactam (ZOSYN) IVPB 2.25 g 2.25 g 100 mL/hr   09/30/20 1800 New Bag/Given    piperacillin-tazobactam (ZOSYN) IVPB 2.25 g 2.25 g 100 mL/hr   10/01/20 0019 New Bag/Given   piperacillin-tazobactam (ZOSYN) IVPB 2.25 g 2.25 g 100 mL/hr  10/01/20 0502 New Bag/Given   piperacillin-tazobactam (ZOSYN) IVPB 2.25 g 2.25 g 100 mL/hr   10/01/20 1300 New Bag/Given   piperacillin-tazobactam (ZOSYN) IVPB 2.25 g 2.25 g 100 mL/hr   10/01/20 1705 New Bag/Given   piperacillin-tazobactam (ZOSYN) IVPB 2.25 g 2.25 g 100 mL/hr   10/01/20 2343 New Bag/Given   piperacillin-tazobactam (ZOSYN) IVPB 2.25 g 2.25 g 100 mL/hr   10/02/20 0508 New Bag/Given   piperacillin-tazobactam (ZOSYN) IVPB 2.25 g 2.25 g 100 mL/hr   10/02/20 1140 New Bag/Given   piperacillin-tazobactam (ZOSYN) IVPB 3.375 g 3.375 g 12.5 mL/hr   10/02/20 2244 New Bag/Given   Ampicillin-Sulbactam (UNASYN) 3 g in sodium chloride 0.9 % 100 mL IVPB 3 g 200 mL/hr      Interval History:  Pt slept overnight declined prn melatonin no acute events overnight.  Objective   Blood pressure 135/85, pulse 98, temperature 99.8 F (37.7 C), temperature source Axillary, resp. rate (!) 33, height 6' 3.98" (1.93 m), weight 111.2 kg, SpO2 95 %.        Intake/Output Summary (Last 24 hours) at 10/03/2020 0526 Last data filed at 10/03/2020 0500 Gross per 24 hour  Intake 817.33 ml  Output 1325 ml  Net -507.67 ml   Filed Weights   10/01/20 0451 10/02/20 0408 10/03/20 0445  Weight: 114 kg 114.6 kg 111.2 kg     REVIEW OF SYSTEMS Unable to assess due to altered mental status   PHYSICAL EXAMINATION: GENERAL:acutely ill appearing made, laying in bed, on room air, in NAD HEAD: Normocephalic EYES: Pupils equal, round, reactive to light. No scleral icterus.  MOUTH: MM dry NECK: Supple. No JVD present  PULMONARY: faint rhonchi throughout, even, non labored  CARDIOVASCULAR: irregular irregular, no R/G, 2+ radial/2+ distal pulses GASTROINTESTINAL: distended, +BS x4, non tender Percutaneous drain to RUQ is clean dry and intact    MUSCULOSKELETAL: No deformities. Generalized weakness. No swelling, clubbing, or edema.  NEUROLOGIC: Awake. Oriented to self only. Follows commands. PERRLA SKIN: Warm and dry.  No obvious rashes, lesions, or ulcerations  Labs/imaging that I havepersonally reviewed  (right click and "Reselect all SmartList Selections" daily)  Labs 10/01/2020: K 3.2, glucose 209, BUN 92, Cr. 5.65, albumin 2, WBC 18.9, Hgb 11.3, HCT 33.4 CT Abdomen & Pelvis 5/17>>1. Collection of gas and fluid in the gallbladder fossa with small locules of gas also tracking along the pigtail catheter drain along the peritoneal margin of the right anterior abdominal wall. Given that we are over 3 weeks out from cholecystectomy, this collection is abnormal and is compatible with abscess. As on prior exams, the adjacent hepatic flexure is somewhat indistinct with some spanning stranding, making it difficult to completely exclude a fistula between the gallbladder fossa region and the colon although no obvious linear collection of extraluminal gas is seen. 2. Third spacing of fluid with ascites, widespread mesenteric and subcutaneous edema. Retroperitoneal edema on the left. 3. Bibasilar atelectasis with small bilateral pleural effusions. 4.  Aortic Atherosclerosis (ICD10-I70.0).  Coronary atherosclerosis. 5. Multilevel thoracic and lumbar impingement. 6. Transverse, descending, and sigmoid colon diverticulosis. 7. Nonspecific sclerotic lesion in the left eighth rib laterally, no change from 2018, highly likely to be benign. 8. Potentially complex exophytic lesion from the left kidney upper pole, possibly a complex cyst although a small mass is not excluded. CT Head w/o contrast 5/17>>No CT evidence of acute intracranial abnormality. Stable non-contrast CT appearance of the brain as compared to 09/25/2020.Mild cerebral atrophy and chronic small vessel ischemic disease. Labs 10/03/2020: alk  phos 168, wbc 14.7, hgb 11.5    ASSESSMENT/PLAN   Acute Hypoxic Respiratory Failure in the setting of severe metabolic derangements due to septic shock, AKI, and Metabolic Encephalopathy -Extubated 09/30/20 -Supplemental O2 as needed to maintain O2 sats >92% -Follow intermittent CXR  -Prn Bronchodilators -Pulmonary toilet  Septic Shock~resolved Atrial fibrillation with rvr  Echocardiogram 09/27/20 with LVEF 60-65%, grade II diastolic dysfunction, RV systolic function normal; normal valves & no vegetations -Continuous cardiac monitoring -Maintain MAP greater than 65 -Continue amiodarone gtt for now   Severe sepsis due to intra-abdominal abscess and BACTEREMIA (Enterococcus faecalis, Bacteroides fragilis) -Monitor fever curve -Trend WBCs and procalcitonin -Follow cultures as above -ID consulted appreciate input~per recommendations abx adjusted to ampicillin/sulbactam per sensitivities and continue fluconazole 05/19 -Status post Percutaneous drain placement by IR into gallbladder fossa abscess on 09/25/2020 for source control -General surgery following, appreciate input -Drain adjustment per IR on 10/02/2020  Acute Kidney Injury Anion Gap Metabolic Acidosis -Trend BMP  -Avoid nephrotoxic medications as able -Replace electrolytes as indicated -Nephrology following, appreciate input -HD as per Nephrology  Acute toxic metabolic encephalopathy, suspect in setting of Sepsis, AKI, and sleep deprivation~Urine drug screen + for benzodiazepines 09/26/2020 -Provide supportive care -Melatonin qhs prn and promote normal sleep/wake cycle -Avoid sedating meds as able -CT Head 09/30/20 negative -Urine drug screen + for Benzodiazepines  Anemia without s/sx obvious bleeding  -Monitor for S/Sx of bleeding -Trend CBC -SCD's for VTE Prophylaxis  -Transfuse for Hgb <7  Hyperglycemia -CBG's -SSI -Follow ICU Hypo/Hyperglycemia protocol.  Best practice (right click and "Reselect all SmartList Selections" daily)  Diet: NPO,  tube feeds Pain/Anxiety/Delirium protocol (if indicated): N/A VAP protocol (if indicated): N/A DVT prophylaxis: Heparin SQ GI prophylaxis: PPI Glucose control:  SSI, Lantus Central venous access:  Yes, and still needed Arterial line:  N/A Foley:  Yes, and still needed Mobility:  OOB as tolerated PT consulted: N/A Code Status:  full code Disposition: ICU   Labs   CBC: Recent Labs  Lab 09/27/20 0416 09/28/20 0418 09/29/20 0456 09/30/20 0514 10/01/20 0346 10/02/20 0305 10/03/20 0356  WBC 29.2* 36.9* 27.0* 19.3* 18.9* 21.6* 14.7*  NEUTROABS 26.0* 32.5* 23.2*  --   --   --   --   HGB 10.7* 11.5* 10.5* 9.6* 11.3* 12.3* 11.5*  HCT 30.7* 33.4* 30.1* 28.7* 33.4* 36.6* 34.1*  MCV 77.9* 79.1* 78.0* 79.5* 79.7* 79.9* 80.4  PLT 465* 562* 475* 373 381 379 016    Basic Metabolic Panel: Recent Labs  Lab 09/28/20 0418 09/29/20 0456 09/30/20 0514 10/01/20 0340 10/01/20 0346 10/01/20 2236 10/02/20 0305  NA 134* 131* 135 136  --   --  139  K 4.3 3.8 3.5 3.2*  --  3.1* 3.3*  CL 93* 87* 94* 95*  --   --  100  CO2 25 28 29 27   --   --  26  GLUCOSE 230* 228* 148* 209*  --   --  197*  BUN 74* 86* 81* 92*  --   --  64*  CREATININE 5.42* 6.13* 5.49* 5.65*  --   --  4.40*  CALCIUM 7.1* 6.7* 7.0* 7.1*  --   --  7.3*  MG 2.4 2.5* 2.4  --  2.4  --  2.2  PHOS 8.8* 8.6* 6.8* 6.6*  --   --  3.8   GFR: Estimated Creatinine Clearance: 22.9 mL/min (A) (by C-G formula based on SCr of 4.4 mg/dL (H)). Recent Labs  Lab 09/27/20 0416 09/28/20 0418 09/29/20 0456  09/30/20 0514 10/01/20 0346 10/02/20 0305 10/03/20 0356  PROCALCITON 99.94 56.30  --   --   --   --   --   WBC 29.2* 36.9*   < > 19.3* 18.9* 21.6* 14.7*   < > = values in this interval not displayed.    Liver Function Tests: Recent Labs  Lab 09/27/20 0416 09/28/20 0418 09/29/20 0456 09/30/20 0514 10/01/20 0340 10/02/20 0305  AST 100* 91* 55*  --   --   --   ALT 48* 53* 42  --   --   --   ALKPHOS 279* 255* 213*  --   --    --   BILITOT 3.9* 2.9* 2.2*  --   --   --   PROT 6.8 6.3* 5.8*  --   --   --   ALBUMIN 2.0* 1.9* 1.8* 1.9* 2.0* 2.0*   No results for input(s): LIPASE, AMYLASE in the last 168 hours. Recent Labs  Lab 09/30/20 1751  AMMONIA 20    ABG    Component Value Date/Time   PHART 7.35 09/27/2020 1155   PCO2ART 42 09/27/2020 1155   PO2ART 71 (L) 09/27/2020 1155   HCO3 23.2 09/27/2020 1155   ACIDBASEDEF 2.3 (H) 09/27/2020 1155   O2SAT 93.1 09/27/2020 1155     Coagulation Profile: No results for input(s): INR, PROTIME in the last 168 hours.  Cardiac Enzymes: No results for input(s): CKTOTAL, CKMB, CKMBINDEX, TROPONINI in the last 168 hours.  HbA1C: Hemoglobin A1C  Date/Time Value Ref Range Status  12/27/2019 12:00 AM 6.9  Final    Comment:    [facility]   Hgb A1c MFr Bld  Date/Time Value Ref Range Status  09/26/2020 04:20 AM 6.7 (H) 4.8 - 5.6 % Final    Comment:    (NOTE) Pre diabetes:          5.7%-6.4%  Diabetes:              >6.4%  Glycemic control for   <7.0% adults with diabetes   05/30/2020 10:47 AM 7.2 (H) <5.7 % of total Hgb Final    Comment:    For someone without known diabetes, a hemoglobin A1c value of 6.5% or greater indicates that they may have  diabetes and this should be confirmed with a follow-up  test. . For someone with known diabetes, a value <7% indicates  that their diabetes is well controlled and a value  greater than or equal to 7% indicates suboptimal  control. A1c targets should be individualized based on  duration of diabetes, age, comorbid conditions, and  other considerations. . Currently, no consensus exists regarding use of hemoglobin A1c for diagnosis of diabetes for children. .     CBG: Recent Labs  Lab 10/02/20 1135 10/02/20 1527 10/02/20 1950 10/03/20 0011 10/03/20 0437  GLUCAP 167* 174* 213* 251* 224*     Past Medical History:  He,  has a past medical history of Cancer ([place]), Erectile dysfunction associated  with type 2 diabetes mellitus ([place]) (05/26/2016), Gross hematuria (11/16/2016), Hypertension (06/11/2015), and Type 2 diabetes mellitus with diabetic polyneuropathy, without long-term current use of insulin ([place]) (06/11/2015).   Surgical History:   Past Surgical History:  Procedure Laterality Date  . boil lanceted     hand  . COLONOSCOPY WITH PROPOFOL N/A 07/03/2020   Procedure: COLONOSCOPY WITH PROPOFOL;  Surgeon: Jonathon Bellows, MD;  Location: [facility] ENDOSCOPY;  Service: Gastroenterology;  Laterality: N/A;  . CYSTOSCOPY W/ RETROGRADES Bilateral 12/06/2016  Procedure: CYSTOSCOPY WITH RETROGRADE PYELOGRAM;  Surgeon: Hollice Espy, MD;  Location: [facility] ORS;  Service: Urology;  Laterality: Bilateral;  . CYSTOSCOPY W/ RETROGRADES Bilateral 06/27/2017   Procedure: CYSTOSCOPY WITH RETROGRADE PYELOGRAM;  Surgeon: Hollice Espy, MD;  Location: [facility] ORS;  Service: Urology;  Laterality: Bilateral;  . CYSTOSCOPY WITH BIOPSY N/A 06/27/2017   Procedure: CYSTOSCOPY WITH Bladder BIOPSY;  Surgeon: Hollice Espy, MD;  Location: [facility] ORS;  Service: Urology;  Laterality: N/A;  . CYSTOSCOPY WITH BIOPSY N/A 10/24/2017   Procedure: CYSTOSCOPY WITH Bladder BIOPSY;  Surgeon: Hollice Espy, MD;  Location: [facility] ORS;  Service: Urology;  Laterality: N/A;  . ingrown  Bilateral    ingrown toenail  . IR BILIARY DRAIN PLACEMENT WITH CHOLANGIOGRAM  07/10/2020  . IR CHOLANGIOGRAM EXISTING TUBE  08/07/2020  . IR RADIOLOGIST EVAL & MGMT  08/07/2020  . TRANSURETHRAL RESECTION OF BLADDER TUMOR N/A 12/06/2016   Procedure: TRANSURETHRAL RESECTION OF BLADDER TUMOR (TURBT) (2-5cm) CLOT EVACUATION;  Surgeon: Hollice Espy, MD;  Location: [facility] ORS;  Service: Urology;  Laterality: N/A;  . TRANSURETHRAL RESECTION OF BLADDER TUMOR N/A 06/27/2017   Procedure: TRANSURETHRAL RESECTION OF BLADDER TUMOR (TURBT);  Surgeon: Hollice Espy, MD;  Location: [facility] ORS;  Service: Urology;  Laterality: N/A;  . TRANSURETHRAL RESECTION OF BLADDER TUMOR WITH  MITOMYCIN-C N/A 01/18/2017   Procedure: TRANSURETHRAL RESECTION OF BLADDER TUMOR WITH MITOMYCIN-C-(SMALL);  Surgeon: Hollice Espy, MD;  Location: [facility] ORS;  Service: Urology;  Laterality: N/A;     Social History:   reports that he has never smoked. He has never used smokeless tobacco. He reports that he does not drink alcohol and does not use drugs.   Family History:  His family history includes Heart attack in his maternal aunt, maternal grandfather, maternal grandmother, and mother; Heart disease in his maternal grandfather and mother; Parkinson's disease in his father. There is no history of Prostate cancer, Bladder Cancer, or Kidney cancer.   Allergies Allergies  Allergen Reactions  . Cyclobenzaprine Other (See Comments)    Not improving pain and makes him agressive.  . Lisinopril Cough     Home Medications  Prior to Admission medications   Medication Sig Start Date End Date Taking? Authorizing Provider  ibuprofen (ADVIL) 400 MG tablet Take 1 tablet (400 mg total) by mouth every 6 (six) hours as needed. 09/09/20  Yes Edison Simon R, PA-C  metoprolol tartrate (LOPRESSOR) 25 MG tablet Take 0.5 tablets (12.5 mg total) by mouth 2 (two) times daily. 09/19/20  Yes Karamalegos, Devonne Doughty, DO  oxyCODONE (OXY IR/ROXICODONE) 5 MG immediate release tablet Take 1 tablet (5 mg total) by mouth every 6 (six) hours as needed for severe pain or breakthrough pain. 09/09/20  Yes Edison Simon R, PA-C  pravastatin (PRAVACHOL) 20 MG tablet Take 1 tablet (20 mg total) by mouth daily. 05/30/20  Yes Karamalegos, Devonne Doughty, DO  zolpidem (AMBIEN) 5 MG tablet Take 1 tablet (5 mg total) by mouth at bedtime as needed for sleep. 08/19/20  Yes Karamalegos, Devonne Doughty, DO  acetaminophen (TYLENOL) 325 MG tablet Take 1 tablet (325 mg total) by mouth every 6 (six) hours as needed for mild pain, fever or headache (or Fever >/= 101). Patient not taking: No sig reported 07/12/20   Cherene Altes, MD   diphenhydramine-acetaminophen (TYLENOL PM) 25-500 MG TABS tablet Take 1 tablet by mouth at bedtime as needed. Patient not taking: No sig reported    [provider]  OZEMPIC, 0.25 OR 0.5 MG/DOSE, 2 MG/1.5ML SOPN Inject 1  mg into the skin once a week. Patient taking differently: Inject 1 mg into the skin every Thursday. 05/30/20   [name], DO       Marda Stalker, [place] Pager [phone] (please enter 7 digits) PCCM Consult Pager [phone] (please enter 7 digits)

## 2020-10-03 NOTE — TOC Progression Note (Signed)
Transition of Care [facility]) - Progression Note    Patient Details  Name: [name] MRN: [id number] Date of Birth: 02/25/1956  [facility] of [facility] CM/SW Contact  Kerin Salen, RN Phone Number: 10/03/2020, 2:01 PM  Clinical Narrative: Wyline Copas, patients daughter to discuss L-TAC preference and she declines to make decision at this time will discuss with her brother and other family members and call me back. Ione given my phone contact information.      Expected Discharge Plan: [place] Barriers to Discharge: Continued Medical Work up  Expected Discharge Plan and Services Expected Discharge Plan: [place] In-house Referral: Clinical Social Work   Post Acute Care Choice: [place] Living arrangements for the past 2 months: Single Family Home                                       Social Determinants of Health (SDOH) Interventions    Readmission Risk Interventions No flowsheet data found.

## 2020-10-03 NOTE — Evaluation (Signed)
Occupational Therapy Evaluation Patient Details Name: Richard Oconnell MRN: [id number] DOB: [birth year] Today's Date: 10/03/2020    History of Present Illness 65 yo male with previous dx of acute cholecystitis s/p cholecystotomy tube 2/24 and then follow up robotic assisted cholecystectomy 4/25, now presents with large abdominal abscess with severe septic shock and BACTEREMIA, with multiorgan failure (liver and renal failure) complicated by severe metabolic acidosis and toxic metabolic encephalopathy.   Clinical Impression   Patient presenting with decreased I in self care, balance, functional mobility/transfer, endurance, and safety awareness. Unsure of baseline as pt was unable to verbalize staff. No family present to determine baseline.  Patient having been incontinent of bowel and soiled self and bed linen needed +2 assistance for bed mobility and hygiene. Total +2 assist for supine >sit to EOB and total A of 2 to maintain balance with noted L lateral lean.  Patient will benefit from acute OT to increase overall independence in the areas of ADLs, functional mobility, and safety awareness in order to safely discharge to next venue of care.    Follow Up Recommendations  SNF;Supervision/Assistance - 24 hour    Equipment Recommendations  Other (comment) (defer to next venue of care)       Precautions / Restrictions Precautions Precautions: Fall Precaution Comments: temporary dialysis left IJ catheter, NPO, HOB >30 degrees Restrictions Weight Bearing Restrictions: No      Mobility Bed Mobility Overal bed mobility: Needs Assistance Bed Mobility: Rolling;Supine to Sit;Sit to Supine Rolling: Total assist;+2 for physical assistance   Supine to sit: Total assist;+2 for physical assistance;HOB elevated Sit to supine: Total assist;+2 for physical assistance;HOB elevated   General bed mobility comments: Pt requires encouragement & multimodal cuing to initiate supine>sitting EOB.     Transfers Overall transfer level:  (unsafe to attempt at this time)               General transfer comment: deferred secondary to safety    Balance Overall balance assessment: Needs assistance Sitting-balance support: Feet unsupported;Bilateral upper extremity supported Sitting balance-Leahy Scale: Poor   Postural control: Left lateral lean                                 ADL either performed or assessed with clinical judgement   ADL Overall ADL's : Needs assistance/impaired     Grooming: Wash/dry face;Total assistance Grooming Details (indicate cue type and reason): hand over hand assist                               General ADL Comments: max - total A for all aspects of self care. +2 assist for rolling to assist pt with hygiene after BM     Vision Patient Visual Report: No change from baseline              Pertinent Vitals/Pain Pain Assessment: Faces Faces Pain Scale: Hurts whole lot Pain Location: scrotum during peri care Pain Descriptors / Indicators: Sharp Pain Intervention(s): Limited activity within patient's tolerance;Repositioned     Hand Dominance Right   Extremity/Trunk Assessment Upper Extremity Assessment Upper Extremity Assessment: Generalized weakness   Lower Extremity Assessment Lower Extremity Assessment: Generalized weakness       Communication Communication Communication: Other (comment) (decreased volume)   Cognition Arousal/Alertness: Awake/alert Behavior During Therapy:  (irritable) Overall Cognitive Status: No family/caregiver present to determine baseline cognitive functioning  General Comments: Pt oriented to self & being in hospital but states he's in [place] in [place]. PT attempts to reorient pt during session. Pt irritable, frequently cursing & stating staff is not qualified to care for him.   General Comments  Pt on room air during session,  lowest SPO2 87% but question accuracy 2/2 inconsistent pleth waveform            Home Living Family/patient expects to be discharged to:: Private residence                                        Prior Functioning/Environment          Comments: Pt unable to provide PLOF nor home set up information. PT attempted to contact pt's sister & brother via telephone to obtain information but without success. Will f/u as able.        OT Problem List: Decreased strength;Impaired balance (sitting and/or standing);Pain;Decreased safety awareness;Cardiopulmonary status limiting activity;Decreased activity tolerance;Decreased knowledge of use of DME or AE      OT Treatment/Interventions: Self-care/ADL training;Manual therapy;Therapeutic exercise;Patient/family education;Modalities;Balance training;Energy conservation;Therapeutic activities;DME and/or AE instruction;Cognitive remediation/compensation    OT Goals(Current goals can be found in the care plan section) Acute Rehab OT Goals Patient Stated Goal: none stated Time For Goal Achievement: 10/17/20 Potential to Achieve Goals: Good ADL Goals Pt Will Perform Grooming: with min assist;sitting Pt Will Transfer to Toilet: with mod assist;ambulating;bedside commode Pt Will Perform Toileting - Clothing Manipulation and hygiene: with mod assist;sit to/from stand Pt/caregiver will Perform Home Exercise Program: Increased ROM;Increased strength;Both right and left upper extremity;With theraband;With theraputty;With minimal assist;With written HEP provided  OT Frequency: Min 2X/week   Barriers to D/C:    unsure of family support       Co-evaluation PT/OT/SLP Co-Evaluation/Treatment: Yes Reason for Co-Treatment: Complexity of the patient's impairments (multi-system involvement);Necessary to address cognition/behavior during functional activity;For patient/therapist safety PT goals addressed during session: Mobility/safety with  mobility OT goals addressed during session: ADL's and self-care      AM-PAC OT "6 Clicks" Daily Activity     Outcome Measure Help from another person eating meals?: A Lot Help from another person taking care of personal grooming?: Total Help from another person toileting, which includes using toliet, bedpan, or urinal?: Total Help from another person bathing (including washing, rinsing, drying)?: Total Help from another person to put on and taking off regular upper body clothing?: Total Help from another person to put on and taking off regular lower body clothing?: Total 6 Click Score: 7   End of Session Nurse Communication: Mobility status  Activity Tolerance: Treatment limited secondary to agitation;Patient limited by fatigue Patient left: in bed;with call bell/phone within reach;with bed alarm set  OT Visit Diagnosis: Unsteadiness on feet (R26.81);Muscle weakness (generalized) (M62.81)                Time: 1340-1402 OT Time Calculation (min): 22 min Charges:  OT General Charges $OT Visit: 1 Visit OT Evaluation $OT Eval High Complexity: 1 High  [address], [place], OTR/L , [facility] ascom [phone]  10/03/20, 4:26 PM

## 2020-10-03 NOTE — Evaluation (Addendum)
Physical Therapy Evaluation Patient Details Name: Richard Oconnell MRN: [id number] DOB: 03/20/1956 Today's Date: 10/03/2020   History of Present Illness  65 yo male with previous dx of acute cholecystitis s/p cholecystotomy tube 2/24 and then follow up robotic assisted cholecystectomy 4/25, now presents with large abdominal abscess with severe septic shock and BACTEREMIA, with multiorgan failure (liver and renal failure) complicated by severe metabolic acidosis and toxic metabolic encephalopathy.  Clinical Impression  Pt seen for PT evaluation with co-tx with OT for pt & therapist's safety. Pt noted to be incontinent of BM & requires total +2-3 assist for rolling L<>R with multimodal cuing for sequencing & technique. Pt noted to have scrotal edema & erythema. Pt irritable during session, frequently cursing at staff & stating staff isn't qualified to assist him. Pt is oriented to self & location (hospital). Pt requires total assist +2 for supine<>sit and max assist for static sitting EOB. Pt demonstrates slightly more strength in RUE & RLE compared to [place]. Unsafe to attempt transfers at this time & pt also noting fatigue after sitting EOB so assisted to supine. Will continue to follow pt acutely to increase independence & progress transfers & gait as safe & able. Pt would benefit from STR upon d/c to maximize independence with functional mobility & reduce fall risk prior to return home.     Follow Up Recommendations SNF    Equipment Recommendations   (TBD in next venue)    Recommendations for Other Services       Precautions / Restrictions Precautions Precautions: Fall Precaution Comments: temporary dialysis left IJ catheter, NPO, HOB >30 degrees Restrictions Weight Bearing Restrictions: No      Mobility  Bed Mobility Overal bed mobility: Needs Assistance Bed Mobility: Rolling;Supine to Sit;Sit to Supine Rolling: Total assist;+2 for physical assistance   Supine to sit: Total  assist;+2 for physical assistance;HOB elevated Sit to supine: Total assist;+2 for physical assistance;HOB elevated   General bed mobility comments: Pt requires encouragement & multimodal cuing to initiate supine>sitting EOB.    Transfers Overall transfer level:  (unsafe to attempt at this time)                  Ambulation/Gait                Stairs            Wheelchair Mobility    Modified Rankin (Stroke Patients Only)       Balance Overall balance assessment: Needs assistance Sitting-balance support: Feet unsupported;Bilateral upper extremity supported Sitting balance-Leahy Scale: Poor   Postural control: Left lateral lean                                   Pertinent Vitals/Pain Pain Assessment: Faces Faces Pain Scale: Hurts whole lot Pain Location: scrotum during peri care Pain Descriptors / Indicators: Sharp Pain Intervention(s): Monitored during session;Repositioned    Home Living Family/patient expects to be discharged to:: Private residence                      Prior Function           Comments: Pt unable to provide PLOF nor home set up information. PT attempted to contact pt's sister & brother via telephone to obtain information but without success. Will f/u as able.     Hand Dominance   Dominant Hand: Right    Extremity/Trunk Assessment  Upper Extremity Assessment Upper Extremity Assessment: Generalized weakness;Defer to OT evaluation    Lower Extremity Assessment Lower Extremity Assessment: Generalized weakness (2/5 R knee extension in sitting, 1/5 L knee extension in sitting)       Communication   Communication:  (decreased volume with expressive communcation)  Cognition Arousal/Alertness: Awake/alert Behavior During Therapy:  (irritable) Overall Cognitive Status: No family/caregiver present to determine baseline cognitive functioning                                 General  Comments: Pt oriented to self & being in hospital but states he's in [place] in [place]. PT attempts to reorient pt during session. Pt irritable, frequently cursing & stating staff is not qualified to care for him.      General Comments General comments (skin integrity, edema, etc.): Pt on room air during session, lowest SPO2 87% but question accuracy 2/2 inconsistent pleth waveform    Exercises     Assessment/Plan    PT Assessment Patient needs continued PT services  PT Problem List Decreased strength;Decreased mobility;Decreased safety awareness;Decreased range of motion;Decreased activity tolerance;Decreased knowledge of precautions;Decreased balance;Decreased knowledge of use of DME;Decreased cognition;Cardiopulmonary status limiting activity;Obesity       PT Treatment Interventions DME instruction;Therapeutic activities;Modalities;Cognitive remediation;Gait training;Therapeutic exercise;Patient/family education;Stair training;Wheelchair mobility training;Balance training;Functional mobility training;Neuromuscular re-education;Manual techniques    PT Goals (Current goals can be found in the Care Plan section)  Acute Rehab PT Goals PT Goal Formulation: Patient unable to participate in goal setting    Frequency Min 2X/week   Barriers to discharge Decreased caregiver support;Inaccessible home environment unsure of home set up nor if pt has support upon d/c    Co-evaluation PT/OT/SLP Co-Evaluation/Treatment: Yes Reason for Co-Treatment: Complexity of the patient's impairments (multi-system involvement);Necessary to address cognition/behavior during functional activity;For patient/therapist safety PT goals addressed during session: Mobility/safety with mobility;Balance         AM-PAC PT "6 Clicks" Mobility  Outcome Measure Help needed turning from your back to your side while in a flat bed without using bedrails?: Total Help needed moving from lying on your back to sitting  on the side of a flat bed without using bedrails?: Total Help needed moving to and from a bed to a chair (including a wheelchair)?: Total Help needed standing up from a chair using your arms (e.g., wheelchair or bedside chair)?: Total Help needed to walk in hospital room?: Total Help needed climbing 3-5 steps with a railing? : Total 6 Click Score: 6    End of Session   Activity Tolerance: Patient limited by fatigue (irritable) Patient left: in bed;with call bell/phone within reach;with bed alarm set (all four extremities on pillows)   PT Visit Diagnosis: Unsteadiness on feet (R26.81);Muscle weakness (generalized) (M62.81);Difficulty in walking, not elsewhere classified (R26.2)    Time: 7062-3762 PT Time Calculation (min) (ACUTE ONLY): 17 min   Charges:   PT Evaluation $PT Eval Moderate Complexity: [place], PT, DPT 10/03/20, 3:05 PM   Waunita Schooner 10/03/2020, 2:54 PM

## 2020-10-03 NOTE — Progress Notes (Signed)
Paroxysmal Atrial Fibrillation/Flutter with RVR Patient self-transitioning between junctional rhythm dropping his HR into the 40's, and NSR. Looking back patient did have a run of self-sustained A-Flutter around 04:00 on 10/03/20. Discussed the case with Dr. Patsey Berthold.  - Due to patient's high risk for bleeding with liver dysfunction and large abscess, will hold off on systemic anticoagulation - check PT/INR in AM - d/c amiodarone in the setting of new junctional rhythm - consider diltiazem if A-fib RVR develops - consult cardiology, appreciate input - plan conveyed with care RN  Domingo Pulse [place], AGACNP-BC Acute Care Nurse Practitioner [place]   [phone] / [phone] Please see Amion for pager details.

## 2020-10-03 NOTE — Progress Notes (Addendum)
Went into patient room to do shift assessment and give insulin. Patient stated that he wants to be transferred to [facility] in [place]. I explained to the patient that my job is to care for him and keep him safe. I also explained that I can pass on that he would like to be transferred but if the transfer were to happen it would not be tonight.   Patient said "Thank you for trying to help now back the f**k off". I asked the patient if I could give him his insulin and he refused. The patient than began to take off the dressing to his trialysis catheter. I told the patient that he cannot do that for his safety which made him more agitated. Patient then tried to pull out his left peripheral IV. I asked the patient to stop, pulled his hand away and asked for help. Patient then attempted to bite me and swing at me. Patient attempted to scratch me. It took myself plus two nurses to give the patient Haldol and to place mittens on the patient.   The haldol was affective and patient is now sleeping.   Patient heart rate is now going back and forth from the 40's to 60's. NP notified, EKG will be obtained with 2200 medications. Blood pressure WDL.   Will notify patients brother of event and will continue patient care.   Update 10/04/2020 0645   Patient slept for a few hours after receiving haldol. Patient woke up and started to swing legs off of the bed stating that he was going to call a friend to come get him. Explained to patient that it was 4am and that he needed to stay in bed. Patient eventually gave up trying to get out of bed. Nurse tech Hollie Beach went into patients room to get blood sugar but patient began to swing at her. Patient than calmed down and I was able to obtain blood sugar while the phlebotomist was at the bedside. Patient is now sleeping.

## 2020-10-03 NOTE — Progress Notes (Signed)
[facility], [place] 10/03/20  Subjective:   Hospital day # 8  Patient is critically ill A-fib on monitor Amiodarone  Tube feeds at 65 ml/hr Renal: >2L output, Foley in place  05/19 0701 - 05/20 0700 In: 750.7 [I.V.:382.4; IV Piggyback:368.2] Out: 2050 [Urine:2000; Drains:50] Lab Results  Component Value Date   CREATININE 4.55 (H) 10/03/2020   CREATININE 4.40 (H) 10/02/2020   CREATININE 5.65 (H) 10/01/2020    following simple commands   Objective:  Vital signs in last 24 hours:  Temp:  [99 F (37.2 C)-100.6 F (38.1 C)] 99.2 F (37.3 C) (05/20 0845) Pulse Rate:  [79-118] 89 (05/20 0845) Resp:  [24-39] 32 (05/20 0845) BP: (122-156)/(72-99) 122/74 (05/20 0800) SpO2:  [90 %-100 %] 100 % (05/20 0845) Weight:  [111.2 kg] 111.2 kg (05/20 0445)  Weight change: -3.4 kg Filed Weights   10/01/20 0451 10/02/20 0408 10/03/20 0445  Weight: 114 kg 114.6 kg 111.2 kg    Intake/Output:    Intake/Output Summary (Last 24 hours) at 10/03/2020 0935 Last data filed at 10/03/2020 5364 Gross per 24 hour  Intake 710.46 ml  Output 1750 ml  Net -1039.54 ml    Physical Exam: General:  No acute distress, laying in the bed  HEENT  NGT in place, mouth and tongue dry  Pulm/lungs  Rhonchi breath sounds , tachypneic  CVS/Heart  regular rhythm, no rub or gallop  Abdomen:   Soft, nontender, gall bladder drain in place  Extremities:  No peripheral edema  Neurologic:  follows simple commands  Skin:  No acute rashes   foley in place Left IJ temporary cath 09/27/20- ICU team  Basic Metabolic Panel:  Recent Labs  Lab 09/29/20 0456 09/30/20 0514 10/01/20 0340 10/01/20 0346 10/01/20 2236 10/02/20 0305 10/03/20 0356  NA 131* 135 136  --   --  139 143  K 3.8 3.5 3.2*  --  3.1* 3.3* 3.4*  CL 87* 94* 95*  --   --  100 105  CO2 $Re'28 29 27  'VEF$ --   --  26 25  GLUCOSE 228* 148* 209*  --   --  197* 263*  BUN 86* 81* 92*  --   --  64* 67*  CREATININE 6.13* 5.49*  5.65*  --   --  4.40* 4.55*  CALCIUM 6.7* 7.0* 7.1*  --   --  7.3* 7.5*  MG 2.5* 2.4  --  2.4  --  2.2 2.2  PHOS 8.6* 6.8* 6.6*  --   --  3.8 4.7*     CBC: Recent Labs  Lab 09/27/20 0416 09/28/20 0418 09/29/20 0456 09/30/20 0514 10/01/20 0346 10/02/20 0305 10/03/20 0356  WBC 29.2* 36.9* 27.0* 19.3* 18.9* 21.6* 14.7*  NEUTROABS 26.0* 32.5* 23.2*  --   --   --   --   HGB 10.7* 11.5* 10.5* 9.6* 11.3* 12.3* 11.5*  HCT 30.7* 33.4* 30.1* 28.7* 33.4* 36.6* 34.1*  MCV 77.9* 79.1* 78.0* 79.5* 79.7* 79.9* 80.4  PLT 465* 562* 475* 373 381 379 316      Lab Results  Component Value Date   HEPBSAG NON REACTIVE 09/26/2020   HEPBSAB NON REACTIVE 09/26/2020   HEPBIGM NON REACTIVE 09/26/2020      Microbiology:  Recent Results (from the past 240 hour(s))  Culture, blood (routine x 2)     Status: Abnormal   Collection Time: 09/25/20  2:41 PM   Specimen: BLOOD  Result Value Ref Range Status   Specimen Description  Final    BLOOD RIGHT ANTECUBITAL Performed at [facility], [address] [place], [place] [address]    Special Requests   Final    BOTTLES DRAWN AEROBIC AND ANAEROBIC Blood Culture results may not be optimal due to an excessive volume of blood received in culture bottles Performed at [facility], [address] [place], [place] [address]    Culture  Setup Time   Final    Organism ID to follow GRAM POSITIVE COCCI IN BOTH AEROBIC AND ANAEROBIC BOTTLES CRITICAL RESULT CALLED TO, READ BACK BY AND VERIFIED WITH: LISA KLUTTZ $RemoveBef'[email or web address]$  ON 09/26/20.Queets Performed at [facility], [address] [name], [place] [address]    Culture ENTEROCOCCUS FAECALIS (A)  Final   Report Status 09/28/2020 FINAL  Final   Organism ID, Bacteria ENTEROCOCCUS FAECALIS  Final      Susceptibility   Enterococcus faecalis - MIC*    AMPICILLIN <=2 SENSITIVE Sensitive     VANCOMYCIN 1 SENSITIVE Sensitive     GENTAMICIN SYNERGY SENSITIVE Sensitive     * ENTEROCOCCUS  FAECALIS  Blood Culture ID Panel (Reflexed)     Status: Abnormal   Collection Time: 09/25/20  2:41 PM  Result Value Ref Range Status   Enterococcus faecalis DETECTED (A) NOT DETECTED Final    Comment: CRITICAL RESULT CALLED TO, READ BACK BY AND VERIFIED WITH: LISA KLUTTZ $[email or web address]$  ON 09/26/20.SH    Enterococcus Faecium NOT DETECTED NOT DETECTED Final   Listeria monocytogenes NOT DETECTED NOT DETECTED Final   Staphylococcus species NOT DETECTED NOT DETECTED Final   Staphylococcus aureus (BCID) NOT DETECTED NOT DETECTED Final   Staphylococcus epidermidis NOT DETECTED NOT DETECTED Final   Staphylococcus lugdunensis NOT DETECTED NOT DETECTED Final   Streptococcus species NOT DETECTED NOT DETECTED Final   Streptococcus agalactiae NOT DETECTED NOT DETECTED Final   Streptococcus pneumoniae NOT DETECTED NOT DETECTED Final   Streptococcus pyogenes NOT DETECTED NOT DETECTED Final   A.calcoaceticus-baumannii NOT DETECTED NOT DETECTED Final   Bacteroides fragilis DETECTED (A) NOT DETECTED Final    Comment: CRITICAL RESULT CALLED TO, READ BACK BY AND VERIFIED WITH: LISA KLUTTZ $RemoveBef'[email or web address]$  ON 09/26/20.SH    Enterobacterales NOT DETECTED NOT DETECTED Final   Enterobacter cloacae complex NOT DETECTED NOT DETECTED Final   Escherichia coli NOT DETECTED NOT DETECTED Final   Klebsiella aerogenes NOT DETECTED NOT DETECTED Final   Klebsiella oxytoca NOT DETECTED NOT DETECTED Final   Klebsiella pneumoniae NOT DETECTED NOT DETECTED Final   Proteus species NOT DETECTED NOT DETECTED Final   Salmonella species NOT DETECTED NOT DETECTED Final   Serratia marcescens NOT DETECTED NOT DETECTED Final   Haemophilus influenzae NOT DETECTED NOT DETECTED Final   Neisseria meningitidis NOT DETECTED NOT DETECTED Final   Pseudomonas aeruginosa NOT DETECTED NOT DETECTED Final   Stenotrophomonas maltophilia NOT DETECTED NOT DETECTED Final   Candida albicans NOT DETECTED NOT DETECTED Final   Candida auris NOT DETECTED NOT DETECTED  Final   Candida glabrata NOT DETECTED NOT DETECTED Final   Candida krusei NOT DETECTED NOT DETECTED Final   Candida parapsilosis NOT DETECTED NOT DETECTED Final   Candida tropicalis NOT DETECTED NOT DETECTED Final   Cryptococcus neoformans/gattii NOT DETECTED NOT DETECTED Final   Vancomycin resistance NOT DETECTED NOT DETECTED Final    Comment: Performed at [facility], [address] [place], [place] [address]  Culture, blood (routine x 2)     Status: None   Collection Time: 09/25/20  3:42 PM   Specimen: BLOOD  Result Value Ref Range Status   Specimen Description BLOOD BLOOD LEFT HAND  Final   Special Requests   Final    BOTTLES DRAWN AEROBIC AND ANAEROBIC Blood Culture adequate volume   Culture   Final    NO GROWTH 5 DAYS Performed at [facility], [address] [place], [place] [address]    Report Status 09/30/2020 FINAL  Final  Resp Panel by RT-PCR (Flu A&B, Covid) Nasopharyngeal Swab     Status: None   Collection Time: 09/25/20  3:51 PM   Specimen: Nasopharyngeal Swab; Nasopharyngeal(NP) swabs in vial transport medium  Result Value Ref Range Status   SARS Coronavirus 2 by RT PCR NEGATIVE NEGATIVE Final    Comment: (NOTE) SARS-CoV-2 target nucleic acids are NOT DETECTED.  The SARS-CoV-2 RNA is generally detectable in upper respiratory specimens during the acute phase of infection. The lowest concentration of SARS-CoV-2 viral copies this assay can detect is 138 copies/mL. A negative result does not preclude SARS-Cov-2 infection and should not be used as the sole basis for treatment or other patient management decisions. A negative result may occur with  improper specimen collection/handling, submission of specimen other than nasopharyngeal swab, presence of viral mutation(s) within the areas targeted by this assay, and inadequate number of viral copies(<138 copies/mL). A negative result must be combined with clinical observations, patient history, and  epidemiological information. The expected result is Negative.  Fact Sheet for Patients:  EntrepreneurPulse.com.au  Fact Sheet for Healthcare Providers:  IncredibleEmployment.be  This test is no t yet approved or cleared by the [place] FDA and  has been authorized for detection and/or diagnosis of SARS-CoV-2 by FDA under an Emergency Use Authorization (EUA). This EUA will remain  in effect (meaning this test can be used) for the duration of the COVID-19 declaration under Section 564(b)(1) of the Act, 21 U.S.C.section 360bbb-3(b)(1), unless the authorization is terminated  or revoked sooner.       Influenza A by PCR NEGATIVE NEGATIVE Final   Influenza B by PCR NEGATIVE NEGATIVE Final    Comment: (NOTE) The Xpert Xpress SARS-CoV-2/FLU/RSV plus assay is intended as an aid in the diagnosis of influenza from Nasopharyngeal swab specimens and should not be used as a sole basis for treatment. Nasal washings and aspirates are unacceptable for Xpert Xpress SARS-CoV-2/FLU/RSV testing.  Fact Sheet for Patients: EntrepreneurPulse.com.au  Fact Sheet for Healthcare Providers: IncredibleEmployment.be  This test is not yet approved or cleared by the [place] FDA and has been authorized for detection and/or diagnosis of SARS-CoV-2 by FDA under an Emergency Use Authorization (EUA). This EUA will remain in effect (meaning this test can be used) for the duration of the COVID-19 declaration under Section 564(b)(1) of the Act, 21 U.S.C. section 360bbb-3(b)(1), unless the authorization is terminated or revoked.  Performed at [facility], [address] [place], [place] [address]   MRSA PCR Screening     Status: None   Collection Time: 09/25/20  6:44 PM   Specimen: Nasopharyngeal  Result Value Ref Range Status   MRSA by PCR NEGATIVE NEGATIVE Final    Comment:        The GeneXpert MRSA Assay  (FDA approved for NASAL specimens only), is one component of a comprehensive MRSA colonization surveillance program. It is not intended to diagnose MRSA infection nor to guide or monitor treatment for MRSA infections. Performed at [facility], [address] [place], [place] [address]   Aerobic/Anaerobic Culture (surgical/deep wound)  Status: None   Collection Time: 09/25/20  9:02 PM   Specimen: Abscess  Result Value Ref Range Status   Specimen Description   Final    ABSCESS Performed at [facility], [address] [place], [place] [address]    Special Requests   Final    Normal Performed at [facility], [place], [place] [address]    Gram Stain   Final    ABUNDANT WBC PRESENT,BOTH PMN AND MONONUCLEAR MODERATE GRAM NEGATIVE COCCOBACILLI    Culture   Final    ABUNDANT ENTEROCOCCUS FAECALIS MODERATE BACTEROIDES FRAGILIS BETA LACTAMASE POSITIVE Performed at [facility], [place] [address], [place], [place] [address]    Report Status 09/28/2020 FINAL  Final   Organism ID, Bacteria ENTEROCOCCUS FAECALIS  Final      Susceptibility   Enterococcus faecalis - MIC*    AMPICILLIN <=2 SENSITIVE Sensitive     VANCOMYCIN 1 SENSITIVE Sensitive     GENTAMICIN SYNERGY SENSITIVE Sensitive     * ABUNDANT ENTEROCOCCUS FAECALIS  Urine culture     Status: None   Collection Time: 09/26/20  1:36 PM   Specimen: Urine, Random  Result Value Ref Range Status   Specimen Description   Final    URINE, RANDOM Performed at [facility], [address], [place], [place] [address]    Special Requests   Final    NONE Performed at [facility], [address], [place], [place] [address]    Culture   Final    NO GROWTH Performed at [facility], [place] [address] [place], [place] [address]    Report Status 09/28/2020 FINAL  Final  Culture, blood (single) w Reflex to ID Panel     Status: None (Preliminary result)    Collection Time: 09/30/20  3:35 PM   Specimen: BLOOD  Result Value Ref Range Status   Specimen Description BLOOD BLOOD RIGHT HAND  Final   Special Requests   Final    BOTTLES DRAWN AEROBIC AND ANAEROBIC Blood Culture adequate volume   Culture   Final    NO GROWTH 3 DAYS Performed at [facility], [address], [place], [place] [address]    Report Status PENDING  Incomplete    Coagulation Studies: No results for input(s): LABPROT, INR in the last 72 hours.  Urinalysis: No results for input(s): COLORURINE, LABSPEC, PHURINE, GLUCOSEU, HGBUR, BILIRUBINUR, KETONESUR, PROTEINUR, UROBILINOGEN, NITRITE, LEUKOCYTESUR in the last 72 hours.  Invalid input(s): APPERANCEUR    Imaging: CT IMAGE GUIDED DRAINAGE BY PERCUTANEOUS CATHETER  Result Date: 10/02/2020 CLINICAL DATA:  Previous cholecystectomy, with postop abscess, status post percutaneous drain catheter placement 09/25/2020. Follow-up CT demonstrated incomplete resolution of collection with catheter at the margin of the residual collection. Drain revision requested. EXAM: CT GUIDED REVISION OF PERITONEAL DRAIN CATHETER ANESTHESIA/SEDATION: 1% lidocaine subcutaneous PROCEDURE: The procedure, risks, benefits, and alternatives were explained to the patient's sibling. Questions regarding the procedure were encouraged and answered. The brother understands and consents to the procedure. Select axial scans through the upper abdomen were obtained. The drain catheter entry site and surrounding skin were prepped with chlorhexidinein a sterile fashion, and a sterile drape was applied covering the operative field. A sterile gown and sterile gloves were used for the procedure. Local anesthesia was provided with 1% Lidocaine. The retention catheter was cut. The previously placed catheter was partially withdrawn, position confirmed under CT fluoroscopy. Catheter was then cut and exchanged over a stiff Amplatz  wire for a new 14 French multi sidehole  pigtail drain catheter, formed and position within the central aspect of the residual 7 patent collection. CT fluoroscopy confirms appropriate positioning. 20 mL of purulent material were aspirated. The catheter was secured externally with 0 Prolene suture and StatLock, and placed to gravity drain bag with good return of additional purulent contents. The patient tolerated the procedure well. COMPLICATIONS: None immediate FINDINGS: The previously placed drain catheter was at the inferior margin of the residual subhepatic complex gas and fluid collection. The catheter was exchanged for a 14 French device placed centrally within the residual collection with good return of purulent material. IMPRESSION: 1. Technically successful exchange and revision of right upper quadrant peritoneal abscess drain catheter. Electronically Signed   By: Lucrezia Europe M.D.   On: 10/02/2020 11:57     Medications:   . sodium chloride Stopped (09/25/20 1647)  . sodium chloride    . sodium chloride 250 mL (10/03/20 0635)  . amiodarone 30 mg/hr (10/03/20 0500)  . ampicillin-sulbactam (UNASYN) IV Stopped (10/02/20 2314)  . feeding supplement (VITAL 1.5 CAL) 1,000 mL (10/03/20 0419)  . fentaNYL infusion INTRAVENOUS Stopped (09/30/20 1423)  . fluconazole (DIFLUCAN) IV 0 mg (10/02/20 2350)  . norepinephrine (LEVOPHED) Adult infusion Stopped (09/30/20 0800)   . chlorhexidine gluconate (MEDLINE KIT)  15 mL Mouth Rinse BID  . Chlorhexidine Gluconate Cloth  6 each Topical Daily  . docusate  100 mg Per Tube BID  . feeding supplement (PROSource TF)  45 mL Per Tube Daily  . free water  30 mL Per Tube Q4H  . heparin injection (subcutaneous)  5,000 Units Subcutaneous Q8H  . insulin aspart  0-20 Units Subcutaneous Q4H  . insulin glargine  10 Units Subcutaneous QHS  . mouth rinse  15 mL Mouth Rinse 10 times per day  . multivitamin  1 tablet Per Tube QHS  . pantoprazole (PROTONIX) IV  40 mg Intravenous Q24H  . polyethylene glycol  17 g  Per Tube Daily  . sodium chloride flush  3 mL Intravenous Q12H  . sodium chloride flush  5 mL Intracatheter Q8H  . sodium chloride flush  5 mL Intracatheter Q8H   sodium chloride, acetaminophen, albuterol, docusate, fentaNYL (SUBLIMAZE) injection, haloperidol lactate, melatonin, ondansetron (ZOFRAN) IV, polyethylene glycol, sodium chloride flush  Assessment/ Plan:  65 y.o. male with hypertension, diabetes mellitus type II, bladder cancer, admitted on 09/25/2020 for Septic shock (HCC) [A41.9, R65.21] Altered mental status, unspecified altered mental status type [R41.82] Postprocedural intraabdominal abscess [T81.43XA] Sepsis with acute renal failure and septic shock, due to unspecified organism, unspecified acute renal failure type ([place]) [A41.9, R65.21, N17.9]   # AKI  baseline creatinine of 1.1, GFR >60 on 08/19/20.  Lab Results  Component Value Date   CREATININE 4.55 (H) 10/03/2020   CREATININE 4.40 (H) 10/02/2020   CREATININE 5.65 (H) 10/01/2020   05/19 0701 - 05/20 0700 In: 750.7 [I.V.:382.4; IV Piggyback:368.2] Out: 2050 [Urine:2000; Drains:50]  AKI likely secondary to ATN from sepsis Improved UOP   Tolerated HD well on 5/16, 5/18  Re-assess daily for need of HD.  No acute indication today  # Sepsis, Gall bladder Abscess, S/p robotic assisted Lap Chole 09/08/2020 S/p CT guided GB abscess drain placement by VIR, on 5/12 Blood culture positive for Bacteroides and enterococcus on 5/12 - Off pressors - zosyn, fluconazole   # hyperphosphatemia -in setting of renal failure  - Expected to correct with HD Lab Results  Component Value Date   CALCIUM  7.5 (L) 10/03/2020   PHOS 4.7 (H) 10/03/2020  Phosphorus above goal  # Acute resp failure intubated 09/27/20-09/30/2020 Improving   LOS: [place] 5/20/20229:35 [place], [place]

## 2020-10-03 NOTE — Progress Notes (Signed)
Pharmacy Antibiotic Note  Richard Oconnell is a 65 y.o. male admitted on 09/25/2020. Pharmacy has been consulted for ampicillin/sulbactam.  Patient with cholecystitis in February s/p perc drain placement then lap chole on 4/25. Surgical drain removed 5/3 and presents with sepsis.  IR placed drain 5/12 and repositioned 5/19 with increased output.  Blood culture and drain culture with E. Faecalis and bacteroides. Patient previously on piperacillin/tazobactam  Today, 10/03/2020 Day #8 antibiotics, Day #5 fluconazole  5/17 repeat Blood cx are NGTD, cleared E. Faecalis and bacteroides  Renal: IHD as needed, SCr stable,  Having UOP  AST and ALT WNL  WBC improved   Tm 100.6  Plan:  Continue ampicillin/sulbactam 3gm IV q12h based on current renal function.   Follow renal function closely,  Getting IHD PRN  Continue fluconazole - check LFTs  Height: 6' 3.98" (193 cm) Weight: 111.2 kg (245 lb 2.4 oz) IBW/kg (Calculated) : 86.76  Temp (24hrs), Avg:99.7 F (37.6 C), Min:99 F (37.2 C), Max:100.6 F (38.1 C)  Recent Labs  Lab 09/29/20 0456 09/30/20 0514 10/01/20 0340 10/01/20 0346 10/02/20 0305 10/03/20 0356  WBC 27.0* 19.3*  --  18.9* 21.6* 14.7*  CREATININE 6.13* 5.49* 5.65*  --  4.40* 4.55*    Estimated Creatinine Clearance: 22.1 mL/min (A) (by C-G formula based on SCr of 4.55 mg/dL (H)).    Allergies  Allergen Reactions  . Cyclobenzaprine Other (See Comments)    Not improving pain and makes him agressive.  . Lisinopril Cough    Antimicrobials this admission: Zosyn 5/12 >> 5/19 Amp/sulb 5/19 >>   Microbiology results: 5/12 BCID/Bcx: E faecalis and B frag 5/12 abscess culture: E.faecalis, B frag 5/12 MRSA PCR: negative  Thank you for allowing pharmacy to be a part of this patient's care.  Doreene Eland, PharmD, BCPS.   Work Cell: [phone] 10/03/2020 3:27 PM

## 2020-10-03 NOTE — Progress Notes (Addendum)
[place] Hospital Day(s): 8.   Interval History:  Patient seen and examined He did have fever to 100.56F at 2000 last night, afebrile this morning Atrial fibrillation remains controlled with amiodarone, rate controlled Patient more alert and interactive this morning, still tangential and disoriented His leukocytosis made significant improvement this morning; down to 14.7K Renal function remains elevated, no significant change, sCr - 4.55; UO - 2.0L Persistent hypokalemia to 3.4 Drain exchanged with IR yesterday (05/19); 50 ccs out; seropurulent Cx from drain placement growing enterococcus faecalis; switched to Unasyn (05/19) Started on tube feedings yesterday (05/16); tolerating   Vital signs in last 24 hours: [min-max] current  Temp:  [99 F (37.2 C)-100.6 F (38.1 C)] 99.8 F (37.7 C) (05/20 0400) Pulse Rate:  [83-118] 98 (05/20 0500) Resp:  [24-39] 33 (05/20 0500) BP: (131-156)/(72-99) 135/85 (05/20 0400) SpO2:  [90 %-96 %] 95 % (05/20 0500) Weight:  [111.2 kg] 111.2 kg (05/20 0445)     Height: 6' 3.98" (193 cm) Weight: 111.2 kg BMI (Calculated): 29.85   Intake/Output last 2 shifts:  05/19 0701 - 05/20 0700 In: 750.7 [I.V.:382.4; IV Piggyback:368.2] Out: 2050 [Urine:2000; Drains:50]   Physical Exam:  Constitutional: opens eyes to voice; more alert, still somewhat tangential and disoriented  HEENT: NGT in place, poor dentition  Respiratory: breathing non-labored at rest  Cardiovascular: irregularly irregular rhythm, rate controlled   Gastrointestinal: Soft, non-tender, and non-distended, no rebound/guarding. percutaneous drain in the RUQ with seropurulent output, now to gravity  Genitourinary: Foley in place Integumentary: warm, dry  Labs:  CBC Latest Ref Rng & Units 10/03/2020 10/02/2020 10/01/2020  WBC 4.0 - 10.5 K/uL 14.7(H) 21.6(H) 18.9(H)  Hemoglobin 13.0 - 17.0 g/dL 11.5(L) 12.3(L) 11.3(L)  Hematocrit 39.0 - 52.0 % 34.1(L)  36.6(L) 33.4(L)  Platelets 150 - 400 K/uL 316 379 381   CMP Latest Ref Rng & Units 10/03/2020 10/02/2020 10/01/2020  Glucose 70 - 99 mg/dL 263(H) 197(H) -  BUN 8 - 23 mg/dL 67(H) 64(H) -  Creatinine 0.61 - 1.24 mg/dL 4.55(H) 4.40(H) -  Sodium 135 - 145 mmol/L 143 139 -  Potassium 3.5 - 5.1 mmol/L 3.4(L) 3.3(L) 3.1(L)  Chloride 98 - 111 mmol/L 105 100 -  CO2 22 - 32 mmol/L 25 26 -  Calcium 8.9 - 10.3 mg/dL 7.5(L) 7.3(L) -  Total Protein 6.5 - 8.1 g/dL 5.6(L) - -  Total Bilirubin 0.3 - 1.2 mg/dL 1.2 - -  Alkaline Phos 38 - 126 U/L 168(H) - -  AST 15 - 41 U/L 39 - -  ALT 0 - 44 U/L 24 - -     Imaging studies: No new pertinent imaging studies   Assessment/Plan:  65 y.o. male with severe sepsis, AKI, and encephalopathy secondary to gallbladder fossa abscess 23 days s/p robotic assisted laparoscopic cholecystectomy for severe cholecystitis               - Maintain percutaneous drain; flush per IR recommendations (at least 5 ccs NS daily); monitor and record output  - Okay to continue enteric feedings via NGT; evaluate for ability to tolerate PO             - Continue IV Abx (Zsoyn); Cx with enterococcus faecalis; sensitivities reviewed  - Monitor fever curve; suspect fever overnight related to drain exchange             - Monitor leukocytosis; improved             - Monitor renal  function; stable; nephrology on board              - Further management per primary service; we will follow      All of the above findings and recommendations were discussed with the patient, and the medical team.  -- Edison Simon, PA-C [facility] 10/03/2020, 7:16 AM [phone] M-F: 7am - 4pm  I saw and evaluated the patient.  I agree with the above documentation, exam, and plan, which I have edited where appropriate. [name]  9:18 AM

## 2020-10-04 ENCOUNTER — Inpatient Hospital Stay: Payer: Medicare Other

## 2020-10-04 DIAGNOSIS — R4182 Altered mental status, unspecified: Secondary | ICD-10-CM | POA: Diagnosis not present

## 2020-10-04 DIAGNOSIS — A419 Sepsis, unspecified organism: Secondary | ICD-10-CM | POA: Diagnosis not present

## 2020-10-04 DIAGNOSIS — R6521 Severe sepsis with septic shock: Secondary | ICD-10-CM | POA: Diagnosis not present

## 2020-10-04 DIAGNOSIS — K750 Abscess of liver: Secondary | ICD-10-CM | POA: Diagnosis not present

## 2020-10-04 LAB — CBC
HCT: 30.1 % — ABNORMAL LOW (ref 39.0–52.0)
Hemoglobin: 10 g/dL — ABNORMAL LOW (ref 13.0–17.0)
MCH: 27.5 pg (ref 26.0–34.0)
MCHC: 33.2 g/dL (ref 30.0–36.0)
MCV: 82.7 fL (ref 80.0–100.0)
Platelets: 273 10*3/uL (ref 150–400)
RBC: 3.64 MIL/uL — ABNORMAL LOW (ref 4.22–5.81)
RDW: 17.7 % — ABNORMAL HIGH (ref 11.5–15.5)
WBC: 14.6 10*3/uL — ABNORMAL HIGH (ref 4.0–10.5)
nRBC: 0 % (ref 0.0–0.2)

## 2020-10-04 LAB — GLUCOSE, CAPILLARY
Glucose-Capillary: 147 mg/dL — ABNORMAL HIGH (ref 70–99)
Glucose-Capillary: 149 mg/dL — ABNORMAL HIGH (ref 70–99)
Glucose-Capillary: 178 mg/dL — ABNORMAL HIGH (ref 70–99)
Glucose-Capillary: 189 mg/dL — ABNORMAL HIGH (ref 70–99)
Glucose-Capillary: 191 mg/dL — ABNORMAL HIGH (ref 70–99)
Glucose-Capillary: 219 mg/dL — ABNORMAL HIGH (ref 70–99)

## 2020-10-04 LAB — RENAL FUNCTION PANEL
Albumin: 1.9 g/dL — ABNORMAL LOW (ref 3.5–5.0)
Anion gap: 11 (ref 5–15)
BUN: 73 mg/dL — ABNORMAL HIGH (ref 8–23)
CO2: 27 mmol/L (ref 22–32)
Calcium: 7.7 mg/dL — ABNORMAL LOW (ref 8.9–10.3)
Chloride: 106 mmol/L (ref 98–111)
Creatinine, Ser: 3.99 mg/dL — ABNORMAL HIGH (ref 0.61–1.24)
GFR, Estimated: 16 mL/min — ABNORMAL LOW (ref >60)
Glucose, Bld: 190 mg/dL — ABNORMAL HIGH (ref 70–99)
Phosphorus: 4.3 mg/dL (ref 2.5–4.6)
Potassium: 3 mmol/L — ABNORMAL LOW (ref 3.5–5.1)
Sodium: 144 mmol/L (ref 135–145)

## 2020-10-04 LAB — APTT: aPTT: 38 seconds — ABNORMAL HIGH (ref 24–36)

## 2020-10-04 LAB — PROTIME-INR
INR: 1.2 (ref 0.8–1.2)
Prothrombin Time: 14.7 seconds (ref 11.4–15.2)

## 2020-10-04 LAB — MAGNESIUM: Magnesium: 2.3 mg/dL (ref 1.7–2.4)

## 2020-10-04 MED ORDER — POTASSIUM CHLORIDE 10 MEQ/100ML IV SOLN
10.0000 meq | INTRAVENOUS | Status: AC
  Administered 2020-10-04 (×3): 10 meq via INTRAVENOUS
  Filled 2020-10-04 (×3): qty 100

## 2020-10-04 NOTE — Progress Notes (Signed)
[name], male   DOB: 02/11/1956, 65 y.o.   MRN: [id number]     [facility] Day(s): 9.   Post op day(s):  Marland Kitchen   Interval History: Patient seen and examined, no acute events or new complaints overnight. Patient reports having no pain.  History from patient is not reliable due to encephalopathy.  Patient does follow simple commands.  As per nurse there has been no clinical deterioration or any issues overnight.  Vital signs in last 24 hours: [min-max] current  Temp:  [97.5 F (36.4 C)-99.6 F (37.6 C)] 99.6 F (37.6 C) (05/20 2000) Pulse Rate:  [64-85] 64 (05/20 2000) Resp:  [9-34] 21 (05/20 2000) BP: (124-134)/(65-84) 134/65 (05/20 2000) SpO2:  [94 %-100 %] 97 % (05/20 2000) Weight:  [107.1 kg] 107.1 kg (05/21 0442)     Height: 6' 3.98" (193 cm) Weight: 107.1 kg BMI (Calculated): 28.75   Physical Exam:  Constitutional: alert, acutely ill Respiratory: breathing non-labored at rest  Cardiovascular: Irregular rhythm Gastrointestinal: soft, non-tender, and non-distended  Labs:  CBC Latest Ref Rng & Units 10/04/2020 10/03/2020 10/02/2020  WBC 4.0 - 10.5 K/uL 14.6(H) 14.7(H) 21.6(H)  Hemoglobin 13.0 - 17.0 g/dL 10.0(L) 11.5(L) 12.3(L)  Hematocrit 39.0 - 52.0 % 30.1(L) 34.1(L) 36.6(L)  Platelets 150 - 400 K/uL 273 316 379   CMP Latest Ref Rng & Units 10/04/2020 10/03/2020 10/02/2020  Glucose 70 - 99 mg/dL 190(H) 263(H) 197(H)  BUN 8 - 23 mg/dL 73(H) 67(H) 64(H)  Creatinine 0.61 - 1.24 mg/dL 3.99(H) 4.55(H) 4.40(H)  Sodium 135 - 145 mmol/L 144 143 139  Potassium 3.5 - 5.1 mmol/L 3.0(L) 3.4(L) 3.3(L)  Chloride 98 - 111 mmol/L 106 105 100  CO2 22 - 32 mmol/L 27 25 26   Calcium 8.9 - 10.3 mg/dL 7.7(L) 7.5(L) 7.3(L)  Total Protein 6.5 - 8.1 g/dL - 5.6(L) -  Total Bilirubin 0.3 - 1.2 mg/dL - 1.2 -  Alkaline Phos 38 - 126 U/L - 168(H) -  AST 15 - 41 U/L - 39 -  ALT 0 - 44 U/L - 24 -    Imaging studies: No new pertinent imaging  studies   Assessment/Plan:  65 y.o. male with sepsis due to intra-abdominal abscess s/p percutaneous drainage, complicated by pertinent comorbidities including type 2 diabetes, hypertension, hyperlipidemia.  Patient without any clinical deterioration overnight.  Patient not on any pressors.  The white blood cell continue to be stable at 14.6.  There has been no fever in the last 24 hours.  Patient tolerated to feedings.  Patient had a bowel movement.  From the surgical standpoint I think that the patient is responding adequately to the exchange of the percutaneous drainage.  With the source control with this drain the patient should be able to start recovering slowly.  I discussed with the family that they wish to be a very slow process.  No surgical intervention is needed at this moment we will continue to follow closely.  Appreciate PCCM and consultants for the management of this critically ill patient.   Arnold Long, MD

## 2020-10-04 NOTE — Progress Notes (Signed)
Richard Oconnell, [place] 10/04/20  Subjective:   Hospital day # 9  Patient is critically ill Resting comfortably Opens eyes but unable to communicate  Does follow simple commands    05/20 0701 - 05/21 0700 In: 2182.7 [I.V.:367.6; NG/GT:1309.3; IV Piggyback:505.8] Out: 2440 [Urine:2400; Drains:40] Lab Results  Component Value Date   CREATININE 3.99 (H) 10/04/2020   CREATININE 4.55 (H) 10/03/2020   CREATININE 4.40 (H) 10/02/2020      Objective:  Vital signs in last 24 hours:  Temp:  [97.5 F (36.4 C)-99.6 F (37.6 C)] 99.6 F (37.6 C) (05/20 2000) Pulse Rate:  [64-85] 64 (05/20 2000) Resp:  [9-34] 21 (05/20 2000) BP: (115-134)/(65-84) 134/65 (05/20 2000) SpO2:  [94 %-100 %] 97 % (05/20 2000) Weight:  [107.1 kg] 107.1 kg (05/21 0442)  Weight change: -4.1 kg Filed Weights   10/02/20 0408 10/03/20 0445 10/04/20 0442  Weight: 114.6 kg 111.2 kg 107.1 kg    Intake/Output:    Intake/Output Summary (Last 24 hours) at 10/04/2020 0941 Last data filed at 10/04/2020 0600 Gross per 24 hour  Intake 2182.73 ml  Output 2440 ml  Net -257.27 ml    Physical Exam: General:  No acute distress, laying in the bed  HEENT  NGT in place, mouth and tongue dry  Pulm/lungs  Rhonchi breath sounds , no distress   CVS/Heart  regular rhythm, no rub or gallop  Abdomen:   Soft, nontender, gall bladder drain in place  Extremities:  No peripheral edema  Neurologic:  follows simple commands  Skin:  No acute rashes   foley in place Left IJ temporary cath 09/27/20- ICU team  Basic Metabolic Panel:  Recent Labs  Lab 09/30/20 0514 10/01/20 0340 10/01/20 0346 10/01/20 2236 10/02/20 0305 10/03/20 0356 10/04/20 0627  NA 135 136  --   --  139 143 144  K 3.5 3.2*  --  3.1* 3.3* 3.4* 3.0*  CL 94* 95*  --   --  100 105 106  CO2 29 27  --   --  _0 GLUCOSE 148* 209*  --   --  197* 263* 190*  BUN 81* 92*  --   --  64* 67* 73*  CREATININE 5.49* 5.65*  --   --   4.40* 4.55* 3.99*  CALCIUM 7.0* 7.1*  --   --  7.3* 7.5* 7.7*  MG 2.4  --  2.4  --  2.2 2.2 2.3  PHOS 6.8* 6.6*  --   --  3.8 4.7* 4.3     CBC: Recent Labs  Lab 09/28/20 0418 09/29/20 0456 09/30/20 0514 10/01/20 0346 10/02/20 0305 10/03/20 0356 10/04/20 0627  WBC 36.9* 27.0* 19.3* 18.9* 21.6* 14.7* 14.6*  NEUTROABS 32.5* 23.2*  --   --   --   --   --   HGB 11.5* 10.5* 9.6* 11.3* 12.3* 11.5* 10.0*  HCT 33.4* 30.1* 28.7* 33.4* 36.6* 34.1* 30.1*  MCV 79.1* 78.0* 79.5* 79.7* 79.9* 80.4 82.7  PLT 562* 475* 373 381 379 316 273      Lab Results  Component Value Date   HEPBSAG NON REACTIVE 09/26/2020   HEPBSAB NON REACTIVE 09/26/2020   HEPBIGM NON REACTIVE 09/26/2020      Microbiology:  Recent Results (from the past 240 hour(s))  Culture, blood (routine x 2)     Status: Abnormal   Collection Time: 09/25/20  2:41 PM   Specimen: BLOOD  Result Value Ref Range Status   Specimen Description  Final    BLOOD RIGHT ANTECUBITAL Performed at [facility], [address] [place], [place] [address]    Special Requests   Final    BOTTLES DRAWN AEROBIC AND ANAEROBIC Blood Culture results may not be optimal due to an excessive volume of blood received in culture bottles Performed at [facility], [address] [place], [place] [address]    Culture  Setup Time   Final    Organism ID to follow GRAM POSITIVE COCCI IN BOTH AEROBIC AND ANAEROBIC BOTTLES CRITICAL RESULT CALLED TO, READ BACK BY AND VERIFIED WITH: LISA KLUTTZ _0  ON 09/26/20.[place] Performed at [facility], [address] [place], [place] [address]    Culture ENTEROCOCCUS FAECALIS (A)  Final   Report Status 09/28/2020 FINAL  Final   Organism ID, Bacteria ENTEROCOCCUS FAECALIS  Final      Susceptibility   Enterococcus faecalis - MIC*    AMPICILLIN <=2 SENSITIVE Sensitive     VANCOMYCIN 1 SENSITIVE Sensitive     GENTAMICIN SYNERGY SENSITIVE Sensitive     * ENTEROCOCCUS FAECALIS   Blood Culture ID Panel (Reflexed)     Status: Abnormal   Collection Time: 09/25/20  2:41 PM  Result Value Ref Range Status   Enterococcus faecalis DETECTED (A) NOT DETECTED Final    Comment: CRITICAL RESULT CALLED TO, READ BACK BY AND VERIFIED WITH: LISA KLUTTZ _1  ON 09/26/20.SH    Enterococcus Faecium NOT DETECTED NOT DETECTED Final   Listeria monocytogenes NOT DETECTED NOT DETECTED Final   Staphylococcus species NOT DETECTED NOT DETECTED Final   Staphylococcus aureus (BCID) NOT DETECTED NOT DETECTED Final   Staphylococcus epidermidis NOT DETECTED NOT DETECTED Final   Staphylococcus lugdunensis NOT DETECTED NOT DETECTED Final   Streptococcus species NOT DETECTED NOT DETECTED Final   Streptococcus agalactiae NOT DETECTED NOT DETECTED Final   Streptococcus pneumoniae NOT DETECTED NOT DETECTED Final   Streptococcus pyogenes NOT DETECTED NOT DETECTED Final   A.calcoaceticus-baumannii NOT DETECTED NOT DETECTED Final   Bacteroides fragilis DETECTED (A) NOT DETECTED Final    Comment: CRITICAL RESULT CALLED TO, READ BACK BY AND VERIFIED WITH: LISA KLUTTZ _2  ON 09/26/20.SH    Enterobacterales NOT DETECTED NOT DETECTED Final   Enterobacter cloacae complex NOT DETECTED NOT DETECTED Final   Escherichia coli NOT DETECTED NOT DETECTED Final   Klebsiella aerogenes NOT DETECTED NOT DETECTED Final   Klebsiella oxytoca NOT DETECTED NOT DETECTED Final   Klebsiella pneumoniae NOT DETECTED NOT DETECTED Final   Proteus species NOT DETECTED NOT DETECTED Final   Salmonella species NOT DETECTED NOT DETECTED Final   Serratia marcescens NOT DETECTED NOT DETECTED Final   Haemophilus influenzae NOT DETECTED NOT DETECTED Final   Neisseria meningitidis NOT DETECTED NOT DETECTED Final   Pseudomonas aeruginosa NOT DETECTED NOT DETECTED Final   Stenotrophomonas maltophilia NOT DETECTED NOT DETECTED Final   Candida albicans NOT DETECTED NOT DETECTED Final   Candida auris NOT DETECTED NOT DETECTED Final    Candida glabrata NOT DETECTED NOT DETECTED Final   Candida krusei NOT DETECTED NOT DETECTED Final   Candida parapsilosis NOT DETECTED NOT DETECTED Final   Candida tropicalis NOT DETECTED NOT DETECTED Final   Cryptococcus neoformans/gattii NOT DETECTED NOT DETECTED Final   Vancomycin resistance NOT DETECTED NOT DETECTED Final    Comment: Performed at [facility], [address] [place], [place] [address]  Culture, blood (routine x 2)     Status: None   Collection Time: 09/25/20  3:42 PM   Specimen: BLOOD  Result Value Ref Range Status   Specimen Description BLOOD BLOOD LEFT HAND  Final   Special Requests   Final    BOTTLES DRAWN AEROBIC AND ANAEROBIC Blood Culture adequate volume   Culture   Final    NO GROWTH 5 DAYS Performed at [facility], [address] [place], [place] [address]    Report Status 09/30/2020 FINAL  Final  Resp Panel by RT-PCR (Flu A&B, Covid) Nasopharyngeal Swab     Status: None   Collection Time: 09/25/20  3:51 PM   Specimen: Nasopharyngeal Swab; Nasopharyngeal(NP) swabs in vial transport medium  Result Value Ref Range Status   SARS Coronavirus 2 by RT PCR NEGATIVE NEGATIVE Final    Comment: (NOTE) SARS-CoV-2 target nucleic acids are NOT DETECTED.  The SARS-CoV-2 RNA is generally detectable in upper respiratory specimens during the acute phase of infection. The lowest concentration of SARS-CoV-2 viral copies this assay can detect is 138 copies/mL. A negative result does not preclude SARS-Cov-2 infection and should not be used as the sole basis for treatment or other patient management decisions. A negative result may occur with  improper specimen collection/handling, submission of specimen other than nasopharyngeal swab, presence of viral mutation(s) within the areas targeted by this assay, and inadequate number of viral copies(<138 copies/mL). A negative result must be combined with clinical observations, patient history, and  epidemiological information. The expected result is Negative.  Fact Sheet for Patients:  EntrepreneurPulse.com.au  Fact Sheet for Healthcare Providers:  IncredibleEmployment.be  This test is no t yet approved or cleared by the [place] FDA and  has been authorized for detection and/or diagnosis of SARS-CoV-2 by FDA under an Emergency Use Authorization (EUA). This EUA will remain  in effect (meaning this test can be used) for the duration of the COVID-19 declaration under Section 564(b)(1) of the Act, 21 U.S.C.section 360bbb-3(b)(1), unless the authorization is terminated  or revoked sooner.       Influenza A by PCR NEGATIVE NEGATIVE Final   Influenza B by PCR NEGATIVE NEGATIVE Final    Comment: (NOTE) The Xpert Xpress SARS-CoV-2/FLU/RSV plus assay is intended as an aid in the diagnosis of influenza from Nasopharyngeal swab specimens and should not be used as a sole basis for treatment. Nasal washings and aspirates are unacceptable for Xpert Xpress SARS-CoV-2/FLU/RSV testing.  Fact Sheet for Patients: EntrepreneurPulse.com.au  Fact Sheet for Healthcare Providers: IncredibleEmployment.be  This test is not yet approved or cleared by the [place] FDA and has been authorized for detection and/or diagnosis of SARS-CoV-2 by FDA under an Emergency Use Authorization (EUA). This EUA will remain in effect (meaning this test can be used) for the duration of the COVID-19 declaration under Section 564(b)(1) of the Act, 21 U.S.C. section 360bbb-3(b)(1), unless the authorization is terminated or revoked.  Performed at [facility], [address] [place], [place] [address]   MRSA PCR Screening     Status: None   Collection Time: 09/25/20  6:44 PM   Specimen: Nasopharyngeal  Result Value Ref Range Status   MRSA by PCR NEGATIVE NEGATIVE Final    Comment:        The GeneXpert MRSA Assay  (FDA approved for NASAL specimens only), is one component of a comprehensive MRSA colonization surveillance program. It is not intended to diagnose MRSA infection nor to guide or monitor treatment for MRSA infections. Performed at [facility], [address] [place],  [address]   Aerobic/Anaerobic Culture (surgical/deep wound)  Status: None   Collection Time: 09/25/20  9:02 PM   Specimen: Abscess  Result Value Ref Range Status   Specimen Description   Final    ABSCESS Performed at [facility], [address], [place], [place] [address]    Special Requests   Final    Normal Performed at [facility], [place], [place] [address]    Gram Stain   Final    ABUNDANT WBC PRESENT,BOTH PMN AND MONONUCLEAR MODERATE GRAM NEGATIVE COCCOBACILLI    Culture   Final    ABUNDANT ENTEROCOCCUS FAECALIS MODERATE BACTEROIDES FRAGILIS BETA LACTAMASE POSITIVE Performed at [facility], [place] [address] [place], [place] [address]    Report Status 09/28/2020 FINAL  Final   Organism ID, Bacteria ENTEROCOCCUS FAECALIS  Final      Susceptibility   Enterococcus faecalis - MIC*    AMPICILLIN <=2 SENSITIVE Sensitive     VANCOMYCIN 1 SENSITIVE Sensitive     GENTAMICIN SYNERGY SENSITIVE Sensitive     * ABUNDANT ENTEROCOCCUS FAECALIS  Urine culture     Status: None   Collection Time: 09/26/20  1:36 PM   Specimen: Urine, Random  Result Value Ref Range Status   Specimen Description   Final    URINE, RANDOM Performed at [facility], [address], [place], [place] [address]    Special Requests   Final    NONE Performed at [facility], [address], [place], [place] [address]    Culture   Final    NO GROWTH Performed at [facility], [place] [address], [place], [place] [address]    Report Status 09/28/2020 FINAL  Final  Culture, blood (single) w Reflex to ID Panel     Status: None (Preliminary result)    Collection Time: 09/30/20  3:35 PM   Specimen: BLOOD  Result Value Ref Range Status   Specimen Description BLOOD BLOOD RIGHT HAND  Final   Special Requests   Final    BOTTLES DRAWN AEROBIC AND ANAEROBIC Blood Culture adequate volume   Culture   Final    NO GROWTH 4 DAYS Performed at [facility], [address], [place], [place] [address]    Report Status PENDING  Incomplete    Coagulation Studies: Recent Labs    10/04/20 0627  LABPROT 14.7  INR 1.2    Urinalysis: No results for input(s): COLORURINE, LABSPEC, PHURINE, GLUCOSEU, HGBUR, BILIRUBINUR, KETONESUR, PROTEINUR, UROBILINOGEN, NITRITE, LEUKOCYTESUR in the last 72 hours.  Invalid input(s): APPERANCEUR    Imaging: CT IMAGE GUIDED DRAINAGE BY PERCUTANEOUS CATHETER  Result Date: 10/02/2020 CLINICAL DATA:  Previous cholecystectomy, with postop abscess, status post percutaneous drain catheter placement 09/25/2020. Follow-up CT demonstrated incomplete resolution of collection with catheter at the margin of the residual collection. Drain revision requested. EXAM: CT GUIDED REVISION OF PERITONEAL DRAIN CATHETER ANESTHESIA/SEDATION: 1% lidocaine subcutaneous PROCEDURE: The procedure, risks, benefits, and alternatives were explained to the patient's sibling. Questions regarding the procedure were encouraged and answered. The brother understands and consents to the procedure. Select axial scans through the upper abdomen were obtained. The drain catheter entry site and surrounding skin were prepped with chlorhexidinein a sterile fashion, and a sterile drape was applied covering the operative field. A sterile gown and sterile gloves were used for the procedure. Local anesthesia was provided with 1% Lidocaine. The retention catheter was cut. The previously placed catheter was partially withdrawn, position confirmed under CT fluoroscopy. Catheter was then cut and exchanged  over a stiff Amplatz wire for a new 14 French multi sidehole  pigtail drain catheter, formed and position within the central aspect of the residual 7 patent collection. CT fluoroscopy confirms appropriate positioning. 20 mL of purulent material were aspirated. The catheter was secured externally with 0 Prolene suture and StatLock, and placed to gravity drain bag with good return of additional purulent contents. The patient tolerated the procedure well. COMPLICATIONS: None immediate FINDINGS: The previously placed drain catheter was at the inferior margin of the residual subhepatic complex gas and fluid collection. The catheter was exchanged for a 14 French device placed centrally within the residual collection with good return of purulent material. IMPRESSION: 1. Technically successful exchange and revision of right upper quadrant peritoneal abscess drain catheter. Electronically Signed   By: Lucrezia Europe M.D.   On: 10/02/2020 11:57     Medications:   . sodium chloride Stopped (09/25/20 1647)  . sodium chloride    . sodium chloride 5 mL/hr at 10/04/20 0300  . ampicillin-sulbactam (UNASYN) IV Stopped (10/03/20 2246)  . feeding supplement (VITAL 1.5 CAL) 1,000 mL (10/04/20 0014)  . fluconazole (DIFLUCAN) IV Stopped (10/04/20 0029)   . chlorhexidine gluconate (MEDLINE KIT)  15 mL Mouth Rinse BID  . Chlorhexidine Gluconate Cloth  6 each Topical Daily  . docusate  100 mg Per Tube BID  . feeding supplement (PROSource TF)  45 mL Per Tube Daily  . free water  30 mL Per Tube Q4H  . heparin injection (subcutaneous)  5,000 Units Subcutaneous Q8H  . insulin aspart  0-20 Units Subcutaneous Q4H  . insulin glargine  15 Units Subcutaneous QHS  . mouth rinse  15 mL Mouth Rinse 10 times per day  . multivitamin  1 tablet Per Tube QHS  . pantoprazole (PROTONIX) IV  40 mg Intravenous Q24H  . polyethylene glycol  17 g Per Tube Daily  . sodium chloride flush  3 mL Intravenous Q12H  . sodium chloride flush  5 mL Intracatheter Q8H  . sodium chloride flush  5 mL Intracatheter  Q8H   sodium chloride, acetaminophen, albuterol, docusate, fentaNYL (SUBLIMAZE) injection, haloperidol lactate, melatonin, ondansetron (ZOFRAN) IV, polyethylene glycol, sodium chloride flush  Assessment/ Plan:  65 y.o. male with hypertension, diabetes mellitus type II, bladder cancer, admitted on 09/25/2020 for Septic shock ([place]) [A41.9, R65.21] Altered mental status, unspecified altered mental status type [R41.82] Postprocedural intraabdominal abscess [T81.43XA] Sepsis with acute renal failure and septic shock, due to unspecified organism, unspecified acute renal failure type ([place]) [A41.9, R65.21, N17.9]   #Renal Patient admitted with AKI AKI likely secondary to ATN from sepsis  Patient has baseline creatinine of 1.1, GFR >60 on 08/19/20. Patient had a peak creatinine of 6.13 on May 16. Patient did have an earlier episode of AKI in February admission as well when patient's peak creatinine was 3.13 Patient required renal replacement therapy on May 16 and May 18. Thereafter patient urine output has improved   Lab Results  Component Value Date   CREATININE 3.99 (H) 10/04/2020   CREATININE 4.55 (H) 10/03/2020   CREATININE 4.40 (H) 10/02/2020   05/20 0701 - 05/21 0700 In: 2182.7 [I.V.:367.6; NG/GT:1309.3; IV Piggyback:505.8] Out: 26 [Urine:2400; Drains:40]     # Sepsis, Gall bladder Abscess, S/p robotic assisted Lap Chole 09/08/2020 S/p CT guided GB abscess drain placement by VIR, on 5/12 Blood culture positive for Bacteroides and enterococcus on 5/12 - Off pressors - zosyn, fluconazole   # hyperphosphatemia -in setting of renal failure  -Now better  e Lab Results  Component Value Date   CALCIUM 7.7 (L) 10/04/2020   PHOS 4.3 10/04/2020  Phosphorus above goal  # Acute resp failure intubated 09/27/20-09/30/2020 Improving   #Anemia of critical illness Patient hemoglobin is low at 10 No need for PRBC at this time   # Hypokalemia Will replete    Plan  Patient has  adequate urine output -No need for renal placement therapy today We will replete potassium    LOS: 9 Trevaughn Schear s [facility] 5/21/20229:41 AM  [place], [place]

## 2020-10-04 NOTE — Progress Notes (Addendum)
NAME:  Richard Oconnell, MRN:  [id number], DOB:  1955/11/08, LOS: 9 ADMISSION DATE:  09/25/2020 REFERRING MD:  Charna Archer CHIEF COMPLAINT:  Feeling bad and belly pain  Brief Pt Description/Synopsis:  65 yo male with previous dx of acute cholecystitis s/p cholecystotomy tube 2/24 and then follow up robotic assisted cholecystectomy 4/25, now presents with large abdominal abscess with severe septic shock and BACTEREMIA, with multiorgan failure (liver and renal failure) complicated by severe metabolic acidosis and toxic metabolic encephalopathy.  Background:  65 year old man past medical history of hypertension, diabetes, and bladder cancer who was hospitalized with acute cholecystitis-several months ago 2/24 He was temporized with a percutaneous cholecystostomy tube.    Follow up  cholangiogram via the tube demonstrates that the cystic duct is still obstructed He was offered him a robot-assisted laparoscopic cholecystectomy, but  deferred this procedure  On 4/25 recently had cholecystectomy along with significant lysis of adhesions on 4/25 here at [facility].  He presents today with somnolence and confusion.   Family reports that patient is disoriented the day before yesterday, after which his skin has begun to appear increasingly yellow.    +discomfort in the right upper quadrant of his abdomen, liver failure, renal failure, severe acidosis Patient started on pressors, admitted for septic shock  WBC 25 LA 6.5-->8.7 CREAT 4.8 TB 9.5   Pertinent  Medical History  2/24 admitted for acute cholecystitis,percutaneous cholecystostomy tube placed by IR  3/29 offered  a robot-assisted laparoscopic cholecystectomy, but deferred  4/25 s/p cholecystectomy along with significant lysis of adhesions on 4/25 here at [facility].   Significant Hospital Events: Including procedures, antibiotic start and stop dates in addition to other pertinent events   2/24 admitted for acute cholecystitis,percutaneous  cholecystostomy tube placed by IR  3/29 offered  a robot-assisted laparoscopic cholecystectomy, but deferred  4/25 s/p cholecystectomy along with significant lysis of adhesions on 4/25 here at [facility]. 5/12 admission to ICU for large abd abscess, jaundice and septic shock, on pressors 5/12 CT abd-Large abscess in the gallbladder fossa, at least 10 cm in size; Percutaneous drain placed by IR 5/13: Nephrology following, starting Bicarb gtt, remains on Levophed; Blood cultures growing Enterococcus faecalis,Bacteroides fragilis.  Pt extremely delirious and agitated, trying to leave to go home, interfering with medical treatment, plan to start Precedex. High risk for intubation due to severe Encephalopathy 5/14severe sepsis, high risk for intubation, on precedex,  5/14 INTUBATED AND LEFT TRIALYSIS CATHETER PLACED 5/16: Plan for SBT following Hemodialysis.  Add Fluconazole for intraabdominal coverage 5/17: Repeat CT Abdomen/Pelvis @ 09:00.  Plan for SBT and hopeful for extubation as pt following commands this morning. Levophed currently weaned off. 5/18: Extubated yesterday, now on room air.  Remains encephalopathic, repeat CT head negative yesterday.  To get Hemodialysis today. 5/19: Abdominal drain exchanged by IR 5/20: Remains encephalopathic, poorly interactive 5/21: Remains encephalopathic, MRI brain ordered, NTS ordered, poor cough mechanics and clearance of secretions  Micro Data:  09/25/2020: SARS-CoV-2 PCR>> negative 09/25/2020: Influenza PCR>> negative 09/25/2020: Blood culture x2>>Enterococcus faecalis,Bacteroides fragilis 09/25/2020: MRSA PCR>> negative 09/25/2020: Aerobic/anaerobic culture from abdominal abscess>> ENTEROCOCCUS FAECALIS, BACTEROIDES FRAGILIS, BETA LACTAMASE POSITIVE (sensitive to Ampicillin, Gentamycin, Vancomycin) 09/26/2020: Urine>>no growth 09/30/2020: Blood culture>>no growth, 4 days (5/21)  Antimicrobials:  Zosyn 5/12>> 5/19 Fluconazole 5/16>> Unasyn  5/19>>  Antibiotics Given (last 72 hours)    Date/Time Action Medication Dose Rate   10/01/20 1300 New Bag/Given   piperacillin-tazobactam (ZOSYN) IVPB 2.25 g 2.25 g 100 mL/hr   10/01/20 1705 New Bag/Given  piperacillin-tazobactam (ZOSYN) IVPB 2.25 g 2.25 g 100 mL/hr   10/01/20 2343 New Bag/Given   piperacillin-tazobactam (ZOSYN) IVPB 2.25 g 2.25 g 100 mL/hr   10/02/20 0508 New Bag/Given   piperacillin-tazobactam (ZOSYN) IVPB 2.25 g 2.25 g 100 mL/hr   10/02/20 1140 New Bag/Given   piperacillin-tazobactam (ZOSYN) IVPB 3.375 g 3.375 g 12.5 mL/hr   10/02/20 2244 New Bag/Given   Ampicillin-Sulbactam (UNASYN) 3 g in sodium chloride 0.9 % 100 mL IVPB 3 g 200 mL/hr   10/03/20 1053 New Bag/Given   Ampicillin-Sulbactam (UNASYN) 3 g in sodium chloride 0.9 % 100 mL IVPB 3 g 200 mL/hr   10/03/20 2216 New Bag/Given   Ampicillin-Sulbactam (UNASYN) 3 g in sodium chloride 0.9 % 100 mL IVPB 3 g 200 mL/hr      Interval History:  Apparently interactive last night.  Less interactive this morning.  Retained secretions, poor cough mechanics.  Nods "yes" when asked if he knows where he is at however will not speak.  Objective   Blood pressure 134/65, pulse 64, temperature 99.6 F (37.6 C), temperature source Axillary, resp. rate (!) 21, height 6' 3.98" (1.93 m), weight 107.1 kg, SpO2 97 %.        Intake/Output Summary (Last 24 hours) at 10/04/2020 0853 Last data filed at 10/04/2020 0600 Gross per 24 hour  Intake 2182.73 ml  Output 2440 ml  Net -257.27 ml   Filed Weights   10/02/20 0408 10/03/20 0445 10/04/20 0442  Weight: 114.6 kg 111.2 kg 107.1 kg     REVIEW OF SYSTEMS Unable to assess due to altered mental status   PHYSICAL EXAMINATION: GENERAL:acutely ill appearing made, laying in bed, on room air, in NAD, poor cough mechanics HEAD: Normocephalic EYES: Pupils equal, round, reactive to light.  No scleral icterus.  MOUTH: MM dry NECK: Supple. No JVD present  PULMONARY: faint rhonchi  throughout, even, non labored  CARDIOVASCULAR: irregular irregular, no R/G, 2+ radial/2+ distal pulses GASTROINTESTINAL: distended, +BS x4, non tender Percutaneous drain to RUQ is clean dry and intact   MUSCULOSKELETAL: No deformities. Generalized weakness. No swelling, clubbing, 1-2+ edema. NEUROLOGIC: Awake. Oriented to self only. Follows simple commands inconsistently. PERRLA SKIN: Warm and dry.  No obvious rashes, lesions, or ulcerations  Labs/imaging that I havepersonally reviewed  (right click and "Reselect all SmartList Selections" daily)  Labs 10/01/2020: K 3.2, glucose 209, BUN 92, Cr. 5.65, albumin 2, WBC 18.9, Hgb 11.3, HCT 33.4 CT Abdomen & Pelvis 5/17>>1. Collection of gas and fluid in the gallbladder fossa with small locules of gas also tracking along the pigtail catheter drain along the peritoneal margin of the right anterior abdominal wall. Given that we are over 3 weeks out from cholecystectomy, this collection is abnormal and is compatible with abscess. As on prior exams, the adjacent hepatic flexure is somewhat indistinct with some spanning stranding, making it difficult to completely exclude a fistula between the gallbladder fossa region and the colon although no obvious linear collection of extraluminal gas is seen. 2. Third spacing of fluid with ascites, widespread mesenteric and subcutaneous edema. Retroperitoneal edema on the left. 3. Bibasilar atelectasis with small bilateral pleural effusions. 4.  Aortic Atherosclerosis (ICD10-I70.0).  Coronary atherosclerosis. 5. Multilevel thoracic and lumbar impingement. 6. Transverse, descending, and sigmoid colon diverticulosis. 7. Nonspecific sclerotic lesion in the left eighth rib laterally, no change from 2018, highly likely to be benign. 8. Potentially complex exophytic lesion from the left kidney upper pole, possibly a complex cyst although a small mass is not  excluded. CT Head w/o contrast 5/17>>No CT evidence of acute  intracranial abnormality. Stable non-contrast CT appearance of the brain as compared to 09/25/2020.Mild cerebral atrophy and chronic small vessel ischemic disease. Labs 10/03/2020: alk phos 168, wbc 14.7, hgb 11.5  Labs 10/04/2020: WBC 14.6, Hgb10.0   ASSESSMENT/PLAN   Acute Hypoxic Respiratory Failure in the setting of severe metabolic derangements due to septic shock, AKI, and Metabolic Encephalopathy -Extubated 09/30/20 -Supplemental O2 as needed to maintain O2 sats >92% -Follow intermittent CXR  -PRN Bronchodilators -Pulmonary toilet, NTS as needed  Septic Shock~resolved Atrial fibrillation with rvr  Echocardiogram 09/27/20 with LVEF 60-65%, grade II diastolic dysfunction, RV systolic function normal; normal valves & no vegetations -Continuous cardiac monitoring -Maintain MAP greater than 65 -Developed issues with junctional rhythm/bradycardia - Amiodarone discontinued overnight - Not a candidate for full anticoagulation at present due to intra-abdominal issues  Severe sepsis due to intra-abdominal abscess and BACTEREMIA (Enterococcus faecalis, Bacteroides fragilis) -Monitor fever curve -Trend WBCs and procalcitonin -Follow cultures as above -ID consulted appreciate input~per recommendations abx adjusted to ampicillin/sulbactam per sensitivities and continue fluconazole 05/19 -Status post Percutaneous drain placement by IR into gallbladder fossa abscess on 09/25/2020 for source control -Percutaneous drain exchange by IR 5/19  -General surgery following, appreciate input  Acute Kidney Injury Anion Gap Metabolic Acidosis -Trend BMP  -Avoid nephrotoxic medications as able -Replace electrolytes as indicated -Nephrology following, appreciate input -HD as per Nephrology  Acute toxic metabolic encephalopathy, suspect in setting of Sepsis, AKI, and sleep deprivation~Urine drug screen + for benzodiazepines 09/26/2020 -Provide supportive care -Melatonin qhs prn and promote normal  sleep/wake cycle -Avoid sedating meds as able -CT Head 09/30/20 negative -Urine drug screen + for Benzodiazepines -MRI brain ordered today  Anemia without s/sx obvious bleeding  -Monitor for S/Sx of bleeding -Trend CBC -SCD's for VTE Prophylaxis  -Transfuse for Hgb <7 -May be dilutional effect due to poor renal function hypervolemia  Hyperglycemia -CBG's -SSI -Follow ICU Hypo/Hyperglycemia protocol.  Best practice (right click and "Reselect all SmartList Selections" daily)  Diet: NPO, tube feeds Pain/Anxiety/Delirium protocol (if indicated): N/A VAP protocol (if indicated): N/A DVT prophylaxis: Heparin SQ GI prophylaxis: PPI Glucose control:  SSI, Lantus Central venous access:  Yes, and still needed Arterial line:  N/A Foley:  Yes, and still needed Mobility:  OOB as tolerated, PT/OT ordered PT consulted: N/A Code Status:  full code Disposition: SDU   Labs   CBC: Recent Labs  Lab 09/28/20 0418 09/29/20 0456 09/30/20 0514 10/01/20 0346 10/02/20 0305 10/03/20 0356 10/04/20 0627  WBC 36.9* 27.0* 19.3* 18.9* 21.6* 14.7* 14.6*  NEUTROABS 32.5* 23.2*  --   --   --   --   --   HGB 11.5* 10.5* 9.6* 11.3* 12.3* 11.5* 10.0*  HCT 33.4* 30.1* 28.7* 33.4* 36.6* 34.1* 30.1*  MCV 79.1* 78.0* 79.5* 79.7* 79.9* 80.4 82.7  PLT 562* 475* 373 381 379 316 263    Basic Metabolic Panel: Recent Labs  Lab 09/30/20 0514 10/01/20 0340 10/01/20 0346 10/01/20 2236 10/02/20 0305 10/03/20 0356 10/04/20 0627  NA 135 136  --   --  139 143 144  K 3.5 3.2*  --  3.1* 3.3* 3.4* 3.0*  CL 94* 95*  --   --  100 105 106  CO2 29 27  --   --  26 25 27   GLUCOSE 148* 209*  --   --  197* 263* 190*  BUN 81* 92*  --   --  64* 67* 73*  CREATININE 5.49*  5.65*  --   --  4.40* 4.55* 3.99*  CALCIUM 7.0* 7.1*  --   --  7.3* 7.5* 7.7*  MG 2.4  --  2.4  --  2.2 2.2 2.3  PHOS 6.8* 6.6*  --   --  3.8 4.7* 4.3   GFR: Estimated Creatinine Clearance: 24.8 mL/min (A) (by C-G formula based on SCr of 3.99  mg/dL (H)). Recent Labs  Lab 09/28/20 0418 09/29/20 0456 10/01/20 0346 10/02/20 0305 10/03/20 0356 10/04/20 0627  PROCALCITON 56.30  --   --   --   --   --   WBC 36.9*   < > 18.9* 21.6* 14.7* 14.6*   < > = values in this interval not displayed.    Liver Function Tests: Recent Labs  Lab 09/28/20 0418 09/29/20 0456 09/30/20 0514 10/01/20 0340 10/02/20 0305 10/03/20 0356 10/04/20 0627  AST 91* 55*  --   --   --  39  --   ALT 53* 42  --   --   --  24  --   ALKPHOS 255* 213*  --   --   --  168*  --   BILITOT 2.9* 2.2*  --   --   --  1.2  --   PROT 6.3* 5.8*  --   --   --  5.6*  --   ALBUMIN 1.9* 1.8* 1.9* 2.0* 2.0* 1.9*  1.9* 1.9*   No results for input(s): LIPASE, AMYLASE in the last 168 hours. Recent Labs  Lab 09/30/20 1751  AMMONIA 20    ABG    Component Value Date/Time   PHART 7.35 09/27/2020 1155   PCO2ART 42 09/27/2020 1155   PO2ART 71 (L) 09/27/2020 1155   HCO3 23.2 09/27/2020 1155   ACIDBASEDEF 2.3 (H) 09/27/2020 1155   O2SAT 93.1 09/27/2020 1155     Coagulation Profile: Recent Labs  Lab 10/04/20 0627  INR 1.2    Cardiac Enzymes: No results for input(s): CKTOTAL, CKMB, CKMBINDEX, TROPONINI in the last 168 hours.  HbA1C: Hemoglobin A1C  Date/Time Value Ref Range Status  12/27/2019 12:00 AM 6.9  Final    Comment:    [facility]   Hgb A1c MFr Bld  Date/Time Value Ref Range Status  09/26/2020 04:20 AM 6.7 (H) 4.8 - 5.6 % Final    Comment:    (NOTE) Pre diabetes:          5.7%-6.4%  Diabetes:              >6.4%  Glycemic control for   <7.0% adults with diabetes   05/30/2020 10:47 AM 7.2 (H) <5.7 % of total Hgb Final    Comment:    For someone without known diabetes, a hemoglobin A1c value of 6.5% or greater indicates that they may have  diabetes and this should be confirmed with a follow-up  test. . For someone with known diabetes, a value <7% indicates  that their diabetes is well controlled and a value  greater than or equal  to 7% indicates suboptimal  control. A1c targets should be individualized based on  duration of diabetes, age, comorbid conditions, and  other considerations. . Currently, no consensus exists regarding use of hemoglobin A1c for diagnosis of diabetes for children. .     CBG: Recent Labs  Lab 10/03/20 1612 10/03/20 1915 10/03/20 2313 10/04/20 0427 10/04/20 0712  GLUCAP 199* 214* 202* 147* 178*     Past Medical History:  He,  has a past medical  history of Cancer [facility]), Erectile dysfunction associated with type 2 diabetes mellitus ([place]) (05/26/2016), Gross hematuria (11/16/2016), Hypertension (06/11/2015), and Type 2 diabetes mellitus with diabetic polyneuropathy, without long-term current use of insulin ([place]) (06/11/2015).   Surgical History:   Past Surgical History:  Procedure Laterality Date  . boil lanceted     hand  . COLONOSCOPY WITH PROPOFOL N/A 07/03/2020   Procedure: COLONOSCOPY WITH PROPOFOL;  Surgeon: Jonathon Bellows, MD;  Location: [facility] ENDOSCOPY;  Service: Gastroenterology;  Laterality: N/A;  . CYSTOSCOPY W/ RETROGRADES Bilateral 12/06/2016   Procedure: CYSTOSCOPY WITH RETROGRADE PYELOGRAM;  Surgeon: Hollice Espy, MD;  Location: [facility] ORS;  Service: Urology;  Laterality: Bilateral;  . CYSTOSCOPY W/ RETROGRADES Bilateral 06/27/2017   Procedure: CYSTOSCOPY WITH RETROGRADE PYELOGRAM;  Surgeon: Hollice Espy, MD;  Location: [facility] ORS;  Service: Urology;  Laterality: Bilateral;  . CYSTOSCOPY WITH BIOPSY N/A 06/27/2017   Procedure: CYSTOSCOPY WITH Bladder BIOPSY;  Surgeon: Hollice Espy, MD;  Location: [facility] ORS;  Service: Urology;  Laterality: N/A;  . CYSTOSCOPY WITH BIOPSY N/A 10/24/2017   Procedure: CYSTOSCOPY WITH Bladder BIOPSY;  Surgeon: Hollice Espy, MD;  Location: [facility] ORS;  Service: Urology;  Laterality: N/A;  . ingrown  Bilateral    ingrown toenail  . IR BILIARY DRAIN PLACEMENT WITH CHOLANGIOGRAM  07/10/2020  . IR CHOLANGIOGRAM EXISTING TUBE  08/07/2020  . IR  RADIOLOGIST EVAL & MGMT  08/07/2020  . TRANSURETHRAL RESECTION OF BLADDER TUMOR N/A 12/06/2016   Procedure: TRANSURETHRAL RESECTION OF BLADDER TUMOR (TURBT) (2-5cm) CLOT EVACUATION;  Surgeon: Hollice Espy, MD;  Location: [facility] ORS;  Service: Urology;  Laterality: N/A;  . TRANSURETHRAL RESECTION OF BLADDER TUMOR N/A 06/27/2017   Procedure: TRANSURETHRAL RESECTION OF BLADDER TUMOR (TURBT);  Surgeon: Hollice Espy, MD;  Location: [facility] ORS;  Service: Urology;  Laterality: N/A;  . TRANSURETHRAL RESECTION OF BLADDER TUMOR WITH MITOMYCIN-C N/A 01/18/2017   Procedure: TRANSURETHRAL RESECTION OF BLADDER TUMOR WITH MITOMYCIN-C-(SMALL);  Surgeon: Hollice Espy, MD;  Location: [facility] ORS;  Service: Urology;  Laterality: N/A;     Social History:   reports that he has never smoked. He has never used smokeless tobacco. He reports that he does not drink alcohol and does not use drugs.   Family History:  His family history includes Heart attack in his maternal aunt, maternal grandfather, maternal grandmother, and mother; Heart disease in his maternal grandfather and mother; Parkinson's disease in his father. There is no history of Prostate cancer, Bladder Cancer, or Kidney cancer.   Allergies Allergies  Allergen Reactions  . Cyclobenzaprine Other (See Comments)    Not improving pain and makes him agressive.  . Lisinopril Cough     Scheduled Meds: . chlorhexidine gluconate (MEDLINE KIT)  15 mL Mouth Rinse BID  . Chlorhexidine Gluconate Cloth  6 each Topical Daily  . docusate  100 mg Per Tube BID  . feeding supplement (PROSource TF)  45 mL Per Tube Daily  . free water  30 mL Per Tube Q4H  . heparin injection (subcutaneous)  5,000 Units Subcutaneous Q8H  . insulin aspart  0-20 Units Subcutaneous Q4H  . insulin glargine  15 Units Subcutaneous QHS  . mouth rinse  15 mL Mouth Rinse 10 times per day  . multivitamin  1 tablet Per Tube QHS  . pantoprazole (PROTONIX) IV  40 mg Intravenous Q24H  . polyethylene  glycol  17 g Per Tube Daily  . sodium chloride flush  3 mL Intravenous Q12H  . sodium chloride flush  5 mL Intracatheter Q8H  .  sodium chloride flush  5 mL Intracatheter Q8H   Continuous Infusions: . sodium chloride Stopped (09/25/20 1647)  . sodium chloride    . sodium chloride 5 mL/hr at 10/04/20 0300  . ampicillin-sulbactam (UNASYN) IV Stopped (10/03/20 2246)  . feeding supplement (VITAL 1.5 CAL) 1,000 mL (10/04/20 0014)  . fluconazole (DIFLUCAN) IV Stopped (10/04/20 0029)   PRN Meds:.sodium chloride, acetaminophen, albuterol, docusate, fentaNYL (SUBLIMAZE) injection, haloperidol lactate, melatonin, ondansetron (ZOFRAN) IV, polyethylene glycol, sodium chloride flush    Patient's brother and sister updated at bedside.      Renold Don, MD [facility]   *This note was dictated using voice recognition software/Dragon.  Despite best efforts to proofread, errors can occur which can change the meaning.  Any change was purely unintentional.

## 2020-10-05 DIAGNOSIS — A419 Sepsis, unspecified organism: Secondary | ICD-10-CM | POA: Diagnosis not present

## 2020-10-05 DIAGNOSIS — R6521 Severe sepsis with septic shock: Secondary | ICD-10-CM | POA: Diagnosis not present

## 2020-10-05 DIAGNOSIS — L0291 Cutaneous abscess, unspecified: Secondary | ICD-10-CM | POA: Diagnosis not present

## 2020-10-05 DIAGNOSIS — R4182 Altered mental status, unspecified: Secondary | ICD-10-CM | POA: Diagnosis not present

## 2020-10-05 LAB — RENAL FUNCTION PANEL
Albumin: 2 g/dL — ABNORMAL LOW (ref 3.5–5.0)
Anion gap: 11 (ref 5–15)
BUN: 76 mg/dL — ABNORMAL HIGH (ref 8–23)
CO2: 26 mmol/L (ref 22–32)
Calcium: 7.9 mg/dL — ABNORMAL LOW (ref 8.9–10.3)
Chloride: 111 mmol/L (ref 98–111)
Creatinine, Ser: 3.4 mg/dL — ABNORMAL HIGH (ref 0.61–1.24)
GFR, Estimated: 19 mL/min — ABNORMAL LOW (ref >60)
Glucose, Bld: 209 mg/dL — ABNORMAL HIGH (ref 70–99)
Phosphorus: 4.4 mg/dL (ref 2.5–4.6)
Potassium: 3.2 mmol/L — ABNORMAL LOW (ref 3.5–5.1)
Sodium: 148 mmol/L — ABNORMAL HIGH (ref 135–145)

## 2020-10-05 LAB — CBC WITH DIFFERENTIAL/PLATELET
Abs Immature Granulocytes: 0.17 10*3/uL — ABNORMAL HIGH (ref 0.00–0.07)
Basophils Absolute: 0.1 10*3/uL (ref 0.0–0.1)
Basophils Relative: 1 %
Eosinophils Absolute: 0.4 10*3/uL (ref 0.0–0.5)
Eosinophils Relative: 3 %
HCT: 32 % — ABNORMAL LOW (ref 39.0–52.0)
Hemoglobin: 10.5 g/dL — ABNORMAL LOW (ref 13.0–17.0)
Immature Granulocytes: 1 %
Lymphocytes Relative: 14 %
Lymphs Abs: 1.9 10*3/uL (ref 0.7–4.0)
MCH: 27.2 pg (ref 26.0–34.0)
MCHC: 32.8 g/dL (ref 30.0–36.0)
MCV: 82.9 fL (ref 80.0–100.0)
Monocytes Absolute: 1 10*3/uL (ref 0.1–1.0)
Monocytes Relative: 8 %
Neutro Abs: 9.8 10*3/uL — ABNORMAL HIGH (ref 1.7–7.7)
Neutrophils Relative %: 73 %
Platelets: 316 10*3/uL (ref 150–400)
RBC: 3.86 MIL/uL — ABNORMAL LOW (ref 4.22–5.81)
RDW: 18.2 % — ABNORMAL HIGH (ref 11.5–15.5)
WBC: 13.3 10*3/uL — ABNORMAL HIGH (ref 4.0–10.5)
nRBC: 0 % (ref 0.0–0.2)

## 2020-10-05 LAB — MAGNESIUM: Magnesium: 2 mg/dL (ref 1.7–2.4)

## 2020-10-05 LAB — GLUCOSE, CAPILLARY
Glucose-Capillary: 202 mg/dL — ABNORMAL HIGH (ref 70–99)
Glucose-Capillary: 217 mg/dL — ABNORMAL HIGH (ref 70–99)
Glucose-Capillary: 187 mg/dL — ABNORMAL HIGH (ref 70–99)
Glucose-Capillary: 201 mg/dL — ABNORMAL HIGH (ref 70–99)
Glucose-Capillary: 200 mg/dL — ABNORMAL HIGH (ref 70–99)

## 2020-10-05 LAB — CULTURE, BLOOD (SINGLE)
Culture: NO GROWTH
Special Requests: ADEQUATE

## 2020-10-05 MED ORDER — INSULIN ASPART 100 UNIT/ML IJ SOLN
3.0000 [IU] | INTRAMUSCULAR | Status: DC
  Administered 2020-10-05 – 2020-10-07 (×12): 3 [IU] via SUBCUTANEOUS
  Filled 2020-10-05 (×11): qty 1

## 2020-10-05 MED ORDER — FREE WATER
200.0000 mL | Status: DC
  Administered 2020-10-05 – 2020-10-07 (×11): 200 mL

## 2020-10-05 MED ORDER — POTASSIUM CHLORIDE 10 MEQ/100ML IV SOLN
10.0000 meq | INTRAVENOUS | Status: AC
  Administered 2020-10-05 (×3): 10 meq via INTRAVENOUS
  Filled 2020-10-05 (×3): qty 100

## 2020-10-05 NOTE — Plan of Care (Signed)
Patient stable this shift. Remains afebrile. His HR continues to be irregular and bounces between NSR, SB, and a junctional rate in the 50s. Pt briefly required 2LO2 [place] while sleeping this AM, but was able to wean back to RA when awake. Patient sleepy and drowsy most of the shift, but easy to arouse and oriented x2, and becoming more interactive. Foley cath in place with good UOP. Flexiseal remains in place, pt continues to have liquid stools.    Problem: Clinical Measurements: Goal: Ability to maintain clinical measurements within normal limits will improve Outcome: Progressing Goal: Will remain free from infection Outcome: Progressing Goal: Diagnostic test results will improve Outcome: Progressing Goal: Respiratory complications will improve Outcome: Progressing Goal: Cardiovascular complication will be avoided Outcome: Progressing   Problem: Nutrition: Goal: Adequate nutrition will be maintained Outcome: Progressing   Problem: Coping: Goal: Level of anxiety will decrease Outcome: Progressing   Problem: Elimination: Goal: Will not experience complications related to bowel motility Outcome: Progressing Goal: Will not experience complications related to urinary retention Outcome: Progressing   Problem: Pain Managment: Goal: General experience of comfort will improve Outcome: Progressing   Problem: Safety: Goal: Ability to remain free from injury will improve Outcome: Progressing   Problem: Skin Integrity: Goal: Risk for impaired skin integrity will decrease Outcome: Progressing   Problem: Fluid Volume: Goal: Hemodynamic stability will improve Outcome: Progressing   Problem: Clinical Measurements: Goal: Diagnostic test results will improve Outcome: Progressing Goal: Signs and symptoms of infection will decrease Outcome: Progressing   Problem: Respiratory: Goal: Ability to maintain adequate ventilation will improve Outcome: Progressing   Problem: Fluid  Volume: Goal: Hemodynamic stability will improve Outcome: Progressing   Problem: Clinical Measurements: Goal: Diagnostic test results will improve Outcome: Progressing Goal: Signs and symptoms of infection will decrease Outcome: Progressing   Problem: Respiratory: Goal: Ability to maintain adequate ventilation will improve Outcome: Progressing

## 2020-10-05 NOTE — Progress Notes (Signed)
[place], [place] 10/05/20  Subjective:   Hospital day # 10  Patient is critically ill Resting comfortably Patient remains confused but more alert than yesterday Patient family member present in the room.    05/21 0701 - 05/22 0700 In: 3227.3 [I.V.:40.9; WU/JW:1191.4; IV Piggyback:348.9] Out: 1775 [Urine:1725; Drains:20; Stool:30] Lab Results  Component Value Date   CREATININE 3.40 (H) 10/05/2020   CREATININE 3.99 (H) 10/04/2020   CREATININE 4.55 (H) 10/03/2020      Objective:  Vital signs in last 24 hours:  Temp:  [98.2 F (36.8 C)-99.9 F (37.7 C)] 98.2 F (36.8 C) (05/22 0753) Pulse Rate:  [42-74] 62 (05/22 1300) Resp:  [18-31] 31 (05/22 1300) BP: (122-183)/(62-133) 153/97 (05/22 1300) SpO2:  [86 %-95 %] 93 % (05/22 1300) Weight:  [109.9 kg] 109.9 kg (05/22 0423)  Weight change: 2.8 kg Filed Weights   10/03/20 0445 10/04/20 0442 10/05/20 0423  Weight: 111.2 kg 107.1 kg 109.9 kg    Intake/Output:    Intake/Output Summary (Last 24 hours) at 10/05/2020 1418 Last data filed at 10/05/2020 1300 Gross per 24 hour  Intake 3588.73 ml  Output 2630 ml  Net 958.73 ml    Physical Exam: General:  No acute distress, laying in the bed  HEENT  NGT in place, mouth and tongue dry  Pulm/lungs  Rhonchi breath sounds , no distress   CVS/Heart  regular rhythm, no rub or gallop  Abdomen:   Soft, nontender, gall bladder drain in place  Extremities:  No peripheral edema  Neurologic:  follows simple commands  Skin:  No acute rashes   foley in place Left IJ temporary cath 09/27/20- ICU team  Basic Metabolic Panel:  Recent Labs  Lab 10/01/20 0340 10/01/20 0346 10/01/20 2236 10/02/20 0305 10/03/20 0356 10/04/20 0627 10/05/20 0410  NA 136  --   --  139 143 144 148*  K 3.2*  --  3.1* 3.3* 3.4* 3.0* 3.2*  CL 95*  --   --  100 105 106 111  CO2 27  --   --  26 25 27 26   GLUCOSE 209*  --   --  197* 263* 190* 209*  BUN 92*  --   --  64* 67*  73* 76*  CREATININE 5.65*  --   --  4.40* 4.55* 3.99* 3.40*  CALCIUM 7.1*  --   --  7.3* 7.5* 7.7* 7.9*  MG  --  2.4  --  2.2 2.2 2.3 2.0  PHOS 6.6*  --   --  3.8 4.7* 4.3 4.4     CBC: Recent Labs  Lab 09/29/20 0456 09/30/20 0514 10/01/20 0346 10/02/20 0305 10/03/20 0356 10/04/20 0627 10/05/20 0410  WBC 27.0*   < > 18.9* 21.6* 14.7* 14.6* 13.3*  NEUTROABS 23.2*  --   --   --   --   --  9.8*  HGB 10.5*   < > 11.3* 12.3* 11.5* 10.0* 10.5*  HCT 30.1*   < > 33.4* 36.6* 34.1* 30.1* 32.0*  MCV 78.0*   < > 79.7* 79.9* 80.4 82.7 82.9  PLT 475*   < > 381 379 316 273 316   < > = values in this interval not displayed.      Lab Results  Component Value Date   HEPBSAG NON REACTIVE 09/26/2020   HEPBSAB NON REACTIVE 09/26/2020   HEPBIGM NON REACTIVE 09/26/2020      Microbiology:  Recent Results (from the past 240 hour(s))  Culture, blood (routine  x 2)     Status: Abnormal   Collection Time: 09/25/20  2:41 PM   Specimen: BLOOD  Result Value Ref Range Status   Specimen Description   Final    BLOOD RIGHT ANTECUBITAL Performed at [facility], [address] [place], [place] [address]    Special Requests   Final    BOTTLES DRAWN AEROBIC AND ANAEROBIC Blood Culture results may not be optimal due to an excessive volume of blood received in culture bottles Performed at [facility], [address] [place], [place] [address]    Culture  Setup Time   Final    Organism ID to follow [place] AND ANAEROBIC BOTTLES CRITICAL RESULT CALLED TO, READ BACK BY AND VERIFIED WITH: LISA KLUTTZ @0802  ON 09/26/20.[place] Performed at [facility], [address] [place], [place] [address]    Culture ENTEROCOCCUS FAECALIS (A)  Final   Report Status 09/28/2020 FINAL  Final   Organism ID, Bacteria ENTEROCOCCUS FAECALIS  Final      Susceptibility   Enterococcus faecalis - MIC*    AMPICILLIN <=2 SENSITIVE Sensitive     VANCOMYCIN 1  SENSITIVE Sensitive     GENTAMICIN SYNERGY SENSITIVE Sensitive     * ENTEROCOCCUS FAECALIS  Blood Culture ID Panel (Reflexed)     Status: Abnormal   Collection Time: 09/25/20  2:41 PM  Result Value Ref Range Status   Enterococcus faecalis DETECTED (A) NOT DETECTED Final    Comment: CRITICAL RESULT CALLED TO, READ BACK BY AND VERIFIED WITH: LISA KLUTTZ @0802  ON 09/26/20.SH    Enterococcus Faecium NOT DETECTED NOT DETECTED Final   Listeria monocytogenes NOT DETECTED NOT DETECTED Final   Staphylococcus species NOT DETECTED NOT DETECTED Final   Staphylococcus aureus (BCID) NOT DETECTED NOT DETECTED Final   Staphylococcus epidermidis NOT DETECTED NOT DETECTED Final   Staphylococcus lugdunensis NOT DETECTED NOT DETECTED Final   Streptococcus species NOT DETECTED NOT DETECTED Final   Streptococcus agalactiae NOT DETECTED NOT DETECTED Final   Streptococcus pneumoniae NOT DETECTED NOT DETECTED Final   Streptococcus pyogenes NOT DETECTED NOT DETECTED Final   A.calcoaceticus-baumannii NOT DETECTED NOT DETECTED Final   Bacteroides fragilis DETECTED (A) NOT DETECTED Final    Comment: CRITICAL RESULT CALLED TO, READ BACK BY AND VERIFIED WITH: LISA KLUTTZ @0802  ON 09/26/20.SH    Enterobacterales NOT DETECTED NOT DETECTED Final   Enterobacter cloacae complex NOT DETECTED NOT DETECTED Final   Escherichia coli NOT DETECTED NOT DETECTED Final   Klebsiella aerogenes NOT DETECTED NOT DETECTED Final   Klebsiella oxytoca NOT DETECTED NOT DETECTED Final   Klebsiella pneumoniae NOT DETECTED NOT DETECTED Final   Proteus species NOT DETECTED NOT DETECTED Final   Salmonella species NOT DETECTED NOT DETECTED Final   Serratia marcescens NOT DETECTED NOT DETECTED Final   Haemophilus influenzae NOT DETECTED NOT DETECTED Final   Neisseria meningitidis NOT DETECTED NOT DETECTED Final   Pseudomonas aeruginosa NOT DETECTED NOT DETECTED Final   Stenotrophomonas maltophilia NOT DETECTED NOT DETECTED Final   Candida  albicans NOT DETECTED NOT DETECTED Final   Candida auris NOT DETECTED NOT DETECTED Final   Candida glabrata NOT DETECTED NOT DETECTED Final   Candida krusei NOT DETECTED NOT DETECTED Final   Candida parapsilosis NOT DETECTED NOT DETECTED Final   Candida tropicalis NOT DETECTED NOT DETECTED Final   Cryptococcus neoformans/gattii NOT DETECTED NOT DETECTED Final   Vancomycin resistance NOT DETECTED NOT DETECTED Final    Comment: Performed at [facility], [address] [place], [place] [address]  Culture, blood (routine x 2)     Status: None   Collection Time: 09/25/20  3:42 PM   Specimen: BLOOD  Result Value Ref Range Status   Specimen Description BLOOD BLOOD LEFT HAND  Final   Special Requests   Final    BOTTLES DRAWN AEROBIC AND ANAEROBIC Blood Culture adequate volume   Culture   Final    NO GROWTH 5 DAYS Performed at [facility], [address] [place], [place] [address]    Report Status 09/30/2020 FINAL  Final  Resp Panel by RT-PCR (Flu A&B, Covid) Nasopharyngeal Swab     Status: None   Collection Time: 09/25/20  3:51 PM   Specimen: Nasopharyngeal Swab; Nasopharyngeal(NP) swabs in vial transport medium  Result Value Ref Range Status   SARS Coronavirus 2 by RT PCR NEGATIVE NEGATIVE Final    Comment: (NOTE) SARS-CoV-2 target nucleic acids are NOT DETECTED.  The SARS-CoV-2 RNA is generally detectable in upper respiratory specimens during the acute phase of infection. The lowest concentration of SARS-CoV-2 viral copies this assay can detect is 138 copies/mL. A negative result does not preclude SARS-Cov-2 infection and should not be used as the sole basis for treatment or other patient management decisions. A negative result may occur with  improper specimen collection/handling, submission of specimen other than nasopharyngeal swab, presence of viral mutation(s) within the areas targeted by this assay, and inadequate number of viral copies(<138 copies/mL). A  negative result must be combined with clinical observations, patient history, and epidemiological information. The expected result is Negative.  Fact Sheet for Patients:  EntrepreneurPulse.com.au  Fact Sheet for Healthcare Providers:  IncredibleEmployment.be  This test is no t yet approved or cleared by the [place] FDA and  has been authorized for detection and/or diagnosis of SARS-CoV-2 by FDA under an Emergency Use Authorization (EUA). This EUA will remain  in effect (meaning this test can be used) for the duration of the COVID-19 declaration under Section 564(b)(1) of the Act, 21 U.S.C.section 360bbb-3(b)(1), unless the authorization is terminated  or revoked sooner.       Influenza A by PCR NEGATIVE NEGATIVE Final   Influenza B by PCR NEGATIVE NEGATIVE Final    Comment: (NOTE) The Xpert Xpress SARS-CoV-2/FLU/RSV plus assay is intended as an aid in the diagnosis of influenza from Nasopharyngeal swab specimens and should not be used as a sole basis for treatment. Nasal washings and aspirates are unacceptable for Xpert Xpress SARS-CoV-2/FLU/RSV testing.  Fact Sheet for Patients: EntrepreneurPulse.com.au  Fact Sheet for Healthcare Providers: IncredibleEmployment.be  This test is not yet approved or cleared by the [place] FDA and has been authorized for detection and/or diagnosis of SARS-CoV-2 by FDA under an Emergency Use Authorization (EUA). This EUA will remain in effect (meaning this test can be used) for the duration of the COVID-19 declaration under Section 564(b)(1) of the Act, 21 U.S.C. section 360bbb-3(b)(1), unless the authorization is terminated or revoked.  Performed at [facility], [address] [place], [place] [address]   MRSA PCR Screening     Status: None   Collection Time: 09/25/20  6:44 PM   Specimen: Nasopharyngeal  Result Value Ref Range Status   MRSA by  PCR NEGATIVE NEGATIVE Final    Comment:        The GeneXpert MRSA Assay (FDA approved for NASAL specimens only), is one component of a comprehensive MRSA colonization surveillance program. It is not intended to diagnose  MRSA infection nor to guide or monitor treatment for MRSA infections. Performed at [facility], [address] [place], [place] [address]   Aerobic/Anaerobic Culture (surgical/deep wound)     Status: None   Collection Time: 09/25/20  9:02 PM   Specimen: Abscess  Result Value Ref Range Status   Specimen Description   Final    ABSCESS Performed at [facility], [address] [place], [place] [address]    Special Requests   Final    Normal Performed at [place], [place] [address]    Gram Stain   Final    ABUNDANT WBC PRESENT,BOTH PMN AND MONONUCLEAR MODERATE GRAM NEGATIVE COCCOBACILLI    Culture   Final    ABUNDANT ENTEROCOCCUS FAECALIS MODERATE BACTEROIDES FRAGILIS BETA LACTAMASE POSITIVE Performed at [facility], [place] [address], [place], [place] [address]    Report Status 09/28/2020 FINAL  Final   Organism ID, Bacteria ENTEROCOCCUS FAECALIS  Final      Susceptibility   Enterococcus faecalis - MIC*    AMPICILLIN <=2 SENSITIVE Sensitive     VANCOMYCIN 1 SENSITIVE Sensitive     GENTAMICIN SYNERGY SENSITIVE Sensitive     * ABUNDANT ENTEROCOCCUS FAECALIS  Urine culture     Status: None   Collection Time: 09/26/20  1:36 PM   Specimen: Urine, Random  Result Value Ref Range Status   Specimen Description   Final    URINE, RANDOM Performed at [facility], [address] [place], [place] [address]    Special Requests   Final    NONE Performed at [facility], [address], [place], [place] [address]    Culture   Final    NO GROWTH Performed at [facility], [place] [address] [place], [place] [address]    Report Status 09/28/2020 FINAL  Final  Culture, blood  (single) w Reflex to ID Panel     Status: None   Collection Time: 09/30/20  3:35 PM   Specimen: BLOOD  Result Value Ref Range Status   Specimen Description BLOOD BLOOD RIGHT HAND  Final   Special Requests   Final    BOTTLES DRAWN AEROBIC AND ANAEROBIC Blood Culture adequate volume   Culture   Final    NO GROWTH 5 DAYS Performed at [facility], [address], [place],  [address]    Report Status 10/05/2020 FINAL  Final    Coagulation Studies: Recent Labs    10/04/20 0627  LABPROT 14.7  INR 1.2    Urinalysis: No results for input(s): COLORURINE, LABSPEC, PHURINE, GLUCOSEU, HGBUR, BILIRUBINUR, KETONESUR, PROTEINUR, UROBILINOGEN, NITRITE, LEUKOCYTESUR in the last 72 hours.  Invalid input(s): APPERANCEUR    Imaging: MR BRAIN WO CONTRAST  Result Date: 10/04/2020 CLINICAL DATA:  Mental status change. Septic shock. Abdominal abscess. EXAM: MRI HEAD WITHOUT CONTRAST TECHNIQUE: Multiplanar, multiecho pulse sequences of the brain and surrounding structures were obtained without intravenous contrast. COMPARISON:  CT head 09/30/2020 FINDINGS: Brain: Mild cerebral atrophy. Negative for hydrocephalus. Mild white matter changes with scattered small white matter hyperintensities bilaterally. Negative for acute infarct.  Negative for hemorrhage mass or edema. Vascular: Normal arterial flow voids Skull and upper cervical spine: Negative Sinuses/Orbits: Paranasal sinuses clear.  Normal orbit Other: None IMPRESSION: No acute abnormality. Mild atrophy and mild white matter changes consistent with chronic microvascular ischemia. Electronically Signed   By: Franchot Gallo M.D.   On: 10/04/2020 12:33     Medications:   .  sodium chloride Stopped (09/25/20 1647)  . sodium chloride    . sodium chloride Stopped (10/04/20 1112)  . ampicillin-sulbactam (UNASYN) IV 3 g (10/05/20 1039)  . feeding supplement (VITAL 1.5 CAL) 1,000 mL (10/04/20 1719)  . fluconazole (DIFLUCAN) IV 100 mL/hr at  10/04/20 2308  . potassium chloride 100 mL/hr at 10/05/20 1300   . chlorhexidine gluconate (MEDLINE KIT)  15 mL Mouth Rinse BID  . Chlorhexidine Gluconate Cloth  6 each Topical Daily  . docusate  100 mg Per Tube BID  . feeding supplement (PROSource TF)  45 mL Per Tube Daily  . free water  30 mL Per Tube Q4H  . heparin injection (subcutaneous)  5,000 Units Subcutaneous Q8H  . insulin aspart  0-20 Units Subcutaneous Q4H  . insulin glargine  15 Units Subcutaneous QHS  . mouth rinse  15 mL Mouth Rinse 10 times per day  . multivitamin  1 tablet Per Tube QHS  . pantoprazole (PROTONIX) IV  40 mg Intravenous Q24H  . polyethylene glycol  17 g Per Tube Daily  . sodium chloride flush  3 mL Intravenous Q12H  . sodium chloride flush  5 mL Intracatheter Q8H  . sodium chloride flush  5 mL Intracatheter Q8H   sodium chloride, acetaminophen, albuterol, docusate, fentaNYL (SUBLIMAZE) injection, melatonin, ondansetron (ZOFRAN) IV, polyethylene glycol, sodium chloride flush  Assessment/ Plan:  65 y.o. male with hypertension, diabetes mellitus type II, bladder cancer, admitted on 09/25/2020 for Septic shock ([place]) [A41.9, R65.21] Altered mental status, unspecified altered mental status type [R41.82] Postprocedural intraabdominal abscess [T81.43XA] Sepsis with acute renal failure and septic shock, due to unspecified organism, unspecified acute renal failure type ([place]) [A41.9, R65.21, N17.9]   #Renal Patient admitted with AKI AKI likely secondary to ATN from sepsis  Patient has baseline creatinine of 1.1, GFR >60 on 08/19/20. Patient had a peak creatinine of 6.13 on May 16. Patient did have an earlier episode of AKI in February admission as well when patient's peak creatinine was 3.13 Patient required renal replacement therapy on May 16 and May 18. Thereafter patient urine output has improved   Lab Results  Component Value Date   CREATININE 3.40 (H) 10/05/2020   CREATININE 3.99 (H) 10/04/2020    CREATININE 4.55 (H) 10/03/2020   05/21 0701 - 05/22 0700 In: 3227.3 [I.V.:40.9; ST/MH:9622.2; IV Piggyback:348.9] Out: 9798 [Urine:1725; Drains:20; Stool:30]     # Sepsis, Gall bladder Abscess, S/p robotic assisted Lap [place] 09/08/2020 S/p CT guided GB abscess drain placement by VIR, on 5/12 Blood culture positive for Bacteroides and enterococcus on 5/12 - Off pressors - zosyn, fluconazole   # hyperphosphatemia -in setting of renal failure  -Now better e Lab Results  Component Value Date   CALCIUM 7.9 (L) 10/05/2020   PHOS 4.4 10/05/2020  Phosphorus above goal  # Acute resp failure intubated 09/27/20-09/30/2020 Improving   #Anemia of critical illness Patient hemoglobin is low at 10 No need for PRBC at this time   # Hypokalemia Being replete    # Hypernatremia wil increase free water inatke  Plan  Patient has adequate urine output -No need for renal placement therapy today We will replete potassium and water    LOS: [place] 5/22/20222:18 PM  [place], [place]

## 2020-10-05 NOTE — NC FL2 (Signed)
[place] LEVEL OF CARE SCREENING TOOL     IDENTIFICATION  Patient Name: Richard Oconnell Birthdate: 21-Oct-1955 Sex: male Admission Date (Current Location): 09/25/2020  [place] and [place] Number:  Engineering geologist and Address:  [facility], [address] [place], [place] [address]      Provider Number: [id number]  Attending Physician Name and Address:  Tyler Pita, MD  Relative Name and Phone Number:  Khiyan, Crace (Brother)   [phone], [place] Qua [phone]    Current Level of Care: Hospital Recommended Level of Care: [place] Prior Approval Number:    Date Approved/Denied:   PASRR Number: [id number]  Discharge Plan: SNF    Current Diagnoses: Patient Active Problem List   Diagnosis Date Noted  . Acute respiratory failure ([place]) 09/28/2020  . Encounter for nasogastric (NG) tube placement   . Altered mental status   . Abscess   . Acute kidney failure, unspecified ([place])   . Sepsis with acute renal failure and septic shock ([place]) 09/25/2020  . S/P laparoscopic cholecystectomy 09/08/2020  . Metabolic syndrome 12/87/8676  . Class 1 obesity due to excess calories with serious comorbidity and body mass index (BMI) of 33.0 to 33.9 in adult 07/06/2019  . Malignant neoplasm of urinary bladder ([place]) 12/06/2018  . Overweight (BMI 25.0-29.9) 07/04/2018  . Hyperlipidemia associated with type 2 diabetes mellitus ([place]) 11/30/2016  . 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 Gross hematuria 11/16/2016  . Erectile dysfunction associated with type 2 diabetes mellitus ([place]) 05/26/2016  . Diabetes mellitus with coincident hypertension ([place]) 06/11/2015  . Type 2 diabetes mellitus with diabetic polyneuropathy, without long-term current use of insulin ([place]) 06/11/2015    Orientation RESPIRATION BLADDER Height & Weight     Self,Situation,Place  Normal Incontinent Weight: 242 lb 4.6 oz (109.9 kg) Height:  6'  3.98" (193 cm)  BEHAVIORAL SYMPTOMS/MOOD NEUROLOGICAL BOWEL NUTRITION STATUS      Incontinent Diet  AMBULATORY STATUS COMMUNICATION OF NEEDS Skin   Limited Assist Verbally Normal                       Personal Care Assistance Level of Assistance  Bathing,Feeding,Dressing,Total care Bathing Assistance: Limited assistance Feeding assistance: Limited assistance Dressing Assistance: Limited assistance Total Care Assistance: Limited assistance   Functional Limitations Info  Sight,Hearing,Speech Sight Info: Adequate Hearing Info: Adequate Speech Info: Adequate    SPECIAL CARE FACTORS FREQUENCY  PT (By licensed PT),OT (By licensed OT)     PT Frequency: 5X per week OT Frequency: 5X per week            Contractures Contractures Info: Not present    Additional Factors Info         Moderna COVID-19 Vaccine 12/28/2019 , 11/30/2019             Current Medications (10/05/2020):  This is the current hospital active medication list Current Facility-Administered Medications  Medication Dose Route Frequency Provider Last Rate Last Admin  . 0.9 %  sodium chloride infusion  250 mL Intravenous Continuous Blake Divine, MD   Held at 09/25/20 1647  . 0.9 %  sodium chloride infusion  250 mL Intravenous PRN Flora Lipps, MD      . 0.9 %  sodium chloride infusion  250 mL Intravenous Continuous Flora Lipps, MD   Stopped at 10/04/20 1112  . acetaminophen (TYLENOL) tablet 650 mg  650 mg Per Tube Q4H PRN Dorothe Pea, RPH   650 mg at 10/02/20  2353  . albuterol (PROVENTIL) (2.5 MG/3ML) 0.083% nebulizer solution 2.5 mg  2.5 mg Nebulization Q4H PRN Tyler Pita, MD      . Ampicillin-Sulbactam (UNASYN) 3 g in sodium chloride 0.9 % 100 mL IVPB  3 g Intravenous Q12H Berton Mount, RPH   Stopped at 10/04/20 2247  . chlorhexidine gluconate (MEDLINE KIT) (PERIDEX) 0.12 % solution 15 mL  15 mL Mouth Rinse BID Flora Lipps, MD   15 mL at 10/05/20 0757  . Chlorhexidine Gluconate Cloth  2 % PADS 6 each  6 each Topical Daily Flora Lipps, MD   6 each at 10/02/20 0509  . docusate (COLACE) 50 MG/5ML liquid 100 mg  100 mg Per Tube BID Rust-Chester, Britton L, NP   100 mg at 10/01/20 2130  . docusate (COLACE) 50 MG/5ML liquid 100 mg  100 mg Per Tube Daily PRN Dorothe Pea, RPH      . feeding supplement (PROSource TF) liquid 45 mL  45 mL Per Tube Daily Tyler Pita, MD   45 mL at 10/04/20 1106  . feeding supplement (VITAL 1.5 CAL) liquid 1,000 mL  1,000 mL Per Tube Continuous Tyler Pita, MD 75 mL/hr at 10/04/20 1719 1,000 mL at 10/04/20 1719  . fentaNYL (SUBLIMAZE) injection 50 mcg  50 mcg Intravenous Q1H PRN Flora Lipps, MD   50 mcg at 09/27/20 1002  . fluconazole (DIFLUCAN) IVPB 400 mg  400 mg Intravenous Q24H Leonel Ramsay, MD 100 mL/hr at 10/04/20 2308 Infusion Verify at 10/04/20 2308  . free water 30 mL  30 mL Per Tube Q4H Tyler Pita, MD   30 mL at 10/05/20 0451  . heparin injection 5,000 Units  5,000 Units Subcutaneous Q8H Dallie Piles, RPH   5,000 Units at 10/05/20 6144  . insulin aspart (novoLOG) injection 0-20 Units  0-20 Units Subcutaneous Q4H Rust-Chester, Britton L, NP   7 Units at 10/05/20 0757  . insulin glargine (LANTUS) injection 15 Units  15 Units Subcutaneous QHS Rust-Chester, Britton L, NP   15 Units at 10/04/20 2217  . MEDLINE mouth rinse  15 mL Mouth Rinse 10 times per day Flora Lipps, MD   15 mL at 10/05/20 0608  . melatonin tablet 5 mg  5 mg Oral QHS PRN Awilda Bill, NP      . multivitamin (RENA-VIT) tablet 1 tablet  1 tablet Per Tube QHS Tyler Pita, MD   1 tablet at 10/04/20 2218  . ondansetron (ZOFRAN) injection 4 mg  4 mg Intravenous Q6H PRN Flora Lipps, MD      . pantoprazole (PROTONIX) injection 40 mg  40 mg Intravenous Q24H Flora Lipps, MD   40 mg at 10/04/20 1713  . polyethylene glycol (MIRALAX / GLYCOLAX) packet 17 g  17 g Per Tube Daily Rust-Chester, Britton L, NP   17 g at 10/01/20 1100  . polyethylene  glycol (MIRALAX / GLYCOLAX) packet 17 g  17 g Per Tube Daily PRN Dorothe Pea, RPH      . sodium chloride flush (NS) 0.9 % injection 3 mL  3 mL Intravenous Q12H Flora Lipps, MD   3 mL at 10/04/20 2220  . sodium chloride flush (NS) 0.9 % injection 3 mL  3 mL Intravenous PRN Flora Lipps, MD   3 mL at 10/03/20 1327  . sodium chloride flush (NS) 0.9 % injection 5 mL  5 mL Intracatheter Q8H Criselda Peaches, MD   5 mL at 10/05/20 0608  .  sodium chloride flush (NS) 0.9 % injection 5 mL  5 mL Intracatheter Q8H Arne Cleveland, MD   5 mL at 10/05/20 0608     Discharge Medications: Please see discharge summary for a list of discharge medications.  Relevant Imaging Results:  Relevant Lab Results:   Additional Information SS# [id number]  Adelene Amas, LCSWA

## 2020-10-05 NOTE — Progress Notes (Signed)
[name], male   DOB: 12/28/1955, 65 y.o.   MRN: [id number]     [place] Hospital Day(s): 10.   Post op day(s):  Marland Kitchen   Interval History: Patient seen and examined, no acute events or new complaints overnight. Patient reports feeling tired.  The patient today he started to be more awake but still with significant [place]ing of the condition.  Unable to express specific pain.  There has been no episode of nausea or vomiting.  No significant events.  Vital signs in last 24 hours: [min-max] current  Temp:  [98.2 F (36.8 C)-99.9 F (37.7 C)] 98.2 F (36.8 C) (05/22 0753) Pulse Rate:  [42-74] 62 (05/22 1300) Resp:  [18-31] 31 (05/22 1300) BP: (122-183)/(62-133) 153/97 (05/22 1300) SpO2:  [86 %-95 %] 93 % (05/22 1300) Weight:  [109.9 kg] 109.9 kg (05/22 0423)     Height: 6' 3.98" (193 cm) Weight: 109.9 kg BMI (Calculated): 29.5   Physical Exam:  Constitutional: alert, cooperative and no distress  Respiratory: breathing non-labored at rest  Cardiovascular: regular rate and sinus rhythm  Gastrointestinal: soft, non-tender, and non-distended.  Drain in the right upper quadrant in place, purulent.  Labs:  CBC Latest Ref Rng & Units 10/05/2020 10/04/2020 10/03/2020  WBC 4.0 - 10.5 K/uL 13.3(H) 14.6(H) 14.7(H)  Hemoglobin 13.0 - 17.0 g/dL 10.5(L) 10.0(L) 11.5(L)  Hematocrit 39.0 - 52.0 % 32.0(L) 30.1(L) 34.1(L)  Platelets 150 - 400 K/uL 316 273 316   CMP Latest Ref Rng & Units 10/05/2020 10/04/2020 10/03/2020  Glucose 70 - 99 mg/dL 209(H) 190(H) 263(H)  BUN 8 - 23 mg/dL 76(H) 73(H) 67(H)  Creatinine 0.61 - 1.24 mg/dL 3.40(H) 3.99(H) 4.55(H)  Sodium 135 - 145 mmol/L 148(H) 144 143  Potassium 3.5 - 5.1 mmol/L 3.2(L) 3.0(L) 3.4(L)  Chloride 98 - 111 mmol/L 111 106 105  CO2 22 - 32 mmol/L 26 27 25   Calcium 8.9 - 10.3 mg/dL 7.9(L) 7.7(L) 7.5(L)  Total Protein 6.5 - 8.1 g/dL - - 5.6(L)  Total Bilirubin 0.3 - 1.2 mg/dL - - 1.2  Alkaline Phos 38 - 126 U/L - - 168(H)   AST 15 - 41 U/L - - 39  ALT 0 - 44 U/L - - 24    Imaging studies: No new pertinent imaging studies   Assessment/Plan:  65 y.o. male with sepsis due to intra-abdominal abscess s/p percutaneous drainage, complicated by pertinent comorbidities including type 2 diabetes, hypertension, hyperlipidemia.  Patient today without fever.  White blood cell continue slowly trending down.  There will be no nausea and vomiting.  Patient tolerating tube feedings.  Drain working adequately.  Patient still with severe weakness and deconditioning.  From surgical standpoint there is no change in the management.  Appreciate PCCM and consultants for their input and recommendations.  We will continue to follow closely.  Arnold Long, MD

## 2020-10-05 NOTE — TOC Progression Note (Signed)
Transition of Care [facility]) - Progression Note    Patient Details  Name: Thatcher Doberstein MRN: [id number] Date of Birth: 1955-11-09  [facility]) CM/SW [place], [place] Phone Number: [phone] 10/05/2020, 2:03 PM  Clinical Narrative:     PT/OT have recommended SNF for rehab.  CSW left voicemail for patient's sister Luanna Salk [phone], to discuss placement process.     Expected Discharge Plan: [place] Barriers to Discharge: Continued Medical Work up  Expected Discharge Plan and Services Expected Discharge Plan: [place] In-house Referral: Clinical Social Work   Post Acute Care Choice: [place] Living arrangements for the past 2 months: Single Family Home                                       Social Determinants of Health (SDOH) Interventions    Readmission Risk Interventions No flowsheet data found.

## 2020-10-05 NOTE — Progress Notes (Addendum)
NAME:  Richard Oconnell, MRN:  [id number], DOB:  [birth year], LOS: 63 ADMISSION DATE:  09/25/2020 REFERRING MD:  Charna Archer CHIEF COMPLAINT:  Feeling bad and belly pain  Brief Pt Description/Synopsis:  65 yo male with previous dx of acute cholecystitis s/p cholecystotomy tube 2/24 and then follow up robotic assisted cholecystectomy 4/25, now presents with large abdominal abscess with severe septic shock and BACTEREMIA, with multiorgan failure (liver and renal failure) complicated by severe metabolic acidosis and toxic metabolic encephalopathy.  Background:  65 year old man past medical history of hypertension, diabetes, and bladder cancer who was hospitalized with acute cholecystitis-several months ago 2/24 He was temporized with a percutaneous cholecystostomy tube.    Follow up  cholangiogram via the tube demonstrates that the cystic duct is still obstructed He was offered him a robot-assisted laparoscopic cholecystectomy, but  deferred this procedure  On 4/25 recently had cholecystectomy along with significant lysis of adhesions on 4/25 here at [facility].  He presents today with somnolence and confusion.   Family reports that patient is disoriented the day before yesterday, after which his skin has begun to appear increasingly yellow.    +discomfort in the right upper quadrant of his abdomen, liver failure, renal failure, severe acidosis Patient started on pressors, admitted for septic shock  WBC 25 LA 6.5-->8.7 CREAT 4.8 TB 9.5   Significant Hospital Events: Including procedures, antibiotic start and stop dates in addition to other pertinent events   2/24 admitted for acute cholecystitis,percutaneous cholecystostomy tube placed by IR  3/29 offered  a robot-assisted laparoscopic cholecystectomy, but deferred  4/25 s/p cholecystectomy along with significant lysis of adhesions on 4/25 here at [facility]. 5/12 admission to ICU for large abd abscess, jaundice and septic shock, on pressors 5/12 CT  abd-Large abscess in the gallbladder fossa, at least 10 cm in size; Percutaneous drain placed by IR 5/13: Nephrology following, starting Bicarb gtt, remains on Levophed; Blood cultures growing Enterococcus faecalis,Bacteroides fragilis.  Pt extremely delirious and agitated, trying to leave to go home, interfering with medical treatment, plan to start Precedex. High risk for intubation due to severe Encephalopathy 5/14severe sepsis, high risk for intubation, on precedex,  5/14 INTUBATED AND LEFT TRIALYSIS CATHETER PLACED 5/16: Plan for SBT following Hemodialysis.  Add Fluconazole for intraabdominal coverage 5/17: Repeat CT Abdomen/Pelvis @ 09:00.  Plan for SBT and hopeful for extubation as pt following commands this morning. Levophed currently weaned off. 5/18: Extubated yesterday, now on room air.  Remains encephalopathic, repeat CT head negative yesterday.  To get Hemodialysis today. 5/19: Abdominal drain exchanged by IR 5/20: Remains encephalopathic, poorly interactive 5/21: Remains encephalopathic, MRI brain ordered, NTS ordered, poor cough mechanics and clearance of secretions 5/22: MRI brain showed no acute abnormality, chronic microvascular disease.  Encephalopathic but appears to be more responsive today.  Still with terrible cough mechanics.  Micro Data:  09/25/2020: SARS-CoV-2 PCR>> negative 09/25/2020: Influenza PCR>> negative 09/25/2020: Blood culture x2>>Enterococcus faecalis,Bacteroides fragilis 09/25/2020: MRSA PCR>> negative 09/25/2020: Aerobic/anaerobic culture from abdominal abscess>> ENTEROCOCCUS FAECALIS, BACTEROIDES FRAGILIS, BETA LACTAMASE POSITIVE (sensitive to Ampicillin, Gentamycin, Vancomycin) 09/26/2020: Urine>>no growth 09/30/2020: Blood culture>>no growth, 4 days (5/21)  Antimicrobials:  Zosyn 5/12>> 5/19 Fluconazole 5/16>> Unasyn 5/19>>  Antibiotics Given (last 72 hours)    Date/Time Action Medication Dose Rate   10/02/20 1140 New Bag/Given    piperacillin-tazobactam (ZOSYN) IVPB 3.375 g 3.375 g 12.5 mL/hr   10/02/20 2244 New Bag/Given   Ampicillin-Sulbactam (UNASYN) 3 g in sodium chloride 0.9 % 100 mL IVPB 3 g 200 mL/hr  10/03/20 1053 New Bag/Given   Ampicillin-Sulbactam (UNASYN) 3 g in sodium chloride 0.9 % 100 mL IVPB 3 g 200 mL/hr   10/03/20 2216 New Bag/Given   Ampicillin-Sulbactam (UNASYN) 3 g in sodium chloride 0.9 % 100 mL IVPB 3 g 200 mL/hr   10/04/20 1112 New Bag/Given   Ampicillin-Sulbactam (UNASYN) 3 g in sodium chloride 0.9 % 100 mL IVPB 3 g 200 mL/hr   10/04/20 2216 New Bag/Given   Ampicillin-Sulbactam (UNASYN) 3 g in sodium chloride 0.9 % 100 mL IVPB 3 g 200 mL/hr      Interval History:  Slightly more interactive but still encephalopathic.  Requiring NTS.  No respiratory distress.  Objective   Blood pressure 136/76, pulse 67, temperature 98.2 F (36.8 C), temperature source Oral, resp. rate (!) 27, height 6' 3.98" (1.93 m), weight 109.9 kg, SpO2 94 %.        Intake/Output Summary (Last 24 hours) at 10/05/2020 0844 Last data filed at 10/05/2020 0800 Gross per 24 hour  Intake 3387.32 ml  Output 2195 ml  Net 1192.32 ml   Filed Weights   10/03/20 0445 10/04/20 0442 10/05/20 0423  Weight: 111.2 kg 107.1 kg 109.9 kg     REVIEW OF SYSTEMS Unable to assess due to altered mental status   PHYSICAL EXAMINATION: GENERAL:acutely ill appearing made, laying in bed, on room air, in NAD, poor cough mechanics HEAD: Normocephalic EYES: Pupils equal, round, reactive to light.  No scleral icterus.  MOUTH: Moist oral mucosa, no thrush. NECK: Supple. No JVD present  PULMONARY: faint rhonchi throughout, even, non labored  CARDIOVASCULAR: irregular irregular, no R/G, 2+ radial/2+ distal pulses GASTROINTESTINAL: distended, +BS x4, non tender Percutaneous drain to RUQ is clean dry and intact   MUSCULOSKELETAL: No deformities. Generalized weakness. No swelling, clubbing, 1-2+ edema. NEUROLOGIC: Awake. Oriented to self  only. Follows simple commands inconsistently. PERRLA SKIN: Warm and dry.  No obvious rashes, lesions, or ulcerations  Labs/imaging that I havepersonally reviewed  (right click and "Reselect all SmartList Selections" daily)  Labs 10/01/2020: K 3.2, glucose 209, BUN 92, Cr. 5.65, albumin 2, WBC 18.9, Hgb 11.3, HCT 33.4 CT Abdomen & Pelvis 5/17>>1. Collection of gas and fluid in the gallbladder fossa with small locules of gas also tracking along the pigtail catheter drain along the peritoneal margin of the right anterior abdominal wall. Given that we are over 3 weeks out from cholecystectomy, this collection is abnormal and is compatible with abscess. As on prior exams, the adjacent hepatic flexure is somewhat indistinct with some spanning stranding, making it difficult to completely exclude a fistula between the gallbladder fossa region and the colon although no obvious linear collection of extraluminal gas is seen. 2. Third spacing of fluid with ascites, widespread mesenteric and subcutaneous edema. Retroperitoneal edema on the left. 3. Bibasilar atelectasis with small bilateral pleural effusions. 4.  Aortic Atherosclerosis (ICD10-I70.0).  Coronary atherosclerosis. 5. Multilevel thoracic and lumbar impingement. 6. Transverse, descending, and sigmoid colon diverticulosis. 7. Nonspecific sclerotic lesion in the left eighth rib laterally, no change from 2018, highly likely to be benign. 8. Potentially complex exophytic lesion from the left kidney upper pole, possibly a complex cyst although a small mass is not excluded. CT Head w/o contrast 5/17>>No CT evidence of acute intracranial abnormality. Stable non-contrast CT appearance of the brain as compared to 09/25/2020.Mild cerebral atrophy and chronic small vessel ischemic disease. Labs 10/03/2020: alk phos 168, wbc 14.7, hgb 11.5  Labs 10/04/2020: WBC 14.6, Hgb 10.0 Labs  10/05/2020: WBC 13.3, Hgb  10.5, platelets 316 BUN/creatinine 76/3.4,  albumin 2.9   ASSESSMENT/PLAN   Acute Hypoxic Respiratory Failure in the setting of severe metabolic derangements due to septic shock, AKI, and Metabolic Encephalopathy -Extubated 09/30/20, no respiratory distress, on room air -Follow intermittent CXR  -PRN Bronchodilators -Pulmonary toilet, continue NTS as needed  Septic Shock~resolved Atrial fibrillation with rvr  Echocardiogram 09/27/20 with LVEF 60-65%, grade II diastolic dysfunction, RV systolic function normal; normal valves & no vegetations -Continuous cardiac monitoring -Maintain MAP greater than 65 -Developed issues with junctional rhythm/bradycardia -Amiodarone discontinued, no recurrent issues with junctional rhythm -Currently NSR -Not a candidate for full anticoagulation at present due to intra-abdominal issues  Severe sepsis due to intra-abdominal abscess and BACTEREMIA (Enterococcus faecalis, Bacteroides fragilis) -Monitor fever curve -Trend WBCs and procalcitonin -Follow cultures as above -ID consulted appreciate input~per recommendations abx adjusted to ampicillin/sulbactam per sensitivities  and continue fluconazole 05/19 -Status post Percutaneous drain placement by IR into gallbladder fossa abscess on 09/25/2020 for source  control -Percutaneous drain exchange by IR 5/19  -General surgery following, appreciate input  Acute Kidney Injury Anion Gap Metabolic Acidosis -Trend BMP  -Avoid nephrotoxic medications as able -Replace electrolytes as indicated -Nephrology following, appreciate input -HD as per Nephrology  Acute toxic metabolic encephalopathy, suspect in setting of Sepsis, AKI, and sleep deprivation~Urine drug screen + for benzodiazepines 09/26/2020 -Provide supportive care -Melatonin qhs prn and promote normal sleep/wake cycle -Avoid sedating meds as able -CT Head 09/30/20 negative -Urine drug screen + for Benzodiazepines -MRI brain showed no acute abnormalities, chronic microvascular disease -PT/OT,  reorientation  Anemia without s/sx obvious bleeding  -Monitor for S/Sx of bleeding -Trend CBC -SCD's for VTE Prophylaxis  -Transfuse for Hgb <7 -May be dilutional effect due to poor renal function hypervolemia  Hyperglycemia -CBG's -SSI -Follow ICU Hypo/Hyperglycemia protocol.  Best practice (right click and "Reselect all SmartList Selections" daily)  Diet: NPO, tube feeds Pain/Anxiety/Delirium protocol (if indicated): N/A VAP protocol (if indicated): N/A DVT prophylaxis: Heparin SQ GI prophylaxis: PPI Glucose control:  SSI, Lantus Central venous access:  Yes, and still needed Arterial line:  N/A Foley:  Yes, and still needed Mobility:  OOB as tolerated, PT/OT ordered PT consulted: N/A Code Status:  full code Disposition: SDU  Plan to transfer to hospitalist service in a.m.   Labs   CBC: Recent Labs  Lab 09/29/20 0456 09/30/20 0514 10/01/20 0346 10/02/20 0305 10/03/20 0356 10/04/20 0627 10/05/20 0410  WBC 27.0*   < > 18.9* 21.6* 14.7* 14.6* 13.3*  NEUTROABS 23.2*  --   --   --   --   --  9.8*  HGB 10.5*   < > 11.3* 12.3* 11.5* 10.0* 10.5*  HCT 30.1*   < > 33.4* 36.6* 34.1* 30.1* 32.0*  MCV 78.0*   < > 79.7* 79.9* 80.4 82.7 82.9  PLT 475*   < > 381 379 316 273 316   < > = values in this interval not displayed.    Basic Metabolic Panel: Recent Labs  Lab 10/01/20 0340 10/01/20 0346 10/01/20 2236 10/02/20 0305 10/03/20 0356 10/04/20 0627 10/05/20 0410  NA 136  --   --  139 143 144 148*  K 3.2*  --  3.1* 3.3* 3.4* 3.0* 3.2*  CL 95*  --   --  100 105 106 111  CO2 27  --   --  _0 GLUCOSE 209*  --   --  197* 263* 190* 209*  BUN 92*  --   --  64* 67* 73* 76*  CREATININE 5.65*  --   --  4.40* 4.55* 3.99* 3.40*  CALCIUM 7.1*  --   --  7.3* 7.5* 7.7* 7.9*  MG  --  2.4  --  2.2 2.2 2.3 2.0  PHOS 6.6*  --   --  3.8 4.7* 4.3 4.4   GFR: Estimated Creatinine Clearance: 29.4 mL/min (A) (by C-G formula based on SCr of 3.4 mg/dL (H)). Recent Labs  Lab  10/02/20 0305 10/03/20 0356 10/04/20 0627 10/05/20 0410  WBC 21.6* 14.7* 14.6* 13.3*    Liver Function Tests: Recent Labs  Lab 09/29/20 0456 09/30/20 0514 10/01/20 0340 10/02/20 0305 10/03/20 0356 10/04/20 0627 10/05/20 0410  AST 55*  --   --   --  39  --   --   ALT 42  --   --   --  24  --   --   ALKPHOS 213*  --   --   --  168*  --   --   BILITOT 2.2*  --   --   --  1.2  --   --   PROT 5.8*  --   --   --  5.6*  --   --   ALBUMIN 1.8*   < > 2.0* 2.0* 1.9*  1.9* 1.9* 2.0*   < > = values in this interval not displayed.   No results for input(s): LIPASE, AMYLASE in the last 168 hours. Recent Labs  Lab 09/30/20 1751  AMMONIA 20    ABG    Component Value Date/Time   PHART 7.35 09/27/2020 1155   PCO2ART 42 09/27/2020 1155   PO2ART 71 (L) 09/27/2020 1155   HCO3 23.2 09/27/2020 1155   ACIDBASEDEF 2.3 (H) 09/27/2020 1155   O2SAT 93.1 09/27/2020 1155     Coagulation Profile: Recent Labs  Lab 10/04/20 0627  INR 1.2    Cardiac Enzymes: No results for input(s): CKTOTAL, CKMB, CKMBINDEX, TROPONINI in the last 168 hours.  HbA1C: Hemoglobin A1C  Date/Time Value Ref Range Status  12/27/2019 12:00 AM 6.9  Final    Comment:    [facility]   Hgb A1c MFr Bld  Date/Time Value Ref Range Status  09/26/2020 04:20 AM 6.7 (H) 4.8 - 5.6 % Final    Comment:    (NOTE) Pre diabetes:          5.7%-6.4%  Diabetes:              >6.4%  Glycemic control for   <7.0% adults with diabetes   05/30/2020 10:47 AM 7.2 (H) <5.7 % of total Hgb Final    Comment:    For someone without known diabetes, a hemoglobin A1c value of 6.5% or greater indicates that they may have  diabetes and this should be confirmed with a follow-up  test. . For someone with known diabetes, a value <7% indicates  that their diabetes is well controlled and a value  greater than or equal to 7% indicates suboptimal  control. A1c targets should be individualized based on  duration of diabetes, age,  comorbid conditions, and  other considerations. . Currently, no consensus exists regarding use of hemoglobin A1c for diagnosis of diabetes for children. .     CBG: Recent Labs  Lab 10/04/20 1554 10/04/20 1954 10/04/20 2333 10/05/20 0353 10/05/20 0724  GLUCAP 149* 191* 219* 202* 217*    Allergies Allergies  Allergen Reactions  . Cyclobenzaprine Other (See Comments)    Not improving pain and  makes him agressive.  . Lisinopril Cough     Scheduled Meds: . chlorhexidine gluconate (MEDLINE KIT)  15 mL Mouth Rinse BID  . Chlorhexidine Gluconate Cloth  6 each Topical Daily  . docusate  100 mg Per Tube BID  . feeding supplement (PROSource TF)  45 mL Per Tube Daily  . free water  30 mL Per Tube Q4H  . heparin injection (subcutaneous)  5,000 Units Subcutaneous Q8H  . insulin aspart  0-20 Units Subcutaneous Q4H  . insulin glargine  15 Units Subcutaneous QHS  . mouth rinse  15 mL Mouth Rinse 10 times per day  . multivitamin  1 tablet Per Tube QHS  . pantoprazole (PROTONIX) IV  40 mg Intravenous Q24H  . polyethylene glycol  17 g Per Tube Daily  . sodium chloride flush  3 mL Intravenous Q12H  . sodium chloride flush  5 mL Intracatheter Q8H  . sodium chloride flush  5 mL Intracatheter Q8H   Continuous Infusions: . sodium chloride Stopped (09/25/20 1647)  . sodium chloride    . sodium chloride Stopped (10/04/20 1112)  . ampicillin-sulbactam (UNASYN) IV Stopped (10/04/20 2247)  . feeding supplement (VITAL 1.5 CAL) 1,000 mL (10/04/20 1719)  . fluconazole (DIFLUCAN) IV 100 mL/hr at 10/04/20 2308   PRN Meds:.sodium chloride, acetaminophen, albuterol, docusate, fentaNYL (SUBLIMAZE) injection, melatonin, ondansetron (ZOFRAN) IV, polyethylene glycol, sodium chloride flush  Updated patient's sister and brother at bedside.   Renold Don, MD [place] [facility]   *This note was dictated using voice recognition software/Dragon.  Despite best efforts to proofread, errors can occur  which can change the meaning.  Any change was purely unintentional.

## 2020-10-06 DIAGNOSIS — T8149XA Infection following a procedure, other surgical site, initial encounter: Secondary | ICD-10-CM

## 2020-10-06 DIAGNOSIS — T8143XA Infection following a procedure, organ and space surgical site, initial encounter: Secondary | ICD-10-CM | POA: Diagnosis not present

## 2020-10-06 DIAGNOSIS — R1900 Intra-abdominal and pelvic swelling, mass and lump, unspecified site: Secondary | ICD-10-CM

## 2020-10-06 DIAGNOSIS — J9601 Acute respiratory failure with hypoxia: Secondary | ICD-10-CM | POA: Diagnosis not present

## 2020-10-06 DIAGNOSIS — A419 Sepsis, unspecified organism: Secondary | ICD-10-CM | POA: Diagnosis not present

## 2020-10-06 DIAGNOSIS — Z7189 Other specified counseling: Secondary | ICD-10-CM | POA: Diagnosis not present

## 2020-10-06 DIAGNOSIS — R4182 Altered mental status, unspecified: Secondary | ICD-10-CM | POA: Diagnosis not present

## 2020-10-06 DIAGNOSIS — R6520 Severe sepsis without septic shock: Secondary | ICD-10-CM | POA: Diagnosis not present

## 2020-10-06 DIAGNOSIS — N170 Acute kidney failure with tubular necrosis: Secondary | ICD-10-CM | POA: Diagnosis not present

## 2020-10-06 DIAGNOSIS — I4891 Unspecified atrial fibrillation: Secondary | ICD-10-CM | POA: Diagnosis not present

## 2020-10-06 DIAGNOSIS — N179 Acute kidney failure, unspecified: Secondary | ICD-10-CM | POA: Diagnosis not present

## 2020-10-06 DIAGNOSIS — K651 Peritoneal abscess: Secondary | ICD-10-CM

## 2020-10-06 DIAGNOSIS — B9689 Other specified bacterial agents as the cause of diseases classified elsewhere: Secondary | ICD-10-CM | POA: Diagnosis not present

## 2020-10-06 DIAGNOSIS — A4189 Other specified sepsis: Secondary | ICD-10-CM | POA: Diagnosis not present

## 2020-10-06 DIAGNOSIS — A4181 Sepsis due to Enterococcus: Secondary | ICD-10-CM | POA: Diagnosis not present

## 2020-10-06 LAB — COMPREHENSIVE METABOLIC PANEL
ALT: 62 U/L — ABNORMAL HIGH (ref 0–44)
AST: 67 U/L — ABNORMAL HIGH (ref 15–41)
Albumin: 2.3 g/dL — ABNORMAL LOW (ref 3.5–5.0)
Alkaline Phosphatase: 156 U/L — ABNORMAL HIGH (ref 38–126)
Anion gap: 10 (ref 5–15)
BUN: 73 mg/dL — ABNORMAL HIGH (ref 8–23)
CO2: 25 mmol/L (ref 22–32)
Calcium: 8 mg/dL — ABNORMAL LOW (ref 8.9–10.3)
Chloride: 113 mmol/L — ABNORMAL HIGH (ref 98–111)
Creatinine, Ser: 2.79 mg/dL — ABNORMAL HIGH (ref 0.61–1.24)
GFR, Estimated: 24 mL/min — ABNORMAL LOW (ref >60)
Glucose, Bld: 210 mg/dL — ABNORMAL HIGH (ref 70–99)
Potassium: 3.6 mmol/L (ref 3.5–5.1)
Sodium: 148 mmol/L — ABNORMAL HIGH (ref 135–145)
Total Bilirubin: 1.2 mg/dL (ref 0.3–1.2)
Total Protein: 6.4 g/dL — ABNORMAL LOW (ref 6.5–8.1)

## 2020-10-06 LAB — CBC WITH DIFFERENTIAL/PLATELET
Abs Immature Granulocytes: 0.09 10*3/uL — ABNORMAL HIGH (ref 0.00–0.07)
Basophils Absolute: 0.1 10*3/uL (ref 0.0–0.1)
Basophils Relative: 1 %
Eosinophils Absolute: 0.3 10*3/uL (ref 0.0–0.5)
Eosinophils Relative: 2 %
HCT: 31.2 % — ABNORMAL LOW (ref 39.0–52.0)
Hemoglobin: 10.1 g/dL — ABNORMAL LOW (ref 13.0–17.0)
Immature Granulocytes: 1 %
Lymphocytes Relative: 12 %
Lymphs Abs: 1.8 10*3/uL (ref 0.7–4.0)
MCH: 27.1 pg (ref 26.0–34.0)
MCHC: 32.4 g/dL (ref 30.0–36.0)
MCV: 83.6 fL (ref 80.0–100.0)
Monocytes Absolute: 1.1 10*3/uL — ABNORMAL HIGH (ref 0.1–1.0)
Monocytes Relative: 7 %
Neutro Abs: 11.8 10*3/uL — ABNORMAL HIGH (ref 1.7–7.7)
Neutrophils Relative %: 77 %
Platelets: 302 10*3/uL (ref 150–400)
RBC: 3.73 MIL/uL — ABNORMAL LOW (ref 4.22–5.81)
RDW: 18 % — ABNORMAL HIGH (ref 11.5–15.5)
WBC: 15.1 10*3/uL — ABNORMAL HIGH (ref 4.0–10.5)
nRBC: 0 % (ref 0.0–0.2)

## 2020-10-06 LAB — GLUCOSE, CAPILLARY
Glucose-Capillary: 160 mg/dL — ABNORMAL HIGH (ref 70–99)
Glucose-Capillary: 162 mg/dL — ABNORMAL HIGH (ref 70–99)
Glucose-Capillary: 163 mg/dL — ABNORMAL HIGH (ref 70–99)
Glucose-Capillary: 166 mg/dL — ABNORMAL HIGH (ref 70–99)
Glucose-Capillary: 169 mg/dL — ABNORMAL HIGH (ref 70–99)
Glucose-Capillary: 182 mg/dL — ABNORMAL HIGH (ref 70–99)
Glucose-Capillary: 182 mg/dL — ABNORMAL HIGH (ref 70–99)

## 2020-10-06 LAB — MAGNESIUM: Magnesium: 2.1 mg/dL (ref 1.7–2.4)

## 2020-10-06 LAB — PHOSPHORUS: Phosphorus: 3.8 mg/dL (ref 2.5–4.6)

## 2020-10-06 MED ORDER — SODIUM CHLORIDE 0.9 % IV SOLN
3.0000 g | Freq: Four times a day (QID) | INTRAVENOUS | Status: DC
  Administered 2020-10-06 – 2020-10-14 (×33): 3 g via INTRAVENOUS
  Filled 2020-10-06: qty 8
  Filled 2020-10-06: qty 3
  Filled 2020-10-06 (×2): qty 8
  Filled 2020-10-06: qty 3
  Filled 2020-10-06 (×2): qty 8
  Filled 2020-10-06 (×4): qty 3
  Filled 2020-10-06: qty 8
  Filled 2020-10-06: qty 3
  Filled 2020-10-06 (×4): qty 8
  Filled 2020-10-06 (×2): qty 3
  Filled 2020-10-06 (×3): qty 8
  Filled 2020-10-06: qty 3
  Filled 2020-10-06 (×3): qty 8
  Filled 2020-10-06 (×3): qty 3
  Filled 2020-10-06 (×2): qty 8
  Filled 2020-10-06 (×5): qty 3
  Filled 2020-10-06 (×2): qty 8
  Filled 2020-10-06: qty 3

## 2020-10-06 MED ORDER — DILTIAZEM HCL-DEXTROSE 125-5 MG/125ML-% IV SOLN (PREMIX)
5.0000 mg/h | INTRAVENOUS | Status: DC
  Administered 2020-10-06: 5 mg/h via INTRAVENOUS
  Filled 2020-10-06: qty 125

## 2020-10-06 MED ORDER — METOPROLOL TARTRATE 5 MG/5ML IV SOLN
5.0000 mg | INTRAVENOUS | Status: DC | PRN
  Administered 2020-10-06: 5 mg via INTRAVENOUS
  Filled 2020-10-06: qty 5

## 2020-10-06 MED ORDER — FLUCONAZOLE IN SODIUM CHLORIDE 200-0.9 MG/100ML-% IV SOLN
200.0000 mg | INTRAVENOUS | Status: AC
  Administered 2020-10-06 – 2020-10-08 (×3): 200 mg via INTRAVENOUS
  Filled 2020-10-06 (×3): qty 100

## 2020-10-06 NOTE — TOC Progression Note (Signed)
Transition of Care [facility]) - Progression Note    Patient Details  Name: Richard Oconnell MRN: [id number] Date of Birth: December 07, 1955  Transition of Care [facility]) CM/SW [place], [place] Phone Number: 10/06/2020, 4:23 PM  Clinical Narrative:     CSW received a voicemail from patient's sister Ms. Lovena Le.  CSW left voicemail for patient's sister Richard Oconnell [phone], to discuss placement process.   Expected Discharge Plan: [place] Barriers to Discharge: Continued Medical Work up  Expected Discharge Plan and Services Expected Discharge Plan: [place] In-house Referral: Clinical Social Work   Post Acute Care Choice: [place] Living arrangements for the past 2 months: Single Family Home                                       Social Determinants of Health (SDOH) Interventions    Readmission Risk Interventions No flowsheet data found.

## 2020-10-06 NOTE — Progress Notes (Signed)
[place] [facility] [place] NAME: Richard Oconnell    MR#:  [id number]  DATE OF BIRTH:  02-Dec-1955  SUBJECTIVE:  transferred to [facility] service. Richard Oconnell more awake. Gives me a thumbs up as a work in the room. Sister and brother at bedside. Currently getting feeding through NG tube. Less agitated. Discussed with Richard Oconnell regarding necessity for modified barium prior to removing the NG tube. Agreeable with plan  no fever. Noted to have tachycardia with a fib RVR.  REVIEW OF SYSTEMS:   Review of Systems  Unable to perform ROS: Medical condition   Tolerating Diet: Tolerating PT:   DRUG ALLERGIES:   Allergies  Allergen Reactions  . Cyclobenzaprine Other (See Comments)    Not improving pain and makes him agressive.  . Lisinopril Cough    VITALS:  Blood pressure (!) 162/97, pulse 60, temperature 98.9 F (37.2 C), temperature source Axillary, resp. rate (!) 21, height 6' 3.98" (1.93 m), weight 108 kg, SpO2 94 %.  PHYSICAL EXAMINATION:   Physical Exam  GENERAL:  65 y.o.-year-old Richard Oconnell lying in the bed with mild to moderate acute distress. Appears chronically ill. HEENT: Head atraumatic, normocephalic. Oropharynx and nasopharynx clear. NG+ , HD cath+ LUNGS: decreased breath sounds bilaterally, no wheezing, rales, rhonchi. No use of accessory muscles of respiration.  CARDIOVASCULAR: S1, S2 normal. No murmurs, rubs, or gallops.  ABDOMEN: Soft,nondistended. No organomegaly or mass. GB drain present Foley+ EXTREMITIES: No cyanosis, clubbing or edema b/l.    NEUROLOGIC: Cranial nerves II through XII are intact. No focal Motor or sensory deficits b/l.   PSYCHIATRIC:  Richard Oconnell is alert however remains sleepy and looks fatigued SKIN: No obvious rash, lesion, or ulcer.   LABORATORY PANEL:  CBC Recent Labs  Lab 10/06/20 0419  WBC 15.1*  HGB 10.1*  HCT 31.2*  PLT 302    Chemistries  Recent Labs  Lab 10/06/20 0419  NA 148*  K 3.6  CL 113*  CO2 25   GLUCOSE 210*  BUN 73*  CREATININE 2.79*  CALCIUM 8.0*  MG 2.1  AST 67*  ALT 62*  ALKPHOS 156*  BILITOT 1.2   Cardiac Enzymes No results for input(s): TROPONINI in the last 168 hours. RADIOLOGY:  No results found. ASSESSMENT AND PLAN:   Richard Oconnell is a 65 year old gentleman with history of hypertension, bladder cancer, diabetes type 2, acute cholecystitis with cholecystectomy tube placed July 10 2020, followed by robotic assisted cholecystectomy September 08, 2020, in the intensive care unit with septic shock, bacteremia, large abdominal abscess, multiorgan failure with complications of severe metabolic acidosis,  atrial fibrillation , acute renal failure with ATN from sepsis and started on hemodialysis.  Acute hypoxic respiratory failure in the setting of severe metabolic encephalopathy due to septic shock, acute kidney injury secondary to ATN in the setting of sepsis -- Richard Oconnell was intubated and remained on the ventilator for several days. -- Extubated on 767 34 now on room air -- PRN bronchodilators, pulmonary toilet.  Septic shock present on admission-- resolved source large abscess intra-abdominal (culture growing Enterococcus faecalis and Bacteroides fragilis) history of acute cholecystitis with cholecystostomy July 06, 2008 followed by robotic assisted cholecystectomy in April 2022 -- WBC fluctuating -- followed by ID. Continue Unasyn and fluconazole -- dispose percutaneous drain placement by IR into gallbladder. Abscess on 09/25/2020 -- she was exchanged by IR on 5/19 -- followed by general surgery  Acute kidney injury in the setting of sepsis due to ATN -- Richard Oconnell  had creatinine of six--- started on hemodialysis. Got two treatments. -- Creatinine trending down 2.7.-- Followed by nephrology. -- Good urine output. Dialysis on hold.  Acute metabolic encephalopathy/toxic in the setting of sepsis AK I -- Richard Oconnell's mentation is improving. -- Sleepy however answers  most questions appropriately -- MRI brain showed no acute abnormality. Chronic microvascular disease  A fib with RVR -- followed by Dr. Rockey Situ [facility] -- 5/23-- now on amiodarone drip  Anemia without sign symptoms of obvious bleeding secondary to chronic illness -- transfuse as needed  Nutrition -- Richard Oconnell currently getting NG tube feeding -- speech therapy to evaluate swallow study with MBS test once Richard Oconnell more awake   Family communication : brother and sister at bedside Consults : nephrology, ID, cardiology, surgery CODE STATUS: full code DVT Prophylaxis : heparin subcu Level of care: Stepdown Status is: Inpatient  Remains inpatient appropriate because:Inpatient level of care appropriate due to severity of illness   Dispo: The Richard Oconnell is from: Home              Anticipated d/c is to: TBD              Richard Oconnell currently is not medically stable to d/c.   Difficult to place Richard Oconnell No  Richard Oconnell is critically ill with multiple comorbidities and medical issues.      TOTAL TIME TAKING CARE OF THIS Richard Oconnell: 35 minutes.  >50% time spent on counselling and coordination of care  Note: This dictation was prepared with Dragon dictation along with smaller phrase technology. Any transcriptional errors that result from this process are unintentional.  Fritzi Mandes M.D    [facility]   CC: Primary care physician; [name], DOPatient ID: Richard Oconnell, male   DOB: 1955-06-10, 65 y.o.   MRN: [id number]

## 2020-10-06 NOTE — Evaluation (Signed)
Clinical/Bedside Swallow Evaluation Patient Details  Name: Richard Oconnell MRN: [id number] Date of Birth: [birth year]  Today's Date: 10/06/2020 Time: SLP Start Time (ACUTE ONLY): 2 SLP Stop Time (ACUTE ONLY): 1229 SLP Time Calculation (min) (ACUTE ONLY): 59.37 min  Past Medical History:  Past Medical History:  Diagnosis Date  . Cancer [facility])    Bladder Cancer  . Erectile dysfunction associated with type 2 diabetes mellitus ([place]) 05/26/2016  . Gross hematuria 11/16/2016  . Hypertension 06/11/2015  . Type 2 diabetes mellitus with diabetic polyneuropathy, without long-term current use of insulin ([place]) 06/11/2015   Past Surgical History:  Past Surgical History:  Procedure Laterality Date  . boil lanceted     hand  . COLONOSCOPY WITH PROPOFOL N/A 07/03/2020   Procedure: COLONOSCOPY WITH PROPOFOL;  Surgeon: Jonathon Bellows, MD;  Location: [facility] ENDOSCOPY;  Service: Gastroenterology;  Laterality: N/A;  . CYSTOSCOPY W/ RETROGRADES Bilateral 12/06/2016   Procedure: CYSTOSCOPY WITH RETROGRADE PYELOGRAM;  Surgeon: Hollice Espy, MD;  Location: [facility] ORS;  Service: Urology;  Laterality: Bilateral;  . CYSTOSCOPY W/ RETROGRADES Bilateral 06/27/2017   Procedure: CYSTOSCOPY WITH RETROGRADE PYELOGRAM;  Surgeon: Hollice Espy, MD;  Location: [facility] ORS;  Service: Urology;  Laterality: Bilateral;  . CYSTOSCOPY WITH BIOPSY N/A 06/27/2017   Procedure: CYSTOSCOPY WITH Bladder BIOPSY;  Surgeon: Hollice Espy, MD;  Location: [facility] ORS;  Service: Urology;  Laterality: N/A;  . CYSTOSCOPY WITH BIOPSY N/A 10/24/2017   Procedure: CYSTOSCOPY WITH Bladder BIOPSY;  Surgeon: Hollice Espy, MD;  Location: [facility] ORS;  Service: Urology;  Laterality: N/A;  . ingrown  Bilateral    ingrown toenail  . IR BILIARY DRAIN PLACEMENT WITH CHOLANGIOGRAM  07/10/2020  . IR CHOLANGIOGRAM EXISTING TUBE  08/07/2020  . IR RADIOLOGIST EVAL & MGMT  08/07/2020  . TRANSURETHRAL RESECTION OF BLADDER TUMOR N/A 12/06/2016   Procedure: TRANSURETHRAL  RESECTION OF BLADDER TUMOR (TURBT) (2-5cm) CLOT EVACUATION;  Surgeon: Hollice Espy, MD;  Location: [facility] ORS;  Service: Urology;  Laterality: N/A;  . TRANSURETHRAL RESECTION OF BLADDER TUMOR N/A 06/27/2017   Procedure: TRANSURETHRAL RESECTION OF BLADDER TUMOR (TURBT);  Surgeon: Hollice Espy, MD;  Location: [facility] ORS;  Service: Urology;  Laterality: N/A;  . TRANSURETHRAL RESECTION OF BLADDER TUMOR WITH MITOMYCIN-C N/A 01/18/2017   Procedure: TRANSURETHRAL RESECTION OF BLADDER TUMOR WITH MITOMYCIN-C-(SMALL);  Surgeon: Hollice Espy, MD;  Location: [facility] ORS;  Service: Urology;  Laterality: N/A;   HPI:  Per admitting H&P "65 year old man past medical history of hypertension, diabetes, and bladder cancer who was hospitalized with acute cholecystitis-several months ago 2/24  He was temporized with a percutaneous cholecystostomy tube.          Follow up  cholangiogram via the tube demonstrates that the cystic duct is still obstructed  He was offered him a robot-assisted laparoscopic cholecystectomy, but  deferred this procedure   On 4/25 recently had cholecystectomy along with significant lysis of adhesions on 4/25 here at [facility]        He presents today with somnolence and confusion.    Family reports that patient is disoriented the day before yesterday, after which his skin has begun to appear increasingly yellow.       +discomfort in the right upper quadrant of his abdomen, liver failure, renal failure, severe acidosis  Patient started on pressors, admitted for septic shock "   Assessment / Plan / Recommendation Clinical Impression  Pt presents with moderate dysphagia at bedside with inconsistent s/s of aspiration. Upon entering the room, Pt presented with a wet, breathy  and weak vocal quality indicating poor airway protection. Pt currently has NGT with feedings and is s/p intubation. He was successfully extubated 09/30/20. Oral mech exam revealed overall oral motor weakness secondary to deconditioning. Pt  tolerated ice chips, 2 tsp water, 2 bites of aspplesauce and 1 tsp of nectar thick liquid without immedicate coughing. Noted wet vocal quality x1 and multiple swallows per bolus likely indicating pharyngeal residue. Further instrumental testing is needed prior to determining the safety of a PO diet. Rec MBSS 4/24 with NGT to be pulled prior to assessment as NGT impeding the pharyngeal wall can adversely affect swallowing perfomance. Rec Pt. be given ice chips today and prior to [facility] tomorrow when alert and fuly upright. It should be noted that vocal strength and speech was stronger at the end of the session today after being given PO's. Pt and family agreed. SLP Visit Diagnosis: Dysphagia, oropharyngeal phase (R13.12)    Aspiration Risk  Moderate aspiration risk    Diet Recommendation NPO;Alternative means - temporary;Ice chips PRN after oral care   Medication Administration: Via alternative means    Other  Recommendations Oral Care Recommendations: Oral care prior to ice chip/H20 Other Recommendations: Have oral suction available   Follow up Recommendations        Frequency and Duration min 3x week  2 weeks       Prognosis Prognosis for Safe Diet Advancement: Good      Swallow Study   General Date of Onset: 09/25/20 HPI: Per admitting H&P "65 year old man past medical history of hypertension, diabetes, and bladder cancer who was hospitalized with acute cholecystitis-several months ago 2/24  He was temporized with a percutaneous cholecystostomy tube.          Follow up  cholangiogram via the tube demonstrates that the cystic duct is still obstructed  He was offered him a robot-assisted laparoscopic cholecystectomy, but  deferred this procedure   On 4/25 recently had cholecystectomy along with significant lysis of adhesions on 4/25 here at [facility].        He presents today with somnolence and confusion.    Family reports that patient is disoriented the day before yesterday, after which his  skin has begun to appear increasingly yellow.       +discomfort in the right upper quadrant of his abdomen, liver failure, renal failure, severe acidosis  Patient started on pressors, admitted for septic shock " Type of Study: Bedside Swallow Evaluation Previous Swallow Assessment: None Diet Prior to this Study: NG Tube;NPO Temperature Spikes Noted: No Respiratory Status: Room air History of Recent Intubation: Yes Length of Intubations (days): 4 days Date extubated: 09/30/20 Behavior/Cognition: Cooperative;Pleasant mood;Distractible Oral Cavity Assessment: Dry;Dried secretions Oral Care Completed by SLP: Yes Oral Cavity - Dentition: Poor condition;Adequate natural dentition Self-Feeding Abilities: Total assist Patient Positioning: Upright in bed Baseline Vocal Quality: Low vocal intensity;Hoarse;Breathy;Wet Volitional Cough: Weak Volitional Swallow: Unable to elicit    Oral/Motor/Sensory Function Overall Oral Motor/Sensory Function: Mild impairment Facial ROM: Within Functional Limits Facial Symmetry: Within Functional Limits Facial Strength: Within Functional Limits Facial Sensation: Within Functional Limits Lingual ROM: Reduced right;Reduced left Lingual Symmetry: Within Functional Limits Lingual Strength: Reduced Lingual Sensation: Within Functional Limits Mandible: Impaired   Ice Chips Ice chips: Within functional limits Presentation: Spoon   Thin Liquid Thin Liquid: Impaired Presentation: Spoon Oral Phase Impairments: Reduced labial seal;Reduced lingual movement/coordination Oral Phase Functional Implications: Prolonged oral transit Pharyngeal  Phase Impairments: Other (comments);Throat Clearing - Delayed    Nectar Thick Nectar Thick Liquid:  Impaired Presentation: Spoon Oral phase functional implications: Prolonged oral transit Pharyngeal Phase Impairments: Wet Vocal Quality   Honey Thick     Puree Puree: Impaired Presentation: Spoon Oral Phase Impairments: Reduced  lingual movement/coordination Oral Phase Functional Implications: Prolonged oral transit Pharyngeal Phase Impairments: Multiple swallows   Solid     Solid: Not tested      Lucila Maine 10/06/2020,12:29 PM

## 2020-10-06 NOTE — Consult Note (Addendum)
Cardiology Consultation:   [name] MRN: [id number]; DOB: 15-May-1956  Admit date: 09/25/2020 Date of Consult: 10/06/2020  PCP:  [name], [facility]  Cardiologist: [facility], Dr. Rockey Situ rounding Advanced Practice Provider:  No care team member to display Electrophysiologist:  None  [id number]  Patient Profile:   Richard Oconnell is a 65 y.o. male with a hx of HTN, DM2, bladder CA, previous admission with acute cholecystitis s/p cholecystotomy tube 2/24 with follow-up robotic assisted cholecystectomy 4/25, and who is being seen today for the evaluation of atrial fibrillation when high risk for [facility] and in the setting of septic shock and bacteremia with large abdominal abscess and multiorgan failure wcomplicated by severe metabolic acidosis and toxic metabolic encephalopathy with involvement of palliative care at the request of Olga Millers, NP.  History of Present Illness:   Mr. Fluegel is a 65 yo male with PMH as above and no previously known history of arrhythmia or heart dz.   He presented to [facility] with large abdominal abscess and severe septic shock with bacteremia and multiorgan failure, complicated by severe metabolic acidosis and toxic metabolic encephalopathy.  He has been evaluated by palliative care as recently as 5/16 with overall prognosis of patient ruled poor.  Per family, the patient became disoriented and then skin appeared jaundiced, leading him to present to [facility].   Cardiology consulted for possible PAF with [place] not recommended in the setting of high risk for bleeding with liver dysfunction and large abscess. Amiodarone was noted as reportedly discontinued in the setting of junctional rhythm.   Of note, unable to find atrial fibrillation on review of telemetry or EKGs this admission; however, on review of previous EKGs, was able to find evidence of atrial fibrillation from 07/09/2020, though on  review of prior to admission medications, he has not been maintained on anticoagulation since Afib EKG on February 2022.  Past Medical History:  Diagnosis Date  . Cancer [facility])    Bladder Cancer  . Erectile dysfunction associated with type 2 diabetes mellitus ([place]) 05/26/2016  . Gross hematuria 11/16/2016  . Hypertension 06/11/2015  . Type 2 diabetes mellitus with diabetic polyneuropathy, without long-term current use of insulin ([place]) 06/11/2015    Past Surgical History:  Procedure Laterality Date  . boil lanceted     hand  . COLONOSCOPY WITH PROPOFOL N/A 07/03/2020   Procedure: COLONOSCOPY WITH PROPOFOL;  Surgeon: Jonathon Bellows, MD;  Location: [facility] ENDOSCOPY;  Service: Gastroenterology;  Laterality: N/A;  . CYSTOSCOPY W/ RETROGRADES Bilateral 12/06/2016   Procedure: CYSTOSCOPY WITH RETROGRADE PYELOGRAM;  Surgeon: Hollice Espy, MD;  Location: [facility] ORS;  Service: Urology;  Laterality: Bilateral;  . CYSTOSCOPY W/ RETROGRADES Bilateral 06/27/2017   Procedure: CYSTOSCOPY WITH RETROGRADE PYELOGRAM;  Surgeon: Hollice Espy, MD;  Location: [facility] ORS;  Service: Urology;  Laterality: Bilateral;  . CYSTOSCOPY WITH BIOPSY N/A 06/27/2017   Procedure: CYSTOSCOPY WITH Bladder BIOPSY;  Surgeon: Hollice Espy, MD;  Location: [facility] ORS;  Service: Urology;  Laterality: N/A;  . CYSTOSCOPY WITH BIOPSY N/A 10/24/2017   Procedure: CYSTOSCOPY WITH Bladder BIOPSY;  Surgeon: Hollice Espy, MD;  Location: [facility] ORS;  Service: Urology;  Laterality: N/A;  . ingrown  Bilateral    ingrown toenail  . IR BILIARY DRAIN PLACEMENT WITH CHOLANGIOGRAM  07/10/2020  . IR CHOLANGIOGRAM EXISTING TUBE  08/07/2020  . IR RADIOLOGIST EVAL & MGMT  08/07/2020  . TRANSURETHRAL RESECTION OF BLADDER TUMOR N/A 12/06/2016   Procedure: TRANSURETHRAL RESECTION  OF BLADDER TUMOR (TURBT) (2-5cm) CLOT EVACUATION;  Surgeon: Hollice Espy, MD;  Location: [facility] ORS;  Service: Urology;  Laterality: N/A;  . TRANSURETHRAL RESECTION OF BLADDER TUMOR N/A  06/27/2017   Procedure: TRANSURETHRAL RESECTION OF BLADDER TUMOR (TURBT);  Surgeon: Hollice Espy, MD;  Location: [facility] ORS;  Service: Urology;  Laterality: N/A;  . TRANSURETHRAL RESECTION OF BLADDER TUMOR WITH MITOMYCIN-C N/A 01/18/2017   Procedure: TRANSURETHRAL RESECTION OF BLADDER TUMOR WITH MITOMYCIN-C-(SMALL);  Surgeon: Hollice Espy, MD;  Location: [facility] ORS;  Service: Urology;  Laterality: N/A;     Home Medications:  Prior to Admission medications   Medication Sig Start Date End Date Taking? Authorizing Provider  ibuprofen (ADVIL) 400 MG tablet Take 1 tablet (400 mg total) by mouth every 6 (six) hours as needed. 09/09/20  Yes Edison Simon R, PA-C  metoprolol tartrate (LOPRESSOR) 25 MG tablet Take 0.5 tablets (12.5 mg total) by mouth 2 (two) times daily. 09/19/20  Yes Karamalegos, Devonne Doughty, DO  oxyCODONE (OXY IR/ROXICODONE) 5 MG immediate release tablet Take 1 tablet (5 mg total) by mouth every 6 (six) hours as needed for severe pain or breakthrough pain. 09/09/20  Yes Edison Simon R, PA-C  pravastatin (PRAVACHOL) 20 MG tablet Take 1 tablet (20 mg total) by mouth daily. 05/30/20  Yes Karamalegos, Devonne Doughty, DO  zolpidem (AMBIEN) 5 MG tablet Take 1 tablet (5 mg total) by mouth at bedtime as needed for sleep. 08/19/20  Yes Karamalegos, Devonne Doughty, DO  acetaminophen (TYLENOL) 325 MG tablet Take 1 tablet (325 mg total) by mouth every 6 (six) hours as needed for mild pain, fever or headache (or Fever >/= 101). Patient not taking: No sig reported 07/12/20   Cherene Altes, MD  diphenhydramine-acetaminophen (TYLENOL PM) 25-500 MG TABS tablet Take 1 tablet by mouth at bedtime as needed. Patient not taking: No sig reported    [provider]  OZEMPIC, 0.25 OR 0.5 MG/DOSE, 2 MG/1.5ML SOPN Inject 1 mg into the skin once a week. Patient taking differently: Inject 1 mg into the skin every Thursday. 05/30/20   [name], DO    Inpatient Medications: Scheduled Meds: .  chlorhexidine gluconate (MEDLINE KIT)  15 mL Mouth Rinse BID  . Chlorhexidine Gluconate Cloth  6 each Topical Daily  . docusate  100 mg Per Tube BID  . feeding supplement (PROSource TF)  45 mL Per Tube Daily  . free water  200 mL Per Tube Q4H  . heparin injection (subcutaneous)  5,000 Units Subcutaneous Q8H  . insulin aspart  0-20 Units Subcutaneous Q4H  . insulin aspart  3 Units Subcutaneous Q4H  . insulin glargine  15 Units Subcutaneous QHS  . mouth rinse  15 mL Mouth Rinse 10 times per day  . multivitamin  1 tablet Per Tube QHS  . pantoprazole (PROTONIX) IV  40 mg Intravenous Q24H  . polyethylene glycol  17 g Per Tube Daily  . sodium chloride flush  3 mL Intravenous Q12H  . sodium chloride flush  5 mL Intracatheter Q8H  . sodium chloride flush  5 mL Intracatheter Q8H   Continuous Infusions: . sodium chloride Stopped (09/25/20 1647)  . sodium chloride    . sodium chloride Stopped (10/04/20 1112)  . ampicillin-sulbactam (UNASYN) IV Stopped (10/05/20 2249)  . feeding supplement (VITAL 1.5 CAL) 1,000 mL (10/05/20 2331)  . fluconazole (DIFLUCAN) IV 400 mg (10/05/20 2323)   PRN Meds: sodium chloride, acetaminophen, albuterol, docusate, fentaNYL (SUBLIMAZE) injection, melatonin, ondansetron (ZOFRAN) IV, polyethylene glycol, sodium chloride  flush  Allergies:    Allergies  Allergen Reactions  . Cyclobenzaprine Other (See Comments)    Not improving pain and makes him agressive.  . Lisinopril Cough    Social History:   Social History   Socioeconomic History  . Marital status: Divorced    Spouse name: Not on file  . Number of children: Not on file  . Years of education: Not on file  . Highest education level: Not on file  Occupational History  . Not on file  Tobacco Use  . Smoking status: Never Smoker  . Smokeless tobacco: Never Used  . Tobacco comment: once a while had one cigar >10 years ago  Vaping Use  . Vaping Use: Never used  Substance and Sexual Activity  . Alcohol  use: No    Alcohol/week: 0.0 standard drinks  . Drug use: No  . Sexual activity: Never  Other Topics Concern  . Not on file  Social History Narrative   Lives alone   Social Determinants of Health   Financial Resource Strain: Not on file  Food Insecurity: Not on file  Transportation Needs: Not on file  Physical Activity: Not on file  Stress: Not on file  Social Connections: Not on file  Intimate Partner Violence: Not on file    Family History:    Family History  Problem Relation Age of Onset  . Heart attack Mother   . Heart disease Mother   . Parkinson's disease Father   . Heart attack Maternal Grandfather   . Heart disease Maternal Grandfather   . Heart attack Maternal Aunt   . Heart attack Maternal Grandmother   . Prostate cancer Neg Hx   . Bladder Cancer Neg Hx   . Kidney cancer Neg Hx      ROS:  Please see the history of present illness.  Review of Systems  Unable to perform ROS: Acuity of condition    All other ROS reviewed and negative.     Physical Exam/Data:   Vitals:   10/06/20 0400 10/06/20 0440 10/06/20 0500 10/06/20 0600  BP: (!) 172/93  (!) 146/88 (!) 166/88  Pulse: (!) 53  68 60  Resp: (!) 25  (!) 29 (!) 30  Temp:      TempSrc:      SpO2: 93%  92% 92%  Weight:  108 kg    Height:        Intake/Output Summary (Last 24 hours) at 10/06/2020 0829 Last data filed at 10/06/2020 0600 Gross per 24 hour  Intake 1538.97 ml  Output 3205 ml  Net -1666.03 ml   Last 3 Weights 10/06/2020 10/05/2020 10/04/2020  Weight (lbs) 238 lb 1.6 oz 242 lb 4.6 oz 236 lb 1.8 oz  Weight (kg) 108 kg 109.9 kg 107.1 kg     Body mass index is 28.99 kg/m.  General:  Ill appearing male HEENT: normal Lymph: no adenopathy Neck: no JVD Endocrine:  No thryomegaly Vascular: No carotid bruits; FA pulses 2+ bilaterally without bruits  Cardiac:  normal S1, S2; RRR; no murmur  Lungs: clear by anterior auscultation only Abd: soft, nontender, no hepatomegaly  Ext: no  edema Musculoskeletal:  No deformities Skin: warm and dry  Neuro:  Not well oriented Psych:  Not well oriented  EKG:  The EKG was personally reviewed and demonstrates:  ST, 100bpm, repolarization abnormalities Telemetry:  Telemetry was personally reviewed and demonstrates: SR-SB and earlier junctional rhythm  Relevant CV Studies: Echo 09/27/20 . Left ventricular ejection fraction, by  estimation, is 60 to 65%. The  left ventricle has normal function. The left ventricle has no regional  wall motion abnormalities. Left ventricular diastolic parameters are  consistent with Grade II diastolic  dysfunction (pseudonormalization).  2. Right ventricular systolic function is normal. The right ventricular  size is normal. There is normal pulmonary artery systolic pressure. The  estimated right ventricular systolic pressure is 01.7 mmHg.  3. The mitral valve is normal in structure. No evidence of mitral valve  regurgitation. No evidence of mitral stenosis.  4. No valve vegetation.   Laboratory Data:  High Sensitivity Troponin:  No results for input(s): TROPONINIHS in the last 720 hours.   Chemistry Recent Labs  Lab 10/04/20 0627 10/05/20 0410 10/06/20 0419  NA 144 148* 148*  K 3.0* 3.2* 3.6  CL 106 111 113*  CO2 27 26 25   GLUCOSE 190* 209* 210*  BUN 73* 76* 73*  CREATININE 3.99* 3.40* 2.79*  CALCIUM 7.7* 7.9* 8.0*  GFRNONAA 16* 19* 24*  ANIONGAP 11 11 10     Recent Labs  Lab 10/03/20 0356 10/04/20 0627 10/05/20 0410 10/06/20 0419  PROT 5.6*  --   --  6.4*  ALBUMIN 1.9*  1.9* 1.9* 2.0* 2.3*  AST 39  --   --  67*  ALT 24  --   --  62*  ALKPHOS 168*  --   --  156*  BILITOT 1.2  --   --  1.2   Hematology Recent Labs  Lab 10/04/20 0627 10/05/20 0410 10/06/20 0419  WBC 14.6* 13.3* 15.1*  RBC 3.64* 3.86* 3.73*  HGB 10.0* 10.5* 10.1*  HCT 30.1* 32.0* 31.2*  MCV 82.7 82.9 83.6  MCH 27.5 27.2 27.1  MCHC 33.2 32.8 32.4  RDW 17.7* 18.2* 18.0*  PLT 273 316 302    BNPNo results for input(s): BNP, PROBNP in the last 168 hours.  DDimer No results for input(s): DDIMER in the last 168 hours.   Radiology/Studies:  MR BRAIN WO CONTRAST  Result Date: 10/04/2020 CLINICAL DATA:  Mental status change. Septic shock. Abdominal abscess. EXAM: MRI HEAD WITHOUT CONTRAST TECHNIQUE: Multiplanar, multiecho pulse sequences of the brain and surrounding structures were obtained without intravenous contrast. COMPARISON:  CT head 09/30/2020 FINDINGS: Brain: Mild cerebral atrophy. Negative for hydrocephalus. Mild white matter changes with scattered small white matter hyperintensities bilaterally. Negative for acute infarct.  Negative for hemorrhage mass or edema. Vascular: Normal arterial flow voids Skull and upper cervical spine: Negative Sinuses/Orbits: Paranasal sinuses clear.  Normal orbit Other: None IMPRESSION: No acute abnormality. Mild atrophy and mild white matter changes consistent with chronic microvascular ischemia. Electronically Signed   By: Franchot Gallo M.D.   On: 10/04/2020 12:33   CT IMAGE GUIDED DRAINAGE BY PERCUTANEOUS CATHETER  Result Date: 10/02/2020 CLINICAL DATA:  Previous cholecystectomy, with postop abscess, status post percutaneous drain catheter placement 09/25/2020. Follow-up CT demonstrated incomplete resolution of collection with catheter at the margin of the residual collection. Drain revision requested. EXAM: CT GUIDED REVISION OF PERITONEAL DRAIN CATHETER ANESTHESIA/SEDATION: 1% lidocaine subcutaneous PROCEDURE: The procedure, risks, benefits, and alternatives were explained to the patient's sibling. Questions regarding the procedure were encouraged and answered. The brother understands and consents to the procedure. Select axial scans through the upper abdomen were obtained. The drain catheter entry site and surrounding skin were prepped with chlorhexidinein a sterile fashion, and a sterile drape was applied covering the operative field. A sterile  gown and sterile gloves were used for the procedure. Local anesthesia was provided with  1% Lidocaine. The retention catheter was cut. The previously placed catheter was partially withdrawn, position confirmed under CT fluoroscopy. Catheter was then cut and exchanged over a stiff Amplatz wire for a new 14 French multi sidehole pigtail drain catheter, formed and position within the central aspect of the residual 7 patent collection. CT fluoroscopy confirms appropriate positioning. 20 mL of purulent material were aspirated. The catheter was secured externally with 0 Prolene suture and StatLock, and placed to gravity drain bag with good return of additional purulent contents. The patient tolerated the procedure well. COMPLICATIONS: None immediate FINDINGS: The previously placed drain catheter was at the inferior margin of the residual subhepatic complex gas and fluid collection. The catheter was exchanged for a 14 French device placed centrally within the residual collection with good return of purulent material. IMPRESSION: 1. Technically successful exchange and revision of right upper quadrant peritoneal abscess drain catheter. Electronically Signed   By: Lucrezia Europe M.D.   On: 10/02/2020 11:57     Assessment and Plan:   Concern for PAF in the setting of septic shock --Currently NSR with report of earlier PAF, though unable to locate this earlier Afib on the telemetry at this time. Of note, an earlier 07/09/20 EKG (not from this admission) showed A. Fib on review of EMR. No PTA anticoagulation. Most recent echo as above with EF nl.   --Per critical care staff, after Afib seen this admission, pt started on IV amiodarone, later discontinued for junctional rhythm.  --Would avoid amiodarone in the setting of transaminitis and digoxin in the setting of CKD. --Replete electrolytes with K goal 4.0 and Mg goal 2.0.  --No recommendation for [place] due to no clear evidence of atrial fibrillation with patient AMS and  current comorbids, placing him at high risk for bleeding.  --Recommend treatment of underlying illness and continue to monitor on telemetry.  --Overall prognosis of pt is poor with palliative care consulted. If able to recovery from his illness, consider ambulatory monitoring with Zio XT x2 weeks after discharge to further evaluate.   For questions or updates, please contact [place] Please consult [email or web address] for contact info under    Signed, Arvil Chaco, PA-C  10/06/2020 8:29 AM

## 2020-10-06 NOTE — Progress Notes (Signed)
Chaplain Maggie made initial visitation to meet patient and his two siblings. Brief introductions were made and space made for conversation until doctor came to speak with them. Patient available for continued emotional and spiritual support as needed.

## 2020-10-06 NOTE — Progress Notes (Signed)
Richard Oconnell, [place] 10/06/20  Subjective:   Hospital day # 11  Pt remains critically ill at the moment.   Cr down to 2.7.  Good UOP at 3.2 liters. Family at bedside.    Renal: 05/22 0701 - 05/23 0700 In: 1699 [I.V.:5; NG/GT:1160; IV Piggyback:514] Out: 3625 [Urine:3275; Drains:50; JKDTO:671] Lab Results  Component Value Date   CREATININE 2.79 (H) 10/06/2020   CREATININE 3.40 (H) 10/05/2020   CREATININE 3.99 (H) 10/04/2020    following simple commands   Objective:  Vital signs in last 24 hours:  Temp:  [98.6 F (37 C)-99 F (37.2 C)] 98.8 F (37.1 C) (05/23 0800) Pulse Rate:  [53-79] 63 (05/23 0800) Resp:  [17-31] 17 (05/23 0800) BP: (133-179)/(73-131) 162/98 (05/23 0800) SpO2:  [92 %-98 %] 95 % (05/23 0800) Weight:  [245 kg] 108 kg (05/23 0440)  Weight change: -1.9 kg Filed Weights   10/04/20 0442 10/05/20 0423 10/06/20 0440  Weight: 107.1 kg 109.9 kg 108 kg    Intake/Output:    Intake/Output Summary (Last 24 hours) at 10/06/2020 1030 Last data filed at 10/06/2020 0600 Gross per 24 hour  Intake 1508.97 ml  Output 2955 ml  Net -1446.03 ml    Physical Exam: General:  No acute distress, laying in the bed  HEENT  NGT in place, hearing intact  Pulm/lungs  Coarse breath sounds , normal effort  CVS/Heart  regular rhythm, no rub or gallop  Abdomen:   Soft, nontender, BS present  Extremities:  No peripheral edema  Neurologic:  follows simple commands  Skin:  No acute rashes  Foley in place Left IJ temporary cath 09/27/20- ICU team  Basic Metabolic Panel:  Recent Labs  Lab 10/02/20 0305 10/03/20 0356 10/04/20 0627 10/05/20 0410 10/06/20 0419  NA 139 143 144 148* 148*  K 3.3* 3.4* 3.0* 3.2* 3.6  CL 100 105 106 111 113*  CO2 _0 GLUCOSE 197* 263* 190* 209* 210*  BUN 64* 67* 73* 76* 73*  CREATININE 4.40* 4.55* 3.99* 3.40* 2.79*  CALCIUM 7.3* 7.5* 7.7* 7.9* 8.0*  MG 2.2 2.2 2.3 2.0 2.1  PHOS 3.8 4.7* 4.3 4.4  3.8     CBC: Recent Labs  Lab 10/02/20 0305 10/03/20 0356 10/04/20 0627 10/05/20 0410 10/06/20 0419  WBC 21.6* 14.7* 14.6* 13.3* 15.1*  NEUTROABS  --   --   --  9.8* 11.8*  HGB 12.3* 11.5* 10.0* 10.5* 10.1*  HCT 36.6* 34.1* 30.1* 32.0* 31.2*  MCV 79.9* 80.4 82.7 82.9 83.6  PLT 379 316 273 316 302      Lab Results  Component Value Date   HEPBSAG NON REACTIVE 09/26/2020   HEPBSAB NON REACTIVE 09/26/2020   HEPBIGM NON REACTIVE 09/26/2020      Microbiology:  Recent Results (from the past 240 hour(s))  Urine culture     Status: None   Collection Time: 09/26/20  1:36 PM   Specimen: Urine, Random  Result Value Ref Range Status   Specimen Description   Final    URINE, RANDOM Performed at [facility], [address], [place], [place] [address]    Special Requests   Final    NONE Performed at [facility], [address], [place], [place] [address]    Culture   Final    NO GROWTH Performed at [facility], [address], [place], [place] [address]    Report Status 09/28/2020 FINAL  Final  Culture, blood (single)  w Reflex to ID Panel     Status: None   Collection Time: 09/30/20  3:35 PM   Specimen: BLOOD  Result Value Ref Range Status   Specimen Description BLOOD BLOOD RIGHT HAND  Final   Special Requests   Final    BOTTLES DRAWN AEROBIC AND ANAEROBIC Blood Culture adequate volume   Culture   Final    NO GROWTH 5 DAYS Performed at [facility], [address], [place], [place] [address]    Report Status 10/05/2020 FINAL  Final    Coagulation Studies: Recent Labs    10/04/20 0627  LABPROT 14.7  INR 1.2    Urinalysis: No results for input(s): COLORURINE, LABSPEC, PHURINE, GLUCOSEU, HGBUR, BILIRUBINUR, KETONESUR, PROTEINUR, UROBILINOGEN, NITRITE, LEUKOCYTESUR in the last 72 hours.  Invalid input(s): APPERANCEUR    Imaging: MR BRAIN WO CONTRAST  Result Date: 10/04/2020 CLINICAL DATA:  Mental status change. Septic  shock. Abdominal abscess. EXAM: MRI HEAD WITHOUT CONTRAST TECHNIQUE: Multiplanar, multiecho pulse sequences of the brain and surrounding structures were obtained without intravenous contrast. COMPARISON:  CT head 09/30/2020 FINDINGS: Brain: Mild cerebral atrophy. Negative for hydrocephalus. Mild white matter changes with scattered small white matter hyperintensities bilaterally. Negative for acute infarct.  Negative for hemorrhage mass or edema. Vascular: Normal arterial flow voids Skull and upper cervical spine: Negative Sinuses/Orbits: Paranasal sinuses clear.  Normal orbit Other: None IMPRESSION: No acute abnormality. Mild atrophy and mild white matter changes consistent with chronic microvascular ischemia. Electronically Signed   By: Franchot Gallo M.D.   On: 10/04/2020 12:33     Medications:   . sodium chloride Stopped (09/25/20 1647)  . sodium chloride    . sodium chloride Stopped (10/04/20 1112)  . ampicillin-sulbactam (UNASYN) IV Stopped (10/05/20 2249)  . feeding supplement (VITAL 1.5 CAL) 1,000 mL (10/05/20 2331)  . fluconazole (DIFLUCAN) IV 400 mg (10/05/20 2323)   . chlorhexidine gluconate (MEDLINE KIT)  15 mL Mouth Rinse BID  . Chlorhexidine Gluconate Cloth  6 each Topical Daily  . docusate  100 mg Per Tube BID  . feeding supplement (PROSource TF)  45 mL Per Tube Daily  . free water  200 mL Per Tube Q4H  . heparin injection (subcutaneous)  5,000 Units Subcutaneous Q8H  . insulin aspart  0-20 Units Subcutaneous Q4H  . insulin aspart  3 Units Subcutaneous Q4H  . insulin glargine  15 Units Subcutaneous QHS  . mouth rinse  15 mL Mouth Rinse 10 times per day  . multivitamin  1 tablet Per Tube QHS  . pantoprazole (PROTONIX) IV  40 mg Intravenous Q24H  . polyethylene glycol  17 g Per Tube Daily  . sodium chloride flush  3 mL Intravenous Q12H  . sodium chloride flush  5 mL Intracatheter Q8H  . sodium chloride flush  5 mL Intracatheter Q8H   sodium chloride, acetaminophen,  albuterol, docusate, fentaNYL (SUBLIMAZE) injection, melatonin, ondansetron (ZOFRAN) IV, polyethylene glycol, sodium chloride flush  Assessment/ Plan:  65 y.o. male with hypertension, diabetes mellitus type II, bladder cancer, admitted on 09/25/2020 for Septic shock (HCC) [A41.9, R65.21] Altered mental status, unspecified altered mental status type [R41.82] Postprocedural intraabdominal abscess [T81.43XA] Sepsis with acute renal failure and septic shock, due to unspecified organism, unspecified acute renal failure type ([place]) [A41.9, R65.21, N17.9]   # AKI  baseline creatinine of 1.1, GFR >60 on 08/19/20.  Had dialysis 5/16 and 5/18.   Lab Results  Component Value Date   CREATININE 2.79 (H) 10/06/2020   CREATININE 3.40 (H)  10/05/2020   CREATININE 3.99 (H) 10/04/2020   05/22 0701 - 05/23 0700 In: 6256 [I.V.:5; NG/GT:1160; IV Piggyback:514] Out: 3893 [Urine:3275; Drains:50; Stool:300]  - Good UOP noted, Cr trending down, avoid nephrotoxins.  Monitor renal function daily.     # Sepsis, Gall bladder Abscess, S/p robotic assisted Lap Chole 09/08/2020 S/p CT guided GB abscess drain placement by VIR, on 5/12 Blood culture positive for Bacteroides and enterococcus on 5/12 - Continue unasyn and fluconazole.    # hyperphosphatemia -much improved, phos down to 3.8 now.  Lab Results  Component Value Date   CALCIUM 8.0 (L) 10/06/2020   PHOS 3.8 10/06/2020     # Acute resp failure intubated 09/27/20-09/30/2020 Now extubated and breathing comfortably.    LOS: [place] 5/23/202210:30 AM  [place], [place]  Note: This note was prepared with Dragon dictation. Any transcription errors are unintentional

## 2020-10-06 NOTE — Progress Notes (Addendum)
Daily Progress Note   Patient Name: Richard Oconnell       Date: 10/06/2020 DOB: Aug 16, 1955  Age: 65 y.o. MRN#: [id number] Attending Physician: Fritzi Mandes, MD Primary Care Physician: [name], DO Admit Date: 09/25/2020  Reason for Consultation/Follow-up: Establishing goals of care  Subjective: Patient is resting in bed. He remains extubated with NGT in place for tube feeds. He has a weak voice and very coarse breathing. He tries to clear his throat, and attempted to encourage him to cough but he would not. He is alert but is confused and unable to participate in a [place] conversation.  Attempted to orient him and introduce myself. Attempted unsuccessfully to show him how to use the younker suction device. He states he is grateful for the care he has received. He states he is grateful his sister and brother have been here, and are making decisions on his behalf.  He states he would like to continue current  care and feels "I have at least another 8 years in me". Sister and brother in to bedside. They state they have been working with him to try to get him to cough up secretions as they understand this can lead to PNA and/or need for re-intubation. Continue full code/full scope. Will have [place] with patient should be become oriented enough to participate.     Length of Stay: 11  Current Medications: Scheduled Meds:  . chlorhexidine gluconate (MEDLINE KIT)  15 mL Mouth Rinse BID  . Chlorhexidine Gluconate Cloth  6 each Topical Daily  . docusate  100 mg Per Tube BID  . feeding supplement (PROSource TF)  45 mL Per Tube Daily  . free water  200 mL Per Tube Q4H  . heparin injection (subcutaneous)  5,000 Units Subcutaneous Q8H  . insulin aspart  0-20 Units Subcutaneous Q4H  . insulin  aspart  3 Units Subcutaneous Q4H  . insulin glargine  15 Units Subcutaneous QHS  . mouth rinse  15 mL Mouth Rinse 10 times per day  . multivitamin  1 tablet Per Tube QHS  . pantoprazole (PROTONIX) IV  40 mg Intravenous Q24H  . polyethylene glycol  17 g Per Tube Daily  . sodium chloride flush  3 mL Intravenous Q12H  . sodium chloride flush  5 mL Intracatheter Q8H  . sodium chloride  flush  5 mL Intracatheter Q8H    Continuous Infusions: . sodium chloride Stopped (09/25/20 1647)  . sodium chloride    . sodium chloride Stopped (10/04/20 1112)  . ampicillin-sulbactam (UNASYN) IV    . feeding supplement (VITAL 1.5 CAL) 1,000 mL (10/05/20 2331)  . fluconazole (DIFLUCAN) IV      PRN Meds: sodium chloride, acetaminophen, albuterol, docusate, fentaNYL (SUBLIMAZE) injection, melatonin, ondansetron (ZOFRAN) IV, polyethylene glycol, sodium chloride flush  Physical Exam Pulmonary:     Comments: Coarse breathing at bedside. Very moist throat clearing.  Neurological:     Mental Status: He is alert.             Vital Signs: BP (!) 149/110   Pulse 75   Temp 98.8 F (37.1 C) (Oral)   Resp (!) 22   Ht 6' 3.98" (1.93 m)   Wt 108 kg   SpO2 92%   BMI 28.99 kg/m  SpO2: SpO2: 92 % O2 Device: O2 Device: Nasal Cannula O2 Flow Rate: O2 Flow Rate (L/min): 2 L/min  Intake/output summary:   Intake/Output Summary (Last 24 hours) at 10/06/2020 1234 Last data filed at 10/06/2020 0600 Gross per 24 hour  Intake 1508.97 ml  Output 2955 ml  Net -1446.03 ml   LBM: Last BM Date: 10/06/20 Baseline Weight: Weight: 113 kg Most recent weight: Weight: 108 kg     Flowsheet Rows   Flowsheet Row Most Recent Value  Intake Tab   Referral Department Critical care  Unit at Time of Referral ICU  Palliative Care Primary Diagnosis Other (Comment)  Date Notified 09/29/20  Palliative Care Type New Palliative care  Reason for referral Clarify Goals of Care  Date of Admission 09/25/20  Date first seen by  Palliative Care 09/29/20  # of days Palliative referral response time 0 Day(s)  # of days IP prior to Palliative referral 4  Clinical Assessment   Psychosocial & Spiritual Assessment   Palliative Care Outcomes       Patient Active Problem List   Diagnosis Date Noted  . Acute respiratory failure ([place]) 09/28/2020  . Encounter for nasogastric (NG) tube placement   . Altered mental status   . Abscess   . Acute kidney failure, unspecified ([place])   . Sepsis with acute renal failure and septic shock ([place]) 09/25/2020  . S/P laparoscopic cholecystectomy 09/08/2020  . Metabolic syndrome 11/57/2620  . Class 1 obesity due to excess calories with serious comorbidity and body mass index (BMI) of 33.0 to 33.9 in adult 07/06/2019  . Malignant neoplasm of urinary bladder ([place]) 12/06/2018  . Overweight (BMI 25.0-29.9) 07/04/2018  . Hyperlipidemia associated with type 2 diabetes mellitus ([place]) 11/30/2016  . 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 Gross hematuria 11/16/2016  . Erectile dysfunction associated with type 2 diabetes mellitus ([place]) 05/26/2016  . Diabetes mellitus with coincident hypertension ([employer]) 06/11/2015  . Type 2 diabetes mellitus with diabetic polyneuropathy, without long-term current use of insulin ([place]) 06/11/2015    Palliative Care Assessment & Plan   Recommendations/Plan:  Continuing full code/full scope.    Code Status:    Code Status Orders  (From admission, onward)         Start     Ordered   09/25/20 1738  Full code  Continuous        09/25/20 1739        Code Status History    Date Active Date Inactive Code Status Order ID Comments User Context   09/08/2020 1930 09/09/2020 1917 Full  Code [id number]  [name], MD Inpatient   07/09/2020 1819 07/12/2020 2215 Full Code [id number]  CoxBriant Cedar, DO ED   Advance Care Planning Activity       Prognosis:  Poor    Care plan was discussed with CCM and RN  Thank you for allowing the Palliative Medicine Team to  assist in the care of this patient.   Total Time 25 min Prolonged Time Billed  no       Greater than 50%  of this time was spent counseling and coordinating care related to the above assessment and plan.  Asencion Gowda, NP  Please contact Palliative Medicine Team phone at [phone] for questions and concerns.

## 2020-10-06 NOTE — Progress Notes (Addendum)
[facility] Day(s): 11.   Interval History:  Patient seen and examined No acute events or new complaints overnight.  Patient more alert; involved in conversation; difficult to understand Denied any complaints of abdominal pain  No longer in atrial fibrillation; rate controlled Leukocytosis with a slight bump today; 15.1K (from 13.3K yesterday) Renal function making slow improvements; sCr - 2.79; UO - 3.2L Last dialysis on May 18th; nephrology following Mild hypernatremia to 148 and hyperchloremia to 113 Drain exchanged with IR (05/19); 50 ccs out in last 24 hours; seropurulent Cx from drain placement growing enterococcus faecalis; switched to Unasyn (05/19) Started on tube feedings 05/16;tolerating  Vital signs in last 24 hours: [min-max] current  Temp:  [98.2 F (36.8 C)-99 F (37.2 C)] 98.6 F (37 C) (05/23 0200) Pulse Rate:  [53-79] 60 (05/23 0600) Resp:  [19-31] 30 (05/23 0600) BP: (133-179)/(73-131) 166/88 (05/23 0600) SpO2:  [92 %-98 %] 92 % (05/23 0600) Weight:  [893 kg] 108 kg (05/23 0440)     Height: 6' 3.98" (193 cm) Weight: 108 kg BMI (Calculated): 28.99   Intake/Output last 2 shifts:  05/22 0701 - 05/23 0700 In: 1699 [I.V.:5; NG/GT:1160; IV Piggyback:514] Out: 3625 [Urine:3275; Drains:50; Stool:300]   Physical Exam:  Constitutional: opens eyes to voice; more alert, still somewhat tangential and disoriented  HEENT: NGT in place, poor dentition  Respiratory: breathing non-labored at rest  Cardiovascular: regular rhythm, rate controlled   Gastrointestinal:Soft, non-tender, and non-distended, no rebound/guarding.percutaneous drain in the RUQ with purulent output, now to gravity. Rectal tube in place  Genitourinary: Foley in place Musculoskeletal: No peripheral edema appreciated Integumentary:warm, dry  Labs:  CBC Latest Ref Rng & Units 10/06/2020 10/05/2020 10/04/2020  WBC 4.0 - 10.5 K/uL 15.1(H) 13.3(H)  14.6(H)  Hemoglobin 13.0 - 17.0 g/dL 10.1(L) 10.5(L) 10.0(L)  Hematocrit 39.0 - 52.0 % 31.2(L) 32.0(L) 30.1(L)  Platelets 150 - 400 K/uL 302 316 273   CMP Latest Ref Rng & Units 10/06/2020 10/05/2020 10/04/2020  Glucose 70 - 99 mg/dL 210(H) 209(H) 190(H)  BUN 8 - 23 mg/dL 73(H) 76(H) 73(H)  Creatinine 0.61 - 1.24 mg/dL 2.79(H) 3.40(H) 3.99(H)  Sodium 135 - 145 mmol/L 148(H) 148(H) 144  Potassium 3.5 - 5.1 mmol/L 3.6 3.2(L) 3.0(L)  Chloride 98 - 111 mmol/L 113(H) 111 106  CO2 22 - 32 mmol/L 25 26 27   Calcium 8.9 - 10.3 mg/dL 8.0(L) 7.9(L) 7.7(L)  Total Protein 6.5 - 8.1 g/dL 6.4(L) - -  Total Bilirubin 0.3 - 1.2 mg/dL 1.2 - -  Alkaline Phos 38 - 126 U/L 156(H) - -  AST 15 - 41 U/L 67(H) - -  ALT 0 - 44 U/L 62(H) - -     Imaging studies: No new pertinent imaging studies   Assessment/Plan:  65 y.o. male with severe sepsis, AKI, and encephalopathy secondary to gallbladder fossa abscess26days s/p robotic assisted laparoscopic cholecystectomy for severe cholecystitis   - Maintain percutaneous drain; flush per IR recommendations (at least 5 ccs NS daily); monitor and record output             - Okay to continue enteric feedings via NGT; continue evaluate for ability to tolerate PO -Continue IV Abx (Unasyn); Cx withenterococcus faecalis; sensitivities reviewed -Monitor leukocytosis;slightly worse today; afebrile - Monitor renal function;imrpoved; nephrology on board   - Engage therapies  -Further management per primary service; we will follow; no significant change in management from surgical perspective   All of the above findings and recommendations were  discussed with the patient, and the medical team, and all of patient's questions were answered to his expressed satisfaction.  -- Edison Simon, PA-C [facility] 10/06/2020, 7:29 AM [phone] M-F: 7am - 4pm  I saw and evaluated the patient.  I agree with  the above documentation, exam, and plan, which I have edited where appropriate. [name]  11:46 AM

## 2020-10-06 NOTE — Progress Notes (Signed)
Pharmacy Antibiotic Note  Richard Oconnell is a 65 y.o. male admitted on 09/25/2020. Pharmacy has been consulted for ampicillin/sulbactam.  Patient with cholecystitis in February s/p perc drain placement then lap chole on 4/25. Surgical drain removed 5/3 and presents with sepsis.  IR placed drain 5/12 and repositioned 5/19 with increased output.  Blood culture and drain culture with E. Faecalis and bacteroides. Patient previously on piperacillin/tazobactam  Today, 10/06/2020 Day #11 antibiotics, Day #8 fluconazole  5/17 repeat Blood cx are NGTD, cleared E. Faecalis and bacteroides  Renal: IHD as needed (not needed since 5/18), SCr improving,  Having UOP  AST and ALT mild elevated per labs this am  WBC trending down until this am, 13.3 to 15.1  afeb  Plan:  For improved renal function, adjust ampicillin/sulbactam to 3gm IV q6h based on current renal function.   Follow renal function closely,  Getting IHD PRN  Continue fluconazole - check LFTs. Reduce dose to 200mg  IV q24h, follow-up LOT.   Height: 6' 3.98" (193 cm) Weight: 108 kg (238 lb 1.6 oz) IBW/kg (Calculated) : 86.76  Temp (24hrs), Avg:98.8 F (37.1 C), Min:98.6 F (37 C), Max:99 F (37.2 C)  Recent Labs  Lab 10/02/20 0305 10/03/20 0356 10/04/20 0627 10/05/20 0410 10/06/20 0419  WBC 21.6* 14.7* 14.6* 13.3* 15.1*  CREATININE 4.40* 4.55* 3.99* 3.40* 2.79*    Estimated Creatinine Clearance: 35.6 mL/min (A) (by C-G formula based on SCr of 2.79 mg/dL (H)).    Allergies  Allergen Reactions  . Cyclobenzaprine Other (See Comments)    Not improving pain and makes him agressive.  . Lisinopril Cough    Antimicrobials this admission: Zosyn 5/12 >> 5/19 Amp/sulb 5/19 >>   Microbiology results: 5/12 BCID/Bcx: E faecalis and B frag 5/12 abscess culture: E.faecalis, B frag 5/12 MRSA PCR: negative  Thank you for allowing pharmacy to be a part of this patient's care.  Doreene Eland, PharmD, BCPS.   Work Cell:  [phone] 10/06/2020 12:31 PM

## 2020-10-06 NOTE — Progress Notes (Signed)
    [facility] for Infectious Disease   Reason for visit: Follow up on intra abdominal abscess  Interval History: reposititoning of drain done 5/19.  Remains extubated.  Sister and brother at bedside.  Day 12 total antibiotics  Physical Exam: Constitutional:  Vitals:   10/06/20 1600 10/06/20 1700  BP: (!) 162/97 (!) 162/95  Pulse: 60 71  Resp: (!) 21 (!) 27  Temp: 98.9 F (37.2 C)   SpO2: 94% 93%   patient appears in NAD, alert but difficult to express himself, tearful at times HENT: + NG tube Respiratory: Normal respiratory effort Cardiovascular: RRR GI: soft, nt, nd; + GB drain  Review of Systems: Unable to be assessed due to patient factors  Lab Results  Component Value Date   WBC 15.1 (H) 10/06/2020   HGB 10.1 (L) 10/06/2020   HCT 31.2 (L) 10/06/2020   MCV 83.6 10/06/2020   PLT 302 10/06/2020    Lab Results  Component Value Date   CREATININE 2.79 (H) 10/06/2020   BUN 73 (H) 10/06/2020   NA 148 (H) 10/06/2020   K 3.6 10/06/2020   CL 113 (H) 10/06/2020   CO2 25 10/06/2020    Lab Results  Component Value Date   ALT 62 (H) 10/06/2020   AST 67 (H) 10/06/2020   ALKPHOS 156 (H) 10/06/2020     Microbiology: Recent Results (from the past 240 hour(s))  Culture, blood (single) w Reflex to ID Panel     Status: None   Collection Time: 09/30/20  3:35 PM   Specimen: BLOOD  Result Value Ref Range Status   Specimen Description BLOOD BLOOD RIGHT HAND  Final   Special Requests   Final    BOTTLES DRAWN AEROBIC AND ANAEROBIC Blood Culture adequate volume   Culture   Final    NO GROWTH 5 DAYS Performed at [facility], [address] [place], [place] [address]    Report Status 10/05/2020 FINAL  Final    Impression/Plan:  1. Intra abdominal abscess - Growth of Enterococcus and Bacteroides and no new findings. On ampicillin/sulbactam and fluconazole.   No changes in antibiotics, will continue to monitor  2.  Acute kidney injury - some improvement in  creat, down to 2.79 from a high of 4.55.  Will continue to monitor  Will monitor remotely and Dr. Delaine Lame back next week. Call with questions

## 2020-10-06 NOTE — Progress Notes (Signed)
PT Cancellation Note  Patient Details Name: Richard Oconnell MRN: [id number] DOB: 02/12/1956   Cancelled Treatment:    Reason Eval/Treat Not Completed: Medical issues which prohibited therapy   Reason Eval/Treat Not Completed: Medical issues which prohibited therapy. Pt started on new cardizem drip at 1426 on 10/06/20. Per therapy protocol, therapy to hold for 24 hours and then progress as tolerated. PT will re-attempt when pt is medically and actively able to participate in PT intervention    Chesley Noon 10/06/2020, 3:39 PM

## 2020-10-06 NOTE — Progress Notes (Signed)
OT Cancellation Note  Patient Details Name: Richard Oconnell MRN: [id number] DOB: Jun 29, 1955   Cancelled Treatment:    Reason Eval/Treat Not Completed: Medical issues which prohibited therapy. Pt started on new cardizem drip at 1426 on 10/06/20. Per therapy protocol, therapy to hold for 24 hours and then progress as tolerated. OT will re-attempt when pt is medically and actively able to participate in OT intervention.   Darleen Crocker, [place], OTR/L , [place] [phone]  10/06/20, 2:40 PM   10/06/2020, 2:38 PM

## 2020-10-06 NOTE — Progress Notes (Signed)
Patient alert and oriented to self and place, disoriented to time and situation. Patient has been in and out of sleep. Patient asked for fentanyl after being turned to clean his buttocks (See MAR). Patient refused a bath stated he wants to take a shower. Explained to patient that he is in the ICU and unable to take a shower. Offered the patient CHG wipes and he said yes to the CHG wipe down. Patient has cough and deep breathe to get some of the mucous in his chest up while I suctioned. Patient has had good urine output. Patients heart rate remains irregular switching between sinus NSR, SB, and junctional rhythms. Will continue patient care.

## 2020-10-07 DIAGNOSIS — I498 Other specified cardiac arrhythmias: Secondary | ICD-10-CM | POA: Diagnosis not present

## 2020-10-07 DIAGNOSIS — A4181 Sepsis due to Enterococcus: Secondary | ICD-10-CM | POA: Diagnosis not present

## 2020-10-07 DIAGNOSIS — T8143XA Infection following a procedure, organ and space surgical site, initial encounter: Secondary | ICD-10-CM | POA: Diagnosis not present

## 2020-10-07 DIAGNOSIS — N170 Acute kidney failure with tubular necrosis: Secondary | ICD-10-CM | POA: Diagnosis not present

## 2020-10-07 DIAGNOSIS — B9689 Other specified bacterial agents as the cause of diseases classified elsewhere: Secondary | ICD-10-CM | POA: Diagnosis not present

## 2020-10-07 DIAGNOSIS — R6520 Severe sepsis without septic shock: Secondary | ICD-10-CM | POA: Diagnosis not present

## 2020-10-07 DIAGNOSIS — I480 Paroxysmal atrial fibrillation: Secondary | ICD-10-CM

## 2020-10-07 DIAGNOSIS — A4189 Other specified sepsis: Secondary | ICD-10-CM | POA: Diagnosis not present

## 2020-10-07 DIAGNOSIS — A419 Sepsis, unspecified organism: Secondary | ICD-10-CM | POA: Diagnosis not present

## 2020-10-07 LAB — RENAL FUNCTION PANEL
Albumin: 2.3 g/dL — ABNORMAL LOW (ref 3.5–5.0)
Anion gap: 12 (ref 5–15)
BUN: 68 mg/dL — ABNORMAL HIGH (ref 8–23)
CO2: 24 mmol/L (ref 22–32)
Calcium: 8 mg/dL — ABNORMAL LOW (ref 8.9–10.3)
Chloride: 111 mmol/L (ref 98–111)
Creatinine, Ser: 2.6 mg/dL — ABNORMAL HIGH (ref 0.61–1.24)
GFR, Estimated: 27 mL/min — ABNORMAL LOW (ref >60)
Glucose, Bld: 170 mg/dL — ABNORMAL HIGH (ref 70–99)
Phosphorus: 4.1 mg/dL (ref 2.5–4.6)
Potassium: 3.8 mmol/L (ref 3.5–5.1)
Sodium: 147 mmol/L — ABNORMAL HIGH (ref 135–145)

## 2020-10-07 LAB — CBC
HCT: 32.2 % — ABNORMAL LOW (ref 39.0–52.0)
Hemoglobin: 10 g/dL — ABNORMAL LOW (ref 13.0–17.0)
MCH: 26.7 pg (ref 26.0–34.0)
MCHC: 31.1 g/dL (ref 30.0–36.0)
MCV: 85.9 fL (ref 80.0–100.0)
Platelets: 320 10*3/uL (ref 150–400)
RBC: 3.75 MIL/uL — ABNORMAL LOW (ref 4.22–5.81)
RDW: 18 % — ABNORMAL HIGH (ref 11.5–15.5)
WBC: 14.8 10*3/uL — ABNORMAL HIGH (ref 4.0–10.5)
nRBC: 0 % (ref 0.0–0.2)

## 2020-10-07 LAB — GLUCOSE, CAPILLARY
Glucose-Capillary: 165 mg/dL — ABNORMAL HIGH (ref 70–99)
Glucose-Capillary: 143 mg/dL — ABNORMAL HIGH (ref 70–99)
Glucose-Capillary: 175 mg/dL — ABNORMAL HIGH (ref 70–99)
Glucose-Capillary: 113 mg/dL — ABNORMAL HIGH (ref 70–99)
Glucose-Capillary: 209 mg/dL — ABNORMAL HIGH (ref 70–99)
Glucose-Capillary: 102 mg/dL — ABNORMAL HIGH (ref 70–99)

## 2020-10-07 LAB — HEPATIC FUNCTION PANEL
ALT: 64 U/L — ABNORMAL HIGH (ref 0–44)
AST: 64 U/L — ABNORMAL HIGH (ref 15–41)
Albumin: 2.3 g/dL — ABNORMAL LOW (ref 3.5–5.0)
Alkaline Phosphatase: 156 U/L — ABNORMAL HIGH (ref 38–126)
Bilirubin, Direct: 0.4 mg/dL — ABNORMAL HIGH (ref 0.0–0.2)
Indirect Bilirubin: 0.8 mg/dL (ref 0.3–0.9)
Total Bilirubin: 1.2 mg/dL (ref 0.3–1.2)
Total Protein: 6.8 g/dL (ref 6.5–8.1)

## 2020-10-07 LAB — MAGNESIUM: Magnesium: 2.1 mg/dL (ref 1.7–2.4)

## 2020-10-07 MED ORDER — NUTRISOURCE FIBER PO PACK
1.0000 | PACK | Freq: Two times a day (BID) | ORAL | Status: DC
  Filled 2020-10-07 (×2): qty 1

## 2020-10-07 MED ORDER — ADULT MULTIVITAMIN W/MINERALS CH
1.0000 | ORAL_TABLET | Freq: Every day | ORAL | Status: DC
  Administered 2020-10-08 – 2020-10-15 (×8): 1 via ORAL
  Filled 2020-10-07 (×8): qty 1

## 2020-10-07 MED ORDER — RENA-VITE PO TABS
1.0000 | ORAL_TABLET | Freq: Every day | ORAL | Status: DC
  Administered 2020-10-07 – 2020-10-14 (×8): 1 via ORAL
  Filled 2020-10-07 (×9): qty 1

## 2020-10-07 MED ORDER — NEPRO/CARBSTEADY PO LIQD
237.0000 mL | Freq: Three times a day (TID) | ORAL | Status: DC
  Administered 2020-10-08 – 2020-10-09 (×5): 237 mL via ORAL

## 2020-10-07 NOTE — Progress Notes (Addendum)
Speech Language Pathology Treatment: Dysphagia  Patient Details Name: Richard Oconnell MRN: [id number] DOB: 1955-09-18 Today's Date: 10/07/2020 Time: 7782-4235 SLP Time Calculation (min) (ACUTE ONLY): 60 min  Assessment / Plan / Recommendation Clinical Impression  Pt seen today for ongoing assessment of swallowing. MD contacted this SLP stating pt was much more awake/attentive and conversed w/ her in short sentences; Confusion still present w/ much Tangential speech. MD felt he was much more appropriate for assessment. Per chart review, pt has had severe metabolic encephalopathy due to septic shock and continues to present w/ mental status decline requiring verbal cues. Min oral confusion and Apraxic-like behavior noted during trials and during oral care and OM exam. NG tube was removed post trials of ice chips and trials of liquids.  Pt was positioned fully Upright, midline in bed w/ head forward. Oral care completed w/ good lingual strength during OM exam and reaction to swabs. Oral Apraxic-like movements noted; oral confusion w/ open-mouth posture and verbal/tactile cues given to close on utensil often during trials. Pt followed through w/ tasks given cues. Overall oropharyngeal phase swallow function is functional and best supported by a modified Dysphagia diet, but is impacted by decreased awareness during oral intake tasks/po intake d/t declined mental status. This can increase risk for choking/aspiration thus Pulmonary decline. Though present, pt's risk for aspiration appeared reduced when following general aspiration precautions, when directed/supported w/ feeding during oral intake, and when using a dysphagia diet. He requires mod verbal/tactile/visual cues for orientation to food and drink items present, follow through w/ taking of boluses, then w/ need to attend to po trials for swallowing/clearing. REDUCING DISTRACTIONS during po intake is necessary.  Pt consumed Single ice chips, thin  liquids via TSP, Nectar liquids via TSP/CUP/STRAW(pinched), and purees  w/ No overt clinical s/s of aspiration noted Except w/ thin liquids -- immediate Coughing following the swallow x3/3. No overt coughing, no decline in phonations, and no decline in respiratory status during/post trials, and O2 sats remained 95-96% w/ remaining trial consistencies. Oral phase was c/b decreased oral awareness for oral prep, increased oral phase time for bolus management, and oral clearing of the boluses given; he often sucked on his teeth w/ increased lingual sweeping movements to clear orally b/t bites; intake overall required Mod cues and cues for less talking w/ instruction to focus on po trials. No trials of solids foods given.   Discussed diet consistency of foods/liquids w/ initiation of a dysphagia diet; aspiration precautions; pills Crushed in puree; feeding support and supervision at meals by NSG to monitor for any s/s of aspiration. Precautions posted in room, chart. Recommend a Dysphagia level 1(PUREE) w/ Nectar liquids - pinched straw to limit large boluses; aspiration precautions; reduce Distractions during meals. Encouraged pt to attend to po intake. Pills Crushed in Puree for safer swallowing. Support at all meals d/t mental status decline. Recommend Dietician f/u. Pt may have Single ice chips s/p Oral Care PRN w/ aspiration precautions. NSG/MD agreed. ST services will f/u w/ ongoing toleration of diet; trials to upgrade and/or MBSS if needed when advancing the diet.    HPI HPI: Pt is a 65 year old man past medical history of hypertension, diabetes, and bladder cancer who was hospitalized with acute cholecystitis-several months ago 2/24  He was temporized with a percutaneous cholecystostomy tube.          Follow up  cholangiogram via the tube demonstrates that the cystic duct is still obstructed  He was offered him a robot-assisted laparoscopic cholecystectomy,  but  deferred this procedure   On 4/25 recently  had cholecystectomy along with significant lysis of adhesions on 4/25 here at [facility]. He presents today with somnolence and confusion. Family reports that patient is disoriented the day before yesterday, after which his skin has begun to appear increasingly yellow, +discomfort in the right upper quadrant of his abdomen, liver failure, renal failure, severe acidosis.    Pt is s/p cholecystectomy along with significant lysis of adhesions. He is admitted to ICU for large abd abscess in the gallbladder fossa, jaundice and septic shock, on pressors.  Of Note, pt was orally intubated 09/27/2020-09/30/2020; NG during same and removed 10/07/2020.      SLP Plan  Continue with current plan of care       Recommendations  Diet recommendations: Dysphagia 1 (puree);Nectar-thick liquid Liquids provided via: Teaspoon;Cup;Straw (Pinch straw for reducing bolus amount/frequency) Medication Administration: Crushed with puree Supervision: Staff to assist with self feeding;Full supervision/cueing for compensatory strategies Compensations: Minimize environmental distractions;Slow rate;Small sips/bites;Lingual sweep for clearance of pocketing;Multiple dry swallows after each bite/sip;Follow solids with liquid Postural Changes and/or Swallow Maneuvers: Seated upright 90 degrees;Upright 30-60 min after meal                General recommendations:  (Dietician f/u for support) Oral Care Recommendations: Oral care BID;Oral care before and after PO;Staff/trained caregiver to provide oral care Follow up Recommendations:  (TBD) SLP Visit Diagnosis: Dysphagia, oropharyngeal phase (R13.12) (mental status decline) Plan: Continue with current plan of care       GO                 [name], [place], [place] Pathologist Rehab Services [phone] [facility] 10/07/2020, 3:05 PM

## 2020-10-07 NOTE — TOC Progression Note (Signed)
Transition of Care [facility]) - Progression Note    Patient Details  Name: Richard Oconnell MRN: [id number] Date of Birth: May 31, 1955  Transition of Care [facility]) CM/SW [place], [place] Phone Number: 10/07/2020, 4:09 PM  Clinical Narrative:     CSW left voicemail from patient's sister Ms. Lovena Le. CSW left voicemail for patient's sister Luanna Salk [phone], to discuss SNF placement process. CSW requested Ms. Lovena Le ask for CSW if she came to visit the patient tomorrow.   Expected Discharge Plan: [place] Barriers to Discharge: Continued Medical Work up  Expected Discharge Plan and Services Expected Discharge Plan: [place] In-house Referral: Clinical Social Work   Post Acute Care Choice: [place] Living arrangements for the past 2 months: Single Family Home                                       Social Determinants of Health (SDOH) Interventions    Readmission Risk Interventions No flowsheet data found.

## 2020-10-07 NOTE — Progress Notes (Addendum)
[facility] Day(s): 12.   Interval History:  Patient seen and examined No acute events or new complaints overnight.  Patient more alert; involved; difficult to understand Denied any complaints of abdominal pain  Leukocytosis relatively stable vs slightly improved; now 14.8K (from 15.1K yesterday) Renal function making slow improvements; sCr - 2.60; UO - 2.4L Last dialysis on May 18th; nephrology following Drain exchanged with IR (05/19); 20 ccs out in last 24 hours; seropurulent Cx from drain placement growing enterococcus faecalis; switched to Unasyn (05/19) Started on tube feedings 05/16; tolerating: + bowel function  Seen by SLP; recommend NPO currently  Vital signs in last 24 hours: [min-max] current  Temp:  [98.9 F (37.2 C)-99.4 F (37.4 C)] 99.1 F (37.3 C) (05/24 0800) Pulse Rate:  [60-113] 80 (05/23 1805) Resp:  [21-34] 32 (05/23 1805) BP: (149-162)/(83-110) 159/83 (05/23 1800) SpO2:  [90 %-95 %] 94 % (05/23 1805) Weight:  [109.2 kg] 109.2 kg (05/24 0434)     Height: 6' 3.98" (193 cm) Weight: 109.2 kg BMI (Calculated): 29.32   Intake/Output last 2 shifts:  05/23 0701 - 05/24 0700 In: 2173.9 [I.V.:66.1; NG/GT:1807.9; IV Piggyback:300] Out: 2545 [Urine:2425; Drains:20; Stool:100]   Physical Exam:  Constitutional: opens eyes to voice; more alert, still somewhat tangential and disoriented  HEENT: NGT in place, poor dentition  Respiratory: breathing non-labored at rest  Cardiovascular: regular rhythm, rate controlled   Gastrointestinal: Soft, non-tender, and non-distended, no rebound/guarding. percutaneous drain in the RUQ with purulent output, now to gravity. Rectal tube in place  Genitourinary: Foley in place Musculoskeletal: No peripheral edema appreciated Integumentary: warm, dry  Labs:  CBC Latest Ref Rng & Units 10/07/2020 10/06/2020 10/05/2020  WBC 4.0 - 10.5 K/uL 14.8(H) 15.1(H) 13.3(H)  Hemoglobin 13.0 - 17.0  g/dL 10.0(L) 10.1(L) 10.5(L)  Hematocrit 39.0 - 52.0 % 32.2(L) 31.2(L) 32.0(L)  Platelets 150 - 400 K/uL 320 302 316   CMP Latest Ref Rng & Units 10/07/2020 10/06/2020 10/05/2020  Glucose 70 - 99 mg/dL 170(H) 210(H) 209(H)  BUN 8 - 23 mg/dL 68(H) 73(H) 76(H)  Creatinine 0.61 - 1.24 mg/dL 2.60(H) 2.79(H) 3.40(H)  Sodium 135 - 145 mmol/L 147(H) 148(H) 148(H)  Potassium 3.5 - 5.1 mmol/L 3.8 3.6 3.2(L)  Chloride 98 - 111 mmol/L 111 113(H) 111  CO2 22 - 32 mmol/L 24 25 26   Calcium 8.9 - 10.3 mg/dL 8.0(L) 8.0(L) 7.9(L)  Total Protein 6.5 - 8.1 g/dL 6.8 6.4(L) -  Total Bilirubin 0.3 - 1.2 mg/dL 1.2 1.2 -  Alkaline Phos 38 - 126 U/L 156(H) 156(H) -  AST 15 - 41 U/L 64(H) 67(H) -  ALT 0 - 44 U/L 64(H) 62(H) -     Imaging studies: No new pertinent imaging studies   Assessment/Plan:  65 y.o. male with severe sepsis, AKI, and encephalopathy secondary to gallbladder fossa abscess 27 days s/p initial robotic assisted laparoscopic cholecystectomy for severe cholecystitis   - Maintain percutaneous drain; flush per IR recommendations (at least 5 ccs NS daily); monitor and record output             - Okay to continue enteric feedings via NGT; continue evaluate for ability to tolerate PO; SLP following             - Continue IV Abx (Unasyn); Cx with enterococcus faecalis; sensitivities reviewed; ID on board  - Will continue to monitor for improvement, ? Role for repeat imaging in a few days pending clinical condition to ensure resolution of  abscess              - Monitor leukocytosis; improved slightly ; afebrile             - Monitor renal function; imrpoved; nephrology on board   - Engage therapies; following              - Further management per primary service   - No significant changes from surgical perspective, continue drain, monitor leukocytosis. We are not adding anything significant currently, we will follow peripherally for now  All of the above findings and recommendations were discussed  with the patient, and the medical team, and all of patient's questions were answered to his expressed satisfaction.  -- Edison Simon, PA-C [facility] 10/07/2020, 9:03 AM [phone] M-F: 7am - 4pm  With poor pulmonary mechanics and persistent leukocytosis, may need to consider the possibility of pneumonia versus persistent undrained intrabdominal fluid collection.  Anticipate need for SNF upon d/c from hospital, due to severe deconditioning.   I saw and evaluated the patient.  I agree with the above documentation, exam, and plan, which I have edited where appropriate. [name]  11:06 AM

## 2020-10-07 NOTE — Progress Notes (Addendum)
Pt daughter Vito Backers was called and notified of mom being moved to room 136.

## 2020-10-07 NOTE — Progress Notes (Signed)
[place], [place] 10/07/20  Subjective:   Hospital day # 12  No new creatinine today. Good urine output of 2.5 L. Patient appears confused today.   Renal: 05/23 0701 - 05/24 0700 In: 2173.9 [I.V.:66.1; NG/GT:1807.9; IV Piggyback:300] Out: 2545 [Urine:2425; Drains:20; Stool:100] Lab Results  Component Value Date   CREATININE 2.79 (H) 10/06/2020   CREATININE 3.40 (H) 10/05/2020   CREATININE 3.99 (H) 10/04/2020    following simple commands   Objective:  Vital signs in last 24 hours:  Temp:  [98.8 F (37.1 C)-99.4 F (37.4 C)] 99.4 F (37.4 C) (05/23 2000) Pulse Rate:  [60-113] 80 (05/23 1805) Resp:  [17-34] 32 (05/23 1805) BP: (149-162)/(83-110) 159/83 (05/23 1800) SpO2:  [90 %-95 %] 94 % (05/23 1805) Weight:  [109.2 kg] 109.2 kg (05/24 0434)  Weight change: 1.2 kg Filed Weights   10/05/20 0423 10/06/20 0440 10/07/20 0434  Weight: 109.9 kg 108 kg 109.2 kg    Intake/Output:    Intake/Output Summary (Last 24 hours) at 10/07/2020 0732 Last data filed at 10/07/2020 8502 Gross per 24 hour  Intake 2173.94 ml  Output 2545 ml  Net -371.06 ml    Physical Exam: General:  No acute distress, laying in the bed  HEENT  NGT in place, hearing intact  Pulm/lungs  Coarse breath sounds , normal effort  CVS/Heart  regular rhythm, no rub or gallop  Abdomen:   Soft, nontender, BS present  Extremities:  No peripheral edema  Neurologic:  Awake but confused  Skin:  No acute rashes  Foley in place Left IJ temporary cath 09/27/20- ICU team  Basic Metabolic Panel:  Recent Labs  Lab 10/02/20 0305 10/03/20 0356 10/04/20 0627 10/05/20 0410 10/06/20 0419 10/07/20 0331  NA 139 143 144 148* 148*  --   K 3.3* 3.4* 3.0* 3.2* 3.6  --   CL 100 105 106 111 113*  --   CO2 26 25 27 26 25   --   GLUCOSE 197* 263* 190* 209* 210*  --   BUN 64* 67* 73* 76* 73*  --   CREATININE 4.40* 4.55* 3.99* 3.40* 2.79*  --   CALCIUM 7.3* 7.5* 7.7* 7.9* 8.0*  --   MG  2.2 2.2 2.3 2.0 2.1 2.1  PHOS 3.8 4.7* 4.3 4.4 3.8  --      CBC: Recent Labs  Lab 10/02/20 0305 10/03/20 0356 10/04/20 0627 10/05/20 0410 10/06/20 0419  WBC 21.6* 14.7* 14.6* 13.3* 15.1*  NEUTROABS  --   --   --  9.8* 11.8*  HGB 12.3* 11.5* 10.0* 10.5* 10.1*  HCT 36.6* 34.1* 30.1* 32.0* 31.2*  MCV 79.9* 80.4 82.7 82.9 83.6  PLT 379 316 273 316 302      Lab Results  Component Value Date   HEPBSAG NON REACTIVE 09/26/2020   HEPBSAB NON REACTIVE 09/26/2020   HEPBIGM NON REACTIVE 09/26/2020      Microbiology:  Recent Results (from the past 240 hour(s))  Culture, blood (single) w Reflex to ID Panel     Status: None   Collection Time: 09/30/20  3:35 PM   Specimen: BLOOD  Result Value Ref Range Status   Specimen Description BLOOD BLOOD RIGHT HAND  Final   Special Requests   Final    BOTTLES DRAWN AEROBIC AND ANAEROBIC Blood Culture adequate volume   Culture   Final    NO GROWTH 5 DAYS Performed at [facility], [address] [place], [place] [address]    Report Status 10/05/2020  FINAL  Final    Coagulation Studies: No results for input(s): LABPROT, INR in the last 72 hours.  Urinalysis: No results for input(s): COLORURINE, LABSPEC, PHURINE, GLUCOSEU, HGBUR, BILIRUBINUR, KETONESUR, PROTEINUR, UROBILINOGEN, NITRITE, LEUKOCYTESUR in the last 72 hours.  Invalid input(s): APPERANCEUR    Imaging: No results found.   Medications:   . sodium chloride 10 mL/hr at 10/07/20 0500  . sodium chloride    . sodium chloride Stopped (10/04/20 1112)  . ampicillin-sulbactam (UNASYN) IV 3 g (10/07/20 0603)  . diltiazem (CARDIZEM) infusion Stopped (10/06/20 1533)  . feeding supplement (VITAL 1.5 CAL) 1,000 mL (10/07/20 0388)  . fluconazole (DIFLUCAN) IV Stopped (10/06/20 2228)   . chlorhexidine gluconate (MEDLINE KIT)  15 mL Mouth Rinse BID  . Chlorhexidine Gluconate Cloth  6 each Topical Daily  . docusate  100 mg Per Tube BID  . feeding supplement (PROSource  TF)  45 mL Per Tube Daily  . free water  200 mL Per Tube Q4H  . heparin injection (subcutaneous)  5,000 Units Subcutaneous Q8H  . insulin aspart  0-20 Units Subcutaneous Q4H  . insulin aspart  3 Units Subcutaneous Q4H  . insulin glargine  15 Units Subcutaneous QHS  . mouth rinse  15 mL Mouth Rinse 10 times per day  . multivitamin  1 tablet Per Tube QHS  . pantoprazole (PROTONIX) IV  40 mg Intravenous Q24H  . polyethylene glycol  17 g Per Tube Daily  . sodium chloride flush  3 mL Intravenous Q12H  . sodium chloride flush  5 mL Intracatheter Q8H  . sodium chloride flush  5 mL Intracatheter Q8H   sodium chloride, acetaminophen, albuterol, docusate, fentaNYL (SUBLIMAZE) injection, melatonin, metoprolol tartrate, ondansetron (ZOFRAN) IV, polyethylene glycol, sodium chloride flush  Assessment/ Plan:  65 y.o. male with hypertension, diabetes mellitus type II, bladder cancer, admitted on 09/25/2020 for Septic shock ([place]) [A41.9, R65.21] Altered mental status, unspecified altered mental status type [R41.82] Postprocedural intraabdominal abscess [T81.43XA] Sepsis with acute renal failure and septic shock, due to unspecified organism, unspecified acute renal failure type ([place]) [A41.9, R65.21, N17.9]   # AKI  baseline creatinine of 1.1, GFR >60 on 08/19/20.  Had dialysis 5/16 and 5/18.   Lab Results  Component Value Date   CREATININE 2.79 (H) 10/06/2020   CREATININE 3.40 (H) 10/05/2020   CREATININE 3.99 (H) 10/04/2020   05/23 0701 - 05/24 0700 In: 2173.9 [I.V.:66.1; NG/GT:1807.9; IV Piggyback:300] Out: 2545 [Urine:2425; Drains:20; Stool:100]  -Recommend rechecking renal function panel today.  Good urine output of 2.5 L over the preceding 24 hours.     # Sepsis, Gall bladder Abscess, S/p robotic assisted Lap Chole 09/08/2020 S/p CT guided GB abscess drain placement by VIR, on 5/12 Blood culture positive for Bacteroides and enterococcus on 5/12 - Continue unasyn and fluconazole.    #  hyperphosphatemia -Recheck serum phosphorus today. Lab Results  Component Value Date   CALCIUM 8.0 (L) 10/06/2020   PHOS 3.8 10/06/2020     # Acute resp failure intubated 09/27/20-09/30/2020 Patient continues to have stable respiratory pattern.  #Hypernatremia. Serum sodium 148 yesterday.  Currently receiving free water 200 cc every 4 hours.  Recheck serum sodium today.   LOS: 12 Aleigha Gilani 5/24/20227:32 [place], [place]  Note: This note was prepared with Dragon dictation. Any transcription errors are unintentional

## 2020-10-07 NOTE — Progress Notes (Signed)
Progress Note  Patient Name: Richard Oconnell Date of Encounter: 10/07/2020  [facility] Cardiologist: Lynden Ang  Subjective   Patient difficult to understand but indicates that he is not having chest pain, shortness of breath, or abdominal pain.  Inpatient Medications    Scheduled Meds: . chlorhexidine gluconate (MEDLINE KIT)  15 mL Mouth Rinse BID  . Chlorhexidine Gluconate Cloth  6 each Topical Daily  . docusate  100 mg Per Tube BID  . feeding supplement (PROSource TF)  45 mL Per Tube Daily  . free water  200 mL Per Tube Q4H  . heparin injection (subcutaneous)  5,000 Units Subcutaneous Q8H  . insulin aspart  0-20 Units Subcutaneous Q4H  . insulin aspart  3 Units Subcutaneous Q4H  . insulin glargine  15 Units Subcutaneous QHS  . mouth rinse  15 mL Mouth Rinse 10 times per day  . multivitamin  1 tablet Per Tube QHS  . pantoprazole (PROTONIX) IV  40 mg Intravenous Q24H  . polyethylene glycol  17 g Per Tube Daily  . sodium chloride flush  3 mL Intravenous Q12H  . sodium chloride flush  5 mL Intracatheter Q8H  . sodium chloride flush  5 mL Intracatheter Q8H   Continuous Infusions: . sodium chloride 10 mL/hr at 10/07/20 0500  . sodium chloride    . sodium chloride Stopped (10/04/20 1112)  . ampicillin-sulbactam (UNASYN) IV 3 g (10/07/20 0603)  . diltiazem (CARDIZEM) infusion Stopped (10/06/20 1533)  . feeding supplement (VITAL 1.5 CAL) 1,000 mL (10/07/20 4536)  . fluconazole (DIFLUCAN) IV Stopped (10/06/20 2228)   PRN Meds: sodium chloride, acetaminophen, albuterol, docusate, fentaNYL (SUBLIMAZE) injection, melatonin, metoprolol tartrate, ondansetron (ZOFRAN) IV, polyethylene glycol, sodium chloride flush   Vital Signs    Vitals:   10/06/20 1800 10/06/20 1805 10/06/20 2000 10/07/20 0434  BP: (!) 159/83     Pulse: 88 80    Resp: (!) 34 (!) 32    Temp:   99.4 F (37.4 C)   TempSrc:   Oral   SpO2: 90% 94%    Weight:    109.2 kg  Height:         Intake/Output Summary (Last 24 hours) at 10/07/2020 0843 Last data filed at 10/07/2020 0649 Gross per 24 hour  Intake 2173.94 ml  Output 2545 ml  Net -371.06 ml   Last 3 Weights 10/07/2020 10/06/2020 10/05/2020  Weight (lbs) 240 lb 11.9 oz 238 lb 1.6 oz 242 lb 4.6 oz  Weight (kg) 109.2 kg 108 kg 109.9 kg      Telemetry    Normal sinus rhythm with occasional competing junctional rhythm - Personally Reviewed  ECG    No new tracing - Personally Reviewed  Physical Exam   GEN: No acute distress.   Neck: No JVD Cardiac: RRR, no murmurs, rubs, or gallops.  Respiratory: Clear anteriorly GI: Soft, nontender, non-distended. Right abdominal drain with tan output. MS: Trace pretibial and pedal edema; No deformity. Neuro:  Nonfocal  Psych: Speech difficult to understand  Labs    High Sensitivity Troponin:  No results for input(s): TROPONINIHS in the last 720 hours.    Chemistry Recent Labs  Lab 10/03/20 0356 10/04/20 0627 10/05/20 0410 10/06/20 0419 10/07/20 0331  NA 143   < > 148* 148* 147*  K 3.4*   < > 3.2* 3.6 3.8  CL 105   < > 111 113* 111  CO2 25   < > 26 25 24   GLUCOSE 263*   < >  209* 210* 170*  BUN 67*   < > 76* 73* 68*  CREATININE 4.55*   < > 3.40* 2.79* 2.60*  CALCIUM 7.5*   < > 7.9* 8.0* 8.0*  PROT 5.6*  --   --  6.4* 6.8  ALBUMIN 1.9*  1.9*   < > 2.0* 2.3* 2.3*  2.3*  AST 39  --   --  67* 64*  ALT 24  --   --  62* 64*  ALKPHOS 168*  --   --  156* 156*  BILITOT 1.2  --   --  1.2 1.2  GFRNONAA 14*   < > 19* 24* 27*  ANIONGAP 13   < > 11 10 12    < > = values in this interval not displayed.     Hematology Recent Labs  Lab 10/05/20 0410 10/06/20 0419 10/07/20 0331  WBC 13.3* 15.1* 14.8*  RBC 3.86* 3.73* 3.75*  HGB 10.5* 10.1* 10.0*  HCT 32.0* 31.2* 32.2*  MCV 82.9 83.6 85.9  MCH 27.2 27.1 26.7  MCHC 32.8 32.4 31.1  RDW 18.2* 18.0* 18.0*  PLT 316 302 320    BNPNo results for input(s): BNP, PROBNP in the last 168 hours.   DDimer No results  for input(s): DDIMER in the last 168 hours.   Radiology    No results found.  Cardiac Studies   TTE (09/27/2020): 1. Left ventricular ejection fraction, by estimation, is 60 to 65%. The  left ventricle has normal function. The left ventricle has no regional  wall motion abnormalities. Left ventricular diastolic parameters are  consistent with Grade II diastolic  dysfunction (pseudonormalization).  2. Right ventricular systolic function is normal. The right ventricular  size is normal. There is normal pulmonary artery systolic pressure. The  estimated right ventricular systolic pressure is 86.7 mmHg.  3. The mitral valve is normal in structure. No evidence of mitral valve  regurgitation. No evidence of mitral stenosis.  4. No valve vegetation.   Patient Profile     65 y.o. male with h/o HTN, DM2, cholecystitis s/p cholecystectomy last month complicated by abdominal abscess and enterococcal bacteremia, whom we are following due to atrial fibrillation with rapid ventricular response and intermittent junctional rhythm  Assessment & Plan    Atrial fibrillation and junction rhythm: Patient is maintaining sinus rhythm with intermittent competing junctional rhythm. No evidence of a-fib this admission, though it was seen during his presentation with acute cholecystitis in 06/2020.  He was previously on amiodarone, though this had been on hold in the setting of intermittent bradycardia.  I suspect his intermittent bradycardia with competing junctional rhythm is most likely related to elevated vagal tone in the setting of abdominal abscess.  Hopefully, HR will normalize has his acute illness improves.  Continue telemetry monitoring; no indication for amiodarone or AV-nodal blocking agents at this time.  Defer anticoagulation for now, though if there is evidence of recurrent a-fib outside the setting of his acute illness, long-term anticoagulation will need to be considered with a CHADSVASC  score of at least 3.  Sepsis, abdominal abscess and enterococcal bacteremia:  Per IM, surgery, and ID.  Acute kidney injury: Creatinine slowly improving, now off HD.  Per IM and nephrology.  Avoid nephrotoxic agents.  For questions or updates, please contact [place] Please consult [email or web address] for contact info under [facility].     Signed, Nelva Bush, MD  10/07/2020, 8:43 AM

## 2020-10-07 NOTE — Progress Notes (Addendum)
Daily Progress Note   Patient Name: Richard Oconnell       Date: 10/07/2020 DOB: [birth year]  Age: 65 y.o. MRN#: [id number] Attending Physician: Fritzi Mandes, MD Primary Care Physician: [name], DO Admit Date: 09/25/2020  Reason for Consultation/Follow-up: Establishing goals of care  Subjective: Patient just finished working with therapy. His voice is stronger and clear. No coarseness/congestion noted during conversation. Showing improvement.   He is grateful for care and shakes my hand. He states he is feeling better. No family at bedside.   Length of Stay: 12  Current Medications: Scheduled Meds:  . Chlorhexidine Gluconate Cloth  6 each Topical Daily  . docusate  100 mg Per Tube BID  . feeding supplement (NEPRO CARB STEADY)  237 mL Oral TID BM  . heparin injection (subcutaneous)  5,000 Units Subcutaneous Q8H  . insulin aspart  0-20 Units Subcutaneous Q4H  . insulin aspart  3 Units Subcutaneous Q4H  . insulin glargine  15 Units Subcutaneous QHS  . multivitamin  1 tablet Oral QHS  . [START ON 10/08/2020] multivitamin with minerals  1 tablet Oral Daily  . pantoprazole (PROTONIX) IV  40 mg Intravenous Q24H  . polyethylene glycol  17 g Per Tube Daily  . sodium chloride flush  3 mL Intravenous Q12H  . sodium chloride flush  5 mL Intracatheter Q8H  . sodium chloride flush  5 mL Intracatheter Q8H    Continuous Infusions: . sodium chloride Stopped (10/07/20 1521)  . sodium chloride    . sodium chloride Stopped (10/04/20 1112)  . ampicillin-sulbactam (UNASYN) IV Stopped (10/07/20 1239)  . fluconazole (DIFLUCAN) IV Stopped (10/06/20 2228)    PRN Meds: sodium chloride, acetaminophen, albuterol, docusate, fentaNYL (SUBLIMAZE) injection, melatonin, metoprolol tartrate,  ondansetron (ZOFRAN) IV, polyethylene glycol, sodium chloride flush  Physical Exam Pulmonary:     Effort: Pulmonary effort is normal.  Neurological:     Mental Status: He is alert.             Vital Signs: BP (!) 169/86   Pulse 61   Temp 98.7 F (37.1 C)   Resp (!) 28   Ht 6' 3.98" (1.93 m)   Wt 109.2 kg   SpO2 92%   BMI 29.32 kg/m  SpO2: SpO2: 92 % O2 Device: O2 Device: Room Air O2 Flow Rate:  O2 Flow Rate (L/min): 3 L/min  Intake/output summary:   Intake/Output Summary (Last 24 hours) at 10/07/2020 1621 Last data filed at 10/07/2020 1606 Gross per 24 hour  Intake 4115.62 ml  Output 1945 ml  Net 2170.62 ml   LBM: Last BM Date: 10/07/20 Baseline Weight: Weight: 113 kg Most recent weight: Weight: 109.2 kg   Flowsheet Rows   Flowsheet Row Most Recent Value  Intake Tab   Referral Department Critical care  Unit at Time of Referral ICU  Palliative Care Primary Diagnosis Other (Comment)  Date Notified 09/29/20  Palliative Care Type New Palliative care  Reason for referral Clarify Goals of Care  Date of Admission 09/25/20  Date first seen by Palliative Care 09/29/20  # of days Palliative referral response time 0 Day(s)  # of days IP prior to Palliative referral 4  Clinical Assessment   Psychosocial & Spiritual Assessment   Palliative Care Outcomes       Patient Active Problem List   Diagnosis Date Noted  . Postprocedural intraabdominal abscess   . Acute respiratory failure ([place]) 09/28/2020  . Encounter for nasogastric (NG) tube placement   . Altered mental status   . Abscess   . Acute kidney failure, unspecified ([place])   . Sepsis with acute renal failure and septic shock ([place]) 09/25/2020  . S/P laparoscopic cholecystectomy 09/08/2020  . Metabolic syndrome 10/93/2355  . Atrial fibrillation ([place]) 07/09/2020  . Class 1 obesity due to excess calories with serious comorbidity and body mass index (BMI) of 33.0 to 33.9 in adult 07/06/2019  . 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 Overweight (BMI 25.0-29.9) 07/04/2018  . Hyperlipidemia associated with type 2 diabetes mellitus ([place]) 11/30/2016  . 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 Gross hematuria 11/16/2016  . Erectile dysfunction associated with type 2 diabetes mellitus ([place]) 05/26/2016  . Diabetes mellitus with coincident hypertension ([place]) 06/11/2015  . Type 2 diabetes mellitus with diabetic polyneuropathy, without long-term current use of insulin ([place]) 06/11/2015    Palliative Care Assessment & Plan    Recommendations/Plan:  Full code/full scope.     Code Status:    Code Status Orders  (From admission, onward)         Start     Ordered   09/25/20 1738  Full code  Continuous        09/25/20 1739        Code Status History    Date Active Date Inactive Code Status Order ID Comments User Context   09/08/2020 1930 09/09/2020 1917 Full Code [id number]  [name], MD Inpatient   07/09/2020 1819 07/12/2020 2215 Full Code [id number]  CoxBriant Cedar, DO ED   Advance Care Planning Activity        Thank you for allowing the Palliative Medicine Team to assist in the care of this patient.   Total Time 15 min Prolonged Time Billed  no      Greater than 50%  of this time was spent counseling and coordinating care related to the above assessment and plan.  Asencion Gowda, NP  Please contact Palliative Medicine Team phone at [phone] for questions and concerns.

## 2020-10-07 NOTE — Progress Notes (Addendum)
Nutrition Follow-up  DOCUMENTATION CODES:   Obesity unspecified  INTERVENTION:   Nepro Shake po TID, each supplement provides 425 kcal and 19 grams protein  Magic cup TID with meals, each supplement provides 290 kcal and 9 grams of protein  Rena-vit po daily   MVI po daily   NUTRITION DIAGNOSIS:   Inadequate oral intake related to inability to eat (pt sedated and ventilated) as evidenced by NPO status.  GOAL:   Patient will meet greater than or equal to 90% of their needs  -previously met with tube feeds   MONITOR:   Labs,Weight trends,TF tolerance,Skin,I & O's  ASSESSMENT:   65 y/o male with h/o bladder cancer, DM, HTN and cholecystectomy 4/25 who is admitted with intrabdomial abscess, septic shock, ARF and bacteremia.   Pt s/p IR drain revision 5/19  Met with pt in room today. Pt with continued confusion and is only able to answer basic questions. Pt denies any abdominal pain or nausea. Pt tolerating tube feeds well at goal rate via NGT. Pt with some new muscle depletions on NFPE today. Per chart, pt is down 9lbs(4%) since admit but appears to be fairly weight stable since starting tube feeds. Electrolytes within normal limits. Last HD was on 5/18. UOP 2412m. Slight hypernatremia noted. Per RN report, pt is having significant diarrhea. Nasogastric tube removed today. Pt seen by SLP and placed on a dysphagia 1/nectar thick diet. RD will add supplements and MVI to help pt meet his estimated needs. Pt may need MBSS per SLP. Pt having intermittent atrial fibrillation and junction rhythm; cardiology following. Plan is for SNF at discharge.   Medications reviewed and include: colace, heparin, insulin, rena-vit, protonix, miralax, unasyn, diflucan  Labs reviewed: Na 147(H), BUN 68(H), creat 2.60(H), P 4.1 wnl, Mg 2.1 wnl Wbc- 14.8(H), Hgb 10.0(L), Hct 32.2(L) cbgs- 165, 143 x 24 hrs  NUTRITION - FOCUSED PHYSICAL EXAM:  Flowsheet Row Most Recent Value  Orbital Region No  depletion  Upper Arm Region Mild depletion  Thoracic and Lumbar Region No depletion  Buccal Region No depletion  Temple Region Mild depletion  Clavicle Bone Region Mild depletion  Clavicle and Acromion Bone Region Mild depletion  Scapular Bone Region No depletion  Dorsal Hand No depletion  Patellar Region Moderate depletion  Anterior Thigh Region No depletion  Posterior Calf Region No depletion  Edema (RD Assessment) None  Hair Reviewed  Eyes Reviewed  Mouth Reviewed  Skin Reviewed  Nails Reviewed     Diet Order:   Diet Order            Diet NPO time specified  Diet effective now                EDUCATION NEEDS:   No education needs have been identified at this time  Skin:  Skin Assessment: Reviewed RN Assessment (ecchymosis)  Last BM:  5/24- type 7  Height:   Ht Readings from Last 1 Encounters:  09/30/20 6' 3.98" (1.93 m)    Weight:   Wt Readings from Last 1 Encounters:  10/07/20 109.2 kg    Ideal Body Weight:  91.8 kg  BMI:  Body mass index is 29.32 kg/m.  Estimated Nutritional Needs:   Kcal:  2700-3000kcal/day  Protein:  >130g/day  Fluid:  2.4-2.7L/day  CKoleen DistanceMS, RD, LDN Please refer to [facility]for RD and/or RD on-call/weekend/after hours pager

## 2020-10-07 NOTE — Progress Notes (Signed)
[place] [facility] [place] NAME: Richard Oconnell    MR#:  [id number]  DATE OF BIRTH:  Jun 09, 1955  SUBJECTIVE:  transferred to [facility] service. Patient more awake, alert and having meaningful conversation. Discussed about need for removing NG and trying swallow study. Patient is in agreement.  Sister and brother in the room. Patient currently off Cardizem drip. Remains in sinus rhythm. Blood pressure stable  REVIEW OF SYSTEMS:   Review of Systems  Unable to perform ROS: Medical condition   Tolerating Diet: working with speech therapist Tolerating PT: PT to be started  DRUG ALLERGIES:   Allergies  Allergen Reactions  . Cyclobenzaprine Other (See Comments)    Not improving pain and makes him agressive.  . Lisinopril Cough    VITALS:  Blood pressure (!) 156/91, pulse 77, temperature 98.7 F (37.1 C), resp. rate (!) 26, height 6' 3.98" (1.93 m), weight 109.2 kg, SpO2 95 %.  PHYSICAL EXAMINATION:   Physical Exam  GENERAL:  65 y.o.-year-old patient lying in the bed with mild to moderate acute distress. Appears chronically ill. HEENT: Head atraumatic, normocephalic. Oropharynx and nasopharynx clear.HD cath+ LUNGS: decreased breath sounds bilaterally, no wheezing, rales, rhonchi. No use of accessory muscles of respiration.  CARDIOVASCULAR: S1, S2 normal. No murmurs, rubs, or gallops.  ABDOMEN: Soft,nondistended. No organomegaly or mass. GB drain present Foley+ EXTREMITIES: No cyanosis, clubbing or edema b/l.    NEUROLOGIC: Cranial nerves II through XII are intact. No focal Motor or sensory deficits b/l.  Weak, deconditioned PSYCHIATRIC:  patient is alert  And awake SKIN: No obvious rash, lesion, or ulcer.   LABORATORY PANEL:  CBC Recent Labs  Lab 10/07/20 0331  WBC 14.8*  HGB 10.0*  HCT 32.2*  PLT 320    Chemistries  Recent Labs  Lab 10/07/20 0331  NA 147*  K 3.8  CL 111  CO2 24  GLUCOSE 170*  BUN 68*  CREATININE 2.60*   CALCIUM 8.0*  MG 2.1  AST 64*  ALT 64*  ALKPHOS 156*  BILITOT 1.2   Cardiac Enzymes No results for input(s): TROPONINI in the last 168 hours. RADIOLOGY:  No results found. ASSESSMENT AND PLAN:   [name] is a 64 year old gentleman with history of hypertension, bladder cancer, diabetes type 2, acute cholecystitis with cholecystectomy tube placed July 10 2020, followed by robotic assisted cholecystectomy September 08, 2020, in the intensive care unit with septic shock, bacteremia, large abdominal abscess, multiorgan failure with complications of severe metabolic acidosis,  atrial fibrillation , acute renal failure with ATN from sepsis and started on hemodialysis.  Acute hypoxic respiratory failure in the setting of severe metabolic encephalopathy due to septic shock, acute kidney injury secondary to ATN in the setting of sepsis -- patient was intubated and remained on the ventilator for several days. -- Extubated on 973 53 now on room air -- PRN bronchodilators, pulmonary toilet.  Septic shock present on admission-- resolved source large abscess intra-abdominal (culture growing Enterococcus faecalis and Bacteroides fragilis) history of acute cholecystitis with cholecystostomy July 06, 2008 followed by robotic assisted cholecystectomy in April 2022 -- WBC fluctuating -- followed by ID. Continue Unasyn and fluconazole -- dispose percutaneous drain placement by IR into gallbladder. Abscess on 09/25/2020 -- she was exchanged by IR on 5/19 -- followed by general surgery  Acute kidney injury in the setting of sepsis due to ATN -- patient had creatinine of six--- started on hemodialysis. Got two treatments. -- Creatinine trending down 2.7.--  Followed by nephrology. -- Good urine output. Dialysis on hold.  Acute metabolic encephalopathy/toxic in the setting of sepsis AK I -- patient's mentation is improving. -- MRI brain showed no acute abnormality. Chronic microvascular  disease  A fib with RVR -- followed by Dr. Rockey Situ [facility] -- 5/23-- was on Cardizem gtt for few hours--now off. In NSR  Anemia without sign symptoms of obvious bleeding secondary to chronic illness -- transfuse as needed  Nutrition -- patient currently getting NG tube feeding -- speech therapy to evaluate swallow study with MBS test once patient more awake --5/24-- seen by speech therapist. NG tube removed. Swallow eval done at bedside. Consider modified barium swallow study per speech recommendation. Dietitian to follow along side of speech therapy.   Family communication : brother and sister at bedside Consults : nephrology, ID, cardiology, surgery CODE STATUS: full code DVT Prophylaxis : heparin subcu Level of care: Stepdown Status is: Inpatient  Remains inpatient appropriate because:Inpatient level of care appropriate due to severity of illness   Dispo: The patient is from: Home              Anticipated d/c is to: TBD              Patient currently is not medically stable to d/c.   Difficult to place patient No  Patient is critically ill with multiple comorbidities and medical issues. Slowly improving.     TOTAL TIME TAKING CARE OF THIS PATIENT: 35 minutes.  >50% time spent on counselling and coordination of care  Note: This dictation was prepared with Dragon dictation along with smaller phrase technology. Any transcriptional errors that result from this process are unintentional.  Fritzi Mandes M.D    [facility]   CC: Primary care physician; [name], DOPatient ID: Richard Oconnell, male   DOB: [birth year], 65 y.o.   MRN: [id number]

## 2020-10-07 NOTE — Progress Notes (Signed)
Occupational Therapy Treatment Patient Details Name: Richard Oconnell MRN: [id number] DOB: [birth year] Today's Date: 10/07/2020    History of present illness 65 yo male with previous dx of acute cholecystitis s/p cholecystotomy tube 2/24 and then follow up robotic assisted cholecystectomy 4/25, now presents with large abdominal abscess with severe septic shock and BACTEREMIA, with multiorgan failure (liver and renal failure) complicated by severe metabolic acidosis and toxic metabolic encephalopathy.   OT comments  Pt seen for skilled co-tx with PT to address functional mobility and self care tasks. Pt tolerated sitting EOB for ~ 12 minutes with min A and noted L lateral lean. Pt able to grip comb and brushes some of his hair with special attention to avoid IJ catheter. Pt also given suction toothbrush for oral care. He did not want to perform oral care but was agreeable to briefly utilize and then therapist assisted with applying oral mouth moisture gel for comfort. Pt returning to bed with max A of 2 for repositioning. Pt's HR increased to 78 during this session with it's lowest being 41 while supine during bed positioning. Pt tolerated much more EOB activities and able to move UEs against gravity for self care tasks. Although pt tires quickly he made has improved from last session. Pt will continue to benefit from OT intervention with recommendation for SNF at discharge to address functional deficits. Pt's continues to be confused with some tangential speech and min cuing for redirection.   Follow Up Recommendations  SNF;Supervision/Assistance - 24 hour    Equipment Recommendations  Other (comment) (defer to next venue of care)       Precautions / Restrictions Precautions Precautions: Fall Precaution Comments: temporary dialysis left IJ catheter, HOB >30 degrees Restrictions Weight Bearing Restrictions: No       Mobility Bed Mobility Overal bed mobility: Needs Assistance Bed  Mobility: Supine to Sit;Sit to Supine     Supine to sit: Max assist;+2 for physical assistance;HOB elevated Sit to supine: Max assist;+2 for safety/equipment;HOB elevated   General bed mobility comments: assist for trunk and B LE's; 2 assist to boost pt up in bed end of session (using bed sheet)    Transfers                 General transfer comment: Deferred d/t L IJ temporary HD catheter restrictions    Balance Overall balance assessment: Needs assistance Sitting-balance support: Bilateral upper extremity supported;Feet supported Sitting balance-Leahy Scale: Poor Sitting balance - Comments: Min assist d/t L lean in sitting (pt able to maintain sitting balance with tilt of bed's sitting surface to compensate) Postural control: Left lateral lean                                 ADL either performed or assessed with clinical judgement   ADL Overall ADL's : Needs assistance/impaired     Grooming: Wash/dry face;Oral care;Sitting;Minimal assistance                                       Vision Patient Visual Report: No change from baseline            Cognition Arousal/Alertness: Awake/alert   Overall Cognitive Status: No family/caregiver present to determine baseline cognitive functioning  General Comments: Oriented to name and DOB (except not year) and in hospital (but not which hospital or location).  Disoriented to time and situation.        Exercises General Exercises - Lower Extremity Ankle Circles/Pumps: AROM;Strengthening;Both;10 reps;Supine (HOB >30 degrees) Quad Sets: AROM;Strengthening;Both;10 reps;Supine (HOB >30 degrees) Short Arc Quad: AROM;Strengthening;Both;10 reps;Supine (HOB >30 degrees) Heel Slides: AAROM;Strengthening;Both;10 reps;Supine (HOB >30 degrees) Hip ABduction/ADduction: AAROM;Strengthening;Both;10 reps;Supine (HOB >30 degrees)           Pertinent Vitals/  Pain       Pain Assessment: Faces Faces Pain Scale: No hurt Pain Intervention(s): Limited activity within patient's tolerance;Monitored during session;Repositioned  Home Living Family/patient expects to be discharged to:: Private residence Living Arrangements: Other relatives                                          Frequency  Min 2X/week        Progress Toward Goals  OT Goals(current goals can now be found in the care plan section)  Progress towards OT goals: Progressing toward goals  Acute Rehab OT Goals Patient Stated Goal: to go home Time For Goal Achievement: 10/17/20 Potential to Achieve Goals: Good  Plan Discharge plan remains appropriate    Co-evaluation      Reason for Co-Treatment: Necessary to address cognition/behavior during functional activity;For patient/therapist safety;To address functional/ADL transfers PT goals addressed during session: Mobility/safety with mobility;Balance OT goals addressed during session: ADL's and self-care      AM-PAC OT "6 Clicks" Daily Activity     Outcome Measure   Help from another person eating meals?: A Lot Help from another person taking care of personal grooming?: A Lot Help from another person toileting, which includes using toliet, bedpan, or urinal?: Total Help from another person bathing (including washing, rinsing, drying)?: Total Help from another person to put on and taking off regular upper body clothing?: Total Help from another person to put on and taking off regular lower body clothing?: Total 6 Click Score: 8    End of Session    OT Visit Diagnosis: Unsteadiness on feet (R26.81);Muscle weakness (generalized) (M62.81)   Activity Tolerance Treatment limited secondary to agitation;Patient limited by fatigue   Patient Left in bed;with call bell/phone within reach;with bed alarm set   Nurse Communication Mobility status        Time: 1530-1550 OT Time Calculation (min): 20  min  Charges: OT General Charges $OT Visit: 1 Visit OT Treatments $Self Care/Home Management : 8-22 mins  Darleen Crocker, MS, OTR/L , CBIS ascom [phone]  10/07/20, 4:29 PM

## 2020-10-07 NOTE — Progress Notes (Addendum)
Patient alert and oriented to person, place disoriented to time and situation. Patient states that he is not in any pain. Patient had two large bowel movements, patient was cleaned (see daily cares). Patient heart rate switching between sinus brady and junctional rhythm. Patient heart rate has dropped to as low as 34 during the junctional rhythm. Nurse practitioner notified, no new orders were given. Nurse practitioner asked for a blood pressure during the decrease of heart rate. Blood pressure was elevated but patients normal.

## 2020-10-07 NOTE — Progress Notes (Signed)
Physical Therapy Treatment Patient Details Name: Richard Oconnell MRN: [id number] DOB: 02-13-1956 Today's Date: 10/07/2020    History of Present Illness 65 yo male with previous dx of acute cholecystitis s/p cholecystotomy tube 2/24 and then follow up robotic assisted cholecystectomy 4/25, now presents with large abdominal abscess with severe septic shock and BACTEREMIA, with multiorgan failure (liver and renal failure) complicated by severe metabolic acidosis and toxic metabolic encephalopathy.    PT Comments    Pt resting in bed upon PT arrival; agreeable to therapy session.  Oriented to name, DOB (except year), and hospital.  Pt performed B LE ex's semi-supine in bed with assist as needed.  PT/OT co-treatment performed.  Max assist x2 with bed mobility and min assist for sitting balance d/t L lean.  Able to sit on edge of bed for about 12 minutes.  HR 51-78 bpm during sessions activities except briefly down to 41 bpm when pt being repositioned in bed end of session (HR improved to 60's once HOB elevated)--nurse notified.  BP 159/91 semi-supine in bed beginning of session and 157/93 sitting edge of bed.  O2 sats WFL on room air during sessions activities.  Deferred further mobility/standing d/t L IJ temporary HD catheter restrictions.  Will continue to focus on strengthening, sitting balance, and functional mobility as appropriate.   Follow Up Recommendations  SNF     Equipment Recommendations   (TBD at next facility)    Recommendations for Other Services       Precautions / Restrictions Precautions Precautions: Fall Precaution Comments: temporary dialysis left IJ catheter, HOB >30 degrees Restrictions Weight Bearing Restrictions: No    Mobility  Bed Mobility Overal bed mobility: Needs Assistance Bed Mobility: Supine to Sit;Sit to Supine     Supine to sit: Max assist;+2 for physical assistance;HOB elevated Sit to supine: Max assist;+2 for safety/equipment;HOB elevated    General bed mobility comments: assist for trunk and B LE's; 2 assist to boost pt up in bed end of session (using bed sheet)    Transfers                 General transfer comment: Deferred d/t L IJ temporary HD catheter restrictions  Ambulation/Gait             General Gait Details: Deferred d/t L IJ temporary HD catheter restrictions   Stairs             Wheelchair Mobility    Modified Rankin (Stroke Patients Only)       Balance Overall balance assessment: Needs assistance Sitting-balance support: Bilateral upper extremity supported;Feet supported Sitting balance-Leahy Scale: Poor Sitting balance - Comments: Min assist d/t L lean in sitting (pt able to maintain sitting balance with tilt of bed's sitting surface to compensate) Postural control: Left lateral lean                                  Cognition Arousal/Alertness: Awake/alert   Overall Cognitive Status: No family/caregiver present to determine baseline cognitive functioning                                 General Comments: Oriented to name and DOB (except not year) and in hospital (but not which hospital or location).  Disoriented to time and situation.      Exercises General Exercises - Lower Extremity Ankle Circles/Pumps: AROM;Strengthening;Both;10 reps;Supine (  HOB >30 degrees) Quad Sets: AROM;Strengthening;Both;10 reps;Supine (HOB >30 degrees) Short Arc Quad: AROM;Strengthening;Both;10 reps;Supine (HOB >30 degrees) Heel Slides: AAROM;Strengthening;Both;10 reps;Supine (HOB >30 degrees) Hip ABduction/ADduction: AAROM;Strengthening;Both;10 reps;Supine (HOB >30 degrees)    General Comments   Nursing cleared pt for participation in physical therapy.  Pt agreeable to PT session.      Pertinent Vitals/Pain Pain Assessment: No/denies pain Pain Intervention(s): Limited activity within patient's tolerance;Monitored during session;Repositioned    Home Living                       Prior Function            PT Goals (current goals can now be found in the care plan section) Acute Rehab PT Goals Patient Stated Goal: to go home PT Goal Formulation: With patient Progress towards PT goals: Progressing toward goals    Frequency    Min 2X/week      PT Plan Current plan remains appropriate    Co-evaluation PT/OT/SLP Co-Evaluation/Treatment: Yes Reason for Co-Treatment: Necessary to address cognition/behavior during functional activity;For patient/therapist safety;To address functional/ADL transfers PT goals addressed during session: Mobility/safety with mobility;Balance;Strengthening/ROM OT goals addressed during session: ADL's and self-care      AM-PAC PT "6 Clicks" Mobility   Outcome Measure  Help needed turning from your back to your side while in a flat bed without using bedrails?: A Lot Help needed moving from lying on your back to sitting on the side of a flat bed without using bedrails?: Total Help needed moving to and from a bed to a chair (including a wheelchair)?: Total Help needed standing up from a chair using your arms (e.g., wheelchair or bedside chair)?: Total Help needed to walk in hospital room?: Total Help needed climbing 3-5 steps with a railing? : Total 6 Click Score: 7    End of Session Equipment Utilized During Treatment: Gait belt Activity Tolerance: Patient tolerated treatment well Patient left: in bed;with call bell/phone within reach;with bed alarm set Nurse Communication: Mobility status;Precautions;Other (comment) (pt's HR during session) PT Visit Diagnosis: Unsteadiness on feet (R26.81);Muscle weakness (generalized) (M62.81);Difficulty in walking, not elsewhere classified (R26.2)     Time: 4270-6237 PT Time Calculation (min) (ACUTE ONLY): 47 min  Charges:  $Therapeutic Exercise: 8-22 mins $Therapeutic Activity: 8-22 mins                    Leitha Bleak, PT 10/07/20, 4:24 PM

## 2020-10-08 DIAGNOSIS — I480 Paroxysmal atrial fibrillation: Secondary | ICD-10-CM | POA: Diagnosis not present

## 2020-10-08 DIAGNOSIS — T8143XA Infection following a procedure, organ and space surgical site, initial encounter: Secondary | ICD-10-CM | POA: Diagnosis not present

## 2020-10-08 LAB — CBC
HCT: 30.2 % — ABNORMAL LOW (ref 39.0–52.0)
Hemoglobin: 9.5 g/dL — ABNORMAL LOW (ref 13.0–17.0)
MCH: 26.9 pg (ref 26.0–34.0)
MCHC: 31.5 g/dL (ref 30.0–36.0)
MCV: 85.6 fL (ref 80.0–100.0)
Platelets: 310 10*3/uL (ref 150–400)
RBC: 3.53 MIL/uL — ABNORMAL LOW (ref 4.22–5.81)
RDW: 18.2 % — ABNORMAL HIGH (ref 11.5–15.5)
WBC: 13.2 10*3/uL — ABNORMAL HIGH (ref 4.0–10.5)
nRBC: 0 % (ref 0.0–0.2)

## 2020-10-08 LAB — GLUCOSE, CAPILLARY
Glucose-Capillary: 116 mg/dL — ABNORMAL HIGH (ref 70–99)
Glucose-Capillary: 96 mg/dL (ref 70–99)
Glucose-Capillary: 120 mg/dL — ABNORMAL HIGH (ref 70–99)
Glucose-Capillary: 132 mg/dL — ABNORMAL HIGH (ref 70–99)
Glucose-Capillary: 124 mg/dL — ABNORMAL HIGH (ref 70–99)
Glucose-Capillary: 145 mg/dL — ABNORMAL HIGH (ref 70–99)

## 2020-10-08 LAB — RENAL FUNCTION PANEL
Albumin: 2.3 g/dL — ABNORMAL LOW (ref 3.5–5.0)
Anion gap: 11 (ref 5–15)
BUN: 65 mg/dL — ABNORMAL HIGH (ref 8–23)
CO2: 24 mmol/L (ref 22–32)
Calcium: 8.3 mg/dL — ABNORMAL LOW (ref 8.9–10.3)
Chloride: 115 mmol/L — ABNORMAL HIGH (ref 98–111)
Creatinine, Ser: 2.36 mg/dL — ABNORMAL HIGH (ref 0.61–1.24)
GFR, Estimated: 30 mL/min — ABNORMAL LOW (ref >60)
Glucose, Bld: 119 mg/dL — ABNORMAL HIGH (ref 70–99)
Phosphorus: 4.5 mg/dL (ref 2.5–4.6)
Potassium: 3.6 mmol/L (ref 3.5–5.1)
Sodium: 150 mmol/L — ABNORMAL HIGH (ref 135–145)

## 2020-10-08 LAB — MAGNESIUM: Magnesium: 2.1 mg/dL (ref 1.7–2.4)

## 2020-10-08 MED ORDER — DEXTROSE 5 % IV SOLN: INTRAVENOUS | Status: DC

## 2020-10-08 MED ORDER — POLYETHYLENE GLYCOL 3350 17 G PO PACK: 17.0000 g | PACK | Freq: Every day | ORAL | Status: DC | PRN

## 2020-10-08 MED ORDER — ACETAMINOPHEN 325 MG PO TABS
650.0000 mg | ORAL_TABLET | ORAL | Status: DC | PRN
Start: 2020-10-08 — End: 2020-10-15

## 2020-10-08 MED ORDER — INSULIN ASPART 100 UNIT/ML IJ SOLN: 0.0000 [IU] | Freq: Three times a day (TID) | INTRAMUSCULAR | Status: DC

## 2020-10-08 MED ORDER — INSULIN ASPART 100 UNIT/ML IJ SOLN: 0.0000 [IU] | Freq: Every day | INTRAMUSCULAR | Status: DC

## 2020-10-08 MED ORDER — INSULIN ASPART 100 UNIT/ML IJ SOLN
0.0000 [IU] | Freq: Three times a day (TID) | INTRAMUSCULAR | Status: DC
  Administered 2020-10-08: 3 [IU] via SUBCUTANEOUS
  Administered 2020-10-09: 4 [IU] via SUBCUTANEOUS
  Administered 2020-10-09: 3 [IU] via SUBCUTANEOUS
  Administered 2020-10-10: 4 [IU] via SUBCUTANEOUS
  Administered 2020-10-10 – 2020-10-13 (×7): 3 [IU] via SUBCUTANEOUS
  Administered 2020-10-14: 2 [IU] via SUBCUTANEOUS
  Administered 2020-10-14: 4 [IU] via SUBCUTANEOUS
  Administered 2020-10-15 (×2): 3 [IU] via SUBCUTANEOUS
  Filled 2020-10-08 (×15): qty 1

## 2020-10-08 MED ORDER — POLYETHYLENE GLYCOL 3350 17 G PO PACK
17.0000 g | PACK | Freq: Every day | ORAL | Status: DC
  Administered 2020-10-10: 17 g via ORAL
  Filled 2020-10-08 (×2): qty 1

## 2020-10-08 MED ORDER — DOCUSATE SODIUM 50 MG/5ML PO LIQD
100.0000 mg | Freq: Two times a day (BID) | ORAL | Status: DC
  Administered 2020-10-08 – 2020-10-10 (×3): 100 mg via ORAL
  Filled 2020-10-08 (×6): qty 10

## 2020-10-08 MED ORDER — DOCUSATE SODIUM 50 MG/5ML PO LIQD
100.0000 mg | Freq: Every day | ORAL | Status: DC | PRN
  Filled 2020-10-08: qty 10

## 2020-10-08 NOTE — Plan of Care (Signed)
Patient is alert and oriented x 2, patient is disoriented to time and situation. Patient states that his pain is a 0 on a scale of 0 to 10. Patient has remained afebrile. Patients heart rate has maintained in sinus brady and junctional rhythm. Patient stated that he would like to take a shower and attempted to get out of bed. I explained to the patient that we do not have showers in the ICU. He understood and allowed me and the tech to move him back up in bed. Patient woke up demanding to speak with his brother Wille Glaser. I used the hospital phone in the patients room to call Joe and patient is currently on the phone with his brother. Will continue patient care.   Problem: Clinical Measurements: Goal: Will remain free from infection Outcome: Progressing Goal: Diagnostic test results will improve Outcome: Progressing   Problem: Activity: Goal: Risk for activity intolerance will decrease Outcome: Progressing   Problem: Nutrition: Goal: Adequate nutrition will be maintained Outcome: Progressing   Problem: Coping: Goal: Level of anxiety will decrease Outcome: Progressing   Problem: Elimination: Goal: Will not experience complications related to bowel motility Outcome: Progressing Goal: Will not experience complications related to urinary retention Outcome: Progressing   Problem: Pain Managment: Goal: General experience of comfort will improve Outcome: Progressing   Problem: Safety: Goal: Ability to remain free from injury will improve Outcome: Progressing   Problem: Education: Goal: Knowledge of General Education information will improve Description: Including pain rating scale, medication(s)/side effects and non-pharmacologic comfort measures Outcome: Not Progressing   Problem: Health Behavior/Discharge Planning: Goal: Ability to manage health-related needs will improve Outcome: Not Progressing   Problem: Clinical Measurements: Goal: Ability to maintain clinical measurements within  normal limits will improve Outcome: Not Progressing Goal: Cardiovascular complication will be avoided Outcome: Not Progressing   Problem: Skin Integrity: Goal: Risk for impaired skin integrity will decrease Outcome: Not Progressing   Problem: Clinical Measurements: Goal: Respiratory complications will improve Outcome: Not Applicable

## 2020-10-08 NOTE — Progress Notes (Signed)
[place] [facility] [place] NAME: Richard Oconnell    MR#:  [id number]  DATE OF BIRTH:  1956/02/22  SUBJECTIVE:  He is confused, is not oriented to time or place. Speech does not seem reliable  REVIEW OF SYSTEMS:   Review of Systems  Unable to perform ROS: Medical condition   Tolerating Diet: working with speech therapist Tolerating PT: PT to be started  DRUG ALLERGIES:   Allergies  Allergen Reactions  . Cyclobenzaprine Other (See Comments)    Not improving pain and makes him agressive.  . Lisinopril Cough    VITALS:  Blood pressure (!) 148/95, pulse (!) 56, temperature 98.6 F (37 C), temperature source Oral, resp. rate (!) 26, height 6' 3.98" (1.93 m), weight 108.5 kg, SpO2 95 %.  PHYSICAL EXAMINATION:   Physical Exam  General exam: Alert, awake, no distress Respiratory system: Clear to auscultation. Respiratory effort normal. Cardiovascular system:RRR. No murmurs, rubs, gallops. Gastrointestinal system: Abdomen is nondistended, soft and nontender. No organomegaly or masses felt. Normal bowel sounds heard. GB drain in place with purulent material in drain, rectal tube present Central nervous system: No focal neurological deficits. Extremities: No C/C/E, +pedal pulses Skin: No rashes, lesions or ulcers Psychiatry: confused, disoriented   LABORATORY PANEL:  CBC Recent Labs  Lab 10/08/20 1011  WBC 13.2*  HGB 9.5*  HCT 30.2*  PLT 310    Chemistries  Recent Labs  Lab 10/07/20 0331 10/08/20 0428 10/08/20 1010  NA 147*  --  150*  K 3.8  --  3.6  CL 111  --  115*  CO2 24  --  24  GLUCOSE 170*  --  119*  BUN 68*  --  65*  CREATININE 2.60*  --  2.36*  CALCIUM 8.0*  --  8.3*  MG 2.1 2.1  --   AST 64*  --   --   ALT 64*  --   --   ALKPHOS 156*  --   --   BILITOT 1.2  --   --    Cardiac Enzymes No results for input(s): TROPONINI in the last 168 hours. RADIOLOGY:  No results found. ASSESSMENT AND PLAN:   Richard Oconnell is a 65 year old gentleman with history of hypertension, bladder cancer, diabetes type 2, acute cholecystitis with cholecystectomy tube placed July 10 2020, followed by robotic assisted cholecystectomy September 08, 2020, in the intensive care unit with septic shock, bacteremia, large abdominal abscess, multiorgan failure with complications of severe metabolic acidosis,  atrial fibrillation , acute renal failure with ATN from sepsis and started on hemodialysis.  Acute hypoxic respiratory failure in the setting of severe metabolic encephalopathy due to septic shock, acute kidney injury secondary to ATN in the setting of sepsis -- patient was intubated and remained on the ventilator for several days. -- Extubated on 09/30/20 now on room air -- PRN bronchodilators, pulmonary toilet.  Septic shock present on admission-- resolved source large abscess intra-abdominal (culture growing Enterococcus faecalis and Bacteroides fragilis) history of acute cholecystitis with cholecystostomy July 06, 2008 followed by robotic assisted cholecystectomy in April 2022 -- WBC fluctuating -- followed by ID. Continue Unasyn and fluconazole -- dispose percutaneous drain placement by IR into gallbladder. Abscess on 09/25/2020 -- drain was exchanged by IR on 5/19 -- followed by general surgery  Acute kidney injury in the setting of sepsis due to ATN -- patient had creatinine up to 6.13--- started on hemodialysis. Got two treatments. -- Creatinine trending down  2.3.-- Followed by nephrology. -- Good urine output. Dialysis on hold.  Acute metabolic encephalopathy/toxic in the setting of sepsis AK I -- patient's mentation is improving, although does not appear to be back to baseline -- MRI brain showed no acute abnormality. Chronic microvascular disease  A fib with RVR -- followed by Dr. Rockey Situ [facility] -- 5/23-- was on Cardizem gtt for few hours--now off. He is having bradycardia/junctional rhythm at  present --continue to follow and avoid AV nodal blockers  Anemia without sign symptoms of obvious bleeding secondary to chronic illness -- transfuse as needed  Nutrition --5/24-- seen by speech therapist. NG tube removed. Swallow eval done at bedside. Consider modified barium swallow study per speech recommendation. Dietitian to follow along side of speech therapy.  Hypernatremia -likely related to free water deficit -may be contributing to encephalopathy -will start on D5W and monitor   Family communication : no family at bedside, left VM for brother Consults : nephrology, ID, cardiology, surgery CODE STATUS: full code DVT Prophylaxis : heparin subcu Level of care: Stepdown Status is: Inpatient  Remains inpatient appropriate because:Inpatient level of care appropriate due to severity of illness   Dispo: The patient is from: Home              Anticipated d/c is to: TBD              Patient currently is not medically stable to d/c.   Difficult to place patient No  Patient is critically ill with multiple comorbidities and medical issues. Slowly improving.     TOTAL TIME TAKING CARE OF THIS PATIENT: 35 minutes.  >50% time spent on counselling and coordination of care  Note: This dictation was prepared with Dragon dictation along with smaller phrase technology. Any transcriptional errors that result from this process are unintentional.  Kathie Dike M.D    [facility]   CC: Primary care physician; [name], DOPatient ID: Richard Oconnell, male   DOB: 04-Jan-1956, 65 y.o.   MRN: [id number]

## 2020-10-08 NOTE — Progress Notes (Signed)
[place], [place] 10/08/20  Subjective:   Hospital day # 13  Patient seen and evaluated at bedside. Renal function appears to be slowly improving. Creatinine down to 2.6. Urine output 2.9 L over the preceding 24 hours.   Renal: 05/24 0701 - 05/25 0700 In: 2581.7 [P.O.:120; I.V.:212; QI/HK:7425.9; IV Piggyback:600.1] Out: 5638 [VFIEP:3295; Drains:30] Lab Results  Component Value Date   CREATININE 2.60 (H) 10/07/2020   CREATININE 2.79 (H) 10/06/2020   CREATININE 3.40 (H) 10/05/2020      Objective:  Vital signs in last 24 hours:  Temp:  [98.6 F (37 C)-99 F (37.2 C)] 98.6 F (37 C) (05/25 0800) Pulse Rate:  [35-77] 56 (05/25 0200) Resp:  [19-34] 26 (05/25 0800) BP: (143-183)/(75-151) 148/95 (05/25 0800) SpO2:  [92 %-100 %] 95 % (05/25 0800) Weight:  [108.5 kg] 108.5 kg (05/25 0500)  Weight change: -0.7 kg Filed Weights   10/06/20 0440 10/07/20 0434 10/08/20 0500  Weight: 108 kg 109.2 kg 108.5 kg    Intake/Output:    Intake/Output Summary (Last 24 hours) at 10/08/2020 1011 Last data filed at 10/08/2020 0700 Gross per 24 hour  Intake 1403.6 ml  Output 2755 ml  Net -1351.4 ml    Physical Exam: General:  No acute distress, laying in the bed  HEENT  [place]/AT oral mucosa dry  Pulm/lungs  Coarse breath sounds , normal effort  CVS/Heart  regular rhythm, no rub or gallop  Abdomen:   Soft, nontender, BS present  Extremities:  No peripheral edema  Neurologic:  Awake but confused  Skin:  No acute rashes  Foley in place Left IJ temporary cath 09/27/20- ICU team  Basic Metabolic Panel:  Recent Labs  Lab 10/03/20 0356 10/04/20 0627 10/05/20 0410 10/06/20 0419 10/07/20 0331 10/08/20 0428  NA 143 144 148* 148* 147*  --   K 3.4* 3.0* 3.2* 3.6 3.8  --   CL 105 106 111 113* 111  --   CO2 25 27 26 25 24   --   GLUCOSE 263* 190* 209* 210* 170*  --   BUN 67* 73* 76* 73* 68*  --   CREATININE 4.55* 3.99* 3.40* 2.79* 2.60*  --   CALCIUM  7.5* 7.7* 7.9* 8.0* 8.0*  --   MG 2.2 2.3 2.0 2.1 2.1 2.1  PHOS 4.7* 4.3 4.4 3.8 4.1  --      CBC: Recent Labs  Lab 10/03/20 0356 10/04/20 0627 10/05/20 0410 10/06/20 0419 10/07/20 0331  WBC 14.7* 14.6* 13.3* 15.1* 14.8*  NEUTROABS  --   --  9.8* 11.8*  --   HGB 11.5* 10.0* 10.5* 10.1* 10.0*  HCT 34.1* 30.1* 32.0* 31.2* 32.2*  MCV 80.4 82.7 82.9 83.6 85.9  PLT 316 273 316 302 320      Lab Results  Component Value Date   HEPBSAG NON REACTIVE 09/26/2020   HEPBSAB NON REACTIVE 09/26/2020   HEPBIGM NON REACTIVE 09/26/2020      Microbiology:  Recent Results (from the past 240 hour(s))  Culture, blood (single) w Reflex to ID Panel     Status: None   Collection Time: 09/30/20  3:35 PM   Specimen: BLOOD  Result Value Ref Range Status   Specimen Description BLOOD BLOOD RIGHT HAND  Final   Special Requests   Final    BOTTLES DRAWN AEROBIC AND ANAEROBIC Blood Culture adequate volume   Culture   Final    NO GROWTH 5 DAYS Performed at [facility], [address] [place],  [place] [address]    Report Status 10/05/2020 FINAL  Final    Coagulation Studies: No results for input(s): LABPROT, INR in the last 72 hours.  Urinalysis: No results for input(s): COLORURINE, LABSPEC, PHURINE, GLUCOSEU, HGBUR, BILIRUBINUR, KETONESUR, PROTEINUR, UROBILINOGEN, NITRITE, LEUKOCYTESUR in the last 72 hours.  Invalid input(s): APPERANCEUR    Imaging: No results found.   Medications:   . sodium chloride 10 mL/hr at 10/08/20 0700  . sodium chloride    . sodium chloride Stopped (10/04/20 1112)  . ampicillin-sulbactam (UNASYN) IV Stopped (10/08/20 8676)  . fluconazole (DIFLUCAN) IV Stopped (10/07/20 2327)   . Chlorhexidine Gluconate Cloth  6 each Topical Daily  . docusate  100 mg Per Tube BID  . feeding supplement (NEPRO CARB STEADY)  237 mL Oral TID BM  . heparin injection (subcutaneous)  5,000 Units Subcutaneous Q8H  . insulin aspart  0-20 Units Subcutaneous Q4H  .  insulin aspart  3 Units Subcutaneous Q4H  . insulin glargine  15 Units Subcutaneous QHS  . multivitamin  1 tablet Oral QHS  . multivitamin with minerals  1 tablet Oral Daily  . pantoprazole (PROTONIX) IV  40 mg Intravenous Q24H  . polyethylene glycol  17 g Per Tube Daily  . sodium chloride flush  3 mL Intravenous Q12H  . sodium chloride flush  5 mL Intracatheter Q8H  . sodium chloride flush  5 mL Intracatheter Q8H   sodium chloride, acetaminophen, albuterol, docusate, fentaNYL (SUBLIMAZE) injection, melatonin, metoprolol tartrate, ondansetron (ZOFRAN) IV, polyethylene glycol, sodium chloride flush  Assessment/ Plan:  65 y.o. male with hypertension, diabetes mellitus type II, bladder cancer, admitted on 09/25/2020 for Septic shock ([place]) [A41.9, R65.21] Altered mental status, unspecified altered mental status type [R41.82] Postprocedural intraabdominal abscess [T81.43XA] Sepsis with acute renal failure and septic shock, due to unspecified organism, unspecified acute renal failure type ([place]) [A41.9, R65.21, N17.9]   # AKI  baseline creatinine of 1.1, GFR >60 on 08/19/20.  Had dialysis 5/16 and 5/18.   Lab Results  Component Value Date   CREATININE 2.60 (H) 10/07/2020   CREATININE 2.79 (H) 10/06/2020   CREATININE 3.40 (H) 10/05/2020   05/24 0701 - 05/25 0700 In: 2581.7 [P.O.:120; I.V.:212; PP/JK:9326.7; IV Piggyback:600.1] Out: 3005 [Urine:2975; Drains:30]  -Good urine output of 2.9 L.  Remove temporary dialysis catheter.  # Sepsis, Gall bladder Abscess, S/p robotic assisted Lap [place] 09/08/2020 S/p CT guided GB abscess drain placement by VIR, on 5/12 Blood culture positive for Bacteroides and enterococcus on 5/12 - Continue unasyn and fluconazole.    # hyperphosphatemia -Now resolved.  Serum phosphorus 4.1. Lab Results  Component Value Date   CALCIUM 8.0 (L) 10/07/2020   PHOS 4.1 10/07/2020     # Acute resp failure intubated 09/27/20-09/30/2020 Patient continues to have stable  respiratory pattern.  #Hypernatremia. Serum sodium down to 147.  NG tube has been removed.  Encourage patient to drink fluids.  This was also discussed with nursing.   LOS: [place] 5/25/202210:11 AM  [place], [place]  Note: This note was prepared with Dragon dictation. Any transcription errors are unintentional

## 2020-10-08 NOTE — Progress Notes (Signed)
Temporary IJ dialysis catheter removed per order. Patient alert\; site with minimal bleeding. Pressure held until bleeding stopped; placed clean gauze with Tegaderm over site.

## 2020-10-08 NOTE — Progress Notes (Signed)
Progress Note  Patient Name: Richard Oconnell Date of Encounter: 10/08/2020  [facility] Cardiologist: New- Dr. Rockey Situ  Subjective   No acute events overnight, history taking limited by condition of patient.  Denies chest pain, abdominal pain.  Appears tangential in speech.  Inpatient Medications    Scheduled Meds: . Chlorhexidine Gluconate Cloth  6 each Topical Daily  . docusate  100 mg Per Tube BID  . feeding supplement (NEPRO CARB STEADY)  237 mL Oral TID BM  . heparin injection (subcutaneous)  5,000 Units Subcutaneous Q8H  . insulin aspart  0-20 Units Subcutaneous Q4H  . insulin aspart  3 Units Subcutaneous Q4H  . insulin glargine  15 Units Subcutaneous QHS  . multivitamin  1 tablet Oral QHS  . multivitamin with minerals  1 tablet Oral Daily  . pantoprazole (PROTONIX) IV  40 mg Intravenous Q24H  . polyethylene glycol  17 g Per Tube Daily  . sodium chloride flush  3 mL Intravenous Q12H  . sodium chloride flush  5 mL Intracatheter Q8H  . sodium chloride flush  5 mL Intracatheter Q8H   Continuous Infusions: . sodium chloride 10 mL/hr at 10/08/20 0700  . sodium chloride    . sodium chloride Stopped (10/04/20 1112)  . ampicillin-sulbactam (UNASYN) IV Stopped (10/08/20 1497)  . fluconazole (DIFLUCAN) IV Stopped (10/07/20 2327)   PRN Meds: sodium chloride, acetaminophen, albuterol, docusate, fentaNYL (SUBLIMAZE) injection, melatonin, metoprolol tartrate, ondansetron (ZOFRAN) IV, polyethylene glycol, sodium chloride flush   Vital Signs    Vitals:   10/08/20 0100 10/08/20 0200 10/08/20 0500 10/08/20 0800  BP: (!) 158/87 (!) 154/91  (!) 148/95  Pulse:  (!) 56    Resp: (!) 31 (!) 25  (!) 26  Temp:  98.7 F (37.1 C)  98.6 F (37 C)  TempSrc:  Oral  Oral  SpO2:  96%  95%  Weight:   108.5 kg   Height:        Intake/Output Summary (Last 24 hours) at 10/08/2020 0946 Last data filed at 10/08/2020 0700 Gross per 24 hour  Intake 1403.6 ml  Output 2755 ml  Net  -1351.4 ml   Last 3 Weights 10/08/2020 10/07/2020 10/06/2020  Weight (lbs) 239 lb 3.2 oz 240 lb 11.9 oz 238 lb 1.6 oz  Weight (kg) 108.5 kg 109.2 kg 108 kg      Telemetry    Sinus rhythm, heart rate 72- Personally Reviewed  ECG    No new tracing- Personally Reviewed  Physical Exam   GEN: No acute distress.   Neck: No JVD Cardiac: RRR, no murmurs, rubs, or gallops.  Respiratory: Clear to auscultation bilaterally. GI: Soft, nontender, non-distended, abdominal drain noted. MS: No edema; No deformity. Neuro:  Nonfocal  Psych: Speech appears tangential,  Labs    High Sensitivity Troponin:  No results for input(s): TROPONINIHS in the last 720 hours.    Chemistry Recent Labs  Lab 10/03/20 0356 10/04/20 0627 10/05/20 0410 10/06/20 0419 10/07/20 0331  NA 143   < > 148* 148* 147*  K 3.4*   < > 3.2* 3.6 3.8  CL 105   < > 111 113* 111  CO2 25   < > 26 25 24   GLUCOSE 263*   < > 209* 210* 170*  BUN 67*   < > 76* 73* 68*  CREATININE 4.55*   < > 3.40* 2.79* 2.60*  CALCIUM 7.5*   < > 7.9* 8.0* 8.0*  PROT 5.6*  --   --  6.4*  6.8  ALBUMIN 1.9*  1.9*   < > 2.0* 2.3* 2.3*  2.3*  AST 39  --   --  67* 64*  ALT 24  --   --  62* 64*  ALKPHOS 168*  --   --  156* 156*  BILITOT 1.2  --   --  1.2 1.2  GFRNONAA 14*   < > 19* 24* 27*  ANIONGAP 13   < > 11 10 12    < > = values in this interval not displayed.     Hematology Recent Labs  Lab 10/05/20 0410 10/06/20 0419 10/07/20 0331  WBC 13.3* 15.1* 14.8*  RBC 3.86* 3.73* 3.75*  HGB 10.5* 10.1* 10.0*  HCT 32.0* 31.2* 32.2*  MCV 82.9 83.6 85.9  MCH 27.2 27.1 26.7  MCHC 32.8 32.4 31.1  RDW 18.2* 18.0* 18.0*  PLT 316 302 320    BNPNo results for input(s): BNP, PROBNP in the last 168 hours.   DDimer No results for input(s): DDIMER in the last 168 hours.   Radiology    No results found.  Cardiac Studies   TTEcho (09/27/2020): 1. Left ventricular ejection fraction, by estimation, is 60 to 65%. The  left ventricle has  normal function. The left ventricle has no regional  wall motion abnormalities. Left ventricular diastolic parameters are  consistent with Grade II diastolic  dysfunction (pseudonormalization).  2. Right ventricular systolic function is normal. The right ventricular  size is normal. There is normal pulmonary artery systolic pressure. The  estimated right ventricular systolic pressure is 53.7 mmHg.  3. The mitral valve is normal in structure. No evidence of mitral valve  regurgitation. No evidence of mitral stenosis.  4. No valve vegetation.   Patient Profile     65 y.o. male with history of hypertension, diabetes, recent cholecystitis status postcholecystectomy a month ago, complicated with abdominal abscess.  Being seen for atrial fibrillation with RVR.  Assessment & Plan    Paroxysmal A. fib  -Currently in sinus rhythm -Continue to avoid AV nodal agents due to bradycardia, junctional rhythm on amiodarone. -CHA2DS2-VASc score 3.  Avoiding anticoagulation due to current illness/sepsis/abdominal abscess requiring drainage. -Consider long-term anticoagulation if A. fib returns.  2.  Abdominal abscess, sepsis -Antibiotics, management as per surgery, internal medicine team.   Total encounter time 35 minutes  Greater than 50% was spent in counseling and coordination of care with the patient   Signed, Kate Sable, MD  10/08/2020, 9:46 AM

## 2020-10-09 DIAGNOSIS — Z515 Encounter for palliative care: Secondary | ICD-10-CM | POA: Diagnosis not present

## 2020-10-09 DIAGNOSIS — N170 Acute kidney failure with tubular necrosis: Secondary | ICD-10-CM | POA: Diagnosis not present

## 2020-10-09 DIAGNOSIS — R410 Disorientation, unspecified: Secondary | ICD-10-CM

## 2020-10-09 DIAGNOSIS — L0291 Cutaneous abscess, unspecified: Secondary | ICD-10-CM | POA: Diagnosis not present

## 2020-10-09 DIAGNOSIS — I480 Paroxysmal atrial fibrillation: Secondary | ICD-10-CM | POA: Diagnosis not present

## 2020-10-09 DIAGNOSIS — Z7189 Other specified counseling: Secondary | ICD-10-CM | POA: Diagnosis not present

## 2020-10-09 DIAGNOSIS — J9601 Acute respiratory failure with hypoxia: Secondary | ICD-10-CM | POA: Diagnosis not present

## 2020-10-09 LAB — RENAL FUNCTION PANEL
Albumin: 2.4 g/dL — ABNORMAL LOW (ref 3.5–5.0)
Anion gap: 12 (ref 5–15)
BUN: 59 mg/dL — ABNORMAL HIGH (ref 8–23)
CO2: 23 mmol/L (ref 22–32)
Calcium: 8.3 mg/dL — ABNORMAL LOW (ref 8.9–10.3)
Chloride: 114 mmol/L — ABNORMAL HIGH (ref 98–111)
Creatinine, Ser: 2.08 mg/dL — ABNORMAL HIGH (ref 0.61–1.24)
GFR, Estimated: 35 mL/min — ABNORMAL LOW (ref >60)
Glucose, Bld: 128 mg/dL — ABNORMAL HIGH (ref 70–99)
Phosphorus: 4.2 mg/dL (ref 2.5–4.6)
Potassium: 3.3 mmol/L — ABNORMAL LOW (ref 3.5–5.1)
Sodium: 149 mmol/L — ABNORMAL HIGH (ref 135–145)

## 2020-10-09 LAB — GLUCOSE, CAPILLARY
Glucose-Capillary: 118 mg/dL — ABNORMAL HIGH (ref 70–99)
Glucose-Capillary: 120 mg/dL — ABNORMAL HIGH (ref 70–99)
Glucose-Capillary: 122 mg/dL — ABNORMAL HIGH (ref 70–99)
Glucose-Capillary: 155 mg/dL — ABNORMAL HIGH (ref 70–99)

## 2020-10-09 LAB — CBC
HCT: 32.9 % — ABNORMAL LOW (ref 39.0–52.0)
Hemoglobin: 10.3 g/dL — ABNORMAL LOW (ref 13.0–17.0)
MCH: 27 pg (ref 26.0–34.0)
MCHC: 31.3 g/dL (ref 30.0–36.0)
MCV: 86.4 fL (ref 80.0–100.0)
Platelets: 296 10*3/uL (ref 150–400)
RBC: 3.81 MIL/uL — ABNORMAL LOW (ref 4.22–5.81)
RDW: 18.3 % — ABNORMAL HIGH (ref 11.5–15.5)
WBC: 10.3 10*3/uL (ref 4.0–10.5)
nRBC: 0 % (ref 0.0–0.2)

## 2020-10-09 LAB — MAGNESIUM: Magnesium: 2.1 mg/dL (ref 1.7–2.4)

## 2020-10-09 MED ORDER — POTASSIUM CHLORIDE 20 MEQ PO PACK
40.0000 meq | PACK | Freq: Once | ORAL | Status: AC
  Administered 2020-10-09: 40 meq via ORAL
  Filled 2020-10-09: qty 2

## 2020-10-09 MED ORDER — POTASSIUM CHLORIDE CRYS ER 20 MEQ PO TBCR: 40.0000 meq | EXTENDED_RELEASE_TABLET | Freq: Once | ORAL | Status: DC

## 2020-10-09 MED ORDER — ENSURE ENLIVE PO LIQD
237.0000 mL | Freq: Three times a day (TID) | ORAL | Status: DC
  Administered 2020-10-09 – 2020-10-15 (×13): 237 mL via ORAL

## 2020-10-09 MED ORDER — LORAZEPAM 2 MG/ML IJ SOLN: 1.0000 mg | Freq: Four times a day (QID) | INTRAMUSCULAR | Status: DC | PRN

## 2020-10-09 MED ORDER — POTASSIUM CHLORIDE 2 MEQ/ML IV SOLN
INTRAVENOUS | Status: DC
  Filled 2020-10-09 (×11): qty 1000
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
## 2020-10-09 NOTE — Progress Notes (Signed)
Speech Language Pathology Treatment: Dysphagia  Patient Details Name: Richard Oconnell MRN: [id number] DOB: Oct 10, 1955 Today's Date: 10/09/2020 Time: 3614-4315 SLP Time Calculation (min) (ACUTE ONLY): 45 min  Assessment / Plan / Recommendation Clinical Impression  Pt seen for ongoing assessment of swallowing. He appears improved w/ more focused attention today; verbally responsive and able to follow instructions w/ min+ cues. Still min+ tangential in his thoughts/speech. Pt is on RA; wbc wnl.  Pt explained general aspiration precautions and agreed verbally to the need for following them especially sitting upright for all oral intake and drinking slowly w/ small sips. Pt assisted w/ positioning d/t mental status and weakness then given trials of thin liquids, ice chips, purees and soft solids. Inconsistent throat clearing noted (x2 during 15+ sips) w/ thin liquids VIA CUP; no increased/consistent coughing as heard at previous tx session. No further overt clinical s/s of aspiration were noted w/ consistencies; respiratory status remained calm and unlabored, vocal quality clear b/t trials, O2 sats remained 98%. Pt held Cup when drinking following instructions for single, small sips slowly. NO straws were utiilized for better oral control. Oral phase appeared grossly [facility] for bolus management and timely A-P transfer for swallowing though inconsistent slow bolus management was noted moreso when distracted; oral clearing achieved w/ all consistencies given Time. Verbal cues given for follow through w/ po tasks and to re-focus to po trials and oral clearing. Pt tended to Talk frequently; easily distracted.  Recommend upgrade to a Regular diet for ease of food choices; tougher meats cut and gravies added to moisten foods; Thin liquids via Cup. Recommend general aspiration precautions; No straws. Pills Whole in Puree; tray setup and positioning assistance for meals. Monitoring at meals and support w/ feeding as  needed -- pt did a much better job w/ self feeding today. Finger foods may be beneficial. Dietician present and making choices of foods for meals w/ pt post session. ST services will continue to f/u w/ pt for toleration of diet and education as needed while admitted. NSG updated. Precautions posted at bedside.      HPI HPI: Pt is a 65 year old man past medical history of hypertension, diabetes, and bladder cancer who was hospitalized with acute cholecystitis-several months ago 2/24  He was temporized with a percutaneous cholecystostomy tube.          Follow up  cholangiogram via the tube demonstrates that the cystic duct is still obstructed  He was offered him a robot-assisted laparoscopic cholecystectomy, but  deferred this procedure   On 4/25 recently had cholecystectomy along with significant lysis of adhesions on 4/25 here at [facility]. He presents today with somnolence and confusion. Family reports that patient is disoriented the day before yesterday, after which his skin has begun to appear increasingly yellow, +discomfort in the right upper quadrant of his abdomen, liver failure, renal failure, severe acidosis.    Pt is s/p cholecystectomy along with significant lysis of adhesions. He is admitted to ICU for large abd abscess in the gallbladder fossa, jaundice and septic shock, on pressors.  Of Note, pt was orally intubated 09/27/2020-09/30/2020; NG during same and removed 10/07/2020.      SLP Plan  Continue with current plan of care       Recommendations  Diet recommendations: Regular;Thin liquid (cut meats, moistened foods) Liquids provided via: Cup;No straw Medication Administration: Whole meds with puree (for safer swallowing) Supervision: Patient able to self feed;Staff to assist with self feeding;Intermittent supervision to cue for compensatory strategies Compensations: Minimize  environmental distractions;Slow rate;Small sips/bites;Lingual sweep for clearance of pocketing;Follow solids with  liquid Postural Changes and/or Swallow Maneuvers: Out of bed for meals;Seated upright 90 degrees;Upright 30-60 min after meal                General recommendations:  (dietician f/u) Oral Care Recommendations: Oral care BID;Oral care before and after PO;Staff/trained caregiver to provide oral care Follow up Recommendations: None (TBD) SLP Visit Diagnosis: Dysphagia, oropharyngeal phase (R13.12) (mental status decline) Plan: Continue with current plan of care       [place], [place], [place] Pathologist Rehab Services [phone] [facility] 10/09/2020, 5:13 PM

## 2020-10-09 NOTE — Progress Notes (Signed)
Occupational Therapy Treatment Patient Details Name: Richard Oconnell MRN: [id number] DOB: [birth year] Today's Date: 10/09/2020    History of present illness 65 yo male with previous dx of acute cholecystitis s/p cholecystotomy tube 2/24 and then follow up robotic assisted cholecystectomy 4/25, now presents with large abdominal abscess with severe septic shock and BACTEREMIA, with multiorgan failure (liver and renal failure) complicated by severe metabolic acidosis and toxic metabolic encephalopathy.   OT comments  Pt seen for OT Co-Tx with PT this date to maximize pt benefits from therapy session. Pt is in good spirits and willing to attempt mobilization this date. Pt requires MOD/MAX A +2 for safety and equipment mgt for STS and SPS with arm in arm assist from EOB to chair. In chair, OT engages pt in seated g/h tasks including oral care with SETUP and cues to sequence task. Pt initially requires MIN A to manage ADL items in his hands, but ultimately able to complete task with SETUP. Pt STS x1 more trial to place chair alarm with MOD A +1 with RW from chair and 2p to manage lines/leads for safety. Requires MOD verbal cues to sequence MIP x2 per side. Pt left in chair with chair alarm, completing therex with PT. Will continue to follow acutely. Continue to anticipate that pt will require f/u OT services in STR setting.   Follow Up Recommendations  SNF;Supervision/Assistance - 24 hour    Equipment Recommendations  Other (comment) (defer)    Recommendations for Other Services      Precautions / Restrictions Precautions Precautions: Fall Restrictions Weight Bearing Restrictions: No       Mobility Bed Mobility Overal bed mobility: Needs Assistance Bed Mobility: Supine to Sit;Sit to Supine     Supine to sit: +2 for safety/equipment;Max assist     General bed mobility comments: increasd time, HOB elevated, use of bed rails    Transfers Overall transfer level: Needs  assistance Equipment used: Rolling walker (2 wheeled) Transfers: Sit to/from [employer] Transfers Sit to Stand: +2 safety/equipment;Min assist;Mod assist Stand pivot transfers: Min assist;Mod assist;+2 safety/equipment       General transfer comment: increased time, assist to manage lines, cues for safety awareness and hand placement. Pt noted to have some flexion in hips throughout requiring posterior support for safety/fall prevention    Balance Overall balance assessment: Needs assistance Sitting-balance support: Bilateral upper extremity supported;Feet supported Sitting balance-Leahy Scale: Fair Sitting balance - Comments: pt sat EOB x ~ 3-4 minutes with close supervision. Slight posterior lean when performing EOB exercises. Postural control: Left lateral lean Standing balance support: Bilateral upper extremity supported;During functional activity Standing balance-Leahy Scale: Poor Standing balance comment: pt reliant on UE and external support                           ADL either performed or assessed with clinical judgement   ADL Overall ADL's : Needs assistance/impaired     Grooming: Oral care;Set up;Cueing for safety;Cueing for sequencing Grooming Details (indicate cue type and reason): sitting in chair, some difficulty managing items, but                     Toileting- Clothing Manipulation and Hygiene: Maximal assistance;Total assistance;Sit to/from stand Toileting - Clothing Manipulation Details (indicate cue type and reason): STS with 2p assist, one person steadying while OT assists with posterior peri care. Pt does make an effort, but is ulatimately unsuccessful as he is requiring consistent use  of UEs to maintain standing support.             Vision Patient Visual Report: No change from baseline     Perception     Praxis      Cognition Arousal/Alertness: Awake/alert Behavior During Therapy: WFL for tasks  assessed/performed Overall Cognitive Status: No family/caregiver present to determine baseline cognitive functioning                                 General Comments: Pt is alert, he is oriented to self only. Able to follow simple one to two step commands wiht increased processing time. Easily distracted, requiring cues to attend throughout. Several mentions of money and asking "what the hell is going on"        Exercises Other Exercises Other Exercises: OT educates pt re: safety considerations, use of call button, importance of OOB activity. Pt with MIN/MOD reception, poor carryover.   Shoulder Instructions       General Comments      Pertinent Vitals/ Pain       Pain Assessment: No/denies pain  Home Living                                          Prior Functioning/Environment              Frequency  Min 2X/week        Progress Toward Goals  OT Goals(current goals can now be found in the care plan section)  Progress towards OT goals: Progressing toward goals  Acute Rehab OT Goals Patient Stated Goal: none stated Time For Goal Achievement: 10/17/20 Potential to Achieve Goals: Good  Plan Discharge plan remains appropriate    Co-evaluation    PT/OT/SLP Co-Evaluation/Treatment: Yes Reason for Co-Treatment: For patient/therapist safety PT goals addressed during session: Mobility/safety with mobility OT goals addressed during session: ADL's and self-care      AM-PAC OT "6 Clicks" Daily Activity     Outcome Measure   Help from another person eating meals?: A Little Help from another person taking care of personal grooming?: A Little Help from another person toileting, which includes using toliet, bedpan, or urinal?: Total Help from another person bathing (including washing, rinsing, drying)?: A Lot Help from another person to put on and taking off regular upper body clothing?: A Lot Help from another person to put on and  taking off regular lower body clothing?: Total 6 Click Score: 12    End of Session Equipment Utilized During Treatment: Gait belt;Rolling walker  OT Visit Diagnosis: Unsteadiness on feet (R26.81);Muscle weakness (generalized) (M62.81)   Activity Tolerance Treatment limited secondary to agitation;Patient limited by fatigue   Patient Left with call bell/phone within reach;in chair;with chair alarm set   Nurse Communication Mobility status        Time: 4481-8563 OT Time Calculation (min): 26 min  Charges: OT General Charges $OT Visit: 1 Visit OT Treatments $Self Care/Home Management : 8-22 mins  Gerrianne Scale, [place], OTR/L ascom [phone] 10/09/20, 3:43 PM

## 2020-10-09 NOTE — Progress Notes (Signed)
[place], [place] 10/09/20  Subjective:   Hospital day # 14  Patient seen and evaluated at bedside. Confused Renal function continues to improve Creatinine is 2 Urine output 4 L over the preceding 24 hours.   Renal: 05/25 0701 - 05/26 0700 In: 1604.2 [I.V.:1235.3; IV Piggyback:368.9] Out: 6010 [Urine:4050; Drains:35] Lab Results  Component Value Date   CREATININE 2.08 (H) 10/09/2020   CREATININE 2.36 (H) 10/08/2020   CREATININE 2.60 (H) 10/07/2020      Objective:  Vital signs in last 24 hours:  Temp:  [97.9 F (36.6 C)-98.7 F (37.1 C)] 98.4 F (36.9 C) (05/26 0800) Pulse Rate:  [33-78] 39 (05/26 0800) Resp:  [18-31] 22 (05/26 0800) BP: (136-169)/(72-98) 153/98 (05/26 0800) SpO2:  [92 %-97 %] 96 % (05/26 0800) Weight:  [102.9 kg] 102.9 kg (05/26 0500)  Weight change: -5.6 kg Filed Weights   10/07/20 0434 10/08/20 0500 10/09/20 0500  Weight: 109.2 kg 108.5 kg 102.9 kg    Intake/Output:    Intake/Output Summary (Last 24 hours) at 10/09/2020 0901 Last data filed at 10/09/2020 0836 Gross per 24 hour  Intake 1841.9 ml  Output 4435 ml  Net -2593.1 ml    Physical Exam: General:  No acute distress, laying in the bed  HEENT  [place]/AT oral mucosa dry  Pulm/lungs  Coarse breath sounds , normal effort  CVS/Heart  regular rhythm, no rub or gallop  Abdomen:   Soft, nontender, BS present  Extremities:  trace peripheral edema  Neurologic:  Awake but confused  Skin:  No acute rashes  Foley in place Left IJ temporary cath 09/27/20- ICU team  Basic Metabolic Panel:  Recent Labs  Lab 10/05/20 0410 10/06/20 0419 10/07/20 0331 10/08/20 0428 10/08/20 1010 10/09/20 0445  NA 148* 148* 147*  --  150* 149*  K 3.2* 3.6 3.8  --  3.6 3.3*  CL 111 113* 111  --  115* 114*  CO2 26 25 24   --  24 23  GLUCOSE 209* 210* 170*  --  119* 128*  BUN 76* 73* 68*  --  65* 59*  CREATININE 3.40* 2.79* 2.60*  --  2.36* 2.08*  CALCIUM 7.9* 8.0* 8.0*  --   8.3* 8.3*  MG 2.0 2.1 2.1 2.1  --  2.1  PHOS 4.4 3.8 4.1  --  4.5 4.2     CBC: Recent Labs  Lab 10/05/20 0410 10/06/20 0419 10/07/20 0331 10/08/20 1011 10/09/20 0445  WBC 13.3* 15.1* 14.8* 13.2* 10.3  NEUTROABS 9.8* 11.8*  --   --   --   HGB 10.5* 10.1* 10.0* 9.5* 10.3*  HCT 32.0* 31.2* 32.2* 30.2* 32.9*  MCV 82.9 83.6 85.9 85.6 86.4  PLT 316 302 320 310 296      Lab Results  Component Value Date   HEPBSAG NON REACTIVE 09/26/2020   HEPBSAB NON REACTIVE 09/26/2020   HEPBIGM NON REACTIVE 09/26/2020      Microbiology:  Recent Results (from the past 240 hour(s))  Culture, blood (single) w Reflex to ID Panel     Status: None   Collection Time: 09/30/20  3:35 PM   Specimen: BLOOD  Result Value Ref Range Status   Specimen Description BLOOD BLOOD RIGHT HAND  Final   Special Requests   Final    BOTTLES DRAWN AEROBIC AND ANAEROBIC Blood Culture adequate volume   Culture   Final    NO GROWTH 5 DAYS Performed at [facility], [address] [place], [place] [address]  Report Status 10/05/2020 FINAL  Final    Coagulation Studies: No results for input(s): LABPROT, INR in the last 72 hours.  Urinalysis: No results for input(s): COLORURINE, LABSPEC, PHURINE, GLUCOSEU, HGBUR, BILIRUBINUR, KETONESUR, PROTEINUR, UROBILINOGEN, NITRITE, LEUKOCYTESUR in the last 72 hours.  Invalid input(s): APPERANCEUR    Imaging: No results found.   Medications:   . sodium chloride 10 mL/hr at 10/09/20 0836  . sodium chloride    . sodium chloride Stopped (10/04/20 1112)  . ampicillin-sulbactam (UNASYN) IV Stopped (10/09/20 0618)  . dextrose 75 mL/hr at 10/09/20 0836   . Chlorhexidine Gluconate Cloth  6 each Topical Daily  . docusate  100 mg Oral BID  . feeding supplement (NEPRO CARB STEADY)  237 mL Oral TID BM  . heparin injection (subcutaneous)  5,000 Units Subcutaneous Q8H  . insulin aspart  0-20 Units Subcutaneous TID WC  . insulin aspart  0-5 Units Subcutaneous  QHS  . multivitamin  1 tablet Oral QHS  . multivitamin with minerals  1 tablet Oral Daily  . pantoprazole (PROTONIX) IV  40 mg Intravenous Q24H  . polyethylene glycol  17 g Oral Daily  . potassium chloride  40 mEq Oral Once  . sodium chloride flush  3 mL Intravenous Q12H   sodium chloride, acetaminophen, albuterol, docusate, melatonin, metoprolol tartrate, ondansetron (ZOFRAN) IV, polyethylene glycol, sodium chloride flush  Assessment/ Plan:  65 y.o. male with hypertension, diabetes mellitus type II, bladder cancer, admitted on 09/25/2020 for Septic shock ([place]) [A41.9, R65.21] Altered mental status, unspecified altered mental status type [R41.82] Postprocedural intraabdominal abscess [T81.43XA] Sepsis with acute renal failure and septic shock, due to unspecified organism, unspecified acute renal failure type ([place]) [A41.9, R65.21, N17.9]   # AKI  baseline creatinine of 1.1, GFR >60 on 08/19/20.  Had dialysis 5/16 and 5/18.   Lab Results  Component Value Date   CREATININE 2.08 (H) 10/09/2020   CREATININE 2.36 (H) 10/08/2020   CREATININE 2.60 (H) 10/07/2020   05/25 0701 - 05/26 0700 In: 1604.2 [I.V.:1235.3; IV Piggyback:368.9] Out: 6644 [Urine:4050; Drains:35]  - Adequate UOP 4L - Foley in place - increased IVF-D5 @ 100 for hypernatremia.  # Sepsis, Gall bladder Abscess, S/p robotic assisted Lap Chole 09/08/2020 S/p CT guided GB abscess drain placement by VIR, on 5/12 Blood culture positive for Bacteroides and enterococcus on 5/12 -Unasyn and fluconazole.    # hyperphosphatemia - Serum phosphorus 4.2, at goal. Lab Results  Component Value Date   CALCIUM 8.3 (L) 10/09/2020   PHOS 4.2 10/09/2020     # Acute resp failure intubated 09/27/20-09/30/2020 Currently on room air  #Hypernatremia. Serum sodium down to 147.  NG tube has been removed.  Patient on pureed diet and requesting water. Nursing notified Increased IVF-see above   LOS: [place] 5/26/20229:01  [place], [place]

## 2020-10-09 NOTE — Progress Notes (Addendum)
Daily Progress Note   Patient Name: Richard Oconnell       Date: 10/09/2020 DOB: 1956/04/27  Age: 65 y.o. MRN#: [id number] Attending Physician: Kathie Dike, MD Primary Care Physician: [name], DO Admit Date: 09/25/2020  Reason for Consultation/Follow-up: Establishing goals of care  Subjective: Per notes, patient has been agitated with staff. Patient is sitting in bedside chair at this time.  He denies complaint.  He is confused and has flight of ideas- jumping rapidly from subject to subject, and discussing things such as his diet and thickened liquids, with the next sentence being statements about things that have happened to him, and conspiracy theories.  Unable to have goals of care conversation at this time.  Continue established goals of care: full code/ full scope.  If cardiology is amenable, could initiate Risperdal 0.5mg  BID daily with increase daily as needed to help with delirium and agitation.     Length of Stay: 14  Current Medications: Scheduled Meds:  . Chlorhexidine Gluconate Cloth  6 each Topical Daily  . docusate  100 mg Oral BID  . feeding supplement (NEPRO CARB STEADY)  237 mL Oral TID BM  . heparin injection (subcutaneous)  5,000 Units Subcutaneous Q8H  . insulin aspart  0-20 Units Subcutaneous TID WC  . insulin aspart  0-5 Units Subcutaneous QHS  . multivitamin  1 tablet Oral QHS  . multivitamin with minerals  1 tablet Oral Daily  . pantoprazole (PROTONIX) IV  40 mg Intravenous Q24H  . polyethylene glycol  17 g Oral Daily  . sodium chloride flush  3 mL Intravenous Q12H    Continuous Infusions: . sodium chloride Stopped (10/09/20 1150)  . sodium chloride    . sodium chloride Stopped (10/04/20 1112)  . ampicillin-sulbactam (UNASYN) IV  Stopped (10/09/20 1220)  . dextrose 75 mL/hr at 10/09/20 1222    PRN Meds: sodium chloride, acetaminophen, albuterol, docusate, melatonin, metoprolol tartrate, ondansetron (ZOFRAN) IV, polyethylene glycol, sodium chloride flush  Physical Exam Pulmonary:     Effort: Pulmonary effort is normal.  Neurological:     Mental Status: He is alert.             Vital Signs: BP (!) 148/68   Pulse 61   Temp 98.4 F (36.9 C) (Axillary)   Resp (!) 22   Ht  6' 3.98" (1.93 m)   Wt 102.9 kg   SpO2 95%   BMI 27.63 kg/m  SpO2: SpO2: 95 % O2 Device: O2 Device: Room Air O2 Flow Rate: O2 Flow Rate (L/min): 3 L/min  Intake/output summary:   Intake/Output Summary (Last 24 hours) at 10/09/2020 1249 Last data filed at 10/09/2020 1222 Gross per 24 hour  Intake 2256.86 ml  Output 3945 ml  Net -1688.14 ml   LBM: Last BM Date: 10/07/20 Baseline Weight: Weight: 113 kg Most recent weight: Weight: 102.9 kg       Flowsheet Rows   Flowsheet Row Most Recent Value  Intake Tab   Referral Department Critical care  Unit at Time of Referral ICU  Palliative Care Primary Diagnosis Other (Comment)  Date Notified 09/29/20  Palliative Care Type New Palliative care  Reason for referral Clarify Goals of Care  Date of Admission 09/25/20  Date first seen by Palliative Care 09/29/20  # of days Palliative referral response time 0 Day(s)  # of days IP prior to Palliative referral 4  Clinical Assessment   Psychosocial & Spiritual Assessment   Palliative Care Outcomes       Patient Active Problem List   Diagnosis Date Noted  . Postprocedural intraabdominal abscess   . Acute respiratory failure ([place]) 09/28/2020  . Encounter for nasogastric (NG) tube placement   . Altered mental status   . Abscess   . Acute kidney failure, unspecified ([place])   . Sepsis with acute renal failure and septic shock ([place]) 09/25/2020  . S/P laparoscopic cholecystectomy 09/08/2020  . Metabolic syndrome 42/59/5638  . Atrial  fibrillation ([place]) 07/09/2020  . Class 1 obesity due to excess calories with serious comorbidity and body mass index (BMI) of 33.0 to 33.9 in adult 07/06/2019  . Malignant neoplasm of urinary bladder ([place]) 12/06/2018  . Overweight (BMI 25.0-29.9) 07/04/2018  . Hyperlipidemia associated with type 2 diabetes mellitus ([place]) 11/30/2016  . 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 Gross hematuria 11/16/2016  . Erectile dysfunction associated with type 2 diabetes mellitus ([place]) 05/26/2016  . Diabetes mellitus with coincident hypertension ([place]) 06/11/2015  . Type 2 diabetes mellitus with diabetic polyneuropathy, without long-term current use of insulin ([place]) 06/11/2015    Palliative Care Assessment & Plan    Recommendations/Plan:  Continue full code full scope.  If cardiology is amenable, could initiate Risperdal 0.5mg  BID daily with increase daily as needed to help with agitation.      Code Status:    Code Status Orders  (From admission, onward)         Start     Ordered   09/25/20 1738  Full code  Continuous        09/25/20 1739        Code Status History    Date Active Date Inactive Code Status Order ID Comments User Context   09/08/2020 1930 09/09/2020 1917 Full Code [id number]  [name], MD Inpatient   07/09/2020 1819 07/12/2020 2215 Full Code [id number]  CoxBriant Cedar, DO ED   Advance Care Planning Activity       Thank you for allowing the Palliative Medicine Team to assist in the care of this patient.   Total Time 15 min Prolonged Time Billed no      Greater than 50%  of this time was spent counseling and coordinating care related to the above assessment and plan.  Asencion Gowda, NP  Please contact Palliative Medicine Team phone at [phone] for questions and concerns.

## 2020-10-09 NOTE — TOC Progression Note (Signed)
Transition of Care [facility]) - Progression Note    Patient Details  Name: Richard Oconnell MRN: [id number] Date of Birth: Jul 14, 1955  Transition of Care [facility]) CM/SW [place], [place] Phone Number: [phone] 10/09/2020, 4:48 PM  Clinical Narrative:     CSW spoke with patient and family Jasso,Joe (Brother) [phone] and Luanna Salk (sister) [phone] about discharge plan.  CSW spoke about placement process and insurance authorization estimated timeline.       Expected Discharge Plan: [place] Barriers to Discharge: Continued Medical Work up  Expected Discharge Plan and Services Expected Discharge Plan: [place] In-house Referral: Clinical Social Work   Post Acute Care Choice: [place] Living arrangements for the past 2 months: Single Family Home                                       Social Determinants of Health (SDOH) Interventions    Readmission Risk Interventions No flowsheet data found.

## 2020-10-09 NOTE — Progress Notes (Signed)
Physical Therapy Treatment Patient Details Name: Richard Oconnell MRN: [id number] DOB: 03/19/1956 Today's Date: 10/09/2020    History of Present Illness 65 yo male with previous dx of acute cholecystitis s/p cholecystotomy tube 2/24 and then follow up robotic assisted cholecystectomy 4/25, now presents with large abdominal abscess with severe septic shock and BACTEREMIA, with multiorgan failure (liver and renal failure) complicated by severe metabolic acidosis and toxic metabolic encephalopathy.    PT Comments    PT/OT co treat 2/2 to pt requiring +2 assistance. Upon working with pt, he is really only a +1 for future sessions. He demonstrated great improvements since OT/PT evals. He is alert and conversational however has cognition deficits. Easily distracted and talking about off wall topics. He was however cooperative and able to consistently follow desired commands/task. Was able to roll R to short sit with +2 for safety. Really was max of one.  Sat EOB x several minutes prior to stand 2 x with +2 HHA assist. Pt does have SOB noted but HR was stable. Pt proceeded to taking ~ 5 steps to turn to recliner. Stood form Psychologist, occupational to [employer] with mod assist of one + vcs for technique and sequencing. Although pt did much better today with therapy, he will still require rehab at [facility] to address deficits while assisting pt to PLOF. RN/RN tech in room at conclusion of session with call bell in reach and chair alarm in place. Acute PT will continue to follow and progress as able per POC.    Follow Up Recommendations  SNF     Equipment Recommendations  Other (comment) (defer to next level of care)       Precautions / Restrictions Precautions Precautions: Fall Precaution Comments: temporary dialysis left IJ catheter, HOB >30 degrees Restrictions Weight Bearing Restrictions: No    Mobility  Bed Mobility Overal bed mobility: Needs Assistance Bed Mobility: Supine to Sit;Sit to Supine Rolling: +2 for  safety/equipment;Max assist   Supine to sit: +2 for safety/equipment;Max assist Sit to supine: Max assist;+2 for safety/equipment   General bed mobility comments: Pt is a max of one to roll to short sit however had +2 assist OT/PT for safety    Transfers Overall transfer level: Needs assistance Equipment used: Rolling walker (2 wheeled) Transfers: Sit to/from Stand Sit to Stand: +2 safety/equipment;Min assist;Mod assist         General transfer comment: pt stood 2 x EOB with + 2 assistance but was able to stand one x from recliner with mod assist +1 + vcs. Pt does have slight posterior lean/push upon standing but ableto correct with vcs and technique improvements  Ambulation/Gait Ambulation/Gait assistance: Min assist;+2 safety/equipment;Mod assist Gait Distance (Feet): 5 Feet Assistive device: 2 person hand held assist Gait Pattern/deviations: Step-to pattern;Narrow base of support;Shuffle Gait velocity: decreased   General Gait Details: needs prompting to lift feet to clear floor for taking steps. INcreased time required but only required min-mod assist of one. Slightly impulsive at times. +2 HHA however no RW in room. Will benefit from RW in future.       Balance Overall balance assessment: Needs assistance Sitting-balance support: Bilateral upper extremity supported;Feet supported Sitting balance-Leahy Scale: Fair Sitting balance - Comments: pt sat EOB x ~ 3-4 minutes with close supervision. Slight posterior lean when performing EOB exercises.   Standing balance support: Bilateral upper extremity supported;During functional activity Standing balance-Leahy Scale: Poor Standing balance comment: pt reliant on UE support        Cognition Arousal/Alertness: Awake/alert  Behavior During Therapy: WFL for tasks assessed/performed Overall Cognitive Status: No family/caregiver present to determine baseline cognitive functioning        General Comments: Pt is A and able to  answer orientation questions with prompting but overall ciogntion deficits present. Pt is easily distracted and needs cues to saty on task. Pt does state several off the wall comments throughout.             Pertinent Vitals/Pain Pain Assessment: No/denies pain Pain Score: 0-No pain Pain Descriptors / Indicators: Sharp Pain Intervention(s): Limited activity within patient's tolerance;Monitored during session;Premedicated before session;Repositioned           PT Goals (current goals can now be found in the care plan section) Acute Rehab PT Goals Patient Stated Goal: none stated Progress towards PT goals: Progressing toward goals    Frequency    Min 2X/week      PT Plan Current plan remains appropriate    Co-evaluation PT/OT/SLP Co-Evaluation/Treatment: Yes Reason for Co-Treatment: For patient/therapist safety PT goals addressed during session: Mobility/safety with mobility;Balance;Proper use of DME;Strengthening/ROM OT goals addressed during session: ADL's and self-care      AM-PAC PT "6 Clicks" Mobility   Outcome Measure  Help needed turning from your back to your side while in a flat bed without using bedrails?: A Lot Help needed moving from lying on your back to sitting on the side of a flat bed without using bedrails?: A Lot Help needed moving to and from a bed to a chair (including a wheelchair)?: A Lot Help needed standing up from a chair using your arms (e.g., wheelchair or bedside chair)?: A Lot Help needed to walk in hospital room?: A Lot Help needed climbing 3-5 steps with a railing? : Total 6 Click Score: 11    End of Session Equipment Utilized During Treatment: Gait belt Activity Tolerance: Patient tolerated treatment well Patient left: in bed;with call bell/phone within reach;with bed alarm set Nurse Communication: Mobility status;Precautions PT Visit Diagnosis: Unsteadiness on feet (R26.81);Muscle weakness (generalized) (M62.81);Difficulty in  walking, not elsewhere classified (R26.2)     Time: 4818-5909 PT Time Calculation (min) (ACUTE ONLY): 32 min  Charges:  $Therapeutic Activity: 8-22 mins                     Julaine Fusi PTA 10/09/20, 10:40 AM

## 2020-10-09 NOTE — Progress Notes (Signed)
[facility] NAME: Richard Oconnell    MR#:  [id number]  DATE OF BIRTH:  May 16, 1956  SUBJECTIVE:  Remains confused, sitting up in chair  REVIEW OF SYSTEMS:   Review of Systems  Unable to perform ROS: Medical condition   Tolerating Diet: working with speech therapist Tolerating PT: PT to be started  DRUG ALLERGIES:   Allergies  Allergen Reactions  . Cyclobenzaprine Other (See Comments)    Not improving pain and makes him agressive.  . Lisinopril Cough    VITALS:  Blood pressure (!) 167/89, pulse 65, temperature 98.6 F (37 C), temperature source Axillary, resp. rate 20, height 6' 3.98" (1.93 m), weight 102.9 kg, SpO2 98 %.  PHYSICAL EXAMINATION:   Physical Exam  General exam: Alert, awake, no distress Respiratory system: Clear to auscultation. Respiratory effort normal. Cardiovascular system:RRR. No murmurs, rubs, gallops. Gastrointestinal system: Abdomen is nondistended, soft and nontender. No organomegaly or masses felt. Normal bowel sounds heard. RUQ drain with minimal output Central nervous system:  No focal neurological deficits. Extremities: No C/C/E, +pedal pulses Skin: No rashes, lesions or ulcers Psychiatry: still confused, speech is tangential     LABORATORY PANEL:  CBC Recent Labs  Lab 10/09/20 0445  WBC 10.3  HGB 10.3*  HCT 32.9*  PLT 296    Chemistries  Recent Labs  Lab 10/07/20 0331 10/08/20 0428 10/09/20 0445  NA 147*   < > 149*  K 3.8   < > 3.3*  CL 111   < > 114*  CO2 24   < > 23  GLUCOSE 170*   < > 128*  BUN 68*   < > 59*  CREATININE 2.60*   < > 2.08*  CALCIUM 8.0*   < > 8.3*  MG 2.1   < > 2.1  AST 64*  --   --   ALT 64*  --   --   ALKPHOS 156*  --   --   BILITOT 1.2  --   --    < > = values in this interval not displayed.   Cardiac Enzymes No results for input(s): TROPONINI in the last 168 hours. RADIOLOGY:  No results found. ASSESSMENT AND PLAN:   [name] is  a 65 year old gentleman with history of hypertension, bladder cancer, diabetes type 2, acute cholecystitis with cholecystectomy tube placed July 10 2020, followed by robotic assisted cholecystectomy September 08, 2020, in the intensive care unit with septic shock, bacteremia, large abdominal abscess, multiorgan failure with complications of severe metabolic acidosis,  atrial fibrillation , acute renal failure with ATN from sepsis and started on hemodialysis.  Acute hypoxic respiratory failure in the setting of severe metabolic encephalopathy due to septic shock, acute kidney injury secondary to ATN in the setting of sepsis -- patient was intubated and remained on the ventilator for several days. -- Extubated on 09/30/20 now on room air -- PRN bronchodilators, pulmonary toilet.  Septic shock present on admission-- resolved source large abscess intra-abdominal (culture growing Enterococcus faecalis and Bacteroides fragilis) history of acute cholecystitis with cholecystostomy July 06, 2008 followed by robotic assisted cholecystectomy in April 2022 -- WBC fluctuating -- followed by ID. Currently on unasyn. He received 3 doses of IV fluconazole that completed on 5/23 -- dispose percutaneous drain placement by IR into gallbladder. Abscess on 09/25/2020 -- drain was exchanged by IR on 5/19 -- followed by general surgery -will likely need follow up with IR drain clinic in next  few weeks  Acute kidney injury in the setting of sepsis due to ATN -- patient had creatinine up to 6.13--- started on hemodialysis. Got two treatments. -- Creatinine trending down 2.08-- Followed by nephrology. -- Good urine output. Dialysis on hold.  Acute metabolic encephalopathy/toxic in the setting of sepsis AK I -- remains confused/delirious, likely metabolic from renal failure, electrolyte abnormalities and icu delirium -- MRI brain showed no acute abnormality. Chronic microvascular disease -continue to monitor  A  fib with RVR -- followed by Dr. Rockey [facility] -- 5/23-- was on Cardizem gtt for few hours--now off. He is having bradycardia/junctional rhythm at present --continue to follow and avoid AV nodal blockers  Anemia without sign symptoms of obvious bleeding secondary to chronic illness -- transfuse as needed  Nutrition --5/24-- seen by speech therapist. NG tube removed. Swallow eval done at bedside.  -currently on dysphagia 1 with nectar thick liquids -mental status appears to be improving, will ask speech therapy to reasses if diet can be advanced.   Hypernatremia -likely related to free water deficit -may be contributing to encephalopathy -will start on D5W and monitor  Hypokalemia -replace   Family communication : discussed with brother at bedside Consults : nephrology, ID, cardiology, surgery CODE STATUS: full code DVT Prophylaxis : heparin subcu Level of care: Progressive Cardiac Status is: Inpatient  Remains inpatient appropriate because:Inpatient level of care appropriate due to severity of illness   Dispo: The patient is from: Home              Anticipated d/c is to: SNF              Patient currently is not medically stable to d/c.   Difficult to place patient No  Patient is critically ill with multiple comorbidities and medical issues. Slowly improving.     TOTAL TIME TAKING CARE OF THIS PATIENT: 35 minutes.  >50% time spent on counselling and coordination of care  Note: This dictation was prepared with Dragon dictation along with smaller phrase technology. Any transcriptional errors that result from this process are unintentional.  Kathie Dike M.D    [facility]   CC: Primary care physician; [name], DOPatient ID: [name], male   DOB: 1955-07-19, 65 y.o.   MRN: [id number]

## 2020-10-10 ENCOUNTER — Telehealth: Payer: Self-pay

## 2020-10-10 DIAGNOSIS — J9601 Acute respiratory failure with hypoxia: Secondary | ICD-10-CM | POA: Diagnosis not present

## 2020-10-10 DIAGNOSIS — N170 Acute kidney failure with tubular necrosis: Secondary | ICD-10-CM | POA: Diagnosis not present

## 2020-10-10 DIAGNOSIS — E43 Unspecified severe protein-calorie malnutrition: Secondary | ICD-10-CM | POA: Insufficient documentation

## 2020-10-10 DIAGNOSIS — L0291 Cutaneous abscess, unspecified: Secondary | ICD-10-CM | POA: Diagnosis not present

## 2020-10-10 DIAGNOSIS — R4182 Altered mental status, unspecified: Secondary | ICD-10-CM | POA: Diagnosis not present

## 2020-10-10 LAB — CBC
HCT: 28.4 % — ABNORMAL LOW (ref 39.0–52.0)
Hemoglobin: 9.1 g/dL — ABNORMAL LOW (ref 13.0–17.0)
MCH: 27.3 pg (ref 26.0–34.0)
MCHC: 32 g/dL (ref 30.0–36.0)
MCV: 85.3 fL (ref 80.0–100.0)
Platelets: 268 10*3/uL (ref 150–400)
RBC: 3.33 MIL/uL — ABNORMAL LOW (ref 4.22–5.81)
RDW: 17.9 % — ABNORMAL HIGH (ref 11.5–15.5)
WBC: 8.1 10*3/uL (ref 4.0–10.5)
nRBC: 0 % (ref 0.0–0.2)

## 2020-10-10 LAB — MAGNESIUM: Magnesium: 1.9 mg/dL (ref 1.7–2.4)

## 2020-10-10 LAB — GLUCOSE, CAPILLARY
Glucose-Capillary: 125 mg/dL — ABNORMAL HIGH (ref 70–99)
Glucose-Capillary: 121 mg/dL — ABNORMAL HIGH (ref 70–99)
Glucose-Capillary: 195 mg/dL — ABNORMAL HIGH (ref 70–99)
Glucose-Capillary: 141 mg/dL — ABNORMAL HIGH (ref 70–99)
Glucose-Capillary: 134 mg/dL — ABNORMAL HIGH (ref 70–99)
Glucose-Capillary: 139 mg/dL — ABNORMAL HIGH (ref 70–99)

## 2020-10-10 LAB — BASIC METABOLIC PANEL
Anion gap: 10 (ref 5–15)
BUN: 50 mg/dL — ABNORMAL HIGH (ref 8–23)
CO2: 23 mmol/L (ref 22–32)
Calcium: 8 mg/dL — ABNORMAL LOW (ref 8.9–10.3)
Chloride: 111 mmol/L (ref 98–111)
Creatinine, Ser: 2.01 mg/dL — ABNORMAL HIGH (ref 0.61–1.24)
GFR, Estimated: 36 mL/min — ABNORMAL LOW (ref >60)
Glucose, Bld: 125 mg/dL — ABNORMAL HIGH (ref 70–99)
Potassium: 3.5 mmol/L (ref 3.5–5.1)
Sodium: 144 mmol/L (ref 135–145)

## 2020-10-10 MED ORDER — METOPROLOL TARTRATE 25 MG PO TABS
25.0000 mg | ORAL_TABLET | Freq: Four times a day (QID) | ORAL | Status: DC
  Administered 2020-10-10 – 2020-10-11 (×4): 25 mg via ORAL
  Filled 2020-10-10 (×4): qty 1

## 2020-10-10 NOTE — Progress Notes (Signed)
Nutrition Follow-up  DOCUMENTATION CODES:   Severe malnutrition in context of acute illness/injury  INTERVENTION:   Ensure Enlive po TID, each supplement provides 350 kcal and 20 grams of protein  Magic cup TID with meals, each supplement provides 290 kcal and 9 grams of protein  Rena-vit po daily   MVI po daily   NUTRITION DIAGNOSIS:   Severe Malnutrition related to acute illness as evidenced by 9 percent weight loss in <1 month, moderate fat depletions and mild to moderate muscle depletions. - new diagnosis   GOAL:   Patient will meet greater than or equal to 90% of their needs  -progressing   MONITOR:   PO intake,Supplement acceptance,Labs,Weight trends,Skin,I & O's  ASSESSMENT:   65 y/o male with h/o bladder cancer, DM, HTN and cholecystectomy 4/25 who is admitted with intrabdomial abscess, septic shock, ARF and bacteremia.   Pt s/p IR drain revision 5/19  Met with pt on 5/26 during SLP evaluation; pt was able to be advanced to a regular diet. Pt ate 20% of his dinner last night and 85% of his breakfast this morning. Pt seems to have increased oral intake with advancement of his diet. RD will change Nepro to Ensure Enlive as pt prefers chocolate flavor. Per chart, pt is down ~23lbs(9%) since admit; this is significant. Pt with new muscle and fat depletions that were not present on admission. Pt now meets criteria for severe acute malnutrition. Pt weak and deconditioned but has been making good improvements with therapy. Dialysis catheter has been removed. Drain with very little output. UOP 2845m. Pt continues to have diarrhea; however, diarrhea is less frequent (once per day). Plan is for rehab vs SNF.   Medications reviewed and include: colace, heparin, insulin, rena-vit, MVI, protonix, miralax, unasyn, 5% dextrose _0 /hr   Labs reviewed: Na 144 wnl, K 3.5 wnl, BUN 50(H), creat 2.01(H), Mg 1.9 wnl P 4.2 wnl- 5/26 Hgb 9.1(L), Hct 28.4(L) cbgs- 121, 195 x 24  hrs  Flowsheet Row Most Recent Value  Orbital Region No depletion  Upper Arm Region Moderate depletion  Thoracic and Lumbar Region No depletion  Buccal Region No depletion  Temple Region Mild depletion  Clavicle Bone Region Mild depletion  Clavicle and Acromion Bone Region Mild depletion  Scapular Bone Region No depletion  Dorsal Hand No depletion  Patellar Region Moderate depletion  Anterior Thigh Region Moderate depletion  Posterior Calf Region Moderate depletion  Edema (RD Assessment) None  Hair Reviewed  Eyes Reviewed  Mouth Reviewed  Skin Reviewed  Nails Reviewed     Diet Order:    Diet Order            Diet regular Room service appropriate? Yes with Assist; Fluid consistency: Thin  Diet effective now                EDUCATION NEEDS:   Education needs have been addressed  Skin:  Skin Assessment: Reviewed RN Assessment (ecchymosis)  Last BM:  5/27- type 7  Height:   Ht Readings from Last 1 Encounters:  09/30/20 6' 3.98" (1.93 m)    Weight:   Wt Readings from Last 1 Encounters:  10/09/20 102.9 kg    Ideal Body Weight:  91.8 kg  BMI:  Body mass index is 27.63 kg/m.  Estimated Nutritional Needs:   Kcal:  2700-3000kcal/day  Protein:  >130g/day  Fluid:  2.4-2.7L/day  CKoleen DistanceMS, RD, LDN Please refer to [facility] RD and/or RD on-call/weekend/after hours pager

## 2020-10-10 NOTE — Progress Notes (Signed)
Physical Therapy Treatment Patient Details Name: Richard Oconnell MRN: [id number] DOB: 01/17/1956 Today's Date: 10/10/2020    History of Present Illness 65 yo male with previous dx of acute cholecystitis s/p cholecystotomy tube 2/24 and then follow up robotic assisted cholecystectomy 4/25, now presents with large abdominal abscess with severe septic shock and BACTEREMIA, with multiorgan failure (liver and renal failure) complicated by severe metabolic acidosis and toxic metabolic encephalopathy.    PT Comments    Pt resting in bed upon PT arrival; pt agreeable to PT session.  Pt able to ambulate 15 feet with RW CGA to min assist x2; pt demonstrating slow ambulation with increased effort to take short steps limiting distance able to ambulate.  HR 70-75 bpm at rest and increased up to 125 bpm with ambulation (HR returned to 71-75 bpm within a few minutes of rest): nurse notified; pt noted to be in sinus rhythm per monitor end of session.  Pt demonstrating generalized weakness and decreased activity tolerance.  Will continue to focus on strengthening, balance, and progressive functional mobility per pt tolerance.    Follow Up Recommendations  SNF     Equipment Recommendations  Other (comment) (TBD at next venue of care)    Recommendations for Other Services       Precautions / Restrictions Precautions Precautions: Fall Precaution Comments: HOB >30 degrees Restrictions Weight Bearing Restrictions: No    Mobility  Bed Mobility Overal bed mobility: Needs Assistance Bed Mobility: Supine to Sit;Sit to Supine     Supine to sit: Min assist;Mod assist (assist for trunk; vc's for technique) Sit to supine: +2 for physical assistance (2 assist for safety sit to supine and to boost pt up in bed end of session using bed sheet)   General bed mobility comments: vc's for technique    Transfers Overall transfer level: Needs assistance Equipment used: Rolling walker (2 wheeled) Transfers:  Sit to/from Stand Sit to Stand: Min assist;Mod assist;+2 safety/equipment         General transfer comment: assist to initiate and come to full stand; vc's for UE/LE placement and overall technique; assist to control descent sitting  Ambulation/Gait Ambulation/Gait assistance: Min guard;Min assist;+2 physical assistance;+2 safety/equipment Gait Distance (Feet): 15 Feet Assistive device: Rolling walker (2 wheeled)   Gait velocity: decreased   General Gait Details: decreased B LE step length/foot clearance/heelstrike; increased effort to take short steps   Stairs             Wheelchair Mobility    Modified Rankin (Stroke Patients Only)       Balance Overall balance assessment: Needs assistance Sitting-balance support: Bilateral upper extremity supported;Feet supported Sitting balance-Leahy Scale: Poor Sitting balance - Comments: mild posterior lean intermittently requiring vc's and min assist to correct at times   Standing balance support: Bilateral upper extremity supported;During functional activity Standing balance-Leahy Scale: Poor Standing balance comment: pt requiring B UE support on RW for static standing balance; also vc's required for upright posture when initially standing                            Cognition Arousal/Alertness: Awake/alert Behavior During Therapy: WFL for tasks assessed/performed Overall Cognitive Status: No family/caregiver present to determine baseline cognitive functioning                                 General Comments: Oriented to person, place, and time  only.  Easily distracted.  Increased time to process 1-2 step commands.      Exercises      General Comments   Nursing cleared pt for participation in physical therapy.  Pt agreeable to PT session.      Pertinent Vitals/Pain Pain Assessment: No/denies pain Pain Intervention(s): Limited activity within patient's tolerance;Monitored during  session;Repositioned  O2 sats WFL on room air during sessions activities.    Home Living                      Prior Function            PT Goals (current goals can now be found in the care plan section) Acute Rehab PT Goals Patient Stated Goal: to improve walking PT Goal Formulation: With patient Progress towards PT goals: Progressing toward goals    Frequency    Min 2X/week      PT Plan Current plan remains appropriate    Co-evaluation              AM-PAC PT "6 Clicks" Mobility   Outcome Measure  Help needed turning from your back to your side while in a flat bed without using bedrails?: A Lot Help needed moving from lying on your back to sitting on the side of a flat bed without using bedrails?: A Lot Help needed moving to and from a bed to a chair (including a wheelchair)?: A Lot Help needed standing up from a chair using your arms (e.g., wheelchair or bedside chair)?: A Lot Help needed to walk in hospital room?: A Lot Help needed climbing 3-5 steps with a railing? : Total 6 Click Score: 11    End of Session Equipment Utilized During Treatment: Gait belt (gait belt up high away from drain) Activity Tolerance: Patient limited by fatigue Patient left: in bed;with call bell/phone within reach;with bed alarm set;Other (comment) Nurse Communication: Mobility status;Precautions (pt's HR during session) PT Visit Diagnosis: Unsteadiness on feet (R26.81);Muscle weakness (generalized) (M62.81);Difficulty in walking, not elsewhere classified (R26.2)     Time: 3903-0092 PT Time Calculation (min) (ACUTE ONLY): 30 min  Charges:  $Gait Training: 8-22 mins $Therapeutic Activity: 8-22 mins                    Leitha Bleak, PT 10/10/20, 4:02 PM

## 2020-10-10 NOTE — Progress Notes (Signed)
Progress Note  Patient Name: Richard Oconnell Date of Encounter: 10/10/2020  [facility] Cardiologist:   Subjective   No complaints this morning, denies shortness of breath, sitting in bed, comfortable eating No chest pain, denies tachycardia Telemetry reviewed, converted back to atrial fibrillation with RVR  Inpatient Medications    Scheduled Meds: . Chlorhexidine Gluconate Cloth  6 each Topical Daily  . docusate  100 mg Oral BID  . feeding supplement  237 mL Oral TID BM  . heparin injection (subcutaneous)  5,000 Units Subcutaneous Q8H  . insulin aspart  0-20 Units Subcutaneous TID WC  . insulin aspart  0-5 Units Subcutaneous QHS  . metoprolol tartrate  25 mg Oral Q6H  . multivitamin  1 tablet Oral QHS  . multivitamin with minerals  1 tablet Oral Daily  . pantoprazole (PROTONIX) IV  40 mg Intravenous Q24H  . polyethylene glycol  17 g Oral Daily  . sodium chloride flush  3 mL Intravenous Q12H   Continuous Infusions: . sodium chloride Stopped (10/09/20 1150)  . sodium chloride    . sodium chloride Stopped (10/04/20 1112)  . ampicillin-sulbactam (UNASYN) IV 3 g (10/10/20 0536)  . dextrose 5 % with kcl 100 mL/hr at 10/10/20 0241   PRN Meds: sodium chloride, acetaminophen, albuterol, docusate, LORazepam, melatonin, metoprolol tartrate, ondansetron (ZOFRAN) IV, polyethylene glycol, sodium chloride flush   Vital Signs    Vitals:   10/10/20 0300 10/10/20 0540 10/10/20 0747 10/10/20 1132  BP: 139/90 (!) 161/90 (!) 148/85 (!) 141/88  Pulse: 62 62 65 (!) 108  Resp: (!) 23 18    Temp:  98 F (36.7 C) 98.1 F (36.7 C) 98.7 F (37.1 C)  TempSrc:   Oral Oral  SpO2: 97% 97% 98% 96%  Weight:      Height:        Intake/Output Summary (Last 24 hours) at 10/10/2020 1352 Last data filed at 10/10/2020 1121 Gross per 24 hour  Intake 1645.84 ml  Output 3550 ml  Net -1904.16 ml   Last 3 Weights 10/09/2020 10/08/2020 10/07/2020  Weight (lbs) 226 lb 13.7 oz 239 lb 3.2 oz  240 lb 11.9 oz  Weight (kg) 102.9 kg 108.5 kg 109.2 kg      Telemetry    Atrial fibrillation starting this morning- Personally Reviewed  ECG     - Personally Reviewed  Physical Exam   GEN: No acute distress.   Neck: No JVD Cardiac:  Irregularly irregular,  no murmurs, rubs, or gallops.  Respiratory: Clear to auscultation bilaterally. GI: Soft, nontender, non-distended  MS: No edema; No deformity. Neuro:  Nonfocal  Psych: Normal affect   Labs    High Sensitivity Troponin:  No results for input(s): TROPONINIHS in the last 720 hours.    Chemistry Recent Labs  Lab 10/06/20 0419 10/07/20 0331 10/08/20 1010 10/09/20 0445 10/10/20 0644  NA 148* 147* 150* 149* 144  K 3.6 3.8 3.6 3.3* 3.5  CL 113* 111 115* 114* 111  CO2 25 24 24 23 23   GLUCOSE 210* 170* 119* 128* 125*  BUN 73* 68* 65* 59* 50*  CREATININE 2.79* 2.60* 2.36* 2.08* 2.01*  CALCIUM 8.0* 8.0* 8.3* 8.3* 8.0*  PROT 6.4* 6.8  --   --   --   ALBUMIN 2.3* 2.3*  2.3* 2.3* 2.4*  --   AST 67* 64*  --   --   --   ALT 62* 64*  --   --   --   ALKPHOS 156* 156*  --   --   --  BILITOT 1.2 1.2  --   --   --   GFRNONAA 24* 27* 30* 35* 36*  ANIONGAP 10 12 11 12 10      Hematology Recent Labs  Lab 10/08/20 1011 10/09/20 0445 10/10/20 0644  WBC 13.2* 10.3 8.1  RBC 3.53* 3.81* 3.33*  HGB 9.5* 10.3* 9.1*  HCT 30.2* 32.9* 28.4*  MCV 85.6 86.4 85.3  MCH 26.9 27.0 27.3  MCHC 31.5 31.3 32.0  RDW 18.2* 18.3* 17.9*  PLT 310 296 268    BNPNo results for input(s): BNP, PROBNP in the last 168 hours.   DDimer No results for input(s): DDIMER in the last 168 hours.   Radiology    No results found.  Cardiac Studies     Patient Profile     65 y.o.malewith a hx of HTN, DM2, bladder CA, previous admission with acute cholecystitis s/pcholecystotomy tube 2/24 with follow-up robotic assisted cholecystectomy 4/25, admitted 5/12 w/ septic shock, bacteremia, metabolic acidosis, multiorgan failure, and metabolic  encephalopathy in the setting of a large abdominal abscess, complicated by paroxysmal afib w/ rvr.  Amio associated w/ bradycardia and jxnl rhythm.  [place] avoided due to draining abscess, lft abnormalities.   Assessment & Plan    Septic shock present on admission--  large abscess intra-abdominal (culture growing Enterococcus faecalis and Bacteroides fragilis) robotic assisted cholecystectomy in April 2022 --on Unasyn and fluconazole -- dispose percutaneous drain placement by IR into gallbladder. Abscess on 09/25/2020 -- drain was exchanged by IR on 5/19  Paroxysmal atrial fibrillation  CHA2DS2-VASc score 3 Converting back to atrial fibrillation with RVR on trip to the bathroom rate up to 140 bpm, settling back in the low 100s at rest -Prior history of bradycardia, junctional rhythm when amiodarone and beta-blockers used in conjunction -He is asymptomatic --- We will attempt rate control with metoprolol 25 every 6 with hold parameters for low blood pressure -As he is likely going to nursing facility and will have assist going to bathroom, could consider initiating Eliquis    Abdominal abscess, sepsis  Enterococcal bacteremia  Followed by surgery and internal medicine    Essential hypertension  Amlodipine held, Will initiate metoprolol 25 every 6 hours  Chronic renal insufficiency creatinine up to 6.13---  started on hemodialysis, two treatments. Creatinine trending down 2.3   Total encounter time more than 35 minutes Greater than 50% was spent in counseling and coordination of care with the patient  For questions or updates, please contact [place] Please consult [email or web address] for contact info under        Signed, Ida Rogue, MD  10/10/2020, 1:52 PM

## 2020-10-10 NOTE — Care Management Important Message (Signed)
Important Message  Patient Details  Name: Richard Oconnell MRN: [id number] Date of Birth: [birth year]   Medicare Important Message Given:  Yes     Juliann Pulse A Kinslei Labine 10/10/2020, 12:42 PM

## 2020-10-10 NOTE — Progress Notes (Signed)
[facility] [place] NAME: Richard Oconnell    MR#:  [id number]  DATE OF BIRTH:  [birth year]  SUBJECTIVE:  Still mildly confused, but seems to be slowly improving. He knows he is in the hospital. He knows the month and year. He is aware of issues around his gall bladder and sepsis.  REVIEW OF SYSTEMS:   Review of Systems  Unable to perform ROS: Medical condition   Tolerating Diet: working with speech therapist Tolerating PT: PT to be started  DRUG ALLERGIES:   Allergies  Allergen Reactions  . Cyclobenzaprine Other (See Comments)    Not improving pain and makes him agressive.  . Lisinopril Cough    VITALS:  Blood pressure (!) 142/90, pulse 72, temperature 97.6 F (36.4 C), temperature source Oral, resp. rate 18, height 6' 3.98" (1.93 m), weight 102.9 kg, SpO2 96 %.  PHYSICAL EXAMINATION:   Physical Exam  General exam: Alert, awake, oriented x 3 Respiratory system: Clear to auscultation. Respiratory effort normal. Cardiovascular system:RRR. No murmurs, rubs, gallops. Gastrointestinal system: Abdomen is nondistended, soft and nontender. No organomegaly or masses felt. Normal bowel sounds heard. Central nervous system: Alert and oriented. No focal neurological deficits. Extremities: No C/C/E, +pedal pulses Skin: No rashes, lesions or ulcers Psychiatry: mildly confused   LABORATORY PANEL:  CBC Recent Labs  Lab 10/10/20 0644  WBC 8.1  HGB 9.1*  HCT 28.4*  PLT 268    Chemistries  Recent Labs  Lab 10/07/20 0331 10/08/20 0428 10/10/20 0644  NA 147*   < > 144  K 3.8   < > 3.5  CL 111   < > 111  CO2 24   < > 23  GLUCOSE 170*   < > 125*  BUN 68*   < > 50*  CREATININE 2.60*   < > 2.01*  CALCIUM 8.0*   < > 8.0*  MG 2.1   < > 1.9  AST 64*  --   --   ALT 64*  --   --   ALKPHOS 156*  --   --   BILITOT 1.2  --   --    < > = values in this interval not displayed.   Cardiac Enzymes No results for input(s): TROPONINI in the  last 168 hours. RADIOLOGY:  No results found. ASSESSMENT AND PLAN:   [name] is a 65 year old gentleman with history of hypertension, bladder cancer, diabetes type 2, acute cholecystitis with cholecystectomy tube placed July 10 2020, followed by robotic assisted cholecystectomy September 08, 2020, in the intensive care unit with septic shock, bacteremia, large abdominal abscess, multiorgan failure with complications of severe metabolic acidosis,  atrial fibrillation , acute renal failure with ATN from sepsis and started on hemodialysis.  Acute hypoxic respiratory failure in the setting of severe metabolic encephalopathy due to septic shock, acute kidney injury secondary to ATN in the setting of sepsis -- patient was intubated and remained on the ventilator for several days. -- Extubated on 09/30/20 now on room air -- PRN bronchodilators, pulmonary toilet.  Septic shock present on admission-- resolved source large abscess intra-abdominal (culture growing Enterococcus faecalis and Bacteroides fragilis) history of acute cholecystitis with cholecystostomy July 06, 2008 followed by robotic assisted cholecystectomy in April 2022 -- WBC now normal -- followed by ID. Currently on unasyn. He received 3 doses of IV fluconazole that completed on 5/23. Will follow up with ID regarding further antibiotic course -- s/p percutaneous drain placement by  IR into gallbladder. Abscess on 09/25/2020 -- drain was exchanged by IR on 5/19 -- was followed by general surgery, now signed off -will likely need follow up with IR regarding potential re-imaging and removal of drain  Acute kidney injury in the setting of sepsis due to ATN -- patient had creatinine up to 6.13--- started on hemodialysis. Got two treatments. -- Creatinine trending down 2.08-- Followed by nephrology. -- Good urine output. Dialysis on hold.  Acute metabolic encephalopathy/toxic in the setting of sepsis AK I -- remains  confused/delirious, likely metabolic from renal failure, electrolyte abnormalities and icu delirium -- MRI brain showed no acute abnormality. Chronic microvascular disease --he is starting to show some improvements  A fib with RVR -- followed by Dr. Rockey Situ [facility] -- 5/23-- was on Cardizem gtt for few hours--now off.  --developed rapid a fib again on 5/27 --started on metoprolol for rate control --can consider anticoagulation, once drain issues resolved  Anemia without sign symptoms of obvious bleeding secondary to chronic illness -- transfuse as needed  Nutrition --5/24-- seen by speech therapist. NG tube removed. Swallow eval done at bedside.  -Initially on dysphagia 1 with nectar thick liquids -overall improved and diet now advanced to regular diet with thin liquids  Hypernatremia -likely related to free water deficit -may be contributing to encephalopathy -improving with hypotonic fluids  Hypokalemia -replace   Family communication : no family present today Consults : nephrology, ID, cardiology, surgery CODE STATUS: full code DVT Prophylaxis : heparin subcu Level of care: Progressive Cardiac Status is: Inpatient  Remains inpatient appropriate because:Inpatient level of care appropriate due to severity of illness   Dispo: The patient is from: Home              Anticipated d/c is to: SNF              Patient currently is not medically stable to d/c.   Difficult to place patient No  Patient is critically ill with multiple comorbidities and medical issues. Slowly improving.     TOTAL TIME TAKING CARE OF THIS PATIENT: 35 minutes.  >50% time spent on counselling and coordination of care  Note: This dictation was prepared with Dragon dictation along with smaller phrase technology. Any transcriptional errors that result from this process are unintentional.  Kathie Dike M.D    [facility]   CC: Primary care physician; [name],  DOPatient ID: Richard Oconnell, male   DOB: [birth year], 65 y.o.   MRN: [id number]

## 2020-10-10 NOTE — Progress Notes (Signed)
Hospitalist notified of patients elevated bp. No response noted.

## 2020-10-10 NOTE — TOC Progression Note (Addendum)
Transition of Care [facility]) - Progression Note    Patient Details  Name: Richard Oconnell MRN: [id number] Date of Birth: 17-May-1956  Transition of Care [facility]) CM/SW [place], [place] Phone Number: 10/10/2020, 9:48 AM  Clinical Narrative:     Update 1:54 pm: CSW received call back from [facility] who reports they can reassess for bed availability Monday for patient, as no beds today.      CSW spoke with patient's daughter regarding bed offers, she reports they would be interested in a [facility] or [facility] inpatient rehab.   CSW explained typical hospital inpatient rehab qualifications and that patient may not meet requirements, however CSW has called [facility] and lvm with inpatient rehab to attempt to make referral.   CSW has sent referral in hub to [facility] and lvm with admissions, pending call back.   Expected Discharge Plan: [place] Barriers to Discharge: Continued Medical Work up  Expected Discharge Plan and Services Expected Discharge Plan: [place] In-house Referral: Clinical Social Work   Post Acute Care Choice: [place] Living arrangements for the past 2 months: Single Family Home                                       Social Determinants of Health (SDOH) Interventions    Readmission Risk Interventions No flowsheet data found.

## 2020-10-10 NOTE — Progress Notes (Signed)
Richard Oconnell, [place] 10/10/20  Subjective:   Hospital day # 15  Patient seen and evaluated at bedside. Transferred out ICU to regular room Confused continues Renal function continues to improve slowly Creatinine is 2 Urine output 2.8 L over 24 hours.   Renal: 05/26 0701 - 05/27 0700 In: 2068.6 [P.O.:70; I.V.:1767.5; IV Piggyback:231] Out: 2860 [Urine:2860] Lab Results  Component Value Date   CREATININE 2.01 (H) 10/10/2020   CREATININE 2.08 (H) 10/09/2020   CREATININE 2.36 (H) 10/08/2020      Objective:  Vital signs in last 24 hours:  Temp:  [98 F (36.7 C)-98.7 F (37.1 C)] 98.7 F (37.1 C) (05/27 1132) Pulse Rate:  [58-108] 108 (05/27 1132) Resp:  [17-30] 18 (05/27 0540) BP: (131-166)/(74-97) 141/88 (05/27 1132) SpO2:  [92 %-98 %] 96 % (05/27 1132)  Weight change:  Filed Weights   10/07/20 0434 10/08/20 0500 10/09/20 0500  Weight: 109.2 kg 108.5 kg 102.9 kg    Intake/Output:    Intake/Output Summary (Last 24 hours) at 10/10/2020 1432 Last data filed at 10/10/2020 1121 Gross per 24 hour  Intake 1503.34 ml  Output 3550 ml  Net -2046.66 ml    Physical Exam: General:  No acute distress, laying in the bed  HEENT  [place]/AT oral mucosa moist  Pulm/lungs  Coarse breath sounds , normal effort  CVS/Heart  regular rhythm, no rub or gallop  Abdomen:   Soft, nontender, BS present  Extremities:  trace peripheral edema  Neurologic:  Awake but confused  Skin:  No acute rashes  Foley in place  Basic Metabolic Panel:  Recent Labs  Lab 10/05/20 0410 10/06/20 0419 10/07/20 0331 10/08/20 0428 10/08/20 1010 10/09/20 0445 10/10/20 0644  NA 148* 148* 147*  --  150* 149* 144  K 3.2* 3.6 3.8  --  3.6 3.3* 3.5  CL 111 113* 111  --  115* 114* 111  CO2 26 25 24   --  24 23 23   GLUCOSE 209* 210* 170*  --  119* 128* 125*  BUN 76* 73* 68*  --  65* 59* 50*  CREATININE 3.40* 2.79* 2.60*  --  2.36* 2.08* 2.01*  CALCIUM 7.9* 8.0* 8.0*  --  8.3*  8.3* 8.0*  MG 2.0 2.1 2.1 2.1  --  2.1 1.9  PHOS 4.4 3.8 4.1  --  4.5 4.2  --      CBC: Recent Labs  Lab 10/05/20 0410 10/06/20 0419 10/07/20 0331 10/08/20 1011 10/09/20 0445 10/10/20 0644  WBC 13.3* 15.1* 14.8* 13.2* 10.3 8.1  NEUTROABS 9.8* 11.8*  --   --   --   --   HGB 10.5* 10.1* 10.0* 9.5* 10.3* 9.1*  HCT 32.0* 31.2* 32.2* 30.2* 32.9* 28.4*  MCV 82.9 83.6 85.9 85.6 86.4 85.3  PLT 316 302 320 310 296 268      Lab Results  Component Value Date   HEPBSAG NON REACTIVE 09/26/2020   HEPBSAB NON REACTIVE 09/26/2020   HEPBIGM NON REACTIVE 09/26/2020      Microbiology:  Recent Results (from the past 240 hour(s))  Culture, blood (single) w Reflex to ID Panel     Status: None   Collection Time: 09/30/20  3:35 PM   Specimen: BLOOD  Result Value Ref Range Status   Specimen Description BLOOD BLOOD RIGHT HAND  Final   Special Requests   Final    BOTTLES DRAWN AEROBIC AND ANAEROBIC Blood Culture adequate volume   Culture   Final    NO GROWTH  5 DAYS Performed at [facility], [address] [place], [place] [address]    Report Status 10/05/2020 FINAL  Final    Coagulation Studies: No results for input(s): LABPROT, INR in the last 72 hours.  Urinalysis: No results for input(s): COLORURINE, LABSPEC, PHURINE, GLUCOSEU, HGBUR, BILIRUBINUR, KETONESUR, PROTEINUR, UROBILINOGEN, NITRITE, LEUKOCYTESUR in the last 72 hours.  Invalid input(s): APPERANCEUR    Imaging: No results found.   Medications:   . sodium chloride Stopped (10/09/20 1150)  . sodium chloride    . sodium chloride Stopped (10/04/20 1112)  . ampicillin-sulbactam (UNASYN) IV 3 g (10/10/20 1412)  . dextrose 5 % with kcl 100 mL/hr at 10/10/20 1415   . Chlorhexidine Gluconate Cloth  6 each Topical Daily  . docusate  100 mg Oral BID  . feeding supplement  237 mL Oral TID BM  . heparin injection (subcutaneous)  5,000 Units Subcutaneous Q8H  . insulin aspart  0-20 Units Subcutaneous TID WC  .  insulin aspart  0-5 Units Subcutaneous QHS  . metoprolol tartrate  25 mg Oral Q6H  . multivitamin  1 tablet Oral QHS  . multivitamin with minerals  1 tablet Oral Daily  . pantoprazole (PROTONIX) IV  40 mg Intravenous Q24H  . polyethylene glycol  17 g Oral Daily  . sodium chloride flush  3 mL Intravenous Q12H   sodium chloride, acetaminophen, albuterol, docusate, LORazepam, melatonin, metoprolol tartrate, ondansetron (ZOFRAN) IV, polyethylene glycol, sodium chloride flush  Assessment/ Plan:  65 y.o. male with hypertension, diabetes mellitus type II, bladder cancer, admitted on 09/25/2020 for Septic shock ([place]) [A41.9, R65.21] Altered mental status, unspecified altered mental status type [R41.82] Postprocedural intraabdominal abscess [T81.43XA] Sepsis with acute renal failure and septic shock, due to unspecified organism, unspecified acute renal failure type ([place]) [A41.9, R65.21, N17.9]   # AKI  baseline creatinine of 1.1, GFR >60 on 08/19/20.  Had dialysis 5/16 and 5/18.   Lab Results  Component Value Date   CREATININE 2.01 (H) 10/10/2020   CREATININE 2.08 (H) 10/09/2020   CREATININE 2.36 (H) 10/08/2020   05/26 0701 - 05/27 0700 In: 2068.6 [P.O.:70; I.V.:1767.5; IV Piggyback:231] Out: 2860 [Urine:2860]  - Foley in place - IVF-D5 with K @ 100 to provide supplementation  # Sepsis, Gall bladder Abscess, S/p robotic assisted Lap Chole 09/08/2020 S/p CT guided GB abscess drain placement by VIR, on 5/12 Blood culture positive for Bacteroides and enterococcus on 5/12 -Treating with Unasyn and fluconazole.    # hyperphosphatemia - Phosphorus 4.2, within range Lab Results  Component Value Date   CALCIUM 8.0 (L) 10/10/2020   PHOS 4.2 10/09/2020     # Acute resp failure intubated 09/27/20-09/30/2020 Currently on room air  #Hypernatremia. Serum sodium now 144.  NG tube has been removed.  Patient advanced to regular diet, tolerating well    LOS: 15   5/27/20222:32  PM  [facility] [place], [place]

## 2020-10-10 NOTE — Progress Notes (Signed)
Pharmacy Antibiotic Note  Richard Oconnell is a 65 y.o. male admitted on 09/25/2020. Pharmacy has been consulted for ampicillin/sulbactam.  Patient with cholecystitis in February s/p perc drain placement then lap chole on 4/25. Surgical drain removed 5/3 and presents with sepsis.  IR placed drain 5/12 and repositioned 5/19 with increased output.  Blood culture and drain culture with E. Faecalis and bacteroides. Patient previously on piperacillin/tazobactam  Today, 10/10/2020 Day #15 antibiotics,  Fluconazole (completed)  5/17 repeat Blood cx are NGTD, cleared E. Faecalis and bacteroides  Plan: ID following. Continue ampicillin/sulbactam to 3gm IV q6h   Height: 6' 3.98" (193 cm) Weight: 102.9 kg (226 lb 13.7 oz) IBW/kg (Calculated) : 86.76  Temp (24hrs), Avg:98.4 F (36.9 C), Min:98 F (36.7 C), Max:98.7 F (37.1 C)  Recent Labs  Lab 10/06/20 0419 10/07/20 0331 10/08/20 1010 10/08/20 1011 10/09/20 0445 10/10/20 0644  WBC 15.1* 14.8*  --  13.2* 10.3 8.1  CREATININE 2.79* 2.60* 2.36*  --  2.08* 2.01*    Estimated Creatinine Clearance: 45 mL/min (A) (by C-G formula based on SCr of 2.01 mg/dL (H)).    Allergies  Allergen Reactions  . Cyclobenzaprine Other (See Comments)    Not improving pain and makes him agressive.  . Lisinopril Cough    Antimicrobials this admission: Zosyn 5/12 >> 5/19 Amp/sulb 5/19 >>   Microbiology results: 5/12 BCID/Bcx: E faecalis and B frag 5/12 abscess culture: E.faecalis, B frag 5/12 MRSA PCR: negative  Thank you for allowing pharmacy to be a part of this patient's care.  Eleonore Chiquito, PharmD, BCPS.   Work Cell: [phone] 10/10/2020 12:23 PM

## 2020-10-11 DIAGNOSIS — T8149XA Infection following a procedure, other surgical site, initial encounter: Secondary | ICD-10-CM | POA: Diagnosis not present

## 2020-10-11 DIAGNOSIS — J9601 Acute respiratory failure with hypoxia: Secondary | ICD-10-CM | POA: Diagnosis not present

## 2020-10-11 DIAGNOSIS — N170 Acute kidney failure with tubular necrosis: Secondary | ICD-10-CM | POA: Diagnosis not present

## 2020-10-11 DIAGNOSIS — I480 Paroxysmal atrial fibrillation: Secondary | ICD-10-CM | POA: Diagnosis not present

## 2020-10-11 LAB — BASIC METABOLIC PANEL
Anion gap: 10 (ref 5–15)
BUN: 40 mg/dL — ABNORMAL HIGH (ref 8–23)
CO2: 23 mmol/L (ref 22–32)
Calcium: 8.3 mg/dL — ABNORMAL LOW (ref 8.9–10.3)
Chloride: 111 mmol/L (ref 98–111)
Creatinine, Ser: 1.97 mg/dL — ABNORMAL HIGH (ref 0.61–1.24)
GFR, Estimated: 37 mL/min — ABNORMAL LOW (ref >60)
Glucose, Bld: 102 mg/dL — ABNORMAL HIGH (ref 70–99)
Potassium: 3.3 mmol/L — ABNORMAL LOW (ref 3.5–5.1)
Sodium: 144 mmol/L (ref 135–145)

## 2020-10-11 LAB — GLUCOSE, CAPILLARY
Glucose-Capillary: 113 mg/dL — ABNORMAL HIGH (ref 70–99)
Glucose-Capillary: 150 mg/dL — ABNORMAL HIGH (ref 70–99)
Glucose-Capillary: 128 mg/dL — ABNORMAL HIGH (ref 70–99)
Glucose-Capillary: 114 mg/dL — ABNORMAL HIGH (ref 70–99)

## 2020-10-11 LAB — MAGNESIUM: Magnesium: 1.9 mg/dL (ref 1.7–2.4)

## 2020-10-11 LAB — CBC
HCT: 29.6 % — ABNORMAL LOW (ref 39.0–52.0)
Hemoglobin: 9.4 g/dL — ABNORMAL LOW (ref 13.0–17.0)
MCH: 27 pg (ref 26.0–34.0)
MCHC: 31.8 g/dL (ref 30.0–36.0)
MCV: 85.1 fL (ref 80.0–100.0)
Platelets: 272 10*3/uL (ref 150–400)
RBC: 3.48 MIL/uL — ABNORMAL LOW (ref 4.22–5.81)
RDW: 18.2 % — ABNORMAL HIGH (ref 11.5–15.5)
WBC: 6.9 10*3/uL (ref 4.0–10.5)
nRBC: 0 % (ref 0.0–0.2)

## 2020-10-11 LAB — HEPARIN LEVEL (UNFRACTIONATED): Heparin Unfractionated: 0.5 IU/mL (ref 0.30–0.70)

## 2020-10-11 MED ORDER — METOPROLOL TARTRATE 25 MG PO TABS
25.0000 mg | ORAL_TABLET | Freq: Two times a day (BID) | ORAL | Status: DC
  Administered 2020-10-11 – 2020-10-15 (×8): 25 mg via ORAL
  Filled 2020-10-11 (×8): qty 1

## 2020-10-11 MED ORDER — HEPARIN BOLUS VIA INFUSION
5400.0000 [IU] | Freq: Once | INTRAVENOUS | Status: AC
  Administered 2020-10-11: 5400 [IU] via INTRAVENOUS
  Filled 2020-10-11: qty 5400

## 2020-10-11 MED ORDER — HEPARIN (PORCINE) 25000 UT/250ML-% IV SOLN
1800.0000 [IU]/h | INTRAVENOUS | Status: DC
  Administered 2020-10-11 – 2020-10-12 (×3): 1600 [IU]/h via INTRAVENOUS
  Administered 2020-10-13 – 2020-10-14 (×3): 1800 [IU]/h via INTRAVENOUS
  Filled 2020-10-11 (×6): qty 250

## 2020-10-11 MED ORDER — POTASSIUM CHLORIDE CRYS ER 20 MEQ PO TBCR
40.0000 meq | EXTENDED_RELEASE_TABLET | Freq: Once | ORAL | Status: AC
  Administered 2020-10-11: 40 meq via ORAL
  Filled 2020-10-11: qty 2

## 2020-10-11 MED ORDER — AMLODIPINE BESYLATE 5 MG PO TABS
5.0000 mg | ORAL_TABLET | Freq: Every day | ORAL | Status: DC
  Administered 2020-10-11 – 2020-10-13 (×4): 5 mg via ORAL
  Filled 2020-10-11 (×3): qty 1

## 2020-10-11 NOTE — Progress Notes (Signed)
[place] for heparin infusion  Indication: atrial fibrillation  Allergies  Allergen Reactions  . Cyclobenzaprine Other (See Comments)    Not improving pain and makes him agressive.  . Lisinopril Cough    Patient Measurements: Height: 6' 3.98" (193 cm) Weight: 108 kg (238 lb 3.2 oz) IBW/kg (Calculated) : 86.76  Vital Signs: Temp: 98.8 F (37.1 C) (05/28 1217) Temp Source: Oral (05/28 1217) BP: 144/95 (05/28 1217) Pulse Rate: 60 (05/28 1217)  Labs: Recent Labs    10/09/20 0445 10/10/20 0644 10/11/20 0552  HGB 10.3* 9.1* 9.4*  HCT 32.9* 28.4* 29.6*  PLT 296 268 272  CREATININE 2.08* 2.01* 1.97*    Estimated Creatinine Clearance: 50.4 mL/min (A) (by C-G formula based on SCr of 1.97 mg/dL (H)).   Medical History: Past Medical History:  Diagnosis Date  . Cancer [facility])    Bladder Cancer  . Erectile dysfunction associated with type 2 diabetes mellitus ([place]) 05/26/2016  . Gross hematuria 11/16/2016  . Hypertension 06/11/2015  . Type 2 diabetes mellitus with diabetic polyneuropathy, without long-term current use of insulin ([place]) 06/11/2015     Assessment: 65 yo male  with history of hypertension, bladder cancer, diabetes type 2, acute cholecystitis with cholecystectomy tube placed July 10 2020, followed by robotic assisted cholecystectomy September 08, 2020, in the intensive care unit with septic shock, bacteremia, large abdominal abscess, multiorgan failure with complications of severe metabolic acidosis,  atrial fibrillation, acute renal failure with ATN from sepsis and started on hemodialysis. Pt was previously on SQ heparin for DVT ppx. Pharmacy has been consulted for heparin dosing and monitoring until Eliquis can be started.    Goal of Therapy:  Heparin level 0.3-0.7 units/ml Monitor platelets by anticoagulation protocol: Yes   Plan:   Give 5400 units bolus x 1  Start heparin infusion at 1600 units/hr  Check anti-Xa level  in 6 hours and daily while on heparin  Continue to monitor H&H and platelets  Sherilyn Banker, PharmD Pharmacy Resident  10/11/2020 2:31 PM

## 2020-10-11 NOTE — Progress Notes (Addendum)
Speech Language Pathology Treatment: Dysphagia  Patient Details Name: Richard Oconnell MRN: [id number] DOB: Mar 10, 1961 Today's Date: 10/11/2020 Time: 6286-3817 SLP Time Calculation (min) (ACUTE ONLY): 30 min  Assessment / Plan / Recommendation Clinical Impression  Pt seen for ongoing assessment of swallowing. He appears to improve some each day; verbally responsive and able to follow instructions w/ min cues. He remains Confused, Tangential in thoughts/speech but is redirectable w/ verbal cues. He is much more Oriented even to his situation but does have reduced Insight into his situation/needs moving forward. Pt is easily Distracted if others are in the room which impacts his focus on eating his meal d/t his Increased Talking. Pt is on RA; wbc wnl.  Pt explained general aspiration precautions and agreed verbally to the need for following them especially sitting upright for all oral intake. Pt consumed trials of thin liquids via Cup, purees and soft solids. No overt clinical s/s of aspiration were noted w/ any consistency; respiratory status remained calm and unlabored, vocal quality clear b/t trials. Pt fed self during the meal when he was not talking. He followed instructions for small sips and bites. Oral phase appeared [facility] for bolus management and timely A-P transfer for swallowing; oral clearing achieved w/ all consistencies including solids.  Recommend continue a Regular diet w/ Cut meats/gravies added to moisten foods -- for ease of food preferences; Thin liquids. Recommend general aspiration precautions; Pills in Puree if needed; tray setup and positioning assistance and reducing distractions/people at meals. ST services will sign off at this time for swallowing/dysphagia - NSG to reconsult if any new needs while admitted. NSG updated. Precautions posted at bedside.   Re: Cognitive status -- assessment needs can be met at next venue of care once pt is established at his structured  setting/environment. Formal assessment is warranted d/t pt's Cognitive deficits currently presenting: decreased attention/awareness, inconsistent follow through w/ tasks w/out cues, and Tangential speech, in order to assess Safety awareness as well as higher level communication skills in ADLs and to lessen caregiver burden upon discharge. MD updated.     HPI HPI: Pt is a 65 year old man past medical history of hypertension, diabetes, and bladder cancer who was hospitalized with acute cholecystitis-several months ago 2/24  He was temporized with a percutaneous cholecystostomy tube.          Follow up  cholangiogram via the tube demonstrates that the cystic duct is still obstructed  He was offered him a robot-assisted laparoscopic cholecystectomy, but  deferred this procedure   On 4/25 recently had cholecystectomy along with significant lysis of adhesions on 4/25 here at [facility]. He presents today with somnolence and confusion. Family reports that patient is disoriented the day before yesterday, after which his skin has begun to appear increasingly yellow, +discomfort in the right upper quadrant of his abdomen, liver failure, renal failure, severe acidosis.    Pt is s/p cholecystectomy along with significant lysis of adhesions. He is admitted to ICU for large abd abscess in the gallbladder fossa, jaundice and septic shock, on pressors.  Of Note, pt was orally intubated 09/27/2020-09/30/2020; NG during same and removed 10/07/2020.      SLP Plan  All goals met       Recommendations  Diet recommendations: Regular;Thin liquid (meats cut; gravies to moisten) Liquids provided via: Cup;No straw Medication Administration: Whole meds with puree (as needed for safer swallowing) Supervision: Patient able to self feed;Intermittent supervision to cue for compensatory strategies (as needed) Compensations: Minimize environmental distractions;Slow rate;Small sips/bites;Lingual  sweep for clearance of pocketing;Follow solids  with liquid Postural Changes and/or Swallow Maneuvers: Out of bed for meals;Seated upright 90 degrees;Upright 30-60 min after meal                General recommendations:  (Dietician following for support) Oral Care Recommendations: Oral care BID;Oral care before and after PO;Patient independent with oral care (monitor) Follow up Recommendations: None (for swallowing) SLP Visit Diagnosis: Dysphagia, unspecified (R13.10) Plan: All goals met       GO                 [name], MS, CCC-SLP Speech Language Pathologist Rehab Services [phone] [facility] 10/11/2020, 1:44 PM

## 2020-10-11 NOTE — Progress Notes (Signed)
Richard Oconnell for heparin infusion  Indication: atrial fibrillation  Allergies  Allergen Reactions  . Cyclobenzaprine Other (See Comments)    Not improving pain and makes him agressive.  . Lisinopril Cough    Patient Measurements: Height: 6' 3.98" (193 cm) Weight: 108 kg (238 lb 3.2 oz) IBW/kg (Calculated) : 86.76  Vital Signs: Temp: 97.8 F (36.6 C) (05/28 2023) Temp Source: Oral (05/28 1217) BP: 150/94 (05/28 2023) Pulse Rate: 60 (05/28 2023)  Labs: Recent Labs    10/09/20 0445 10/10/20 0644 10/11/20 0552 10/11/20 2102  HGB 10.3* 9.1* 9.4*  --   HCT 32.9* 28.4* 29.6*  --   PLT 296 268 272  --   HEPARINUNFRC  --   --   --  0.50  CREATININE 2.08* 2.01* 1.97*  --     Estimated Creatinine Clearance: 50.4 mL/min (A) (by C-G formula based on SCr of 1.97 mg/dL (H)).   Medical History: Past Medical History:  Diagnosis Date  . Cancer [facility])    Bladder Cancer  . Erectile dysfunction associated with type 2 diabetes mellitus ([place]) 05/26/2016  . Gross hematuria 11/16/2016  . Hypertension 06/11/2015  . Type 2 diabetes mellitus with diabetic polyneuropathy, without long-term current use of insulin ([place]) 06/11/2015     Assessment: 65 yo male  with history of hypertension, bladder cancer, diabetes type 2, acute cholecystitis with cholecystectomy tube placed July 10 2020, followed by robotic assisted cholecystectomy September 08, 2020, in the intensive care unit with septic shock, bacteremia, large abdominal abscess, multiorgan failure with complications of severe metabolic acidosis,  atrial fibrillation, acute renal failure with ATN from sepsis and started on hemodialysis. Pt was previously on SQ heparin for DVT ppx. Pharmacy has been consulted for heparin dosing and monitoring until Eliquis can be started.    Date Time aPTT/HL Rate/Comment 5/28 2102 0.5  Therapeutic x1; 1600 un/hr  Baseline Labs: aPTT - 38s Hgb - 10>9.4 Plts - 562 on 5/15;  373-379 on 5/17-19; 316-302 on 5/20-22; 296-272 on 5/26-28  Goal of Therapy:  Heparin level 0.3-0.7 units/ml Monitor platelets by anticoagulation protocol: Yes   Plan:   Anti-xa therapeutic x1; continue heparin infusion at 1600 units/hr  Confirm anti-Xa level in 6 hours and daily while on heparin  Continue to monitor H&H and platelets  Lorna Dibble, PharmD 10/11/2020 10:20 PM

## 2020-10-11 NOTE — Progress Notes (Signed)
[place] [facility] [place] NAME: [name]    MR#:  [id number]  DATE OF BIRTH:  Jun 08, 1955  SUBJECTIVE:  Confusion improving. Does not have any abdominal pain or shortness of breath. Reports having several bowel movements overnight  REVIEW OF SYSTEMS:   Review of Systems  Unable to perform ROS: Medical condition   Tolerating Diet: working with speech therapist Tolerating PT: PT to be started  DRUG ALLERGIES:   Allergies  Allergen Reactions  . Cyclobenzaprine Other (See Comments)    Not improving pain and makes him agressive.  . Lisinopril Cough    VITALS:  Blood pressure (!) 147/85, pulse 70, temperature 98.1 F (36.7 C), resp. rate 18, height 6' 3.98" (1.93 m), weight 108 kg, SpO2 97 %.  PHYSICAL EXAMINATION:   Physical Exam  General exam: Alert, awake, no distress Respiratory system: Clear to auscultation. Respiratory effort normal. Cardiovascular system:RRR. No murmurs, rubs, gallops. Gastrointestinal system: Abdomen is nondistended, soft and nontender. No organomegaly or masses felt. Normal bowel sounds heard. Drain in [employer] nervous system: No focal neurological deficits. Extremities: No C/C/E, +pedal pulses Skin: No rashes, lesions or ulcers Psychiatry: still mildly confused, but improving     LABORATORY PANEL:  CBC Recent Labs  Lab 10/11/20 0552  WBC 6.9  HGB 9.4*  HCT 29.6*  PLT 272    Chemistries  Recent Labs  Lab 10/07/20 0331 10/08/20 0428 10/11/20 0552  NA 147*   < > 144  K 3.8   < > 3.3*  CL 111   < > 111  CO2 24   < > 23  GLUCOSE 170*   < > 102*  BUN 68*   < > 40*  CREATININE 2.60*   < > 1.97*  CALCIUM 8.0*   < > 8.3*  MG 2.1   < > 1.9  AST 64*  --   --   ALT 64*  --   --   ALKPHOS 156*  --   --   BILITOT 1.2  --   --    < > = values in this interval not displayed.   Cardiac Enzymes No results for input(s): TROPONINI in the last 168 hours. RADIOLOGY:  No results  found. ASSESSMENT AND PLAN:   Mr. Kayman Snuffer is a 65 year old gentleman with history of hypertension, bladder cancer, diabetes type 2, acute cholecystitis with cholecystectomy tube placed July 10 2020, followed by robotic assisted cholecystectomy September 08, 2020, in the intensive care unit with septic shock, bacteremia, large abdominal abscess, multiorgan failure with complications of severe metabolic acidosis,  atrial fibrillation , acute renal failure with ATN from sepsis and started on hemodialysis.  Acute hypoxic respiratory failure in the setting of severe metabolic encephalopathy due to septic shock, acute kidney injury secondary to ATN in the setting of sepsis -- patient was intubated and remained on the ventilator for several days. -- Extubated on 09/30/20 now on room air -- PRN bronchodilators, pulmonary toilet.  Septic shock present on admission-- resolved source large abscess intra-abdominal (culture growing Enterococcus faecalis and Bacteroides fragilis) history of acute cholecystitis with cholecystostomy July 06, 2008 followed by robotic assisted cholecystectomy in April 2022 -- WBC now normal -- followed by ID. Currently on unasyn. He received 3 doses of IV fluconazole that completed on 5/23. Will follow up with ID regarding further antibiotic course -- s/p percutaneous drain placement by IR into gallbladder. Abscess on 09/25/2020 -- drain was exchanged by IR on 5/19 --  was followed by general surgery, now signed off -will likely need follow up with IR regarding potential re-imaging and removal of drain  Acute kidney injury in the setting of sepsis due to ATN -- patient had creatinine up to 6.13--- started on hemodialysis. Got two treatments. -- Creatinine trending down 1.97-- Followed by nephrology. -- Good urine output. Dialysis on hold.  Acute metabolic encephalopathy/toxic in the setting of sepsis AK I -- remains confused/delirious, likely metabolic from renal  failure, electrolyte abnormalities and icu delirium -- MRI brain showed no acute abnormality. Chronic microvascular disease --he is starting to show some improvements  A fib with RVR -- followed by Dr. Rockey Situ [facility] -- 5/23-- was on Cardizem gtt for few hours--now off.  --developed rapid a fib again on 5/27 --metoprolol being adjusted for rate control --started on IV heparin for anticoagulation with plans to transition to eliquis once RUQ drain has been addressed  Anemia without sign symptoms of obvious bleeding secondary to chronic illness -- transfuse as needed  Nutrition --5/24-- seen by speech therapist. NG tube removed. Swallow eval done at bedside.  -Initially on dysphagia 1 with nectar thick liquids -overall improved and diet now advanced to regular diet with thin liquids  Hypernatremia -likely related to free water deficit -may be contributing to encephalopathy -improving with hypotonic fluids  Hypokalemia -replace   Family communication : discussed with brother and sister at the bedside Consults : nephrology, ID, cardiology, surgery CODE STATUS: full code DVT Prophylaxis : heparin subcu Level of care: Progressive Cardiac Status is: Inpatient  Remains inpatient appropriate because:Inpatient level of care appropriate due to severity of illness   Dispo: The patient is from: Home              Anticipated d/c is to: SNF              Patient currently is not medically stable to d/c.   Difficult to place patient No  Patient is critically ill with multiple comorbidities and medical issues. Slowly improving.     TOTAL TIME TAKING CARE OF THIS PATIENT: 35 minutes.  >50% time spent on counselling and coordination of care  Note: This dictation was prepared with Dragon dictation along with smaller phrase technology. Any transcriptional errors that result from this process are unintentional.  Kathie Dike M.D    [facility]   CC: Primary care  physician; [name], DOPatient ID: [name], male   DOB: 07/30/1955, 65 y.o.   MRN: [id number]

## 2020-10-11 NOTE — Progress Notes (Signed)
Progress Note  Patient Name: Richard Oconnell Date of Encounter: 10/11/2020  [facility] Cardiologist:   Subjective   On rounds this morning, family in the room, discussed with hospitalist service Atrial fibrillation with RVR starting yesterday 6 AM, reinitiated on metoprolol with conversion back to normal sinus rhythm 1:30 PM Since then with predominantly sinus rhythm, short runs junctional escape, asymptomatic, stable blood pressure  No complaints this morning Nurses report he is confused, some bowel and bladder incontinence  Inpatient Medications    Scheduled Meds: . Chlorhexidine Gluconate Cloth  6 each Topical Daily  . docusate  100 mg Oral BID  . feeding supplement  237 mL Oral TID BM  . heparin injection (subcutaneous)  5,000 Units Subcutaneous Q8H  . insulin aspart  0-20 Units Subcutaneous TID WC  . insulin aspart  0-5 Units Subcutaneous QHS  . metoprolol tartrate  25 mg Oral Q6H  . multivitamin  1 tablet Oral QHS  . multivitamin with minerals  1 tablet Oral Daily  . pantoprazole (PROTONIX) IV  40 mg Intravenous Q24H  . polyethylene glycol  17 g Oral Daily  . sodium chloride flush  3 mL Intravenous Q12H   Continuous Infusions: . sodium chloride Stopped (10/09/20 1150)  . sodium chloride    . sodium chloride Stopped (10/04/20 1112)  . ampicillin-sulbactam (UNASYN) IV 3 g (10/11/20 1243)  . dextrose 5 % with kcl 100 mL/hr at 10/11/20 0208   PRN Meds: sodium chloride, acetaminophen, albuterol, docusate, LORazepam, melatonin, metoprolol tartrate, ondansetron (ZOFRAN) IV, polyethylene glycol, sodium chloride flush   Vital Signs    Vitals:   10/11/20 0900 10/11/20 1000 10/11/20 1100 10/11/20 1217  BP:    (!) 144/95  Pulse:    60  Resp: 16 17 15 19   Temp:    98.8 F (37.1 C)  TempSrc:    Oral  SpO2:    96%  Weight:      Height:        Intake/Output Summary (Last 24 hours) at 10/11/2020 1413 Last data filed at 10/11/2020 6333 Gross per 24 hour   Intake 0 ml  Output 1100 ml  Net -1100 ml   Last 3 Weights 10/11/2020 10/10/2020 10/09/2020  Weight (lbs) 238 lb 3.2 oz 240 lb 11.9 oz 226 lb 13.7 oz  Weight (kg) 108.047 kg 109.2 kg 102.9 kg      Telemetry    Atrial fibrillation starting this morning- Personally Reviewed  ECG     - Personally Reviewed  Physical Exam   Constitutional: Alert, mild confusion no distress.  HENT:  Head: Grossly normal Eyes:  no discharge. No scleral icterus.  Neck: No JVD, no carotid bruits  Cardiovascular: Regular rate and rhythm, no murmurs appreciated Pulmonary/Chest: Clear to auscultation bilaterally, no wheezes or rails Abdominal: Soft.  no distension.  no tenderness.  Musculoskeletal: Normal range of motion Neurological:  normal muscle tone. Coordination normal. No atrophy Skin: Skin warm and dry Psychiatric: normal affect, pleasant   Labs    High Sensitivity Troponin:  No results for input(s): TROPONINIHS in the last 720 hours.    Chemistry Recent Labs  Lab 10/06/20 0419 10/07/20 0331 10/08/20 1010 10/09/20 0445 10/10/20 0644 10/11/20 0552  NA 148* 147* 150* 149* 144 144  K 3.6 3.8 3.6 3.3* 3.5 3.3*  CL 113* 111 115* 114* 111 111  CO2 25 24 24 23 23 23   GLUCOSE 210* 170* 119* 128* 125* 102*  BUN 73* 68* 65* 59* 50* 40*  CREATININE  2.79* 2.60* 2.36* 2.08* 2.01* 1.97*  CALCIUM 8.0* 8.0* 8.3* 8.3* 8.0* 8.3*  PROT 6.4* 6.8  --   --   --   --   ALBUMIN 2.3* 2.3*  2.3* 2.3* 2.4*  --   --   AST 67* 64*  --   --   --   --   ALT 62* 64*  --   --   --   --   ALKPHOS 156* 156*  --   --   --   --   BILITOT 1.2 1.2  --   --   --   --   GFRNONAA 24* 27* 30* 35* 36* 37*  ANIONGAP 10 12 11 12 10 10      Hematology Recent Labs  Lab 10/09/20 0445 10/10/20 0644 10/11/20 0552  WBC 10.3 8.1 6.9  RBC 3.81* 3.33* 3.48*  HGB 10.3* 9.1* 9.4*  HCT 32.9* 28.4* 29.6*  MCV 86.4 85.3 85.1  MCH 27.0 27.3 27.0  MCHC 31.3 32.0 31.8  RDW 18.3* 17.9* 18.2*  PLT 296 268 272    BNPNo  results for input(s): BNP, PROBNP in the last 168 hours.   DDimer No results for input(s): DDIMER in the last 168 hours.   Radiology    No results found.  Cardiac Studies     Patient Profile     65 y.o.malewith a hx of HTN, DM2, bladder CA, previous admission with acute cholecystitis s/pcholecystotomy tube 2/24 with follow-up robotic assisted cholecystectomy 4/25, admitted 5/12 w/ septic shock, bacteremia, metabolic acidosis, multiorgan failure, and metabolic encephalopathy in the setting of a large abdominal abscess, complicated by paroxysmal afib w/ rvr.  Amio associated w/ bradycardia and jxnl rhythm.  [place] avoided due to draining abscess, lft abnormalities.   Assessment & Plan    Septic shock present on admission--  large abscess intra-abdominal (culture growing Enterococcus faecalis and Bacteroides fragilis) robotic assisted cholecystectomy in April 2022 --on Unasyn and fluconazole -- dispose percutaneous drain placement by IR into gallbladder. Abscess on 09/25/2020 -- drain was exchanged by IR on 5/19 Plan for reimaging for drain pull  Paroxysmal atrial fibrillation  CHA2DS2-VASc score 3 Converting back to atrial fibrillation with RVR on trip to the bathroom rate up to 140 bpm, settling back in the low 100s at rest This admission with waxing waning bradycardia, junctional rhythm paroxysmal atrial fibrillation  -Amiodarone held given junctional rhythm at times -Metoprolol reinitiated yesterday for atrial fibrillation with RVR, -Received metoprolol 11 PM, 5 AM, but the 11 AM dose this morning not given presumably for bradycardia -We will adjust metoprolol dose to  twice daily with hold parameters from every 6 hours ----Heparin infusion until Eliquis can be started    Abdominal abscess, sepsis  Enterococcal bacteremia  Followed by surgery and internal medicine  Reimaging planned,   Essential hypertension  Metoprolol adjusted to twice daily dosing from every 6 We will  reinitiate amlodipine 5 daily  Chronic renal insufficiency creatinine up to 6.13---  started on hemodialysis, two treatments. Creatinine trending down, now 1.97   Total encounter time more than 35 minutes Greater than 50% was spent in counseling and coordination of care with the patient  For questions or updates, please contact [place] Please consult [email or web address] for contact info under        Signed, Ida Rogue, MD  10/11/2020, 2:13 PM

## 2020-10-11 NOTE — Progress Notes (Signed)
[facility]  PROGRESS NOTE   Subjective:   Patient is seen at bedside.  Out of bed to chair and is not in any distress. Family is at bedside.  There is still some confusion.  Objective:  Vital signs in last 24 hours:  Temp:  [97.6 F (36.4 C)-98.8 F (37.1 C)] 98.8 F (37.1 C) (05/28 1217) Pulse Rate:  [60-72] 60 (05/28 1217) Resp:  [15-26] 19 (05/28 1217) BP: (142-155)/(79-95) 144/95 (05/28 1217) SpO2:  [96 %-99 %] 96 % (05/28 1217) Weight:  [482 kg] 108 kg (05/28 0450)  Weight change: -1.153 kg Filed Weights   10/09/20 0500 10/10/20 0500 10/11/20 0450  Weight: 102.9 kg 109.2 kg 108 kg    Intake/Output: I/O last 3 completed shifts: In: 1240 [P.O.:240; I.V.:900; IV Piggyback:100] Out: 4150 [Urine:4150]   Intake/Output this shift:  Total I/O In: -  Out: 200 [Urine:200]  Physical Exam: General:  No acute distress  Head:  Normocephalic, atraumatic. Moist oral mucosal membranes  Eyes:  Anicteric  Neck:  Supple  Lungs:   Clear to auscultation, normal effort  Heart:  S1S2 no rubs  Abdomen:   Soft, nontender, bowel sounds present  Extremities:  peripheral edema.  Neurologic:  Awake, alert, following commands  Skin:  No lesions  Access:     Basic Metabolic Panel: Recent Labs  Lab 10/05/20 0410 10/06/20 0419 10/07/20 0331 10/08/20 0428 10/08/20 1010 10/09/20 0445 10/10/20 0644 10/11/20 0552  NA 148* 148* 147*  --  150* 149* 144 144  K 3.2* 3.6 3.8  --  3.6 3.3* 3.5 3.3*  CL 111 113* 111  --  115* 114* 111 111  CO2 26 25 24   --  24 23 23 23   GLUCOSE 209* 210* 170*  --  119* 128* 125* 102*  BUN 76* 73* 68*  --  65* 59* 50* 40*  CREATININE 3.40* 2.79* 2.60*  --  2.36* 2.08* 2.01* 1.97*  CALCIUM 7.9* 8.0* 8.0*  --  8.3* 8.3* 8.0* 8.3*  MG 2.0 2.1 2.1 2.1  --  2.1 1.9 1.9  PHOS 4.4 3.8 4.1  --  4.5 4.2  --   --     Liver Function Tests: Recent Labs  Lab 10/05/20 0410 10/06/20 0419 10/07/20 0331 10/08/20 1010 10/09/20 0445  AST  --  67*  64*  --   --   ALT  --  62* 64*  --   --   ALKPHOS  --  156* 156*  --   --   BILITOT  --  1.2 1.2  --   --   PROT  --  6.4* 6.8  --   --   ALBUMIN 2.0* 2.3* 2.3*  2.3* 2.3* 2.4*   No results for input(s): LIPASE, AMYLASE in the last 168 hours. No results for input(s): AMMONIA in the last 168 hours.  CBC: Recent Labs  Lab 10/05/20 0410 10/06/20 0419 10/07/20 0331 10/08/20 1011 10/09/20 0445 10/10/20 0644 10/11/20 0552  WBC 13.3* 15.1* 14.8* 13.2* 10.3 8.1 6.9  NEUTROABS 9.8* 11.8*  --   --   --   --   --   HGB 10.5* 10.1* 10.0* 9.5* 10.3* 9.1* 9.4*  HCT 32.0* 31.2* 32.2* 30.2* 32.9* 28.4* 29.6*  MCV 82.9 83.6 85.9 85.6 86.4 85.3 85.1  PLT 316 302 320 310 296 268 272    Cardiac Enzymes: No results for input(s): CKTOTAL, CKMB, CKMBINDEX, TROPONINI in the last 168 hours.  BNP: Invalid input(s): POCBNP  CBG: Recent  Labs  Lab 10/10/20 1553 10/10/20 2010 10/10/20 2022 10/11/20 0710 10/11/20 1218  GLUCAP 141* 134* 139* 113* 150*    Microbiology: Results for orders placed or performed during the hospital encounter of 09/25/20  Culture, blood (routine x 2)     Status: Abnormal   Collection Time: 09/25/20  2:41 PM   Specimen: BLOOD  Result Value Ref Range Status   Specimen Description   Final    BLOOD RIGHT ANTECUBITAL Performed at [facility], [address], [place], [place] [address]    Special Requests   Final    BOTTLES DRAWN AEROBIC AND ANAEROBIC Blood Culture results may not be optimal due to an excessive volume of blood received in culture bottles Performed at [facility], [address] [place], [place] [address]    Culture  Setup Time   Final    Organism ID to follow [place] CRITICAL RESULT CALLED TO, READ BACK BY AND VERIFIED WITH: [place] @0802  ON 09/26/20.[place] Performed at [facility], [address] [place], [place] [address]    Culture ENTEROCOCCUS FAECALIS  (A)  Final   Report Status 09/28/2020 FINAL  Final   Organism ID, Bacteria ENTEROCOCCUS FAECALIS  Final      Susceptibility   Enterococcus faecalis - MIC*    AMPICILLIN <=2 SENSITIVE Sensitive     VANCOMYCIN 1 SENSITIVE Sensitive     GENTAMICIN SYNERGY SENSITIVE Sensitive     * ENTEROCOCCUS FAECALIS  Blood Culture ID Panel (Reflexed)     Status: Abnormal   Collection Time: 09/25/20  2:41 PM  Result Value Ref Range Status   Enterococcus faecalis DETECTED (A) NOT DETECTED Final    Comment: CRITICAL RESULT CALLED TO, READ BACK BY AND VERIFIED WITH: LISA KLUTTZ @0802  ON 09/26/20.SH    Enterococcus Faecium NOT DETECTED NOT DETECTED Final   Listeria monocytogenes NOT DETECTED NOT DETECTED Final   Staphylococcus species NOT DETECTED NOT DETECTED Final   Staphylococcus aureus (BCID) NOT DETECTED NOT DETECTED Final   Staphylococcus epidermidis NOT DETECTED NOT DETECTED Final   Staphylococcus lugdunensis NOT DETECTED NOT DETECTED Final   Streptococcus species NOT DETECTED NOT DETECTED Final   Streptococcus agalactiae NOT DETECTED NOT DETECTED Final   Streptococcus pneumoniae NOT DETECTED NOT DETECTED Final   Streptococcus pyogenes NOT DETECTED NOT DETECTED Final   A.calcoaceticus-baumannii NOT DETECTED NOT DETECTED Final   Bacteroides fragilis DETECTED (A) NOT DETECTED Final    Comment: CRITICAL RESULT CALLED TO, READ BACK BY AND VERIFIED WITH: LISA KLUTTZ @0802  ON 09/26/20.SH    Enterobacterales NOT DETECTED NOT DETECTED Final   Enterobacter cloacae complex NOT DETECTED NOT DETECTED Final   Escherichia coli NOT DETECTED NOT DETECTED Final   Klebsiella aerogenes NOT DETECTED NOT DETECTED Final   Klebsiella oxytoca NOT DETECTED NOT DETECTED Final   Klebsiella pneumoniae NOT DETECTED NOT DETECTED Final   Proteus species NOT DETECTED NOT DETECTED Final   Salmonella species NOT DETECTED NOT DETECTED Final   Serratia marcescens NOT DETECTED NOT DETECTED Final   Haemophilus influenzae NOT  DETECTED NOT DETECTED Final   Neisseria meningitidis NOT DETECTED NOT DETECTED Final   Pseudomonas aeruginosa NOT DETECTED NOT DETECTED Final   Stenotrophomonas maltophilia NOT DETECTED NOT DETECTED Final   Candida albicans NOT DETECTED NOT DETECTED Final   Candida auris NOT DETECTED NOT DETECTED Final   Candida glabrata NOT DETECTED NOT DETECTED Final   Candida krusei NOT DETECTED NOT DETECTED Final   Candida parapsilosis  NOT DETECTED NOT DETECTED Final   Candida tropicalis NOT DETECTED NOT DETECTED Final   Cryptococcus neoformans/gattii NOT DETECTED NOT DETECTED Final   Vancomycin resistance NOT DETECTED NOT DETECTED Final    Comment: Performed at [facility], [address] [place], [place] [address]  Culture, blood (routine x 2)     Status: None   Collection Time: 09/25/20  3:42 PM   Specimen: BLOOD  Result Value Ref Range Status   Specimen Description BLOOD BLOOD LEFT HAND  Final   Special Requests   Final    BOTTLES DRAWN AEROBIC AND ANAEROBIC Blood Culture adequate volume   Culture   Final    NO GROWTH 5 DAYS Performed at [facility], [address] [place], [place] [address]    Report Status 09/30/2020 FINAL  Final  Resp Panel by RT-PCR (Flu A&B, Covid) Nasopharyngeal Swab     Status: None   Collection Time: 09/25/20  3:51 PM   Specimen: Nasopharyngeal Swab; Nasopharyngeal(NP) swabs in vial transport medium  Result Value Ref Range Status   SARS Coronavirus 2 by RT PCR NEGATIVE NEGATIVE Final    Comment: (NOTE) SARS-CoV-2 target nucleic acids are NOT DETECTED.  The SARS-CoV-2 RNA is generally detectable in upper respiratory specimens during the acute phase of infection. The lowest concentration of SARS-CoV-2 viral copies this assay can detect is 138 copies/mL. A negative result does not preclude SARS-Cov-2 infection and should not be used as the sole basis for treatment or other patient management decisions. A negative result may occur with   improper specimen collection/handling, submission of specimen other than nasopharyngeal swab, presence of viral mutation(s) within the areas targeted by this assay, and inadequate number of viral copies(<138 copies/mL). A negative result must be combined with clinical observations, patient history, and epidemiological information. The expected result is Negative.  Fact Sheet for Patients:  EntrepreneurPulse.com.au  Fact Sheet for Healthcare Providers:  IncredibleEmployment.be  This test is no t yet approved or cleared by the [place] FDA and  has been authorized for detection and/or diagnosis of SARS-CoV-2 by FDA under an Emergency Use Authorization (EUA). This EUA will remain  in effect (meaning this test can be used) for the duration of the COVID-19 declaration under Section 564(b)(1) of the Act, 21 U.S.C.section 360bbb-3(b)(1), unless the authorization is terminated  or revoked sooner.       Influenza A by PCR NEGATIVE NEGATIVE Final   Influenza B by PCR NEGATIVE NEGATIVE Final    Comment: (NOTE) The Xpert Xpress SARS-CoV-2/FLU/RSV plus assay is intended as an aid in the diagnosis of influenza from Nasopharyngeal swab specimens and should not be used as a sole basis for treatment. Nasal washings and aspirates are unacceptable for Xpert Xpress SARS-CoV-2/FLU/RSV testing.  Fact Sheet for Patients: EntrepreneurPulse.com.au  Fact Sheet for Healthcare Providers: IncredibleEmployment.be  This test is not yet approved or cleared by the [place] FDA and has been authorized for detection and/or diagnosis of SARS-CoV-2 by FDA under an Emergency Use Authorization (EUA). This EUA will remain in effect (meaning this test can be used) for the duration of the COVID-19 declaration under Section 564(b)(1) of the Act, 21 U.S.C. section 360bbb-3(b)(1), unless the authorization is terminated  or revoked.  Performed at [facility], [address] [place],  [address]   MRSA PCR Screening     Status: None   Collection Time: 09/25/20  6:44 PM   Specimen: Nasopharyngeal  Result Value Ref Range Status   MRSA by PCR  NEGATIVE NEGATIVE Final    Comment:        The GeneXpert MRSA Assay (FDA approved for NASAL specimens only), is one component of a comprehensive MRSA colonization surveillance program. It is not intended to diagnose MRSA infection nor to guide or monitor treatment for MRSA infections. Performed at [facility], [address] [place], [place] [address]   Aerobic/Anaerobic Culture (surgical/deep wound)     Status: None   Collection Time: 09/25/20  9:02 PM   Specimen: Abscess  Result Value Ref Range Status   Specimen Description   Final    ABSCESS Performed at [facility], [address] [place], [place] [address]    Special Requests   Final    Normal Performed at [place], [place] [address]    Gram Stain   Final    ABUNDANT WBC PRESENT,BOTH PMN AND MONONUCLEAR MODERATE GRAM NEGATIVE COCCOBACILLI    Culture   Final    ABUNDANT ENTEROCOCCUS FAECALIS MODERATE BACTEROIDES FRAGILIS BETA LACTAMASE POSITIVE Performed at [facility], [place] [address] [place], [place] [address]    Report Status 09/28/2020 FINAL  Final   Organism ID, Bacteria ENTEROCOCCUS FAECALIS  Final      Susceptibility   Enterococcus faecalis - MIC*    AMPICILLIN <=2 SENSITIVE Sensitive     VANCOMYCIN 1 SENSITIVE Sensitive     GENTAMICIN SYNERGY SENSITIVE Sensitive     * ABUNDANT ENTEROCOCCUS FAECALIS  Urine culture     Status: None   Collection Time: 09/26/20  1:36 PM   Specimen: Urine, Random  Result Value Ref Range Status   Specimen Description   Final    URINE, RANDOM Performed at [facility], [address] [place], [place] [address]    Special Requests   Final     NONE Performed at [facility], [address], [place], [place] [address]    Culture   Final    NO GROWTH Performed at [facility], [place] [address], [place], [place] [address]    Report Status 09/28/2020 FINAL  Final  Culture, blood (single) w Reflex to ID Panel     Status: None   Collection Time: 09/30/20  3:35 PM   Specimen: BLOOD  Result Value Ref Range Status   Specimen Description BLOOD BLOOD RIGHT HAND  Final   Special Requests   Final    BOTTLES DRAWN AEROBIC AND ANAEROBIC Blood Culture adequate volume   Culture   Final    NO GROWTH 5 DAYS Performed at [facility], [address] [place],  [address]    Report Status 10/05/2020 FINAL  Final    Coagulation Studies: No results for input(s): LABPROT, INR in the last 72 hours.  Urinalysis: No results for input(s): COLORURINE, LABSPEC, PHURINE, GLUCOSEU, HGBUR, BILIRUBINUR, KETONESUR, PROTEINUR, UROBILINOGEN, NITRITE, LEUKOCYTESUR in the last 72 hours.  Invalid input(s): APPERANCEUR    Imaging: No results found.   Medications:   . sodium chloride Stopped (10/09/20 1150)  . sodium chloride    . sodium chloride Stopped (10/04/20 1112)  . ampicillin-sulbactam (UNASYN) IV 3 g (10/11/20 1243)  . dextrose 5 % with kcl 100 mL/hr at 10/11/20 0208   . Chlorhexidine Gluconate Cloth  6 each Topical Daily  . docusate  100 mg Oral BID  . feeding supplement  237 mL Oral TID BM  . heparin injection (subcutaneous)  5,000 Units Subcutaneous Q8H  . insulin aspart  0-20  Units Subcutaneous TID WC  . insulin aspart  0-5 Units Subcutaneous QHS  . metoprolol tartrate  25 mg Oral Q6H  . multivitamin  1 tablet Oral QHS  . multivitamin with minerals  1 tablet Oral Daily  . pantoprazole (PROTONIX) IV  40 mg Intravenous Q24H  . polyethylene glycol  17 g Oral Daily  . sodium chloride flush  3 mL Intravenous Q12H    Assessment/ Plan:     Active Problems:   Atrial fibrillation (HCC)   Sepsis with  acute renal failure and septic shock (HCC)   Altered mental status   Abscess   Acute kidney failure, unspecified ([place])   Encounter for nasogastric (NG) tube placement   Acute respiratory failure (HCC)   Postprocedural intraabdominal abscess   Protein-calorie malnutrition, severe  65 year old white male with history of hypertension, bladder cancer, diabetes, peripheral vascular disease, now admitted with history of septic shock associated with the acute on chronic kidney disease.  Sepsis: With a history of enterococcal faecalis and bacteroids fragilis sepsis.  Now improving with IV antibiotics.  Acute kidney injury on the top of chronic kidney disease.  Patient had a baseline creatinine of 1.5.  Presently the creatinine has been improving slowly but steadily.  Hypokalemia: Supplement potassium as ordered.  Spoke to the family at bedside in detail and discussed with them regarding the importance of outpatient follow-up.  We will follow closely during the hospitalization and give recommendations on regular basis.   LOS: [place], [facility] 5/28/20221:48 PM

## 2020-10-12 DIAGNOSIS — N170 Acute kidney failure with tubular necrosis: Secondary | ICD-10-CM | POA: Diagnosis not present

## 2020-10-12 DIAGNOSIS — J9601 Acute respiratory failure with hypoxia: Secondary | ICD-10-CM | POA: Diagnosis not present

## 2020-10-12 DIAGNOSIS — R4182 Altered mental status, unspecified: Secondary | ICD-10-CM | POA: Diagnosis not present

## 2020-10-12 DIAGNOSIS — T8149XA Infection following a procedure, other surgical site, initial encounter: Secondary | ICD-10-CM | POA: Diagnosis not present

## 2020-10-12 LAB — BASIC METABOLIC PANEL
Anion gap: 8 (ref 5–15)
BUN: 33 mg/dL — ABNORMAL HIGH (ref 8–23)
CO2: 23 mmol/L (ref 22–32)
Calcium: 8.3 mg/dL — ABNORMAL LOW (ref 8.9–10.3)
Chloride: 110 mmol/L (ref 98–111)
Creatinine, Ser: 1.83 mg/dL — ABNORMAL HIGH (ref 0.61–1.24)
GFR, Estimated: 40 mL/min — ABNORMAL LOW (ref >60)
Glucose, Bld: 124 mg/dL — ABNORMAL HIGH (ref 70–99)
Potassium: 3.9 mmol/L (ref 3.5–5.1)
Sodium: 141 mmol/L (ref 135–145)

## 2020-10-12 LAB — CBC
HCT: 30.1 % — ABNORMAL LOW (ref 39.0–52.0)
Hemoglobin: 9.8 g/dL — ABNORMAL LOW (ref 13.0–17.0)
MCH: 27.3 pg (ref 26.0–34.0)
MCHC: 32.6 g/dL (ref 30.0–36.0)
MCV: 83.8 fL (ref 80.0–100.0)
Platelets: 241 10*3/uL (ref 150–400)
RBC: 3.59 MIL/uL — ABNORMAL LOW (ref 4.22–5.81)
RDW: 17.8 % — ABNORMAL HIGH (ref 11.5–15.5)
WBC: 6.6 10*3/uL (ref 4.0–10.5)
nRBC: 0 % (ref 0.0–0.2)

## 2020-10-12 LAB — GLUCOSE, CAPILLARY
Glucose-Capillary: 107 mg/dL — ABNORMAL HIGH (ref 70–99)
Glucose-Capillary: 145 mg/dL — ABNORMAL HIGH (ref 70–99)
Glucose-Capillary: 127 mg/dL — ABNORMAL HIGH (ref 70–99)
Glucose-Capillary: 92 mg/dL (ref 70–99)

## 2020-10-12 LAB — MAGNESIUM: Magnesium: 2.1 mg/dL (ref 1.7–2.4)

## 2020-10-12 LAB — HEPARIN LEVEL (UNFRACTIONATED): Heparin Unfractionated: 0.35 IU/mL (ref 0.30–0.70)

## 2020-10-12 NOTE — Progress Notes (Signed)
[place] for heparin infusion  Indication: atrial fibrillation  Allergies  Allergen Reactions  . Cyclobenzaprine Other (See Comments)    Not improving pain and makes him agressive.  . Lisinopril Cough    Patient Measurements: Height: 6' 3.98" (193 cm) Weight: 108 kg (238 lb 3.2 oz) IBW/kg (Calculated) : 86.76  Vital Signs: Temp: 97.8 F (36.6 C) (05/28 2023) BP: 150/94 (05/28 2023) Pulse Rate: 60 (05/28 2023)  Labs: Recent Labs    10/10/20 0644 10/11/20 0552 10/11/20 2102 10/12/20 0305  HGB 9.1* 9.4*  --  9.8*  HCT 28.4* 29.6*  --  30.1*  PLT 268 272  --  241  HEPARINUNFRC  --   --  0.50 0.35  CREATININE 2.01* 1.97*  --  1.83*    Estimated Creatinine Clearance: 54.2 mL/min (A) (by C-G formula based on SCr of 1.83 mg/dL (H)).   Medical History: Past Medical History:  Diagnosis Date  . Cancer [facility])    Bladder Cancer  . Erectile dysfunction associated with type 2 diabetes mellitus ([place]) 05/26/2016  . Gross hematuria 11/16/2016  . Hypertension 06/11/2015  . Type 2 diabetes mellitus with diabetic polyneuropathy, without long-term current use of insulin ([place]) 06/11/2015     Assessment: 65 yo male  with history of hypertension, bladder cancer, diabetes type 2, acute cholecystitis with cholecystectomy tube placed July 10 2020, followed by robotic assisted cholecystectomy September 08, 2020, in the intensive care unit with septic shock, bacteremia, large abdominal abscess, multiorgan failure with complications of severe metabolic acidosis,  atrial fibrillation, acute renal failure with ATN from sepsis and started on hemodialysis. Pt was previously on SQ heparin for DVT ppx. Pharmacy has been consulted for heparin dosing and monitoring until Eliquis can be started.    Date Time aPTT/HL Rate/Comment 5/28 2102 0.5  Therapeutic x1; 1600 un/hr 5/29     0305    0.35                 Therapeutic X 2  Baseline Labs: aPTT - 38s Hgb -  10>9.4 Plts - 562 on 5/15; 373-379 on 5/17-19; 316-302 on 5/20-22; 296-272 on 5/26-28  Goal of Therapy:  Heparin level 0.3-0.7 units/ml Monitor platelets by anticoagulation protocol: Yes   Plan:  5/29:  HL @ 0305 = 0.35 Will continue pt on current rate and recheck HL on 5/30 with AM labs.   Kosta Schnitzler D, PharmD 10/12/2020 4:05 AM

## 2020-10-12 NOTE — Progress Notes (Signed)
Patient in bathroom, pulled cord, but did not wait to try to stand.  Per patient patient slid down wall.   Did not hit head.    Nurse tech in room just moments after cord pulled.    Vital sign collected, got patient to chair then a wheel chair and back to bed.    Noted previous abrasion bleeding, cleansed and applied a foam to left knee.    Paged MD to let them know

## 2020-10-12 NOTE — Progress Notes (Signed)
Progress Note  Patient Name: Richard Oconnell Date of Encounter: 10/12/2020  [facility] Cardiologist:   Subjective   On rounds this morning, family in the room, discussed updates Reports his legs are getting somewhat stronger, able to walk to the bathroom with walker with no significant assistance, moving slowly, continued gait instability Reports appetite good, eating drinking well  Telemetry reviewed, no significant arrhythmia Has been receiving metoprolol tartrate 25 twice daily, asymptomatic  Inpatient Medications    Scheduled Meds: . amLODipine  5 mg Oral Daily  . Chlorhexidine Gluconate Cloth  6 each Topical Daily  . feeding supplement  237 mL Oral TID BM  . insulin aspart  0-20 Units Subcutaneous TID WC  . insulin aspart  0-5 Units Subcutaneous QHS  . metoprolol tartrate  25 mg Oral BID  . multivitamin  1 tablet Oral QHS  . multivitamin with minerals  1 tablet Oral Daily  . pantoprazole (PROTONIX) IV  40 mg Intravenous Q24H  . sodium chloride flush  3 mL Intravenous Q12H   Continuous Infusions: . sodium chloride Stopped (10/09/20 1150)  . sodium chloride    . sodium chloride Stopped (10/04/20 1112)  . ampicillin-sulbactam (UNASYN) IV 3 g (10/12/20 1248)  . dextrose 5 % with kcl 100 mL/hr at 10/12/20 0406  . heparin 1,600 Units/hr (10/12/20 0403)   PRN Meds: sodium chloride, acetaminophen, albuterol, docusate, LORazepam, melatonin, metoprolol tartrate, ondansetron (ZOFRAN) IV, polyethylene glycol, sodium chloride flush   Vital Signs    Vitals:   10/12/20 0413 10/12/20 0747 10/12/20 0929 10/12/20 1156  BP: (!) 151/79 (!) 160/98  135/89  Pulse: 64 71 90 64  Resp: 18 18  18   Temp: 98.4 F (36.9 C) 98.3 F (36.8 C)  98.2 F (36.8 C)  TempSrc: Oral   Oral  SpO2: 96% 99%  94%  Weight:      Height:        Intake/Output Summary (Last 24 hours) at 10/12/2020 1359 Last data filed at 10/12/2020 0930 Gross per 24 hour  Intake 2920.03 ml  Output 3015 ml   Net -94.97 ml   Last 3 Weights 10/11/2020 10/10/2020 10/09/2020  Weight (lbs) 238 lb 3.2 oz 240 lb 11.9 oz 226 lb 13.7 oz  Weight (kg) 108.047 kg 109.2 kg 102.9 kg      Telemetry    Normal sinus rhythm, bradycardia.  Overnight rates into the 50s, very short junctional rhythm runs- Personally Reviewed  ECG     - Personally Reviewed  Physical Exam   Constitutional: Alert. No distress.  HENT:  Head: Grossly normal Eyes:  no discharge. No scleral icterus.  Neck: No JVD, no carotid bruits  Cardiovascular: Regular rate and rhythm, no murmurs appreciated Pulmonary/Chest: Clear to auscultation bilaterally, no wheezes or rails Abdominal: Soft.  no distension.  no tenderness.  drain in place Musculoskeletal: Normal range of motion Neurological:  normal muscle tone. Coordination normal. No atrophy Skin: Skin warm and dry Psychiatric: normal affect, pleasant   Labs    High Sensitivity Troponin:  No results for input(s): TROPONINIHS in the last 720 hours.    Chemistry Recent Labs  Lab 10/06/20 0419 10/07/20 0331 10/08/20 1010 10/09/20 0445 10/10/20 0644 10/11/20 0552 10/12/20 0305  NA 148* 147* 150* 149* 144 144 141  K 3.6 3.8 3.6 3.3* 3.5 3.3* 3.9  CL 113* 111 115* 114* 111 111 110  CO2 25 24 24 23 23 23 23   GLUCOSE 210* 170* 119* 128* 125* 102* 124*  BUN 73*  68* 65* 59* 50* 40* 33*  CREATININE 2.79* 2.60* 2.36* 2.08* 2.01* 1.97* 1.83*  CALCIUM 8.0* 8.0* 8.3* 8.3* 8.0* 8.3* 8.3*  PROT 6.4* 6.8  --   --   --   --   --   ALBUMIN 2.3* 2.3*  2.3* 2.3* 2.4*  --   --   --   AST 67* 64*  --   --   --   --   --   ALT 62* 64*  --   --   --   --   --   ALKPHOS 156* 156*  --   --   --   --   --   BILITOT 1.2 1.2  --   --   --   --   --   GFRNONAA 24* 27* 30* 35* 36* 37* 40*  ANIONGAP 10 12 11 12 10 10 8      Hematology Recent Labs  Lab 10/10/20 0644 10/11/20 0552 10/12/20 0305  WBC 8.1 6.9 6.6  RBC 3.33* 3.48* 3.59*  HGB 9.1* 9.4* 9.8*  HCT 28.4* 29.6* 30.1*  MCV  85.3 85.1 83.8  MCH 27.3 27.0 27.3  MCHC 32.0 31.8 32.6  RDW 17.9* 18.2* 17.8*  PLT 268 272 241    BNPNo results for input(s): BNP, PROBNP in the last 168 hours.   DDimer No results for input(s): DDIMER in the last 168 hours.   Radiology    No results found.  Cardiac Studies     Patient Profile     65 y.o.malewith a hx of HTN, DM2, bladder CA, previous admission with acute cholecystitis s/pcholecystotomy tube 2/24 with follow-up robotic assisted cholecystectomy 4/25, admitted 5/12 w/ septic shock, bacteremia, metabolic acidosis, multiorgan failure, and metabolic encephalopathy in the setting of a large abdominal abscess, complicated by paroxysmal afib w/ rvr.  Amio associated w/ bradycardia and jxnl rhythm.  [place] avoided due to draining abscess, lft abnormalities.   Assessment & Plan    Septic shock present on admission--  large abscess intra-abdominal (culture growing Enterococcus faecalis and Bacteroides fragilis) robotic assisted cholecystectomy in April 2022 --on Unasyn and fluconazole -- dispose percutaneous drain placement by IR into gallbladder. Abscess on 09/25/2020 -- drain was exchanged by IR on 5/19 -Drain still in place  Paroxysmal atrial fibrillation  CHA2DS2-VASc score 3 Frequent episodes of paroxysmal atrial fibrillation this admission in the setting of sepsis, management challenging secondary to bradycardia/junctional rhythm Amiodarone previously held for junctional rhythm 2 days ago with atrial fibrillation 6 AM to 1:30 PM when off metoprolol Past 24 hours maintaining normal sinus rhythm, metoprolol reinitiated -On heparin infusion, plan to transition to Eliquis once biliary drain managed -Tolerating metoprolol tartrate 25 twice daily, maintaining normal sinus rhythm Typically asymptomatic when bradycardic or having short runs junctional    Abdominal abscess, sepsis  Enterococcal bacteremia  Followed by surgery and internal medicine  On broad-spectrum  antibiotics, blood pressure stable, mentation slowly improving   Essential hypertension  Amlodipine 5 with metoprolol tartrate 25 twice daily If blood pressure continues to run high could increase amlodipine up to 10 mg Will avoid ACE ARB in the setting of renal dysfunction  Chronic renal insufficiency creatinine up to 6.13--- at its peak started on hemodialysis, two treatments. Creatinine trending down, likely component of chronic renal sufficiency in the setting of ATN from sepsis  Long discussion with patient and family at the bedside, all questions answered Total encounter time more than 35 minutes Greater than 50% was spent in counseling and  coordination of care with the patient  For questions or updates, please contact [place] Please consult [email or web address] for contact info under        Signed, Ida Rogue, MD  10/12/2020, 1:59 PM

## 2020-10-12 NOTE — Progress Notes (Signed)
[place] [facility] [place] NAME: Richard Oconnell    MR#:  [id number]  DATE OF BIRTH:  1956-01-28  SUBJECTIVE:  Frequent stools improved today. Po intake improving. No vomiting or abdominal pain. Noted to have output from abdominal drain  REVIEW OF SYSTEMS:   Tolerating Diet: working with speech therapist Tolerating PT: yes  DRUG ALLERGIES:   Allergies  Allergen Reactions  . Cyclobenzaprine Other (See Comments)    Not improving pain and makes him agressive.  . Lisinopril Cough    VITALS:  Blood pressure 135/89, pulse 64, temperature 98.2 F (36.8 C), temperature source Oral, resp. rate 18, height 6' 3.98" (1.93 m), weight 108 kg, SpO2 94 %.  PHYSICAL EXAMINATION:   Physical Exam  General exam: Alert, awake, oriented x 3 Respiratory system: Clear to auscultation. Respiratory effort normal. Cardiovascular system:RRR. No murmurs, rubs, gallops. Gastrointestinal system: Abdomen is nondistended, soft and nontender. No organomegaly or masses felt. Normal bowel sounds heard. rUQ drain with purulent fluid in bag Central nervous system: Alert and oriented. No focal neurological deficits. Extremities: No C/C/E, +pedal pulses Skin: No rashes, lesions or ulcers Psychiatry: Judgement and insight appear normal. Mood & affect appropriate.      LABORATORY PANEL:  CBC Recent Labs  Lab 10/12/20 0305  WBC 6.6  HGB 9.8*  HCT 30.1*  PLT 241    Chemistries  Recent Labs  Lab 10/07/20 0331 10/08/20 0428 10/12/20 0305  NA 147*   < > 141  K 3.8   < > 3.9  CL 111   < > 110  CO2 24   < > 23  GLUCOSE 170*   < > 124*  BUN 68*   < > 33*  CREATININE 2.60*   < > 1.83*  CALCIUM 8.0*   < > 8.3*  MG 2.1   < > 2.1  AST 64*  --   --   ALT 64*  --   --   ALKPHOS 156*  --   --   BILITOT 1.2  --   --    < > = values in this interval not displayed.   Cardiac Enzymes No results for input(s): TROPONINI in the last 168 hours. RADIOLOGY:  No results  found. ASSESSMENT AND PLAN:   [name] is a 65 year old gentleman with history of hypertension, bladder cancer, diabetes type 2, acute cholecystitis with cholecystectomy tube placed July 10 2020, followed by robotic assisted cholecystectomy September 08, 2020, in the intensive care unit with septic shock, bacteremia, large abdominal abscess, multiorgan failure with complications of severe metabolic acidosis,  atrial fibrillation , acute renal failure with ATN from sepsis and started on hemodialysis.  Acute hypoxic respiratory failure in the setting of severe metabolic encephalopathy due to septic shock, acute kidney injury secondary to ATN in the setting of sepsis -- patient was intubated and remained on the ventilator for several days. -- Extubated on 09/30/20 now on room air -- PRN bronchodilators, pulmonary toilet.  Septic shock present on admission-- resolved source large abscess intra-abdominal (culture growing Enterococcus faecalis and Bacteroides fragilis) history of acute cholecystitis with cholecystostomy July 06, 2008 followed by robotic assisted cholecystectomy in April 2022 -- WBC now normal -- followed by ID. Currently on unasyn since 5/23. He received 3 doses of IV fluconazole that completed on 5/23. Will follow up with ID regarding further antibiotic course -- s/p percutaneous drain placement by IR into gallbladder. Abscess on 09/25/2020 -- drain was exchanged by  IR on 5/19, currently having 40cc output per day -- was followed by general surgery, now signed off -will likely need follow up with IR regarding potential re-imaging and removal of drain  Acute kidney injury in the setting of sepsis due to ATN -- patient had creatinine up to 6.13--- started on hemodialysis. Got two HD treatments. -- Creatinine trending down 1.83-- Followed by nephrology. -- Good urine output. Dialysis on hold. -baseline creatinine 1.5  Acute metabolic encephalopathy/toxic in the setting  of sepsis AK I -- remains confused/delirious, likely metabolic from renal failure, electrolyte abnormalities and icu delirium -- MRI brain showed no acute abnormality. Chronic microvascular disease --he is starting to show some improvements  A fib with RVR -- followed by Dr. Rockey Situ [facility] -- 5/23-- was on Cardizem gtt for few hours--now off.  --developed rapid a fib again on 5/27 --metoprolol being adjusted for rate control --started on IV heparin for anticoagulation with plans to transition to eliquis once RUQ drain has been addressed  Anemia without sign symptoms of obvious bleeding secondary to chronic illness -- transfuse as needed  Nutrition --5/24-- seen by speech therapist. NG tube removed. Swallow eval done at bedside.  -Initially on dysphagia 1 with nectar thick liquids -overall improved and diet now advanced to regular diet with thin liquids  Hypernatremia -likely related to free water deficit -may be contributing to encephalopathy -improving with hypotonic fluids  Hypokalemia -replace   Family communication : discussed with brother and sister at the bedside Consults : nephrology, ID, cardiology, surgery CODE STATUS: full code DVT Prophylaxis : heparin subcu Level of care: Progressive Cardiac Status is: Inpatient  Remains inpatient appropriate because:Inpatient level of care appropriate due to severity of illness   Dispo: The patient is from: Home              Anticipated d/c is to: SNF              Patient currently is not medically stable to d/c.   Difficult to place patient No  Patient is critically ill with multiple comorbidities and medical issues. Slowly improving.     TOTAL TIME TAKING CARE OF THIS PATIENT: 35 minutes.  >50% time spent on counselling and coordination of care  Note: This dictation was prepared with Dragon dictation along with smaller phrase technology. Any transcriptional errors that result from this process are  unintentional.  Kathie Dike M.D    [facility]   CC: Primary care physician; [name], DOPatient ID: Richard Oconnell, male   DOB: 1955/11/28, 65 y.o.   MRN: [id number]

## 2020-10-12 NOTE — Progress Notes (Signed)
[facility]  PROGRESS NOTE   Subjective:   Patient is out of bed to chair.  Less confused today. Urine output is good.  Objective:  Vital signs in last 24 hours:  Temp:  [97.8 F (36.6 C)-98.8 F (37.1 C)] 98.3 F (36.8 C) (05/29 0747) Pulse Rate:  [60-90] 90 (05/29 0929) Resp:  [18-19] 18 (05/29 0747) BP: (144-160)/(79-98) 160/98 (05/29 0747) SpO2:  [96 %-99 %] 99 % (05/29 0747)  Weight change:  Filed Weights   10/09/20 0500 10/10/20 0500 10/11/20 0450  Weight: 102.9 kg 109.2 kg 108 kg    Intake/Output: I/O last 3 completed shifts: In: 2690 [I.V.:2476.7; Other:10; IV Piggyback:203.3] Out: 2140 [Urine:2100; Drains:40]   Intake/Output this shift:  Total I/O In: -  Out: 544 [Urine:675]  Physical Exam: General:  No acute distress  Head:  Normocephalic, atraumatic. Moist oral mucosal membranes  Eyes:  Anicteric  Neck:  Supple  Lungs:   Clear to auscultation, normal effort  Heart:  S1S2 no rubs  Abdomen:   Soft, nontender, bowel sounds present  Extremities:  peripheral edema.  Neurologic:  Awake, alert, following commands  Skin:  No lesions  Access:     Basic Metabolic Panel: Recent Labs  Lab 10/06/20 0419 10/07/20 0331 10/08/20 0428 10/08/20 1010 10/09/20 0445 10/10/20 0644 10/11/20 0552 10/12/20 0305  NA 148* 147*  --  150* 149* 144 144 141  K 3.6 3.8  --  3.6 3.3* 3.5 3.3* 3.9  CL 113* 111  --  115* 114* 111 111 110  CO2 25 24  --  24 23 23 23 23   GLUCOSE 210* 170*  --  119* 128* 125* 102* 124*  BUN 73* 68*  --  65* 59* 50* 40* 33*  CREATININE 2.79* 2.60*  --  2.36* 2.08* 2.01* 1.97* 1.83*  CALCIUM 8.0* 8.0*  --  8.3* 8.3* 8.0* 8.3* 8.3*  MG 2.1 2.1 2.1  --  2.1 1.9 1.9 2.1  PHOS 3.8 4.1  --  4.5 4.2  --   --   --     Liver Function Tests: Recent Labs  Lab 10/06/20 0419 10/07/20 0331 10/08/20 1010 10/09/20 0445  AST 67* 64*  --   --   ALT 62* 64*  --   --   ALKPHOS 156* 156*  --   --   BILITOT 1.2 1.2  --   --   PROT 6.4*  6.8  --   --   ALBUMIN 2.3* 2.3*  2.3* 2.3* 2.4*   No results for input(s): LIPASE, AMYLASE in the last 168 hours. No results for input(s): AMMONIA in the last 168 hours.  CBC: Recent Labs  Lab 10/06/20 0419 10/07/20 0331 10/08/20 1011 10/09/20 0445 10/10/20 0644 10/11/20 0552 10/12/20 0305  WBC 15.1*   < > 13.2* 10.3 8.1 6.9 6.6  NEUTROABS 11.8*  --   --   --   --   --   --   HGB 10.1*   < > 9.5* 10.3* 9.1* 9.4* 9.8*  HCT 31.2*   < > 30.2* 32.9* 28.4* 29.6* 30.1*  MCV 83.6   < > 85.6 86.4 85.3 85.1 83.8  PLT 302   < > 310 296 268 272 241   < > = values in this interval not displayed.    Cardiac Enzymes: No results for input(s): CKTOTAL, CKMB, CKMBINDEX, TROPONINI in the last 168 hours.  BNP: Invalid input(s): POCBNP  CBG: Recent Labs  Lab 10/11/20 0710 10/11/20 1218 10/11/20  1559 10/11/20 1953 10/12/20 [place]*    Microbiology: Results for orders placed or performed during the hospital encounter of 09/25/20  Culture, blood (routine x 2)     Status: Abnormal   Collection Time: 09/25/20  2:41 PM   Specimen: BLOOD  Result Value Ref Range Status   Specimen Description   Final    BLOOD RIGHT ANTECUBITAL Performed at [facility], [address] [place], [place] [address]    Special Requests   Final    BOTTLES DRAWN AEROBIC AND ANAEROBIC Blood Culture results may not be optimal due to an excessive volume of blood received in culture bottles Performed at [facility], [address] [place], [place] [address]    Culture  Setup Time   Final    Organism ID to follow [place] CRITICAL RESULT CALLED TO, READ BACK BY AND VERIFIED WITH: [place] @0802  ON 09/26/20.Lamar Performed at [facility], [address] [place], [place] [address]    Culture ENTEROCOCCUS FAECALIS (A)  Final   Report Status 09/28/2020 FINAL  Final   Organism ID, Bacteria  ENTEROCOCCUS FAECALIS  Final      Susceptibility   Enterococcus faecalis - MIC*    AMPICILLIN <=2 SENSITIVE Sensitive     VANCOMYCIN 1 SENSITIVE Sensitive     GENTAMICIN SYNERGY SENSITIVE Sensitive     * ENTEROCOCCUS FAECALIS  Blood Culture ID Panel (Reflexed)     Status: Abnormal   Collection Time: 09/25/20  2:41 PM  Result Value Ref Range Status   Enterococcus faecalis DETECTED (A) NOT DETECTED Final    Comment: CRITICAL RESULT CALLED TO, READ BACK BY AND VERIFIED WITH: LISA KLUTTZ @0802  ON 09/26/20.SH    Enterococcus Faecium NOT DETECTED NOT DETECTED Final   Listeria monocytogenes NOT DETECTED NOT DETECTED Final   Staphylococcus species NOT DETECTED NOT DETECTED Final   Staphylococcus aureus (BCID) NOT DETECTED NOT DETECTED Final   Staphylococcus epidermidis NOT DETECTED NOT DETECTED Final   Staphylococcus lugdunensis NOT DETECTED NOT DETECTED Final   Streptococcus species NOT DETECTED NOT DETECTED Final   Streptococcus agalactiae NOT DETECTED NOT DETECTED Final   Streptococcus pneumoniae NOT DETECTED NOT DETECTED Final   Streptococcus pyogenes NOT DETECTED NOT DETECTED Final   A.calcoaceticus-baumannii NOT DETECTED NOT DETECTED Final   Bacteroides fragilis DETECTED (A) NOT DETECTED Final    Comment: CRITICAL RESULT CALLED TO, READ BACK BY AND VERIFIED WITH: LISA KLUTTZ @0802  ON 09/26/20.SH    Enterobacterales NOT DETECTED NOT DETECTED Final   Enterobacter cloacae complex NOT DETECTED NOT DETECTED Final   Escherichia coli NOT DETECTED NOT DETECTED Final   Klebsiella aerogenes NOT DETECTED NOT DETECTED Final   Klebsiella oxytoca NOT DETECTED NOT DETECTED Final   Klebsiella pneumoniae NOT DETECTED NOT DETECTED Final   Proteus species NOT DETECTED NOT DETECTED Final   Salmonella species NOT DETECTED NOT DETECTED Final   Serratia marcescens NOT DETECTED NOT DETECTED Final   Haemophilus influenzae NOT DETECTED NOT DETECTED Final   Neisseria meningitidis NOT DETECTED NOT DETECTED  Final   Pseudomonas aeruginosa NOT DETECTED NOT DETECTED Final   Stenotrophomonas maltophilia NOT DETECTED NOT DETECTED Final   Candida albicans NOT DETECTED NOT DETECTED Final   Candida auris NOT DETECTED NOT DETECTED Final   Candida glabrata NOT DETECTED NOT DETECTED Final   Candida krusei NOT DETECTED NOT DETECTED Final   Candida parapsilosis NOT DETECTED NOT DETECTED Final   Candida  tropicalis NOT DETECTED NOT DETECTED Final   Cryptococcus neoformans/gattii NOT DETECTED NOT DETECTED Final   Vancomycin resistance NOT DETECTED NOT DETECTED Final    Comment: Performed at [facility], [address] [place], [place] [address]  Culture, blood (routine x 2)     Status: None   Collection Time: 09/25/20  3:42 PM   Specimen: BLOOD  Result Value Ref Range Status   Specimen Description BLOOD BLOOD LEFT HAND  Final   Special Requests   Final    BOTTLES DRAWN AEROBIC AND ANAEROBIC Blood Culture adequate volume   Culture   Final    NO GROWTH 5 DAYS Performed at [facility], [address] [place], [place] [address]    Report Status 09/30/2020 FINAL  Final  Resp Panel by RT-PCR (Flu A&B, Covid) Nasopharyngeal Swab     Status: None   Collection Time: 09/25/20  3:51 PM   Specimen: Nasopharyngeal Swab; Nasopharyngeal(NP) swabs in vial transport medium  Result Value Ref Range Status   SARS Coronavirus 2 by RT PCR NEGATIVE NEGATIVE Final    Comment: (NOTE) SARS-CoV-2 target nucleic acids are NOT DETECTED.  The SARS-CoV-2 RNA is generally detectable in upper respiratory specimens during the acute phase of infection. The lowest concentration of SARS-CoV-2 viral copies this assay can detect is 138 copies/mL. A negative result does not preclude SARS-Cov-2 infection and should not be used as the sole basis for treatment or other patient management decisions. A negative result may occur with  improper specimen collection/handling, submission of specimen other than  nasopharyngeal swab, presence of viral mutation(s) within the areas targeted by this assay, and inadequate number of viral copies(<138 copies/mL). A negative result must be combined with clinical observations, patient history, and epidemiological information. The expected result is Negative.  Fact Sheet for Patients:  EntrepreneurPulse.com.au  Fact Sheet for Healthcare Providers:  IncredibleEmployment.be  This test is no t yet approved or cleared by the [place] FDA and  has been authorized for detection and/or diagnosis of SARS-CoV-2 by FDA under an Emergency Use Authorization (EUA). This EUA will remain  in effect (meaning this test can be used) for the duration of the COVID-19 declaration under Section 564(b)(1) of the Act, 21 U.S.C.section 360bbb-3(b)(1), unless the authorization is terminated  or revoked sooner.       Influenza A by PCR NEGATIVE NEGATIVE Final   Influenza B by PCR NEGATIVE NEGATIVE Final    Comment: (NOTE) The Xpert Xpress SARS-CoV-2/FLU/RSV plus assay is intended as an aid in the diagnosis of influenza from Nasopharyngeal swab specimens and should not be used as a sole basis for treatment. Nasal washings and aspirates are unacceptable for Xpert Xpress SARS-CoV-2/FLU/RSV testing.  Fact Sheet for Patients: EntrepreneurPulse.com.au  Fact Sheet for Healthcare Providers: IncredibleEmployment.be  This test is not yet approved or cleared by the [place] FDA and has been authorized for detection and/or diagnosis of SARS-CoV-2 by FDA under an Emergency Use Authorization (EUA). This EUA will remain in effect (meaning this test can be used) for the duration of the COVID-19 declaration under Section 564(b)(1) of the Act, 21 U.S.C. section 360bbb-3(b)(1), unless the authorization is terminated or revoked.  Performed at [facility], [address] [place],   [address]   MRSA PCR Screening     Status: None   Collection Time: 09/25/20  6:44 PM   Specimen: Nasopharyngeal  Result Value Ref Range Status   MRSA by PCR NEGATIVE NEGATIVE Final    Comment:  The GeneXpert MRSA Assay (FDA approved for NASAL specimens only), is one component of a comprehensive MRSA colonization surveillance program. It is not intended to diagnose MRSA infection nor to guide or monitor treatment for MRSA infections. Performed at [facility], [address] [place], [place] [address]   Aerobic/Anaerobic Culture (surgical/deep wound)     Status: None   Collection Time: 09/25/20  9:02 PM   Specimen: Abscess  Result Value Ref Range Status   Specimen Description   Final    ABSCESS Performed at [facility], [address], [place], [place] [address]    Special Requests   Final    Normal Performed at [place], [place] [address]    Gram Stain   Final    ABUNDANT WBC PRESENT,BOTH PMN AND MONONUCLEAR MODERATE GRAM NEGATIVE COCCOBACILLI    Culture   Final    ABUNDANT ENTEROCOCCUS FAECALIS MODERATE BACTEROIDES FRAGILIS BETA LACTAMASE POSITIVE Performed at [facility], [place] [address] [place], [place] [address]    Report Status 09/28/2020 FINAL  Final   Organism ID, Bacteria ENTEROCOCCUS FAECALIS  Final      Susceptibility   Enterococcus faecalis - MIC*    AMPICILLIN <=2 SENSITIVE Sensitive     VANCOMYCIN 1 SENSITIVE Sensitive     GENTAMICIN SYNERGY SENSITIVE Sensitive     * ABUNDANT ENTEROCOCCUS FAECALIS  Urine culture     Status: None   Collection Time: 09/26/20  1:36 PM   Specimen: Urine, Random  Result Value Ref Range Status   Specimen Description   Final    URINE, RANDOM Performed at [facility], [address], [place], [place] [address]    Special Requests   Final    NONE Performed at [facility], [address] [place], [place] [address]    Culture    Final    NO GROWTH Performed at [facility], [place] [address], [place], [place] [address]    Report Status 09/28/2020 FINAL  Final  Culture, blood (single) w Reflex to ID Panel     Status: None   Collection Time: 09/30/20  3:35 PM   Specimen: BLOOD  Result Value Ref Range Status   Specimen Description BLOOD BLOOD RIGHT HAND  Final   Special Requests   Final    BOTTLES DRAWN AEROBIC AND ANAEROBIC Blood Culture adequate volume   Culture   Final    NO GROWTH 5 DAYS Performed at [facility], [address] [place], [place] [address]    Report Status 10/05/2020 FINAL  Final    Coagulation Studies: No results for input(s): LABPROT, INR in the last 72 hours.  Urinalysis: No results for input(s): COLORURINE, LABSPEC, PHURINE, GLUCOSEU, HGBUR, BILIRUBINUR, KETONESUR, PROTEINUR, UROBILINOGEN, NITRITE, LEUKOCYTESUR in the last 72 hours.  Invalid input(s): APPERANCEUR    Imaging: No results found.   Medications:   . sodium chloride Stopped (10/09/20 1150)  . sodium chloride    . sodium chloride Stopped (10/04/20 1112)  . ampicillin-sulbactam (UNASYN) IV 3 g (10/12/20 0516)  . dextrose 5 % with kcl 100 mL/hr at 10/12/20 0406  . heparin 1,600 Units/hr (10/12/20 0403)   . amLODipine  5 mg Oral Daily  . Chlorhexidine Gluconate Cloth  6 each Topical Daily  . feeding supplement  237 mL Oral TID BM  . insulin aspart  0-20 Units Subcutaneous TID WC  . insulin aspart  0-5 Units Subcutaneous QHS  . metoprolol tartrate  25 mg Oral BID  . multivitamin  1 tablet Oral QHS  . multivitamin with minerals  1 tablet Oral Daily  . pantoprazole (PROTONIX) IV  40 mg Intravenous Q24H  . sodium chloride flush  3 mL Intravenous Q12H    Assessment/ Plan:     Active Problems:   Atrial fibrillation (HCC)   Sepsis with acute renal failure and septic shock (HCC)   Altered mental status   Abscess   Acute kidney failure, unspecified ([place])   Encounter for nasogastric (NG) tube  placement   Acute respiratory failure (HCC)   Postprocedural intraabdominal abscess   Protein-calorie malnutrition, severe  65 year old white male with history of hypertension, bladder cancer, diabetes, peripheral vascular disease, now admitted with history of septic shock associated with acute kidney injury on top of chronic kidney disease.  Sepsis: With a history of enterococcal faecalis and bacteroids fragilis sepsis.  Now improving with IV antibiotics.  Acute kidney injury on the top of chronic kidney disease.  Patient had a baseline creatinine of 1.5. Presently the creatinine has been improving slowly but steadily.  Hypokalemia: Improved with supplementation.  Spoke to the family at bedside in detail and discussed with them regarding the importance of outpatient follow-up.  We will follow closely during the hospitalization and give recommendations on regular basis.   LOS: [place], [facility] 5/29/202211:24 AM

## 2020-10-13 ENCOUNTER — Inpatient Hospital Stay: Payer: Medicare Other

## 2020-10-13 DIAGNOSIS — B952 Enterococcus as the cause of diseases classified elsewhere: Secondary | ICD-10-CM

## 2020-10-13 DIAGNOSIS — J9601 Acute respiratory failure with hypoxia: Secondary | ICD-10-CM | POA: Diagnosis not present

## 2020-10-13 DIAGNOSIS — Z7189 Other specified counseling: Secondary | ICD-10-CM | POA: Diagnosis not present

## 2020-10-13 DIAGNOSIS — L8990 Pressure ulcer of unspecified site, unspecified stage: Secondary | ICD-10-CM | POA: Insufficient documentation

## 2020-10-13 DIAGNOSIS — R7881 Bacteremia: Secondary | ICD-10-CM

## 2020-10-13 DIAGNOSIS — I480 Paroxysmal atrial fibrillation: Secondary | ICD-10-CM | POA: Diagnosis not present

## 2020-10-13 DIAGNOSIS — T8149XA Infection following a procedure, other surgical site, initial encounter: Secondary | ICD-10-CM | POA: Diagnosis not present

## 2020-10-13 DIAGNOSIS — K651 Peritoneal abscess: Secondary | ICD-10-CM

## 2020-10-13 DIAGNOSIS — N170 Acute kidney failure with tubular necrosis: Secondary | ICD-10-CM | POA: Diagnosis not present

## 2020-10-13 LAB — CBC
HCT: 30.7 % — ABNORMAL LOW (ref 39.0–52.0)
Hemoglobin: 9.8 g/dL — ABNORMAL LOW (ref 13.0–17.0)
MCH: 26.7 pg (ref 26.0–34.0)
MCHC: 31.9 g/dL (ref 30.0–36.0)
MCV: 83.7 fL (ref 80.0–100.0)
Platelets: 256 10*3/uL (ref 150–400)
RBC: 3.67 MIL/uL — ABNORMAL LOW (ref 4.22–5.81)
RDW: 18.1 % — ABNORMAL HIGH (ref 11.5–15.5)
WBC: 6.1 10*3/uL (ref 4.0–10.5)
nRBC: 0 % (ref 0.0–0.2)

## 2020-10-13 LAB — MAGNESIUM: Magnesium: 2 mg/dL (ref 1.7–2.4)

## 2020-10-13 LAB — GLUCOSE, CAPILLARY
Glucose-Capillary: 100 mg/dL — ABNORMAL HIGH (ref 70–99)
Glucose-Capillary: 137 mg/dL — ABNORMAL HIGH (ref 70–99)
Glucose-Capillary: 102 mg/dL — ABNORMAL HIGH (ref 70–99)
Glucose-Capillary: 135 mg/dL — ABNORMAL HIGH (ref 70–99)

## 2020-10-13 LAB — BASIC METABOLIC PANEL
Anion gap: 11 (ref 5–15)
BUN: 24 mg/dL — ABNORMAL HIGH (ref 8–23)
CO2: 21 mmol/L — ABNORMAL LOW (ref 22–32)
Calcium: 8.3 mg/dL — ABNORMAL LOW (ref 8.9–10.3)
Chloride: 110 mmol/L (ref 98–111)
Creatinine, Ser: 1.82 mg/dL — ABNORMAL HIGH (ref 0.61–1.24)
GFR, Estimated: 41 mL/min — ABNORMAL LOW (ref >60)
Glucose, Bld: 94 mg/dL (ref 70–99)
Potassium: 3.5 mmol/L (ref 3.5–5.1)
Sodium: 142 mmol/L (ref 135–145)

## 2020-10-13 LAB — HEPARIN LEVEL (UNFRACTIONATED)
Heparin Unfractionated: 0.21 IU/mL — ABNORMAL LOW (ref 0.30–0.70)
Heparin Unfractionated: 0.3 IU/mL (ref 0.30–0.70)
Heparin Unfractionated: 0.35 IU/mL (ref 0.30–0.70)

## 2020-10-13 MED ORDER — AMLODIPINE BESYLATE 10 MG PO TABS
10.0000 mg | ORAL_TABLET | Freq: Every day | ORAL | Status: DC
  Administered 2020-10-14 – 2020-10-15 (×2): 10 mg via ORAL
  Filled 2020-10-13 (×2): qty 1

## 2020-10-13 MED ORDER — HEPARIN BOLUS VIA INFUSION
1500.0000 [IU] | Freq: Once | INTRAVENOUS | Status: AC
  Administered 2020-10-13: 1500 [IU] via INTRAVENOUS
  Filled 2020-10-13: qty 1500

## 2020-10-13 MED ORDER — AMLODIPINE BESYLATE 5 MG PO TABS
5.0000 mg | ORAL_TABLET | Freq: Once | ORAL | Status: AC
  Administered 2020-10-13: 5 mg via ORAL
  Filled 2020-10-13: qty 1

## 2020-10-13 NOTE — TOC Progression Note (Signed)
Transition of Care [facility]) - Progression Note    Patient Details  Name: Richard Oconnell MRN: [id number] Date of Birth: 09/23/1955  Transition of Care [facility]) CM/SW [place], [place] Phone Number: 10/13/2020, 9:18 AM  Clinical Narrative:     CSW has lvm with [facility] to acknowledge bed acceptance in the hub and inquire as to bed availability, as this is family preference.   CSW has reached otu to PT for updated notes to initiate insurance authorization.   Expected Discharge Plan: [place] Barriers to Discharge: Continued Medical Work up  Expected Discharge Plan and Services Expected Discharge Plan: [place] In-house Referral: Clinical Social Work   Post Acute Care Choice: [place] Living arrangements for the past 2 months: Single Family Home                                       Social Determinants of Health (SDOH) Interventions    Readmission Risk Interventions No flowsheet data found.

## 2020-10-13 NOTE — Progress Notes (Signed)
[facility] NAME: Richard Oconnell    MR#:  [id number]  DATE OF BIRTH:  1955/10/22  SUBJECTIVE:  He had a fall yesterday when he tried to get up without assistance. Says that she slid down against the wall. Denies any injuries.   REVIEW OF SYSTEMS:   Tolerating Diet: working with Conservator, museum/gallery PT: yes  DRUG ALLERGIES:   Allergies  Allergen Reactions  . Cyclobenzaprine Other (See Comments)    Not improving pain and makes him agressive.  . Lisinopril Cough    VITALS:  Blood pressure (!) 146/85, pulse 82, temperature 98.2 F (36.8 C), temperature source Oral, resp. rate 16, height 6' 3.98" (1.93 m), weight 100.2 kg, SpO2 99 %.  PHYSICAL EXAMINATION:   Physical Exam  General exam: Alert, awake, oriented x 3 Respiratory system: Clear to auscultation. Respiratory effort normal. Cardiovascular system:RRR. No murmurs, rubs, gallops. Gastrointestinal system: Abdomen is nondistended, soft and nontender. No organomegaly or masses felt. Normal bowel sounds heard. Drain in [employer] nervous system: Alert and oriented. No focal neurological deficits. Extremities: No C/C/E, +pedal pulses Skin: No rashes, lesions or ulcers Psychiatry: Judgement and insight appear normal. Mood & affect appropriate.        LABORATORY PANEL:  CBC Recent Labs  Lab 10/13/20 0632  WBC 6.1  HGB 9.8*  HCT 30.7*  PLT 256    Chemistries  Recent Labs  Lab 10/07/20 0331 10/08/20 0428 10/13/20 0632  NA 147*   < > 142  K 3.8   < > 3.5  CL 111   < > 110  CO2 24   < > 21*  GLUCOSE 170*   < > 94  BUN 68*   < > 24*  CREATININE 2.60*   < > 1.82*  CALCIUM 8.0*   < > 8.3*  MG 2.1   < > 2.0  AST 64*  --   --   ALT 64*  --   --   ALKPHOS 156*  --   --   BILITOT 1.2  --   --    < > = values in this interval not displayed.   Cardiac Enzymes No results for input(s): TROPONINI in the last 168 hours. RADIOLOGY:  No results  found. ASSESSMENT AND PLAN:   [name] is a 65 year old gentleman with history of hypertension, bladder cancer, diabetes type 2, acute cholecystitis with cholecystectomy tube placed July 10 2020, followed by robotic assisted cholecystectomy September 08, 2020, in the intensive care unit with septic shock, bacteremia, large abdominal abscess, multiorgan failure with complications of severe metabolic acidosis,  atrial fibrillation , acute renal failure with ATN from sepsis and started on hemodialysis.  Acute hypoxic respiratory failure in the setting of severe metabolic encephalopathy due to septic shock, acute kidney injury secondary to ATN in the setting of sepsis -- patient was intubated and remained on the ventilator for several days. -- Extubated on 09/30/20 now on room air -- PRN bronchodilators, pulmonary toilet.  Septic shock present on admission-- resolved source large abscess intra-abdominal (culture growing Enterococcus faecalis and Bacteroides fragilis) history of acute cholecystitis with cholecystostomy July 06, 2008 followed by robotic assisted cholecystectomy in April 2022 -- WBC now normal -- followed by ID. Currently on unasyn since 5/23. He received 3 doses of IV fluconazole that completed on 5/23. Will follow up with ID regarding further antibiotic course -- s/p percutaneous drain placement by IR into gallbladder. Abscess on  09/25/2020 -- drain was exchanged by IR on 5/19, currently having 40cc output per day -- was followed by general surgery, now signed off -currently having approximately 15cc of output through drain in last 24 hours (minus flushes) --reviewed with Dr. Celine Ahr who recommended re-imaging and offered that it is preferable that there is <38ml/day of output prior to pulling the drain -Discussed with Dr. Delaine Lame regarding antibiotic course. She has also recommended repeat CT imaging of abdomen  Acute kidney injury in the setting of sepsis due to  ATN -- patient had creatinine up to 6.13--- started on hemodialysis. Got two HD treatments. -- Creatinine trending down 1.83-- Followed by nephrology. -- Good urine output. Dialysis on hold. -baseline creatinine 1.5  Acute metabolic encephalopathy/toxic in the setting of sepsis AK I -- remains confused/delirious, likely metabolic from renal failure, electrolyte abnormalities and icu delirium -- MRI brain showed no acute abnormality. Chronic microvascular disease --he is starting to show some improvements  A fib with RVR -- followed by Dr. Rockey Situ [facility] -- 5/23-- was on Cardizem gtt for few hours--now off.  --developed rapid a fib again on 5/27 --metoprolol being adjusted for rate control --started on IV heparin for anticoagulation with plans to transition to eliquis once RUQ drain has been addressed  Anemia without sign symptoms of obvious bleeding secondary to chronic illness -- transfuse as needed  Nutrition --5/24-- seen by speech therapist. NG tube removed. Swallow eval done at bedside.  -Initially on dysphagia 1 with nectar thick liquids -overall improved and diet now advanced to regular diet with thin liquids  Hypernatremia -likely related to free water deficit -may be contributing to encephalopathy -improving with hypotonic fluids  Hypokalemia -replace   Family communication : discussed with brother and sister at the bedside Consults : nephrology, ID, cardiology, surgery CODE STATUS: full code DVT Prophylaxis : heparin subcu Level of care: Progressive Cardiac Status is: Inpatient  Remains inpatient appropriate because:Inpatient level of care appropriate due to severity of illness   Dispo: The patient is from: Home              Anticipated d/c is to: SNF              Patient currently is not medically stable to d/c.   Difficult to place patient No  Patient is critically ill with multiple comorbidities and medical issues. Slowly improving.      TOTAL TIME TAKING CARE OF THIS PATIENT: 35 minutes.  >50% time spent on counselling and coordination of care  Note: This dictation was prepared with Dragon dictation along with smaller phrase technology. Any transcriptional errors that result from this process are unintentional.  Kathie Dike M.D    [facility]   CC: Primary care physician; [name], DOPatient ID: Richard Oconnell, male   DOB: 12/02/1955, 65 y.o.   MRN: [id number]

## 2020-10-13 NOTE — Progress Notes (Signed)
30 ml of material drained from abdominal drainage bag.   This amount was a culmination yesterday, last night and today until 2:30pm.  Drain was flush a total of 4 times since yesterday morning (5 ml each).

## 2020-10-13 NOTE — Progress Notes (Signed)
Date of Admission:  09/25/2020 antibiotic 5/12-5/19/22  Zosyn Unasyn 5/19 onwards     ID: Richard Oconnell is a 65 y.o. male  Principal Problem:   Sepsis with acute renal failure and septic shock ([place]) Active Problems:   Atrial fibrillation ([place])   Altered mental status   Abscess   Acute kidney failure, unspecified ([place])   Encounter for nasogastric (NG) tube placement   Acute respiratory failure ([place])   Postprocedural intraabdominal abscess   Protein-calorie malnutrition, severe   Pressure injury of skin  Past medical history February 22 2 admitted with acute cholecystitis. Underwent percutaneous cholecystotomy on July 10, 2020.  Underwent laparoscopic cholecystectomy on September 09, 2022.  DC'd on 09/09/2020 on oral Augmentin for 7 days with a JP drain.. Sep 16, 2020 JP drain removed by surgery as outpatient. Presented on 09/25/2020 with recurrent abdominal pain and chills confusion and weakness.  CT abdomen showed very large abscess in the gallbladder fossa measuring 10 cm in diameter.  And a markedly elevated white count.  He also had elevated LFTs.  He underwent IR drainage of 200 cc of pus.  Was in the ICU and on pressors. Seen by Dr. Ola Spurr on 09/26/2020.  He was initially on IV Zosyn.  Blood culture from 09/25/2020 grew Enterococcus faecalis.  Intra-abdominal abscess culture was positive for Enterococcus faecalis and Bacteroides fragilis.  Antibiotic was changed to ampicillin sulbactam.  Repeat blood culture on 09/30/2020 was negative. A repeat CT abdomen and pelvis done on 09/30/2020 showed the gallbladder fossa abscess measuring 6.8 x 3.6 x 5.2 cm with the pigtail catheter in place.The adjacent hepatic flexure is somewhat indistinct with some spanning stranding, making it difficult to completely exclude a fistula between the gallbladder fossa region and the colon although no obvious linear collection of extraluminal gas is seen. Patient was also in acute renal failure and had  to undergo dialysis for a few days.  The creatinine on admission was 4.89 increased from the baseline of 1.49.  The peak creatinine was 6.13 and today it is 1.82.  WBC was 45 on 09/26/2020 and today it is 6.1.    Subjective: Pt feeling better Says he has turned the corner appetiie better No pain abdomen  Medications:  . [START ON 10/14/2020] amLODipine  10 mg Oral Daily  . amLODipine  5 mg Oral Once  . Chlorhexidine Gluconate Cloth  6 each Topical Daily  . feeding supplement  237 mL Oral TID BM  . insulin aspart  0-20 Units Subcutaneous TID WC  . insulin aspart  0-5 Units Subcutaneous QHS  . metoprolol tartrate  25 mg Oral BID  . multivitamin  1 tablet Oral QHS  . multivitamin with minerals  1 tablet Oral Daily  . pantoprazole (PROTONIX) IV  40 mg Intravenous Q24H  . sodium chloride flush  3 mL Intravenous Q12H    Objective: Vital signs in last 24 hours: Temp:  [97.7 F (36.5 C)-98.1 F (36.7 C)] 98.1 F (36.7 C) (05/30 1202) Pulse Rate:  [59-84] 70 (05/30 1202) Resp:  [16-19] 18 (05/30 1202) BP: (132-170)/(74-96) 145/88 (05/30 1202) SpO2:  [93 %-99 %] 97 % (05/30 1202) Weight:  [100.2 kg-102.1 kg] 100.2 kg (05/30 1100)  PHYSICAL EXAM:  General: Alert, cooperative, no distress, appears stated age.  Head: Normocephalic, without obvious abnormality, atraumatic. Eyes: Conjunctivae clear, anicteric sclerae. Pupils are equal ENT Nares normal. No drainage or sinus tenderness. Lips, mucosa, and tongue normal. No Thrush Neck: Supple, symmetrical, no adenopathy, thyroid: non tender  no carotid bruit and no JVD. Back: No CVA tenderness. Lungs: Clear to auscultation bilaterally. No Wheezing or Rhonchi. No rales. Heart: Regular rate and rhythm, no murmur, rub or gallop. Abdomen: Soft, non-tender,not distended.rt upper quadrant JP drain Extremities: atraumatic, no cyanosis. No edema. No clubbing Skin: No rashes or lesions. Or bruising Lymph: Cervical, supraclavicular  normal. Neurologic: Grossly non-focal  Lab Results Recent Labs    10/12/20 0305 10/13/20 0632  WBC 6.6 6.1  HGB 9.8* 9.8*  HCT 30.1* 30.7*  NA 141 142  K 3.9 3.5  CL 110 110  CO2 23 21*  BUN 33* 24*  CREATININE 1.83* 1.82*   Liver Panel No results for input(s): PROT, ALBUMIN, AST, ALT, ALKPHOS, BILITOT, BILIDIR, IBILI in the last 72 hours. Sedimentation Rate No results for input(s): ESRSEDRATE in the last 72 hours. C-Reactive Protein No results for input(s): CRP in the last 72 hours. Microbiology: 5/12 [facility] enterococcus 5/17 BC NG 5/12 abscess culture enterococcus  Imaging CT abdomen 09/25/20 Large abscess in the gallbladder fossa, at least 10 cm in size. No evidence free intraperitoneal fluid or distant abscess/collection   Assessment/Plan: Gallbladder fossa abscess following acute cholecystitis, cholecystotomy, and laparoscopic cholecystectomy.  Enterococcus faecalis bacteremia secondary to gallbladder fossa abscess.  Doubt endocarditis and need for TEE as he has no device or prosthetic heart valve..  Enterococcus faecalis and Bacteroides fragilis intra-abdominal abscess.  Patient currently on day 18 of appropriate antibiotic.  Fever is resolved.  White count is normalized.  We can stop the antibiotics and observe.  Patient still has a JP drain and if it is not putting out much we may be able to get a repeat a CT abdomen and consider removing the drain.  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

## 2020-10-13 NOTE — Progress Notes (Signed)
Daily Progress Note   Patient Name: Richard Oconnell       Date: 10/13/2020 DOB: 1955/08/14  Age: 65 y.o. MRN#: [id number] Attending Physician: Kathie Dike, MD Primary Care Physician: [name], DO Admit Date: 09/25/2020  Reason for Consultation/Follow-up: Establishing goals of care  Subjective: Patient is resting in bed. He is oriented today. He discusses his career and education, and states he is the least educated of all his family. He states "because I am so odd, I have to explain myself a lot, and I'm like the nutty professor."   We discussed his diagnosis, prognosis, GOC, EOL wishes disposition and options.   A detailed discussion was had today regarding advanced directives.  Concepts specific to code status, artifical feeding and hydration, IV antibiotics and rehospitalization were discussed.  The difference between an aggressive medical intervention path and a comfort care path was discussed.  Values and goals of care important to patient and family were attempted to be elicited.   He discusses his health care and drain care currently, and prior to admission; we discuss his clinical picture, and he discusses the amount of antibiotics received, and his abdominal drain. He denies abdominal pain. He has been well updated as he discusses next steps appropriately and the need for rehab due to deconditioning.   In discussing goals of care, he states "I either go off into the twilight zone forever, or I do what I can to stick around and see how it goes." He states he wants to continue full code/ full scope as it has served him well thus far.   Length of Stay: 18  Current Medications: Scheduled Meds:  . amLODipine  5 mg Oral Daily  . Chlorhexidine Gluconate Cloth  6 each  Topical Daily  . feeding supplement  237 mL Oral TID BM  . insulin aspart  0-20 Units Subcutaneous TID WC  . insulin aspart  0-5 Units Subcutaneous QHS  . metoprolol tartrate  25 mg Oral BID  . multivitamin  1 tablet Oral QHS  . multivitamin with minerals  1 tablet Oral Daily  . pantoprazole (PROTONIX) IV  40 mg Intravenous Q24H  . sodium chloride flush  3 mL Intravenous Q12H    Continuous Infusions: . sodium chloride Stopped (10/09/20 1150)  . sodium chloride    .  sodium chloride Stopped (10/04/20 1112)  . ampicillin-sulbactam (UNASYN) IV 3 g (10/13/20 0512)  . heparin 1,800 Units/hr (10/13/20 0917)    PRN Meds: sodium chloride, acetaminophen, albuterol, docusate, LORazepam, melatonin, metoprolol tartrate, ondansetron (ZOFRAN) IV, polyethylene glycol, sodium chloride flush  Physical Exam Pulmonary:     Effort: Pulmonary effort is normal.  Neurological:     Mental Status: He is alert.             Vital Signs: BP (!) 146/86 (BP Location: Left Arm)   Pulse 84   Temp 98.1 F (36.7 C) (Oral)   Resp 18   Ht 6' 3.98" (1.93 m)   Wt 102.1 kg   SpO2 98%   BMI 27.40 kg/m  SpO2: SpO2: 98 % O2 Device: O2 Device: Room Air O2 Flow Rate: O2 Flow Rate (L/min): 3 L/min  Intake/output summary:   Intake/Output Summary (Last 24 hours) at 10/13/2020 1012 Last data filed at 10/13/2020 1001 Gross per 24 hour  Intake 365 ml  Output 2450 ml  Net -2085 ml   LBM: Last BM Date: 10/11/20 Baseline Weight: Weight: 113 kg Most recent weight: Weight: 102.1 kg       Palliative Assessment/Data:    Flowsheet Rows   Flowsheet Row Most Recent Value  Intake Tab   Referral Department Critical care  Unit at Time of Referral ICU  Palliative Care Primary Diagnosis Other (Comment)  Date Notified 09/29/20  Palliative Care Type New Palliative care  Reason for referral Clarify Goals of Care  Date of Admission 09/25/20  Date first seen by Palliative Care 09/29/20  # of days Palliative referral  response time 0 Day(s)  # of days IP prior to Palliative referral 4  Clinical Assessment   Psychosocial & Spiritual Assessment   Palliative Care Outcomes       Patient Active Problem List   Diagnosis Date Noted  . Pressure injury of skin 10/13/2020  . Protein-calorie malnutrition, severe 10/10/2020  . Postprocedural intraabdominal abscess   . Acute respiratory failure ([place]) 09/28/2020  . Encounter for nasogastric (NG) tube placement   . Altered mental status   . Abscess   . Acute kidney failure, unspecified ([place])   . Sepsis with acute renal failure and septic shock ([place]) 09/25/2020  . S/P laparoscopic cholecystectomy 09/08/2020  . Metabolic syndrome 82/95/6213  . Atrial fibrillation ([place]) 07/09/2020  . Class 1 obesity due to excess calories with serious comorbidity and body mass index (BMI) of 33.0 to 33.9 in adult 07/06/2019  . Malignant neoplasm of urinary bladder ([place]) 12/06/2018  . Overweight (BMI 25.0-29.9) 07/04/2018  . Hyperlipidemia associated with type 2 diabetes mellitus ([place]) 11/30/2016  . 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 Gross hematuria 11/16/2016  . Erectile dysfunction associated with type 2 diabetes mellitus ([place]) 05/26/2016  . Diabetes mellitus with coincident hypertension ([place]) 06/11/2015  . Type 2 diabetes mellitus with diabetic polyneuropathy, without long-term current use of insulin ([place]) 06/11/2015    Palliative Care Assessment & Plan    Recommendations/Plan:  Full code/full scope.   Code Status:    Code Status Orders  (From admission, onward)         Start     Ordered   09/25/20 1738  Full code  Continuous        09/25/20 1739        Code Status History    Date Active Date Inactive Code Status Order ID Comments User Context   09/08/2020 1930 09/09/2020 1917 Full Code [id number]  Celine Ahr,  Anderson Malta, MD Inpatient   07/09/2020 1819 07/12/2020 2215 Full Code [id number]  CoxBriant Cedar, DO ED   Advance Care Planning Activity        Thank you for  allowing the Palliative Medicine Team to assist in the care of this patient.   Total Time 35 min Prolonged Time Billed  no      Greater than 50%  of this time was spent counseling and coordinating care related to the above assessment and plan.  Asencion Gowda, NP  Please contact Palliative Medicine Team phone at [phone] for questions and concerns.

## 2020-10-13 NOTE — Progress Notes (Signed)
[facility] for heparin infusion  Indication: atrial fibrillation  Patient Measurements: Height: 6' 3.98" (193 cm) Weight: 102.1 kg (225 lb) IBW/kg (Calculated) : 86.76  Vital Signs: Temp: 97.7 F (36.5 C) (05/29 2020) BP: 132/74 (05/29 2020) Pulse Rate: 59 (05/29 2020)  Labs: Recent Labs    10/11/20 0552 10/11/20 2102 10/12/20 0305  HGB 9.4*  --  9.8*  HCT 29.6*  --  30.1*  PLT 272  --  241  HEPARINUNFRC  --  0.50 0.35  CREATININE 1.97*  --  1.83*    Estimated Creatinine Clearance: 49.4 mL/min (A) (by C-G formula based on SCr of 1.83 mg/dL (H)).   Medical History: Past Medical History:  Diagnosis Date  . Cancer [facility])    Bladder Cancer  . Erectile dysfunction associated with type 2 diabetes mellitus ([place]) 05/26/2016  . Gross hematuria 11/16/2016  . Hypertension 06/11/2015  . Type 2 diabetes mellitus with diabetic polyneuropathy, without long-term current use of insulin ([place]) 06/11/2015    Assessment: 65 yo male  with history of hypertension, bladder cancer, diabetes type 2, acute cholecystitis with cholecystectomy tube placed July 10 2020, followed by robotic assisted cholecystectomy September 08, 2020, in the intensive care unit with septic shock, bacteremia, large abdominal abscess, multiorgan failure with complications of severe metabolic acidosis,  atrial fibrillation, acute renal failure with ATN from sepsis and started on hemodialysis. Pt was previously on SQ heparin for DVT ppx. Pharmacy has been consulted for heparin dosing and monitoring until Eliquis can be started once biliary drain managed. H&H, platelets stable.  Goal of Therapy:  Heparin level 0.3-0.7 units/ml Monitor platelets by anticoagulation protocol: Yes   Plan:   Anti-Xa level subtherapeutic: bolus 1500 units IV, then increase infusion rate to 1800 units/hr  Re-check anti-Xa level 6 hours after rate change  Daily CBC while on IV heparin per protocol     Dallie Piles, PharmD 10/13/2020 7:42 AM

## 2020-10-13 NOTE — Progress Notes (Signed)
ANTICOAGULATION CONSULT NOTE  Pharmacy Consult for heparin infusion  Indication: atrial fibrillation  Patient Measurements: Height: 6' 3.98" (193 cm) Weight: 100.2 kg (220 lb 14.4 oz) IBW/kg (Calculated) : 86.76  Vital Signs: Temp: 98.4 F (36.9 C) (05/30 2004) Temp Source: Oral (05/30 1503) BP: 151/90 (05/30 2004) Pulse Rate: 73 (05/30 2004)  Labs: Recent Labs    10/11/20 0552 10/11/20 2102 10/12/20 0305 10/13/20 0632 10/13/20 1705  HGB 9.4*  --  9.8* 9.8*  --   HCT 29.6*  --  30.1* 30.7*  --   PLT 272  --  241 256  --   HEPARINUNFRC  --    < > 0.35 0.21* 0.30  CREATININE 1.97*  --  1.83* 1.82*  --    < > = values in this interval not displayed.    Estimated Creatinine Clearance: 49.7 mL/min (A) (by C-G formula based on SCr of 1.82 mg/dL (H)).   Medical History: Past Medical History:  Diagnosis Date  . Cancer [facility])    Bladder Cancer  . Erectile dysfunction associated with type 2 diabetes mellitus ([place]) 05/26/2016  . Gross hematuria 11/16/2016  . Hypertension 06/11/2015  . Type 2 diabetes mellitus with diabetic polyneuropathy, without long-term current use of insulin ([place]) 06/11/2015    Assessment: 64 yo male  with history of hypertension, bladder cancer, diabetes type 2, acute cholecystitis with cholecystectomy tube placed July 10 2020, followed by robotic assisted cholecystectomy September 08, 2020, in the intensive care unit with septic shock, bacteremia, large abdominal abscess, multiorgan failure with complications of severe metabolic acidosis,  atrial fibrillation, acute renal failure with ATN from sepsis and started on hemodialysis. Pt was previously on SQ heparin for DVT ppx. Pharmacy has been consulted for heparin dosing and monitoring until Eliquis can be started once biliary drain managed. H&H, platelets stable.  5/30 0757 HL = 0.21, increased to 1800 units/hr 5/30 1705 HL = 0.30, therapeutic x 1   Goal of Therapy:  Heparin level 0.3-0.7 units/ml Monitor  platelets by anticoagulation protocol: Yes   Plan:   Heparin therapeutic x 1 continue heparin infusion rate at 1800 units/hr  Re-check confirmatory anti-Xa level in 6 hours  Daily CBC while on IV heparin per protocol    Dorothe Pea, PharmD, BCPS 10/13/2020 9:19 PM

## 2020-10-13 NOTE — Progress Notes (Signed)
[facility]  PROGRESS NOTE   Subjective:   Patient seen at bedside.  Family in attendance. Patient states he feels much better and he ate well today.  Objective:  Vital signs in last 24 hours:  Temp:  [97.7 F (36.5 C)-98.1 F (36.7 C)] 98.1 F (36.7 C) (05/30 1202) Pulse Rate:  [59-84] 70 (05/30 1202) Resp:  [16-19] 18 (05/30 1202) BP: (132-170)/(74-96) 145/88 (05/30 1202) SpO2:  [93 %-99 %] 97 % (05/30 1202) Weight:  [100.2 kg-102.1 kg] 100.2 kg (05/30 1100)  Weight change:  Filed Weights   10/11/20 0450 10/13/20 0516 10/13/20 1100  Weight: 108 kg 102.1 kg 100.2 kg    Intake/Output: I/O last 3 completed shifts: In: 2930 [P.O.:240; I.V.:2476.7; Other:10; IV Piggyback:203.3] Out: 9373 [SKAJG:8115; Drains:40]   Intake/Output this shift:  Total I/O In: 360 [P.O.:360] Out: -   Physical Exam: General:  No acute distress  Head:  Normocephalic, atraumatic. Moist oral mucosal membranes  Eyes:  Anicteric  Neck:  Supple  Lungs:   Clear to auscultation, normal effort  Heart:  S1S2 no rubs  Abdomen:   Soft, nontender, bowel sounds present  Extremities:  peripheral edema.  Neurologic:  Awake, alert, following commands  Skin:  No lesions  Access:     Basic Metabolic Panel: Recent Labs  Lab 10/07/20 0331 10/08/20 0428 10/08/20 1010 10/09/20 0445 10/10/20 0644 10/11/20 0552 10/12/20 0305 10/13/20 0632  NA 147*  --  150* 149* 144 144 141 142  K 3.8  --  3.6 3.3* 3.5 3.3* 3.9 3.5  CL 111  --  115* 114* 111 111 110 110  CO2 24  --  24 23 23 23 23  21*  GLUCOSE 170*  --  119* 128* 125* 102* 124* 94  BUN 68*  --  65* 59* 50* 40* 33* 24*  CREATININE 2.60*  --  2.36* 2.08* 2.01* 1.97* 1.83* 1.82*  CALCIUM 8.0*  --  8.3* 8.3* 8.0* 8.3* 8.3* 8.3*  MG 2.1   < >  --  2.1 1.9 1.9 2.1 2.0  PHOS 4.1  --  4.5 4.2  --   --   --   --    < > = values in this interval not displayed.    Liver Function Tests: Recent Labs  Lab 10/07/20 0331 10/08/20 1010  10/09/20 0445  AST 64*  --   --   ALT 64*  --   --   ALKPHOS 156*  --   --   BILITOT 1.2  --   --   PROT 6.8  --   --   ALBUMIN 2.3*  2.3* 2.3* 2.4*   No results for input(s): LIPASE, AMYLASE in the last 168 hours. No results for input(s): AMMONIA in the last 168 hours.  CBC: Recent Labs  Lab 10/09/20 0445 10/10/20 0644 10/11/20 0552 10/12/20 0305 10/13/20 0632  WBC 10.3 8.1 6.9 6.6 6.1  HGB 10.3* 9.1* 9.4* 9.8* 9.8*  HCT 32.9* 28.4* 29.6* 30.1* 30.7*  MCV 86.4 85.3 85.1 83.8 83.7  PLT 296 268 272 241 256    Cardiac Enzymes: No results for input(s): CKTOTAL, CKMB, CKMBINDEX, TROPONINI in the last 168 hours.  BNP: Invalid input(s): POCBNP  CBG: Recent Labs  Lab 10/12/20 1156 10/12/20 1634 10/12/20 2055 10/13/20 0745 10/13/20 1203  GLUCAP 145* 127* 92 100* 31*    Microbiology: Results for orders placed or performed during the hospital encounter of 09/25/20  Culture, blood (routine x 2)     Status:  Abnormal   Collection Time: 09/25/20  2:41 PM   Specimen: BLOOD  Result Value Ref Range Status   Specimen Description   Final    BLOOD RIGHT ANTECUBITAL Performed at [facility], [address] [place], [place] [address]    Special Requests   Final    BOTTLES DRAWN AEROBIC AND ANAEROBIC Blood Culture results may not be optimal due to an excessive volume of blood received in culture bottles Performed at [facility], [address] [place], [place] [address]    Culture  Setup Time   Final    Organism ID to follow GRAM POSITIVE COCCI IN BOTH AEROBIC AND ANAEROBIC BOTTLES CRITICAL RESULT CALLED TO, READ BACK BY AND VERIFIED WITH: LISA KLUTTZ @0802  ON 09/26/20.[place] Performed at [facility], [address] [place], [place] [address]    Culture ENTEROCOCCUS FAECALIS (A)  Final   Report Status 09/28/2020 FINAL  Final   Organism ID, Bacteria ENTEROCOCCUS FAECALIS  Final      Susceptibility   Enterococcus faecalis - MIC*     AMPICILLIN <=2 SENSITIVE Sensitive     VANCOMYCIN 1 SENSITIVE Sensitive     GENTAMICIN SYNERGY SENSITIVE Sensitive     * ENTEROCOCCUS FAECALIS  Blood Culture ID Panel (Reflexed)     Status: Abnormal   Collection Time: 09/25/20  2:41 PM  Result Value Ref Range Status   Enterococcus faecalis DETECTED (A) NOT DETECTED Final    Comment: CRITICAL RESULT CALLED TO, READ BACK BY AND VERIFIED WITH: LISA KLUTTZ @0802  ON 09/26/20.[facility]    Enterococcus Faecium NOT DETECTED NOT DETECTED Final   Listeria monocytogenes NOT DETECTED NOT DETECTED Final   Staphylococcus species NOT DETECTED NOT DETECTED Final   Staphylococcus aureus (BCID) NOT DETECTED NOT DETECTED Final   Staphylococcus epidermidis NOT DETECTED NOT DETECTED Final   Staphylococcus lugdunensis NOT DETECTED NOT DETECTED Final   Streptococcus species NOT DETECTED NOT DETECTED Final   Streptococcus agalactiae NOT DETECTED NOT DETECTED Final   Streptococcus pneumoniae NOT DETECTED NOT DETECTED Final   Streptococcus pyogenes NOT DETECTED NOT DETECTED Final   A.calcoaceticus-baumannii NOT DETECTED NOT DETECTED Final   Bacteroides fragilis DETECTED (A) NOT DETECTED Final    Comment: CRITICAL RESULT CALLED TO, READ BACK BY AND VERIFIED WITH: LISA KLUTTZ @0802  ON 09/26/20.[facility]    Enterobacterales NOT DETECTED NOT DETECTED Final   Enterobacter cloacae complex NOT DETECTED NOT DETECTED Final   Escherichia coli NOT DETECTED NOT DETECTED Final   Klebsiella aerogenes NOT DETECTED NOT DETECTED Final   Klebsiella oxytoca NOT DETECTED NOT DETECTED Final   Klebsiella pneumoniae NOT DETECTED NOT DETECTED Final   Proteus species NOT DETECTED NOT DETECTED Final   Salmonella species NOT DETECTED NOT DETECTED Final   Serratia marcescens NOT DETECTED NOT DETECTED Final   Haemophilus influenzae NOT DETECTED NOT DETECTED Final   Neisseria meningitidis NOT DETECTED NOT DETECTED Final   Pseudomonas aeruginosa NOT DETECTED NOT DETECTED Final   Stenotrophomonas  maltophilia NOT DETECTED NOT DETECTED Final   Candida albicans NOT DETECTED NOT DETECTED Final   Candida auris NOT DETECTED NOT DETECTED Final   Candida glabrata NOT DETECTED NOT DETECTED Final   Candida krusei NOT DETECTED NOT DETECTED Final   Candida parapsilosis NOT DETECTED NOT DETECTED Final   Candida tropicalis NOT DETECTED NOT DETECTED Final   Cryptococcus neoformans/gattii NOT DETECTED NOT DETECTED Final   Vancomycin resistance NOT DETECTED NOT DETECTED Final    Comment: Performed at [facility], [place], [place]  [address]  Culture, blood (routine x 2)     Status: None   Collection Time: 09/25/20  3:42 PM   Specimen: BLOOD  Result Value Ref Range Status   Specimen Description BLOOD BLOOD LEFT HAND  Final   Special Requests   Final    BOTTLES DRAWN AEROBIC AND ANAEROBIC Blood Culture adequate volume   Culture   Final    NO GROWTH 5 DAYS Performed at [facility], [address] [place], [place] [address]    Report Status 09/30/2020 FINAL  Final  Resp Panel by RT-PCR (Flu A&B, Covid) Nasopharyngeal Swab     Status: None   Collection Time: 09/25/20  3:51 PM   Specimen: Nasopharyngeal Swab; Nasopharyngeal(NP) swabs in vial transport medium  Result Value Ref Range Status   SARS Coronavirus 2 by RT PCR NEGATIVE NEGATIVE Final    Comment: (NOTE) SARS-CoV-2 target nucleic acids are NOT DETECTED.  The SARS-CoV-2 RNA is generally detectable in upper respiratory specimens during the acute phase of infection. The lowest concentration of SARS-CoV-2 viral copies this assay can detect is 138 copies/mL. A negative result does not preclude SARS-Cov-2 infection and should not be used as the sole basis for treatment or other patient management decisions. A negative result may occur with  improper specimen collection/handling, submission of specimen other than nasopharyngeal swab, presence of viral mutation(s) within the areas targeted by this assay,  and inadequate number of viral copies(<138 copies/mL). A negative result must be combined with clinical observations, patient history, and epidemiological information. The expected result is Negative.  Fact Sheet for Patients:  EntrepreneurPulse.com.au  Fact Sheet for Healthcare Providers:  IncredibleEmployment.be  This test is no t yet approved or cleared by the [place] FDA and  has been authorized for detection and/or diagnosis of SARS-CoV-2 by FDA under an Emergency Use Authorization (EUA). This EUA will remain  in effect (meaning this test can be used) for the duration of the COVID-19 declaration under Section 564(b)(1) of the Act, 21 U.S.C.section 360bbb-3(b)(1), unless the authorization is terminated  or revoked sooner.       Influenza A by PCR NEGATIVE NEGATIVE Final   Influenza B by PCR NEGATIVE NEGATIVE Final    Comment: (NOTE) The Xpert Xpress SARS-CoV-2/FLU/RSV plus assay is intended as an aid in the diagnosis of influenza from Nasopharyngeal swab specimens and should not be used as a sole basis for treatment. Nasal washings and aspirates are unacceptable for Xpert Xpress SARS-CoV-2/FLU/RSV testing.  Fact Sheet for Patients: EntrepreneurPulse.com.au  Fact Sheet for Healthcare Providers: IncredibleEmployment.be  This test is not yet approved or cleared by the [place] FDA and has been authorized for detection and/or diagnosis of SARS-CoV-2 by FDA under an Emergency Use Authorization (EUA). This EUA will remain in effect (meaning this test can be used) for the duration of the COVID-19 declaration under Section 564(b)(1) of the Act, 21 U.S.C. section 360bbb-3(b)(1), unless the authorization is terminated or revoked.  Performed at [facility], [address] [place], [place] [address]   MRSA PCR Screening     Status: None   Collection Time: 09/25/20  6:44 PM   Specimen:  Nasopharyngeal  Result Value Ref Range Status   MRSA by PCR NEGATIVE NEGATIVE Final    Comment:        The GeneXpert MRSA Assay (FDA approved for NASAL specimens only), is one component of a comprehensive MRSA colonization surveillance program. It is not intended to diagnose MRSA infection nor to guide or monitor  treatment for MRSA infections. Performed at [facility], [address] [place], [place] [address]   Aerobic/Anaerobic Culture (surgical/deep wound)     Status: None   Collection Time: 09/25/20  9:02 PM   Specimen: Abscess  Result Value Ref Range Status   Specimen Description   Final    ABSCESS Performed at [facility], [address], [place], [place] [address]    Special Requests   Final    Normal Performed at [place], [place] [address]    Gram Stain   Final    ABUNDANT WBC PRESENT,BOTH PMN AND MONONUCLEAR MODERATE GRAM NEGATIVE COCCOBACILLI    Culture   Final    ABUNDANT ENTEROCOCCUS FAECALIS MODERATE BACTEROIDES FRAGILIS BETA LACTAMASE POSITIVE Performed at [facility], [place] [address] [place], [place] [address]    Report Status 09/28/2020 FINAL  Final   Organism ID, Bacteria ENTEROCOCCUS FAECALIS  Final      Susceptibility   Enterococcus faecalis - MIC*    AMPICILLIN <=2 SENSITIVE Sensitive     VANCOMYCIN 1 SENSITIVE Sensitive     GENTAMICIN SYNERGY SENSITIVE Sensitive     * ABUNDANT ENTEROCOCCUS FAECALIS  Urine culture     Status: None   Collection Time: 09/26/20  1:36 PM   Specimen: Urine, Random  Result Value Ref Range Status   Specimen Description   Final    URINE, RANDOM Performed at [facility], [address], [place], [place] [address]    Special Requests   Final    NONE Performed at [facility], [address], [place], [place] [address]    Culture   Final    NO GROWTH Performed at [facility], [place] [address], [place], [place] [address]     Report Status 09/28/2020 FINAL  Final  Culture, blood (single) w Reflex to ID Panel     Status: None   Collection Time: 09/30/20  3:35 PM   Specimen: BLOOD  Result Value Ref Range Status   Specimen Description BLOOD BLOOD RIGHT HAND  Final   Special Requests   Final    BOTTLES DRAWN AEROBIC AND ANAEROBIC Blood Culture adequate volume   Culture   Final    NO GROWTH 5 DAYS Performed at [facility], [address], [place], [place] [address]    Report Status 10/05/2020 FINAL  Final    Coagulation Studies: No results for input(s): LABPROT, INR in the last 72 hours.  Urinalysis: No results for input(s): COLORURINE, LABSPEC, PHURINE, GLUCOSEU, HGBUR, BILIRUBINUR, KETONESUR, PROTEINUR, UROBILINOGEN, NITRITE, LEUKOCYTESUR in the last 72 hours.  Invalid input(s): APPERANCEUR    Imaging: No results found.   Medications:   . sodium chloride Stopped (10/09/20 1150)  . sodium chloride    . sodium chloride Stopped (10/04/20 1112)  . ampicillin-sulbactam (UNASYN) IV 3 g (10/13/20 1259)  . heparin 1,800 Units/hr (10/13/20 1252)   . [START ON 10/14/2020] amLODipine  10 mg Oral Daily  . Chlorhexidine Gluconate Cloth  6 each Topical Daily  . feeding supplement  237 mL Oral TID BM  . insulin aspart  0-20 Units Subcutaneous TID WC  . insulin aspart  0-5 Units Subcutaneous QHS  . metoprolol tartrate  25 mg Oral BID  . multivitamin  1 tablet Oral QHS  . multivitamin with minerals  1 tablet Oral Daily  . pantoprazole (PROTONIX) IV  40 mg Intravenous Q24H  . sodium chloride flush  3 mL Intravenous  Q12H    Assessment/ Plan:     Principal Problem:   Sepsis with acute renal failure and septic shock (HCC) Active Problems:   Atrial fibrillation (HCC)   Altered mental status   Abscess   Acute kidney failure, unspecified ([place])   Encounter for nasogastric (NG) tube placement   Acute respiratory failure (HCC)   Postprocedural intraabdominal abscess   Protein-calorie  malnutrition, severe   Pressure injury of skin  65 year old white male with history of hypertension, bladder cancer, diabetes, peripheral vascular disease, now admitted with history of septic shock associated with acute kidney injury on top of chronic kidney disease.  Sepsis: With a history of enterococcal faecalis and bacteroids fragilis sepsis. Now improving with IV antibiotics.  Acute kidney injury on the top of chronic kidney disease. Patient had a baseline creatinine of 1.5. Presently the creatinine has been improving slowly but steadily.  Hypokalemia: Improved with supplementation.  Spoke to the family at bedside in detail and discussed with them regarding the importance of outpatient follow-up. We will follow closely during the hospitalization and give recommendations on regular basis.   LOS: [place], [facility] 5/30/20222:27 PM

## 2020-10-13 NOTE — Progress Notes (Signed)
Progress Note  Patient Name: Richard Oconnell Date of Encounter: 10/13/2020  [facility] Cardiologist:   Subjective   No family at the bedside this morning Long discussion with nurses, he had to go to the bathroom hit the nursing call button got up before nursing could get there to assist him, had a fall, slipped down the wall Did not hurt himself -Reports he should have waited, realizes his legs are weak Walking with a walker Otherwise no complaints this morning, Still has output from the drain but no abdominal pain  Telemetry reviewed, maintaining normal sinus rhythm  Inpatient Medications    Scheduled Meds: . amLODipine  5 mg Oral Daily  . Chlorhexidine Gluconate Cloth  6 each Topical Daily  . feeding supplement  237 mL Oral TID BM  . insulin aspart  0-20 Units Subcutaneous TID WC  . insulin aspart  0-5 Units Subcutaneous QHS  . metoprolol tartrate  25 mg Oral BID  . multivitamin  1 tablet Oral QHS  . multivitamin with minerals  1 tablet Oral Daily  . pantoprazole (PROTONIX) IV  40 mg Intravenous Q24H  . sodium chloride flush  3 mL Intravenous Q12H   Continuous Infusions: . sodium chloride Stopped (10/09/20 1150)  . sodium chloride    . sodium chloride Stopped (10/04/20 1112)  . ampicillin-sulbactam (UNASYN) IV 3 g (10/13/20 0512)  . heparin 1,800 Units/hr (10/13/20 0917)   PRN Meds: sodium chloride, acetaminophen, albuterol, docusate, LORazepam, melatonin, metoprolol tartrate, ondansetron (ZOFRAN) IV, polyethylene glycol, sodium chloride flush   Vital Signs    Vitals:   10/12/20 2020 10/13/20 0516 10/13/20 0522 10/13/20 0747  BP: 132/74  (!) 143/86 (!) 146/86  Pulse: (!) 59  71 84  Resp: 18  19 18   Temp: 97.7 F (36.5 C)  97.8 F (36.6 C) 98.1 F (36.7 C)  TempSrc:    Oral  SpO2: 97%  96% 98%  Weight:  102.1 kg    Height:        Intake/Output Summary (Last 24 hours) at 10/13/2020 1056 Last data filed at 10/13/2020 1001 Gross per 24 hour   Intake 365 ml  Output 2450 ml  Net -2085 ml   Last 3 Weights 10/13/2020 10/11/2020 10/10/2020  Weight (lbs) 225 lb 238 lb 3.2 oz 240 lb 11.9 oz  Weight (kg) 102.059 kg 108.047 kg 109.2 kg      Telemetry    Normal sinus rhythm, some bradycardia at nighttime- Personally Reviewed  ECG     - Personally Reviewed  Physical Exam   Constitutional: Alert. No distress.  HENT:  Head: Grossly normal Eyes:  no discharge. No scleral icterus.  Neck: No JVD, no carotid bruits  Cardiovascular: Regular rate and rhythm, no murmurs appreciated Pulmonary/Chest: Clear to auscultation bilaterally, no wheezes or rails Abdominal: Soft.  no distension.  no tenderness.  drain in place Musculoskeletal: Normal range of motion Neurological:  normal muscle tone. Coordination normal. No atrophy Skin: Skin warm and dry Psychiatric: normal affect, pleasant   Labs    High Sensitivity Troponin:  No results for input(s): TROPONINIHS in the last 720 hours.    Chemistry Recent Labs  Lab 10/07/20 0331 10/08/20 1010 10/09/20 0445 10/10/20 0644 10/11/20 0552 10/12/20 0305 10/13/20 0632  NA 147* 150* 149*   < > 144 141 142  K 3.8 3.6 3.3*   < > 3.3* 3.9 3.5  CL 111 115* 114*   < > 111 110 110  CO2 24 24 23    < >  23 23 21*  GLUCOSE 170* 119* 128*   < > 102* 124* 94  BUN 68* 65* 59*   < > 40* 33* 24*  CREATININE 2.60* 2.36* 2.08*   < > 1.97* 1.83* 1.82*  CALCIUM 8.0* 8.3* 8.3*   < > 8.3* 8.3* 8.3*  PROT 6.8  --   --   --   --   --   --   ALBUMIN 2.3*  2.3* 2.3* 2.4*  --   --   --   --   AST 64*  --   --   --   --   --   --   ALT 64*  --   --   --   --   --   --   ALKPHOS 156*  --   --   --   --   --   --   BILITOT 1.2  --   --   --   --   --   --   GFRNONAA 27* 30* 35*   < > 37* 40* 41*  ANIONGAP 12 11 12    < > 10 8 11    < > = values in this interval not displayed.     Hematology Recent Labs  Lab 10/11/20 0552 10/12/20 0305 10/13/20 0632  WBC 6.9 6.6 6.1  RBC 3.48* 3.59* 3.67*  HGB 9.4*  9.8* 9.8*  HCT 29.6* 30.1* 30.7*  MCV 85.1 83.8 83.7  MCH 27.0 27.3 26.7  MCHC 31.8 32.6 31.9  RDW 18.2* 17.8* 18.1*  PLT 272 241 256    BNPNo results for input(s): BNP, PROBNP in the last 168 hours.   DDimer No results for input(s): DDIMER in the last 168 hours.   Radiology    No results found.  Cardiac Studies     Patient Profile     65 y.o.malewith a hx of HTN, DM2, bladder CA, previous admission with acute cholecystitis s/pcholecystotomy tube 2/24 with follow-up robotic assisted cholecystectomy 4/25, admitted 5/12 w/ septic shock, bacteremia, metabolic acidosis, multiorgan failure, and metabolic encephalopathy in the setting of a large abdominal abscess, complicated by paroxysmal afib w/ rvr.  Amio associated w/ bradycardia and jxnl rhythm.  Nesbitt avoided due to draining abscess, lft abnormalities.   Assessment & Plan    Septic shock present on admission--  large abscess intra-abdominal (culture growing Enterococcus faecalis and Bacteroides fragilis) robotic assisted cholecystectomy in April 2022 --on Unasyn and fluconazole -- dispose percutaneous drain placement by IR into gallbladder. Abscess on 09/25/2020 -- drain was exchanged by IR on 5/19 -Drain still in place, output noted, no abdominal tenderness  Paroxysmal atrial fibrillation  CHA2DS2-VASc score 3 Frequent episodes of paroxysmal atrial fibrillation this admission in the setting of sepsis, management challenging secondary to bradycardia/junctional rhythm when metoprolol or amiodarone used Amiodarone previously held for junctional rhythm 2 days ago with atrial fibrillation 6 AM to 1:30 PM when off metoprolol -Placed back on metoprolol tartrate 25 twice daily, appears to be tolerating -Asymptomatic bradycardia at nighttime, we have not been holding the metoprolol and he has been doing well -Would hope to discharge him when ready on metoprolol 25 twice daily to avoid readmission for atrial fibrillation with RVR  off beta-blocker    Abdominal abscess, sepsis  Enterococcal bacteremia  Surgery has been following, drain in place  On broad-spectrum antibiotics,  Confusion this admission with slow improvement   Essential hypertension  Has been on amlodipine 5 with metoprolol tartrate 25 twice daily Blood pressure  elevated = We will increase amlodipine up to 10 daily Will avoid ACE ARB in the setting of renal dysfunction  Chronic renal insufficiency creatinine up to 6.13--- at its peak, likely ATN/sepsis, started on hemodialysis, two treatments. much improved now -Following closely  Anemia In the setting of renal failure Could check iron studies    Total encounter time more than 35 minutes Greater than 50% was spent in counseling and coordination of care with the patient  For questions or updates, please contact [place] Please consult [email or web address] for contact info under        Signed, Ida Rogue, MD  10/13/2020, 10:56 AM

## 2020-10-14 ENCOUNTER — Other Ambulatory Visit: Payer: Self-pay | Admitting: Radiology

## 2020-10-14 DIAGNOSIS — L0291 Cutaneous abscess, unspecified: Secondary | ICD-10-CM

## 2020-10-14 DIAGNOSIS — I480 Paroxysmal atrial fibrillation: Secondary | ICD-10-CM | POA: Diagnosis not present

## 2020-10-14 DIAGNOSIS — N170 Acute kidney failure with tubular necrosis: Secondary | ICD-10-CM | POA: Diagnosis not present

## 2020-10-14 DIAGNOSIS — N189 Chronic kidney disease, unspecified: Secondary | ICD-10-CM

## 2020-10-14 DIAGNOSIS — A419 Sepsis, unspecified organism: Secondary | ICD-10-CM | POA: Diagnosis not present

## 2020-10-14 DIAGNOSIS — N179 Acute kidney failure, unspecified: Secondary | ICD-10-CM | POA: Diagnosis not present

## 2020-10-14 DIAGNOSIS — R6521 Severe sepsis with septic shock: Secondary | ICD-10-CM | POA: Diagnosis not present

## 2020-10-14 LAB — MAGNESIUM: Magnesium: 1.9 mg/dL (ref 1.7–2.4)

## 2020-10-14 LAB — BASIC METABOLIC PANEL
Anion gap: 11 (ref 5–15)
BUN: 22 mg/dL (ref 8–23)
CO2: 23 mmol/L (ref 22–32)
Calcium: 8.5 mg/dL — ABNORMAL LOW (ref 8.9–10.3)
Chloride: 109 mmol/L (ref 98–111)
Creatinine, Ser: 1.82 mg/dL — ABNORMAL HIGH (ref 0.61–1.24)
GFR, Estimated: 41 mL/min — ABNORMAL LOW (ref >60)
Glucose, Bld: 101 mg/dL — ABNORMAL HIGH (ref 70–99)
Potassium: 3.3 mmol/L — ABNORMAL LOW (ref 3.5–5.1)
Sodium: 143 mmol/L (ref 135–145)

## 2020-10-14 LAB — CBC
HCT: 31.4 % — ABNORMAL LOW (ref 39.0–52.0)
Hemoglobin: 10.2 g/dL — ABNORMAL LOW (ref 13.0–17.0)
MCH: 27.3 pg (ref 26.0–34.0)
MCHC: 32.5 g/dL (ref 30.0–36.0)
MCV: 84 fL (ref 80.0–100.0)
Platelets: 233 10*3/uL (ref 150–400)
RBC: 3.74 MIL/uL — ABNORMAL LOW (ref 4.22–5.81)
RDW: 18.2 % — ABNORMAL HIGH (ref 11.5–15.5)
WBC: 6.3 10*3/uL (ref 4.0–10.5)
nRBC: 0 % (ref 0.0–0.2)

## 2020-10-14 LAB — GLUCOSE, CAPILLARY
Glucose-Capillary: 112 mg/dL — ABNORMAL HIGH (ref 70–99)
Glucose-Capillary: 188 mg/dL — ABNORMAL HIGH (ref 70–99)
Glucose-Capillary: 138 mg/dL — ABNORMAL HIGH (ref 70–99)
Glucose-Capillary: 123 mg/dL — ABNORMAL HIGH (ref 70–99)

## 2020-10-14 LAB — HEPARIN LEVEL (UNFRACTIONATED): Heparin Unfractionated: 0.42 IU/mL (ref 0.30–0.70)

## 2020-10-14 MED ORDER — AMOXICILLIN-POT CLAVULANATE 875-125 MG PO TABS
1.0000 | ORAL_TABLET | Freq: Two times a day (BID) | ORAL | Status: DC
  Administered 2020-10-14 – 2020-10-15 (×2): 1 via ORAL
  Filled 2020-10-14 (×2): qty 1

## 2020-10-14 MED ORDER — METOPROLOL TARTRATE 25 MG PO TABS: 25.0000 mg | ORAL_TABLET | Freq: Two times a day (BID) | ORAL | Status: DC

## 2020-10-14 MED ORDER — AMOXICILLIN-POT CLAVULANATE 875-125 MG PO TABS: 1.0000 | ORAL_TABLET | Freq: Two times a day (BID) | ORAL | Status: DC

## 2020-10-14 MED ORDER — POTASSIUM CHLORIDE CRYS ER 20 MEQ PO TBCR
20.0000 meq | EXTENDED_RELEASE_TABLET | Freq: Every day | ORAL | Status: DC
  Administered 2020-10-15: 20 meq via ORAL
  Filled 2020-10-14: qty 1

## 2020-10-14 MED ORDER — AMLODIPINE BESYLATE 10 MG PO TABS: 10.0000 mg | ORAL_TABLET | Freq: Every day | ORAL | Status: DC

## 2020-10-14 MED ORDER — APIXABAN 5 MG PO TABS
5.0000 mg | ORAL_TABLET | Freq: Two times a day (BID) | ORAL | Status: DC
  Administered 2020-10-14 – 2020-10-15 (×2): 5 mg via ORAL
  Filled 2020-10-14 (×2): qty 1

## 2020-10-14 MED ORDER — POTASSIUM CHLORIDE CRYS ER 20 MEQ PO TBCR
40.0000 meq | EXTENDED_RELEASE_TABLET | Freq: Once | ORAL | Status: AC
  Administered 2020-10-14: 40 meq via ORAL
  Filled 2020-10-14: qty 2

## 2020-10-14 MED ORDER — APIXABAN 5 MG PO TABS: 5.0000 mg | ORAL_TABLET | Freq: Two times a day (BID) | ORAL | Status: DC

## 2020-10-14 NOTE — Progress Notes (Signed)
Physical Therapy Treatment Patient Details Name: Richard Oconnell MRN: [id number] DOB: [birth year] Today's Date: 10/14/2020    History of Present Illness 65 yo male with previous dx of acute cholecystitis s/p cholecystotomy tube 2/24 and then follow up robotic assisted cholecystectomy 4/25, now presents with large abdominal abscess with severe septic shock and BACTEREMIA, with multiorgan failure (liver and renal failure) complicated by severe metabolic acidosis and toxic metabolic encephalopathy.    PT Comments    Patient is making progress towards meeting PT goals. Patient continues to require assistance with bed mobility, transfers, and ambulating a short distance with rolling walker. Patient is generally deconditioned with activity tolerance limited by fatigue. He needs SNF placement for ongoing PT efforts to maximize independence and facilitate return to prior level of function. PT will continue to follow.     Follow Up Recommendations  SNF     Equipment Recommendations   (to be determined at next level of care)    Recommendations for Other Services       Precautions / Restrictions Precautions Precautions: Fall Restrictions Weight Bearing Restrictions: No    Mobility  Bed Mobility Overal bed mobility: Needs Assistance Bed Mobility: Supine to Sit     Supine to sit: Mod assist     General bed mobility comments: assistance for trunk support. verbal cues for technique    Transfers Overall transfer level: Needs assistance Equipment used: Rolling walker (2 wheeled) Transfers: Sit to/from Stand Sit to Stand: Min assist;From elevated surface         General transfer comment: lifting assistance provided for standing. patient needs maximal cues for hand placement and technique to facilitate independence with standing  Ambulation/Gait Ambulation/Gait assistance: Min assist Gait Distance (Feet): 5 Feet Assistive device: Rolling walker (2 wheeled) Gait  Pattern/deviations: Step-to pattern Gait velocity: decreased   General Gait Details: patient is unsteady and needs steadying assistance for ambulating a short distance using rolling walker   Stairs             Wheelchair Mobility    Modified Rankin (Stroke Patients Only)       Balance Overall balance assessment: Needs assistance Sitting-balance support: Bilateral upper extremity supported;Feet supported Sitting balance-Leahy Scale: Good                                      Cognition Arousal/Alertness: Awake/alert Behavior During Therapy: WFL for tasks assessed/performed Overall Cognitive Status: No family/caregiver present to determine baseline cognitive functioning                                 General Comments: patient has tangential speech at times, needs frequent cues for re-direction and attention to task      Exercises      General Comments        Pertinent Vitals/Pain Pain Assessment: No/denies pain    Home Living                      Prior Function            PT Goals (current goals can now be found in the care plan section) Acute Rehab PT Goals Patient Stated Goal: to regain leg strength PT Goal Formulation: With patient Progress towards PT goals: Progressing toward goals    Frequency    Min 2X/week  PT Plan Current plan remains appropriate    Co-evaluation              AM-PAC PT "6 Clicks" Mobility   Outcome Measure  Help needed turning from your back to your side while in a flat bed without using bedrails?: A Lot Help needed moving from lying on your back to sitting on the side of a flat bed without using bedrails?: A Lot Help needed moving to and from a bed to a chair (including a wheelchair)?: A Lot Help needed standing up from a chair using your arms (e.g., wheelchair or bedside chair)?: A Lot Help needed to walk in hospital room?: A Lot Help needed climbing 3-5 steps with a  railing? : A Lot 6 Click Score: 12    End of Session   Activity Tolerance: Patient tolerated treatment well Patient left: in bed;with call bell/phone within reach;with chair alarm set;with nursing/sitter in room Nurse Communication: Mobility status PT Visit Diagnosis: Unsteadiness on feet (R26.81);Muscle weakness (generalized) (M62.81);Difficulty in walking, not elsewhere classified (R26.2)     Time: 9987-2158 PT Time Calculation (min) (ACUTE ONLY): 29 min  Charges:  $Therapeutic Activity: 23-37 mins                    Minna Merritts, PT, MPT    Percell Locus 10/14/2020, 9:06 AM

## 2020-10-14 NOTE — Progress Notes (Signed)
CT abdomen done yesterday shows decrease in the gall bladder fossa abscess Afebril X 7 days WBC normalized DC unasyn and change to Augmentin PO for 7 more days Follow up with surgery/IR for removal of drain after repeating CT in 1 week Discussed with patient and hospitlaist ID will sign off- call if needed

## 2020-10-14 NOTE — Progress Notes (Signed)
ANTICOAGULATION CONSULT NOTE  Pharmacy Consult for heparin infusion  Indication: atrial fibrillation  Patient Measurements: Height: 6' 3.98" (193 cm) Weight: 100.2 kg (220 lb 14.4 oz) IBW/kg (Calculated) : 86.76  Vital Signs: Temp: 98.4 F (36.9 C) (05/30 2004) Temp Source: Oral (05/30 1503) BP: 151/90 (05/30 2004) Pulse Rate: 73 (05/30 2004)  Labs: Recent Labs    10/11/20 0552 10/11/20 2102 10/12/20 0305 10/13/20 6384 10/13/20 1705 10/13/20 2307  HGB 9.4*  --  9.8* 9.8*  --   --   HCT 29.6*  --  30.1* 30.7*  --   --   PLT 272  --  241 256  --   --   HEPARINUNFRC  --    < > 0.35 0.21* 0.30 0.35  CREATININE 1.97*  --  1.83* 1.82*  --   --    < > = values in this interval not displayed.    Estimated Creatinine Clearance: 49.7 mL/min (A) (by C-G formula based on SCr of 1.82 mg/dL (H)).   Medical History: Past Medical History:  Diagnosis Date  . Cancer [facility])    Bladder Cancer  . Erectile dysfunction associated with type 2 diabetes mellitus ([place]) 05/26/2016  . Gross hematuria 11/16/2016  . Hypertension 06/11/2015  . Type 2 diabetes mellitus with diabetic polyneuropathy, without long-term current use of insulin ([place]) 06/11/2015    Assessment: 65 yo male  with history of hypertension, bladder cancer, diabetes type 2, acute cholecystitis with cholecystectomy tube placed July 10 2020, followed by robotic assisted cholecystectomy September 08, 2020, in the intensive care unit with septic shock, bacteremia, large abdominal abscess, multiorgan failure with complications of severe metabolic acidosis,  atrial fibrillation, acute renal failure with ATN from sepsis and started on hemodialysis. Pt was previously on SQ heparin for DVT ppx. Pharmacy has been consulted for heparin dosing and monitoring until Eliquis can be started once biliary drain managed. H&H, platelets stable.  5/30 0757 HL = 0.21, increased to 1800 units/hr 5/30 1705 HL = 0.30, therapeutic x 1  5/30 2307 HL = 0.35,  therapeutic x 2  Goal of Therapy:  Heparin level 0.3-0.7 units/ml Monitor platelets by anticoagulation protocol: Yes   Plan:   Heparin therapeutic x 1 continue heparin infusion rate at 1800 units/hr  Re-check HL daily with AM labs  Daily CBC while on IV heparin per protocol     Renda Rolls, PharmD, [facility] 10/14/2020 12:17 AM

## 2020-10-14 NOTE — Discharge Summary (Addendum)
Physician Discharge Summary  Richard Oconnell LNL:[id number] DOB: [birth year] DOA: 09/25/2020  PCP: [name], DO  Admit date: 09/25/2020 Discharge date: 10/15/2020  Admitted From: Home Disposition: Skilled nursing facility  Recommendations for Outpatient Follow-up:  1. Follow up with PCP in 1-2 weeks 2. Please obtain BMP/CBC in one week 3. Follow-up with nephrology in 2 weeks 4. Follow-up with interventional radiology drain clinic in 2 weeks for repeat imaging, referral has been made 5. Follow-up with infectious disease in 2 weeks, referral has been made 6. Follow-up with cardiology, Dr. Rockey Situ in 3 to 4 weeks 7. Patient's abdominal drain will need to be flushed with 5 cc of saline 3 times daily, record output on a daily basis   Discharge Condition: Stable CODE STATUS: Full code Diet recommendation: Heart healthy  Brief/Interim Summary: 65 year old gentleman with a history of diabetes, acute cholecystitis with cholecystostomy tube placed in February 2022, followed by cholecystectomy on 09/08/2020.  He was admitted with a large abdominal abscess, associated bacteremia and septic shock related to abscess.  He also developed acute renal failure with ATN requiring transient hemodialysis.  Discharge Diagnoses:  Principal Problem:   Sepsis with acute renal failure and septic shock (HCC) Active Problems:   Atrial fibrillation (HCC)   Altered mental status   Abscess   Acute kidney failure, unspecified ([place])   Encounter for nasogastric (NG) tube placement   Acute respiratory failure (HCC)   Postprocedural intraabdominal abscess   Protein-calorie malnutrition, severe   Pressure injury of skin   Acute hypoxic respiratory failure in the setting of severe metabolic encephalopathy due to septic shock, acute kidney injury secondary to ATN in the setting of sepsis -- patient was intubated and remained on the ventilator for several days. -- Extubated on 09/30/20 now on room  air -- PRN bronchodilators, pulmonary toilet.  Septic shock present on admission-- resolved source large abscess intra-abdominal (culture growing Enterococcus faecalis and Bacteroides fragilis) history of acute cholecystitis with cholecystostomy July 06, 2008 followed by robotic assisted cholecystectomy in April 2022 -- WBC now normal -- followed by ID.  He had prolonged course of antibiotics with Unasyn -- s/p percutaneous drain placement by IR into gallbladder. Abscess on 09/25/2020 -- drain was exchanged by IR on 5/19, currently having 15cc output per day -- was followed by general surgery, now signed off --CT scan of the abdomen and pelvis was repeated prior to discharge that showed persistent collection (although smaller than prior imaging).  Reviewed with interventional radiology, Dr. Serafina Royals who agreed that drain should remain in place for the time being for at least 2 weeks.  He will follow-up with interventional radiology drain clinic in the next 2 weeks for reimaging and consideration of removal if fluid collection has improved -Discussed with infectious disease, Dr. Delaine Lame and since patient is afebrile and has normal WBC count, Unasyn will be transitioned to Augmentin.  We will continue for another 2 weeks until he follows up for repeat imaging. -Patient's drain should be flushed with 5 cc of saline 3 times a day.  Acute kidney injury in the setting of sepsis due to ATN -- patient had creatinine up to 6.13--- started on hemodialysis. Got two HD treatments. -- Creatinine trending down 1.83-- Followed by nephrology. -- Good urine output. Dialysis on hold. -  baseline creatinine 1.5 --Will need to follow-up with nephrology in 2 weeks  Acute metabolic encephalopathy/toxic in the setting of sepsis AK I -- Patient had prolonged episode of being confused/delirious, likely metabolic from renal failure,  electrolyte abnormalities and icu delirium -- MRI brain showed no acute  abnormality. Chronic microvascular disease -- Mental status has improved and is at baseline  A fib with RVR -- followed by Dr. Rockey Situ [facility] -- 5/23-- was on Cardizem gtt for few hours--now off.  --developed rapid a fib again on 5/27 --metoprolol being adjusted for rate control --Heart rate has since stabilized -- He is anticoagulated with Eliquis --Will need outpatient follow-up with cardiology in 3 to 4 weeks  Anemia without sign symptoms of obvious bleeding secondary to chronic illness -- transfuse as needed  Nutrition --5/24-- seen by speech therapist. NG tube removed. Swallow eval done at bedside.  -Initially on dysphagia 1 with nectar thick liquids -overall improved and diet now advanced to regular diet with thin liquids  Hypernatremia -Resolved with hypotonic fluid  Hypokalemia -replaced   Pressure injury Pressure Injury Coccyx Medial Stage 2 -  Partial thickness loss of dermis presenting as a shallow open injury with a red, pink wound bed without slough. appears as a burn and the skin is sloughing off possibly macerated (Active)     Location: Coccyx  Location Orientation: Medial  Staging: Stage 2 -  Partial thickness loss of dermis presenting as a shallow open injury with a red, pink wound bed without slough.  Wound Description (Comments): appears as a burn and the skin is sloughing off possibly macerated  Present on Admission: Yes  continue supportive care    Discharge Instructions   Allergies as of 10/14/2020      Reactions   Cyclobenzaprine Other (See Comments)   Not improving pain and makes him agressive.   Lisinopril Cough      Medication List    STOP taking these medications   diphenhydramine-acetaminophen 25-500 MG Tabs tablet Commonly known as: TYLENOL PM   ibuprofen 400 MG tablet Commonly known as: ADVIL   oxyCODONE 5 MG immediate release tablet Commonly known as: Oxy IR/ROXICODONE   zolpidem 5 MG tablet Commonly known as:  AMBIEN     TAKE these medications   acetaminophen 325 MG tablet Commonly known as: TYLENOL Take 1 tablet (325 mg total) by mouth every 6 (six) hours as needed for mild pain, fever or headache (or Fever >/= 101).   amLODipine 10 MG tablet Commonly known as: NORVASC Take 1 tablet (10 mg total) by mouth daily. Start taking on: October 15, 2020   amoxicillin-clavulanate 875-125 MG tablet Commonly known as: AUGMENTIN Take 1 tablet by mouth every 12 (twelve) hours. For 2 weeks   apixaban 5 MG Tabs tablet Commonly known as: ELIQUIS Take 1 tablet (5 mg total) by mouth 2 (two) times daily.   metoprolol tartrate 25 MG tablet Commonly known as: LOPRESSOR Take 1 tablet (25 mg total) by mouth 2 (two) times daily. Start taking on: October 15, 2020 What changed:   how much to take  when to take this   Ozempic (0.25 or 0.5 MG/DOSE) 2 MG/1.5ML Sopn Generic drug: Semaglutide(0.25 or 0.5MG /DOS) Inject 1 mg into the skin once a week. What changed: when to take this   pravastatin 20 MG tablet Commonly known as: PRAVACHOL Take 1 tablet (20 mg total) by mouth daily.       Follow-up Information    Arne Cleveland, MD. Go in 2 week(s).   Specialties: Interventional Radiology, Radiology Why: Clinic will call you to schedule follow up appointment Contact information: [address] [place] [address] [phone]  Allergies  Allergen Reactions  . Cyclobenzaprine Other (See Comments)    Not improving pain and makes him agressive.  . Lisinopril Cough    Consultations:  PCCM  Cardiology  Nephrology  Infectious disease  Palliative care   Procedures/Studies: CT ABDOMEN PELVIS WO CONTRAST  Result Date: 10/13/2020 CLINICAL DATA:  Status post cholecystectomy on 09/08/2020. Subsequent abscesses with percutaneous drain placement. EXAM: CT ABDOMEN AND PELVIS WITHOUT CONTRAST TECHNIQUE: Multidetector CT imaging of the abdomen and pelvis was performed  following the standard protocol without IV contrast. COMPARISON:  Sep 30, 2020 FINDINGS: Lower chest: Mild hypoventilatory changes. Coronary and aortic atherosclerosis. Hepatobiliary: Pigtail catheter along the gallbladder fossa is situated within gas and fluid collection measuring approximately 5.4 x 2.7 x 4.0 cm, decreased from the prior measurement of 6.8 x 3.6 x 5.2 cm. High density elements are again seen in the medial aspect of this collection. Minimal perihepatic free fluid. Pancreas: Unremarkable. No pancreatic ductal dilatation or surrounding inflammatory changes. Spleen: Normal in size without focal abnormality. Adrenals/Urinary Tract: Normal adrenal glands. Bilateral perirenal fat stranding. 2 cm probable left renal cyst. Normal gallbladder. Stomach/Bowel: Stomach is within normal limits. No evidence of appendicitis. No evidence of bowel wall thickening, distention, or inflammatory changes. Sigmoid diverticulosis without evidence of diverticulitis. Vascular/Lymphatic: Aortic atherosclerosis. No enlarged abdominal or pelvic lymph nodes. Reproductive: Prostate is unremarkable. Other: Persistent subcutaneous edema in the lateral abdominal wall and hips. Mild presacral edema. Musculoskeletal: Spondylosis of the thoracolumbar spine. In seen is nonspecific rib sclerotic lesion in the lateral left 8th rib. IMPRESSION: 1. Pigtail catheter along the gallbladder fossa is situated within gas and fluid collection measuring approximately 5.4 x 2.7 x 4.0 cm, decreased from the prior measurement of 6.8 x 3.6 x 5.2 cm. High density elements are again seen in the medial aspect of this collection. 2. Minimal perihepatic free fluid. 3. Sigmoid diverticulosis without evidence of diverticulitis. 4. Persistent subcutaneous edema in the lateral abdominal wall and overlying bilateral hips. 5. Nonspecific rib sclerotic lesion in the lateral left 8th rib, stable from 2018. Aortic Atherosclerosis (ICD10-I70.0). Electronically  Signed   By: Fidela Salisbury M.D.   On: 10/13/2020 17:37   CT ABDOMEN PELVIS WO CONTRAST  Result Date: 09/30/2020 CLINICAL DATA:  Cholecystectomy 09/08/2020. Abdominal wall abscess with sepsis and bacteremia. EXAM: CT ABDOMEN AND PELVIS WITHOUT CONTRAST TECHNIQUE: Multidetector CT imaging of the abdomen and pelvis was performed following the standard protocol without IV contrast. COMPARISON:  09/25/2020 FINDINGS: Lower chest: Borderline cardiomegaly. The tip of the central line is visible in the distal SVC. Coronary and aortic atherosclerosis. Small bilateral pleural effusions with atelectasis in both lower lobes disproportionate to size of the pleural effusions. Nasogastric tube enters the stomach. Hepatobiliary: Pigtail catheter along the gallbladder fossa. Irregular collection of gas and fluid along the gallbladder fossa measuring about 6.8 by 3.6 by 5.2 cm traversed by the pigtail catheter, the coil of the pigtail is in the posterior portion of this collection. There also small locules of gas along the adjacent peritoneum near the entry site of the pigtail catheter for example on image 41 series 2. Mild hypodensity and expansion of the adjacent upper abdominal musculature in the vicinity of the pigtail catheter. Higher density elements along the collection of gas and fluid in the gallbladder fossa on image 36 of series 2, probably from suture material, less likely spilled gallstones. Pancreas: Unremarkable Spleen: Unremarkable Adrenals/Urinary Tract: Both adrenal glands appear normal. Foley catheter in the otherwise collapsed urinary bladder. No hydronephrosis or hydroureter.  Exophytic 1.9 cm lesion of the left kidney upper pole laterally with some high density elements posteriorly, complex lesion not excluded. A small mass is not excluded. Stomach/Bowel: Nasogastric tube terminates in the stomach body. Transverse, descending, and sigmoid colon diverticulosis. There is some stranding in the adipose  tissue adjacent to the hepatic flexure which is likely attributable to the inflammatory process along the gallbladder fossa; a hepatic flexure injury or fistula to the gallbladder fossa region is considered less likely although not totally excluded on today's noncontrast examination. Vascular/Lymphatic: Aortoiliac atherosclerotic vascular disease. Reproductive: Unremarkable Other: Subcutaneous edema especially laterally along the hips and flanks. Scattered omental, mesenteric, and particularly left retroperitoneal edema also tracking along the left iloipsoas. Mild ascites. Presacral edema. Musculoskeletal: Nonspecific rim sclerotic lesion in the left eighth rib laterally on image 23 series 2, stable from 2018. Lower thoracic and lumbar spondylosis and degenerative disc disease contributing to multilevel substantial impingement. IMPRESSION: 1. Collection of gas and fluid in the gallbladder fossa with small locules of gas also tracking along the pigtail catheter drain along the peritoneal margin of the right anterior abdominal wall. Given that we are over 3 weeks out from cholecystectomy, this collection is abnormal and is compatible with abscess. As on prior exams, the adjacent hepatic flexure is somewhat indistinct with some spanning stranding, making it difficult to completely exclude a fistula between the gallbladder fossa region and the colon although no obvious linear collection of extraluminal gas is seen. 2. Third spacing of fluid with ascites, widespread mesenteric and subcutaneous edema. Retroperitoneal edema on the left. 3. Bibasilar atelectasis with small bilateral pleural effusions. 4.  Aortic Atherosclerosis (ICD10-I70.0).  Coronary atherosclerosis. 5. Multilevel thoracic and lumbar impingement. 6. Transverse, descending, and sigmoid colon diverticulosis. 7. Nonspecific sclerotic lesion in the left eighth rib laterally, no change from 2018, highly likely to be benign. 8. Potentially complex exophytic  lesion from the left kidney upper pole, possibly a complex cyst although a small mass is not excluded. Electronically Signed   By: Van Clines M.D.   On: 09/30/2020 13:47   CT ABDOMEN PELVIS WO CONTRAST  Result Date: 09/25/2020 CLINICAL DATA:  Slurred speech. Balance disturbance. Recent cholecystectomy. Abdominal discomfort. EXAM: CT ABDOMEN AND PELVIS WITHOUT CONTRAST TECHNIQUE: Multidetector CT imaging of the abdomen and pelvis was performed following the standard protocol without IV contrast. COMPARISON:  07/09/2020 FINDINGS: Lower chest: Patchy atelectasis in both lower lobes. No consolidation or lobar collapse. Hepatobiliary: Large abscess in the gallbladder fossa, measuring over 10 cm in diameter. Air bubbles. Surrounding edematous change in the regional fat. Pancreas: Normal Spleen: Normal Adrenals/Urinary Tract: Adrenal glands are normal. Kidneys are normal. Bladder is normal. Stomach/Bowel: Stomach and small intestine are normal. No evidence of appendicitis. Evidence of colon obstruction. Diverticulosis without evidence of diverticulitis. Vascular/Lymphatic: Aortic atherosclerosis. No aneurysm. IVC is normal. No retroperitoneal adenopathy. Reproductive: Normal Other: No free fluid or air.  No evidence of distant abscess. Musculoskeletal: Advanced lower lumbar degenerative changes. IMPRESSION: Large abscess in the gallbladder fossa, at least 10 cm in size. No evidence free intraperitoneal fluid or distant abscess/collection. Atelectasis in both lower lobes. Aortic Atherosclerosis (ICD10-I70.0). Electronically Signed   By: Nelson Chimes M.D.   On: 09/25/2020 16:31   DG Abd 1 View  Result Date: 09/29/2020 CLINICAL DATA:  NG tube placement EXAM: ABDOMEN - 1 VIEW COMPARISON:  09/25/2020 FINDINGS: Limited radiograph of the lower chest and upper abdomen was obtained for the purposes of enteric tube localization. Enteric tube is seen coursing below the diaphragm  with distal tip and side port  terminating within the expected location of the gastric body. A pigtail drainage catheter is partially visualized within the right upper quadrant of the abdomen. IMPRESSION: Enteric tube tip and side port project within the expected location of the gastric body. Electronically Signed   By: Davina Poke D.O.   On: 09/29/2020 08:09   CT HEAD WO CONTRAST  Result Date: 09/30/2020 CLINICAL DATA:  Mental status change, unknown cause. Additional history provided: Patient extubated and still not at baseline. EXAM: CT HEAD WITHOUT CONTRAST TECHNIQUE: Contiguous axial images were obtained from the base of the skull through the vertex without intravenous contrast. COMPARISON:  Prior head CT 09/25/2020. FINDINGS: Brain: Mild cerebral atrophy. Mild patchy and ill-defined hypoattenuation within the cerebral white matter, nonspecific but compatible with chronic small vessel ischemic disease. There is no acute intracranial hemorrhage. No demarcated cortical infarct. No extra-axial fluid collection. No evidence of intracranial mass. No midline shift. Vascular: No hyperdense vessel.  Atherosclerotic calcifications. Skull: Normal. Negative for fracture or focal lesion. Sinuses/Orbits: Visualized orbits show no acute finding. Trace bilateral ethmoid sinus mucosal thickening. IMPRESSION: No CT evidence of acute intracranial abnormality. Stable non-contrast CT appearance of the brain as compared to 09/25/2020. Mild cerebral atrophy and chronic small vessel ischemic disease. Electronically Signed   By: Kellie Simmering DO   On: 09/30/2020 17:45   CT HEAD WO CONTRAST  Result Date: 09/25/2020 CLINICAL DATA:  Slurred speech.  Recent cholecystectomy. EXAM: CT HEAD WITHOUT CONTRAST TECHNIQUE: Contiguous axial images were obtained from the base of the skull through the vertex without intravenous contrast. COMPARISON:  None. FINDINGS: Brain: Mild generalized volume loss. No evidence of old or acute focal infarction, mass lesion,  hemorrhage, hydrocephalus or extra-axial collection. Vascular: There is atherosclerotic calcification of the major vessels at the base of the brain. Skull: Negative Sinuses/Orbits: Clear/normal Other: None IMPRESSION: No acute or significant finding.  Mild generalized volume loss. Electronically Signed   By: Nelson Chimes M.D.   On: 09/25/2020 16:33   MR BRAIN WO CONTRAST  Result Date: 10/04/2020 CLINICAL DATA:  Mental status change. Septic shock. Abdominal abscess. EXAM: MRI HEAD WITHOUT CONTRAST TECHNIQUE: Multiplanar, multiecho pulse sequences of the brain and surrounding structures were obtained without intravenous contrast. COMPARISON:  CT head 09/30/2020 FINDINGS: Brain: Mild cerebral atrophy. Negative for hydrocephalus. Mild white matter changes with scattered small white matter hyperintensities bilaterally. Negative for acute infarct.  Negative for hemorrhage mass or edema. Vascular: Normal arterial flow voids Skull and upper cervical spine: Negative Sinuses/Orbits: Paranasal sinuses clear.  Normal orbit Other: None IMPRESSION: No acute abnormality. Mild atrophy and mild white matter changes consistent with chronic microvascular ischemia. Electronically Signed   By: Franchot Gallo M.D.   On: 10/04/2020 12:33   US RENAL  Result Date: 09/26/2020 CLINICAL DATA:  Acute kidney failure EXAM: RENAL / URINARY TRACT ULTRASOUND COMPLETE COMPARISON:  CT abdomen pelvis 09/25/2020 FINDINGS: Right Kidney: Renal measurements: 13.8 x 6.3 x 5.9 cm = volume: 270 mL. Echogenicity within normal limits. No mass or hydronephrosis visualized. Left Kidney: Renal measurements: 12.9 x 7.1 x 5.3 cm = volume: 250 mL. Echogenicity within normal limits. No hydronephrosis. There is a small cyst in the superior pole measuring 2.6 cm. Bladder: Decompressed with Foley catheter in place. Other: None. IMPRESSION: No acute finding sonographically in the bilateral kidneys. Electronically Signed   By: Audie Pinto M.D.   On:  09/26/2020 14:13   DG Chest Port 1 View  Result Date: 09/28/2020 CLINICAL  DATA:  Evaluate ET tube placement EXAM: PORTABLE CHEST 1 VIEW COMPARISON:  09/27/2020 FINDINGS: Endotracheal tube tip is above the carina. There is a left IJ central venous catheter with tip in the projection of the SVC. Decreased lung volumes and small bilateral pleural effusions. Atelectasis noted in the left lung base. IMPRESSION: 1. Decreased lung volumes with small bilateral pleural effusions. 2. Left base atelectasis. Electronically Signed   By: Kerby Moors M.D.   On: 09/28/2020 08:07   DG Chest Port 1 View  Result Date: 09/27/2020 CLINICAL DATA:  Central line placement EXAM: PORTABLE CHEST 1 VIEW COMPARISON:  09/25/2020 FINDINGS: Interval placement of left neck large bore multi lumen vascular catheter, tips projecting over the mid SVC. Interval placement of endotracheal tube, tip projecting over the mid trachea. Interval placement of esophagogastric tube, tip and side port well above the diaphragm, at least 5 cm. Mild, diffuse interstitial pulmonary opacity, unchanged compared to prior. IMPRESSION: 1. Interval placement of left neck large bore multi lumen vascular catheter, tips projecting over the mid SVC. 2. Interval placement of endotracheal tube, tip projecting over the mid trachea. 3. Interval placement of esophagogastric tube, tip and side port well above the diaphragm, at least 5 cm. Recommend advancement to ensure subdiaphragmatic positioning. Electronically Signed   By: Eddie Candle M.D.   On: 09/27/2020 12:00   DG Chest Portable 1 View  Result Date: 09/25/2020 CLINICAL DATA:  Sepsis and altered mental status EXAM: PORTABLE CHEST 1 VIEW COMPARISON:  None. FINDINGS: Cardiac shadow is within normal limits. The lungs are hypoinflated with mild bibasilar atelectasis. No bony abnormality is seen. IMPRESSION: Mild bibasilar atelectasis in part due to a poor inspiratory effort. Electronically Signed   By: Inez Catalina  M.D.   On: 09/25/2020 16:05   ECHOCARDIOGRAM COMPLETE  Result Date: 09/28/2020    ECHOCARDIOGRAM REPORT   Patient Name:   Richard Oconnell [facility] Date of Exam: 09/27/2020 Medical Rec #:  [id number]            Height:       76.0 in Accession #:    [id number]           Weight:       235.9 lb Date of Birth:  May 11, 1956             BSA:          2.376 m Patient Age:    39 years             BP:           99/75 mmHg Patient Gender: M                    HR:           61 bpm. Exam Location:  [facility] Procedure: 2D Echo, Cardiac Doppler and Color Doppler Indications:     Bacteremia R78.81  History:         Patient has no prior history of Echocardiogram examinations.                  Risk Factors:Hypertension and Diabetes.  Sonographer:     Alyse Low Roar Referring Phys:  Vivian Diagnosing Phys: Ida Rogue MD IMPRESSIONS  1. Left ventricular ejection fraction, by estimation, is 60 to 65%. The left ventricle has normal function. The left ventricle has no regional wall motion abnormalities. Left ventricular diastolic parameters are consistent with Grade II diastolic dysfunction (pseudonormalization).  2. Right ventricular systolic function  is normal. The right ventricular size is normal. There is normal pulmonary artery systolic pressure. The estimated right ventricular systolic pressure is 23.5 mmHg.  3. The mitral valve is normal in structure. No evidence of mitral valve regurgitation. No evidence of mitral stenosis.  4. No valve vegetation. FINDINGS  Left Ventricle: Left ventricular ejection fraction, by estimation, is 60 to 65%. The left ventricle has normal function. The left ventricle has no regional wall motion abnormalities. The left ventricular internal cavity size was normal in size. There is  no left ventricular hypertrophy. Left ventricular diastolic parameters are consistent with Grade II diastolic dysfunction (pseudonormalization). Right Ventricle: The right ventricular size is normal. No increase in  right ventricular wall thickness. Right ventricular systolic function is normal. There is normal pulmonary artery systolic pressure. The tricuspid regurgitant velocity is 2.03 m/s, and  with an assumed right atrial pressure of 5 mmHg, the estimated right ventricular systolic pressure is 57.3 mmHg. Left Atrium: Left atrial size was normal in size. Right Atrium: Right atrial size was normal in size. Pericardium: There is no evidence of pericardial effusion. Mitral Valve: The mitral valve is normal in structure. No evidence of mitral valve regurgitation. No evidence of mitral valve stenosis. Tricuspid Valve: The tricuspid valve is normal in structure. Tricuspid valve regurgitation is not demonstrated. No evidence of tricuspid stenosis. Aortic Valve: The aortic valve is normal in structure. Aortic valve regurgitation is not visualized. No aortic stenosis is present. Aortic valve peak gradient measures 2.3 mmHg. Pulmonic Valve: The pulmonic valve was normal in structure. Pulmonic valve regurgitation is not visualized. No evidence of pulmonic stenosis. Aorta: The aortic root is normal in size and structure. Venous: The inferior vena cava is normal in size with greater than 50% respiratory variability, suggesting right atrial pressure of 3 mmHg. IAS/Shunts: No atrial level shunt detected by color flow Doppler.  LEFT VENTRICLE PLAX 2D LVIDd:         5.27 cm  Diastology LVIDs:         4.09 cm  LV e' medial:    6.31 cm/s LV PW:         1.04 cm  LV E/e' medial:  9.8 LV IVS:        1.21 cm  LV e' lateral:   9.14 cm/s LVOT diam:     2.20 cm  LV E/e' lateral: 6.8 LVOT Area:     3.80 cm  LEFT ATRIUM         Index LA diam:    3.80 cm 1.60 cm/m  AORTIC VALVE               PULMONIC VALVE AV Area (Vmax): 2.58 cm   PV Vmax:        0.72 m/s AV Vmax:        75.60 cm/s PV Peak grad:   2.1 mmHg AV Peak Grad:   2.3 mmHg   RVOT Peak grad: 1 mmHg LVOT Vmax:      51.40 cm/s  AORTA Ao Root diam: 3.40 cm MITRAL VALVE               TRICUSPID  VALVE MV Area (PHT): 2.91 cm    TR Peak grad:   16.5 mmHg MV Decel Time: 261 msec    TR Vmax:        203.00 cm/s MV E velocity: 62.10 cm/s MV A velocity: 42.00 cm/s  SHUNTS MV E/A ratio:  1.48        Systemic Diam:  2.20 cm MV A Prime:    6.0 cm/s Ida Rogue MD Electronically signed by Ida Rogue MD Signature Date/Time: 09/28/2020/8:13:00 PM    Final    CT IMAGE GUIDED DRAINAGE BY PERCUTANEOUS CATHETER  Result Date: 10/02/2020 CLINICAL DATA:  Previous cholecystectomy, with postop abscess, status post percutaneous drain catheter placement 09/25/2020. Follow-up CT demonstrated incomplete resolution of collection with catheter at the margin of the residual collection. Drain revision requested. EXAM: CT GUIDED REVISION OF PERITONEAL DRAIN CATHETER ANESTHESIA/SEDATION: 1% lidocaine subcutaneous PROCEDURE: The procedure, risks, benefits, and alternatives were explained to the patient's sibling. Questions regarding the procedure were encouraged and answered. The brother understands and consents to the procedure. Select axial scans through the upper abdomen were obtained. The drain catheter entry site and surrounding skin were prepped with chlorhexidinein a sterile fashion, and a sterile drape was applied covering the operative field. A sterile gown and sterile gloves were used for the procedure. Local anesthesia was provided with 1% Lidocaine. The retention catheter was cut. The previously placed catheter was partially withdrawn, position confirmed under CT fluoroscopy. Catheter was then cut and exchanged over a stiff Amplatz wire for a new 14 French multi sidehole pigtail drain catheter, formed and position within the central aspect of the residual 7 patent collection. CT fluoroscopy confirms appropriate positioning. 20 mL of purulent material were aspirated. The catheter was secured externally with 0 Prolene suture and StatLock, and placed to gravity drain bag with good return of additional purulent contents.  The patient tolerated the procedure well. COMPLICATIONS: None immediate FINDINGS: The previously placed drain catheter was at the inferior margin of the residual subhepatic complex gas and fluid collection. The catheter was exchanged for a 14 French device placed centrally within the residual collection with good return of purulent material. IMPRESSION: 1. Technically successful exchange and revision of right upper quadrant peritoneal abscess drain catheter. Electronically Signed   By: Lucrezia Europe M.D.   On: 10/02/2020 11:57   CT IMAGE GUIDED DRAINAGE BY PERCUTANEOUS CATHETER  Result Date: 09/26/2020 INDICATION: 65 year old male with a history of prior acute cholecystitis initially treated by percutaneous cholecystostomy tube placement followed by definitive cholecystectomy. Patient now presents with sepsis and a large abscess in the gallbladder fossa. He requires urgent drainage for source control. EXAM: CT-guided drain placement MEDICATIONS: The patient is currently admitted to the hospital and receiving intravenous antibiotics. The antibiotics were administered within an appropriate time frame prior to the initiation of the procedure. ANESTHESIA/SEDATION: Fentanyl 50 mcg IV; Versed 1 mg IV Moderate Sedation Time:  15 minutes The patient was continuously monitored during the procedure by the interventional radiology nurse under my direct supervision. COMPLICATIONS: None immediate. PROCEDURE: Informed written consent was obtained from the patient after a thorough discussion of the procedural risks, benefits and alternatives. All questions were addressed. Maximal Sterile Barrier Technique was utilized including caps, mask, sterile gowns, sterile gloves, sterile drape, hand hygiene and skin antiseptic. A timeout was performed prior to the initiation of the procedure. A planning axial CT scan was performed. The large fluid and gas collection in the gallbladder fossa was successfully identified. A suitable skin  entry site was selected and marked. The overlying skin was sterilely prepped and draped in the standard fashion using chlorhexidine skin prep. Local anesthesia was attained by infiltration with 1% lidocaine. A small dermatotomy was made. A 22 gauge spinal needle was advanced and imaged obtaining confirmation of the angle for a safe path into the fluid collection. Next, using tandem trocar technique, a  12 [place] cook all-purpose drainage catheter was advanced into the fluid collection. The catheter was formed and aspiration performed yielding approximately 200 mL purulent and foul-smelling fluid. Samples were sent for Gram stain and culture. The catheter was then connected to JP bulb suction and secured to the skin with 0 Prolene suture. Follow-up CT imaging demonstrates a well-positioned drainage catheter. The patient was returned to the ICU in stable condition. IMPRESSION: Successful placement of 12 French drainage catheter into gallbladder fossa abscess with aspiration of approximately 200 mL foul-smelling and purulent fluid. Samples were sent for g stain and culture. Electronically Signed   By: Jacqulynn Cadet M.D.   On: 09/26/2020 09:31       Subjective:   Discharge Exam: Vitals:   10/14/20 0441 10/14/20 0748 10/14/20 1156 10/14/20 1520  BP:  (!) 147/93 131/87 130/82  Pulse:  78 (!) 105 73  Resp:  18 17 18   Temp:  98.5 F (36.9 C) 98.1 F (36.7 C) 98.4 F (36.9 C)  TempSrc:  Oral Oral Oral  SpO2:  97% 99% 98%  Weight: 103.2 kg     Height:        General: Pt is alert, awake, not in acute distress Cardiovascular: RRR, S1/S2 +, no rubs, no gallops Respiratory: CTA bilaterally, no wheezing, no rhonchi Abdominal: Soft, NT, ND, bowel sounds + Extremities: no edema, no cyanosis    The results of significant diagnostics from this hospitalization (including imaging, microbiology, ancillary and laboratory) are listed below for reference.     Microbiology: No results found for this or  any previous visit (from the past 240 hour(s)).   Labs: BNP (last 3 results) No results for input(s): BNP in the last 8760 hours. Basic Metabolic Panel: Recent Labs  Lab 10/08/20 1010 10/09/20 0445 10/10/20 0644 10/11/20 0552 10/12/20 0305 10/13/20 0632 10/14/20 0641  NA 150* 149* 144 144 141 142 143  K 3.6 3.3* 3.5 3.3* 3.9 3.5 3.3*  CL 115* 114* 111 111 110 110 109  CO2 24 23 23 23 23  21* 23  GLUCOSE 119* 128* 125* 102* 124* 94 101*  BUN 65* 59* 50* 40* 33* 24* 22  CREATININE 2.36* 2.08* 2.01* 1.97* 1.83* 1.82* 1.82*  CALCIUM 8.3* 8.3* 8.0* 8.3* 8.3* 8.3* 8.5*  MG  --  2.1 1.9 1.9 2.1 2.0 1.9  PHOS 4.5 4.2  --   --   --   --   --    Liver Function Tests: Recent Labs  Lab 10/08/20 1010 10/09/20 0445  ALBUMIN 2.3* 2.4*   No results for input(s): LIPASE, AMYLASE in the last 168 hours. No results for input(s): AMMONIA in the last 168 hours. CBC: Recent Labs  Lab 10/10/20 0644 10/11/20 0552 10/12/20 0305 10/13/20 0632 10/14/20 0641  WBC 8.1 6.9 6.6 6.1 6.3  HGB 9.1* 9.4* 9.8* 9.8* 10.2*  HCT 28.4* 29.6* 30.1* 30.7* 31.4*  MCV 85.3 85.1 83.8 83.7 84.0  PLT 268 272 241 256 233   Cardiac Enzymes: No results for input(s): CKTOTAL, CKMB, CKMBINDEX, TROPONINI in the last 168 hours. BNP: Invalid input(s): POCBNP CBG: Recent Labs  Lab 10/13/20 1703 10/13/20 2103 10/14/20 0749 10/14/20 1155 10/14/20 1624  GLUCAP 102* 135* 112* 188* 138*   D-Dimer No results for input(s): DDIMER in the last 72 hours. Hgb A1c No results for input(s): HGBA1C in the last 72 hours. Lipid Profile No results for input(s): CHOL, HDL, LDLCALC, TRIG, CHOLHDL, LDLDIRECT in the last 72 hours. Thyroid function studies No results for input(s):  TSH, T4TOTAL, T3FREE, THYROIDAB in the last 72 hours.  Invalid input(s): FREET3 Anemia work up No results for input(s): VITAMINB12, FOLATE, FERRITIN, TIBC, IRON, RETICCTPCT in the last 72 hours. Urinalysis    Component Value Date/Time    COLORURINE RED (A) 09/26/2020 1336   APPEARANCEUR TURBID (A) 09/26/2020 1336   APPEARANCEUR Clear 04/25/2018 1022   LABSPEC 1.020 09/26/2020 1336   PHURINE  09/26/2020 1336    TEST NOT REPORTED DUE TO COLOR INTERFERENCE OF URINE PIGMENT   GLUCOSEU (A) 09/26/2020 1336    TEST NOT REPORTED DUE TO COLOR INTERFERENCE OF URINE PIGMENT   HGBUR (A) 09/26/2020 1336    TEST NOT REPORTED DUE TO COLOR INTERFERENCE OF URINE PIGMENT   BILIRUBINUR (A) 09/26/2020 1336    TEST NOT REPORTED DUE TO COLOR INTERFERENCE OF URINE PIGMENT   BILIRUBINUR Negative 04/25/2018 1022   KETONESUR (A) 09/26/2020 1336    TEST NOT REPORTED DUE TO COLOR INTERFERENCE OF URINE PIGMENT   PROTEINUR (A) 09/26/2020 1336    TEST NOT REPORTED DUE TO COLOR INTERFERENCE OF URINE PIGMENT   UROBILINOGEN 0.2 11/16/2016 1354   NITRITE (A) 09/26/2020 1336    TEST NOT REPORTED DUE TO COLOR INTERFERENCE OF URINE PIGMENT   LEUKOCYTESUR (A) 09/26/2020 1336    TEST NOT REPORTED DUE TO COLOR INTERFERENCE OF URINE PIGMENT   Sepsis Labs Invalid input(s): PROCALCITONIN,  WBC,  LACTICIDVEN Microbiology No results found for this or any previous visit (from the past 240 hour(s)).   Time coordinating discharge: 44mins  SIGNED:   Kathie Dike, MD  [facility] 10/14/2020, 6:41 PM   If 7PM-7AM, please contact night-coverage [email or web address]

## 2020-10-14 NOTE — Progress Notes (Signed)
ANTICOAGULATION CONSULT NOTE  Pharmacy Consult for heparin infusion  Indication: atrial fibrillation  Patient Measurements: Height: 6' 3.98" (193 cm) Weight: 103.2 kg (227 lb 9.6 oz) IBW/kg (Calculated) : 86.76  Vital Signs: Temp: 98.5 F (36.9 C) (05/31 0748) Temp Source: Oral (05/31 0748) BP: 147/93 (05/31 0748) Pulse Rate: 78 (05/31 0748)  Labs: Recent Labs    10/12/20 0305 10/13/20 2376 10/13/20 1705 10/13/20 2307 10/14/20 0641  HGB 9.8* 9.8*  --   --  10.2*  HCT 30.1* 30.7*  --   --  31.4*  PLT 241 256  --   --  233  HEPARINUNFRC 0.35 0.21* 0.30 0.35 0.42  CREATININE 1.83* 1.82*  --   --  1.82*    Estimated Creatinine Clearance: 49.7 mL/min (A) (by C-G formula based on SCr of 1.82 mg/dL (H)).   Medical History: Past Medical History:  Diagnosis Date  . Cancer [facility])    Bladder Cancer  . Erectile dysfunction associated with type 2 diabetes mellitus ([place]) 05/26/2016  . Gross hematuria 11/16/2016  . Hypertension 06/11/2015  . Type 2 diabetes mellitus with diabetic polyneuropathy, without long-term current use of insulin ([place]) 06/11/2015    Assessment: 65 yo male  with history of hypertension, bladder cancer, diabetes type 2, acute cholecystitis with cholecystectomy tube placed July 10 2020, followed by robotic assisted cholecystectomy September 08, 2020, in the intensive care unit with septic shock, bacteremia, large abdominal abscess, multiorgan failure with complications of severe metabolic acidosis,  atrial fibrillation, acute renal failure with ATN from sepsis and started on hemodialysis. Pt was previously on SQ heparin for DVT ppx. Pharmacy has been consulted for heparin dosing and monitoring until Eliquis can be started once biliary drain managed. H&H, platelets stable.  5/30 0757 HL = 0.21, increased to 1800 units/hr 5/30 1705 HL = 0.30, therapeutic x 1  5/30 2307 HL = 0.35, therapeutic x 2 5/30 0641 HL = 0.42, therapeutic.   Goal of Therapy:  Heparin level  0.3-0.7 units/ml Monitor platelets by anticoagulation protocol: Yes   Plan:  Heparin level is therapeutic. Will continue heparin infusion at 1800 units/hr. Recheck HL and CBC with AM labs.   Eleonore Chiquito, PharmD, BCPS 10/14/2020 7:53 AM

## 2020-10-14 NOTE — Progress Notes (Signed)
Occupational Therapy Treatment Patient Details Name: Richard Oconnell MRN: [id number] DOB: May 29, 1955 Today's Date: 10/14/2020    History of present illness 65 yo male with previous dx of acute cholecystitis s/p cholecystotomy tube 2/24 and then follow up robotic assisted cholecystectomy 4/25, now presents with large abdominal abscess with severe septic shock and BACTEREMIA, with multiorgan failure (liver and renal failure) complicated by severe metabolic acidosis and toxic metabolic encephalopathy.   OT comments  Richard Oconnell presents today greatly improved compared to previous OT visits during this hospitalization. Today he was able to move recliner<>stand with Mod I, to ambulate to bathroom with RW and CGA, one small LOB from which pt was able to self-recover. He performed clothing manipulation and peri-care with SUPV/CGA, and engaged in grooming, bathing in standing at sink, with one-handed UE support, with CGA. He elected to return to bed rather than recliner at end of session, and was able to move sit<>supine with Mod I. In comparison, one week ago, pt required co-tx from OT/PT, with Max/Total A + 2 for functional mobility and all aspects of self-care. Four days ago, pt required MaxA +2 for rolling/bed mobility. Richard Oconnell was enthusiastic about the progress he has made this past week, and looks forward to moving soon to [facility], where he can continue regaining strength and endurance, prior to returning home and getting back to work at [employer].   Follow Up Recommendations  SNF;Supervision/Assistance - 24 hour    Equipment Recommendations       Recommendations for Other Services      Precautions / Restrictions Precautions Precautions: Fall Restrictions Weight Bearing Restrictions: No       Mobility Bed Mobility Overal bed mobility: Needs Assistance Bed Mobility: Sit to Supine       Sit to supine: Modified independent (Device/Increase time)        Transfers Overall transfer  level: Needs assistance Equipment used: Rolling walker (2 wheeled) Transfers: Sit to/from Stand Sit to Stand: Min guard Stand pivot transfers: Min guard            Balance Overall balance assessment: Needs assistance Sitting-balance support: Bilateral upper extremity supported;Feet supported Sitting balance-Leahy Scale: Good     Standing balance support: Bilateral upper extremity supported;During functional activity Standing balance-Leahy Scale: Fair Standing balance comment: one LOB during ambulation with RW, pt able to self-recover                           ADL either performed or assessed with clinical judgement   ADL Overall ADL's : Needs assistance/impaired     Grooming: Oral care;Wash/dry face;Wash/dry hands;Standing;Min guard;Brushing hair Grooming Details (indicate cue type and reason): using sink for UE support Upper Body Bathing: Standing;Min guard               Toilet Transfer: Supervision/safety;Regular Toilet;RW;Stand-pivot   Toileting- Clothing Manipulation and Hygiene: Supervision/safety;Sit to/from stand Toileting - Clothing Manipulation Details (indicate cue type and reason): pt able to provide posterior peri care in standing, with SUPV and 1-handed support on RW     Functional mobility during ADLs: Min guard       Vision Baseline Vision/History: Wears glasses Wears Glasses: Reading only Patient Visual Report: No change from baseline     Perception     Praxis      Cognition Arousal/Alertness: Awake/alert Behavior During Therapy: WFL for tasks assessed/performed Overall Cognitive Status: Within Functional Limits for tasks assessed  Exercises Other Exercises Other Exercises: mobility, transfers, toileting, grooming, bathing   Shoulder Instructions       General Comments      Pertinent Vitals/ Pain       Pain Assessment: No/denies pain  Home Living                                           Prior Functioning/Environment              Frequency  Min 2X/week        Progress Toward Goals  OT Goals(current goals can now be found in the care plan section)  Progress towards OT goals: Progressing toward goals  Acute Rehab OT Goals Patient Stated Goal: to regain leg strength Time For Goal Achievement: 10/17/20 Potential to Achieve Goals: Good  Plan Discharge plan remains appropriate;Frequency remains appropriate    Co-evaluation                 AM-PAC OT "6 Clicks" Daily Activity     Outcome Measure   Help from another person eating meals?: None Help from another person taking care of personal grooming?: None Help from another person toileting, which includes using toliet, bedpan, or urinal?: A Little Help from another person bathing (including washing, rinsing, drying)?: A Little Help from another person to put on and taking off regular upper body clothing?: A Little Help from another person to put on and taking off regular lower body clothing?: A Lot 6 Click Score: 19    End of Session Equipment Utilized During Treatment: Rolling walker  OT Visit Diagnosis: Unsteadiness on feet (R26.81);Muscle weakness (generalized) (M62.81)   Activity Tolerance Patient tolerated treatment well   Patient Left in bed;with bed alarm set;with call bell/phone within reach   Nurse Communication          Time: 1420-1501 OT Time Calculation (min): 41 min  Charges: OT General Charges $OT Visit: 1 Visit OT Treatments $Self Care/Home Management : 38-52 mins  Josiah Lobo, PhD, MS, OTR/L 10/14/20, 4:00 PM

## 2020-10-14 NOTE — Progress Notes (Signed)
Progress Note  Patient Name: Richard Oconnell Date of Encounter: 10/14/2020  [facility] Cardiologist: Esmond Plants, MD PhD  Subjective   Patient feels well today.  No chest pain, shortness of breath, palpitations, or lightheadedness.  He is currently working with physical therapy.  Inpatient Medications    Scheduled Meds: . amLODipine  10 mg Oral Daily  . Chlorhexidine Gluconate Cloth  6 each Topical Daily  . feeding supplement  237 mL Oral TID BM  . insulin aspart  0-20 Units Subcutaneous TID WC  . insulin aspart  0-5 Units Subcutaneous QHS  . metoprolol tartrate  25 mg Oral BID  . multivitamin  1 tablet Oral QHS  . multivitamin with minerals  1 tablet Oral Daily  . pantoprazole (PROTONIX) IV  40 mg Intravenous Q24H  . sodium chloride flush  3 mL Intravenous Q12H   Continuous Infusions: . sodium chloride Stopped (10/09/20 1150)  . sodium chloride    . sodium chloride Stopped (10/04/20 1112)  . ampicillin-sulbactam (UNASYN) IV 3 g (10/14/20 0440)  . heparin 1,800 Units/hr (10/14/20 0323)   PRN Meds: sodium chloride, acetaminophen, albuterol, docusate, LORazepam, melatonin, metoprolol tartrate, ondansetron (ZOFRAN) IV, polyethylene glycol, sodium chloride flush   Vital Signs    Vitals:   10/13/20 2004 10/14/20 0439 10/14/20 0441 10/14/20 0748  BP: (!) 151/90 (!) 149/84  (!) 147/93  Pulse: 73 71  78  Resp: 16 17  18   Temp: 98.4 F (36.9 C) 98.5 F (36.9 C)  98.5 F (36.9 C)  TempSrc:    Oral  SpO2: 99% 97%  97%  Weight:   103.2 kg   Height:        Intake/Output Summary (Last 24 hours) at 10/14/2020 0955 Last data filed at 10/14/2020 0500 Gross per 24 hour  Intake 560 ml  Output 1180 ml  Net -620 ml   Last 3 Weights 10/14/2020 10/13/2020 10/13/2020  Weight (lbs) 227 lb 9.6 oz 220 lb 14.4 oz 225 lb  Weight (kg) 103.239 kg 100.2 kg 102.059 kg      Telemetry    Normal sinus rhythm with sinus arrhythmia, 4 beat run of NSVT, and brief episodes of  idioventricular rhythm - Personally Reviewed  ECG    No new tracing.  Physical Exam   GEN: No acute distress.   Neck: No JVD Cardiac: RRR, no murmurs, rubs, or gallops.  Respiratory: Clear to auscultation bilaterally. GI: Soft, nontender, non-distended.  Right-sided drain present without drainage noted. MS: No edema; No deformity. Neuro:  Nonfocal  Psych: Normal affect   Labs    High Sensitivity Troponin:  No results for input(s): TROPONINIHS in the last 720 hours.    Chemistry Recent Labs  Lab 10/08/20 1010 10/09/20 0445 10/10/20 0644 10/12/20 0305 10/13/20 0632 10/14/20 0641  NA 150* 149*   < > 141 142 143  K 3.6 3.3*   < > 3.9 3.5 3.3*  CL 115* 114*   < > 110 110 109  CO2 24 23   < > 23 21* 23  GLUCOSE 119* 128*   < > 124* 94 101*  BUN 65* 59*   < > 33* 24* 22  CREATININE 2.36* 2.08*   < > 1.83* 1.82* 1.82*  CALCIUM 8.3* 8.3*   < > 8.3* 8.3* 8.5*  ALBUMIN 2.3* 2.4*  --   --   --   --   GFRNONAA 30* 35*   < > 40* 41* 41*  ANIONGAP 11 12   < >  8 11 11    < > = values in this interval not displayed.     Hematology Recent Labs  Lab 10/12/20 0305 10/13/20 6834 10/14/20 0641  WBC 6.6 6.1 6.3  RBC 3.59* 3.67* 3.74*  HGB 9.8* 9.8* 10.2*  HCT 30.1* 30.7* 31.4*  MCV 83.8 83.7 84.0  MCH 27.3 26.7 27.3  MCHC 32.6 31.9 32.5  RDW 17.8* 18.1* 18.2*  PLT 241 256 233    BNPNo results for input(s): BNP, PROBNP in the last 168 hours.   DDimer No results for input(s): DDIMER in the last 168 hours.   Radiology    CT ABDOMEN PELVIS WO CONTRAST  Result Date: 10/13/2020 CLINICAL DATA:  Status post cholecystectomy on 09/08/2020. Subsequent abscesses with percutaneous drain placement. EXAM: CT ABDOMEN AND PELVIS WITHOUT CONTRAST TECHNIQUE: Multidetector CT imaging of the abdomen and pelvis was performed following the standard protocol without IV contrast. COMPARISON:  Sep 30, 2020 FINDINGS: Lower chest: Mild hypoventilatory changes. Coronary and aortic atherosclerosis.  Hepatobiliary: Pigtail catheter along the gallbladder fossa is situated within gas and fluid collection measuring approximately 5.4 x 2.7 x 4.0 cm, decreased from the prior measurement of 6.8 x 3.6 x 5.2 cm. High density elements are again seen in the medial aspect of this collection. Minimal perihepatic free fluid. Pancreas: Unremarkable. No pancreatic ductal dilatation or surrounding inflammatory changes. Spleen: Normal in size without focal abnormality. Adrenals/Urinary Tract: Normal adrenal glands. Bilateral perirenal fat stranding. 2 cm probable left renal cyst. Normal gallbladder. Stomach/Bowel: Stomach is within normal limits. No evidence of appendicitis. No evidence of bowel wall thickening, distention, or inflammatory changes. Sigmoid diverticulosis without evidence of diverticulitis. Vascular/Lymphatic: Aortic atherosclerosis. No enlarged abdominal or pelvic lymph nodes. Reproductive: Prostate is unremarkable. Other: Persistent subcutaneous edema in the lateral abdominal wall and hips. Mild presacral edema. Musculoskeletal: Spondylosis of the thoracolumbar spine. In seen is nonspecific rib sclerotic lesion in the lateral left 8th rib. IMPRESSION: 1. Pigtail catheter along the gallbladder fossa is situated within gas and fluid collection measuring approximately 5.4 x 2.7 x 4.0 cm, decreased from the prior measurement of 6.8 x 3.6 x 5.2 cm. High density elements are again seen in the medial aspect of this collection. 2. Minimal perihepatic free fluid. 3. Sigmoid diverticulosis without evidence of diverticulitis. 4. Persistent subcutaneous edema in the lateral abdominal wall and overlying bilateral hips. 5. Nonspecific rib sclerotic lesion in the lateral left 8th rib, stable from 2018. Aortic Atherosclerosis (ICD10-I70.0). Electronically Signed   By: Fidela Salisbury M.D.   On: 10/13/2020 17:37    Cardiac Studies   TTE (09/27/2020): 1. Left ventricular ejection fraction, by estimation, is 60 to 65%.  The  left ventricle has normal function. The left ventricle has no regional  wall motion abnormalities. Left ventricular diastolic parameters are  consistent with Grade II diastolic  dysfunction (pseudonormalization).  2. Right ventricular systolic function is normal. The right ventricular  size is normal. There is normal pulmonary artery systolic pressure. The  estimated right ventricular systolic pressure is 19.6 mmHg.  3. The mitral valve is normal in structure. No evidence of mitral valve  regurgitation. No evidence of mitral stenosis.  4. No valve vegetation.   Patient Profile     65 y.o. male hx of HTN, DM2, bladder CA, previous admission with acute cholecystitis s/pcholecystotomy tube 2/24 with follow-up robotic assisted cholecystectomy 4/25, admitted 5/12 w/ septic shock, bacteremia, metabolic acidosis, multiorgan failure, and metabolic encephalopathy in the setting of a large abdominal abscess, complicated by paroxysmal afib w/  rvr. Amio associated w/ bradycardia and jxnl rhythm. [place] avoided due to draining abscess, lft abnormalities.  Assessment & Plan    Septic shock: Patient hemodynamically stable status post percutaneous drainage of intra-abdominal abscess, on antibiotics per ID.  Per IM, surgery, and ID.  Paroxysmal atrial fibrillation: Patient maintaining sinus rhythm with episodes of transient bradycardia including idioventricular rhythm.  I suspect vagal tone escalated by his intra-abdominal infection/acute illness is contributing.  Overall, his rhythm is stabilizing.  Continue metoprolol tartrate 25 mg twice daily.  Transition from heparin to apixaban 5 mg twice daily prior to discharge once no further invasive procedures are needed in the setting of CHA2DS2-VASc score of at least 3 (age, hypertension, and diabetes mellitus).  Acute kidney injury superimposed on chronic renal disease: Likely driven by septic shock.  Creatinine continues to trend down.  Avoid  nephrotoxic agents.  Hypertension: Blood pressure mildly elevated.  Continue current doses of metoprolol and amlodipine.  Consider addition of ACE inhibitor/ARB once renal function has normalized.  [facility] will sign off.   Medication Recommendations: Transition from heparin to apixaban 5 mg twice daily prior to discharge when appropriate from surgical standpoint Other recommendations (labs, testing, etc): None Follow up as an outpatient: 3 to 4 weeks with Dr. Rockey Situ or APP.  For questions or updates, please contact [place] Please consult [email or web address] for contact info under [facility].      Signed, Nelva Bush, MD  10/14/2020, 9:55 AM

## 2020-10-14 NOTE — Care Management Important Message (Signed)
Important Message  Patient Details  Name: Richard Oconnell MRN: [id number] Date of Birth: 1955/11/18   Medicare Important Message Given:  Yes     Dannette Barbara 10/14/2020, 11:38 AM

## 2020-10-14 NOTE — Progress Notes (Signed)
[facility], [place] 10/14/20  Subjective:   Hospital day # 19  Patient seen sitting up in chair Alert and oriented States his appetite has improved Denies nausea and vomiting Denies shortness of breath Feels that with PT/OT, he will be able to regain strength and discharge   Renal: 05/30 0701 - 05/31 0700 In: 565 [P.O.:360; IV Piggyback:200] Out: 1180 [Urine:1150; Drains:30] Lab Results  Component Value Date   CREATININE 1.82 (H) 10/14/2020   CREATININE 1.82 (H) 10/13/2020   CREATININE 1.83 (H) 10/12/2020      Objective:  Vital signs in last 24 hours:  Temp:  [98.1 F (36.7 C)-98.5 F (36.9 C)] 98.1 F (36.7 C) (05/31 1156) Pulse Rate:  [71-105] 105 (05/31 1156) Resp:  [16-18] 17 (05/31 1156) BP: (131-151)/(84-93) 131/87 (05/31 1156) SpO2:  [97 %-99 %] 99 % (05/31 1156) Weight:  [103.2 kg] 103.2 kg (05/31 0441)  Weight change: -1.86 kg Filed Weights   10/13/20 0516 10/13/20 1100 10/14/20 0441  Weight: 102.1 kg 100.2 kg 103.2 kg    Intake/Output:    Intake/Output Summary (Last 24 hours) at 10/14/2020 1308 Last data filed at 10/14/2020 0750 Gross per 24 hour  Intake 200 ml  Output 1530 ml  Net -1330 ml    Physical Exam: General:  No acute distress, sitting in chair  HEENT  [place]/AT oral mucosa moist  Pulm/lungs  Clear breath sounds , normal effort  CVS/Heart  regular rhythm, no rub or gallop  Abdomen:   Soft, nontender, BS present  Extremities:  no peripheral edema  Neurologic:  Awake and oriented  Skin:  No acute rashes    Basic Metabolic Panel:  Recent Labs  Lab 10/08/20 1010 10/09/20 0445 10/10/20 0644 10/11/20 0552 10/12/20 0305 10/13/20 0632 10/14/20 0641  NA 150* 149* 144 144 141 142 143  K 3.6 3.3* 3.5 3.3* 3.9 3.5 3.3*  CL 115* 114* 111 111 110 110 109  CO2 24 23 23 23 23  21* 23  GLUCOSE 119* 128* 125* 102* 124* 94 101*  BUN 65* 59* 50* 40* 33* 24* 22  CREATININE 2.36* 2.08* 2.01* 1.97* 1.83* 1.82* 1.82*   CALCIUM 8.3* 8.3* 8.0* 8.3* 8.3* 8.3* 8.5*  MG  --  2.1 1.9 1.9 2.1 2.0 1.9  PHOS 4.5 4.2  --   --   --   --   --      CBC: Recent Labs  Lab 10/10/20 0644 10/11/20 0552 10/12/20 0305 10/13/20 0632 10/14/20 0641  WBC 8.1 6.9 6.6 6.1 6.3  HGB 9.1* 9.4* 9.8* 9.8* 10.2*  HCT 28.4* 29.6* 30.1* 30.7* 31.4*  MCV 85.3 85.1 83.8 83.7 84.0  PLT 268 272 241 256 233      Lab Results  Component Value Date   HEPBSAG NON REACTIVE 09/26/2020   HEPBSAB NON REACTIVE 09/26/2020   HEPBIGM NON REACTIVE 09/26/2020      Microbiology:  No results found for this or any previous visit (from the past 240 hour(s)).  Coagulation Studies: No results for input(s): LABPROT, INR in the last 72 hours.  Urinalysis: No results for input(s): COLORURINE, LABSPEC, PHURINE, GLUCOSEU, HGBUR, BILIRUBINUR, KETONESUR, PROTEINUR, UROBILINOGEN, NITRITE, LEUKOCYTESUR in the last 72 hours.  Invalid input(s): APPERANCEUR    Imaging: CT ABDOMEN PELVIS WO CONTRAST  Result Date: 10/13/2020 CLINICAL DATA:  Status post cholecystectomy on 09/08/2020. Subsequent abscesses with percutaneous drain placement. EXAM: CT ABDOMEN AND PELVIS WITHOUT CONTRAST TECHNIQUE: Multidetector CT imaging of the abdomen and pelvis was performed following the standard  protocol without IV contrast. COMPARISON:  Sep 30, 2020 FINDINGS: Lower chest: Mild hypoventilatory changes. Coronary and aortic atherosclerosis. Hepatobiliary: Pigtail catheter along the gallbladder fossa is situated within gas and fluid collection measuring approximately 5.4 x 2.7 x 4.0 cm, decreased from the prior measurement of 6.8 x 3.6 x 5.2 cm. High density elements are again seen in the medial aspect of this collection. Minimal perihepatic free fluid. Pancreas: Unremarkable. No pancreatic ductal dilatation or surrounding inflammatory changes. Spleen: Normal in size without focal abnormality. Adrenals/Urinary Tract: Normal adrenal glands. Bilateral perirenal fat stranding.  2 cm probable left renal cyst. Normal gallbladder. Stomach/Bowel: Stomach is within normal limits. No evidence of appendicitis. No evidence of bowel wall thickening, distention, or inflammatory changes. Sigmoid diverticulosis without evidence of diverticulitis. Vascular/Lymphatic: Aortic atherosclerosis. No enlarged abdominal or pelvic lymph nodes. Reproductive: Prostate is unremarkable. Other: Persistent subcutaneous edema in the lateral abdominal wall and hips. Mild presacral edema. Musculoskeletal: Spondylosis of the thoracolumbar spine. In seen is nonspecific rib sclerotic lesion in the lateral left 8th rib. IMPRESSION: 1. Pigtail catheter along the gallbladder fossa is situated within gas and fluid collection measuring approximately 5.4 x 2.7 x 4.0 cm, decreased from the prior measurement of 6.8 x 3.6 x 5.2 cm. High density elements are again seen in the medial aspect of this collection. 2. Minimal perihepatic free fluid. 3. Sigmoid diverticulosis without evidence of diverticulitis. 4. Persistent subcutaneous edema in the lateral abdominal wall and overlying bilateral hips. 5. Nonspecific rib sclerotic lesion in the lateral left 8th rib, stable from 2018. Aortic Atherosclerosis (ICD10-I70.0). Electronically Signed   By: Fidela Salisbury M.D.   On: 10/13/2020 17:37     Medications:   . sodium chloride Stopped (10/09/20 1150)  . sodium chloride    . sodium chloride Stopped (10/04/20 1112)  . ampicillin-sulbactam (UNASYN) IV 3 g (10/14/20 1243)  . heparin 1,800 Units/hr (10/14/20 0323)   . amLODipine  10 mg Oral Daily  . Chlorhexidine Gluconate Cloth  6 each Topical Daily  . feeding supplement  237 mL Oral TID BM  . insulin aspart  0-20 Units Subcutaneous TID WC  . insulin aspart  0-5 Units Subcutaneous QHS  . metoprolol tartrate  25 mg Oral BID  . multivitamin  1 tablet Oral QHS  . multivitamin with minerals  1 tablet Oral Daily  . pantoprazole (PROTONIX) IV  40 mg Intravenous Q24H  .  [START ON 10/15/2020] potassium chloride  20 mEq Oral Daily  . sodium chloride flush  3 mL Intravenous Q12H   sodium chloride, acetaminophen, albuterol, docusate, LORazepam, melatonin, metoprolol tartrate, ondansetron (ZOFRAN) IV, polyethylene glycol, sodium chloride flush  Assessment/ Plan:  65 y.o. male with hypertension, diabetes mellitus type II, bladder cancer, admitted on 09/25/2020 for Septic shock ([place]) [A41.9, R65.21] Altered mental status, unspecified altered mental status type [R41.82] Postprocedural intraabdominal abscess [T81.43XA] Sepsis with acute renal failure and septic shock, due to unspecified organism, unspecified acute renal failure type ([place]) [A41.9, R65.21, N17.9]   # AKI  baseline creatinine of 1.1, GFR >60 on 08/19/20.  Had dialysis 5/16 and 5/18.   Lab Results  Component Value Date   CREATININE 1.82 (H) 10/14/2020   CREATININE 1.82 (H) 10/13/2020   CREATININE 1.83 (H) 10/12/2020   05/30 0701 - 05/31 0700 In: 42 [P.O.:360; IV Piggyback:200] Out: 1180 [Urine:1150; Drains:30]  - improved appetite - Renal function continues to improve - Will require nephrology follow in our office in 2 weeks after discharge  # Sepsis, Gall bladder Abscess, S/p  robotic assisted Lap Chole 09/08/2020 S/p CT guided GB abscess drain placement by VIR, on 5/12 Blood culture positive for Bacteroides and enterococcus on 5/12 -Treating with Unasyn and fluconazole.    # hyperphosphatemia - Phosphorus 4.2 at goal Lab Results  Component Value Date   CALCIUM 8.5 (L) 10/14/2020   PHOS 4.2 10/09/2020     # Acute resp failure intubated 09/27/20-09/30/2020 Currently on room air  #Hypernatremia. Serum sodium now 143 Stable    LOS: 19   5/31/20221:08 PM  [facility] [place], [place]

## 2020-10-15 DIAGNOSIS — A419 Sepsis, unspecified organism: Secondary | ICD-10-CM | POA: Diagnosis not present

## 2020-10-15 DIAGNOSIS — E43 Unspecified severe protein-calorie malnutrition: Secondary | ICD-10-CM | POA: Diagnosis not present

## 2020-10-15 DIAGNOSIS — L0291 Cutaneous abscess, unspecified: Secondary | ICD-10-CM | POA: Diagnosis not present

## 2020-10-15 DIAGNOSIS — E870 Hyperosmolality and hypernatremia: Secondary | ICD-10-CM | POA: Diagnosis not present

## 2020-10-15 DIAGNOSIS — Z8551 Personal history of malignant neoplasm of bladder: Secondary | ICD-10-CM | POA: Diagnosis not present

## 2020-10-15 DIAGNOSIS — D649 Anemia, unspecified: Secondary | ICD-10-CM | POA: Diagnosis not present

## 2020-10-15 DIAGNOSIS — E1142 Type 2 diabetes mellitus with diabetic polyneuropathy: Secondary | ICD-10-CM | POA: Diagnosis not present

## 2020-10-15 DIAGNOSIS — J9600 Acute respiratory failure, unspecified whether with hypoxia or hypercapnia: Secondary | ICD-10-CM | POA: Diagnosis not present

## 2020-10-15 DIAGNOSIS — R6521 Severe sepsis with septic shock: Secondary | ICD-10-CM | POA: Diagnosis not present

## 2020-10-15 DIAGNOSIS — M2550 Pain in unspecified joint: Secondary | ICD-10-CM | POA: Diagnosis not present

## 2020-10-15 DIAGNOSIS — R4182 Altered mental status, unspecified: Secondary | ICD-10-CM | POA: Diagnosis not present

## 2020-10-15 DIAGNOSIS — R42 Dizziness and giddiness: Secondary | ICD-10-CM | POA: Diagnosis not present

## 2020-10-15 DIAGNOSIS — Z48815 Encounter for surgical aftercare following surgery on the digestive system: Secondary | ICD-10-CM | POA: Diagnosis not present

## 2020-10-15 DIAGNOSIS — L02211 Cutaneous abscess of abdominal wall: Secondary | ICD-10-CM | POA: Diagnosis not present

## 2020-10-15 DIAGNOSIS — I4891 Unspecified atrial fibrillation: Secondary | ICD-10-CM | POA: Diagnosis not present

## 2020-10-15 DIAGNOSIS — N179 Acute kidney failure, unspecified: Secondary | ICD-10-CM | POA: Diagnosis not present

## 2020-10-15 DIAGNOSIS — Z7401 Bed confinement status: Secondary | ICD-10-CM | POA: Diagnosis not present

## 2020-10-15 DIAGNOSIS — G9341 Metabolic encephalopathy: Secondary | ICD-10-CM | POA: Diagnosis not present

## 2020-10-15 LAB — BASIC METABOLIC PANEL
Anion gap: 8 (ref 5–15)
BUN: 20 mg/dL (ref 8–23)
CO2: 23 mmol/L (ref 22–32)
Calcium: 8.5 mg/dL — ABNORMAL LOW (ref 8.9–10.3)
Chloride: 110 mmol/L (ref 98–111)
Creatinine, Ser: 1.85 mg/dL — ABNORMAL HIGH (ref 0.61–1.24)
GFR, Estimated: 40 mL/min — ABNORMAL LOW (ref >60)
Glucose, Bld: 99 mg/dL (ref 70–99)
Potassium: 3.4 mmol/L — ABNORMAL LOW (ref 3.5–5.1)
Sodium: 141 mmol/L (ref 135–145)

## 2020-10-15 LAB — CBC
HCT: 29.1 % — ABNORMAL LOW (ref 39.0–52.0)
Hemoglobin: 9.5 g/dL — ABNORMAL LOW (ref 13.0–17.0)
MCH: 27.4 pg (ref 26.0–34.0)
MCHC: 32.6 g/dL (ref 30.0–36.0)
MCV: 83.9 fL (ref 80.0–100.0)
Platelets: 215 K/uL (ref 150–400)
RBC: 3.47 MIL/uL — ABNORMAL LOW (ref 4.22–5.81)
RDW: 18.7 % — ABNORMAL HIGH (ref 11.5–15.5)
WBC: 5.7 K/uL (ref 4.0–10.5)
nRBC: 0 % (ref 0.0–0.2)

## 2020-10-15 LAB — RESP PANEL BY RT-PCR (FLU A&B, COVID) ARPGX2
Influenza A by PCR: NEGATIVE
Influenza B by PCR: NEGATIVE
SARS Coronavirus 2 by RT PCR: NEGATIVE

## 2020-10-15 LAB — GLUCOSE, CAPILLARY
Glucose-Capillary: 122 mg/dL — ABNORMAL HIGH (ref 70–99)
Glucose-Capillary: 148 mg/dL — ABNORMAL HIGH (ref 70–99)

## 2020-10-15 LAB — MAGNESIUM: Magnesium: 1.9 mg/dL (ref 1.7–2.4)

## 2020-10-15 NOTE — Progress Notes (Signed)
Attempt to call report to [facility],  Call disconnected.

## 2020-10-15 NOTE — Progress Notes (Signed)
Richard Oconnell, [place] 10/15/20  Subjective:   Hospital day # 20  Patient seen sitting up in chair Alert and oriented States he is ready for discharge He wants to regain strength in legs Denies shortness of breath Tolerating meals   Renal: 05/31 0701 - 06/01 0700 In: 733 [P.O.:720; I.V.:3] Out: 880 [Urine:850; Drains:30] Lab Results  Component Value Date   CREATININE 1.85 (H) 10/15/2020   CREATININE 1.82 (H) 10/14/2020   CREATININE 1.82 (H) 10/13/2020      Objective:  Vital signs in last 24 hours:  Temp:  [97.7 F (36.5 C)-98.4 F (36.9 C)] 98 F (36.7 C) (06/01 1147) Pulse Rate:  [64-82] 64 (06/01 1147) Resp:  [16-20] 18 (06/01 1147) BP: (130-154)/(82-94) 135/83 (06/01 1147) SpO2:  [94 %-99 %] 99 % (06/01 1147) Weight:  [101.5 kg] 101.5 kg (06/01 0900)  Weight change:  Filed Weights   10/13/20 1100 10/14/20 0441 10/15/20 0900  Weight: 100.2 kg 103.2 kg 101.5 kg    Intake/Output:    Intake/Output Summary (Last 24 hours) at 10/15/2020 1302 Last data filed at 10/15/2020 1018 Gross per 24 hour  Intake 848 ml  Output 880 ml  Net -32 ml    Physical Exam: General:  No acute distress, sitting in chair  HEENT  [place]/AT oral mucosa moist  Pulm/lungs  Clear breath sounds , normal effort  CVS/Heart  regular rhythm, no rub or gallop  Abdomen:   Soft, nontender, BS present  Extremities:  no peripheral edema  Neurologic:  Awake and oriented  Skin:  No acute rashes    Basic Metabolic Panel:  Recent Labs  Lab 10/09/20 0445 10/10/20 0644 10/11/20 0552 10/12/20 0305 10/13/20 0632 10/14/20 0641 10/15/20 0543  NA 149*   < > 144 141 142 143 141  K 3.3*   < > 3.3* 3.9 3.5 3.3* 3.4*  CL 114*   < > 111 110 110 109 110  CO2 23   < > 23 23 21* 23 23  GLUCOSE 128*   < > 102* 124* 94 101* 99  BUN 59*   < > 40* 33* 24* 22 20  CREATININE 2.08*   < > 1.97* 1.83* 1.82* 1.82* 1.85*  CALCIUM 8.3*   < > 8.3* 8.3* 8.3* 8.5* 8.5*  MG 2.1   < > 1.9  2.1 2.0 1.9 1.9  PHOS 4.2  --   --   --   --   --   --    < > = values in this interval not displayed.     CBC: Recent Labs  Lab 10/11/20 0552 10/12/20 0305 10/13/20 0632 10/14/20 0641 10/15/20 0543  WBC 6.9 6.6 6.1 6.3 5.7  HGB 9.4* 9.8* 9.8* 10.2* 9.5*  HCT 29.6* 30.1* 30.7* 31.4* 29.1*  MCV 85.1 83.8 83.7 84.0 83.9  PLT 272 241 256 233 215      Lab Results  Component Value Date   HEPBSAG NON REACTIVE 09/26/2020   HEPBSAB NON REACTIVE 09/26/2020   HEPBIGM NON REACTIVE 09/26/2020      Microbiology:  Recent Results (from the past 240 hour(s))  Resp Panel by RT-PCR (Flu A&B, Covid)     Status: None   Collection Time: 10/15/20 11:56 AM  Result Value Ref Range Status   SARS Coronavirus 2 by RT PCR NEGATIVE NEGATIVE Final    Comment: (NOTE) SARS-CoV-2 target nucleic acids are NOT DETECTED.  The SARS-CoV-2 RNA is generally detectable in upper respiratory specimens during the acute phase of  infection. The lowest concentration of SARS-CoV-2 viral copies this assay can detect is 138 copies/mL. A negative result does not preclude SARS-Cov-2 infection and should not be used as the sole basis for treatment or other patient management decisions. A negative result may occur with  improper specimen collection/handling, submission of specimen other than nasopharyngeal swab, presence of viral mutation(s) within the areas targeted by this assay, and inadequate number of viral copies(<138 copies/mL). A negative result must be combined with clinical observations, patient history, and epidemiological information. The expected result is Negative.  Fact Sheet for Patients:  EntrepreneurPulse.com.au  Fact Sheet for Healthcare Providers:  IncredibleEmployment.be  This test is no t yet approved or cleared by the [place] FDA and  has been authorized for detection and/or diagnosis of SARS-CoV-2 by FDA under an Emergency Use Authorization  (EUA). This EUA will remain  in effect (meaning this test can be used) for the duration of the COVID-19 declaration under Section 564(b)(1) of the Act, 21 U.S.C.section 360bbb-3(b)(1), unless the authorization is terminated  or revoked sooner.       Influenza A by PCR NEGATIVE NEGATIVE Final   Influenza B by PCR NEGATIVE NEGATIVE Final    Comment: (NOTE) The Xpert Xpress SARS-CoV-2/FLU/RSV plus assay is intended as an aid in the diagnosis of influenza from Nasopharyngeal swab specimens and should not be used as a sole basis for treatment. Nasal washings and aspirates are unacceptable for Xpert Xpress SARS-CoV-2/FLU/RSV testing.  Fact Sheet for Patients: EntrepreneurPulse.com.au  Fact Sheet for Healthcare Providers: IncredibleEmployment.be  This test is not yet approved or cleared by the [place] FDA and has been authorized for detection and/or diagnosis of SARS-CoV-2 by FDA under an Emergency Use Authorization (EUA). This EUA will remain in effect (meaning this test can be used) for the duration of the COVID-19 declaration under Section 564(b)(1) of the Act, 21 U.S.C. section 360bbb-3(b)(1), unless the authorization is terminated or revoked.  Performed at [facility], [address] [place], [place] [address]     Coagulation Studies: No results for input(s): LABPROT, INR in the last 72 hours.  Urinalysis: No results for input(s): COLORURINE, LABSPEC, PHURINE, GLUCOSEU, HGBUR, BILIRUBINUR, KETONESUR, PROTEINUR, UROBILINOGEN, NITRITE, LEUKOCYTESUR in the last 72 hours.  Invalid input(s): APPERANCEUR    Imaging: CT ABDOMEN PELVIS WO CONTRAST  Result Date: 10/13/2020 CLINICAL DATA:  Status post cholecystectomy on 09/08/2020. Subsequent abscesses with percutaneous drain placement. EXAM: CT ABDOMEN AND PELVIS WITHOUT CONTRAST TECHNIQUE: Multidetector CT imaging of the abdomen and pelvis was performed following the standard  protocol without IV contrast. COMPARISON:  Sep 30, 2020 FINDINGS: Lower chest: Mild hypoventilatory changes. Coronary and aortic atherosclerosis. Hepatobiliary: Pigtail catheter along the gallbladder fossa is situated within gas and fluid collection measuring approximately 5.4 x 2.7 x 4.0 cm, decreased from the prior measurement of 6.8 x 3.6 x 5.2 cm. High density elements are again seen in the medial aspect of this collection. Minimal perihepatic free fluid. Pancreas: Unremarkable. No pancreatic ductal dilatation or surrounding inflammatory changes. Spleen: Normal in size without focal abnormality. Adrenals/Urinary Tract: Normal adrenal glands. Bilateral perirenal fat stranding. 2 cm probable left renal cyst. Normal gallbladder. Stomach/Bowel: Stomach is within normal limits. No evidence of appendicitis. No evidence of bowel wall thickening, distention, or inflammatory changes. Sigmoid diverticulosis without evidence of diverticulitis. Vascular/Lymphatic: Aortic atherosclerosis. No enlarged abdominal or pelvic lymph nodes. Reproductive: Prostate is unremarkable. Other: Persistent subcutaneous edema in the lateral abdominal wall and hips. Mild presacral edema. Musculoskeletal: Spondylosis of the thoracolumbar spine.  In seen is nonspecific rib sclerotic lesion in the lateral left 8th rib. IMPRESSION: 1. Pigtail catheter along the gallbladder fossa is situated within gas and fluid collection measuring approximately 5.4 x 2.7 x 4.0 cm, decreased from the prior measurement of 6.8 x 3.6 x 5.2 cm. High density elements are again seen in the medial aspect of this collection. 2. Minimal perihepatic free fluid. 3. Sigmoid diverticulosis without evidence of diverticulitis. 4. Persistent subcutaneous edema in the lateral abdominal wall and overlying bilateral hips. 5. Nonspecific rib sclerotic lesion in the lateral left 8th rib, stable from 2018. Aortic Atherosclerosis (ICD10-I70.0). Electronically Signed   By: Fidela Salisbury M.D.   On: 10/13/2020 17:37     Medications:   . sodium chloride Stopped (10/09/20 1150)  . sodium chloride    . sodium chloride Stopped (10/04/20 1112)   . amLODipine  10 mg Oral Daily  . amoxicillin-clavulanate  1 tablet Oral Q12H  . apixaban  5 mg Oral BID  . Chlorhexidine Gluconate Cloth  6 each Topical Daily  . feeding supplement  237 mL Oral TID BM  . insulin aspart  0-20 Units Subcutaneous TID WC  . insulin aspart  0-5 Units Subcutaneous QHS  . metoprolol tartrate  25 mg Oral BID  . multivitamin  1 tablet Oral QHS  . multivitamin with minerals  1 tablet Oral Daily  . pantoprazole (PROTONIX) IV  40 mg Intravenous Q24H  . potassium chloride  20 mEq Oral Daily  . sodium chloride flush  3 mL Intravenous Q12H   sodium chloride, acetaminophen, albuterol, docusate, LORazepam, melatonin, metoprolol tartrate, ondansetron (ZOFRAN) IV, polyethylene glycol, sodium chloride flush  Assessment/ Plan:  65 y.o. male with hypertension, diabetes mellitus type II, bladder cancer, admitted on 09/25/2020 for Septic shock ([place]) [A41.9, R65.21] Altered mental status, unspecified altered mental status type [R41.82] Postprocedural intraabdominal abscess [T81.43XA] Sepsis with acute renal failure and septic shock, due to unspecified organism, unspecified acute renal failure type ([place]) [A41.9, R65.21, N17.9]   # AKI  baseline creatinine of 1.1, GFR >60 on 08/19/20.  Had dialysis 5/16 and 5/18.   Lab Results  Component Value Date   CREATININE 1.85 (H) 10/15/2020   CREATININE 1.82 (H) 10/14/2020   CREATININE 1.82 (H) 10/13/2020   05/31 0701 - 06/01 0700 In: 733 [P.O.:720; I.V.:3] Out: 102 [Urine:850; Drains:30]  - improved appetite - Renal function may have stabilized - No further dialysis at this time - Will require nephrology follow in our office in 2 weeks after discharge  # Sepsis, Gall bladder Abscess, S/p robotic assisted Lap Chole 09/08/2020 S/p CT guided GB abscess drain  placement by [facility], on 5/12 Blood culture positive for Bacteroides and enterococcus on 5/12 -Treating with Unasyn and fluconazole.    # hyperphosphatemia - Phosphorus 4.2 at goal Lab Results  Component Value Date   CALCIUM 8.5 (L) 10/15/2020   PHOS 4.2 10/09/2020     # Acute resp failure intubated 09/27/20-09/30/2020 Currently on room air  #Hypernatremia. Serum sodium now 141 Stable    LOS: [place] 6/1/20221:02 [place], [place]

## 2020-10-15 NOTE — Plan of Care (Signed)
  Problem: Education: Goal: Knowledge of General Education information will improve Description: Including pain rating scale, medication(s)/side effects and non-pharmacologic comfort measures 10/15/2020 1339 by Alen Blew, RN Outcome: Adequate for Discharge 10/15/2020 1339 by Alen Blew, RN Outcome: Adequate for Discharge   Problem: Health Behavior/Discharge Planning: Goal: Ability to manage health-related needs will improve 10/15/2020 1339 by Alen Blew, RN Outcome: Adequate for Discharge 10/15/2020 1339 by Alen Blew, RN Outcome: Adequate for Discharge   Problem: Clinical Measurements: Goal: Ability to maintain clinical measurements within normal limits will improve 10/15/2020 1339 by Roanna Epley D, RN Outcome: Adequate for Discharge 10/15/2020 1339 by Alen Blew, RN Outcome: Adequate for Discharge Goal: Will remain free from infection 10/15/2020 1339 by Roanna Epley D, RN Outcome: Adequate for Discharge 10/15/2020 1339 by Alen Blew, RN Outcome: Adequate for Discharge Goal: Diagnostic test results will improve 10/15/2020 1339 by Alen Blew, RN Outcome: Adequate for Discharge 10/15/2020 1339 by Alen Blew, RN Outcome: Adequate for Discharge Goal: Cardiovascular complication will be avoided 10/15/2020 1339 by Roanna Epley D, RN Outcome: Adequate for Discharge 10/15/2020 1339 by Alen Blew, RN Outcome: Adequate for Discharge   Problem: Activity: Goal: Risk for activity intolerance will decrease 10/15/2020 1339 by Roanna Epley D, RN Outcome: Adequate for Discharge 10/15/2020 1339 by Alen Blew, RN Outcome: Adequate for Discharge   Problem: Nutrition: Goal: Adequate nutrition will be maintained 10/15/2020 1339 by Roanna Epley D, RN Outcome: Adequate for Discharge 10/15/2020 1339 by Alen Blew, RN Outcome: Adequate for Discharge   Problem: Elimination: Goal: Will not experience complications related to bowel  motility 10/15/2020 1339 by Alen Blew, RN Outcome: Adequate for Discharge 10/15/2020 1339 by Alen Blew, RN Outcome: Adequate for Discharge Goal: Will not experience complications related to urinary retention 10/15/2020 1339 by Alen Blew, RN Outcome: Adequate for Discharge 10/15/2020 1339 by Alen Blew, RN Outcome: Adequate for Discharge   Problem: Pain Managment: Goal: General experience of comfort will improve 10/15/2020 1339 by Alen Blew, RN Outcome: Adequate for Discharge 10/15/2020 1339 by Alen Blew, RN Outcome: Adequate for Discharge   Problem: Safety: Goal: Ability to remain free from injury will improve 10/15/2020 1339 by Roanna Epley D, RN Outcome: Adequate for Discharge 10/15/2020 1339 by Alen Blew, RN Outcome: Adequate for Discharge   Problem: Skin Integrity: Goal: Risk for impaired skin integrity will decrease 10/15/2020 1339 by Roanna Epley D, RN Outcome: Adequate for Discharge 10/15/2020 1339 by Alen Blew, RN Outcome: Adequate for Discharge   Problem: Respiratory: Goal: Ability to maintain adequate ventilation will improve 10/15/2020 1339 by Roanna Epley D, RN Outcome: Adequate for Discharge 10/15/2020 1339 by Roanna Epley D, RN Outcome: Adequate for Discharge   Problem: Fluid Volume: Goal: Hemodynamic stability will improve 10/15/2020 1339 by Alen Blew, RN Outcome: Adequate for Discharge 10/15/2020 1339 by Alen Blew, RN Outcome: Adequate for Discharge   Problem: Respiratory: Goal: Ability to maintain adequate ventilation will improve 10/15/2020 1339 by Roanna Epley D, RN Outcome: Adequate for Discharge 10/15/2020 1339 by Alen Blew, RN Outcome: Adequate for Discharge   Problem: Acute Rehab PT Goals(only PT should resolve) Goal: Pt Will Go Supine/Side To Sit Outcome: Adequate for Discharge Goal: Patient Will Transfer Sit To/From Stand Outcome: Adequate for Discharge Goal: Pt Will  Ambulate Outcome: Adequate for Discharge

## 2020-10-15 NOTE — TOC Transition Note (Signed)
Transition of Care [facility]) - CM/SW Discharge Note   Patient Details  Name: Richard Oconnell MRN: [id number] Date of Birth: 03/05/1956  Transition of Care [facility]) CM/SW Contact:  Alberteen Sam, LCSW Phone Number: 10/15/2020, 1:27 PM   Clinical Narrative:     Patient will DC to: [facility] Anticipated DC date: 10/15/20 Family notified: sister and brother Transport by: [facility]  Per MD patient ready for DC to [facility] . RN, patient, patient's family, and facility notified of DC. Discharge Summary sent to facility. RN given number for report   [phone]. DC packet on chart. Ambulance transport requested for patient.  CSW signing off.  Pricilla Riffle, LCSW    Final next level of care: Skilled Nursing Facility Barriers to Discharge: No Barriers Identified   Patient Goals and CMS Choice Patient states their goals for this hospitalization and ongoing recovery are:: to go to SNF CMS Medicare.gov Compare Post Acute Care list provided to:: Patient Choice offered to / list presented to : Patient  Discharge Placement                Patient to be transferred to facility by: [facility] Name of family member notified: sister and brother Patient and family notified of of transfer: 10/15/20  Discharge Plan and Services In-house Referral: Clinical Social Work   Post Acute Care Choice: [place]                               Social Determinants of Health ([place]) Interventions     Readmission Risk Interventions No flowsheet data found.

## 2020-10-15 NOTE — Progress Notes (Signed)
Report given to [facility], receiving facility at this time.

## 2020-10-16 ENCOUNTER — Other Ambulatory Visit: Payer: Self-pay | Admitting: Infectious Diseases

## 2020-10-16 DIAGNOSIS — K651 Peritoneal abscess: Secondary | ICD-10-CM

## 2020-10-16 DIAGNOSIS — G9341 Metabolic encephalopathy: Secondary | ICD-10-CM | POA: Diagnosis not present

## 2020-10-16 DIAGNOSIS — J9600 Acute respiratory failure, unspecified whether with hypoxia or hypercapnia: Secondary | ICD-10-CM | POA: Diagnosis not present

## 2020-10-16 DIAGNOSIS — L02211 Cutaneous abscess of abdominal wall: Secondary | ICD-10-CM | POA: Diagnosis not present

## 2020-10-16 DIAGNOSIS — N179 Acute kidney failure, unspecified: Secondary | ICD-10-CM | POA: Diagnosis not present

## 2020-10-16 DIAGNOSIS — L0291 Cutaneous abscess, unspecified: Secondary | ICD-10-CM

## 2020-10-24 ENCOUNTER — Telehealth: Payer: Self-pay

## 2020-10-24 DIAGNOSIS — J9600 Acute respiratory failure, unspecified whether with hypoxia or hypercapnia: Secondary | ICD-10-CM | POA: Diagnosis not present

## 2020-10-24 DIAGNOSIS — Z8551 Personal history of malignant neoplasm of bladder: Secondary | ICD-10-CM | POA: Diagnosis not present

## 2020-10-24 DIAGNOSIS — N179 Acute kidney failure, unspecified: Secondary | ICD-10-CM | POA: Diagnosis not present

## 2020-10-24 DIAGNOSIS — Z48815 Encounter for surgical aftercare following surgery on the digestive system: Secondary | ICD-10-CM | POA: Diagnosis not present

## 2020-10-24 DIAGNOSIS — E1142 Type 2 diabetes mellitus with diabetic polyneuropathy: Secondary | ICD-10-CM | POA: Diagnosis not present

## 2020-10-24 DIAGNOSIS — E43 Unspecified severe protein-calorie malnutrition: Secondary | ICD-10-CM | POA: Diagnosis not present

## 2020-10-24 DIAGNOSIS — G9341 Metabolic encephalopathy: Secondary | ICD-10-CM | POA: Diagnosis not present

## 2020-10-24 DIAGNOSIS — D649 Anemia, unspecified: Secondary | ICD-10-CM | POA: Diagnosis not present

## 2020-10-24 DIAGNOSIS — I4891 Unspecified atrial fibrillation: Secondary | ICD-10-CM | POA: Diagnosis not present

## 2020-10-24 DIAGNOSIS — L02211 Cutaneous abscess of abdominal wall: Secondary | ICD-10-CM | POA: Diagnosis not present

## 2020-10-29 ENCOUNTER — Other Ambulatory Visit: Payer: Self-pay

## 2020-10-29 ENCOUNTER — Ambulatory Visit
Admission: RE | Admit: 2020-10-29 | Discharge: 2020-10-29 | Disposition: A | Discharge status: Home / Self Care | Payer: Medicare Other | Source: Ambulatory Visit | Attending: Radiology | Admitting: Radiology

## 2020-10-29 ENCOUNTER — Ambulatory Visit: Payer: Self-pay | Admitting: Pharmacist

## 2020-10-29 ENCOUNTER — Ambulatory Visit
Admission: RE | Admit: 2020-10-29 | Discharge: 2020-10-29 | Disposition: A | Discharge status: Home / Self Care | Payer: Medicare Other | Source: Ambulatory Visit | Attending: Infectious Diseases | Admitting: Infectious Diseases

## 2020-10-29 ENCOUNTER — Other Ambulatory Visit: Payer: Self-pay | Admitting: Infectious Diseases

## 2020-10-29 ENCOUNTER — Encounter: Payer: Self-pay | Admitting: *Deleted

## 2020-10-29 DIAGNOSIS — K810 Acute cholecystitis: Secondary | ICD-10-CM | POA: Diagnosis not present

## 2020-10-29 DIAGNOSIS — K828 Other specified diseases of gallbladder: Secondary | ICD-10-CM | POA: Diagnosis not present

## 2020-10-29 DIAGNOSIS — E1169 Type 2 diabetes mellitus with other specified complication: Secondary | ICD-10-CM

## 2020-10-29 DIAGNOSIS — K651 Peritoneal abscess: Secondary | ICD-10-CM

## 2020-10-29 DIAGNOSIS — N281 Cyst of kidney, acquired: Secondary | ICD-10-CM | POA: Diagnosis not present

## 2020-10-29 DIAGNOSIS — L0291 Cutaneous abscess, unspecified: Secondary | ICD-10-CM

## 2020-10-29 DIAGNOSIS — Z978 Presence of other specified devices: Secondary | ICD-10-CM | POA: Diagnosis not present

## 2020-10-29 DIAGNOSIS — E1142 Type 2 diabetes mellitus with diabetic polyneuropathy: Secondary | ICD-10-CM

## 2020-10-29 DIAGNOSIS — I10 Essential (primary) hypertension: Secondary | ICD-10-CM

## 2020-10-29 DIAGNOSIS — I480 Paroxysmal atrial fibrillation: Secondary | ICD-10-CM

## 2020-10-29 DIAGNOSIS — K6811 Postprocedural retroperitoneal abscess: Secondary | ICD-10-CM | POA: Diagnosis not present

## 2020-10-29 HISTORY — PX: IR RADIOLOGIST EVAL & MGMT: IMG5224

## 2020-10-29 NOTE — Progress Notes (Signed)
Referring Physician(s): Bruning,Kevin  Chief Complaint: The patient is seen in follow up today s/p gall bladder fossa abscess  History of present illness: Richard Oconnell is a 65 year old male with past medical history of HTN, DM, and bladder cancer who was admitted with acute cholecystitis 07/10/20.  Patient underwent percutaneous cholecystostomy tube placement as he was deemed high risk for surgery due to new onset a fib and hypotension.  At subsequent drain evaluation 3/24, his cystic duct remained obstructed, his drain remained in place, and he was ultimately able to undergo cholecystectomy 09/08/20 with Dr. Celine Ahr. He returned to the ED 09/25/20 with confusion and concern for sepsis. CT imaging showed a large abscess in the gall bladder fossa.  He underwent gall bladder fossa drain placement 09/25/20 which was upsized to a 14 Fr drain 10/02/20.  At last drain evaluation 5/30, his abscess was smaller, however not yet completely resolved and his drain was left in place for an additional 2 weeks.  He returns to IR drain clinic today for evaluation and management.   Richard Oconnell reports he is feeling better.  His drain remains in place without issue.  He has been flushing daily with trace/minimal output.  He is still taking antibiotics.   Past Medical History:  Diagnosis Date   Cancer [facility])    Bladder Cancer   Erectile dysfunction associated with type 2 diabetes mellitus ([place]) 05/26/2016   Gross hematuria 11/16/2016   Hypertension 06/11/2015   Type 2 diabetes mellitus with diabetic polyneuropathy, without long-term current use of insulin ([place]) 06/11/2015    Past Surgical History:  Procedure Laterality Date   boil lanceted     hand   COLONOSCOPY WITH PROPOFOL N/A 07/03/2020   Procedure: COLONOSCOPY WITH PROPOFOL;  Surgeon: Jonathon Bellows, MD;  Location: [facility] ENDOSCOPY;  Service: Gastroenterology;  Laterality: N/A;   CYSTOSCOPY W/ RETROGRADES Bilateral 12/06/2016   Procedure: CYSTOSCOPY WITH RETROGRADE  PYELOGRAM;  Surgeon: Hollice Espy, MD;  Location: [facility] ORS;  Service: Urology;  Laterality: Bilateral;   CYSTOSCOPY W/ RETROGRADES Bilateral 06/27/2017   Procedure: CYSTOSCOPY WITH RETROGRADE PYELOGRAM;  Surgeon: Hollice Espy, MD;  Location: [facility] ORS;  Service: Urology;  Laterality: Bilateral;   CYSTOSCOPY WITH BIOPSY N/A 06/27/2017   Procedure: CYSTOSCOPY WITH Bladder BIOPSY;  Surgeon: Hollice Espy, MD;  Location: [facility] ORS;  Service: Urology;  Laterality: N/A;   CYSTOSCOPY WITH BIOPSY N/A 10/24/2017   Procedure: CYSTOSCOPY WITH Bladder BIOPSY;  Surgeon: Hollice Espy, MD;  Location: [facility] ORS;  Service: Urology;  Laterality: N/A;   ingrown  Bilateral    ingrown toenail   IR BILIARY DRAIN PLACEMENT WITH CHOLANGIOGRAM  07/10/2020   IR CHOLANGIOGRAM EXISTING TUBE  08/07/2020   IR RADIOLOGIST EVAL & MGMT  08/07/2020   TRANSURETHRAL RESECTION OF BLADDER TUMOR N/A 12/06/2016   Procedure: TRANSURETHRAL RESECTION OF BLADDER TUMOR (TURBT) (2-5cm) CLOT EVACUATION;  Surgeon: Hollice Espy, MD;  Location: [facility] ORS;  Service: Urology;  Laterality: N/A;   TRANSURETHRAL RESECTION OF BLADDER TUMOR N/A 06/27/2017   Procedure: TRANSURETHRAL RESECTION OF BLADDER TUMOR (TURBT);  Surgeon: Hollice Espy, MD;  Location: [facility] ORS;  Service: Urology;  Laterality: N/A;   TRANSURETHRAL RESECTION OF BLADDER TUMOR WITH MITOMYCIN-C N/A 01/18/2017   Procedure: TRANSURETHRAL RESECTION OF BLADDER TUMOR WITH MITOMYCIN-C-(SMALL);  Surgeon: Hollice Espy, MD;  Location: [facility] ORS;  Service: Urology;  Laterality: N/A;    Allergies: Cyclobenzaprine and Lisinopril  Medications: Prior to Admission medications   Medication Sig Start Date End Date Taking? Authorizing Provider  acetaminophen (  TYLENOL) 325 MG tablet Take 1 tablet (325 mg total) by mouth every 6 (six) hours as needed for mild pain, fever or headache (or Fever >/= 101). Patient not taking: No sig reported 07/12/20   Cherene Altes, MD  amLODipine (NORVASC)  10 MG tablet Take 1 tablet (10 mg total) by mouth daily. 10/15/20   Kathie Dike, MD  amoxicillin-clavulanate (AUGMENTIN) 875-125 MG tablet Take 1 tablet by mouth every 12 (twelve) hours. For 2 weeks 10/14/20   Kathie Dike, MD  apixaban (ELIQUIS) 5 MG TABS tablet Take 1 tablet (5 mg total) by mouth 2 (two) times daily. 10/14/20   Kathie Dike, MD  metoprolol tartrate (LOPRESSOR) 25 MG tablet Take 1 tablet (25 mg total) by mouth 2 (two) times daily. 10/15/20   Kathie Dike, MD  OZEMPIC, 0.25 OR 0.5 MG/DOSE, 2 MG/1.5ML SOPN Inject 1 mg into the skin once a week. Patient taking differently: Inject 1 mg into the skin every Thursday. 05/30/20   Karamalegos, Devonne Doughty, DO  pravastatin (PRAVACHOL) 20 MG tablet Take 1 tablet (20 mg total) by mouth daily. 05/30/20   [name], DO     Family History  Problem Relation Age of Onset   Heart attack Mother    Heart disease Mother    Parkinson's disease Father    Heart attack Maternal Grandfather    Heart disease Maternal Grandfather    Heart attack Maternal Aunt    Heart attack Maternal Grandmother    Prostate cancer Neg Hx    Bladder Cancer Neg Hx    Kidney cancer Neg Hx     Social History   Socioeconomic History   Marital status: Divorced    Spouse name: Not on file   Number of children: Not on file   Years of education: Not on file   Highest education level: Not on file  Occupational History   Not on file  Tobacco Use   Smoking status: Never   Smokeless tobacco: Never   Tobacco comments:    once a while had one cigar >10 years ago  Vaping Use   Vaping Use: Never used  Substance and Sexual Activity   Alcohol use: No    Alcohol/week: 0.0 standard drinks   Drug use: No   Sexual activity: Never  Other Topics Concern   Not on file  Social History Narrative   Lives alone   Social Determinants of Health   Financial Resource Strain: Not on file  Food Insecurity: Not on file  Transportation Needs: Not on file   Physical Activity: Not on file  Stress: Not on file  Social Connections: Not on file     Vital Signs: There were no vitals taken for this visit.  Physical Exam NAD, alert Abdomen: soft, non-tender.  Drain remains in place.  Insertion site intact.  Small amount of thinning, cloudy fluid with debris.   Imaging: No results found.  Labs:  CBC: Recent Labs    10/12/20 0305 10/13/20 0632 10/14/20 0641 10/15/20 0543  WBC 6.6 6.1 6.3 5.7  HGB 9.8* 9.8* 10.2* 9.5*  HCT 30.1* 30.7* 31.4* 29.1*  PLT 241 256 233 215    COAGS: Recent Labs    09/25/20 1444 10/04/20 0627  INR 1.5* 1.2  APTT 40* 38*    BMP: Recent Labs    07/21/20 1108 08/19/20 1000 09/09/20 0427 10/12/20 0305 10/13/20 0632 10/14/20 0641 10/15/20 0543  NA 137 136   < > 141 142 143 141  K 4.5  4.6   < > 3.9 3.5 3.3* 3.4*  CL 102 99   < > 110 110 109 110  CO2 26 28   < > 23 21* 23 23  GLUCOSE 92 102*   < > 124* 94 101* 99  BUN 13 18   < > 33* 24* 22 20  CALCIUM 9.0 9.5   < > 8.3* 8.3* 8.5* 8.5*  CREATININE 1.10 1.10   < > 1.83* 1.82* 1.82* 1.85*  GFRNONAA 70 70   < > 40* 41* 41* 40*  GFRAA 81 81  --   --   --   --   --    < > = values in this interval not displayed.    LIVER FUNCTION TESTS: Recent Labs    09/29/20 0456 09/30/20 0514 10/03/20 0356 10/04/20 0627 10/06/20 0419 10/07/20 0331 10/08/20 1010 10/09/20 0445  BILITOT 2.2*  --  1.2  --  1.2 1.2  --   --   AST 55*  --  39  --  67* 64*  --   --   ALT 42  --  24  --  62* 64*  --   --   ALKPHOS 213*  --  168*  --  156* 156*  --   --   PROT 5.8*  --  5.6*  --  6.4* 6.8  --   --   ALBUMIN 1.8*   < > 1.9*  1.9*   < > 2.3* 2.3*  2.3* 2.3* 2.4*   < > = values in this interval not displayed.    Assessment: Gall bladder fossa abscess s/p percutaneous drain placement 09/25/20, upsized to 14 Fr 10/02/20. Richard Oconnell returns to IR drain clinic today for evaluation and management of his gall bladder fossa drain.  He has been maintaining his  drain at home, flushing daily.  Does report that with flushing today he had leakage with return of flush around the drain tract but continues to have some minimal output daily.  CT Abdomen Pelvis obtained and reviewed today by Dr. Earleen Newport who notes improvement but not resolve of his fluid collection.   Recommends stopping flushes and maintaining gravity drainage for additional 2-4 weeks.   Richard Oconnell should continue his antibiotic regimen and maintain follow-up with ID and PCP.  Repeat CT non-con Abd Pelvis at next follow-up for possible drain removal.  Schedulers will contact patient with date and time of appointment.    Signed: Docia Barrier, PA 10/29/2020, 12:33 PM   Please refer to Dr. Earleen Newport attestation of this note for management and plan.

## 2020-10-29 NOTE — Chronic Care Management (AMB) (Signed)
Chronic Care Management Pharmacy Note  10/29/2020 Name:  Richard Oconnell MRN:  [id number] DOB:  Oct 04, 1955   Subjective: Richard Oconnell is an 65 y.o. year old male who is a primary patient of [name], DO.  The CCM team was consulted for assistance with disease management and care coordination needs.    Engaged with patient by telephone for follow up visit in response to provider referral for pharmacy case management and/or care coordination services.   Consent to Services:  The patient was given information about Chronic Care Management services, agreed to services, and gave verbal consent prior to initiation of services.  Please see initial visit note for detailed documentation.   Patient Care Team: [name], DO as PCP - General (Family Medicine) Sumiye Hirth, Virl Diamond, RPH-CPP as [facility] visits: Medication Reconciliation was completed by comparing discharge summary, patient's EMR and Pharmacy list, and upon discussion with patient.  Admitted to the hospital on 09/25/2020 due to sepsis with acute renal failure and septic shock related to abscess. Discharge date was 10/15/2020. Discharged from [facility] to SNF  New?Medications Started at [facility] Discharge:?? -amlodipine 10 mg daily -amoxicillin-clavulanate 875-125 mg every 12 hours for 2 weeks -Eliquis 5 mg twice daily  Medication Changes at Hospital Discharge: -Changed metoprolol tartrate increased to 25 mg twice daily  Medications Discontinued at Hospital Discharge: -diphenhydramine-acetaminophen -ibuprofen -oxycodone -zolpidem  Medications that remain the same after Hospital Discharge:??  -All other medications will remain the same.    Patient admitted to [facility] SNF from 6/1 to 6/10  Objective:  Lab Results  Component Value Date   CREATININE 1.85 (H) 10/15/2020   CREATININE 1.82 (H) 10/14/2020   CREATININE 1.82 (H) 10/13/2020     Lab Results  Component Value Date   HGBA1C 6.7 (H) 09/26/2020   Last diabetic Eye exam: No results found for: HMDIABEYEEXA  Last diabetic Foot exam: No results found for: HMDIABFOOTEX      Component Value Date/Time   CHOL 105 07/21/2020 1108   TRIG 226 (H) 09/28/2020 0418   HDL 33 (L) 07/21/2020 1108   CHOLHDL 3.2 07/21/2020 1108   VLDL 51 (H) 11/24/2016 0818   LDLCALC 45 07/21/2020 1108    Hepatic Function Latest Ref Rng & Units 10/09/2020 10/08/2020 10/07/2020  Total Protein 6.5 - 8.1 g/dL - - -  Albumin 3.5 - 5.0 g/dL 2.4(L) 2.3(L) 2.3(L)  AST 15 - 41 U/L - - -  ALT 0 - 44 U/L - - -  Alk Phosphatase 38 - 126 U/L - - -  Total Bilirubin 0.3 - 1.2 mg/dL - - -  Bilirubin, Direct 0.0 - 0.2 mg/dL - - -    CBC Latest Ref Rng & Units 10/15/2020 10/14/2020 10/13/2020  WBC 4.0 - 10.5 K/uL 5.7 6.3 6.1  Hemoglobin 13.0 - 17.0 g/dL 9.5(L) 10.2(L) 9.8(L)  Hematocrit 39.0 - 52.0 % 29.1(L) 31.4(L) 30.7(L)  Platelets 150 - 400 K/uL 215 233 256    Social History   Tobacco Use  Smoking Status Never  Smokeless Tobacco Never  Tobacco Comments   once a while had one cigar >10 years ago   BP Readings from Last 3 Encounters:  10/15/20 135/83  09/16/20 (!) 142/90  09/14/20 122/85   Pulse Readings from Last 3 Encounters:  10/15/20 64  09/16/20 76  09/14/20 76   Wt Readings from Last 3 Encounters:  10/15/20 223 lb 12.8 oz (101.5 kg)  09/16/20 250 lb 3.2  oz (113.5 kg)  09/14/20 246 lb 14.6 oz (112 kg)    Assessment: Review of patient past medical history, allergies, medications, health status, including review of consultants reports, laboratory and other test data, was performed as part of comprehensive evaluation and provision of chronic care management services.   SDOH:  (Social Determinants of Health) assessments and interventions performed: none   CCM Care Plan  Allergies  Allergen Reactions   Cyclobenzaprine Other (See Comments)    Not improving pain and makes him  agressive.   Lisinopril Cough    Medications Reviewed Today     Reviewed by Vella Raring, RPH-CPP (Pharmacist) on 10/29/20 at 62  Med List Status: <None>   Medication Order Taking? Sig Documenting Provider Last Dose Status Informant  acetaminophen (TYLENOL) 325 MG tablet [id number] No Take 1 tablet (325 mg total) by mouth every 6 (six) hours as needed for mild pain, fever or headache (or Fever >/= 101).  Patient not taking: No sig reported   Cherene Altes, MD Not Taking Active Self  amLODipine (NORVASC) 10 MG tablet [id number] Yes Take 1 tablet (10 mg total) by mouth daily. Kathie Dike, MD Taking Active   amoxicillin-clavulanate (AUGMENTIN) 875-125 MG tablet [id number] Yes Take 1 tablet by mouth every 12 (twelve) hours. For 2 weeks Kathie Dike, MD Taking Active   apixaban (ELIQUIS) 5 MG TABS tablet [id number] Yes Take 1 tablet (5 mg total) by mouth 2 (two) times daily. Kathie Dike, MD Taking Active   metoprolol tartrate (LOPRESSOR) 25 MG tablet [id number] Yes Take 1 tablet (25 mg total) by mouth 2 (two) times daily. Kathie Dike, MD Taking Active   OZEMPIC, 0.25 OR 0.5 MG/DOSE, 2 MG/1.5ML SOPN [id number] Yes Inject 1 mg into the skin once a week.  Patient taking differently: Inject 1 mg into the skin every Thursday.   [name], DO Taking Active            Med Note Kellie Simmering, Jenene Slicker   Fri Aug 15, 2020  2:42 PM)    pravastatin (PRAVACHOL) 20 MG tablet [id number] Yes Take 1 tablet (20 mg total) by mouth daily. [name], DO Taking Active Self            Patient Active Problem List   Diagnosis Date Noted   Pressure injury of skin 10/13/2020   Protein-calorie malnutrition, severe 10/10/2020   Postprocedural intraabdominal abscess    Acute respiratory failure ([place]) 09/28/2020   Encounter for nasogastric (NG) tube placement    Altered mental status    Abscess    Acute kidney failure, unspecified ([place])    Sepsis with acute renal  failure and septic shock ([place]) 09/25/2020   S/P laparoscopic cholecystectomy 01/77/9390   Metabolic syndrome 30/01/2329   Atrial fibrillation ([place]) 07/09/2020   Class 1 obesity due to excess calories with serious comorbidity and body mass index (BMI) of 33.0 to 33.9 in adult 07/06/2019   Malignant neoplasm of urinary bladder ([place]) 12/06/2018   Overweight (BMI 25.0-29.9) 07/04/2018   Hyperlipidemia associated with type 2 diabetes mellitus ([place]) 11/30/2016   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   Gross hematuria 11/16/2016   Erectile dysfunction associated with type 2 diabetes mellitus ([place]) 05/26/2016   Diabetes mellitus with coincident hypertension ([place]) 06/11/2015   Type 2 diabetes mellitus with diabetic polyneuropathy, without long-term current use of insulin ([place]) 06/11/2015    Immunization History  Administered Date(s) Administered   Moderna Sars-Covid-2 Vaccination 11/30/2019, 12/28/2019   Pneumococcal Conjugate-13 05/30/2020    Conditions  to be addressed/monitored: T2DM, HTN, atrial fibrillation, HLD  Care Plan : PharmD - Medication Assistance  Updates made by Vella Raring, RPH-CPP since 10/29/2020 12:00 AM     Problem: Disease Progression      Long-Range Goal: Disease Progression Prevented or Minimized   Start Date: 06/06/2020  Expected End Date: 09/04/2020  This Visit's Progress: On track  Recent Progress: On track  Priority: High  Note:   Current Barriers:  Difficulty with independently affording treatment regimen. Reports cost of Ozempic is difficult to afford, particularly when enters coverage gap of [name] Patient APPROVED for patient assistance from [place] through 05/16/2021  Pharmacist Clinical Goal(s):  Over the next 90 days, patient will verbalize ability to afford treatment regimen through collaboration with PharmD and provider.   Interventions: 1:1 collaboration with [name], DO regarding development and update of  comprehensive plan of care as evidenced by provider attestation and co-signature Inter-disciplinary care team collaboration (see longitudinal plan of care) Perform chart review Patient admitted to [facility] from 5/12 to 6/1 for sepsis with acute renal failure and septic shock related to abscess Patient discharged to SNF Patient reports he was discharged home from [facility] facility on 6/10 Confirms planning to attend appointments with Radiology today, Infectious Disease provider tomorrow and PCP on 6/17 and reports sibilings will join him Comprehensive medication review performed; medication list updated in electronic medical record Patient confirms taking Augmentin twice daily as directed. Reports has 3 doses remaining to complete course  Type 2 Diabetes: Current treatment: Ozempic 71m weekly Reports last checked morning fasting blood sugar on 6/13: 125   Hypertension/ Atrial Fibrillation Current treatment: Eliquis 5 mg twice daily Metoprolol 25 mg twice daily Amlodipine 10 mg daily Reports recent home BP/HR readings: 6/13: 131/86, HR 102 (active prior to reading) 6/14: 115/80, HR 92; PM: 141/79, HR 81 6/15: 141/76, HR 89 Counsel on BP monitoring technique Encourage patient to continue to monitor home BP/HR, keep log of results and bring record with him to upcoming appointment with PCP   Hyperlipidemia:  Controlled; current treatment: pravastatin 20 mg daily  Medication Adherence: Reports currently taking medications from pill bottles Advise and patient agrees to start using weekly pillbox as adherence tool  Medication Assistance: Patient interested in discussing patient assistance for Eliquis during next telephone call [employer] today for current out of pocket total for 2022 calendar year  Patient Goals/Self-Care Activities Over the next 90 days, patient will:  - check glucose, document, and provide at future appointments -  check blood pressure, document, and provide at future appointments - collaborate with provider on medication access solutions - attend medical appointments as scheduled   Appointment with Radiology today Appointment with Infectious Disease on 6/16 Appointment with PCP on 6/17   Follow Up Plan:  Telephone follow up appointment with care management team member scheduled for: 6/22 at 9:45 am      Patient's preferred pharmacy is:  [facility] [place] [place]Phone: [phone]: [address] [address]: [phone] Fax: [address] [place]Phone: [phone]: [phone]  Follow Up:  Patient  agrees to Care Plan and Follow-up.  Harlow Asa, PharmD, [place], CPP Clinical Pharmacist [facility] [phone]

## 2020-10-29 NOTE — Patient Instructions (Signed)
Visit Information  PATIENT GOALS:  Goals Addressed             This Visit's Progress    Pharmacy Goals       Our goal bad cholesterol, or LDL, is less than 70 . This is why it is important to continue taking your pravastatin  Please check your home blood pressure, keep a log of the results and bring this with you to your medical appointments.  Feel free to call me with any questions or concerns. I look forward to our next call!   Harlow Asa, PharmD, [place] [phone]         The patient verbalized understanding of instructions, educational materials, and care plan provided today and declined offer to receive copy of patient instructions, educational materials, and care plan.   Telephone follow up appointment with care management team member scheduled for: 6/22 at 9:45 am

## 2020-10-30 ENCOUNTER — Ambulatory Visit: Payer: Medicare Other | Attending: Infectious Diseases | Admitting: Infectious Diseases

## 2020-10-30 ENCOUNTER — Other Ambulatory Visit
Admission: RE | Admit: 2020-10-30 | Discharge: 2020-10-30 | Disposition: A | Discharge status: Home / Self Care | Payer: Medicare Other | Source: Ambulatory Visit | Attending: Infectious Diseases | Admitting: Infectious Diseases

## 2020-10-30 ENCOUNTER — Telehealth: Payer: Self-pay

## 2020-10-30 ENCOUNTER — Ambulatory Visit: Payer: Medicare Other | Admitting: Family Medicine

## 2020-10-30 DIAGNOSIS — K651 Peritoneal abscess: Secondary | ICD-10-CM | POA: Diagnosis not present

## 2020-10-30 DIAGNOSIS — Z9049 Acquired absence of other specified parts of digestive tract: Secondary | ICD-10-CM | POA: Diagnosis not present

## 2020-10-30 DIAGNOSIS — Z888 Allergy status to other drugs, medicaments and biological substances status: Secondary | ICD-10-CM | POA: Insufficient documentation

## 2020-10-30 DIAGNOSIS — B952 Enterococcus as the cause of diseases classified elsewhere: Secondary | ICD-10-CM | POA: Diagnosis not present

## 2020-10-30 DIAGNOSIS — L02211 Cutaneous abscess of abdominal wall: Secondary | ICD-10-CM | POA: Diagnosis not present

## 2020-10-30 DIAGNOSIS — Z79899 Other long term (current) drug therapy: Secondary | ICD-10-CM | POA: Insufficient documentation

## 2020-10-30 DIAGNOSIS — Z7901 Long term (current) use of anticoagulants: Secondary | ICD-10-CM | POA: Insufficient documentation

## 2020-10-30 LAB — CBC WITH DIFFERENTIAL/PLATELET
Abs Immature Granulocytes: 0.03 10*3/uL (ref 0.00–0.07)
Basophils Absolute: 0.1 10*3/uL (ref 0.0–0.1)
Basophils Relative: 1 %
Eosinophils Absolute: 0.2 10*3/uL (ref 0.0–0.5)
Eosinophils Relative: 2 %
HCT: 36.1 % — ABNORMAL LOW (ref 39.0–52.0)
Hemoglobin: 11.8 g/dL — ABNORMAL LOW (ref 13.0–17.0)
Immature Granulocytes: 0 %
Lymphocytes Relative: 24 %
Lymphs Abs: 2.6 10*3/uL (ref 0.7–4.0)
MCH: 28 pg (ref 26.0–34.0)
MCHC: 32.7 g/dL (ref 30.0–36.0)
MCV: 85.7 fL (ref 80.0–100.0)
Monocytes Absolute: 0.7 10*3/uL (ref 0.1–1.0)
Monocytes Relative: 6 %
Neutro Abs: 7.1 10*3/uL (ref 1.7–7.7)
Neutrophils Relative %: 67 %
Platelets: 342 10*3/uL (ref 150–400)
RBC: 4.21 MIL/uL — ABNORMAL LOW (ref 4.22–5.81)
RDW: 18.7 % — ABNORMAL HIGH (ref 11.5–15.5)
WBC: 10.8 10*3/uL — ABNORMAL HIGH (ref 4.0–10.5)
nRBC: 0 % (ref 0.0–0.2)

## 2020-10-30 LAB — COMPREHENSIVE METABOLIC PANEL
ALT: 28 U/L (ref 0–44)
AST: 25 U/L (ref 15–41)
Albumin: 4 g/dL (ref 3.5–5.0)
Alkaline Phosphatase: 103 U/L (ref 38–126)
Anion gap: 9 (ref 5–15)
BUN: 34 mg/dL — ABNORMAL HIGH (ref 8–23)
CO2: 23 mmol/L (ref 22–32)
Calcium: 9.4 mg/dL (ref 8.9–10.3)
Chloride: 106 mmol/L (ref 98–111)
Creatinine, Ser: 1.32 mg/dL — ABNORMAL HIGH (ref 0.61–1.24)
GFR, Estimated: 60 mL/min — ABNORMAL LOW (ref >60)
Glucose, Bld: 126 mg/dL — ABNORMAL HIGH (ref 70–99)
Potassium: 4.6 mmol/L (ref 3.5–5.1)
Sodium: 138 mmol/L (ref 135–145)
Total Bilirubin: 0.9 mg/dL (ref 0.3–1.2)
Total Protein: 8.3 g/dL — ABNORMAL HIGH (ref 6.5–8.1)

## 2020-10-30 MED ORDER — AMOXICILLIN-POT CLAVULANATE 875-125 MG PO TABS: 1.0000 | ORAL_TABLET | Freq: Two times a day (BID) | ORAL | 0 refills | Status: DC

## 2020-10-30 NOTE — Progress Notes (Signed)
NAME: Donzell Coller  DOB: 1955/10/11  MRN: [id number]  Date/Time: 10/30/2020 10:26 AM   Subjective:  Follow-up after recent hospitalization. Patient has a complicated intra-abdominal surgical history. ? Ramesh Moan is a 65 y.o. male with acute cholecystitis. Underwent percutaneous cholecystotomy on July 10, 2020.  Underwent laparoscopic cholecystectomy on September 08, 2020.  DC'd on 09/09/2020 on oral Augmentin for 7 days with a JP drain.. Sep 16, 2020 JP drain removed by surgery as outpatient. Presented on 09/25/2020 with recurrent abdominal pain and chills confusion and weakness.  CT abdomen showed very large abscess in the gallbladder fossa measuring 10 cm in diameter.  And a markedly elevated white count.  He also had elevated LFTs.  He underwent IR drainage of 200 cc of pus.  Was in the ICU and on pressors. Seen by Dr. Ola Spurr on 09/26/2020.  He was initially on IV Zosyn.  Blood culture from 09/25/2020 grew Enterococcus faecalis.  Intra-abdominal abscess culture was positive for Enterococcus faecalis and Bacteroides fragilis.  Antibiotic was changed to ampicillin sulbactam.  Repeat blood culture on 09/30/2020 was negative. A repeat CT abdomen and pelvis done on 09/30/2020 showed the gallbladder fossa abscess measuring 6.8 x 3.6 x 5.2 cm with the pigtail catheter in place he was in AKI and had to receive short-term hemodialysis.  On discharge on 10/14/2020 his creatinine was 1.82.  He was asked to take 2 weeks of Augmentin and follow-up with interventional radiology surgery and ID myself. Yesterday he had a CAT scan which showed the abscess to be almost resolved except for some inflammation around with some gas and fluid.  So Dr. Earleen Newport decided to leave the catheter in for couple of weeks. He is here today with his sister. He is doing well He has no fever or chills or abdominal pain or nausea or vomiting.  Today is the last day of his Augmentin. He says today he saw more discharge  from the drain and it is milky brown in color. Past Medical History:  Diagnosis Date   Cancer [facility])    Bladder Cancer   Erectile dysfunction associated with type 2 diabetes mellitus ([place]) 05/26/2016   Gross hematuria 11/16/2016   Hypertension 06/11/2015   Type 2 diabetes mellitus with diabetic polyneuropathy, without long-term current use of insulin ([place]) 06/11/2015    Past Surgical History:  Procedure Laterality Date   boil lanceted     hand   COLONOSCOPY WITH PROPOFOL N/A 07/03/2020   Procedure: COLONOSCOPY WITH PROPOFOL;  Surgeon: Jonathon Bellows, MD;  Location[facility]COPY;  Service: Gastroenterology;  Laterality: N/A;   CYSTOSCOPY W/ RETROGRADES Bilateral 12/06/2016   Procedure: CYSTOSCOPY WITH RETROGRADE PYELOGRAM;  Surgeon: Hollice Espy, MD;  Location: [facility] ORS;  Service: Urology;  Laterality: Bilateral;   CYSTOSCOPY W/ RETROGRADES Bilateral 06/27/2017   Procedure: CYSTOSCOPY WITH RETROGRADE PYELOGRAM;  Surgeon: Hollice Espy, MD;  Location: [facility] ORS;  Service: Urology;  Laterality: Bilateral;   CYSTOSCOPY WITH BIOPSY N/A 06/27/2017   Procedure: CYSTOSCOPY WITH Bladder BIOPSY;  Surgeon: Hollice Espy, MD;  Location: [facility] ORS;  Service: Urology;  Laterality: N/A;   CYSTOSCOPY WITH BIOPSY N/A 10/24/2017   Procedure: CYSTOSCOPY WITH Bladder BIOPSY;  Surgeon: Hollice Espy, MD;  Location: [facility] ORS;  Service: Urology;  Laterality: N/A;   ingrown  Bilateral    ingrown toenail   IR BILIARY DRAIN PLACEMENT WITH CHOLANGIOGRAM  07/10/2020   IR CHOLANGIOGRAM EXISTING TUBE  08/07/2020   IR RADIOLOGIST EVAL & MGMT  08/07/2020   IR RADIOLOGIST EVAL &  MGMT  10/29/2020   TRANSURETHRAL RESECTION OF BLADDER TUMOR N/A 12/06/2016   Procedure: TRANSURETHRAL RESECTION OF BLADDER TUMOR (TURBT) (2-5cm) CLOT EVACUATION;  Surgeon: Hollice Espy, MD;  Location: [facility] ORS;  Service: Urology;  Laterality: N/A;   TRANSURETHRAL RESECTION OF BLADDER TUMOR N/A 06/27/2017   Procedure: TRANSURETHRAL RESECTION OF  BLADDER TUMOR (TURBT);  Surgeon: Hollice Espy, MD;  Location: [facility] ORS;  Service: Urology;  Laterality: N/A;   TRANSURETHRAL RESECTION OF BLADDER TUMOR WITH MITOMYCIN-C N/A 01/18/2017   Procedure: TRANSURETHRAL RESECTION OF BLADDER TUMOR WITH MITOMYCIN-C-(SMALL);  Surgeon: Hollice Espy, MD;  Location: [facility] ORS;  Service: Urology;  Laterality: N/A;    Social History   Socioeconomic History   Marital status: Divorced    Spouse name: Not on file   Number of children: Not on file   Years of education: Not on file   Highest education level: Not on file  Occupational History   Not on file  Tobacco Use   Smoking status: Never   Smokeless tobacco: Never   Tobacco comments:    once a while had one cigar >10 years ago  Vaping Use   Vaping Use: Never used  Substance and Sexual Activity   Alcohol use: No    Alcohol/week: 0.0 standard drinks   Drug use: No   Sexual activity: Never  Other Topics Concern   Not on file  Social History Narrative   Lives alone   Social Determinants of Health   Financial Resource Strain: Not on file  Food Insecurity: Not on file  Transportation Needs: Not on file  Physical Activity: Not on file  Stress: Not on file  Social Connections: Not on file  Intimate Partner Violence: Not on file    Family History  Problem Relation Age of Onset   Heart attack Mother    Heart disease Mother    Parkinson's disease Father    Heart attack Maternal Grandfather    Heart disease Maternal Grandfather    Heart attack Maternal Aunt    Heart attack Maternal Grandmother    Prostate cancer Neg Hx    Bladder Cancer Neg Hx    Kidney cancer Neg Hx    Allergies  Allergen Reactions   Cyclobenzaprine Other (See Comments)    Not improving pain and makes him agressive.   Lisinopril Cough   I? Current Outpatient Medications  Medication Sig Dispense Refill   acetaminophen (TYLENOL) 325 MG tablet Take 1 tablet (325 mg total) by mouth every 6 (six) hours as needed for  mild pain, fever or headache (or Fever >/= 101). (Patient not taking: No sig reported)     amLODipine (NORVASC) 10 MG tablet Take 1 tablet (10 mg total) by mouth daily.     amoxicillin-clavulanate (AUGMENTIN) 875-125 MG tablet Take 1 tablet by mouth every 12 (twelve) hours. For 2 weeks     apixaban (ELIQUIS) 5 MG TABS tablet Take 1 tablet (5 mg total) by mouth 2 (two) times daily. 60 tablet    metoprolol tartrate (LOPRESSOR) 25 MG tablet Take 1 tablet (25 mg total) by mouth 2 (two) times daily.     OZEMPIC, 0.25 OR 0.5 MG/DOSE, 2 MG/1.5ML SOPN Inject 1 mg into the skin once a week. (Patient taking differently: Inject 1 mg into the skin every Thursday.) 1.5 mL 0   pravastatin (PRAVACHOL) 20 MG tablet Take 1 tablet (20 mg total) by mouth daily. 90 tablet 1   No current facility-administered medications for this visit.     Abtx:  Anti-infectives (From admission, onward)    None       REVIEW OF SYSTEMS:  Const: negative fever, negative chills,  Eyes: negative diplopia or visual changes, negative eye pain ENT: negative coryza, negative sore throat Resp: negative cough, hemoptysis, dyspnea Cards: negative for chest pain, palpitations, lower extremity edema GU: negative for frequency, dysuria and hematuria GI: Negative for abdominal pain, diarrhea, bleeding, constipation Skin: negative for rash and pruritus Heme: negative for easy bruising and gum/nose bleeding MS: He was in rehab for a few days and has gone home now.  He is getting his strength back. Neurolo:negative for headaches, dizziness, vertigo, memory problems  Psych: Anxiety Endocrine: n as above Allergy/Immunology-as above Objective:  VITALS:  BP 130/80, Temp98.6, Pulse 80 and pulse ox 96%  PHYSICAL EXAM:  General: Alert, cooperative, no distress, appears stated age.  Head: Normocephalic, without obvious abnormality, atraumatic. Eyes: Conjunctivae clear, anicteric sclerae. Pupils are equal ENT Nares normal. No drainage or  sinus tenderness. Lips, mucosa, and tongue normal. No Thrush Neck: Supple, symmetrical, no adenopathy, thyroid: non tender no carotid bruit and no JVD. Back: No CVA tenderness. Lungs: Clear to auscultation bilaterally. No Wheezing or Rhonchi. No rales. Heart: Regular rate and rhythm, no murmur, rub or gallop. Abdomen: Soft, non-tender,not distended. Bowel sounds normal. No masses, rt upper quadrant drain- has milky brown fluid- 15 cc Extremities: atraumatic, no cyanosis. No edema. No clubbing Skin: No rashes or lesions. Or bruising Lymph: Cervical, supraclavicular normal. Neurologic: Grossly non-focal Pertinent Labs Lab Results CBC    Component Value Date/Time   WBC 5.7 10/15/2020 0543   RBC 3.47 (L) 10/15/2020 0543   HGB 9.5 (L) 10/15/2020 0543   HCT 29.1 (L) 10/15/2020 0543   PLT 215 10/15/2020 0543   MCV 83.9 10/15/2020 0543   MCH 27.4 10/15/2020 0543   MCHC 32.6 10/15/2020 0543   RDW 18.7 (H) 10/15/2020 0543   LYMPHSABS 1.8 10/06/2020 0419   MONOABS 1.1 (H) 10/06/2020 0419   EOSABS 0.3 10/06/2020 0419   BASOSABS 0.1 10/06/2020 0419    CMP Latest Ref Rng & Units 10/15/2020 10/14/2020 10/13/2020  Glucose 70 - 99 mg/dL 99 101(H) 94  BUN 8 - 23 mg/dL 20 22 24(H)  Creatinine 0.61 - 1.24 mg/dL 1.85(H) 1.82(H) 1.82(H)  Sodium 135 - 145 mmol/L 141 143 142  Potassium 3.5 - 5.1 mmol/L 3.4(L) 3.3(L) 3.5  Chloride 98 - 111 mmol/L 110 109 110  CO2 22 - 32 mmol/L 23 23 21(L)  Calcium 8.9 - 10.3 mg/dL 8.5(L) 8.5(L) 8.3(L)  Total Protein 6.5 - 8.1 g/dL - - -  Total Bilirubin 0.3 - 1.2 mg/dL - - -  Alkaline Phos 38 - 126 U/L - - -  AST 15 - 41 U/L - - -  ALT 0 - 44 U/L - - -     ? Impression/Recommendation ? Gallbladder fossa abscess.  Has a JP drain on the right upper quadrant. CT scan was done yesterday and it showed near complete resolution of the abscess.  There was some small ill-defined fluid and gas collection and edema and inflammation and hence the radiologist have  decided to leave the drainage catheter for couple of weeks. on Augmentin currently.  Will prescribe another week or 2 as the drain is going to be in place. Today we will do CBC and CMP He needs to follow-up with surgery.  Enterococcus bacteremia secondary to gallbladder fossa abscess which also had Bacteroides.  He has been adequately treated with IV antibiotics followed by  p.o.  Complicated cholecystitis needing percutaneous cholecystomy and followed by laparoscopic cholecystectomy leading to gallbladder fossa abscess as above  Acute kidney injury has resolved.   Follow-up as needed after being evaluated by surgeon ___________________________________________________ Discussed with patient, and his sister in great detail.  Note:  This document was prepared using Dragon voice recognition software and may include unintentional dictation errors.

## 2020-10-30 NOTE — Telephone Encounter (Signed)
Called patient to make sure he got lab results via voicemail, no answer.   Beryle Flock, RN

## 2020-10-30 NOTE — Patient Instructions (Addendum)
Today you are here for gall bladder fossa abscess, enterococcus bacteremia and sepsis- you have been on antibiotics since 5/12. Drain in place since 5/12- had CT scan yesterday and the abscess has almost resolved- please follow up with surgery to get their opinion. Continue Augmentin for a week- today we will do some labs

## 2020-10-30 NOTE — Telephone Encounter (Signed)
-----   Message from Lazarus Gowda, [place] sent at 10/30/2020  1:53 PM EDT ----- Regarding: Richard Oconnell patient PLease verify patient got my vm regarding labs.  ----- Message ----- From: Tsosie Billing, MD Sent: 10/30/2020   1:45 PM EDT To: Lazarus Gowda, CMA  Can you share the results with him- tell him Kidney doing very  well- his white count just went up a tad bit but nothing to be concerned- continue antibiotics and will see what surgery has to say. thx ----- Message ----- From: Buel Ream, Lab In [place] Sent: 10/30/2020  11:42 AM EDT To: Tsosie Billing, MD

## 2020-10-31 ENCOUNTER — Encounter: Payer: Self-pay | Admitting: Family Medicine

## 2020-10-31 ENCOUNTER — Other Ambulatory Visit: Payer: Self-pay

## 2020-10-31 ENCOUNTER — Other Ambulatory Visit: Payer: Medicare Other

## 2020-10-31 ENCOUNTER — Ambulatory Visit (INDEPENDENT_AMBULATORY_CARE_PROVIDER_SITE_OTHER): Payer: Medicare Other | Admitting: Family Medicine

## 2020-10-31 VITALS — BP 150/93 | HR 78 | Ht 76.0 in | Wt 220.0 lb

## 2020-10-31 DIAGNOSIS — N179 Acute kidney failure, unspecified: Secondary | ICD-10-CM | POA: Diagnosis not present

## 2020-10-31 DIAGNOSIS — Z9049 Acquired absence of other specified parts of digestive tract: Secondary | ICD-10-CM

## 2020-10-31 DIAGNOSIS — I4891 Unspecified atrial fibrillation: Secondary | ICD-10-CM | POA: Diagnosis not present

## 2020-10-31 DIAGNOSIS — T8143XA Infection following a procedure, organ and space surgical site, initial encounter: Secondary | ICD-10-CM

## 2020-10-31 NOTE — Telephone Encounter (Signed)
Spoke with patient, he confirms he received voicemail regarding lab results.   Beryle Flock, RN

## 2020-10-31 NOTE — Progress Notes (Signed)
Subjective:    [name], male    DOB: [birth year], 65 y.o.   MRN: [id number]  Richard Oconnell is a 65 y.o. male presenting on 10/31/2020 for Hospitalization Follow-up  Here with sister today for additional history.  HPI  HOSPITAL FOLLOW-UP VISIT  Hospital/Location: [facility] Date of Admission: 09/25/20 Date of Discharge: 10/15/20 to SNF Transitions of care telephone call: Completed 10/15/20 Pricilla Riffle LCSW  Reason for Admission: Altered mental status, Sepsis Acute cholecystitis  PMH - AFib with RVR, Acute metabolic toxic encephalopathy, sepsis with AKI  FOLLOW-UP  - Hospital H&P and Discharge Summary have been reviewed - Patient presents today about 16 days after recent hospitalization. Brief summary of recent course, patient had symptoms of altered mental status and acute severe illness following sepsis.  See complex hospitalization, ICU, had AKI with transient hemodialysis, intubated, physical therapy, multiple specialists.  - Today reports overall has done well after discharge. Symptoms of mental confusion altered status have resolved. Abdominal pain nausea vomiting have resolved.  - New medications on discharge: Metoprolol, Eliquis, Augmentin antibiotic - Changes to current meds on discharge: Held Losartan.  ID / Surgery updates - had CT see results below - Persistent fluid collection, gas. Leave drain for 2 weeks was advice, now he saw ID yesterday 10/30/20. Was concern CT image and advised may not wait 2 weeks and he will return to Surgery Monday. He has had more watery discharge from the JP drain currently.  History of Atrial Fibrillation RVR - He was placed newly on BB med metoprolol, anticoagulation. He has had maybe transient AFib in past but not sustained problem. He needs to see Cardiologist soon to follow up on this may consider other treatments if still in AFib.  Asking if okay to drive.  Labs were done yesterday 10/30/20 see below  I have  reviewed the discharge medication list, and have reconciled the current and discharge medications today.    Depression screen [facility] 2/9 10/31/2020 05/30/2020 03/09/2019  Decreased Interest 0 0 0  Down, Depressed, Hopeless 0 0 0  PHQ - 2 Score 0 0 0  Altered sleeping 0 - -  Tired, decreased energy 0 - -  Change in appetite 0 - -  Feeling bad or failure about yourself  0 - -  Trouble concentrating 0 - -  Moving slowly or fidgety/restless 0 - -  Suicidal thoughts 0 - -  PHQ-9 Score 0 - -  Difficult doing work/chores Not difficult at all - -    Social History   Tobacco Use   Smoking status: Never   Smokeless tobacco: Never   Tobacco comments:    once a while had one cigar >10 years ago  Vaping Use   Vaping Use: Never used  Substance Use Topics   Alcohol use: No    Alcohol/week: 0.0 standard drinks   Drug use: No    Review of Systems Per HPI unless specifically indicated above     Objective:    BP (!) 150/93   Pulse 78   Ht 6\' 4"  (1.93 m)   Wt 220 lb (99.8 kg)   SpO2 100%   BMI 26.78 kg/m   Wt Readings from Last 3 Encounters:  10/31/20 220 lb (99.8 kg)  10/15/20 223 lb 12.8 oz (101.5 kg)  09/16/20 250 lb 3.2 oz (113.5 kg)    Physical Exam Vitals and nursing note reviewed.  Constitutional:      General: He is not in acute distress.  Appearance: He is well-developed. He is not diaphoretic.     Comments: Well-appearing, comfortable, cooperative  HENT:     Head: Normocephalic and atraumatic.  Eyes:     General:        Right eye: No discharge.        Left eye: No discharge.     Conjunctiva/sclera: Conjunctivae normal.  Neck:     Thyroid: No thyromegaly.  Cardiovascular:     Rate and Rhythm: Normal rate and regular rhythm.     Pulses: Normal pulses.     Heart sounds: Normal heart sounds. No murmur heard.    Comments: No ectopy on exam today Pulmonary:     Effort: Pulmonary effort is normal.  Musculoskeletal:        General: Normal range of motion.      Cervical back: Normal range of motion and neck supple.  Lymphadenopathy:     Cervical: No cervical adenopathy.  Skin:    General: Skin is warm and dry.     Findings: No erythema or rash.  Neurological:     Mental Status: He is alert and oriented to person, place, and time. Mental status is at baseline.  Psychiatric:        Behavior: Behavior normal.     Comments: Well groomed, good eye contact, normal speech and thoughts    I have personally reviewed the radiology report from 10/29/20 CT.  CLINICAL DATA:  65 year old male with a history of gallbladder fossa abscess status post cholecystectomy, prior drainage   EXAM: CT ABDOMEN AND PELVIS WITHOUT CONTRAST   TECHNIQUE: Multidetector CT imaging of the abdomen and pelvis was performed following the standard protocol without IV contrast.   COMPARISON:  10/13/2020   FINDINGS: Lower chest: No acute finding of the lower chest   Hepatobiliary: Majority of liver parenchyma unremarkable. There is a rim of hypo density at the gallbladder fossa, adjacent to the indwelling percutaneous pigtail drainage catheter terminating within the gallbladder fossa. Overall there is decreasing fluid and gas collection at the pigtail drainage catheter, with some persisting ill-defined fluid/tissue near the drain. Improved inflammatory changes, though some edema persists within the adjacent fat extending caudally. Surgical changes of cholecystectomy.   Pancreas: Unremarkable   Spleen: Unremarkable   Adrenals/Urinary Tract:   - Right adrenal gland:  Unremarkable   - Left adrenal gland: Unremarkable.   - Right kidney: No hydronephrosis, nephrolithiasis, inflammation, or ureteral dilation. No focal lesion.   - Left Kidney: No hydronephrosis, nephrolithiasis, inflammation, or ureteral dilation. Redemonstration of low-density cyst of the left kidney, proximally 2 cm.   - Urinary Bladder: Urinary bladder relatively decompressed. No inflammatory  changes.   Stomach/Bowel:   - Stomach: Unremarkable.   - Small bowel: Unremarkable   - Appendix: Normal.   - Colon: Moderate stool burden. Colonic diverticular disease without acute inflammatory changes. No evidence of obstruction.   Vascular/Lymphatic: Mild calcifications of the abdominal aorta. No adenopathy.   Reproductive: Unremarkable prostate   Other: None   Musculoskeletal: Degenerative changes of the spine. No significant bony canal narrowing. Vacuum disc phenomenon spanning L2-S1. Additional sites of the lower thoracic spine. Redemonstration of sclerotic focus in the lateral left eighth rib. No aggressive lytic lesions identified.   IMPRESSION: Unchanged pigtail drainage catheter within the gallbladder fossa with near complete resolution of the abscess/fluid. Given the persistent small ill-defined fluid and gas collection and edema/inflammation, we elected to maintain the drainage catheter and forego removing the drain at this time.   Additional ancillary  findings as above.   Signed,   Dulcy Fanny. Dellia Nims, RPVI   [facility]   [facility]     Electronically Signed   By: Corrie Mckusick D.O.   On: 10/29/2020 17:31   Results for orders placed or performed during the hospital encounter of 10/30/20  Comprehensive metabolic panel  Result Value Ref Range   Sodium 138 135 - 145 mmol/L   Potassium 4.6 3.5 - 5.1 mmol/L   Chloride 106 98 - 111 mmol/L   CO2 23 22 - 32 mmol/L   Glucose, Bld 126 (H) 70 - 99 mg/dL   BUN 34 (H) 8 - 23 mg/dL   Creatinine, Ser 1.32 (H) 0.61 - 1.24 mg/dL   Calcium 9.4 8.9 - 10.3 mg/dL   Total Protein 8.3 (H) 6.5 - 8.1 g/dL   Albumin 4.0 3.5 - 5.0 g/dL   AST 25 15 - 41 U/L   ALT 28 0 - 44 U/L   Alkaline Phosphatase 103 38 - 126 U/L   Total Bilirubin 0.9 0.3 - 1.2 mg/dL   GFR, Estimated 60 (L) >60 mL/min   Anion gap 9 5 - 15  CBC with Differential/Platelet  Result Value Ref Range    WBC 10.8 (H) 4.0 - 10.5 K/uL   RBC 4.21 (L) 4.22 - 5.81 MIL/uL   Hemoglobin 11.8 (L) 13.0 - 17.0 g/dL   HCT 36.1 (L) 39.0 - 52.0 %   MCV 85.7 80.0 - 100.0 fL   MCH 28.0 26.0 - 34.0 pg   MCHC 32.7 30.0 - 36.0 g/dL   RDW 18.7 (H) 11.5 - 15.5 %   Platelets 342 150 - 400 K/uL   nRBC 0.0 0.0 - 0.2 %   Neutrophils Relative % 67 %   Neutro Abs 7.1 1.7 - 7.7 K/uL   Lymphocytes Relative 24 %   Lymphs Abs 2.6 0.7 - 4.0 K/uL   Monocytes Relative 6 %   Monocytes Absolute 0.7 0.1 - 1.0 K/uL   Eosinophils Relative 2 %   Eosinophils Absolute 0.2 0.0 - 0.5 K/uL   Basophils Relative 1 %   Basophils Absolute 0.1 0.0 - 0.1 K/uL   Immature Granulocytes 0 %   Abs Immature Granulocytes 0.03 0.00 - 0.07 K/uL      Assessment & Plan:   Problem List Items Addressed This Visit     S/P laparoscopic cholecystectomy   Other Visit Diagnoses     Intra-abdominal abscess post-procedure    -  Primary   Atrial fibrillation with RVR ([place])       Relevant Orders   Ambulatory referral to Cardiology   AKI (acute kidney injury) ([place])           Complex hospitalization w/ sepsis due to cholecystitis intra-abdominal abscess infection Prolonged course with complications of respiratory failure, AKI renal failure on intermittent hemodialysis - has all RESOLVED  AKI resolved  Has JP drain in place still at site of choley, has some drainage still, has flushed some was told to hold flushes and had CT - reviewed image above.  He will return to Gen Surgery on Monday for further follow up advice on when to remove drain.  ID yesterday extended Augmentin x 2 weeks. He will take yogurt for now, consider probiotics  Kidney function back to baseline - he has apt with Nephrology to monitor this soon, and can discuss restart ARB Valsartan or not, still has high BP  Cardiology - will need referral today to establish  for AFib, likely triggered by acute illness events and with RVR at that time, today appears to be sinus  without tachycardia. On BB improved may adjust meds in future.  On Eliquis - can discuss options depending on results per Cardiology, if need definitive AFib treatment or if only transient etc.  All questions answered in detail, discuss precautions and risks when to return to care sooner, vital sign monitoring.  Future A1c repeat follow up 2-3 months.    Orders Placed This Encounter  Procedures   Ambulatory referral to Cardiology    Referral Priority:   Routine    Referral Type:   Consultation    Referral Reason:   Specialty Services Required    Requested Specialty:   Cardiology    Number of Visits Requested:   1     No orders of the defined types were placed in this encounter.   Follow up plan: Return in about 3 months (around 01/31/2021) for 3 month follow-up DM A1c, HTN.   Nobie Putnam, [facility] 10/31/2020, 10:58 AM

## 2020-10-31 NOTE — Patient Instructions (Addendum)
Thank you for coming to the office today.  Cardiology referral today for the AFib  [facility]) [facility] [address] [place], [place] [address] Main: [phone]   They will discuss the metoprolol and the eliquis and figure out options for future with monitoring or treating AFib if it still persists, or if can come off medicines in future.  May flush the drain again before Monday, follow up surgery.  You can drive if feel comfortable. It sounds like you are ready to return.  Please discuss hypertension blood pressure with kidney specialist further may need to return to the Valsartan medication again in future.   Please schedule a Follow-up Appointment to: Return in about 3 months (around 01/31/2021) for 3 month follow-up DM A1c, HTN.  If you have any other questions or concerns, please feel free to call the office or send a message through [facility]. You may also schedule an earlier appointment if necessary.  Additionally, you may be receiving a survey about your experience at our office within a few days to 1 week by e-mail or mail. We value your feedback.  Nobie Putnam, DO [place]

## 2020-11-03 ENCOUNTER — Other Ambulatory Visit: Payer: Self-pay

## 2020-11-03 ENCOUNTER — Encounter: Payer: Self-pay | Admitting: Physician Assistant

## 2020-11-03 ENCOUNTER — Ambulatory Visit (INDEPENDENT_AMBULATORY_CARE_PROVIDER_SITE_OTHER): Payer: Medicare Other | Admitting: Physician Assistant

## 2020-11-03 VITALS — BP 146/87 | HR 76 | Temp 98.0°F | Ht 76.0 in | Wt 220.0 lb

## 2020-11-03 DIAGNOSIS — Z09 Encounter for follow-up examination after completed treatment for conditions other than malignant neoplasm: Secondary | ICD-10-CM

## 2020-11-03 DIAGNOSIS — Z9049 Acquired absence of other specified parts of digestive tract: Secondary | ICD-10-CM

## 2020-11-03 DIAGNOSIS — K810 Acute cholecystitis: Secondary | ICD-10-CM

## 2020-11-03 NOTE — Progress Notes (Signed)
[facility] POST-OP OFFICE VISIT  11/03/2020  HPI: Richard Oconnell is a 65 y.o. male well known to our service following initial hospitalization in February of 2022 for severe cholecystitis which was managed with percutaneous cholecystostomy tube. He ultimately underwent laparoscopic cholecystectomy with Dr Celine Ahr 04/25. This was complicated post-operatively by septic shock secondary abscess in the GB fossa s/p percutaneous drainage and prolonged admission including needing intubation. He was discharged home on 05/31. Since discharge he has been followed with interventional radiology for his drain management. He had repeat CT on 06/15 which showed near complete resolution of GB fossa abscess, and he will follow up with them again on 06/29. He has also had course of Augmentin extended with ID.   He has done remarkably well since discharge. He reports he is "97% recovered." He has minimal discomfort from the drain itself but otherwise is without fever, chills, nausea, emesis, or bowel changes. His drain has been putting out <10 ccs daily but remains somewhat seropurulent. He has stopped flushing per IR request. He did have labs drawn with ID on 06/16 which showed near resolution in leukocytosis to 10.8K and renal function continues to improve with sCr - 1.32.    Vital signs: BP (!) 146/87   Pulse 76   Temp 98 F (36.7 C) (Oral)   Ht 6\' 4"  (1.93 m)   Wt 220 lb (99.8 kg)   SpO2 97%   BMI 26.78 kg/m    Physical Exam: Constitutional: Well appearing male, NAD Abdomen: Soft, non-tender, non-distended, no rebound/guarding. Drain in the RUQ with minimal seropurulent drainage Skin: Laparoscopic incisions are well healed   Imaging reviewed: CT Abdomen/Pelvis (10/29/2020) personally reviewed with near resolution in gallbladder fossa abscess, and radiologist report reviewed below:  IMPRESSION: Unchanged pigtail drainage catheter within the gallbladder fossa with near complete  resolution of the abscess/fluid. Given the persistent small ill-defined fluid and gas collection and edema/inflammation, we elected to maintain the drainage catheter and forego removing the drain at this time.   Assessment/Plan: This is a 65 y.o. male with history of cholecystitis s/p percutaneous cholecystostomy and cholecystectomy on 92/44 complicated by septic shock secondary to gallbladder fossa abscess   - I will defer drain removal to IR following his repeat imaging/appointment on 06/29 as they have been managing this outpatient to this point. If drainage remains low and abscess is cleared on CT, I think it is reasonable to remove drain at that time. We will be happy to contribute to decision making process if needed.   - Continue Abx (Augmentin) per ID  - Monitor and record drain output daily  - He can follow up with surgery clinic on an as needed basi; we will be happy to assist in any way possible   -- Edison Simon, PA-C [facility] 11/03/2020, 10:57 AM [phone] M-F: 7am - 4pm

## 2020-11-03 NOTE — Patient Instructions (Addendum)
Finish all of your antibiotics.  Follow up with IR and have your CT done on 11/12/20. If everything looks clear they may pull your drain.  Empty your drain everyday, and write down amount and color/consistency.  No need to flush the line.   Follow-up with our office as needed.  Please call and ask to speak with a nurse if you develop questions or concerns.

## 2020-11-05 ENCOUNTER — Ambulatory Visit (INDEPENDENT_AMBULATORY_CARE_PROVIDER_SITE_OTHER): Payer: Medicare Other | Admitting: Pharmacist

## 2020-11-05 DIAGNOSIS — I480 Paroxysmal atrial fibrillation: Secondary | ICD-10-CM

## 2020-11-05 DIAGNOSIS — I10 Essential (primary) hypertension: Secondary | ICD-10-CM

## 2020-11-05 DIAGNOSIS — E785 Hyperlipidemia, unspecified: Secondary | ICD-10-CM | POA: Diagnosis not present

## 2020-11-05 DIAGNOSIS — E1142 Type 2 diabetes mellitus with diabetic polyneuropathy: Secondary | ICD-10-CM

## 2020-11-05 DIAGNOSIS — E1169 Type 2 diabetes mellitus with other specified complication: Secondary | ICD-10-CM

## 2020-11-05 NOTE — Patient Instructions (Signed)
Visit Information  PATIENT GOALS:  Goals Addressed             This Visit's Progress    Pharmacy Goals       Our goal bad cholesterol, or LDL, is less than 70 . This is why it is important to continue taking your pravastatin  Please check your home blood pressure, keep a log of the results and bring this with you to your medical appointments.  Feel free to call me with any questions or concerns. I look forward to our next call!  Harlow Asa, PharmD, [place] [phone]         The patient verbalized understanding of instructions, educational materials, and care plan provided today and declined offer to receive copy of patient instructions, educational materials, and care plan.   Telephone follow up appointment with care management team member scheduled for: 7/27 at 10 am

## 2020-11-05 NOTE — Chronic Care Management (AMB) (Signed)
Chronic Care Management Pharmacy Note  11/05/2020 Name:  Richard Oconnell MRN:  [id number] DOB:  07-26-1955   Subjective: Richard Oconnell is an 64 y.o. year old male who is a primary patient of [name], DO.  The CCM team was consulted for assistance with disease management and care coordination needs.    Engaged with patient by telephone for follow up visit in response to provider referral for pharmacy case management and/or care coordination services.   Consent to Services:  The patient was given information about Chronic Care Management services, agreed to services, and gave verbal consent prior to initiation of services.  Please see initial visit note for detailed documentation.   Patient Care Team: [name], DO as PCP - General (Family Medicine) Winfield Cunas Virl Diamond, [place] as Pharmacist  Recent office visits: Office Visit with PCP on 6/17 for hospitalization Follow-up  Recent consult visits: Office Visit with [facility] on 6/16 for follow up Office Visit with [facility] on 6/20  Hospital visits: Patient admitted to [facility] from 5/12 to 6/1 for sepsis with acute renal failure and septic shock related to abscess  Objective:  Lab Results  Component Value Date   CREATININE 1.32 (H) 10/30/2020   CREATININE 1.85 (H) 10/15/2020   CREATININE 1.82 (H) 10/14/2020    Lab Results  Component Value Date   HGBA1C 6.7 (H) 09/26/2020   Last diabetic Eye exam: No results found for: HMDIABEYEEXA  Last diabetic Foot exam: No results found for: HMDIABFOOTEX      Component Value Date/Time   CHOL 105 07/21/2020 1108   TRIG 226 (H) 09/28/2020 0418   HDL 33 (L) 07/21/2020 1108   CHOLHDL 3.2 07/21/2020 1108   VLDL 51 (H) 11/24/2016 0818   LDLCALC 45 07/21/2020 1108    Hepatic Function Latest Ref Rng & Units 10/30/2020 10/09/2020 10/08/2020  Total Protein 6.5 - 8.1 g/dL 8.3(H) - -   Albumin 3.5 - 5.0 g/dL 4.0 2.4(L) 2.3(L)  AST 15 - 41 U/L 25 - -  ALT 0 - 44 U/L 28 - -  Alk Phosphatase 38 - 126 U/L 103 - -  Total Bilirubin 0.3 - 1.2 mg/dL 0.9 - -  Bilirubin, Direct 0.0 - 0.2 mg/dL - - -     Social History   Tobacco Use  Smoking Status Never  Smokeless Tobacco Never  Tobacco Comments   once a while had one cigar >10 years ago   BP Readings from Last 3 Encounters:  11/03/20 (!) 146/87  10/31/20 (!) 150/93  10/15/20 135/83   Pulse Readings from Last 3 Encounters:  11/03/20 76  10/31/20 78  10/15/20 64   Wt Readings from Last 3 Encounters:  11/03/20 220 lb (99.8 kg)  10/31/20 220 lb (99.8 kg)  10/15/20 223 lb 12.8 oz (101.5 kg)    Assessment: Review of patient past medical history, allergies, medications, health status, including review of consultants reports, laboratory and other test data, was performed as part of comprehensive evaluation and provision of chronic care management services.   SDOH:  (Social Determinants of Health) assessments and interventions performed: none   CCM Care Plan  Allergies  Allergen Reactions   Cyclobenzaprine Other (See Comments)    Not improving pain and makes him agressive.   Lisinopril Cough    Medications Reviewed Today     Reviewed by Vella Raring, RPH-CPP (Pharmacist) on 11/05/20 at [phone]  Med List Status: <None>  Medication Order Taking? Sig Documenting Provider Last Dose Status Informant  amLODipine (NORVASC) 10 MG tablet [id number] Yes Take 1 tablet (10 mg total) by mouth daily. Kathie Dike, MD Taking Active   amoxicillin-clavulanate (AUGMENTIN) 875-125 MG tablet [id number] Yes Take 1 tablet by mouth 2 (two) times daily. Tsosie Billing, MD Taking Active   apixaban (ELIQUIS) 5 MG TABS tablet [id number] Yes Take 1 tablet (5 mg total) by mouth 2 (two) times daily. Kathie Dike, MD Taking Active   metoprolol tartrate (LOPRESSOR) 25 MG tablet [id number] Yes Take 1 tablet (25 mg total) by  mouth 2 (two) times daily. Kathie Dike, MD Taking Active   OZEMPIC, 0.25 OR 0.5 MG/DOSE, 2 MG/1.5ML SOPN [id number] Yes Inject 1 mg into the skin once a week.  Patient taking differently: Inject 1 mg into the skin every Thursday.   [name], DO Taking Active            Med Note Kellie Simmering, Jenene Slicker   Fri Aug 15, 2020  2:42 PM)    pravastatin (PRAVACHOL) 20 MG tablet [id number] Yes Take 1 tablet (20 mg total) by mouth daily. [name], DO Taking Active Self            Patient Active Problem List   Diagnosis Date Noted   Pressure injury of skin 10/13/2020   Protein-calorie malnutrition, severe 10/10/2020   Postprocedural intraabdominal abscess    Acute respiratory failure ([place]) 09/28/2020   Encounter for nasogastric (NG) tube placement    Altered mental status    Abscess    Acute kidney failure, unspecified ([place])    Sepsis with acute renal failure and septic shock ([place]) 09/25/2020   S/P laparoscopic cholecystectomy 69/67/8938   Metabolic syndrome 03/02/5101   Atrial fibrillation ([place]) 07/09/2020   Class 1 obesity due to excess calories with serious comorbidity and body mass index (BMI) of 33.0 to 33.9 in adult 07/06/2019   Malignant neoplasm of urinary bladder ([place]) 12/06/2018   Overweight (BMI 25.0-29.9) 07/04/2018   Hyperlipidemia associated with type 2 diabetes mellitus ([place]) 11/30/2016   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   Gross hematuria 11/16/2016   Erectile dysfunction associated with type 2 diabetes mellitus ([place]) 05/26/2016   Diabetes mellitus with coincident hypertension ([place]) 06/11/2015   Type 2 diabetes mellitus with diabetic polyneuropathy, without long-term current use of insulin ([place]) 06/11/2015    Immunization History  Administered Date(s) Administered   Moderna Sars-Covid-2 Vaccination 11/30/2019, 12/28/2019   Pneumococcal Conjugate-13 05/30/2020    Conditions to be addressed/monitored: T2DM, HLD, atrial fibrillation  Care Plan  : PharmD - Medication Assistance  Updates made by Vella Raring, RPH-CPP since 11/05/2020 12:00 AM     Problem: Disease Progression      Long-Range Goal: Disease Progression Prevented or Minimized   Start Date: 06/06/2020  Expected End Date: 09/04/2020  This Visit's Progress: On track  Recent Progress: On track  Priority: High  Note:   Current Barriers:  Difficulty with independently affording treatment regimen. Reports cost of Ozempic is difficult to afford, particularly when enters coverage gap of [name] Patient APPROVED for patient assistance from [place] through 05/16/2021  Pharmacist Clinical Goal(s):  Over the next 90 days, patient will verbalize ability to afford treatment regimen through collaboration with PharmD and provider.   Interventions: 1:1 collaboration with [name], DO regarding development and update of comprehensive plan of care as evidenced by provider attestation and co-signature Inter-disciplinary care team collaboration (see longitudinal plan of care) Perform  chart review Patient seen for Office Visit with [facility] on 6/16 for follow up. Provider advised: CT scan was done on 6/15 showed near complete resolution of the abscess Patient to continue Augmentin Rx for 1 week  Office Visit with PCP on 6/17 for hospitalization Follow-up Patient referred to Cardiology for evaluation of atrial fibrillation Appointment with Cardiologist has been scheduled for 7/25 [facility] with [facility]. Provider advised patient to: Follow up with IR to have CT done on 6/29 Empty his drain everyday, and write down amount and color/consistency Comprehensive medication review performed; medication list updated in electronic medical record Confirms continuing Augmentin twice daily as directed and has ~2 more days of course remaining Confirms has been keeping log on output from his drain as  directed Reports doing well with Home Health Physical Therapy  Type 2 Diabetes: Current treatment: Ozempic 61m weekly Reports last checked morning fasting blood sugar on 6/17: 123   Hypertension/ Atrial Fibrillation Current treatment: Eliquis 5 mg twice daily Metoprolol 25 mg twice daily Amlodipine 10 mg daily Reports recent home BP/HR readings: 6/22: 114/74, HR 107 *Reports active prior to these readings 6/21: 142/73, HR 100 * 6/20: 119/78, HR 84 Counsel on BP monitoring technique, including resting prior to readings Encourage patient to continue to monitor home BP/HR, keep log of results and bring record with him to upcoming appointment with Cardiology   Hyperlipidemia:  Controlled; current treatment: pravastatin 20 mg daily  Medication Adherence: Reports currently taking medications from pill bottles Encourage patient to start using weekly pillbox as adherence tool  Medication Assistance: Counsel patient on Eliquis patient assistance program, including income and annual out of pocket expense requirements Note patient has previously advised that he has applied for Extra Help subsidy, but does not qualify based on assets Patient reports cost of Eliquis is affordable for now, but may contact CM Pharmacist when close to/has met out of pocket requirement for 2022 calendar year Note patient currently purchasing diabetes testing supplies OTC. Counsel on option for receiving these supplies through health plan with Rx for cost savings. Patient declines as okay with cost for now, but will let CM Pharmacist know if interested in the future  Patient Goals/Self-Care Activities Over the next 90 days, patient will:  - check glucose, document, and provide at future appointments - check blood pressure, document, and provide at future appointments - collaborate with provider on medication access solutions - attend medical appointments as scheduled   Appointment with Radiology on  6/29   Appointment with Cardiology on 7/25     Follow Up Plan:  Telephone follow up appointment with care management team member scheduled for: 7/27 at 10 am     Medication Assistance: Patient APPROVED for patient assistance from [employer] 05/16/2021  Patient's preferred pharmacy is:  [place] [place]Phone: [phone]: [address] [address] [address]Phone: [phone] Fax: [phone] [place] [address]Phone: [phone]: [phone] Follow Up:  Patient agrees to Care Plan and Follow-up.  EHarlow Asa PharmD, BPara March CPP Clinical Pharmacist [phone]

## 2020-11-07 ENCOUNTER — Other Ambulatory Visit: Payer: Self-pay | Admitting: Family Medicine

## 2020-11-07 ENCOUNTER — Ambulatory Visit: Payer: Self-pay

## 2020-11-07 ENCOUNTER — Telehealth: Payer: Self-pay

## 2020-11-07 DIAGNOSIS — T8143XA Infection following a procedure, organ and space surgical site, initial encounter: Secondary | ICD-10-CM

## 2020-11-07 MED ORDER — AMOXICILLIN-POT CLAVULANATE 875-125 MG PO TABS: 1.0000 | ORAL_TABLET | Freq: Two times a day (BID) | ORAL | 0 refills | Status: DC

## 2020-11-07 NOTE — Telephone Encounter (Signed)
Reviewed chart, I last saw him 10/31/20 for this.  He has seen Edison Simon PA Gen Surgery on 6/20  Ultimately decision was for [facility] IR to make final decision on drain removal anticipated next week 11/12/20 with repeat imaging prior to removal.  He still had some purulence or thicker drainage from the drain.  He was continued on Augmentin, but patient reports he just ran out of this yesterday.  I spoke with Alroy Dust on Epic secure chat message today about this to get his opinion and recommendations, and overall we both agree that he could continue antibiotics, and as long as no fever, or severe worsening pain or new symptoms that he should proceed w/ apt on 6/29 with [facility] IR for imaging and they can determine drain removal plan.  The pain he is experiencing could be from the drain itself physically bothering him.  I have called the patient and discussed this plan he is In agreement. Today pain is somewhat better  I will go ahead and extend his Augmentin today 7 more days.  Regarding IR radiology, will check if our office can reach them to see if they can contact patient or [place] back with recommendations, and if they believe he needs a sooner apt perhaps they can re-schedule or work him in sooner than 6/29.  He was advised to seek care sooner at ED if severe worsening or new concerns.  Nobie Putnam, [facility] 11/07/2020, 12:08 PM

## 2020-11-07 NOTE — Telephone Encounter (Signed)
Pt is calling to ask advice about his bile drain. He has an appt on 06/29 with [facility]. Wanting to know if there is concern and should the appt be moved up?        Reason for Disposition  [1] MILD-MODERATE pain AND [2] constant AND [3] present > 2 hours  Answer Assessment - Initial Assessment Questions 1. LOCATION: "Where does it hurt?"      Right side at drain site 2. RADIATION: "Does the pain shoot anywhere else?" (e.g., chest, back)     No 3. ONSET: "When did the pain begin?" (Minutes, hours or days ago)      2 days ago 4. SUDDEN: "Gradual or sudden onset?"     Sudden 5. PATTERN "Does the pain come and go, or is it constant?"    - If constant: "Is it getting better, staying the same, or worsening?"      (Note: Constant means the pain never goes away completely; most serious pain is constant and it progresses)     - If intermittent: "How long does it last?" "Do you have pain now?"     (Note: Intermittent means the pain goes away completely between bouts)     Constant 6. SEVERITY: "How bad is the pain?"  (e.g., Scale 1-10; mild, moderate, or severe)    - MILD (1-3): doesn't interfere with normal activities, abdomen soft and not tender to touch     - MODERATE (4-7): interferes with normal activities or awakens from sleep, abdomen tender to touch     - SEVERE (8-10): excruciating pain, doubled over, unable to do any normal activities       2-3 7. RECURRENT SYMPTOM: "Have you ever had this type of stomach pain before?" If Yes, ask: "When was the last time?" and "What happened that time?"      No 8. CAUSE: "What do you think is causing the stomach pain?"     Unsure 9. RELIEVING/AGGRAVATING FACTORS: "What makes it better or worse?" (e.g., movement, antacids, bowel movement)     No 10. OTHER SYMPTOMS: "Do you have any other symptoms?" (e.g., back pain, diarrhea, fever, urination pain, vomiting)       No  Protocols used: Abdominal Pain - Male-A-AH

## 2020-11-07 NOTE — Telephone Encounter (Signed)
Pt. Reports he saw his surgeon Monday in regard to drain in place to right abdomen. 2 nights ago started having pain at insertion site. States "I thought maybe I pulled it or something." Pain has lessened, but is constant. 2-3/10. Concerned because "I've been so sick with this in the past." Wants Dr. Parks Ranger opinion. No availability in the practice today. Spoke with Apolonio Schneiders in the practice. Will send triage for review. Advised pt. To call surgical office as well.

## 2020-11-07 NOTE — Telephone Encounter (Signed)
Pt called concerned that he might have pulled his drain out. Considering, pt was septic in the past, he was worried about it happening again.  I spoke with Thedore Mins and he advised pt he should be ok until Wednesday (CT scan scheduled) and as long as he's not running a fever, he has no worries.  Notified pt of the recommendations. Pt was appreciative of Thedore Mins and verbalizes understanding.

## 2020-11-12 ENCOUNTER — Encounter: Payer: Self-pay | Admitting: *Deleted

## 2020-11-12 ENCOUNTER — Ambulatory Visit
Admission: RE | Admit: 2020-11-12 | Discharge: 2020-11-12 | Disposition: A | Discharge status: Home / Self Care | Payer: Medicare Other | Source: Ambulatory Visit | Attending: Infectious Diseases | Admitting: Infectious Diseases

## 2020-11-12 DIAGNOSIS — N3289 Other specified disorders of bladder: Secondary | ICD-10-CM | POA: Diagnosis not present

## 2020-11-12 DIAGNOSIS — K651 Peritoneal abscess: Secondary | ICD-10-CM

## 2020-11-12 DIAGNOSIS — K6811 Postprocedural retroperitoneal abscess: Secondary | ICD-10-CM | POA: Diagnosis not present

## 2020-11-12 DIAGNOSIS — L0291 Cutaneous abscess, unspecified: Secondary | ICD-10-CM

## 2020-11-12 DIAGNOSIS — Z978 Presence of other specified devices: Secondary | ICD-10-CM | POA: Diagnosis not present

## 2020-11-12 DIAGNOSIS — K810 Acute cholecystitis: Secondary | ICD-10-CM | POA: Diagnosis not present

## 2020-11-12 HISTORY — PX: IR RADIOLOGIST EVAL & MGMT: IMG5224

## 2020-11-12 NOTE — Progress Notes (Signed)
Chief Complaint: Patient was seen in consultation today for gallbladder fossa abscess at the request of [place]  Referring Physician(s): Ravishankar,Jayashree  History of Present Illness: Richard Oconnell is a 65 y.o. male with a history of acute cholecystitis diagnosed on 07/10/20.  Patient underwent percutaneous cholecystostomy tube placement as he was deemed high risk for surgery due to new onset a fib and hypotension.  At subsequent drain evaluation 3/24, his cystic duct remained obstructed, his drain remained in place, and he was ultimately able to undergo cholecystectomy 09/08/20 with Dr. Celine Ahr. He returned to the ED 09/25/20 with confusion and concern for sepsis. CT imaging showed a large abscess in the gall bladder fossa.  He underwent gall bladder fossa drain placement 09/25/20 which was upsized to a 14 Fr drain 10/02/20.    CT imaging today demonstrates resolution of the gallbladder fossa abscess.   Mr. Brandy reports he is feeling better.  His drain remains in place without issue.  He has had no output for the past week.  He is still taking antibiotics.  Past Medical History:  Diagnosis Date   Cancer [facility])    Bladder Cancer   Erectile dysfunction associated with type 2 diabetes mellitus ([facility]) 05/26/2016   Gross hematuria 11/16/2016   Hypertension 06/11/2015   Type 2 diabetes mellitus with diabetic polyneuropathy, without long-term current use of insulin ([place]) 06/11/2015    Past Surgical History:  Procedure Laterality Date   boil lanceted     hand   COLONOSCOPY WITH PROPOFOL N/A 07/03/2020   Procedure: COLONOSCOPY WITH PROPOFOL;  Surgeon: Jonathon Bellows, MD;  Location: [facility] ENDOSCOPY;  Service: Gastroenterology;  Laterality: N/A;   CYSTOSCOPY W/ RETROGRADES Bilateral 12/06/2016   Procedure: CYSTOSCOPY WITH RETROGRADE PYELOGRAM;  Surgeon: Hollice Espy, MD;  Location: [facility] ORS;  Service: Urology;  Laterality: Bilateral;   CYSTOSCOPY W/ RETROGRADES Bilateral  06/27/2017   Procedure: CYSTOSCOPY WITH RETROGRADE PYELOGRAM;  Surgeon: Hollice Espy, MD;  Location: [facility] ORS;  Service: Urology;  Laterality: Bilateral;   CYSTOSCOPY WITH BIOPSY N/A 06/27/2017   Procedure: CYSTOSCOPY WITH Bladder BIOPSY;  Surgeon: Hollice Espy, MD;  Location: [facility] ORS;  Service: Urology;  Laterality: N/A;   CYSTOSCOPY WITH BIOPSY N/A 10/24/2017   Procedure: CYSTOSCOPY WITH Bladder BIOPSY;  Surgeon: Hollice Espy, MD;  Location: [facility] ORS;  Service: Urology;  Laterality: N/A;   ingrown  Bilateral    ingrown toenail   IR BILIARY DRAIN PLACEMENT WITH CHOLANGIOGRAM  07/10/2020   IR CHOLANGIOGRAM EXISTING TUBE  08/07/2020   IR RADIOLOGIST EVAL & MGMT  08/07/2020   IR RADIOLOGIST EVAL & MGMT  10/29/2020   IR RADIOLOGIST EVAL & MGMT  11/12/2020   TRANSURETHRAL RESECTION OF BLADDER TUMOR N/A 12/06/2016   Procedure: TRANSURETHRAL RESECTION OF BLADDER TUMOR (TURBT) (2-5cm) CLOT EVACUATION;  Surgeon: Hollice Espy, MD;  Location: [facility] ORS;  Service: Urology;  Laterality: N/A;   TRANSURETHRAL RESECTION OF BLADDER TUMOR N/A 06/27/2017   Procedure: TRANSURETHRAL RESECTION OF BLADDER TUMOR (TURBT);  Surgeon: Hollice Espy, MD;  Location: [facility] ORS;  Service: Urology;  Laterality: N/A;   TRANSURETHRAL RESECTION OF BLADDER TUMOR WITH MITOMYCIN-C N/A 01/18/2017   Procedure: TRANSURETHRAL RESECTION OF BLADDER TUMOR WITH MITOMYCIN-C-(SMALL);  Surgeon: Hollice Espy, MD;  Location: [facility] ORS;  Service: Urology;  Laterality: N/A;    Allergies: Cyclobenzaprine and Lisinopril  Medications: Prior to Admission medications   Medication Sig Start Date End Date Taking? Authorizing Provider  amLODipine (NORVASC) 10 MG tablet Take 1 tablet (10 mg total) by mouth daily.  10/15/20   Kathie Dike, MD  amoxicillin-clavulanate (AUGMENTIN) 875-125 MG tablet Take 1 tablet by mouth 2 (two) times daily. Extended for 7 more days 11/07/20   [name], DO  apixaban (ELIQUIS) 5 MG TABS tablet Take 1  tablet (5 mg total) by mouth 2 (two) times daily. 10/14/20   Kathie Dike, MD  metoprolol tartrate (LOPRESSOR) 25 MG tablet Take 1 tablet (25 mg total) by mouth 2 (two) times daily. 10/15/20   Kathie Dike, MD  OZEMPIC, 0.25 OR 0.5 MG/DOSE, 2 MG/1.5ML SOPN Inject 1 mg into the skin once a week. Patient taking differently: Inject 1 mg into the skin every Thursday. 05/30/20   Karamalegos, Devonne Doughty, DO  pravastatin (PRAVACHOL) 20 MG tablet Take 1 tablet (20 mg total) by mouth daily. 05/30/20   [name], DO     Family History  Problem Relation Age of Onset   Heart attack Mother    Heart disease Mother    Parkinson's disease Father    Heart attack Maternal Grandfather    Heart disease Maternal Grandfather    Heart attack Maternal Aunt    Heart attack Maternal Grandmother    Prostate cancer Neg Hx    Bladder Cancer Neg Hx    Kidney cancer Neg Hx     Social History   Socioeconomic History   Marital status: Divorced    Spouse name: Not on file   Number of children: Not on file   Years of education: Not on file   Highest education level: Not on file  Occupational History   Not on file  Tobacco Use   Smoking status: Never   Smokeless tobacco: Never   Tobacco comments:    once a while had one cigar >10 years ago  Vaping Use   Vaping Use: Never used  Substance and Sexual Activity   Alcohol use: No    Alcohol/week: 0.0 standard drinks   Drug use: No   Sexual activity: Never  Other Topics Concern   Not on file  Social History Narrative   Lives alone   Social Determinants of Health   Financial Resource Strain: Not on file  Food Insecurity: Not on file  Transportation Needs: Not on file  Physical Activity: Not on file  Stress: Not on file  Social Connections: Not on file    Review of Systems: A 12 point ROS discussed and pertinent positives are indicated in the HPI above.  All other systems are negative.  Review of Systems  Vital Signs: There were no  vitals taken for this visit.  Physical Exam Constitutional:      Appearance: Normal appearance.  HENT:     Head: Normocephalic and atraumatic.  Eyes:     General: No scleral icterus. Pulmonary:     Effort: Pulmonary effort is normal.  Abdominal:     General: Abdomen is flat. There is no distension.     Palpations: Abdomen is soft.     Tenderness: There is no abdominal tenderness. There is no guarding.  Skin:    General: Skin is warm and dry.  Neurological:     Mental Status: He is alert and oriented to person, place, and time.  Psychiatric:        Mood and Affect: Mood normal.        Behavior: Behavior normal.      Imaging: CT ABDOMEN PELVIS WO CONTRAST  Result Date: 11/12/2020 CLINICAL DATA:  65 year old male with a history of gallbladder fossa abscess  status post percutaneous cholecystectomy treated by placement of a percutaneous drain on 10/02/2020. He presents today for follow-up evaluation. He has had 0 output over the past week and is clinically feeling better. EXAM: CT ABDOMEN AND PELVIS WITHOUT CONTRAST TECHNIQUE: Multidetector CT imaging of the abdomen and pelvis was performed following the standard protocol without IV contrast. COMPARISON:  Prior CT abdomen/pelvis 10/29/2020 FINDINGS: Lower chest: No acute abnormality. Hepatobiliary: Normal hepatic contour and morphology. Well-positioned percutaneous drainage catheter. Interval resolution of small fluid and gas from adjacent to the catheter site compared to prior imaging. Persistent phlegmonous change. No evidence of undrained abscess collection. No intra or extrahepatic biliary ductal dilatation. Pancreas: Unremarkable. No pancreatic ductal dilatation or surrounding inflammatory changes. Spleen: Normal in size without focal abnormality. Adrenals/Urinary Tract: Normal adrenal glands. Stable appearance of the kidneys. No hydronephrosis. Stable exophytic low-attenuation lesion which remains incompletely evaluated in the absence  of intravenous contrast. Unremarkable ureters. Symmetric wall thickening of the bladder without interval change. Stomach/Bowel: Colonic diverticular disease without CT evidence of active inflammation. No evidence of obstruction or focal bowel wall thickening. Normal appendix in the right lower quadrant. The terminal ileum is unremarkable. Vascular/Lymphatic: Limited evaluation in the absence of intravenous contrast. No aneurysm. Scattered atherosclerotic calcifications. No suspicious lymphadenopathy. Reproductive: Prostate is unremarkable. Other: No abdominal wall hernia or abnormality. No abdominopelvic ascites. Musculoskeletal: No acute fracture or aggressive appearing lytic or blastic osseous lesion. Multilevel degenerative disc disease. IMPRESSION: 1. Interval resolution of gallbladder fossa abscess. 2. There is some persistent phlegmon inflammatory change in the region. 3. Additional ancillary findings as above without significant interval change. Electronically Signed   By: Jacqulynn Cadet M.D.   On: 11/12/2020 13:42   CT ABDOMEN PELVIS WO CONTRAST  Result Date: 10/29/2020 CLINICAL DATA:  65 year old male with a history of gallbladder fossa abscess status post cholecystectomy, prior drainage EXAM: CT ABDOMEN AND PELVIS WITHOUT CONTRAST TECHNIQUE: Multidetector CT imaging of the abdomen and pelvis was performed following the standard protocol without IV contrast. COMPARISON:  10/13/2020 FINDINGS: Lower chest: No acute finding of the lower chest Hepatobiliary: Majority of liver parenchyma unremarkable. There is a rim of hypo density at the gallbladder fossa, adjacent to the indwelling percutaneous pigtail drainage catheter terminating within the gallbladder fossa. Overall there is decreasing fluid and gas collection at the pigtail drainage catheter, with some persisting ill-defined fluid/tissue near the drain. Improved inflammatory changes, though some edema persists within the adjacent fat extending  caudally. Surgical changes of cholecystectomy. Pancreas: Unremarkable Spleen: Unremarkable Adrenals/Urinary Tract: - Right adrenal gland:  Unremarkable - Left adrenal gland: Unremarkable. - Right kidney: No hydronephrosis, nephrolithiasis, inflammation, or ureteral dilation. No focal lesion. - Left Kidney: No hydronephrosis, nephrolithiasis, inflammation, or ureteral dilation. Redemonstration of low-density cyst of the left kidney, proximally 2 cm. - Urinary Bladder: Urinary bladder relatively decompressed. No inflammatory changes. Stomach/Bowel: - Stomach: Unremarkable. - Small bowel: Unremarkable - Appendix: Normal. - Colon: Moderate stool burden. Colonic diverticular disease without acute inflammatory changes. No evidence of obstruction. Vascular/Lymphatic: Mild calcifications of the abdominal aorta. No adenopathy. Reproductive: Unremarkable prostate Other: None Musculoskeletal: Degenerative changes of the spine. No significant bony canal narrowing. Vacuum disc phenomenon spanning L2-S1. Additional sites of the lower thoracic spine. Redemonstration of sclerotic focus in the lateral left eighth rib. No aggressive lytic lesions identified. IMPRESSION: Unchanged pigtail drainage catheter within the gallbladder fossa with near complete resolution of the abscess/fluid. Given the persistent small ill-defined fluid and gas collection and edema/inflammation, we elected to maintain the drainage catheter and forego  removing the drain at this time. Additional ancillary findings as above. Signed, Dulcy Fanny. Dellia Nims, RPVI [facility] Radiology Electronically Signed   By: Corrie Mckusick D.O.   On: 10/29/2020 17:31   CT ABDOMEN PELVIS WO CONTRAST  Result Date: 10/13/2020 CLINICAL DATA:  Status post cholecystectomy on 09/08/2020. Subsequent abscesses with percutaneous drain placement. EXAM: CT ABDOMEN AND PELVIS WITHOUT CONTRAST TECHNIQUE: Multidetector CT imaging of the  abdomen and pelvis was performed following the standard protocol without IV contrast. COMPARISON:  Sep 30, 2020 FINDINGS: Lower chest: Mild hypoventilatory changes. Coronary and aortic atherosclerosis. Hepatobiliary: Pigtail catheter along the gallbladder fossa is situated within gas and fluid collection measuring approximately 5.4 x 2.7 x 4.0 cm, decreased from the prior measurement of 6.8 x 3.6 x 5.2 cm. High density elements are again seen in the medial aspect of this collection. Minimal perihepatic free fluid. Pancreas: Unremarkable. No pancreatic ductal dilatation or surrounding inflammatory changes. Spleen: Normal in size without focal abnormality. Adrenals/Urinary Tract: Normal adrenal glands. Bilateral perirenal fat stranding. 2 cm probable left renal cyst. Normal gallbladder. Stomach/Bowel: Stomach is within normal limits. No evidence of appendicitis. No evidence of bowel wall thickening, distention, or inflammatory changes. Sigmoid diverticulosis without evidence of diverticulitis. Vascular/Lymphatic: Aortic atherosclerosis. No enlarged abdominal or pelvic lymph nodes. Reproductive: Prostate is unremarkable. Other: Persistent subcutaneous edema in the lateral abdominal wall and hips. Mild presacral edema. Musculoskeletal: Spondylosis of the thoracolumbar spine. In seen is nonspecific rib sclerotic lesion in the lateral left 8th rib. IMPRESSION: 1. Pigtail catheter along the gallbladder fossa is situated within gas and fluid collection measuring approximately 5.4 x 2.7 x 4.0 cm, decreased from the prior measurement of 6.8 x 3.6 x 5.2 cm. High density elements are again seen in the medial aspect of this collection. 2. Minimal perihepatic free fluid. 3. Sigmoid diverticulosis without evidence of diverticulitis. 4. Persistent subcutaneous edema in the lateral abdominal wall and overlying bilateral hips. 5. Nonspecific rib sclerotic lesion in the lateral left 8th rib, stable from 2018. Aortic Atherosclerosis  (ICD10-I70.0). Electronically Signed   By: Fidela Salisbury M.D.   On: 10/13/2020 17:37   IR Radiologist Eval & Mgmt  Result Date: 11/12/2020 Please refer to notes tab for details about interventional procedure. (Op Note)  IR Radiologist Eval & Mgmt  Result Date: 10/29/2020 Please refer to notes tab for details about interventional procedure. (Op Note)   Labs:  CBC: Recent Labs    10/13/20 0632 10/14/20 0641 10/15/20 0543 10/30/20 1138  WBC 6.1 6.3 5.7 10.8*  HGB 9.8* 10.2* 9.5* 11.8*  HCT 30.7* 31.4* 29.1* 36.1*  PLT 256 233 215 342    COAGS: Recent Labs    09/25/20 1444 10/04/20 0627  INR 1.5* 1.2  APTT 40* 38*    BMP: Recent Labs    07/21/20 1108 08/19/20 1000 09/09/20 0427 10/13/20 0632 10/14/20 0641 10/15/20 0543 10/30/20 1138  NA 137 136   < > 142 143 141 138  K 4.5 4.6   < > 3.5 3.3* 3.4* 4.6  CL 102 99   < > 110 109 110 106  CO2 26 28   < > 21* 23 23 23   GLUCOSE 92 102*   < > 94 101* 99 126*  BUN 13 18   < > 24* 22 20 34*  CALCIUM 9.0 9.5   < > 8.3* 8.5* 8.5* 9.4  CREATININE 1.10 1.10   < > 1.82* 1.82* 1.85* 1.32*  GFRNONAA 70 70   < >  41* 41* 40* 60*  GFRAA 81 81  --   --   --   --   --    < > = values in this interval not displayed.    LIVER FUNCTION TESTS: Recent Labs    10/03/20 0356 10/04/20 0627 10/06/20 0419 10/07/20 0331 10/08/20 1010 10/09/20 0445 10/30/20 1138  BILITOT 1.2  --  1.2 1.2  --   --  0.9  AST 39  --  67* 64*  --   --  25  ALT 24  --  62* 64*  --   --  28  ALKPHOS 168*  --  156* 156*  --   --  103  PROT 5.6*  --  6.4* 6.8  --   --  8.3*  ALBUMIN 1.9*  1.9*   < > 2.3* 2.3*  2.3* 2.3* 2.4* 4.0   < > = values in this interval not displayed.    TUMOR MARKERS: No results for input(s): AFPTM, CEA, CA199, CHROMGRNA in the last 8760 hours.  Assessment and Plan:  No drain output for the past week.  Abscess resolved on CT imaging.  Drainage catheter was removed.    Electronically Signed: Criselda Peaches 11/12/2020, 3:20 PM   I spent a total of 15 Minutes in face to face in clinical consultation, greater than 50% of which was counseling/coordinating care for gallbladder fossa abscess with drain in place.

## 2020-11-20 ENCOUNTER — Other Ambulatory Visit: Payer: Self-pay | Admitting: Family Medicine

## 2020-11-20 DIAGNOSIS — I10 Essential (primary) hypertension: Secondary | ICD-10-CM

## 2020-11-20 NOTE — Telephone Encounter (Signed)
  Notes to clinic:  Medication filled by a different provider  Review for refill    Requested Prescriptions  Pending Prescriptions Disp Refills   amLODipine (NORVASC) 10 MG tablet [Pharmacy Med Name: AMLODIPINE BESYLATE 10 MG TAB] 30 tablet 0    Sig: TAKE 1 TABLET DAILY      Cardiovascular:  Calcium Channel Blockers Failed - 11/20/2020  9:01 AM      Failed - Last BP in normal range    BP Readings from Last 1 Encounters:  11/03/20 (!) 146/87          Passed - Valid encounter within last 6 months    Recent Outpatient Visits           2 weeks ago Intra-abdominal abscess post-procedure   [place], DO   3 months ago Pre-op evaluation   [place], DO   4 months ago Acute cholecystitis   [place], DO   4 months ago Generalized abdominal pain   [place], DO   5 months ago Type 2 diabetes mellitus with diabetic polyneuropathy, without long-term current use of insulin ([place])   [facility] [place], Devonne Doughty, DO       Future Appointments             In 2 weeks Mickle Plumb, Jacquelyn D, PA-C [place], [place]

## 2020-11-24 DIAGNOSIS — E1142 Type 2 diabetes mellitus with diabetic polyneuropathy: Secondary | ICD-10-CM | POA: Diagnosis not present

## 2020-11-24 DIAGNOSIS — Z8551 Personal history of malignant neoplasm of bladder: Secondary | ICD-10-CM | POA: Diagnosis not present

## 2020-11-24 DIAGNOSIS — D649 Anemia, unspecified: Secondary | ICD-10-CM | POA: Diagnosis not present

## 2020-11-24 DIAGNOSIS — Z48815 Encounter for surgical aftercare following surgery on the digestive system: Secondary | ICD-10-CM | POA: Diagnosis not present

## 2020-11-24 DIAGNOSIS — I4891 Unspecified atrial fibrillation: Secondary | ICD-10-CM | POA: Diagnosis not present

## 2020-11-24 DIAGNOSIS — E43 Unspecified severe protein-calorie malnutrition: Secondary | ICD-10-CM | POA: Diagnosis not present

## 2020-11-25 ENCOUNTER — Other Ambulatory Visit: Payer: Self-pay | Admitting: Family Medicine

## 2020-11-25 DIAGNOSIS — I4891 Unspecified atrial fibrillation: Secondary | ICD-10-CM

## 2020-11-25 DIAGNOSIS — I480 Paroxysmal atrial fibrillation: Secondary | ICD-10-CM

## 2020-11-25 NOTE — Telephone Encounter (Signed)
   Notes to clinic:  Medication was filled by a different provider  Review for continued use and refill    Requested Prescriptions  Pending Prescriptions Disp Refills   ELIQUIS 5 MG TABS tablet [Pharmacy Med Name: ELIQUIS 5 MG TABLET] 60 tablet 0    Sig: TAKE 1 TABLET TWICE A DAY      Hematology:  Anticoagulants Failed - 11/25/2020  1:20 PM      Failed - HGB in normal range and within 360 days    Hemoglobin  Date Value Ref Range Status  10/30/2020 11.8 (L) 13.0 - 17.0 g/dL Final          Failed - HCT in normal range and within 360 days    HCT  Date Value Ref Range Status  10/30/2020 36.1 (L) 39.0 - 52.0 % Final          Failed - Cr in normal range and within 360 days    Creat  Date Value Ref Range Status  08/19/2020 1.10 0.70 - 1.25 mg/dL Final    Comment:    For patients >81 years of age, the reference limit for Creatinine is approximately 13% higher for people identified as African-American. .    Creatinine, Ser  Date Value Ref Range Status  10/30/2020 1.32 (H) 0.61 - 1.24 mg/dL Final   Creatinine, Urine  Date Value Ref Range Status  09/26/2020 197 mg/dL Final          Passed - PLT in normal range and within 360 days    Platelets  Date Value Ref Range Status  10/30/2020 342 150 - 400 K/uL Final          Passed - Valid encounter within last 12 months    Recent Outpatient Visits           3 weeks ago Intra-abdominal abscess post-procedure   [place], DO   3 months ago Pre-op evaluation   [place], DO   4 months ago Acute cholecystitis   [place], DO   4 months ago Generalized abdominal pain   [place], DO   5 months ago Type 2 diabetes mellitus with diabetic polyneuropathy, without long-term current use of insulin ([place])   [facility] [place], Devonne Doughty, DO        Future Appointments             In 1 week Marrianne Mood D, PA-C [place], [facility]

## 2020-12-08 ENCOUNTER — Encounter: Payer: Self-pay | Admitting: Physician Assistant

## 2020-12-08 ENCOUNTER — Ambulatory Visit: Payer: Medicare Other | Admitting: Physician Assistant

## 2020-12-08 ENCOUNTER — Other Ambulatory Visit: Payer: Self-pay

## 2020-12-08 VITALS — BP 138/82 | HR 69 | Ht 76.0 in | Wt 232.2 lb

## 2020-12-08 DIAGNOSIS — I480 Paroxysmal atrial fibrillation: Secondary | ICD-10-CM

## 2020-12-08 DIAGNOSIS — N170 Acute kidney failure with tubular necrosis: Secondary | ICD-10-CM

## 2020-12-08 DIAGNOSIS — I10 Essential (primary) hypertension: Secondary | ICD-10-CM | POA: Diagnosis not present

## 2020-12-08 DIAGNOSIS — G4730 Sleep apnea, unspecified: Secondary | ICD-10-CM | POA: Diagnosis not present

## 2020-12-08 DIAGNOSIS — E119 Type 2 diabetes mellitus without complications: Secondary | ICD-10-CM

## 2020-12-08 NOTE — Progress Notes (Signed)
Office Visit    Patient Name: Richard Oconnell Date of Encounter: 12/08/2020  PCP:  [name], [place]  Cardiologist:  Dr. Rockey Situ Advanced Practice Provider:  No care team member to display Electrophysiologist:  None   [id number]   Chief Complaint    Chief Complaint  Patient presents with   Other    Afib pt would like to discuss if him having sepsis contributed to him having afib. Meds reviewed verbally with pt.    65 y.o. male with a hx of HTN, DM2, bladder CA, previous admission with acute cholecystitis s/p cholecystotomy tube 2/24 with follow-up robotic assisted cholecystectomy 4/25, and who is being seen today for the evaluation of atrial fibrillation when high risk for [facility] and in the setting of septic shock and bacteremia with large abdominal abscess and multiorgan failure wcomplicated by severe metabolic acidosis and toxic metabolic encephalopathy and here today for hospital follow-up.  Past Medical History    Past Medical History:  Diagnosis Date   Cancer [facility])    Bladder Cancer   Erectile dysfunction associated with type 2 diabetes mellitus ([place]) 05/26/2016   Gross hematuria 11/16/2016   Hypertension 06/11/2015   Type 2 diabetes mellitus with diabetic polyneuropathy, without long-term current use of insulin ([place]) 06/11/2015   Past Surgical History:  Procedure Laterality Date   boil lanceted     hand   COLONOSCOPY WITH PROPOFOL N/A 07/03/2020   Procedure: COLONOSCOPY WITH PROPOFOL;  Surgeon: Jonathon Bellows, MD;  Location: [facility] ENDOSCOPY;  Service: Gastroenterology;  Laterality: N/A;   CYSTOSCOPY W/ RETROGRADES Bilateral 12/06/2016   Procedure: CYSTOSCOPY WITH RETROGRADE PYELOGRAM;  Surgeon: Hollice Espy, MD;  Location: [facility] ORS;  Service: Urology;  Laterality: Bilateral;   CYSTOSCOPY W/ RETROGRADES Bilateral 06/27/2017   Procedure: CYSTOSCOPY WITH RETROGRADE PYELOGRAM;  Surgeon: Hollice Espy, MD;  Location: [facility] ORS;   Service: Urology;  Laterality: Bilateral;   CYSTOSCOPY WITH BIOPSY N/A 06/27/2017   Procedure: CYSTOSCOPY WITH Bladder BIOPSY;  Surgeon: Hollice Espy, MD;  Location: [facility] ORS;  Service: Urology;  Laterality: N/A;   CYSTOSCOPY WITH BIOPSY N/A 10/24/2017   Procedure: CYSTOSCOPY WITH Bladder BIOPSY;  Surgeon: Hollice Espy, MD;  Location: [facility] ORS;  Service: Urology;  Laterality: N/A;   ingrown  Bilateral    ingrown toenail   IR BILIARY DRAIN PLACEMENT WITH CHOLANGIOGRAM  07/10/2020   IR CHOLANGIOGRAM EXISTING TUBE  08/07/2020   IR RADIOLOGIST EVAL & MGMT  08/07/2020   IR RADIOLOGIST EVAL & MGMT  10/29/2020   IR RADIOLOGIST EVAL & MGMT  11/12/2020   TRANSURETHRAL RESECTION OF BLADDER TUMOR N/A 12/06/2016   Procedure: TRANSURETHRAL RESECTION OF BLADDER TUMOR (TURBT) (2-5cm) CLOT EVACUATION;  Surgeon: Hollice Espy, MD;  Location: [facility] ORS;  Service: Urology;  Laterality: N/A;   TRANSURETHRAL RESECTION OF BLADDER TUMOR N/A 06/27/2017   Procedure: TRANSURETHRAL RESECTION OF BLADDER TUMOR (TURBT);  Surgeon: Hollice Espy, MD;  Location: [facility] ORS;  Service: Urology;  Laterality: N/A;   TRANSURETHRAL RESECTION OF BLADDER TUMOR WITH MITOMYCIN-C N/A 01/18/2017   Procedure: TRANSURETHRAL RESECTION OF BLADDER TUMOR WITH MITOMYCIN-C-(SMALL);  Surgeon: Hollice Espy, MD;  Location: [facility] ORS;  Service: Urology;  Laterality: N/A;    Allergies  Allergies  Allergen Reactions   Cyclobenzaprine Other (See Comments)    Not improving pain and makes him agressive.   Lisinopril Cough    History of Present Illness    Richard Oconnell is a 65 y.o. male with PMH as  above. Richard Oconnell is a 65 yo male with PMH as above and no previously known history of arrhythmia or heart dz.   He presented to [facility]/2020 with large abdominal abscess and severe septic shock with bacteremia and multiorgan failure, complicated by severe metabolic acidosis and toxic metabolic encephalopathy. Per family, the patient became  disoriented and then skin appeared jaundiced, leading him to present to [facility].   He was admitted to [facility] with bacteremia/septic shock related to an abdominal abscess, as well as acute renal failure with ATN, requiring temporary hemodialysis.  Cardiology consulted for possible PAF with [place] not recommended in the setting of high risk for bleeding with liver dysfunction and large abscess. Amiodarone was noted as reportedly discontinued in the setting of junctional rhythm. On review of previous EKGs at time of consultation, the team was able to find evidence of atrial fibrillation from 07/09/2020, though on review of prior to admission medications, he had not been maintained on anticoagulation since Afib EKG on February 2022. He was initially started on amiodarone for his atrial fibrillation; however, this was discontinued due to bradycardia/junctional rhythm.  He was initially not started on any anticoagulation due to his comorbidities at the time of admission and including sepsis, abdominal abscess with drainage, and elevated LFTs.  By discharge, he was anticoagulated with Eliquis.  Due to elevated BP, it was noted that he should start on ACE/ARB if renal function would allow at RTC.  09/27/2020 echo showed EF 60 to 65%, NR WMA, G2 DD, RVSP 21.5 mmHg, and no valve vegetation.  Today, 12/08/2020, he returns to clinic and notes that he has been doing well since discharge.  He denies any chest pain.  He denies tachypalpitations and does not feel atrial fibrillation.  No presyncope or syncope.  No reported signs or symptoms of volume overload.  He reports that he does not remember a good portion of the admission, including that he had temporary hemodialysis.  He has been scheduled with [facility] and will have follow-up with them tomorrow.  He was screened for sleep apnea and while he did score an adequate amount of points for WatchPat, this was then canceled given he does not have a smart phone.  His  wife brings with her BP readings and states that his BP over the last week was 116/73 with heart rate 70 to 80s.  Home Medications   Current Outpatient Medications  Medication Instructions   amLODipine (NORVASC) 10 MG tablet TAKE 1 TABLET DAILY   amoxicillin-clavulanate (AUGMENTIN) 875-125 MG tablet 1 tablet, Oral, 2 times daily, Extended for 7 more days   ELIQUIS 5 MG TABS tablet TAKE 1 TABLET TWICE A DAY   metoprolol tartrate (LOPRESSOR) 25 mg, Oral, 2 times daily   Ozempic (0.25 or 0.5 MG/DOSE) 1 mg, Subcutaneous, Weekly   pravastatin (PRAVACHOL) 20 mg, Oral, Daily     Review of Systems    He denies chest pain, palpitations, dyspnea, pnd, orthopnea, n, v, dizziness, syncope, edema, weight gain, or early satiety.   All other systems reviewed and are otherwise negative except as noted above.  Physical Exam    VS:  BP 138/82 (BP Location: Left Arm, Patient Position: Sitting, Cuff Size: Normal)   Pulse 69   Ht '6\' 4"'$  (1.93 m)   Wt 232 lb 4 oz (105.3 kg)   SpO2 98%   BMI 28.27 kg/m  , BMI Body mass index is 28.27 kg/m. GEN: Well nourished, well developed, in no acute distress. HEENT:  normal. Neck: Supple, no JVD, carotid bruits, or masses. Cardiac: RRR, no murmurs, rubs, or gallops. No clubbing, cyanosis, edema.  Radials/DP/PT 2+ and equal bilaterally.  Respiratory:  Respirations regular and unlabored, clear to auscultation bilaterally. GI: Soft, nontender, nondistended, BS + x 4. MS: no deformity or atrophy. Skin: warm and dry, no rash. Neuro:  Strength and sensation are intact. Psych: Normal affect.  Accessory Clinical Findings    ECG personally reviewed by me today - SR with first degree AVB, LAD/IVCD - no acute changes.  VITALS Reviewed today   Temp Readings from Last 3 Encounters:  11/03/20 98 F (36.7 C) (Oral)  10/15/20 98 F (36.7 C) (Oral)  09/16/20 98.3 F (36.8 C) (Oral)   BP Readings from Last 3 Encounters:  12/08/20 138/82  11/03/20 (!) 146/87   10/31/20 (!) 150/93   Pulse Readings from Last 3 Encounters:  12/08/20 69  11/03/20 76  10/31/20 78    Wt Readings from Last 3 Encounters:  12/08/20 232 lb 4 oz (105.3 kg)  11/03/20 220 lb (99.8 kg)  10/31/20 220 lb (99.8 kg)     LABS  reviewed today   Lab Results  Component Value Date   WBC 10.8 (H) 10/30/2020   HGB 11.8 (L) 10/30/2020   HCT 36.1 (L) 10/30/2020   MCV 85.7 10/30/2020   PLT 342 10/30/2020   Lab Results  Component Value Date   CREATININE 1.32 (H) 10/30/2020   BUN 34 (H) 10/30/2020   NA 138 10/30/2020   K 4.6 10/30/2020   CL 106 10/30/2020   CO2 23 10/30/2020   Lab Results  Component Value Date   ALT 28 10/30/2020   AST 25 10/30/2020   ALKPHOS 103 10/30/2020   BILITOT 0.9 10/30/2020   Lab Results  Component Value Date   CHOL 105 07/21/2020   HDL 33 (L) 07/21/2020   LDLCALC 45 07/21/2020   TRIG 226 (H) 09/28/2020   CHOLHDL 3.2 07/21/2020    Lab Results  Component Value Date   HGBA1C 6.7 (H) 09/26/2020   Lab Results  Component Value Date   TSH 0.160 (L) 07/11/2020     STUDIES/PROCEDURES reviewed today   Echo 09/27/20 . Left ventricular ejection fraction, by estimation, is 60 to 65%. The  left ventricle has normal function. The left ventricle has no regional  wall motion abnormalities. Left ventricular diastolic parameters are  consistent with Grade II diastolic  dysfunction (pseudonormalization).   2. Right ventricular systolic function is normal. The right ventricular  size is normal. There is normal pulmonary artery systolic pressure. The  estimated right ventricular systolic pressure is [id number] mmHg.   3. The mitral valve is normal in structure. No evidence of mitral valve  regurgitation. No evidence of mitral stenosis.   4. No valve vegetation.   Assessment & Plan    Paroxysmal atrial fibrillation with RVR --Asymptomatic.  Denies any associated palpitations with atrial fibrillation.  During his admission, he was started on IV  amiodarone, discontinued after bradycardia and idioventricular rhythm.  Na was monitored throughout his admission with episodes of transient bradycardia and idioventricular rhythm.  It was suspected that this could possibly be due to his increased vagal tone, given his intra-abdominal infection and acute illness.  He was eventually started on Lopressor 25 mg twice daily, which she has been tolerating well. Maintaining NSR today with first-degree AV block (which we will monitor on his BB).  Continue current dose Lopressor. He was started on apixaban 5 mg twice daily  prior to discharge and is tolerating this well without any reported signs and symptoms of bleeding.  Continue due to CHA2DS2-VASc score of at least 3 (age, hypertension, DM2) to reduce the risk of thromboembolic event.  We will check a CBC and BMET today to ensure proper dosing of apixaban and stable blood counts since starting anticoagulation.   Essential hypertension --BP today 138/82 with goal BP 130/80.  We will obtain a BMET and if stable renal function, start valsartan given comorbid DM2.  Possible sleep apnea - Stop bang completed under rooming tab.  While he qualifies for a watch pat, he does not have a smart phone and thus this study was discontinued.  Consider sleep study in the future, given untreated sleep apnea is often associated with atrial fibrillation and elevated BP.  Acute RF with improved function following HD --BMET today with recommendations regarding ARB after that time.    Medication changes: Valsartan recommendations to be provided later this week and after labs. Labs ordered: CBC, BMET Studies / Imaging ordered: None Future considerations: Valsartan as above Disposition: RTC 3 mo  *Please be aware that the above documentation was completed voice recognition software and may contain dictation errors.      Arvil Chaco, PA-C 12/08/2020

## 2020-12-08 NOTE — Patient Instructions (Signed)
Medication Instructions:   Your physician recommends that you continue on your current medications as directed. Please refer to the Current Medication list given to you today.  Richard Oconnell will call you with regards to the Valsartan after your blood work.   Lab Work:  CBC, BMP to be drawn to today.  If you have labs (blood work) drawn today and your tests are completely normal, you will receive your results only by: [place] (if you have MyChart) OR A paper copy in the mail If you have any lab test that is abnormal or we need to change your treatment, we will call you to review the results.   Testing/Procedures:  WatchPAT?  Is a FDA cleared portable home sleep study test that uses a watch and 3 points of contact to monitor 7 different channels, including your heart rate, oxygen saturations, body position, snoring, and chest motion.  The study is easy to use from the comfort of your own home and accurately detect sleep apnea.  Before bed, you attach the chest sensor, attached the sleep apnea bracelet to your nondominant hand, and attach the finger probe.  After the study, the raw data is downloaded from the watch and scored for apnea events.   For more information: [email or web address]      Follow-Up: At [facility], you and your health needs are our priority.  As part of our continuing mission to provide you with exceptional heart care, we have created designated Provider Care Teams.  These Care Teams include your primary Cardiologist (physician) and Advanced Practice Providers (APPs -  Physician Assistants and Nurse Practitioners) who all work together to provide you with the care you need, when you need it.  We recommend signing up for the patient portal called "MyChart".  Sign up information is provided on this After Visit Summary.  MyChart is used to connect with patients for Virtual Visits (Telemedicine).  Patients are able to view lab/test results,  encounter notes, upcoming appointments, etc.  Non-urgent messages can be sent to your provider as well.   To learn more about what you can do with MyChart, go to [email or web address].    Your next appointment:   3 month(s)  The format for your next appointment:   In Person  Provider:   You may one of the following Advanced Practice Providers on your designated Care Team:   Murray Hodgkins, NP Christell Faith, PA-C Marrianne Mood, PA-C Cadence Kathlen Mody, [place]   Other Instructions

## 2020-12-09 LAB — BASIC METABOLIC PANEL
BUN/Creatinine Ratio: 22 (ref 10–24)
BUN: 24 mg/dL (ref 8–27)
CO2: 22 mmol/L (ref 20–29)
Calcium: 9.8 mg/dL (ref 8.6–10.2)
Chloride: 100 mmol/L (ref 96–106)
Creatinine, Ser: 1.08 mg/dL (ref 0.76–1.27)
Glucose: 107 mg/dL — ABNORMAL HIGH (ref 65–99)
Potassium: 4.8 mmol/L (ref 3.5–5.2)
Sodium: 138 mmol/L (ref 134–144)
eGFR: 76 mL/min/{1.73_m2} (ref >59)

## 2020-12-09 LAB — CBC
Hematocrit: 41.8 % (ref 37.5–51.0)
Hemoglobin: 13.7 g/dL (ref 13.0–17.7)
MCH: 28.4 pg (ref 26.6–33.0)
MCHC: 32.8 g/dL (ref 31.5–35.7)
MCV: 87 fL (ref 79–97)
Platelets: 306 10*3/uL (ref 150–450)
RBC: 4.83 x10E6/uL (ref 4.14–5.80)
RDW: 17.1 % — ABNORMAL HIGH (ref 11.6–15.4)
WBC: 8.8 10*3/uL (ref 3.4–10.8)

## 2020-12-10 ENCOUNTER — Ambulatory Visit (INDEPENDENT_AMBULATORY_CARE_PROVIDER_SITE_OTHER): Payer: Medicare Other | Admitting: Pharmacist

## 2020-12-10 DIAGNOSIS — E1142 Type 2 diabetes mellitus with diabetic polyneuropathy: Secondary | ICD-10-CM | POA: Diagnosis not present

## 2020-12-10 DIAGNOSIS — I10 Essential (primary) hypertension: Secondary | ICD-10-CM | POA: Diagnosis not present

## 2020-12-10 DIAGNOSIS — N179 Acute kidney failure, unspecified: Secondary | ICD-10-CM | POA: Diagnosis not present

## 2020-12-10 NOTE — Patient Instructions (Signed)
Visit Information  PATIENT GOALS:  Goals Addressed             This Visit's Progress    Pharmacy Goals       Our goal bad cholesterol, or LDL, is less than 70 . This is why it is important to continue taking your pravastatin  Please check your home blood pressure, keep a log of the results and bring this with you to your medical appointments.  Feel free to call me with any questions or concerns. I look forward to our next call!   Wallace Cullens, PharmD, [place] [phone]        The patient verbalized understanding of instructions, educational materials, and care plan provided today and declined offer to receive copy of patient instructions, educational materials, and care plan.   Telephone follow up appointment with care management team member scheduled for: 03/11/2021 at 10:00 AM

## 2020-12-10 NOTE — Chronic Care Management (AMB) (Signed)
Chronic Care Management Pharmacy Note  12/10/2020 Name:  Richard Oconnell MRN:  [id number] DOB:  October 17, 1955   Subjective: Richard Oconnell is an 65 y.o. year old male who is a primary patient of [name], DO.  The CCM team was consulted for assistance with disease management and care coordination needs.    Engaged with patient by telephone for follow up visit in response to provider referral for pharmacy case management and/or care coordination services.   Consent to Services:  The patient was given information about Chronic Care Management services, agreed to services, and gave verbal consent prior to initiation of services.  Please see initial visit note for detailed documentation.   Patient Care Team: [name], DO as PCP - General (Family Medicine) Winfield Cunas Virl Diamond, RPH-CPP as Pharmacist   Recent consult visits: Office Visit with [facility] on 7/25.   Hospital visits: Patient admitted to [facility] from 5/12 to 6/1 for sepsis with acute renal failure and septic shock related to abscess  Objective:  Lab Results  Component Value Date   CREATININE 1.08 12/08/2020   CREATININE 1.32 (H) 10/30/2020   CREATININE 1.85 (H) 10/15/2020    Lab Results  Component Value Date   HGBA1C 6.7 (H) 09/26/2020   Last diabetic Eye exam: No results found for: HMDIABEYEEXA  Last diabetic Foot exam: No results found for: HMDIABFOOTEX      Component Value Date/Time   CHOL 105 07/21/2020 1108   TRIG 226 (H) 09/28/2020 0418   HDL 33 (L) 07/21/2020 1108   CHOLHDL 3.2 07/21/2020 1108   VLDL 51 (H) 11/24/2016 0818   LDLCALC 45 07/21/2020 1108    Hepatic Function Latest Ref Rng & Units 10/30/2020 10/09/2020 10/08/2020  Total Protein 6.5 - 8.1 g/dL 8.3(H) - -  Albumin 3.5 - 5.0 g/dL 4.0 2.4(L) 2.3(L)  AST 15 - 41 U/L 25 - -  ALT 0 - 44 U/L 28 - -  Alk Phosphatase 38 - 126 U/L 103 - -  Total Bilirubin 0.3 - 1.2  mg/dL 0.9 - -  Bilirubin, Direct 0.0 - 0.2 mg/dL - - -     Social History   Tobacco Use  Smoking Status Never  Smokeless Tobacco Never  Tobacco Comments   once a while had one cigar >10 years ago   BP Readings from Last 3 Encounters:  12/08/20 138/82  11/03/20 (!) 146/87  10/31/20 (!) 150/93   Pulse Readings from Last 3 Encounters:  12/08/20 69  11/03/20 76  10/31/20 78   Wt Readings from Last 3 Encounters:  12/08/20 232 lb 4 oz (105.3 kg)  11/03/20 220 lb (99.8 kg)  10/31/20 220 lb (99.8 kg)    Assessment: Review of patient past medical history, allergies, medications, health status, including review of consultants reports, laboratory and other test data, was performed as part of comprehensive evaluation and provision of chronic care management services.   SDOH:  (Social Determinants of Health) assessments and interventions performed: none   CCM Care Plan  Allergies  Allergen Reactions   Cyclobenzaprine Other (See Comments)    Not improving pain and makes him agressive.   Lisinopril Cough    Medications Reviewed Today     Reviewed by Vella Raring, RPH-CPP (Pharmacist) on 12/10/20 at 1010  Med List Status: <None>   Medication Order Taking? Sig Documenting Provider Last Dose Status Informant  amLODipine (NORVASC) 10 MG tablet [id number] Yes TAKE 1 TABLET DAILY Nobie Putnam  J, DO Taking Active   ELIQUIS 5 MG TABS tablet [id number] Yes TAKE 1 TABLET TWICE A DAY Karamalegos, Alexander J, DO Taking Active   metoprolol tartrate (LOPRESSOR) 25 MG tablet [id number] Yes Take 1 tablet (25 mg total) by mouth 2 (two) times daily. Kathie Dike, MD Taking Active   OZEMPIC, 0.25 OR 0.5 MG/DOSE, 2 MG/1.5ML SOPN [id number] Yes Inject 1 mg into the skin once a week.  Patient taking differently: Inject 1 mg into the skin every Thursday.   [name], DO Taking Active            Med Note Kellie Simmering, Jenene Slicker   Fri Aug 15, 2020  2:42 PM)    pravastatin  (PRAVACHOL) 20 MG tablet [id number]  Take 1 tablet (20 mg total) by mouth daily. [name], DO  Active Self            Patient Active Problem List   Diagnosis Date Noted   Pressure injury of skin 10/13/2020   Protein-calorie malnutrition, severe 10/10/2020   Postprocedural intraabdominal abscess    Acute respiratory failure ([place]) 09/28/2020   Encounter for nasogastric (NG) tube placement    Altered mental status    Abscess    Acute kidney failure, unspecified ([place])    Sepsis with acute renal failure and septic shock ([place]) 09/25/2020   S/P laparoscopic cholecystectomy 97/67/3419   Metabolic syndrome 37/90/2409   Atrial fibrillation ([place]) 07/09/2020   Class 1 obesity due to excess calories with serious comorbidity and body mass index (BMI) of 33.0 to 33.9 in adult 07/06/2019   Malignant neoplasm of urinary bladder ([place]) 12/06/2018   Overweight (BMI 25.0-29.9) 07/04/2018   Hyperlipidemia associated with type 2 diabetes mellitus ([place]) 11/30/2016   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   Gross hematuria 11/16/2016   Erectile dysfunction associated with type 2 diabetes mellitus ([place]) 05/26/2016   Diabetes mellitus with coincident hypertension ([place]) 06/11/2015   Type 2 diabetes mellitus with diabetic polyneuropathy, without long-term current use of insulin ([place]) 06/11/2015    Immunization History  Administered Date(s) Administered   Moderna Sars-Covid-2 Vaccination 11/30/2019, 12/28/2019   Pneumococcal Conjugate-13 05/30/2020    Conditions to be addressed/monitored: T2DM, HLD, atrial fibrillation  Care Plan : PharmD - Medication Assistance  Updates made by Vella Raring, RPH-CPP since 12/10/2020 12:00 AM     Problem: Disease Progression      Long-Range Goal: Disease Progression Prevented or Minimized   Start Date: 06/06/2020  Expected End Date: 09/04/2020  This Visit's Progress: On track  Recent Progress: On track  Priority: High  Note:   Current Barriers:   Difficulty with independently affording treatment regimen. Reports cost of Ozempic is difficult to afford, particularly when enters coverage gap of [name] Patient APPROVED for patient assistance from [place] through 05/16/2021  Pharmacist Clinical Goal(s):  Over the next 90 days, patient will verbalize ability to afford treatment regimen through collaboration with PharmD and provider.   Interventions: 1:1 collaboration with [name], DO regarding development and update of comprehensive plan of care as evidenced by provider attestation and co-signature Inter-disciplinary care team collaboration (see longitudinal plan of care) Perform chart review Per telephone note from 6/24, PCP advised collaborated with [facility] and providers agreed to extend patient's Augmentin course for 7 more days Office Visit with [facility] on 7/25. Patient advised Cardiologist would call him to follow up regarding possible addition of valsartan after his blood work Comprehensive medication review performed; medication  list updated in electronic medical record Reports completed additional course of Augmentin as prescribed by PCP. Reports doing well; denies signs of infection Reports completed Home Health Physical Therapy Reports planning to attend appointment with Nephrology this afternoon  Type 2 Diabetes: Current treatment: Ozempic 15m weekly Reports last checked last week: 119   Hypertension/ Atrial Fibrillation Current treatment: Eliquis 5 mg twice daily Metoprolol 25 mg twice daily Amlodipine 10 mg daily Reports has been monitoring home BP and keeping log, but does not have record with him today Recalls BP today ~133/73 Denies symptoms of hypotension/hypertension Encourage patient to continue to monitor home BP/HR, keep log of results and bring record with him to medical appointments   Hyperlipidemia:  Controlled; current  treatment: pravastatin 20 mg daily Encourage patient to order refill of pravastatin  Medication Adherence: Reports currently taking medications from pill bottles and denies missed doses Encourage patient to start using weekly pillbox as adherence tool  Medication Assistance: Have counseled patient on Eliquis patient assistance program, including income and annual out of pocket expense requirements Note patient has previously advised that he has applied for Extra Help subsidy, but does not qualify based on assets Patient reports cost of Eliquis is affordable for now, but may contact CM Pharmacist when close to/has met out of pocket requirement for 2022 calendar year Note patient currently purchasing diabetes testing supplies OTC. Have counseled on option for receiving these supplies through health plan with Rx for cost savings. Patient to let CM Pharmacist know if interested in the future  Patient Goals/Self-Care Activities Over the next 90 days, patient will:  - check glucose, document, and provide at future appointments - check blood pressure, document, and provide at future appointments - collaborate with provider on medication access solutions - attend medical appointments as scheduled   Next appointment with Nephrology today Next appointment with Cardiology on 10/25     Follow Up Plan:  Telephone follow up appointment with care management team member scheduled for: 03/11/2021 at 10:00 AM     Medication Assistance: Patient APPROVED for patient assistance from [place] 05/16/2021  Patient's preferred pharmacy is:  [address]Phone: [phone]: [address] [address]Phone: [phone] Fax: [phone]-[address] [place]Phone: [phone]: [phone] Uses pill box? Yes  Follow Up:  Patient agrees to Care Plan and Follow-up.  EWallace Cullens PharmD, BPara March CPP Clinical Pharmacist [phone]

## 2020-12-11 DIAGNOSIS — I10 Essential (primary) hypertension: Secondary | ICD-10-CM | POA: Insufficient documentation

## 2020-12-12 ENCOUNTER — Ambulatory Visit: Payer: Self-pay | Admitting: Pharmacist

## 2020-12-12 DIAGNOSIS — I10 Essential (primary) hypertension: Secondary | ICD-10-CM

## 2020-12-12 NOTE — Patient Instructions (Signed)
Visit Information  PATIENT GOALS:  Goals Addressed             This Visit's Progress    Pharmacy Goals       Our goal bad cholesterol, or LDL, is less than 70 . This is why it is important to continue taking your pravastatin  Please check your home blood pressure, keep a log of the results and bring this with you to your medical appointments.  Feel free to call me with any questions or concerns. I look forward to our next call!  Wallace Cullens, PharmD, [place] [phone]        The patient verbalized understanding of instructions, educational materials, and care plan provided today and declined offer to receive copy of patient instructions, educational materials, and care plan.   Telephone follow up appointment with care management team member scheduled for: 03/11/2021 at 10:00 AM

## 2020-12-12 NOTE — Chronic Care Management (AMB) (Signed)
Chronic Care Management Pharmacy Note  12/12/2020 Name:  Richard Oconnell MRN:  [id number] DOB:  1955/11/09   Subjective: Richard Oconnell is an 65 y.o. year old male who is a primary patient of [name], DO.  The CCM team was consulted for assistance with disease management and care coordination needs.    Receive a voicemail from patient requesting a call back.  Engaged with patient by telephone for follow up visit in response to provider referral for pharmacy case management and/or care coordination services.   Consent to Services:  The patient was given information about Chronic Care Management services, agreed to services, and gave verbal consent prior to initiation of services.  Please see initial visit note for detailed documentation.   Patient Care Team: [name], DO as PCP - General (Family Medicine) Porche Steinberger, Virl Diamond, RPH-CPP as Pharmacist   Recent consult visits: Office Visit with [facility] on 7/27  Hospital visits: Patient admitted to [facility] from 5/12 to 6/1 for sepsis with acute renal failure and septic shock related to abscess   Objective:  Lab Results  Component Value Date   CREATININE 1.08 12/08/2020   CREATININE 1.32 (H) 10/30/2020   CREATININE 1.85 (H) 10/15/2020       Component Value Date/Time   CHOL 105 07/21/2020 1108   TRIG 226 (H) 09/28/2020 0418   HDL 33 (L) 07/21/2020 1108   CHOLHDL 3.2 07/21/2020 1108   VLDL 51 (H) 11/24/2016 0818   LDLCALC 45 07/21/2020 1108    Hepatic Function Latest Ref Rng & Units 10/30/2020 10/09/2020 10/08/2020  Total Protein 6.5 - 8.1 g/dL 8.3(H) - -  Albumin 3.5 - 5.0 g/dL 4.0 2.4(L) 2.3(L)  AST 15 - 41 U/L 25 - -  ALT 0 - 44 U/L 28 - -  Alk Phosphatase 38 - 126 U/L 103 - -  Total Bilirubin 0.3 - 1.2 mg/dL 0.9 - -  Bilirubin, Direct 0.0 - 0.2 mg/dL - - -    Social History   Tobacco Use  Smoking Status Never   Smokeless Tobacco Never  Tobacco Comments   once a while had one cigar >10 years ago   BP Readings from Last 3 Encounters:  12/08/20 138/82  11/03/20 (!) 146/87  10/31/20 (!) 150/93   Pulse Readings from Last 3 Encounters:  12/08/20 69  11/03/20 76  10/31/20 78   Wt Readings from Last 3 Encounters:  12/08/20 232 lb 4 oz (105.3 kg)  11/03/20 220 lb (99.8 kg)  10/31/20 220 lb (99.8 kg)    Assessment: Review of patient past medical history, allergies, medications, health status, including review of consultants reports, laboratory and other test data, was performed as part of comprehensive evaluation and provision of chronic care management services.   SDOH:  (Social Determinants of Health) assessments and interventions performed: none   CCM Care Plan  Allergies  Allergen Reactions   Cyclobenzaprine Other (See Comments)    Not improving pain and makes him agressive.   Lisinopril Cough    Medications Reviewed Today     Reviewed by Vella Raring, RPH-CPP (Pharmacist) on 12/10/20 at 1010  Med List Status: <None>   Medication Order Taking? Sig Documenting Provider Last Dose Status Informant  amLODipine (NORVASC) 10 MG tablet [id number] Yes TAKE 1 TABLET DAILY [name], DO Taking Active   ELIQUIS 5 MG TABS tablet [id number] Yes TAKE 1 TABLET TWICE A DAY Karamalegos, Devonne Doughty, DO Taking Active  metoprolol tartrate (LOPRESSOR) 25 MG tablet [id number] Yes Take 1 tablet (25 mg total) by mouth 2 (two) times daily. Kathie Dike, MD Taking Active   OZEMPIC, 0.25 OR 0.5 MG/DOSE, 2 MG/1.5ML SOPN [id number] Yes Inject 1 mg into the skin once a week.  Patient taking differently: Inject 1 mg into the skin every Thursday.   [name], DO Taking Active            Med Note Kellie Simmering, Jenene Slicker   Fri Aug 15, 2020  2:42 PM)    pravastatin (PRAVACHOL) 20 MG tablet [id number]  Take 1 tablet (20 mg total) by mouth daily. [name], DO  Active Self             Patient Active Problem List   Diagnosis Date Noted   Pressure injury of skin 10/13/2020   Protein-calorie malnutrition, severe 10/10/2020   Postprocedural intraabdominal abscess    Acute respiratory failure ([place]) 09/28/2020   Encounter for nasogastric (NG) tube placement    Altered mental status    Abscess    Acute kidney failure, unspecified ([place])    Sepsis with acute renal failure and septic shock ([place]) 09/25/2020   S/P laparoscopic cholecystectomy 84/66/5993   Metabolic syndrome 57/05/7791   Atrial fibrillation ([place]) 07/09/2020   Class 1 obesity due to excess calories with serious comorbidity and body mass index (BMI) of 33.0 to 33.9 in adult 07/06/2019   Malignant neoplasm of urinary bladder ([place]) 12/06/2018   Overweight (BMI 25.0-29.9) 07/04/2018   Hyperlipidemia associated with type 2 diabetes mellitus ([place]) 11/30/2016   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   Gross hematuria 11/16/2016   Erectile dysfunction associated with type 2 diabetes mellitus ([place]) 05/26/2016   Diabetes mellitus with coincident hypertension ([place]) 06/11/2015   Type 2 diabetes mellitus with diabetic polyneuropathy, without long-term current use of insulin ([place]) 06/11/2015    Immunization History  Administered Date(s) Administered   Moderna Sars-Covid-2 Vaccination 11/30/2019, 12/28/2019   Pneumococcal Conjugate-13 05/30/2020    Conditions to be addressed/monitored: T2DM, HLD, atrial fibrillation  Care Plan : PharmD - Medication Assistance  Updates made by Vella Raring, RPH-CPP since 12/12/2020 12:00 AM     Problem: Disease Progression      Long-Range Goal: Disease Progression Prevented or Minimized   Start Date: 06/06/2020  Expected End Date: 09/04/2020  This Visit's Progress: On track  Recent Progress: On track  Priority: High  Note:   Current Barriers:  Difficulty with independently affording treatment regimen. Reports cost of Ozempic is difficult to afford, particularly  when enters coverage gap of [name] Patient APPROVED for patient assistance from [place] through 05/16/2021  Pharmacist Clinical Goal(s):  Over the next 90 days, patient will verbalize ability to afford treatment regimen through collaboration with PharmD and provider.   Interventions: 1:1 collaboration with [name], DO regarding development and update of comprehensive plan of care as evidenced by provider attestation and co-signature Inter-disciplinary care team collaboration (see longitudinal plan of care) Perform chart review. Patient seen for Office Visit with [facility] on 7/27. Provider advised patient to: Start valsartan 356m daily Stop amlodipine 162m Return in 2 weeks for blood pressure and labs.  Monitor home blood pressure values after sitting for 5 minutes with back and arm support. Keep a log. Bring log and blood pressure cuff to next visit. Receive voicemail from patient today requesting a call back. Return call to patient.   Hypertension/ Atrial Fibrillation Current treatment: Eliquis 5  mg twice daily Metoprolol 25 mg twice daily Amlodipine 10 mg daily (stopping as directed by Nehprology) Reports he has not yet started taking valsartan because he forgot and already took amlodipine today. Reiterate instructions above from Nephrologist. Patient verbalizes understanding via teachback that starting tomorrow he will Start valsartan 319m daily STOP amlodipine 134m(confirms has put this bottle away now) Return to Nephrologist 2 weeks for blood pressure and labs (reports has scheduled this appointment) Monitor home blood pressure as directed, keep a log and bring both log and blood pressure cuff to next visit   Hyperlipidemia:  Controlled; current treatment: pravastatin 20 mg daily Encourage patient to order refill of pravastatin  Medication Adherence: Have encouraged patient to start using weekly pillbox as  adherence tool  Medication Assistance: Have counseled patient on Eliquis patient assistance program, including income and annual out of pocket expense requirements Note patient has previously advised that he has applied for Extra Help subsidy, but does not qualify based on assets Patient reports cost of Eliquis is affordable for now, but may contact CM Pharmacist when close to/has met out of pocket requirement for 2022 calendar year Note patient currently purchasing diabetes testing supplies OTC. Have counseled on option for receiving these supplies through health plan with Rx for cost savings. Patient to let CM Pharmacist know if interested in the future  Patient Goals/Self-Care Activities Over the next 90 days, patient will:  - check glucose, document, and provide at future appointments - check blood pressure, document, and provide at future appointments - collaborate with provider on medication access solutions - attend medical appointments as scheduled Next appointment with Cardiology on 10/25     Follow Up Plan:  Telephone follow up appointment with care management team member scheduled for: 03/11/2021 at 10:00 AM      Patient's preferred pharmacy is:  [address]one: [phone]: [address]hone: [phone] Fax: [phone] [address] [address]hone: [phone]: [phone] Follow Up:  Patient agrees to Care Plan and Follow-up.  ElWallace CullensPharmD, BCPara MarchCPP Clinical Pharmacist [phone]

## 2020-12-15 ENCOUNTER — Telehealth: Payer: Self-pay | Admitting: *Deleted

## 2020-12-15 NOTE — Telephone Encounter (Signed)
-----   Message from Kavin Leech, RN sent at 12/08/2020 12:32 PM EDT ----- Regarding: Hassell Halim given in office  Diagnosis: Sleep disorder breathing  Ordered by Marrianne Mood, PA

## 2020-12-15 NOTE — Telephone Encounter (Signed)
Staff message sent to [facility] @ Dr Visser's office per [facility] no PA is required for sleep studies. Ok to have patient to activate .

## 2020-12-24 DIAGNOSIS — N179 Acute kidney failure, unspecified: Secondary | ICD-10-CM | POA: Diagnosis not present

## 2020-12-25 ENCOUNTER — Other Ambulatory Visit: Payer: Self-pay

## 2020-12-25 ENCOUNTER — Other Ambulatory Visit: Payer: Self-pay | Admitting: Family Medicine

## 2020-12-25 DIAGNOSIS — I480 Paroxysmal atrial fibrillation: Secondary | ICD-10-CM

## 2020-12-25 MED ORDER — APIXABAN 5 MG PO TABS: 5.0000 mg | ORAL_TABLET | Freq: Two times a day (BID) | ORAL | 2 refills | Status: DC

## 2021-01-20 ENCOUNTER — Other Ambulatory Visit: Payer: Self-pay | Admitting: Family Medicine

## 2021-01-20 DIAGNOSIS — I480 Paroxysmal atrial fibrillation: Secondary | ICD-10-CM

## 2021-01-20 DIAGNOSIS — I10 Essential (primary) hypertension: Secondary | ICD-10-CM

## 2021-01-20 NOTE — Telephone Encounter (Signed)
Mickel Baas from [facility], called about medication metoprolol tartrate (LOPRESSOR) 25 MG tablet says the directions state take 1/2 pill twice a day instead of the 1 tablet twice a day, keeds clarification or new prescription if changed from previous.

## 2021-01-20 NOTE — Telephone Encounter (Signed)
Requested medication (s) are due for refill today: Yes  Requested medication (s) are on the active medication list: Yes  Last refill:  3 months ago  Future visit scheduled: Yes  Notes to clinic:  Unable to refill per protocol, last refill by another provider. See below message from [place] from [facility], called about medication metoprolol tartrate (LOPRESSOR) 25 MG tablet says the directions state take 1/2 pill twice a day instead of the 1 tablet twice a day, keeds clarification or new prescription if changed from previous.      Requested Prescriptions  Pending Prescriptions Disp Refills   metoprolol tartrate (LOPRESSOR) 25 MG tablet      Sig: Take 1 tablet (25 mg total) by mouth 2 (two) times daily.     Cardiovascular:  Beta Blockers Passed - 01/20/2021  8:34 PM      Passed - Last BP in normal range    BP Readings from Last 1 Encounters:  12/08/20 138/82          Passed - Last Heart Rate in normal range    Pulse Readings from Last 1 Encounters:  12/08/20 69          Passed - Valid encounter within last 6 months    Recent Outpatient Visits           2 months ago Intra-abdominal abscess post-procedure   [place], DO   5 months ago Pre-op evaluation   [place], DO   6 months ago Acute cholecystitis   [place], DO   6 months ago Generalized abdominal pain   [place], DO   7 months ago Type 2 diabetes mellitus with diabetic polyneuropathy, without long-term current use of insulin [facility])   [facility] [place] Ranger, Devonne Doughty, DO       Future Appointments             In 1 month Mickle Plumb, Jacquelyn D, PA-C [place], [place]

## 2021-01-21 MED ORDER — METOPROLOL TARTRATE 25 MG PO TABS
25.0000 mg | ORAL_TABLET | Freq: Two times a day (BID) | ORAL | 1 refills | Status: DC
Start: 2021-01-21 — End: 2021-08-10

## 2021-01-30 ENCOUNTER — Telehealth: Payer: Self-pay

## 2021-01-30 NOTE — Telephone Encounter (Signed)
I called and notified the patient that his Ozempic came in to the office from the patient assistance program and is available for pick up.

## 2021-02-03 ENCOUNTER — Telehealth: Payer: Self-pay | Admitting: Family Medicine

## 2021-02-03 DIAGNOSIS — E1169 Type 2 diabetes mellitus with other specified complication: Secondary | ICD-10-CM

## 2021-02-03 DIAGNOSIS — E785 Hyperlipidemia, unspecified: Secondary | ICD-10-CM

## 2021-02-03 NOTE — Telephone Encounter (Signed)
Pt called and wants to know if he needs to continue taking this medication; if so he is requesting a refill. He is leaving town tomorrow, his pharmacist says it will be okay ot miss a few days of this Rx.   Also many compliments to his PCP. Very thankful for his care at [facility].

## 2021-02-04 NOTE — Telephone Encounter (Signed)
Patient is aware 

## 2021-02-04 NOTE — Telephone Encounter (Signed)
Thanks.  Yes keep taking pravastatin. Looks like order was sent. Thanks  Nobie Putnam, DO [facility] 02/04/2021, 1:12 PM

## 2021-03-03 ENCOUNTER — Encounter: Payer: Self-pay | Admitting: General Surgery

## 2021-03-06 ENCOUNTER — Ambulatory Visit (INDEPENDENT_AMBULATORY_CARE_PROVIDER_SITE_OTHER): Payer: Medicare Other | Admitting: Pharmacist

## 2021-03-06 DIAGNOSIS — E1169 Type 2 diabetes mellitus with other specified complication: Secondary | ICD-10-CM

## 2021-03-06 DIAGNOSIS — I480 Paroxysmal atrial fibrillation: Secondary | ICD-10-CM

## 2021-03-06 DIAGNOSIS — E1142 Type 2 diabetes mellitus with diabetic polyneuropathy: Secondary | ICD-10-CM

## 2021-03-06 NOTE — Patient Instructions (Signed)
Visit Information  PATIENT GOALS:  Goals Addressed             This Visit's Progress    Pharmacy Goals       Our goal bad cholesterol, or LDL, is less than 70 . This is why it is important to continue taking your pravastatin  Please check your home blood pressure, keep a log of the results and bring this with you to your medical appointments.  Feel free to call me with any questions or concerns. I look forward to our next call!    Wallace Cullens, PharmD, [place] [phone]        The patient verbalized understanding of instructions, educational materials, and care plan provided today and declined offer to receive copy of patient instructions, educational materials, and care plan.   Telephone follow up appointment with care management team member scheduled for: 11/28 at 11:15 am

## 2021-03-06 NOTE — Chronic Care Management (AMB) (Signed)
Chronic Care Management Pharmacy Note  03/06/2021 Name:  Richard Oconnell MRN:  [id number] DOB:  08/03/1955   Subjective: Richard Oconnell is an 65 y.o. year old male who is a primary patient of Richard Hauser, DO.  The CCM team was consulted for assistance with disease management and care coordination needs.    Receive message from Richard Oconnell with [facility] [place] advising patient called requesting call back from [facility] Pharmacist. Return call to patient.  Engaged with patient by telephone for follow up visit in response to provider referral for pharmacy case management and/or care coordination services.   Consent to Services:  The patient was given information about Chronic Care Management services, agreed to services, and gave verbal consent prior to initiation of services.  Please see initial visit note for detailed documentation.   Patient Care Team: Richard Hauser, DO as PCP - General (Family Medicine) Curley Spice Virl Diamond, RPH-CPP as Pharmacist  Objective:  Lab Results  Component Value Date   CREATININE 1.08 12/08/2020   CREATININE 1.32 (H) 10/30/2020   CREATININE 1.85 (H) 10/15/2020    Lab Results  Component Value Date   HGBA1C 6.7 (H) 09/26/2020   Last diabetic Eye exam: No results found for: HMDIABEYEEXA  Last diabetic Foot exam: No results found for: HMDIABFOOTEX      Component Value Date/Time   CHOL 105 07/21/2020 1108   TRIG 226 (H) 09/28/2020 0418   HDL 33 (L) 07/21/2020 1108   CHOLHDL 3.2 07/21/2020 1108   VLDL 51 (H) 11/24/2016 0818   LDLCALC 45 07/21/2020 1108    Hepatic Function Latest Ref Rng & Units 10/30/2020 10/09/2020 10/08/2020  Total Protein 6.5 - 8.1 g/dL 8.3(H) - -  Albumin 3.5 - 5.0 g/dL 4.0 2.4(L) 2.3(L)  AST 15 - 41 U/L 25 - -  ALT 0 - 44 U/L 28 - -  Alk Phosphatase 38 - 126 U/L 103 - -  Total Bilirubin 0.3 - 1.2 mg/dL 0.9 - -  Bilirubin, Direct 0.0 - 0.2 mg/dL - - -    Social History   Tobacco  Use  Smoking Status Never  Smokeless Tobacco Never  Tobacco Comments   once a while had one cigar >10 years ago   BP Readings from Last 3 Encounters:  12/08/20 138/82  11/03/20 (!) 146/87  10/31/20 (!) 150/93   Pulse Readings from Last 3 Encounters:  12/08/20 69  11/03/20 76  10/31/20 78   Wt Readings from Last 3 Encounters:  12/08/20 232 lb 4 oz (105.3 kg)  11/03/20 220 lb (99.8 kg)  10/31/20 220 lb (99.8 kg)    Assessment: Review of patient past medical history, allergies, medications, health status, including review of consultants reports, laboratory and other test data, was performed as part of comprehensive evaluation and provision of chronic care management services.   SDOH:  (Social Determinants of Health) assessments and interventions performed:    CCM Care Plan  Allergies  Allergen Reactions   Cyclobenzaprine Other (See Comments)    Not improving pain and makes him agressive.   Lisinopril Cough    Medications Reviewed Today     Reviewed by Rennis Petty, RPH-CPP (Pharmacist) on 03/06/21 at 1542  Med List Status: <None>   Medication Order Taking? Sig Documenting Provider Last Dose Status Informant  apixaban (ELIQUIS) 5 MG TABS tablet [id number] Yes Take 1 tablet (5 mg total) by mouth 2 (two) times daily. Richard Hauser, DO Taking Active   metoprolol tartrate (LOPRESSOR)  25 MG tablet [id number] Yes Take 1 tablet (25 mg total) by mouth 2 (two) times daily. Richard Hauser, DO Taking Active   OZEMPIC, 0.25 OR 0.5 MG/DOSE, 2 MG/1.5ML SOPN [id number] Yes Inject 1 mg into the skin once a week.  Patient taking differently: No sig reported   Richard Hauser, DO Taking Active            Med Note Richard Oconnell, Richard Oconnell   Fri Aug 15, 2020  2:42 PM)    pravastatin (PRAVACHOL) 20 MG tablet [id number] Yes Take 1 tablet (20 mg total) by mouth daily. Richard Hauser, DO Taking Active   valsartan (DIOVAN) 320 MG tablet [id number] Yes Take 320 mg  by mouth daily. [provider] Taking Active             Patient Active Problem List   Diagnosis Date Noted   Pressure injury of skin 10/13/2020   Protein-calorie malnutrition, severe 10/10/2020   Postprocedural intraabdominal abscess    Acute respiratory failure ([place]) 09/28/2020   Encounter for nasogastric (NG) tube placement    Altered mental status    Abscess    Acute kidney failure, unspecified ([place])    Sepsis with acute renal failure and septic shock ([place] [address]) 09/25/2020   S/P laparoscopic cholecystectomy 53/97/6734   Metabolic syndrome 19/37/9024   Atrial fibrillation ([place]) 07/09/2020   Class 1 obesity due to excess calories with serious comorbidity and body mass index (BMI) of 33.0 to 33.9 in adult 07/06/2019   Malignant neoplasm of urinary bladder ([place]) 12/06/2018   Overweight (BMI 25.0-29.9) 07/04/2018   Hyperlipidemia associated with type 2 diabetes mellitus ([place]) 11/30/2016   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   Gross hematuria 11/16/2016   Erectile dysfunction associated with type 2 diabetes mellitus ([place]) 05/26/2016   Diabetes mellitus with coincident hypertension ([place]) 06/11/2015   Type 2 diabetes mellitus with diabetic polyneuropathy, without long-term current use of insulin ([place]) 06/11/2015    Immunization History  Administered Date(s) Administered   Moderna Sars-Covid-2 Vaccination 11/30/2019, 12/28/2019   Pneumococcal Conjugate-13 05/30/2020    Conditions to be addressed/monitored: T2DM, HLD, atrial fibrillation  Care Plan : PharmD - Medication Assistance  Updates made by Rennis Petty, RPH-CPP since 03/06/2021 12:00 AM     Problem: Disease Progression      Long-Range Goal: Disease Progression Prevented or Minimized   Start Date: 06/06/2020  Expected End Date: 09/04/2020  This Visit's Progress: On track  Recent Progress: On track  Priority: High  Note:   Current Barriers:  Difficulty with independently affording treatment regimen.  Reports cost of Ozempic is difficult to afford, particularly when enters coverage gap of [name] Patient APPROVED for patient assistance from [address] through 05/16/2021  Pharmacist Clinical Goal(s):  Over the next 90 days, patient will verbalize ability to afford treatment regimen through collaboration with PharmD and provider.   Interventions: 1:1 collaboration with Richard Hauser, DO regarding development and update of comprehensive plan of care as evidenced by provider attestation and co-signature Inter-disciplinary care team collaboration (see longitudinal plan of care) Receive a call from Richard Oconnell with Patient [place] advising patient called requesting call back from [facility] Pharmacist. Return call to patient.  Medication Assistance: Patient interested in re-enrollment in [name] patient assistance program for 2023 calendar year Reports currently has at least 2 boxes of Ozempic remaining Will collaborate with Ashley Simcox for support to patient with re-enrollment in [name] patient assistance program  Type 2  Diabetes: Current treatment: Ozempic 59m weekly Reports recent fasting morning blood sugars ranging: 103-134   Hypertension/ Atrial Fibrillation Current treatment: Eliquis 5 mg twice daily Metoprolol 25 mg twice daily valsartan 3240mdaily Unable to review specific readings as patient not home today, but reports recent home BP readings ranging: 120-142/70-80 Encourage patient to continue to monitor home BP and HR, keep log of results and to bring this record to upcoming appointment with Cardiology   Hyperlipidemia:  Controlled; current treatment: pravastatin 20 mg daily Encourage patient to order refill of pravastatin  Medication Adherence: Assess adherence to pravastatin 20 mg daily (as identified by health plan).  Patient reports picked up pravastatin Rx as renewed by PCP on 02/03/2021 and taking as  directed Review dispensing history in chart. Note pravastatin Rx last refilled by pharmacy on 9/20 for 90 day supply Encourage patient to continue adherence to medication and have encouraged patient to start using weekly pillbox as adherence tool   Patient Goals/Self-Care Activities Over the next 90 days, patient will:  - check glucose, document, and provide at future appointments - check blood pressure, document, and provide at future appointments - collaborate with provider on medication access solutions - attend medical appointments as scheduled Next appointment with Cardiology on 10/25   Follow Up Plan:  Telephone follow up appointment with care management team member scheduled for: 11/28 at 11:15 am     Patient's preferred pharmacy is:  [place] [address]: [phone]: [address]one: [phone] Fax: [id number] [place] [facility] [address]one: [phone]: [phone] Follow Up:  Patient agrees to Care Plan and Follow-up.  ElWallace CullensPharmD, BCPara MarchCPP Clinical Pharmacist [phone]

## 2021-03-10 ENCOUNTER — Other Ambulatory Visit: Payer: Self-pay

## 2021-03-10 ENCOUNTER — Ambulatory Visit: Payer: Medicare Other | Admitting: Physician Assistant

## 2021-03-10 ENCOUNTER — Telehealth: Payer: Self-pay | Admitting: Pharmacy Technician

## 2021-03-10 ENCOUNTER — Encounter: Payer: Self-pay | Admitting: Physician Assistant

## 2021-03-10 VITALS — BP 122/80 | HR 75 | Ht 76.0 in | Wt 267.1 lb

## 2021-03-10 DIAGNOSIS — N170 Acute kidney failure with tubular necrosis: Secondary | ICD-10-CM

## 2021-03-10 DIAGNOSIS — G4730 Sleep apnea, unspecified: Secondary | ICD-10-CM | POA: Diagnosis not present

## 2021-03-10 DIAGNOSIS — I10 Essential (primary) hypertension: Secondary | ICD-10-CM | POA: Diagnosis not present

## 2021-03-10 DIAGNOSIS — Z79899 Other long term (current) drug therapy: Secondary | ICD-10-CM | POA: Diagnosis not present

## 2021-03-10 DIAGNOSIS — I480 Paroxysmal atrial fibrillation: Secondary | ICD-10-CM | POA: Diagnosis not present

## 2021-03-10 DIAGNOSIS — Z596 Low income: Secondary | ICD-10-CM

## 2021-03-10 NOTE — Patient Instructions (Signed)
Medication Instructions:  No changes at this time.  *If you need a refill on your cardiac medications before your next appointment, please call your pharmacy*   Lab Work: CBC, BMET today   If you have labs (blood work) drawn today and your tests are completely normal, you will receive your results only by: [place] (if you have MyChart) OR A paper copy in the mail If you have any lab test that is abnormal or we need to change your treatment, we will call you to review the results.   Testing/Procedures: None   Follow-Up: At [facility], you and your health needs are our priority.  As part of our continuing mission to provide you with exceptional heart care, we have created designated Provider Care Teams.  These Care Teams include your primary Cardiologist (physician) and Advanced Practice Providers (APPs -  Physician Assistants and Nurse Practitioners) who all work together to provide you with the care you need, when you need it.   Your next appointment:   6 month(s)  The format for your next appointment:   In Person  Provider:   You may see Dr. Harrell Gave End or one of the following Advanced Practice Providers on your designated Care Team:   Murray Hodgkins, NP Christell Faith, PA-C Marrianne Mood, PA-C Cadence Benson, [place]

## 2021-03-10 NOTE — Progress Notes (Signed)
Office Visit    Patient Name: Richard Oconnell Date of Encounter: 03/10/2021  PCP:  [name], [place]  Cardiologist:  Dr. Rockey Situ Advanced Practice Provider:  No care team member to display Electrophysiologist:  None   [id number]   Chief Complaint    Chief Complaint  Patient presents with   Other    3 month f/u no complaints today. Meds reviewed verbally with pt.    65 y.o. male with a hx of HTN, DM2, bladder CA, previous admission with acute cholecystitis s/p cholecystotomy tube 2/24 with follow-up robotic assisted cholecystectomy 4/25, and who is being seen today for the evaluation of atrial fibrillation when high risk for [facility] and in the setting of septic shock and bacteremia with large abdominal abscess and multiorgan failure wcomplicated by severe metabolic acidosis and toxic metabolic encephalopathy and here today for follow-up.  Past Medical History    Past Medical History:  Diagnosis Date   Cancer [facility])    Bladder Cancer   Erectile dysfunction associated with type 2 diabetes mellitus ([place]) 05/26/2016   Gross hematuria 11/16/2016   Hypertension 06/11/2015   Type 2 diabetes mellitus with diabetic polyneuropathy, without long-term current use of insulin ([place]) 06/11/2015   Past Surgical History:  Procedure Laterality Date   boil lanceted     hand   COLONOSCOPY WITH PROPOFOL N/A 07/03/2020   Procedure: COLONOSCOPY WITH PROPOFOL;  Surgeon: Jonathon Bellows, MD;  Location: [facility] ENDOSCOPY;  Service: Gastroenterology;  Laterality: N/A;   CYSTOSCOPY W/ RETROGRADES Bilateral 12/06/2016   Procedure: CYSTOSCOPY WITH RETROGRADE PYELOGRAM;  Surgeon: Hollice Espy, MD;  Location: [facility] ORS;  Service: Urology;  Laterality: Bilateral;   CYSTOSCOPY W/ RETROGRADES Bilateral 06/27/2017   Procedure: CYSTOSCOPY WITH RETROGRADE PYELOGRAM;  Surgeon: Hollice Espy, MD;  Location: [facility] ORS;  Service: Urology;  Laterality: Bilateral;   CYSTOSCOPY WITH  BIOPSY N/A 06/27/2017   Procedure: CYSTOSCOPY WITH Bladder BIOPSY;  Surgeon: Hollice Espy, MD;  Location: [facility] ORS;  Service: Urology;  Laterality: N/A;   CYSTOSCOPY WITH BIOPSY N/A 10/24/2017   Procedure: CYSTOSCOPY WITH Bladder BIOPSY;  Surgeon: Hollice Espy, MD;  Location: [facility] ORS;  Service: Urology;  Laterality: N/A;   ingrown  Bilateral    ingrown toenail   IR BILIARY DRAIN PLACEMENT WITH CHOLANGIOGRAM  07/10/2020   IR CHOLANGIOGRAM EXISTING TUBE  08/07/2020   IR RADIOLOGIST EVAL & MGMT  08/07/2020   IR RADIOLOGIST EVAL & MGMT  10/29/2020   IR RADIOLOGIST EVAL & MGMT  11/12/2020   TRANSURETHRAL RESECTION OF BLADDER TUMOR N/A 12/06/2016   Procedure: TRANSURETHRAL RESECTION OF BLADDER TUMOR (TURBT) (2-5cm) CLOT EVACUATION;  Surgeon: Hollice Espy, MD;  Location: [facility] ORS;  Service: Urology;  Laterality: N/A;   TRANSURETHRAL RESECTION OF BLADDER TUMOR N/A 06/27/2017   Procedure: TRANSURETHRAL RESECTION OF BLADDER TUMOR (TURBT);  Surgeon: Hollice Espy, MD;  Location: [facility] ORS;  Service: Urology;  Laterality: N/A;   TRANSURETHRAL RESECTION OF BLADDER TUMOR WITH MITOMYCIN-C N/A 01/18/2017   Procedure: TRANSURETHRAL RESECTION OF BLADDER TUMOR WITH MITOMYCIN-C-(SMALL);  Surgeon: Hollice Espy, MD;  Location: [facility] ORS;  Service: Urology;  Laterality: N/A;    Allergies  Allergies  Allergen Reactions   Cyclobenzaprine Other (See Comments)    Not improving pain and makes him agressive.   Lisinopril Cough    History of Present Illness    Richard Oconnell is a 65 y.o. male with PMH as above. Richard Oconnell is a 65 yo male with PMH  as above and no previously known history of arrhythmia or heart dz.   He presented to [facility] 09/25/2020 with large abdominal abscess and severe septic shock with bacteremia and multiorgan failure, complicated by severe metabolic acidosis and toxic metabolic encephalopathy. Per family, the patient became disoriented and then skin appeared jaundiced, leading him to  present to [facility].   He was admitted to [facility] with bacteremia/septic shock related to an abdominal abscess, as well as acute renal failure with ATN, requiring temporary hemodialysis.  Cardiology consulted for possible PAF with [place] not recommended in the setting of high risk for bleeding with liver dysfunction and large abscess. Amiodarone was noted as reportedly discontinued in the setting of junctional rhythm. On review of previous EKGs at time of consultation, the team was able to find evidence of atrial fibrillation from 07/09/2020, though on review of prior to admission medications, he had not been maintained on anticoagulation since Afib EKG on February 2022. He was initially started on amiodarone for his atrial fibrillation; however, this was discontinued due to bradycardia/junctional rhythm.  He was initially not started on any anticoagulation due to his comorbidities at the time of admission and including sepsis, abdominal abscess with drainage, and elevated LFTs.  By discharge, he was anticoagulated with Eliquis.  Due to elevated BP, it was noted that he should start on ACE/ARB if renal function would allow at RTC.  09/27/2020 echo showed EF 60 to 65%, NR WMA, G2 DD, RVSP 21.5 mmHg, and no valve vegetation.  Seen 11/2020 and doing well.  He was screened for sleep apnea and while he did score an adequate amount of points for WatchPat, this was then canceled given he does not have a smart phone.  He was started on valsartan for additional BP support.  Today, 03/10/2021, he returns to clinic and is doing well from a cardiac standpoint.  He has tolerated the valsartan well.  He denies any chest pain or shortness of breath.  No symptoms of volume overload.  No evidence of bleeding.  He reports that he remains active.  He believes he had COVID-19 2 years ago.  He is very grateful for his improvement in symptoms.  He reports his BP is usually well controlled at home with 1 outlier BP of 153/90.  He reports  medication compliance.  He recently hit his L shin on something  but otherwise has been doing well.  Home Medications   Current Outpatient Medications  Medication Instructions   apixaban (ELIQUIS) 5 mg, Oral, 2 times daily   metoprolol tartrate (LOPRESSOR) 25 mg, Oral, 2 times daily   Ozempic (0.25 or 0.5 MG/DOSE) 1 mg, Subcutaneous, Weekly   pravastatin (PRAVACHOL) 20 mg, Oral, Daily   valsartan (DIOVAN) 320 mg, Oral, Daily     Review of Systems    He denies chest pain, palpitations, dyspnea, pnd, orthopnea, n, v, dizziness, syncope, edema, weight gain, or early satiety.   All other systems reviewed and are otherwise negative except as noted above.  Physical Exam    VS:  BP 122/80 (BP Location: Left Arm, Patient Position: Sitting, Cuff Size: Normal)   Pulse 75   Ht 6\' 4"  (1.93 m)   Wt 267 lb 2 oz (121.2 kg)   SpO2 96%   BMI 32.52 kg/m  , BMI Body mass index is 32.52 kg/m. GEN: Well nourished, well developed, in no acute distress. HEENT: normal. Neck: Supple, no JVD, carotid bruits, or masses. Cardiac: RRR, no murmurs, rubs, or gallops. No clubbing, cyanosis,  mild bilateral edema with L>R (recently hit his L shin).  Radials/DP/PT 2+ and equal bilaterally.  Respiratory:  Respirations regular and unlabored, clear to auscultation bilaterally. GI: Soft, nontender, nondistended, BS + x 4. MS: no deformity or atrophy. Skin: warm and dry, no rash. Neuro:  Strength and sensation are intact. Psych: Normal affect.  Accessory Clinical Findings    ECG personally reviewed by me today - SR with first degree AVB, 75bpm, LAD/IVCD - no acute changes.  VITALS Reviewed today   Temp Readings from Last 3 Encounters:  11/03/20 98 F (36.7 C) (Oral)  10/15/20 98 F (36.7 C) (Oral)  09/16/20 98.3 F (36.8 C) (Oral)   BP Readings from Last 3 Encounters:  03/10/21 122/80  12/08/20 138/82  11/03/20 (!) 146/87   Pulse Readings from Last 3 Encounters:  03/10/21 75  12/08/20 69   11/03/20 76    Wt Readings from Last 3 Encounters:  03/10/21 267 lb 2 oz (121.2 kg)  12/08/20 232 lb 4 oz (105.3 kg)  11/03/20 220 lb (99.8 kg)     LABS  reviewed today   Lab Results  Component Value Date   WBC 8.8 12/08/2020   HGB 13.7 12/08/2020   HCT 41.8 12/08/2020   MCV 87 12/08/2020   PLT 306 12/08/2020   Lab Results  Component Value Date   CREATININE 1.08 12/08/2020   BUN 24 12/08/2020   NA 138 12/08/2020   K 4.8 12/08/2020   CL 100 12/08/2020   CO2 22 12/08/2020   Lab Results  Component Value Date   ALT 28 10/30/2020   AST 25 10/30/2020   ALKPHOS 103 10/30/2020   BILITOT 0.9 10/30/2020   Lab Results  Component Value Date   CHOL 105 07/21/2020   HDL 33 (L) 07/21/2020   LDLCALC 45 07/21/2020   TRIG 226 (H) 09/28/2020   CHOLHDL 3.2 07/21/2020    Lab Results  Component Value Date   HGBA1C 6.7 (H) 09/26/2020   Lab Results  Component Value Date   TSH 0.160 (L) 07/11/2020     STUDIES/PROCEDURES reviewed today   Echo 09/27/20 . Left ventricular ejection fraction, by estimation, is 60 to 65%. The  left ventricle has normal function. The left ventricle has no regional  wall motion abnormalities. Left ventricular diastolic parameters are  consistent with Grade II diastolic  dysfunction (pseudonormalization).   2. Right ventricular systolic function is normal. The right ventricular  size is normal. There is normal pulmonary artery systolic pressure. The  estimated right ventricular systolic pressure is 86.7 mmHg.   3. The mitral valve is normal in structure. No evidence of mitral valve  regurgitation. No evidence of mitral stenosis.   4. No valve vegetation.   Assessment & Plan    Paroxysmal atrial fibrillation with RVR --Asymptomatic.  Denies any palpitations.  Continue current Lopressor.  Continue apixaban 5 mg twice daily without any reported signs and symptoms of bleeding.  CHA2DS2-VASc score of at least 3 (age, hypertension, DM2).  We will  recheck a BMET, CBC.   Essential hypertension --BP well controlled.  He is tolerating valsartan well.  Continue valsartan and metoprolol.  We will recheck a BMET.  Salt and fluid restrictions reviewed.  Possible sleep apnea /sleep disordered breathing - Stop bang completed under rooming tab.  While he qualifies for a watch pat, he does not have a smart phone and thus this study was discontinued.  Consider sleep study in the future, given untreated sleep apnea is often associated  with atrial fibrillation and elevated BP.  Disposition: RTC 6-12 mo  *Please be aware that the above documentation was completed voice recognition software and may contain dictation errors.      Arvil Chaco, PA-C 03/10/2021

## 2021-03-10 NOTE — Progress Notes (Signed)
[facility])                                            [place] Team    03/10/2021  Richard Oconnell January 09, 1956 [id number]  FOR 2023 RE ENROLLMENT                                      Medication Assistance Referral  Referral From: [facility] Embedded RPh Dorthula Perfect   Medication/Company: Larna Daughters / [employer] Patient application portion:  Education officer, museum portion: Faxed  to Dr. Parks Ranger Provider address/fax verified via: Office website   Naavya Postma P. Dimitri Shakespeare, [place]  [phone]

## 2021-03-11 ENCOUNTER — Telehealth: Payer: Self-pay

## 2021-03-11 LAB — CBC
Hematocrit: 44.6 % (ref 37.5–51.0)
Hemoglobin: 15.3 g/dL (ref 13.0–17.7)
MCH: 30.4 pg (ref 26.6–33.0)
MCHC: 34.3 g/dL (ref 31.5–35.7)
MCV: 89 fL (ref 79–97)
Platelets: 300 10*3/uL (ref 150–450)
RBC: 5.04 x10E6/uL (ref 4.14–5.80)
RDW: 13.4 % (ref 11.6–15.4)
WBC: 8.5 10*3/uL (ref 3.4–10.8)

## 2021-03-11 LAB — BASIC METABOLIC PANEL
BUN/Creatinine Ratio: 19 (ref 10–24)
BUN: 22 mg/dL (ref 8–27)
CO2: 21 mmol/L (ref 20–29)
Calcium: 10.2 mg/dL (ref 8.6–10.2)
Chloride: 103 mmol/L (ref 96–106)
Creatinine, Ser: 1.17 mg/dL (ref 0.76–1.27)
Glucose: 109 mg/dL — ABNORMAL HIGH (ref 70–99)
Potassium: 4.7 mmol/L (ref 3.5–5.2)
Sodium: 139 mmol/L (ref 134–144)
eGFR: 69 mL/min/{1.73_m2} (ref >59)

## 2021-03-12 ENCOUNTER — Encounter: Payer: Self-pay | Admitting: Physician Assistant

## 2021-03-16 DIAGNOSIS — N179 Acute kidney failure, unspecified: Secondary | ICD-10-CM | POA: Diagnosis not present

## 2021-03-16 DIAGNOSIS — I480 Paroxysmal atrial fibrillation: Secondary | ICD-10-CM

## 2021-03-16 DIAGNOSIS — E1142 Type 2 diabetes mellitus with diabetic polyneuropathy: Secondary | ICD-10-CM | POA: Diagnosis not present

## 2021-03-16 DIAGNOSIS — E1129 Type 2 diabetes mellitus with other diabetic kidney complication: Secondary | ICD-10-CM | POA: Diagnosis not present

## 2021-03-16 DIAGNOSIS — E1169 Type 2 diabetes mellitus with other specified complication: Secondary | ICD-10-CM | POA: Diagnosis not present

## 2021-03-16 DIAGNOSIS — E785 Hyperlipidemia, unspecified: Secondary | ICD-10-CM | POA: Diagnosis not present

## 2021-03-16 DIAGNOSIS — I10 Essential (primary) hypertension: Secondary | ICD-10-CM | POA: Diagnosis not present

## 2021-04-07 ENCOUNTER — Telehealth: Payer: Self-pay | Admitting: Pharmacy Technician

## 2021-04-07 DIAGNOSIS — Z596 Low income: Secondary | ICD-10-CM

## 2021-04-07 NOTE — Progress Notes (Signed)
[name] [facility])                                            [place] Team    04/07/2021  Richard Oconnell Nov 25, 1955 [id number]  Received both patient and provider portion(s) of patient assistance application(s) for Ozempic. Faxed completed application and required documents into [name].    [name] P. [name], [place]  [phone]

## 2021-04-13 ENCOUNTER — Ambulatory Visit (INDEPENDENT_AMBULATORY_CARE_PROVIDER_SITE_OTHER): Payer: Medicare Other | Admitting: Pharmacist

## 2021-04-13 DIAGNOSIS — E1169 Type 2 diabetes mellitus with other specified complication: Secondary | ICD-10-CM

## 2021-04-13 DIAGNOSIS — E1142 Type 2 diabetes mellitus with diabetic polyneuropathy: Secondary | ICD-10-CM

## 2021-04-13 DIAGNOSIS — I480 Paroxysmal atrial fibrillation: Secondary | ICD-10-CM

## 2021-04-13 DIAGNOSIS — I10 Essential (primary) hypertension: Secondary | ICD-10-CM

## 2021-04-13 NOTE — Patient Instructions (Signed)
Visit Information  Thank you for taking time to visit with me today. Please don't hesitate to contact me if I can be of assistance to you before our next scheduled telephone appointment.  Following are the goals we discussed today:   Our goal bad cholesterol, or LDL, is less than 70 . This is why it is important to continue taking your pravastatin  Please check your home blood pressure, keep a log of the results and bring this with you to your medical appointments.  Feel free to call me with any questions or concerns. I look forward to our next call!   Wallace Cullens, PharmD, [place] [phone]  Our next appointment is by telephone on 07/13/2021 at 1 pm  Please call the care guide team at [phone] if you need to cancel or reschedule your appointment.    The patient verbalized understanding of instructions, educational materials, and care plan provided today and declined offer to receive copy of patient instructions, educational materials, and care plan.

## 2021-04-13 NOTE — Chronic Care Management (AMB) (Signed)
Chronic Care Management CCM Pharmacy Note  04/13/2021 Name:  Richard Oconnell MRN:  [id number] DOB:  Dec 12, 1955   Subjective: Richard Oconnell is an 65 y.o. year old male who is a primary patient of [name], DO.  The CCM team was consulted for assistance with disease management and care coordination needs.    Engaged with patient by telephone for follow up visit for pharmacy case management and/or care coordination services.   Objective:  Medications Reviewed Today     Reviewed by Rennis Petty, RPH-CPP (Pharmacist) on 04/13/21 at 1222  Med List Status: <None>   Medication Order Taking? Sig Documenting Provider Last Dose Status Informant  apixaban (ELIQUIS) 5 MG TABS tablet [id number] Yes Take 1 tablet (5 mg total) by mouth 2 (two) times daily. [name], DO Taking Active   metoprolol tartrate (LOPRESSOR) 25 MG tablet [id number] Yes Take 1 tablet (25 mg total) by mouth 2 (two) times daily. [name], DO Taking Active   OZEMPIC, 0.25 OR 0.5 MG/DOSE, 2 MG/1.5ML SOPN [id number] Yes Inject 1 mg into the skin once a week.  Patient taking differently: Inject 1 mg into the skin every Thursday.   [name], DO Taking Active            Med Note Kellie Simmering, Jenene Slicker   Fri Aug 15, 2020  2:42 PM)    pravastatin (PRAVACHOL) 20 MG tablet [id number] Yes Take 1 tablet (20 mg total) by mouth daily. [name], DO Taking Active   valsartan (DIOVAN) 320 MG tablet [id number] Yes Take 320 mg by mouth daily. [provider] Taking Active             Pertinent Labs:  Lab Results  Component Value Date   HGBA1C 6.7 (H) 09/26/2020   Lab Results  Component Value Date   CHOL 105 07/21/2020   HDL 33 (L) 07/21/2020   LDLCALC 45 07/21/2020   TRIG 226 (H) 09/28/2020   CHOLHDL 3.2 07/21/2020   Lab Results  Component Value Date   CREATININE 1.17 03/10/2021   BUN 22 03/10/2021   NA 139 03/10/2021   K 4.7  03/10/2021   CL 103 03/10/2021   CO2 21 03/10/2021    SDOH:  (Social Determinants of Health) assessments and interventions performed:    CCM Care Plan  Review of patient past medical history, allergies, medications, health status, including review of consultants reports, laboratory and other test data, was performed as part of comprehensive evaluation and provision of chronic care management services.   Care Plan : PharmD - Medication Assistance  Updates made by Rennis Petty, RPH-CPP since 04/13/2021 12:00 AM     Problem: Disease Progression      Long-Range Goal: Disease Progression Prevented or Minimized   Start Date: 06/06/2020  Expected End Date: 09/04/2020  This Visit's Progress: On track  Recent Progress: On track  Priority: High  Note:   Current Barriers:  Difficulty with independently affording treatment regimen. Reports cost of Ozempic is difficult to afford, particularly when enters coverage gap of [name] Patient APPROVED for patient assistance from [place] through 05/16/2021  Pharmacist Clinical Goal(s):  Over the next 90 days, patient will verbalize ability to afford treatment regimen through collaboration with PharmD and provider.   Interventions: 1:1 collaboration with [name], DO regarding development and update of comprehensive plan of care as evidenced by provider attestation and co-signature Inter-disciplinary care team collaboration (see longitudinal  plan of care) Perform chart review Office Visit with [place] on 10/25 Office Visit with [employer].  Patient advised to monitor home BP, keep log and bring record to next appointment  Medication Assistance: Collaborating with [facility] CPhT Susy Frizzle for support to patient with re-enrollment in [name] patient assistance program for 2023 calendar year Per note from [place] on 11/22, she received patient and provider  portions of application and faxed both to [name] Patient reports has a sufficient supply of Ozempic to last through end of current calendar year  Type 2 Diabetes: Current treatment: Ozempic 1mg  weekly Reports last checked fasting morning blood sugar: 11/22: 124 Reports getting back to well-balanced meals and exercise routing now that he is back from road trip   Hypertension/ Atrial Fibrillation Current treatment: Eliquis 5 mg twice daily Metoprolol 25 mg twice daily valsartan 320mg  daily Reports recent home blood pressure readings: Today: 131/83, HR 83 11/24: 123/80, HR 81 Denies signs of hypotension and hypertension Counsel on blood pressure monitoring technique, particularly importance of resting prior to readings Encourage patient to continue to monitor home BP and HR, keep log of results and to bring this record to medical appointments   Hyperlipidemia:  Controlled; current treatment: pravastatin 20 mg daily  Medication Adherence: Encourage continued adherence to medication and have encouraged patient to start using weekly pillbox as adherence tool   Patient Goals/Self-Care Activities Over the next 90 days, patient will:  - check glucose, document, and provide at future appointments - check blood pressure, document, and provide at future appointments - collaborate with provider on medication access solutions - attend medical appointments as scheduled   Follow Up Plan:  Telephone follow up appointment with care management team member scheduled for: 07/13/2021 at 1 pm      Wallace Cullens, PharmD, [place], [place] [phone]

## 2021-04-15 DIAGNOSIS — E785 Hyperlipidemia, unspecified: Secondary | ICD-10-CM

## 2021-04-15 DIAGNOSIS — Z87891 Personal history of nicotine dependence: Secondary | ICD-10-CM

## 2021-04-15 DIAGNOSIS — E1169 Type 2 diabetes mellitus with other specified complication: Secondary | ICD-10-CM

## 2021-04-15 DIAGNOSIS — I480 Paroxysmal atrial fibrillation: Secondary | ICD-10-CM

## 2021-04-15 DIAGNOSIS — E1142 Type 2 diabetes mellitus with diabetic polyneuropathy: Secondary | ICD-10-CM

## 2021-04-15 DIAGNOSIS — Z7985 Long-term (current) use of injectable non-insulin antidiabetic drugs: Secondary | ICD-10-CM

## 2021-04-15 DIAGNOSIS — I10 Essential (primary) hypertension: Secondary | ICD-10-CM

## 2021-05-01 ENCOUNTER — Other Ambulatory Visit: Payer: Self-pay | Admitting: Family Medicine

## 2021-05-01 DIAGNOSIS — E1169 Type 2 diabetes mellitus with other specified complication: Secondary | ICD-10-CM

## 2021-05-01 DIAGNOSIS — E785 Hyperlipidemia, unspecified: Secondary | ICD-10-CM

## 2021-05-02 NOTE — Telephone Encounter (Signed)
Requested Prescriptions  Pending Prescriptions Disp Refills   pravastatin (PRAVACHOL) 20 MG tablet [Pharmacy Med Name: PRAVASTATIN SODIUM 20 MG TAB] 90 tablet 0    Sig: Take 1 tablet (20 mg total) by mouth daily.     Cardiovascular:  Antilipid - Statins Failed - 05/01/2021 12:27 PM      Failed - HDL in normal range and within 360 days    HDL  Date Value Ref Range Status  07/21/2020 33 (L) > OR = 40 mg/dL Final         Failed - Triglycerides in normal range and within 360 days    Triglycerides  Date Value Ref Range Status  09/28/2020 226 (H) <150 mg/dL Final    Comment:    Performed at [facility], [address] [place], [place] [address]         Passed - Total Cholesterol in normal range and within 360 days    Cholesterol  Date Value Ref Range Status  07/21/2020 105 <200 mg/dL Final         Passed - LDL in normal range and within 360 days    LDL Cholesterol (Calc)  Date Value Ref Range Status  07/21/2020 45 mg/dL (calc) Final    Comment:    Reference range: <100 . Desirable range <100 mg/dL for primary prevention;   <70 mg/dL for patients with CHD or diabetic patients  with > or = 2 CHD risk factors. Marland Kitchen LDL-C is now calculated using the Martin-Hopkins  calculation, which is a validated novel method providing  better accuracy than the Friedewald equation in the  estimation of LDL-C.  Cresenciano Genre et al. Annamaria Helling. 1884;166(06): 2061-2068  (http://education.[employer].com/faq/FAQ164)          Passed - Patient is not pregnant      Passed - Valid encounter within last 12 months    Recent Outpatient Visits          6 months ago Intra-abdominal abscess post-procedure   [facility] [name], DO   8 months ago Pre-op evaluation   [place], DO   9 months ago Acute cholecystitis   [place], DO   9 months ago Generalized abdominal pain    [place], DO   11 months ago Type 2 diabetes mellitus with diabetic polyneuropathy, without long-term current use of insulin [facility])   [facility] [place], Devonne Doughty, DO

## 2021-05-13 ENCOUNTER — Telehealth: Payer: Self-pay | Admitting: Pharmacy Technician

## 2021-05-13 DIAGNOSIS — Z596 Low income: Secondary | ICD-10-CM

## 2021-05-13 NOTE — Progress Notes (Signed)
[place] [facility])                                            [place] Team    05/13/2021  Gaetan Spieker 10/08/1955 [id number]  Care coordination call placed to [place] in regard to [employer] application.  Spoke to [place] who informs patient is APPROVED 05/17/21-05/16/22. She informs medication would begin to ship in 2023 based on when patient last received a refill in 2022.  Agastya Meister P. Kharis Lapenna, [place]  [phone]

## 2021-05-29 ENCOUNTER — Other Ambulatory Visit: Payer: Self-pay

## 2021-05-29 ENCOUNTER — Encounter: Payer: Self-pay | Admitting: Family Medicine

## 2021-05-29 ENCOUNTER — Ambulatory Visit (INDEPENDENT_AMBULATORY_CARE_PROVIDER_SITE_OTHER): Payer: Medicare Other | Admitting: Family Medicine

## 2021-05-29 VITALS — BP 116/79 | HR 85 | Ht 76.0 in | Wt 277.8 lb

## 2021-05-29 DIAGNOSIS — N529 Male erectile dysfunction, unspecified: Secondary | ICD-10-CM | POA: Diagnosis not present

## 2021-05-29 DIAGNOSIS — E1142 Type 2 diabetes mellitus with diabetic polyneuropathy: Secondary | ICD-10-CM | POA: Diagnosis not present

## 2021-05-29 DIAGNOSIS — I480 Paroxysmal atrial fibrillation: Secondary | ICD-10-CM

## 2021-05-29 DIAGNOSIS — I10 Essential (primary) hypertension: Secondary | ICD-10-CM

## 2021-05-29 MED ORDER — SILDENAFIL CITRATE 20 MG PO TABS: ORAL_TABLET | ORAL | 2 refills | Status: AC

## 2021-05-29 NOTE — Patient Instructions (Addendum)
Thank you for coming to the office today.  Recommend discussing the Paroxysmal Atrial Fibrillation with your new Cardiologist in future, if you don't have any episodes in future we can reconsider coming OFF the anticoagulation, blood thinner Eliquis - please discuss further with them. You are on it due to cardiovascular risk with Age 66+, Hypertension, Diabetes. This is to prevent stroke in future.  I would recommend keeping the Metoprolol medication for rate control and Pravastatin cholesterol med to help prevent future risk of stroke/heart attack.  We can reconsider anti-depressant / anxiety medications in future if interested. - Such as Lexapro, Zoloft, these medicines can help but would be daily for up to 6-12 months to help regulate mood/anxiety stressors - Or can consider Buspar which is anxiety only can be beneficial - Or Wellbutrin as well.  A1c sugar today, stay tuned for result on mychart.  DUE for FASTING BLOOD WORK (no food or drink after midnight before the lab appointment, only water or coffee without cream/sugar on the morning of)  SCHEDULE "Lab Only" visit in the morning at the clinic for lab draw in 3 MONTHS   - Make sure Lab Only appointment is at about 1 week before your next appointment, so that results will be available  For Lab Results, once available within 2-3 days of blood draw, you can can log in to MyChart online to view your results and a brief explanation. Also, we can discuss results at next follow-up visit.   Please schedule a Follow-up Appointment to: Return in about 3 months (around 08/27/2021) for 3 month Annual physical AM physical fasting lab after.  If you have any other questions or concerns, please feel free to call the office or send a message through [place]. You may also schedule an earlier appointment if necessary.  Additionally, you may be receiving a survey about your experience at our office within a few days to 1 week by e-mail or mail. We value  your feedback.  Nobie Putnam, DO [place]

## 2021-05-29 NOTE — Progress Notes (Signed)
Subjective:    [name], male    DOB: [birth year], 66 y.o.   MRN: [id number]  Richard Oconnell is a 66 y.o. male presenting on 05/29/2021 for Diabetes and Stress   HPI  Paroxysmal Atrial Fibrillation Followed by cardiology On anticoagulation, BB for rate Has done well Goal to come off medication He feels no further episodes of AFib  CHRONIC DM, Type 2 with neuropathy / Nephropathy HTN Followed by Dr Juleen China Nephro Currently doing well on Novo NOrdisk PAP Meds: Ozempic 34m weekly injection - OFF Metformin - Significant improved diet On ARB Denies any hypoglycemia, polyuria, visual changes, persistent numbness tingling    [facility] 05/29/2021 10/31/2020 05/30/2020  Decreased Interest 1 0 0  Down, Depressed, Hopeless 1 0 0  PHQ - 2 Score 2 0 0  Altered sleeping 0 0 -  Tired, decreased energy 0 0 -  Change in appetite 0 0 -  Feeling bad or failure about yourself  0 0 -  Trouble concentrating 0 0 -  Moving slowly or fidgety/restless 0 0 -  Suicidal thoughts 0 0 -  PHQ-9 Score 2 0 -  Difficult doing work/chores Somewhat difficult Not difficult at all -    Social History   Tobacco Use   Smoking status: Never   Smokeless tobacco: Never   Tobacco comments:    once a while had one cigar >10 years ago  Vaping Use   Vaping Use: Never used  Substance Use Topics   Alcohol use: No    Alcohol/week: 0.0 standard drinks   Drug use: No    Review of Systems Per HPI unless specifically indicated above     Objective:    BP 116/79    Pulse 85    Ht 6' 4"  (1.93 m)    Wt 277 lb 12.8 oz (126 kg)    SpO2 100%    BMI 33.81 kg/m   Wt Readings from Last 3 Encounters:  05/29/21 277 lb 12.8 oz (126 kg)  03/10/21 267 lb 2 oz (121.2 kg)  12/08/20 232 lb 4 oz (105.3 kg)    Physical Exam Vitals and nursing note reviewed.  Constitutional:      General: He is not in acute distress.    Appearance: Normal appearance. He is well-developed. He  is not diaphoretic.     Comments: Well-appearing, comfortable, cooperative  HENT:     Head: Normocephalic and atraumatic.  Eyes:     General:        Right eye: No discharge.        Left eye: No discharge.     Conjunctiva/sclera: Conjunctivae normal.  Cardiovascular:     Rate and Rhythm: Normal rate.  Pulmonary:     Effort: Pulmonary effort is normal.  Skin:    General: Skin is warm and dry.     Findings: No erythema or rash.  Neurological:     Mental Status: He is alert and oriented to person, place, and time.  Psychiatric:        Mood and Affect: Mood normal.        Behavior: Behavior normal.        Thought Content: Thought content normal.     Comments: Well groomed, good eye contact, normal speech and thoughts     Current Outpatient Medications:    apixaban (ELIQUIS) 5 MG TABS tablet, Take 1 tablet (5 mg total) by mouth 2 (two) times daily., Disp: 60 tablet, Rfl: 2  metoprolol tartrate (LOPRESSOR) 25 MG tablet, Take 1 tablet (25 mg total) by mouth 2 (two) times daily., Disp: 180 tablet, Rfl: 1   OZEMPIC, 0.25 OR 0.5 MG/DOSE, 2 MG/1.5ML SOPN, Inject 1 mg into the skin once a week. (Patient taking differently: Inject 1 mg into the skin every Thursday.), Disp: 1.5 mL, Rfl: 0   pravastatin (PRAVACHOL) 20 MG tablet, Take 1 tablet (20 mg total) by mouth daily., Disp: 90 tablet, Rfl: 0   sildenafil (REVATIO) 20 MG tablet, Take 1-5 pills about 30 min prior to sex. Start with 1 and increase as needed., Disp: 30 tablet, Rfl: 2   valsartan (DIOVAN) 320 MG tablet, Take 320 mg by mouth daily., Disp: , Rfl:    Results for orders placed or performed in visit on 03/10/21  CBC  Result Value Ref Range   WBC 8.5 3.4 - 10.8 x10E3/uL   RBC 5.04 4.14 - 5.80 x10E6/uL   Hemoglobin 15.3 13.0 - 17.7 g/dL   Hematocrit 44.6 37.5 - 51.0 %   MCV 89 79 - 97 fL   MCH 30.4 26.6 - 33.0 pg   MCHC 34.3 31.5 - 35.7 g/dL   RDW 13.4 11.6 - 15.4 %   Platelets 300 150 - 450 [id number]  Basic metabolic panel   Result Value Ref Range   Glucose 109 (H) 70 - 99 mg/dL   BUN 22 8 - 27 mg/dL   Creatinine, Ser 1.17 0.76 - 1.27 mg/dL   eGFR 69 >59 mL/min/1.73   BUN/Creatinine Ratio 19 10 - 24   Sodium 139 134 - 144 mmol/L   Potassium 4.7 3.5 - 5.2 mmol/L   Chloride 103 96 - 106 mmol/L   CO2 21 20 - 29 mmol/L   Calcium 10.2 8.6 - 10.2 mg/dL   Recent Labs    09/26/20 0420 06/01/21 1130  HGBA1C 6.7* 6.5*        Assessment & Plan:   Problem List Items Addressed This Visit     Type 2 diabetes mellitus with diabetic polyneuropathy, without long-term current use of insulin (HCC) - Primary   Relevant Orders   POCT glycosylated hemoglobin (Hb A1C)   Atrial fibrillation (HCC)   Relevant Medications   sildenafil (REVATIO) 20 MG tablet   Other Visit Diagnoses     Essential hypertension       Relevant Medications   sildenafil (REVATIO) 20 MG tablet   Erectile dysfunction, unspecified erectile dysfunction type       Relevant Medications   sildenafil (REVATIO) 20 MG tablet       HTN Controlled Continue ARB Valsartan 388m and Metoprolol  T2DM Controlled previously Due for repeat A1c, will do POC A1c today - result A1c 6.5 improved Continue Ozempic 130mweekly per PAP  ED Will re order PDE5 Sildenafil, printed. Can use goodrx  Paroxysmal AFib On anticoag CHADsVASC score 3 risk age 89TN DM Recommend that he discuss PAF anticoag further w/ cardiology to determine if there may be a duration of time without event reconsider off med  I would recommend keeping the Metoprolol medication for rate control and Pravastatin cholesterol med to help prevent future risk of stroke/heart attack.  Anxiety/stressors - complicated hospitalization/illness  We can reconsider anti-depressant / anxiety medications in future if interested. - Such as Lexapro, Zoloft, these medicines can help but would be daily for up to 6-12 months to help regulate mood/anxiety stressors - Or can consider Buspar which  is anxiety only can be beneficial - Or Wellbutrin as  well.  Meds ordered this encounter  Medications   sildenafil (REVATIO) 20 MG tablet    Sig: Take 1-5 pills about 30 min prior to sex. Start with 1 and increase as needed.    Dispense:  30 tablet    Refill:  2      Follow up plan: Return in about 3 months (around 08/27/2021) for 3 month Annual physical AM physical fasting lab after.   Nobie Putnam, [facility] 05/29/2021, 2:32 PM

## 2021-06-01 ENCOUNTER — Encounter: Payer: Self-pay | Admitting: Family Medicine

## 2021-06-01 LAB — POCT GLYCOSYLATED HEMOGLOBIN (HGB A1C): Hemoglobin A1C: 6.5 % — AB (ref 4.0–5.6)

## 2021-06-02 ENCOUNTER — Other Ambulatory Visit: Payer: Self-pay | Admitting: Family Medicine

## 2021-06-02 DIAGNOSIS — I480 Paroxysmal atrial fibrillation: Secondary | ICD-10-CM

## 2021-06-02 NOTE — Telephone Encounter (Signed)
Requested Prescriptions  Pending Prescriptions Disp Refills   ELIQUIS 5 MG TABS tablet [Pharmacy Med Name: ELIQUIS 5 MG TABLET] 180 tablet 2    Sig: Take 1 tablet (5 mg total) by mouth 2 (two) times daily.     Hematology:  Anticoagulants Passed - 06/02/2021 12:23 PM      Passed - HGB in normal range and within 360 days    Hemoglobin  Date Value Ref Range Status  03/10/2021 15.3 13.0 - 17.7 g/dL Final         Passed - PLT in normal range and within 360 days    Platelets  Date Value Ref Range Status  03/10/2021 300 150 - 450 x10E3/uL Final         Passed - HCT in normal range and within 360 days    Hematocrit  Date Value Ref Range Status  03/10/2021 44.6 37.5 - 51.0 % Final         Passed - Cr in normal range and within 360 days    Creat  Date Value Ref Range Status  08/19/2020 1.10 0.70 - 1.25 mg/dL Final    Comment:    For patients >2 years of age, the reference limit for Creatinine is approximately 13% higher for people identified as African-American. .    Creatinine, Ser  Date Value Ref Range Status  03/10/2021 1.17 0.76 - 1.27 mg/dL Final   Creatinine, Urine  Date Value Ref Range Status  09/26/2020 197 mg/dL Final         Passed - Valid encounter within last 12 months    Recent Outpatient Visits          4 days ago Type 2 diabetes mellitus with diabetic polyneuropathy, without long-term current use of insulin [facility])   [place], DO   7 months ago Intra-abdominal abscess post-procedure   [place], DO   9 months ago Pre-op evaluation   [place], DO   10 months ago Acute cholecystitis   [place], DO   10 months ago Generalized abdominal pain   [place], Devonne Doughty, [place]

## 2021-06-25 NOTE — Telephone Encounter (Signed)
Copied from [place] [id number]. Topic: General - Inquiry >> Jun 24, 2021 10:51 AM [place] wrote: Reason for CRM: Pt wanting to know if ozempic is in from medical assistance  program approved for fu [phone]

## 2021-07-13 ENCOUNTER — Ambulatory Visit (INDEPENDENT_AMBULATORY_CARE_PROVIDER_SITE_OTHER): Payer: Medicare Other | Admitting: Pharmacist

## 2021-07-13 DIAGNOSIS — E1169 Type 2 diabetes mellitus with other specified complication: Secondary | ICD-10-CM

## 2021-07-13 DIAGNOSIS — E1142 Type 2 diabetes mellitus with diabetic polyneuropathy: Secondary | ICD-10-CM

## 2021-07-13 DIAGNOSIS — I10 Essential (primary) hypertension: Secondary | ICD-10-CM

## 2021-07-13 DIAGNOSIS — I480 Paroxysmal atrial fibrillation: Secondary | ICD-10-CM

## 2021-07-13 NOTE — Chronic Care Management (AMB) (Signed)
Chronic Care Management CCM Pharmacy Note  07/13/2021 Name:  Richard Oconnell MRN:  [id number] DOB:  [birth year]   Subjective: Richard Oconnell is an 66 y.o. year old male who is a primary patient of [name], DO.  The CCM team was consulted for assistance with disease management and care coordination needs.    Engaged with patient by telephone for follow up visit for pharmacy case management and/or care coordination services.   Objective:  Medications Reviewed Today     Reviewed by Rennis Petty, RPH-CPP (Pharmacist) on 07/13/21 at 1320  Med List Status: <None>   Medication Order Taking? Sig Documenting Provider Last Dose Status Informant  ELIQUIS 5 MG TABS tablet [id number] Yes Take 1 tablet (5 mg total) by mouth 2 (two) times daily. [name], DO Taking Active   metoprolol tartrate (LOPRESSOR) 25 MG tablet [id number] Yes Take 1 tablet (25 mg total) by mouth 2 (two) times daily. [name], DO Taking Active   OZEMPIC, 0.25 OR 0.5 MG/DOSE, 2 MG/1.5ML SOPN [id number] Yes Inject 1 mg into the skin once a week.  Patient taking differently: Inject 1 mg into the skin every Thursday.   [name], DO Taking Active            Med Note Kellie Simmering, Jenene Slicker   Fri Aug 15, 2020  2:42 PM)    pravastatin (PRAVACHOL) 20 MG tablet [id number] Yes Take 1 tablet (20 mg total) by mouth daily. [name], DO Taking Active   sildenafil (REVATIO) 20 MG tablet [id number]  Take 1-5 pills about 30 min prior to sex. Start with 1 and increase as needed. Karamalegos, Devonne Doughty, DO  Active   valsartan (DIOVAN) 320 MG tablet [id number] Yes Take 320 mg by mouth daily. [provider] Taking Active             Pertinent Labs:  Lab Results  Component Value Date   HGBA1C 6.5 (A) 06/01/2021   Lab Results  Component Value Date   CHOL 105 07/21/2020   HDL 33 (L) 07/21/2020   LDLCALC 45 07/21/2020   TRIG 226 (H)  09/28/2020   CHOLHDL 3.2 07/21/2020   Lab Results  Component Value Date   CREATININE 1.17 03/10/2021   BUN 22 03/10/2021   NA 139 03/10/2021   K 4.7 03/10/2021   CL 103 03/10/2021   CO2 21 03/10/2021   BP Readings from Last 3 Encounters:  05/29/21 116/79  03/10/21 122/80  12/08/20 138/82   Pulse Readings from Last 3 Encounters:  05/29/21 85  03/10/21 75  12/08/20 69     SDOH:  (Social Determinants of Health) assessments and interventions performed:    [place]  Review of patient past medical history, allergies, medications, health status, including review of consultants reports, laboratory and other test data, was performed as part of comprehensive evaluation and provision of chronic care management services.   Care Plan : PharmD - Medication Assistance  Updates made by Rennis Petty, RPH-CPP since 07/13/2021 12:00 AM     Problem: Disease Progression      Long-Range Goal: Disease Progression Prevented or Minimized   Start Date: 06/06/2020  Expected End Date: 09/04/2020  Recent Progress: On track  Priority: High  Note:   Current Barriers:  Difficulty with independently affording treatment regimen. Reports cost of Ozempic is difficult to afford, particularly when enters coverage gap of [name] Patient APPROVED for patient assistance from [employer] through 05/16/2022  Pharmacist Clinical Goal(s):  Over the next 90 days, patient will verbalize ability to afford treatment regimen through collaboration with PharmD and provider.   Interventions: 1:1 collaboration with [name], DO regarding development and update of comprehensive plan of care as evidenced by provider attestation and co-signature Inter-disciplinary care team collaboration (see longitudinal plan of care) Perform chart review Office Visit with PCP on 05/29/2021 for follow up  Medication Assistance: Collaborated with [place] Simcox for support to patient  with re-enrollment in [name] patient assistance program for 2023 calendar year Received message from [place] that patient was APPROVED 05/17/21-05/16/22 for re-enrollment in [name] assistance program Reports received supply of Ozempic from patient assistance program last week  Type 2 Diabetes: Controlled; current treatment: Ozempic 1mg  weekly Reports recent fasting morning blood sugars: ~120s Encourage patient to have regular well-balanced meals throughout the day, while limiting carbohydrate portion sizes Exercise: mostly just movement around the house Would like to aim for a goal of exercising ~30 minutes/day x 5 days/week Encourage patient to have annual eye exam   Hypertension/ Atrial Fibrillation Current treatment: Eliquis 5 mg twice daily Metoprolol 25 mg twice daily valsartan 320mg  daily Reports recent home blood pressure readings: Last checked today: 130/82 Denies signs of hypotension and hypertension Counsel on blood pressure monitoring technique, particularly importance of resting prior to readings Encourage patient to continue to monitor home BP and HR, keep log of results and to bring this record to medical appointments   Hyperlipidemia:  Controlled; current treatment: pravastatin 20 mg daily Encourage patient to order refill of pravastatin  Medication Assistance: Have counseled patient on Eliquis patient assistance program, including income and annual out of pocket expense requirements Note patient has previously advised that he has applied for Extra Help subsidy, but does not qualify based on assets Patient requests to follow up with CCM Pharmacist mid-year to discuss further as will be closer to the coverage gap of his health plan at this time.   Patient Goals/Self-Care Activities Over the next 90 days, patient will:  - check glucose, document, and provide at future appointments - check blood pressure, document, and provide at future appointments -  collaborate with provider on medication access solutions - attend medical appointments as scheduled   Follow Up Plan:  Telephone follow up appointment with care management team member scheduled for: 11/25/2021 at 1 pm      Wallace Cullens, PharmD, [place], [place] [phone]

## 2021-07-13 NOTE — Patient Instructions (Signed)
Visit Information  Thank you for taking time to visit with me today. Please don't hesitate to contact me if I can be of assistance to you before our next scheduled telephone appointment.  Following are the goals we discussed today:   Goals Addressed             This Visit's Progress    Pharmacy Goals       Our goal bad cholesterol, or LDL, is less than 70 . This is why it is important to continue taking your pravastatin  Please check your home blood pressure, keep a log of the results and bring this with you to your medical appointments.  Feel free to call me with any questions or concerns. I look forward to our next call!   Wallace Cullens, PharmD, [place] [phone]         Our next appointment is by telephone on 11/25/2021 at 1 pm  Please call the care guide team at [phone] if you need to cancel or reschedule your appointment.    Patient verbalizes understanding of instructions and care plan provided today and agrees to view in [name]. Active MyChart status confirmed with patient.

## 2021-07-14 DIAGNOSIS — E785 Hyperlipidemia, unspecified: Secondary | ICD-10-CM

## 2021-07-14 DIAGNOSIS — I480 Paroxysmal atrial fibrillation: Secondary | ICD-10-CM | POA: Diagnosis not present

## 2021-07-14 DIAGNOSIS — E1169 Type 2 diabetes mellitus with other specified complication: Secondary | ICD-10-CM

## 2021-07-14 DIAGNOSIS — E1142 Type 2 diabetes mellitus with diabetic polyneuropathy: Secondary | ICD-10-CM | POA: Diagnosis not present

## 2021-07-14 DIAGNOSIS — I10 Essential (primary) hypertension: Secondary | ICD-10-CM

## 2021-07-29 ENCOUNTER — Other Ambulatory Visit: Payer: Self-pay | Admitting: Family Medicine

## 2021-07-29 DIAGNOSIS — E1169 Type 2 diabetes mellitus with other specified complication: Secondary | ICD-10-CM

## 2021-07-30 NOTE — Telephone Encounter (Signed)
Requested medication (s) are due for refill today: yes ? ?Requested medication (s) are on the active medication list: yes ? ?Last refill:  05/02/21 #90 0 refills ? ?Future visit scheduled: no ? ?Notes to clinic:  last lipid panel 07/21/20. Do you want to continue refills? ? ? ?  ?Requested Prescriptions  ?Pending Prescriptions Disp Refills  ? pravastatin (PRAVACHOL) 20 MG tablet [Pharmacy Med Name: PRAVASTATIN SODIUM 20 MG TAB] 90 tablet 0  ?  Sig: Take 1 tablet (20 mg total) by mouth daily.  ?  ? Cardiovascular:  Antilipid - Statins Failed - 07/29/2021  6:58 PM  ?  ?  Failed - Lipid Panel in normal range within the last 12 months  ?  Cholesterol  ?Date Value Ref Range Status  ?07/21/2020 105 <200 mg/dL Final  ? ?LDL Cholesterol (Calc)  ?Date Value Ref Range Status  ?07/21/2020 45 mg/dL (calc) Final  ?  Comment:  ?  Reference range: <100 ?Marland Kitchen ?Desirable range <100 mg/dL for primary prevention;   ?<70 mg/dL for patients with CHD or diabetic patients  ?with > or = 2 CHD risk factors. ?. ?LDL-C is now calculated using the Martin-Hopkins  ?calculation, which is a validated novel method providing  ?better accuracy than the Friedewald equation in the  ?estimation of LDL-C.  ?Cresenciano Genre et al. Annamaria Helling. 1975;883(25): 2061-2068  ?(http://education.Quest[employer]ostics.com/faq/FAQ164) ?  ? ?HDL  ?Date Value Ref Range Status  ?07/21/2020 33 (L) > OR = 40 mg/dL Final  ? ?Triglycerides  ?Date Value Ref Range Status  ?09/28/2020 226 (H) <150 mg/dL Final  ?  Comment:  ?  Performed at [facility], [address] [place], [place] [address]  ? ?  ?  ?  Passed - Patient is not pregnant  ?  ?  Passed - Valid encounter within last 12 months  ?  Recent Outpatient Visits   ? ?      ? 2 months ago Type 2 diabetes mellitus with diabetic polyneuropathy, without long-term current use of insulin ([place])  ? [place], DO  ? 9 months ago Intra-abdominal abscess post-procedure  ? [place], DO  ? 11 months ago Pre-op evaluation  ? [place], DO  ? 1 year ago Acute cholecystitis  ? [place], DO  ? 1 year ago Generalized abdominal pain  ? [place], DO  ? ?  ?  ? ?  ?  ?  ? ?

## 2021-08-10 ENCOUNTER — Other Ambulatory Visit: Payer: Self-pay | Admitting: Family Medicine

## 2021-08-10 DIAGNOSIS — I10 Essential (primary) hypertension: Secondary | ICD-10-CM

## 2021-08-10 DIAGNOSIS — I480 Paroxysmal atrial fibrillation: Secondary | ICD-10-CM

## 2021-08-10 NOTE — Telephone Encounter (Signed)
Medication Refill - Medication: metoprolol tartrate (LOPRESSOR) 25 MG tablet  ?Pt asked if this can be sent today / he is completely out and pharmacy has sent request / please advise  ?Has the patient contacted their pharmacy? Yes.   ?(Agent: If no, request that the patient contact the pharmacy for the refill. If patient does not wish to contact the pharmacy document the reason why and proceed with request.) ?(Agent: If yes, when and what did the pharmacy advise?) pharmacy has sent request  ? ?Preferred Pharmacy (with phone number or street name): [facility], [address]  ?[place], [place] [address]  ?Phone:  [phone]  Fax:  [phone] ?Has the patient been seen for an appointment in the last year OR does the patient have an upcoming appointment? Yes.   ? ?Agent: Please be advised that RX refills may take up to 3 business days. We ask that you follow-up with your pharmacy. ?

## 2021-08-11 MED ORDER — METOPROLOL TARTRATE 25 MG PO TABS: 25.0000 mg | ORAL_TABLET | Freq: Two times a day (BID) | ORAL | 0 refills | Status: DC

## 2021-08-11 NOTE — Telephone Encounter (Signed)
Requested Prescriptions  ?Pending Prescriptions Disp Refills  ?? metoprolol tartrate (LOPRESSOR) 25 MG tablet 180 tablet 1  ?  Sig: Take 1 tablet (25 mg total) by mouth 2 (two) times daily.  ?  ? Cardiovascular:  Beta Blockers Passed - 08/10/2021 11:43 PM  ?  ?  Passed - Last BP in normal range  ?  BP Readings from Last 1 Encounters:  ?05/29/21 116/79  ?   ?  ?  Passed - Last Heart Rate in normal range  ?  Pulse Readings from Last 1 Encounters:  ?05/29/21 85  ?   ?  ?  Passed - Valid encounter within last 6 months  ?  Recent Outpatient Visits   ?      ? 2 months ago Type 2 diabetes mellitus with diabetic polyneuropathy, without long-term current use of insulin ([place])  ? [place], DO  ? 9 months ago Intra-abdominal abscess post-procedure  ? [place], DO  ? 11 months ago Pre-op evaluation  ? [place], DO  ? 1 year ago Acute cholecystitis  ? [place], DO  ? 1 year ago Generalized abdominal pain  ? [place], DO  ?  ?  ? ?  ?  ?  ? ? ?

## 2021-09-09 DIAGNOSIS — I10 Essential (primary) hypertension: Secondary | ICD-10-CM | POA: Diagnosis not present

## 2021-09-09 DIAGNOSIS — E1129 Type 2 diabetes mellitus with other diabetic kidney complication: Secondary | ICD-10-CM | POA: Diagnosis not present

## 2021-09-09 DIAGNOSIS — N179 Acute kidney failure, unspecified: Secondary | ICD-10-CM | POA: Diagnosis not present

## 2021-09-12 ENCOUNTER — Other Ambulatory Visit: Payer: Self-pay | Admitting: Family Medicine

## 2021-09-12 DIAGNOSIS — I480 Paroxysmal atrial fibrillation: Secondary | ICD-10-CM

## 2021-09-14 ENCOUNTER — Ambulatory Visit (INDEPENDENT_AMBULATORY_CARE_PROVIDER_SITE_OTHER): Payer: Medicare Other | Admitting: Family Medicine

## 2021-09-14 ENCOUNTER — Encounter: Payer: Self-pay | Admitting: Family Medicine

## 2021-09-14 VITALS — BP 132/86 | HR 77 | Ht 76.0 in | Wt 277.4 lb

## 2021-09-14 DIAGNOSIS — L720 Epidermal cyst: Secondary | ICD-10-CM

## 2021-09-14 MED ORDER — DOXYCYCLINE HYCLATE 100 MG PO TABS: 100.0000 mg | ORAL_TABLET | Freq: Two times a day (BID) | ORAL | 0 refills | Status: DC

## 2021-09-14 NOTE — Patient Instructions (Addendum)
Thank you for coming to the office today. ? ?Likely Cyst on back, it feels firm, and could also be Lipoma. ?The 3rd one appears to be slightly red and inflamed. Let's do the antibiotic course, since cannot reach with a topical. ? ?Start taking Doxycycline antibiotic '100mg'$  twice daily for 10 days. Take with full glass of water and stay upright for at least 30 min after taking, may be seated or standing, but should NOT lay down. This is just a safety precaution, if this medicine does not go all the way down throat well it could cause some burning discomfort to throat and esophagus. ? ?Re ordered Eliquis twice a day now at 60 pill count with 5 additional refills ? ?Please schedule a Follow-up Appointment to: Return if symptoms worsen or fail to improve. ? ?If you have any other questions or concerns, please feel free to call the office or send a message through [place]. You may also schedule an earlier appointment if necessary. ? ?Additionally, you may be receiving a survey about your experience at our office within a few days to 1 week by e-mail or mail. We value your feedback. ? ?Nobie Putnam, DO ?[address] ?

## 2021-09-14 NOTE — Progress Notes (Signed)
? ?Subjective:  ? ? [name], male    DOB: 1955/11/17, 66 y.o.   MRN: [id number] ? ?Richard Oconnell is a 66 y.o. male presenting on 09/14/2021 for Abscess ? ? ?HPI ? ?Back Cyst / Abscess ?Reports multiple locations of cyst on back in past. Now has one that was bothering him more prominent some itching. Non tender. He bothered it recently and it did not drain. No bleeding or pus. No fever or chills ? ? ? ?  05/29/2021  ?  2:15 PM 10/31/2020  ? 10:32 AM 05/30/2020  ?  9:29 AM  ?Depression screen PHQ 2/9  ?Decreased Interest 1 0 0  ?Down, Depressed, Hopeless 1 0 0  ?PHQ - 2 Score 2 0 0  ?Altered sleeping 0 0   ?Tired, decreased energy 0 0   ?Change in appetite 0 0   ?Feeling bad or failure about yourself  0 0   ?Trouble concentrating 0 0   ?Moving slowly or fidgety/restless 0 0   ?Suicidal thoughts 0 0   ?PHQ-9 Score 2 0   ?Difficult doing work/chores Somewhat difficult Not difficult at all   ? ? ?Social History  ? ?Tobacco Use  ? Smoking status: Never  ? Smokeless tobacco: Never  ? Tobacco comments:  ?  once a while had one cigar >10 years ago  ?Vaping Use  ? Vaping Use: Never used  ?Substance Use Topics  ? Alcohol use: No  ?  Alcohol/week: 0.0 standard drinks  ? Drug use: No  ? ? ?Review of Systems ?Per HPI unless specifically indicated above ? ?   ?Objective:  ?  ?BP 132/86   Pulse 77   Ht '6\' 4"'$  (1.93 m)   Wt 277 lb 6.4 oz (125.8 kg)   SpO2 99%   BMI 33.77 kg/m?   ?Wt Readings from Last 3 Encounters:  ?09/14/21 277 lb 6.4 oz (125.8 kg)  ?05/29/21 277 lb 12.8 oz (126 kg)  ?03/10/21 267 lb 2 oz (121.2 kg)  ?  ?Physical Exam ?Vitals and nursing note reviewed.  ?Constitutional:   ?   General: He is not in acute distress. ?   Appearance: Normal appearance. He is well-developed. He is not diaphoretic.  ?   Comments: Well-appearing, comfortable, cooperative  ?HENT:  ?   Head: Normocephalic and atraumatic.  ?Eyes:  ?   General:     ?   Right eye: No discharge.     ?   Left eye: No discharge.  ?    Conjunctiva/sclera: Conjunctivae normal.  ?Cardiovascular:  ?   Rate and Rhythm: Normal rate.  ?Pulmonary:  ?   Effort: Pulmonary effort is normal.  ?Skin: ?   General: Skin is warm and dry.  ?   Findings: Lesion (multiple large 2+ cm cysts midline central spine) present. No erythema or rash.  ?Neurological:  ?   Mental Status: He is alert and oriented to person, place, and time.  ?Psychiatric:     ?   Mood and Affect: Mood normal.     ?   Behavior: Behavior normal.     ?   Thought Content: Thought content normal.  ?   Comments: Well groomed, good eye contact, normal speech and thoughts  ? ? ?Midline Upper Thoracic ? ? ? ?Upper Left Back ? ? ? ?Results for orders placed or performed in visit on 05/29/21  ?POCT glycosylated hemoglobin (Hb A1C)  ?Result Value Ref Range  ? Hemoglobin A1C 6.5 (  A) 4.0 - 5.6 %  ? ?   ?Assessment & Plan:  ? ?Problem List Items Addressed This Visit   ?None ?Visit Diagnoses   ? ? Epidermoid cyst of skin of back    -  Primary  ? Relevant Medications  ? doxycycline (VIBRA-TABS) 100 MG tablet  ? ?  ?  ?Consistent with new acute midline spinal epidermal cyst now has x 3 locations midline of spine, 3rd one lowest over spine. No cellulitis extending. ? ?Plan: ?1. Mild appearance today with some inflammation but no obvious infection. No abscess. No I&D. ?- future would recommend Gen Surgery for excision cyst in future ? ?Start taking Doxycycline antibiotic '100mg'$  twice daily for 10 days. Take with full glass of water and stay upright for at least 30 min after taking, may be seated or standing, but should NOT lay down. This is just a safety precaution, if this medicine does not go all the way down throat well it could cause some burning discomfort to throat and esophagus. ? ? ?Return precautions given if worsening ? ? ?Meds ordered this encounter  ?Medications  ? doxycycline (VIBRA-TABS) 100 MG tablet  ?  Sig: Take 1 tablet (100 mg total) by mouth 2 (two) times daily. For 10 days. Take with full  glass of water, stay upright 30 min after taking.  ?  Dispense:  20 tablet  ?  Refill:  0  ? ? ?Follow up plan: ?Return if symptoms worsen or fail to improve. ? ? ?Nobie Putnam, DO ?[facility] ?[facility] ?09/14/2021, 10:58 AM ?

## 2021-09-21 ENCOUNTER — Ambulatory Visit (INDEPENDENT_AMBULATORY_CARE_PROVIDER_SITE_OTHER): Payer: Medicare Other | Admitting: Pharmacist

## 2021-09-21 DIAGNOSIS — E1142 Type 2 diabetes mellitus with diabetic polyneuropathy: Secondary | ICD-10-CM

## 2021-09-21 NOTE — Patient Instructions (Signed)
Visit Information ? ?Thank you for taking time to visit with me today. Please don't hesitate to contact me if I can be of assistance to you before our next scheduled telephone appointment. ? ?Following are the goals we discussed today:  ? Goals Addressed   ? ?  ?  ?  ?  ? This Visit's Progress  ?  Pharmacy Goals     ?  Our goal bad cholesterol, or LDL, is less than 70 . This is why it is important to continue taking your pravastatin ? ?Please check your home blood pressure, keep a log of the results and bring this with you to your medical appointments. ? ?Feel free to call me with any questions or concerns. I look forward to our next call! ? ?Wallace Cullens, PharmD, BCACP ?Clinical Pharmacist ?[facility] ?[place] ?[phone] ?  ? ?  ? ? ? ?Our next appointment is by telephone on 11/25/2021 at 1 pm ? ?Please call the care guide team at [phone] if you need to cancel or reschedule your appointment.  ? ? ?Patient verbalizes understanding of instructions and care plan provided today and agrees to view in [place]. Active MyChart status confirmed with patient.   ? ?

## 2021-09-21 NOTE — Chronic Care Management (AMB) (Signed)
? ?Chronic Care Management ?CCM Pharmacy Note ? ?09/21/2021 ?Name:  Younis Mathey MRN:  [id number] DOB:  10/23/1955 ? ? ?Subjective: ?Draydon Clairmont is an 66 y.o. year old male who is a primary patient of [name], DO.  The CCM team was consulted for assistance with disease management and care coordination needs.   ? ?Engaged with patient by telephone for follow up visit for pharmacy case management and/or care coordination services.  ? ?Objective: ? ?Medications Reviewed Today   ? ? Reviewed by [name], DO (Physician) on 09/14/21 at 1128  Med List Status: <None>  ? ?Medication Order Taking? Sig Documenting Provider Last Dose Status Informant  ?apixaban (ELIQUIS) 5 MG TABS tablet [id number]  Take 1 tablet (5 mg total) by mouth 2 (two) times daily. [name], DO  Active   ?doxycycline (VIBRA-TABS) 100 MG tablet [id number] Yes Take 1 tablet (100 mg total) by mouth 2 (two) times daily. For 10 days. Take with full glass of water, stay upright 30 min after taking. [name], DO  Active   ?metoprolol tartrate (LOPRESSOR) 25 MG tablet [id number] Yes Take 1 tablet (25 mg total) by mouth 2 (two) times daily. [name], DO Taking Active   ?OZEMPIC, 0.25 OR 0.5 MG/DOSE, 2 MG/1.5ML SOPN [id number] Yes Inject 1 mg into the skin once a week.  ?Patient taking differently: Inject 1 mg into the skin every Thursday.  ? [name], DO Taking Active   ?         ?Med Note Nat Christen   Fri Aug 15, 2020  2:42 PM)    ?pravastatin (PRAVACHOL) 20 MG tablet [id number] Yes Take 1 tablet (20 mg total) by mouth daily. [name], DO Taking Active   ?sildenafil (REVATIO) 20 MG tablet [id number] Yes Take 1-5 pills about 30 min prior to sex. Start with 1 and increase as needed. [name], DO Taking Active   ?valsartan (DIOVAN) 320 MG tablet [id number] Yes Take 320 mg by mouth daily. [provider] Taking Active    ? ?  ?  ? ?  ? ? ?Pertinent Labs:  ?Lab Results  ?Component Value Date  ? HGBA1C 6.5 (A) 06/01/2021  ? ?Lab Results  ?Component Value Date  ? CHOL 105 07/21/2020  ? HDL 33 (L) 07/21/2020  ? LDLCALC 45 07/21/2020  ? TRIG 226 (H) 09/28/2020  ? CHOLHDL 3.2 07/21/2020  ? ?Lab Results  ?Component Value Date  ? CREATININE 1.17 03/10/2021  ? BUN 22 03/10/2021  ? NA 139 03/10/2021  ? K 4.7 03/10/2021  ? CL 103 03/10/2021  ? CO2 21 03/10/2021  ? ? ?SDOH:  (Social Determinants of Health) assessments and interventions performed:  ? ? ?CCM Care Plan ? ?Review of patient past medical history, allergies, medications, health status, including review of consultants reports, laboratory and other test data, was performed as part of comprehensive evaluation and provision of chronic care management services.  ? ?Care Plan : PharmD - Medication Assistance  ?Updates made by Rennis Petty, RPH-CPP since 09/21/2021 12:00 AM  ?  ? ?Problem: Disease Progression   ?  ? ?Long-Range Goal: Disease Progression Prevented or Minimized   ?Start Date: 06/06/2020  ?Expected End Date: 09/04/2020  ?Recent Progress: On track  ?Priority: High  ?Note:   ?Current Barriers:  ?Difficulty with independently affording treatment regimen. Reports cost of Ozempic is difficult to afford, particularly when enters coverage gap of [employer] Medicare Part D  plan ?Patient APPROVED for patient assistance from [place] through 05/16/2022 ? ?Pharmacist Clinical Goal(s):  ?Over the next 90 days, patient will verbalize ability to afford treatment regimen through collaboration with PharmD and provider.  ? ?Interventions: ?1:1 collaboration with [name], DO regarding development and update of comprehensive plan of care as evidenced by provider attestation and co-signature ?Inter-disciplinary care team collaboration (see longitudinal plan of care) ?Receive a call from patient requesting a call back regarding medication assistance ?Return call to  patient ?Perform chart review. Patient seen for Office Visit with PCP on 5/1 related to a cyst. Provider advised patient: ?Start taking Doxycycline antibiotic '100mg'$  twice daily for 10 days ?Today patient confirms taking doxycycline Rx as directed. Remind patient to complete full course. ? ?Medication Assistance: ?Collaborated with [place] [employer] for support to patient with re-enrollment in [name] patient assistance program for 2023 calendar year ?Today patient asks about next shipment date for Ozempic. Reports has at least >1 month supply remaining ?Counsel patient that supply of Ozempic from assistance program should ship ~every 4 months on automatic refill per program. However, advise patient in future if has <1 month supply remaining and has not yet receive a refill, to follow up with office to see if received and if not, to follow up with [name] assistance program. ?Also, remind patient that he can follow up with [name] assistance program at any time to request date of next shipment ?Patient to contact CM Pharmacist or [facility] Sharee [place] [employer] if needing further support  ?Have counseled patient on Eliquis patient assistance program, including income and annual out of pocket expense requirements ?Note patient has previously advised that he has applied for Extra Help subsidy, but does not qualify based on assets ?Patient to follow up with CCM Pharmacist mid-year to discuss further as will be closer to the coverage gap of his health plan at this time. ? ?Type 2 Diabetes: ?Controlled; current treatment: ?Ozempic '1mg'$  weekly ?Denies checking blood sugar recently ?Have encouraged patient to have regular well-balanced meals throughout the day, while limiting carbohydrate portion sizes ?  ? ?Patient Goals/Self-Care Activities ?Over the next 90 days, patient will:  ?- check glucose, document, and provide at future appointments ?- check blood pressure, document, and provide at future appointments ?-  collaborate with provider on medication access solutions ?- attend medical appointments as scheduled ?  ?Follow Up Plan:  Telephone follow up appointment with care management team member scheduled for: 11/25/2021 at 1 pm ?  ?  ? ?Wallace Cullens, PharmD, BCACP, CPP ?Clinical Pharmacist ?[facility] ?[place] ?[phone] ? ? ? ? ? ?

## 2021-09-25 ENCOUNTER — Ambulatory Visit: Payer: Medicare Other | Admitting: Pharmacist

## 2021-09-25 DIAGNOSIS — I10 Essential (primary) hypertension: Secondary | ICD-10-CM

## 2021-09-25 DIAGNOSIS — I480 Paroxysmal atrial fibrillation: Secondary | ICD-10-CM

## 2021-09-25 DIAGNOSIS — E1142 Type 2 diabetes mellitus with diabetic polyneuropathy: Secondary | ICD-10-CM

## 2021-09-25 NOTE — Chronic Care Management (AMB) (Signed)
? ?Chronic Care Management ?CCM Pharmacy Note ? ?09/25/2021 ?Name:  Richard Oconnell MRN:  [id number] DOB:  12-30-1955 ? ?Subjective: ? ?Richard Oconnell is an 66 y.o. year old male who is a primary patient of Richard Hauser, DO.  The CCM team was consulted for assistance with disease management and care coordination needs.   ? ?Receive a message from patient requesting a call back regarding a medication question. ? ?Engaged with patient by telephone for follow up visit for pharmacy case management and/or care coordination services.  ? ?Objective: ? ?Medications Reviewed Today   ? ? Reviewed by Richard Oconnell, RPH-CPP (Pharmacist) on 09/25/21 at [place] List Status: <None>  ? ?Medication Order Taking? Sig Documenting Provider Last Dose Status Informant  ?apixaban (ELIQUIS) 5 MG TABS tablet [id number]  Take 1 tablet (5 mg total) by mouth 2 (two) times daily. Richard Hauser, DO  Active   ?metoprolol tartrate (LOPRESSOR) 25 MG tablet [id number] Yes Take 1 tablet (25 mg total) by mouth 2 (two) times daily. Richard Hauser, DO Taking Active   ?OZEMPIC, 0.25 OR 0.5 MG/DOSE, 2 MG/1.5ML SOPN [id number]  Inject 1 mg into the skin once a week.  ?Patient taking differently: Inject 1 mg into the skin every Thursday.  ? Richard Hauser, DO  Active   ?         ?Med Note Nat Christen   Fri Aug 15, 2020  2:42 PM)    ?pravastatin (PRAVACHOL) 20 MG tablet [id number]  Take 1 tablet (20 mg total) by mouth daily. Richard Hauser, DO  Active   ?sildenafil (REVATIO) 20 MG tablet [id number]  Take 1-5 pills about 30 min prior to sex. Start with 1 and increase as needed. Richard Hauser, DO  Active   ?valsartan (DIOVAN) 320 MG tablet [id number] Yes Take 320 mg by mouth daily. [provider] Taking Active   ? ?  ?  ? ?  ? ? ?Pertinent Labs:  ?Lab Results  ?Component Value Date  ? HGBA1C 6.5 (A) 06/01/2021  ? ?Lab Results  ?Component Value Date  ? CHOL 105 07/21/2020   ? HDL 33 (L) 07/21/2020  ? LDLCALC 45 07/21/2020  ? TRIG 226 (H) 09/28/2020  ? CHOLHDL 3.2 07/21/2020  ? ?Lab Results  ?Component Value Date  ? CREATININE 1.17 03/10/2021  ? BUN 22 03/10/2021  ? NA 139 03/10/2021  ? K 4.7 03/10/2021  ? CL 103 03/10/2021  ? CO2 21 03/10/2021  ? ?BP Readings from Last 3 Encounters:  ?09/14/21 132/86  ?05/29/21 116/79  ?03/10/21 122/80  ? ?Pulse Readings from Last 3 Encounters:  ?09/14/21 77  ?05/29/21 85  ?03/10/21 75  ? ? ? ?SDOH:  (Social Determinants of Health) assessments and interventions performed:  ? ? ?CCM Care Plan ? ?Review of patient past medical history, allergies, medications, health status, including review of consultants reports, laboratory and other test data, was performed as part of comprehensive evaluation and provision of chronic care management services.  ? ?Care Plan : PharmD - Medication Assistance  ?Updates made by Richard Oconnell, RPH-CPP since 09/25/2021 12:00 AM  ?  ? ?Problem: Disease Progression   ?  ? ?Long-Range Goal: Disease Progression Prevented or Minimized   ?Start Date: 06/06/2020  ?Expected End Date: 09/04/2020  ?Recent Progress: On track  ?Priority: High  ?Note:   ?Current Barriers:  ?Difficulty with independently affording treatment regimen. Reports cost of Ozempic is difficult to afford, particularly when enters  coverage gap of Richard Oconnell plan ?Patient APPROVED for patient assistance from Richard Oconnell through 05/16/2022 ? ?Pharmacist Clinical Goal(s):  ?Over the next 90 days, patient will verbalize ability to afford treatment regimen through collaboration with PharmD and provider.  ? ?Interventions: ?1:1 collaboration with Richard Hauser, DO regarding development and update of comprehensive plan of care as evidenced by provider attestation and co-signature ?Inter-disciplinary care team collaboration (see longitudinal plan of care) ?Receive phone call from patient requesting confirmation regarding his current metoprolol  dose ?Return call to patient who reports he has been taking metoprolol tartrate 25 mg - 1 tablet twice daily, but when he called to refill this Rx, noticed that latest bottle indicated to take ? tablet twice daily. ?From review of chart, note patient's dose of metoprolol tartrate was increased to 25 mg twice daily when patient was discharged from Richard Oconnell on 10/15/2020.  ?From review of dispensing history, find that Richard Oconnell Oconnell had been filling metoprolol Rx with updated direction (25 mg twice daily), but then dispensed Rx with previous direction on 08/08/2021. ?Today call to follow up with Richard Oconnell. Speak with RPh and request pharmacy deactivate old metoprolol Rx and instead fill only latest metoprolol Rx (25 mg twice daily) for patient ?Follow up with patient to let him know pharmacy will get refill of Rx with correct direction ready for him: metoprolol tartrate 25 mg - 1 tablet twice daily as directed ?Today reports area on his back is now healed, but was unable to finish the last 2 days of course of doxycyline from Richard Oconnell as unable to tolerate (GI side effects) ?Find patient had been taking doxycycline on an empty stomach as reports Richard Oconnell had added an axillary sticker to bottle indicating to be taken on an empty stomach ?Note administration of doxycycline on an empty stomach is generally not recommended due to GI intolerance ? ?Hypertension/ Atrial Fibrillation ?Current treatment: ?Eliquis 5 mg twice daily ?Metoprolol 25 mg twice daily ?valsartan '320mg'$  daily ?Recalls recently blood pressure readings typically running ~120s/70s ?Encourage patient to continue to monitor home BP and HR, keep log of results and to bring this record to medical appointments ? ?Medication Assistance: ?Collaborated with Richard Oconnell for support to patient with re-enrollment in Richard Oconnell patient assistance program for 2023 calendar year ?Have counseled patient that supply of Ozempic  from assistance program should ship ~every 4 months on automatic refill per program. However, advise patient in future if has <1 month supply remaining and has not yet receive a refill, to follow up with office to see if received and if not, to follow up with Richard Oconnell assistance program. ?Also, remind patient that he can follow up with Richard Oconnell assistance program at any time to request date of next shipment ?Patient to contact CM Pharmacist or [facility] Sharee Pimple Oconnell if needing further support  ?Have counseled patient on Eliquis patient assistance program, including income and annual out of pocket expense requirements ?Note patient has previously advised that he has applied for Extra Help subsidy, but does not qualify based on assets ?Patient to follow up with CCM Pharmacist mid-year to discuss further as will be closer to the coverage gap of his health plan at this time. ?  ? ?Patient Goals/Self-Care Activities ?Over the next 90 days, patient will:  ?- check glucose, document, and provide at future appointments ?- check blood pressure, document, and provide at future appointments ?- collaborate with provider on medication  access solutions ?- attend medical appointments as scheduled ?  ?Follow Up Plan:  Telephone follow up appointment with care management team member scheduled for: 11/25/2021 at 1 pm ?  ?  ? ?Wallace Cullens, PharmD, BCACP, CPP ?Clinical Pharmacist ?[facility] ?[place] ?[phone] ? ? ? ? ? ?

## 2021-09-25 NOTE — Patient Instructions (Signed)
Visit Information ? ?Thank you for taking time to visit with me today. Please don't hesitate to contact me if I can be of assistance to you before our next scheduled telephone appointment. ? ?Following are the goals we discussed today:  ? Goals Addressed   ? ?  ?  ?  ?  ? This Visit's Progress  ?  Pharmacy Goals     ?  Our goal bad cholesterol, or LDL, is less than 70 . This is why it is important to continue taking your pravastatin ? ?Please check your home blood pressure, keep a log of the results and bring this with you to your medical appointments. ? ?Feel free to call me with any questions or concerns. I look forward to our next call! ? ? ?Wallace Cullens, PharmD, BCACP ?Clinical Pharmacist ?[facility] ?[place] ?[phone] ?  ? ?  ? ? ? ?Our next appointment is by telephone on 11/25/2021 at 1 pm ? ?Please call the care guide team at [phone] if you need to cancel or reschedule your appointment.  ? ? ?Patient verbalizes understanding of instructions and care plan provided today and agrees to view in [place]. Active MyChart status confirmed with patient.   ? ?

## 2021-10-14 DIAGNOSIS — E1159 Type 2 diabetes mellitus with other circulatory complications: Secondary | ICD-10-CM

## 2021-10-14 DIAGNOSIS — I4891 Unspecified atrial fibrillation: Secondary | ICD-10-CM

## 2021-10-14 DIAGNOSIS — I10 Essential (primary) hypertension: Secondary | ICD-10-CM

## 2021-10-16 ENCOUNTER — Encounter: Payer: Self-pay | Admitting: Medical

## 2021-10-16 ENCOUNTER — Ambulatory Visit: Payer: Medicare Other | Admitting: Medical

## 2021-10-16 VITALS — BP 128/96 | HR 77 | Ht 76.0 in | Wt 276.0 lb

## 2021-10-16 DIAGNOSIS — G4730 Sleep apnea, unspecified: Secondary | ICD-10-CM

## 2021-10-16 DIAGNOSIS — I10 Essential (primary) hypertension: Secondary | ICD-10-CM

## 2021-10-16 DIAGNOSIS — E119 Type 2 diabetes mellitus without complications: Secondary | ICD-10-CM

## 2021-10-16 DIAGNOSIS — I480 Paroxysmal atrial fibrillation: Secondary | ICD-10-CM

## 2021-10-16 NOTE — Patient Instructions (Signed)
Medication Instructions:  - Your physician recommends that you continue on your current medications as directed. Please refer to the Current Medication list given to you today.  *If you need a refill on your cardiac medications before your next appointment, please call your pharmacy*   Lab Work: - none ordered  If you have labs (blood work) drawn today and your tests are completely normal, you will receive your results only by: [place] (if you have MyChart) OR A paper copy in the mail If you have any lab test that is abnormal or we need to change your treatment, we will call you to review the results.   Testing/Procedures: - none ordered   Follow-Up: At [facility], you and your health needs are our priority.  As part of our continuing mission to provide you with exceptional heart care, we have created designated Provider Care Teams.  These Care Teams include your primary Cardiologist (physician) and Advanced Practice Providers (APPs -  Physician Assistants and Nurse Practitioners) who all work together to provide you with the care you need, when you need it.  We recommend signing up for the patient portal called "MyChart".  Sign up information is provided on this After Visit Summary.  MyChart is used to connect with patients for Virtual Visits (Telemedicine).  Patients are able to view lab/test results, encounter notes, upcoming appointments, etc.  Non-urgent messages can be sent to your provider as well.   To learn more about what you can do with MyChart, go to [email or web address].    Your next appointment:   1 year(s)  The format for your next appointment:   In Person  Provider:   You may see Ida Rogue, MD or one of the following Advanced Practice Providers on your designated Care Team:    Cadence Kathlen Mody, [place]    Other Instructions N/a  Important Information About Sugar

## 2021-10-16 NOTE — Progress Notes (Signed)
Cardiology Office Note:    Date:  10/16/2021   ID:  Richard Oconnell, DOB 03/12/1956, MRN [id number]  PCP:  [name], DO  [facility] Cardiologist:  Dr. Ar[facility] Electrophysiologist:  None   Referring MD: Nobie Putnam *   Chief Complaint: 6 month follow-up  History of Present Illness:    Richard Oconnell is a 66 y.o. male with a hx of HTN, DM2, bladder CA, OSA not on CPAP, Pafib on Eliquis who presents for 6 month follow-up.   He presented to [facility] 09/25/2020 with large abdominal abscess and severe septic shock with bacteremia and multiorgan failure, complicated by severe metabolic acidosis and toxic metabolic encephalopathy. Per family, the patient became disoriented and then skin appeared jaundiced, leading him to present to [facility].    He was admitted to [facility] with bacteremia/septic shock related to an abdominal abscess, as well as acute renal failure with ATN, requiring temporary hemodialysis.   Cardiology consulted for possible PAF with [place] not recommended in the setting of high risk for bleeding with liver dysfunction and large abscess. Amiodarone was noted as reportedly discontinued in the setting of junctional rhythm. On review of previous EKGs at time of consultation, the team was able to find evidence of atrial fibrillation from 07/09/2020, though on review of prior to admission medications, he had not been maintained on anticoagulation since Afib EKG on February 2022. He was initially started on amiodarone for his atrial fibrillation; however, this was discontinued due to bradycardia/junctional rhythm.  He was initially not started on any anticoagulation due to his comorbidities at the time of admission and including sepsis, abdominal abscess with drainage, and elevated LFTs.  By discharge, he was anticoagulated with Eliquis.  Due to elevated BP, it was noted that he should start on ACE/ARB if renal function would allow at RTC.   09/27/2020  echo showed EF 60 to 65%, NR WMA, G2 DD, RVSP 21.5 mmHg, and no valve vegetation.   Seen 11/2020 and doing well.  He was screened for sleep apnea and while he did score an adequate amount of points for WatchPat, this was then canceled given he does not have a smart phone.  He was started on valsartan for additional BP support.  Last seen 02/2021 and was stable from a cardiac perspective.   Today, the patient reports he is overall doing well. No changes in medical history. No chest pain or shortness of breath. BP is a little high.BP Log at home shows BP is better 120s/80s. In his job he is traveling a lot, so diet is not good at times. He is active, but does not formal activity. He had labs within the last year. No LLE, orthopnea, pnd, palpitations, lightheadedness or dizziness.   Past Medical History:  Diagnosis Date   Cancer [facility])    Bladder Cancer   Erectile dysfunction associated with type 2 diabetes mellitus ([place]) 05/26/2016   Gross hematuria 11/16/2016   Hypertension 06/11/2015   Type 2 diabetes mellitus with diabetic polyneuropathy, without long-term current use of insulin ([place]) 06/11/2015    Past Surgical History:  Procedure Laterality Date   boil lanceted     hand   COLONOSCOPY WITH PROPOFOL N/A 07/03/2020   Procedure: COLONOSCOPY WITH PROPOFOL;  Surgeon: Jonathon Bellows, MD;  Location: [facility] ENDOSCOPY;  Service: Gastroenterology;  Laterality: N/A;   CYSTOSCOPY W/ RETROGRADES Bilateral 12/06/2016   Procedure: CYSTOSCOPY WITH RETROGRADE PYELOGRAM;  Surgeon: Hollice Espy, MD;  Location: [facility] ORS;  Service: Urology;  Laterality: Bilateral;   CYSTOSCOPY W/ RETROGRADES Bilateral 06/27/2017   Procedure: CYSTOSCOPY WITH RETROGRADE PYELOGRAM;  Surgeon: Hollice Espy, MD;  Location: [facility] ORS;  Service: Urology;  Laterality: Bilateral;   CYSTOSCOPY WITH BIOPSY N/A 06/27/2017   Procedure: CYSTOSCOPY WITH Bladder BIOPSY;  Surgeon: Hollice Espy, MD;  Location: [facility] ORS;  Service: Urology;   Laterality: N/A;   CYSTOSCOPY WITH BIOPSY N/A 10/24/2017   Procedure: CYSTOSCOPY WITH Bladder BIOPSY;  Surgeon: Hollice Espy, MD;  Location: [facility] ORS;  Service: Urology;  Laterality: N/A;   ingrown  Bilateral    ingrown toenail   IR BILIARY DRAIN PLACEMENT WITH CHOLANGIOGRAM  07/10/2020   IR CHOLANGIOGRAM EXISTING TUBE  08/07/2020   IR RADIOLOGIST EVAL & MGMT  08/07/2020   IR RADIOLOGIST EVAL & MGMT  10/29/2020   IR RADIOLOGIST EVAL & MGMT  11/12/2020   TRANSURETHRAL RESECTION OF BLADDER TUMOR N/A 12/06/2016   Procedure: TRANSURETHRAL RESECTION OF BLADDER TUMOR (TURBT) (2-5cm) CLOT EVACUATION;  Surgeon: Hollice Espy, MD;  Location: [facility] ORS;  Service: Urology;  Laterality: N/A;   TRANSURETHRAL RESECTION OF BLADDER TUMOR N/A 06/27/2017   Procedure: TRANSURETHRAL RESECTION OF BLADDER TUMOR (TURBT);  Surgeon: Hollice Espy, MD;  Location: [facility] ORS;  Service: Urology;  Laterality: N/A;   TRANSURETHRAL RESECTION OF BLADDER TUMOR WITH MITOMYCIN-C N/A 01/18/2017   Procedure: TRANSURETHRAL RESECTION OF BLADDER TUMOR WITH MITOMYCIN-C-(SMALL);  Surgeon: Hollice Espy, MD;  Location: [facility] ORS;  Service: Urology;  Laterality: N/A;    Current Medications: Current Meds  Medication Sig   amLODipine (NORVASC) 10 MG tablet Take 1 tablet by mouth daily.   apixaban (ELIQUIS) 5 MG TABS tablet Take 1 tablet (5 mg total) by mouth 2 (two) times daily.   metoprolol tartrate (LOPRESSOR) 25 MG tablet Take 1 tablet (25 mg total) by mouth 2 (two) times daily.   OZEMPIC, 0.25 OR 0.5 MG/DOSE, 2 MG/1.5ML SOPN Inject 1 mg into the skin once a week. (Patient taking differently: Inject 1 mg into the skin every Thursday.)   pravastatin (PRAVACHOL) 20 MG tablet Take 1 tablet (20 mg total) by mouth daily.   sildenafil (REVATIO) 20 MG tablet Take 1-5 pills about 30 min prior to sex. Start with 1 and increase as needed.   valsartan (DIOVAN) 320 MG tablet Take 320 mg by mouth daily.     Allergies:   Cyclobenzaprine and  Lisinopril   Social History   Socioeconomic History   Marital status: Divorced    Spouse name: Not on file   Number of children: Not on file   Years of education: Not on file   Highest education level: Not on file  Occupational History   Not on file  Tobacco Use   Smoking status: Never   Smokeless tobacco: Never   Tobacco comments:    once a while had one cigar >10 years ago  Vaping Use   Vaping Use: Never used  Substance and Sexual Activity   Alcohol use: No    Alcohol/week: 0.0 standard drinks   Drug use: No   Sexual activity: Never  Other Topics Concern   Not on file  Social History Narrative   Lives alone   Social Determinants of Health   Financial Resource Strain: Not on file  Food Insecurity: Not on file  Transportation Needs: Not on file  Physical Activity: Not on file  Stress: Not on file  Social Connections: Not on file     Family History: The patient's family history includes Heart attack in his maternal  aunt, maternal grandfather, maternal grandmother, and mother; Heart disease in his maternal grandfather and mother; Parkinson's disease in his father. There is no history of Prostate cancer, Bladder Cancer, or Kidney cancer.  ROS:   Please see the history of present illness.     All other systems reviewed and are negative.  EKGs/Labs/Other Studies Reviewed:    The following studies were reviewed today:  Echo 09/27/20 . Left ventricular ejection fraction, by estimation, is 60 to 65%. The  left ventricle has normal function. The left ventricle has no regional  wall motion abnormalities. Left ventricular diastolic parameters are  consistent with Grade II diastolic  dysfunction (pseudonormalization).   2. Right ventricular systolic function is normal. The right ventricular  size is normal. There is normal pulmonary artery systolic pressure. The  estimated right ventricular systolic pressure is 34.7 mmHg.   3. The mitral valve is normal in structure. No  evidence of mitral valve  regurgitation. No evidence of mitral stenosis.   4. No valve vegetation.   EKG:  EKG is  ordered today.  The ekg ordered today demonstrates NSR 1st degree AV block, 77bpm, LAD, no changes   Recent Labs: 10/30/2020: ALT 28 03/10/2021: BUN 22; Creatinine, Ser 1.17; Hemoglobin 15.3; Platelets 300; Potassium 4.7; Sodium 139  Recent Lipid Panel    Component Value Date/Time   CHOL 105 07/21/2020 1108   TRIG 226 (H) 09/28/2020 0418   HDL 33 (L) 07/21/2020 1108   CHOLHDL 3.2 07/21/2020 1108   VLDL 51 (H) 11/24/2016 0818   LDLCALC 45 07/21/2020 1108      Physical Exam:    VS:  BP (!) 128/96 (BP Location: Left Arm, Patient Position: Sitting, Cuff Size: Normal)   Pulse 77   Ht '6\' 4"'$  (1.93 m)   Wt 276 lb (125.2 kg)   SpO2 96%   BMI 33.60 kg/m     Wt Readings from Last 3 Encounters:  10/16/21 276 lb (125.2 kg)  09/14/21 277 lb 6.4 oz (125.8 kg)  05/29/21 277 lb 12.8 oz (126 kg)     GEN:  Well nourished, well developed in no acute distress HEENT: Normal NECK: No JVD; No carotid bruits LYMPHATICS: No lymphadenopathy CARDIAC: RRR, no murmurs, rubs, gallops RESPIRATORY:  Clear to auscultation without rales, wheezing or rhonchi  ABDOMEN: Soft, non-tender, non-distended MUSCULOSKELETAL:  No edema; No deformity  SKIN: Warm and dry NEUROLOGIC:  Alert and oriented x 3 PSYCHIATRIC:  Normal affect   ASSESSMENT:    1. Paroxysmal A-fib ([place])   2. Diabetes mellitus with coincident hypertension ([place])   3. Essential hypertension   4. Sleep disorder breathing    PLAN:    In order of problems listed above:  Paroxysmal Afib EKG today showed NSR. Continue stroke ppx with Eliquis. Continue rate control with Lopressor. He had labs within the last year.   HTN BP today mildly elevated, however he reports it's better at home. Continue Lopressor '25mg'$  BID, Valsartan '320mg'$  and amlodipine '10mg'$  daily. I encouraged healthy lifestyle changes.   OSA He reports he does  not need a CPAP regularly.   DM2 A1C 6.5. Followed by PCP.   Disposition: Follow up in 1 year(s) with MD/APP    Signed, Patton Swisher Ninfa Meeker, PA-C  10/16/2021 11:49 AM    [facility]

## 2021-11-25 ENCOUNTER — Ambulatory Visit (INDEPENDENT_AMBULATORY_CARE_PROVIDER_SITE_OTHER): Payer: Medicare Other | Admitting: Pharmacist

## 2021-11-25 DIAGNOSIS — I480 Paroxysmal atrial fibrillation: Secondary | ICD-10-CM

## 2021-11-25 DIAGNOSIS — E1142 Type 2 diabetes mellitus with diabetic polyneuropathy: Secondary | ICD-10-CM

## 2021-11-25 DIAGNOSIS — I10 Essential (primary) hypertension: Secondary | ICD-10-CM

## 2021-11-25 NOTE — Chronic Care Management (AMB) (Signed)
Chronic Care Management CCM Pharmacy Note  11/25/2021 Name:  Richard Oconnell MRN:  [id number] DOB:  29-Dec-1955   Subjective: Richard Oconnell is an 66 y.o. year old male who is a primary patient of [name], DO.  The CCM team was consulted for assistance with disease management and care coordination needs.    Engaged with patient by telephone for follow up visit for pharmacy case management and/or care coordination services.   Objective:  Medications Reviewed Today     Reviewed by Rennis Petty, RPH-CPP (Pharmacist) on 11/25/21 at 1532  Med List Status: <None>   Medication Order Taking? Sig Documenting Provider Last Dose Status Informant  apixaban (ELIQUIS) 5 MG TABS tablet [id number] Yes Take 1 tablet (5 mg total) by mouth 2 (two) times daily. [name], DO Taking Active   metoprolol tartrate (LOPRESSOR) 25 MG tablet [id number] Yes Take 1 tablet (25 mg total) by mouth 2 (two) times daily. [name], DO Taking Active   OZEMPIC, 0.25 OR 0.5 MG/DOSE, 2 MG/1.5ML SOPN [id number] Yes Inject 1 mg into the skin once a week.  Patient taking differently: Inject 1 mg into the skin every Thursday.   [name], DO Taking Active            Med Note Kellie Simmering, Jenene Slicker   Fri Aug 15, 2020  2:42 PM)    pravastatin (PRAVACHOL) 20 MG tablet [id number]  Take 1 tablet (20 mg total) by mouth daily. [name], DO  Active   sildenafil (REVATIO) 20 MG tablet [id number]  Take 1-5 pills about 30 min prior to sex. Start with 1 and increase as needed. Karamalegos, Devonne Doughty, DO  Active   valsartan (DIOVAN) 320 MG tablet [id number] Yes Take 320 mg by mouth daily. [provider] Taking Active             Pertinent Labs:  Lab Results  Component Value Date   HGBA1C 6.5 (A) 06/01/2021   Lab Results  Component Value Date   CHOL 105 07/21/2020   HDL 33 (L) 07/21/2020   LDLCALC 45 07/21/2020   TRIG 226 (H)  09/28/2020   CHOLHDL 3.2 07/21/2020   Lab Results  Component Value Date   CREATININE 1.17 03/10/2021   BUN 22 03/10/2021   NA 139 03/10/2021   K 4.7 03/10/2021   CL 103 03/10/2021   CO2 21 03/10/2021   BP Readings from Last 3 Encounters:  10/16/21 (!) 128/96  09/14/21 132/86  05/29/21 116/79   Pulse Readings from Last 3 Encounters:  10/16/21 77  09/14/21 77  05/29/21 85    SDOH:  (Social Determinants of Health) assessments and interventions performed:    [place]  Review of patient past medical history, allergies, medications, health status, including review of consultants reports, laboratory and other test data, was performed as part of comprehensive evaluation and provision of chronic care management services.   Care Plan : PharmD - Medication Assistance  Updates made by Rennis Petty, RPH-CPP since 11/25/2021 12:00 AM     Problem: Disease Progression      Long-Range Goal: Disease Progression Prevented or Minimized   Start Date: 06/06/2020  Expected End Date: 09/04/2020  Recent Progress: On track  Priority: High  Note:   Current Barriers:  Difficulty with independently affording treatment regimen. Reports cost of Ozempic is difficult to afford, particularly when enters coverage gap of [name] Patient APPROVED for patient assistance from  [employer] through 05/16/2022  Pharmacist Clinical Goal(s):  Over the next 90 days, patient will verbalize ability to afford treatment regimen through collaboration with PharmD and provider.   Interventions: 1:1 collaboration with [name], DO regarding development and update of comprehensive plan of care as evidenced by provider attestation and co-signature Inter-disciplinary care team collaboration (see longitudinal plan of care) Perform chart review. Patient seen for Office Visit with [facility] on 10/16/2021 Today patient reports that he is doing well  Hypertension/  Atrial Fibrillation Current treatment: Eliquis 5 mg twice daily Metoprolol 25 mg twice daily valsartan '320mg'$  daily Unable to review blood pressure readings as patient not currently home, but recalls last checked on 7/10 reading: ~126/73 From review of encounter with [facility] on 10/16/2021, note provider advised patient to "Continue Lopressor '25mg'$  BID, Valsartan '320mg'$  and amlodipine '10mg'$  daily" Today patient denies having taken amlodipine in ~ 1 year From review of chart note on 12/10/2020 Nephrologist at [facility] advised patient to Stop amlodipine 10 mg daily and Start valsartan 320 mg daily. From review of dispensing history, pharmacy has not refilled amlodipine Rx since July 2022 Will send [facility] message to Cardiologist PA [place] today to confirm whether patient to remain off of amlodipine or to restart this medication Encourage patient to continue to monitor home BP and HR, keep log of results and to bring this record to medical appointments  Type 2 Diabetes: Controlled; current treatment: Ozempic '1mg'$  weekly Recalls last checked blood sugar last week: morning fasting reading ~120 Encourage patient to have regular well-balanced meals throughout the day, while limiting carbohydrate portion sizes  Medication Assistance: Collaborated with Milam Simcox for support to patient with re-enrollment in [name] patient assistance program for 2023 calendar year Reports last received shipment of 6-monthsupply of Ozempic from program 2 weeks ago Have counseled patient on Eliquis patient assistance program, including income and annual out of pocket expense requirements Note patient has previously advised that he has applied for Extra Help subsidy, but does not qualify based on assets    Patient Goals/Self-Care Activities Over the next 90 days, patient will:  - check glucose, document, and provide at future appointments - check blood  pressure, document, and provide at future appointments - collaborate with provider on medication access solutions - attend medical appointments as scheduled   Follow Up Plan:  Telephone follow up appointment with care management team member scheduled for: 03/22/2022 at 1:45 PM      EWallace Cullens PharmD, [phone]

## 2021-11-25 NOTE — Patient Instructions (Signed)
Visit Information  Thank you for taking time to visit with me today. Please don't hesitate to contact me if I can be of assistance to you before our next scheduled telephone appointment.  Following are the goals we discussed today:   Goals Addressed             This Visit's Progress    Pharmacy Goals       Our goal bad cholesterol, or LDL, is less than 70 . This is why it is important to continue taking your pravastatin  Please check your home blood pressure, keep a log of the results and bring this with you to your medical appointments.  Feel free to call me with any questions or concerns. I look forward to our next call!   Wallace Cullens, PharmD, [place] [phone]         Our next appointment is by telephone on 03/22/2022 at 1:45 PM  Please call the care guide team at [phone] if you need to cancel or reschedule your appointment.    Patient verbalizes understanding of instructions and care plan provided today and agrees to view in [place]. Active MyChart status and patient understanding of how to access instructions and care plan via MyChart confirmed with patient.

## 2021-12-14 DIAGNOSIS — I480 Paroxysmal atrial fibrillation: Secondary | ICD-10-CM

## 2021-12-14 DIAGNOSIS — I10 Essential (primary) hypertension: Secondary | ICD-10-CM | POA: Diagnosis not present

## 2021-12-14 DIAGNOSIS — E1142 Type 2 diabetes mellitus with diabetic polyneuropathy: Secondary | ICD-10-CM

## 2021-12-30 ENCOUNTER — Encounter: Payer: Self-pay | Admitting: Family Medicine

## 2021-12-30 DIAGNOSIS — L600 Ingrowing nail: Secondary | ICD-10-CM

## 2022-01-04 ENCOUNTER — Ambulatory Visit: Payer: Medicare Other | Admitting: Podiatry

## 2022-01-04 ENCOUNTER — Encounter: Payer: Self-pay | Admitting: Podiatry

## 2022-01-04 DIAGNOSIS — B351 Tinea unguium: Secondary | ICD-10-CM

## 2022-01-04 DIAGNOSIS — L600 Ingrowing nail: Secondary | ICD-10-CM

## 2022-01-04 DIAGNOSIS — E1142 Type 2 diabetes mellitus with diabetic polyneuropathy: Secondary | ICD-10-CM | POA: Diagnosis not present

## 2022-01-04 MED ORDER — CICLOPIROX 8 % EX SOLN: Freq: Every day | CUTANEOUS | 3 refills | Status: DC

## 2022-01-05 ENCOUNTER — Telehealth: Payer: Self-pay | Admitting: Family Medicine

## 2022-01-05 ENCOUNTER — Encounter: Payer: Self-pay | Admitting: Family Medicine

## 2022-01-05 DIAGNOSIS — I10 Essential (primary) hypertension: Secondary | ICD-10-CM

## 2022-01-05 DIAGNOSIS — I480 Paroxysmal atrial fibrillation: Secondary | ICD-10-CM

## 2022-01-05 NOTE — Telephone Encounter (Signed)
Pt called, left VM to call to schedule physical appt for medication refill.

## 2022-01-05 NOTE — Telephone Encounter (Signed)
Pt made office visit for 8/30, wants to know if can get enough meds to last till appt.FU [phone]

## 2022-01-05 NOTE — Progress Notes (Signed)
  Subjective:  [name], male    DOB: 1955-06-25,  MRN: [id number]  Chief Complaint  Patient presents with   Nail Problem    "My toenail is ingrown.  I'm diabetic." N - ingrown toenail L - hallux right D - 4 weeks O - gradually worse C - burning, sharp pain A - bump it T - none    66 y.o. male presents with the above complaint. History confirmed with patient.   Objective:  Physical Exam: warm, good capillary refill, no trophic changes or ulcerative lesions, normal DP and PT pulses, normal sensory exam, and yellow discolored mycotic right hallux nail with ingrowing lateral border.  Assessment:   1. Ingrowing right great toenail   2. Onychomycosis   3. Type 2 diabetes mellitus with diabetic polyneuropathy, without long-term current use of insulin ([place])      Plan:  Patient was evaluated and treated and all questions answered.  Discussed etiology and treatment options for both onychomycosis as well as ingrown nails.  Currently his ingrown nail is primarily at the tip of the toe and there is no deep ingrown paronychia or pain at the base of the toenail.  I recommended debridement of the nail today and this was done with a sharp nail nipper to a tolerable level.  Seem to alleviate all the pain and pressure.  Did not require partial permanent matricectomy but this is an option for the future.  He is on Eliquis I do not think he is a great candidate for oral therapy, pulse fluconazole may be an option but for now would recommend topical administration with ciclopirox.  Rx was sent to pharmacy  Return if symptoms worsen or fail to improve.

## 2022-01-05 NOTE — Telephone Encounter (Signed)
Pt is due for physical appt. Called and LVM Requested Prescriptions  Pending Prescriptions Disp Refills  . metoprolol tartrate (LOPRESSOR) 25 MG tablet [Pharmacy Med Name: METOPROLOL TARTRATE 25 MG TAB] 60 tablet 0    Sig: Take 1 tablet (25 mg total) by mouth 2 (two) times daily.     Cardiovascular:  Beta Blockers Failed - 01/05/2022  9:01 AM      Failed - Last BP in normal range    BP Readings from Last 1 Encounters:  10/16/21 (!) 128/96         Passed - Last Heart Rate in normal range    Pulse Readings from Last 1 Encounters:  10/16/21 77         Passed - Valid encounter within last 6 months    Recent Outpatient Visits          3 months ago Epidermoid cyst of skin of back   [place], DO   7 months ago Type 2 diabetes mellitus with diabetic polyneuropathy, without long-term current use of insulin [facility])   [facility] [name], DO   1 year ago Intra-abdominal abscess post-procedure   [place], DO   1 year ago Pre-op evaluation   [place], DO   1 year ago Acute cholecystitis   [place], [place]

## 2022-01-07 ENCOUNTER — Other Ambulatory Visit: Payer: Self-pay

## 2022-01-07 DIAGNOSIS — I10 Essential (primary) hypertension: Secondary | ICD-10-CM

## 2022-01-07 DIAGNOSIS — I480 Paroxysmal atrial fibrillation: Secondary | ICD-10-CM

## 2022-01-07 MED ORDER — METOPROLOL TARTRATE 25 MG PO TABS: 25.0000 mg | ORAL_TABLET | Freq: Two times a day (BID) | ORAL | 0 refills | Status: DC

## 2022-01-07 NOTE — Telephone Encounter (Signed)
Sent to [name]

## 2022-01-13 ENCOUNTER — Encounter: Payer: Self-pay | Admitting: Family Medicine

## 2022-01-13 ENCOUNTER — Ambulatory Visit (INDEPENDENT_AMBULATORY_CARE_PROVIDER_SITE_OTHER): Payer: Medicare Other | Admitting: Family Medicine

## 2022-01-13 VITALS — BP 121/82 | HR 64 | Ht 76.0 in | Wt 277.8 lb

## 2022-01-13 DIAGNOSIS — Z23 Encounter for immunization: Secondary | ICD-10-CM

## 2022-01-13 DIAGNOSIS — L729 Follicular cyst of the skin and subcutaneous tissue, unspecified: Secondary | ICD-10-CM

## 2022-01-13 DIAGNOSIS — E1142 Type 2 diabetes mellitus with diabetic polyneuropathy: Secondary | ICD-10-CM

## 2022-01-13 DIAGNOSIS — I10 Essential (primary) hypertension: Secondary | ICD-10-CM

## 2022-01-13 DIAGNOSIS — L720 Epidermal cyst: Secondary | ICD-10-CM

## 2022-01-13 DIAGNOSIS — E1169 Type 2 diabetes mellitus with other specified complication: Secondary | ICD-10-CM

## 2022-01-13 DIAGNOSIS — E785 Hyperlipidemia, unspecified: Secondary | ICD-10-CM

## 2022-01-13 DIAGNOSIS — I480 Paroxysmal atrial fibrillation: Secondary | ICD-10-CM

## 2022-01-13 DIAGNOSIS — L089 Local infection of the skin and subcutaneous tissue, unspecified: Secondary | ICD-10-CM

## 2022-01-13 MED ORDER — PRAVASTATIN SODIUM 20 MG PO TABS: 20.0000 mg | ORAL_TABLET | Freq: Every day | ORAL | 3 refills | Status: DC

## 2022-01-13 MED ORDER — AMOXICILLIN-POT CLAVULANATE 875-125 MG PO TABS: 1.0000 | ORAL_TABLET | Freq: Two times a day (BID) | ORAL | 0 refills | Status: DC

## 2022-01-13 MED ORDER — METOPROLOL TARTRATE 25 MG PO TABS: 25.0000 mg | ORAL_TABLET | Freq: Two times a day (BID) | ORAL | 3 refills | Status: DC

## 2022-01-13 NOTE — Patient Instructions (Addendum)
Thank you for coming to the office today.  Recent Labs    06/01/21 1130  HGBA1C 6.5*   Continue Ozempic '1mg'$  weekly injection  Prevnar-20 final dose pneumonia vaccine today.  BP is controlled  Refilled medications with Cholesterol med and Metoprolol for 90 day supply  Printed Augmentin antibiotic for the cysts if you need to take it, finish whole 10 days full course.  DUE for FASTING BLOOD WORK (no food or drink after midnight before the lab appointment, only water or coffee without cream/sugar on the morning of)  SCHEDULE "Lab Only" visit in the morning at the clinic for lab draw in 6 MONTHS   - Make sure Lab Only appointment is at about 1 week before your next appointment, so that results will be available  For Lab Results, once available within 2-3 days of blood draw, you can can log in to MyChart online to view your results and a brief explanation. Also, we can discuss results at next follow-up visit.   Please schedule a Follow-up Appointment to: Return in about 6 months (around 07/15/2022) for 6 month fasting lab only then 1 week later Annual Physical.  If you have any other questions or concerns, please feel free to call the office or send a message through [place]. You may also schedule an earlier appointment if necessary.  Additionally, you may be receiving a survey about your experience at our office within a few days to 1 week by e-mail or mail. We value your feedback.  Nobie Putnam, DO [place]

## 2022-01-13 NOTE — Progress Notes (Unsigned)
Subjective:    [name], male    DOB: March 22, 1956, 66 y.o.   MRN: [id number]  Richard Oconnell is a 66 y.o. male presenting on 01/13/2022 for Hypertension, Hyperlipidemia, and Diabetes   HPI  Epidermal Cysts, multiple He had 4-5 doxycycline remaining from previous rx for skin cyst He has multiple locations with larger firm cysts 3 x on back and 1 x on R chest He was seen for this back in 09/2021, given rx Doxycycline and skin care instructions, and he said some has drained, he only took half course, and then recently repeated last 5 days of medication it has improved, some drainage now. Not seen Gen Surgery recently He has had prior cyst removed years ago.  Paroxysmal Atrial Fibrillation Followed by cardiology On anticoagulation, BB for rate Has done well Goal to come off medication He feels no further episodes of AFib   CHRONIC DM, Type 2 with neuropathy / Nephropathy HTN Followed by Dr Juleen [place] Neph[facility] Currently doing well on Novo Nordisk PAP Meds: Ozempic '1mg'$  weekly injection - OFF Metformin - Significant improved diet On ARB Denies any hypoglycemia, polyuria, visual changes, persistent numbness tingling      01/13/2022   11:46 AM 05/29/2021    2:15 PM 10/31/2020   10:32 AM  Depression screen PHQ 2/9  Decreased Interest 0 1 0  Down, Depressed, Hopeless 0 1 0  PHQ - 2 Score 0 2 0  Altered sleeping 3 0 0  Tired, decreased energy 0 0 0  Change in appetite 0 0 0  Feeling bad or failure about yourself  0 0 0  Trouble concentrating 0 0 0  Moving slowly or fidgety/restless 0 0 0  Suicidal thoughts 0 0 0  PHQ-9 Score 3 2 0  Difficult doing work/chores Not difficult at all Somewhat difficult Not difficult at all    Social History   Tobacco Use   Smoking status: Former    Types: Cigars   Smokeless tobacco: Never   Tobacco comments:    once a while had one cigar >10 years ago  Vaping Use   Vaping Use: Never used  Substance Use Topics    Alcohol use: No    Alcohol/week: 0.0 standard drinks of alcohol   Drug use: No    Review of Systems Per HPI unless specifically indicated above     Objective:    BP 121/82   Pulse 64   Ht '6\' 4"'$  (1.93 m)   Wt 277 lb 12.8 oz (126 kg)   SpO2 97%   BMI 33.81 kg/m   Wt Readings from Last 3 Encounters:  01/13/22 277 lb 12.8 oz (126 kg)  10/16/21 276 lb (125.2 kg)  09/14/21 277 lb 6.4 oz (125.8 kg)    Physical Exam Vitals and nursing note reviewed.  Constitutional:      General: He is not in acute distress.    Appearance: He is well-developed. He is obese. He is not diaphoretic.     Comments: Well-appearing, comfortable, cooperative  HENT:     Head: Normocephalic and atraumatic.  Eyes:     General:        Right eye: No discharge.        Left eye: No discharge.     Conjunctiva/sclera: Conjunctivae normal.  Neck:     Thyroid: No thyromegaly.  Cardiovascular:     Rate and Rhythm: Normal rate and regular rhythm.     Pulses: Normal pulses.  Heart sounds: Normal heart sounds. No murmur heard. Pulmonary:     Effort: Pulmonary effort is normal. No respiratory distress.     Breath sounds: Normal breath sounds. No wheezing or rales.  Musculoskeletal:        General: Normal range of motion.     Cervical back: Normal range of motion and neck supple.  Lymphadenopathy:     Cervical: No cervical adenopathy.  Skin:    General: Skin is warm and dry.     Findings: Lesion (multiple moderate sized epidermal cysts 1 anterior R chest, and x 3 on back. some mild erythema of one on anterior) present. No erythema or rash.  Neurological:     Mental Status: He is alert and oriented to person, place, and time. Mental status is at baseline.  Psychiatric:        Behavior: Behavior normal.     Comments: Well groomed, good eye contact, normal speech and thoughts     Diabetic Foot Exam - Simple   Simple Foot Form Diabetic Foot exam was performed with the following findings: Yes 01/13/2022  12:05 PM  Visual Inspection See comments: Yes Sensation Testing See comments: Yes Pulse Check Posterior Tibialis and Dorsalis pulse intact bilaterally: Yes Comments Bilateral feet with callus formation and neuropathy reduced monofilament sensation. Partial toenail removal from podiatry.      Results for orders placed or performed in visit on 01/13/22  POCT glycosylated hemoglobin (Hb A1C)  Result Value Ref Range   Hemoglobin A1C 7.3 (A) 4.0 - 5.6 %      Assessment & Plan:   Problem List Items Addressed This Visit     Atrial fibrillation (HCC)   Relevant Medications   metoprolol tartrate (LOPRESSOR) 25 MG tablet   pravastatin (PRAVACHOL) 20 MG tablet   Hyperlipidemia associated with type 2 diabetes mellitus (HCC)   Relevant Medications   metoprolol tartrate (LOPRESSOR) 25 MG tablet   pravastatin (PRAVACHOL) 20 MG tablet   Type 2 diabetes mellitus with diabetic polyneuropathy, without long-term current use of insulin (HCC) - Primary   Relevant Medications   pravastatin (PRAVACHOL) 20 MG tablet   Other Relevant Orders   POCT glycosylated hemoglobin (Hb A1C) (Completed)   Other Visit Diagnoses     Essential hypertension       Relevant Medications   metoprolol tartrate (LOPRESSOR) 25 MG tablet   pravastatin (PRAVACHOL) 20 MG tablet   Epidermal cyst       Relevant Medications   amoxicillin-clavulanate (AUGMENTIN) 875-125 MG tablet   Infected cyst of skin       Relevant Medications   amoxicillin-clavulanate (AUGMENTIN) 875-125 MG tablet   Need for pneumococcal vaccine           A1c 7.3, mild elevated. Controlled overall.  Continue Ozempic '1mg'$  weekly injection  Prevnar-20 final dose pneumonia vaccine today.  BP is controlled  Refilled medications with Cholesterol med and Metoprolol for 90 day supply  Multiple epidermal cysts Offer referral to General Surgery for excision, definitive treatment, he declines at this time. Printed Augmentin antibiotic for the  cysts if you need to take it, finish whole 10 days full course.   Orders Placed This Encounter  Procedures   POCT glycosylated hemoglobin (Hb A1C)     Meds ordered this encounter  Medications   amoxicillin-clavulanate (AUGMENTIN) 875-125 MG tablet    Sig: Take 1 tablet by mouth 2 (two) times daily.    Dispense:  20 tablet    Refill:  0   metoprolol  tartrate (LOPRESSOR) 25 MG tablet    Sig: Take 1 tablet (25 mg total) by mouth 2 (two) times daily.    Dispense:  180 tablet    Refill:  3   pravastatin (PRAVACHOL) 20 MG tablet    Sig: Take 1 tablet (20 mg total) by mouth daily.    Dispense:  90 tablet    Refill:  3    Follow up plan: Return in about 6 months (around 07/15/2022) for 6 month fasting lab only then 1 week later Annual Physical.  Future labs ordered for 06/2022  Nobie Putnam, [facility] 01/13/2022, 11:48 AM

## 2022-01-14 ENCOUNTER — Other Ambulatory Visit: Payer: Self-pay | Admitting: Family Medicine

## 2022-01-14 DIAGNOSIS — E1142 Type 2 diabetes mellitus with diabetic polyneuropathy: Secondary | ICD-10-CM

## 2022-01-14 DIAGNOSIS — E1169 Type 2 diabetes mellitus with other specified complication: Secondary | ICD-10-CM

## 2022-01-14 DIAGNOSIS — I480 Paroxysmal atrial fibrillation: Secondary | ICD-10-CM

## 2022-01-14 DIAGNOSIS — Z0000 Encounter for general adult medical examination without abnormal findings: Secondary | ICD-10-CM

## 2022-01-14 DIAGNOSIS — I10 Essential (primary) hypertension: Secondary | ICD-10-CM

## 2022-01-14 DIAGNOSIS — R351 Nocturia: Secondary | ICD-10-CM

## 2022-01-14 LAB — POCT GLYCOSYLATED HEMOGLOBIN (HGB A1C): Hemoglobin A1C: 7.3 % — AB (ref 4.0–5.6)

## 2022-01-25 ENCOUNTER — Encounter: Payer: Self-pay | Admitting: Family Medicine

## 2022-01-25 DIAGNOSIS — L720 Epidermal cyst: Secondary | ICD-10-CM

## 2022-01-28 ENCOUNTER — Other Ambulatory Visit: Payer: Self-pay

## 2022-01-28 ENCOUNTER — Encounter: Payer: Self-pay | Admitting: Surgery

## 2022-01-28 ENCOUNTER — Ambulatory Visit: Payer: Medicare Other | Admitting: Surgery

## 2022-01-28 VITALS — BP 116/76 | HR 64 | Temp 98.1°F | Ht 76.0 in | Wt 275.4 lb

## 2022-01-28 DIAGNOSIS — L03312 Cellulitis of back [any part except buttock]: Secondary | ICD-10-CM | POA: Diagnosis not present

## 2022-01-28 MED ORDER — AMOXICILLIN-POT CLAVULANATE 875-125 MG PO TABS: 1.0000 | ORAL_TABLET | Freq: Two times a day (BID) | ORAL | 0 refills | Status: DC

## 2022-01-28 NOTE — Patient Instructions (Addendum)
Stop the Eliquis on Saturday.  You may try applying warm compresses to the area.   Pick up your medication at the pharmacy.    Epidermoid Cyst  An epidermoid cyst, also known as epidermal cyst, is a sac made of skin tissue. The sac contains a substance called keratin. Keratin is a protein that is normally secreted through the hair follicles. When keratin becomes trapped in the top layer of skin (epidermis), it can form an epidermoid cyst. Epidermoid cysts can be found anywhere on your body. These cysts are usually harmless (benign), and they may not cause symptoms unless they become inflamed or infected. What are the causes? This condition may be caused by: A blocked hair follicle. A hair that curls and re-enters the skin instead of growing straight out of the skin (ingrown hair). A blocked pore. Irritated skin. An injury to the skin. Certain conditions that are passed along from parent to child (inherited). Human papillomavirus (HPV). This happens rarely when cysts occur on the bottom of the feet. Long-term (chronic) sun damage to the skin. What increases the risk? The following factors may make you more likely to develop an epidermoid cyst: Having acne. Being male. Having an injury to the skin. Being past puberty. Having certain rare genetic disorders. What are the signs or symptoms? The only symptom of this condition may be a small, painless lump underneath the skin. When an epidermal cyst ruptures, it may become inflamed. True infection in cysts is rare. Symptoms may include: Redness. Inflammation. Tenderness. Warmth. Keratin draining from the cyst. Keratin is grayish-white, bad-smelling substance. Pus draining from the cyst. How is this diagnosed? This condition is diagnosed with a physical exam. In some cases, you may have a sample of tissue (biopsy) taken from your cyst to be examined under a microscope or tested for bacteria. You may be referred to a health care provider  who specializes in skin care (dermatologist). How is this treated? If a cyst becomes inflamed, treatment may include: Opening and draining the cyst, done by a health care provider. After draining, minor surgery to remove the rest of the cyst may be done. Taking antibiotic medicine. Having injections of medicines (steroids) that help to reduce inflammation. Having surgery to remove the cyst. Surgery may be done if the cyst: Becomes large. Bothers you. Has a chance of turning into cancer. Do not try to open a cyst yourself. Follow these instructions at home: Medicines If you were prescribed an antibiotic medicine, take it it as told by your health care provider. Do not stop using the antibiotic even if you start to feel better. Take over-the-counter and prescription medicines only as told by your health care provider. General instructions Keep the area around your cyst clean and dry. Wear loose, dry clothing. Avoid touching your cyst. Check your cyst every day for signs of infection. Check for: Redness, swelling, or pain. Fluid or blood. Warmth. Pus or a bad smell. Keep all follow-up visits. This is important. How is this prevented? Wear clean, dry, clothing. Avoid wearing tight clothing. Keep your skin clean and dry. Take showers or baths every day. Contact a health care provider if: Your cyst develops symptoms of infection. Your condition is not improving or is getting worse. You develop a cyst that looks different from other cysts you have had. You have a fever. Get help right away if: Redness spreads from the cyst into the surrounding area. Summary An epidermoid cyst is a sac made of skin tissue. These cysts are  usually harmless (benign), and they may not cause symptoms unless they become inflamed. If a cyst becomes inflamed, treatment may include surgery to open and drain the cyst, or to remove it. Treatment may also include medicines by mouth or through an injection. Take  over-the-counter and prescription medicines only as told by your health care provider. If you were prescribed an antibiotic medicine, take it as told by your health care provider. Do not stop using the antibiotic even if you start to feel better. Contact a health care provider if your condition is not improving or is getting worse. Keep all follow-up visits as told by your health care provider. This is important. This information is not intended to replace advice given to you by your health care provider. Make sure you discuss any questions you have with your health care provider. Document Revised: 08/08/2019 Document Reviewed: 08/08/2019 Elsevier Patient Education  [place].

## 2022-01-28 NOTE — Progress Notes (Signed)
[name], male   DOB: [birth year], 66 y.o.   MRN: [id number]  Chief Complaint: Inflamed dermal cyst mid back.  History of Present Illness Georgios Oconnell is a 66 y.o. male with known history of dermal cysts, mid back inflammatory changes present about 2 weeks, initially on doxycycline, now current taking Augmentin.  Previously treated with doxycycline back in May.  No current drainage, denies prior incision and drainage.  Denies fevers and chills, reports good blood sugars in the 120s/130s range with a recent hemoglobin A1c of 7.4. Has been taking his Eliquis right up to today.  Past Medical History Past Medical History:  Diagnosis Date   Cancer [facility])    Bladder Cancer   Erectile dysfunction associated with type 2 diabetes mellitus ([place]) 05/26/2016   Gross hematuria 11/16/2016   Hypertension 06/11/2015   Type 2 diabetes mellitus with diabetic polyneuropathy, without long-term current use of insulin ([place]) 06/11/2015      Past Surgical History:  Procedure Laterality Date   boil lanceted     hand   COLONOSCOPY WITH PROPOFOL N/A 07/03/2020   Procedure: COLONOSCOPY WITH PROPOFOL;  Surgeon: Jonathon Bellows, MD;  Location: [facility] ENDOSCOPY;  Service: Gastroenterology;  Laterality: N/A;   CYSTOSCOPY W/ RETROGRADES Bilateral 12/06/2016   Procedure: CYSTOSCOPY WITH RETROGRADE PYELOGRAM;  Surgeon: Hollice Espy, MD;  Location: [facility] ORS;  Service: Urology;  Laterality: Bilateral;   CYSTOSCOPY W/ RETROGRADES Bilateral 06/27/2017   Procedure: CYSTOSCOPY WITH RETROGRADE PYELOGRAM;  Surgeon: Hollice Espy, MD;  Location: [facility] ORS;  Service: Urology;  Laterality: Bilateral;   CYSTOSCOPY WITH BIOPSY N/A 06/27/2017   Procedure: CYSTOSCOPY WITH Bladder BIOPSY;  Surgeon: Hollice Espy, MD;  Location: [facility] ORS;  Service: Urology;  Laterality: N/A;   CYSTOSCOPY WITH BIOPSY N/A 10/24/2017   Procedure: CYSTOSCOPY WITH Bladder BIOPSY;  Surgeon: Hollice Espy, MD;  Location: [facility] ORS;   Service: Urology;  Laterality: N/A;   ingrown  Bilateral    ingrown toenail   IR BILIARY DRAIN PLACEMENT WITH CHOLANGIOGRAM  07/10/2020   IR CHOLANGIOGRAM EXISTING TUBE  08/07/2020   IR RADIOLOGIST EVAL & MGMT  08/07/2020   IR RADIOLOGIST EVAL & MGMT  10/29/2020   IR RADIOLOGIST EVAL & MGMT  11/12/2020   TRANSURETHRAL RESECTION OF BLADDER TUMOR N/A 12/06/2016   Procedure: TRANSURETHRAL RESECTION OF BLADDER TUMOR (TURBT) (2-5cm) CLOT EVACUATION;  Surgeon: Hollice Espy, MD;  Location: [facility] ORS;  Service: Urology;  Laterality: N/A;   TRANSURETHRAL RESECTION OF BLADDER TUMOR N/A 06/27/2017   Procedure: TRANSURETHRAL RESECTION OF BLADDER TUMOR (TURBT);  Surgeon: Hollice Espy, MD;  Location: [facility] ORS;  Service: Urology;  Laterality: N/A;   TRANSURETHRAL RESECTION OF BLADDER TUMOR WITH MITOMYCIN-C N/A 01/18/2017   Procedure: TRANSURETHRAL RESECTION OF BLADDER TUMOR WITH MITOMYCIN-C-(SMALL);  Surgeon: Hollice Espy, MD;  Location: [facility] ORS;  Service: Urology;  Laterality: N/A;    Allergies  Allergen Reactions   Cyclobenzaprine Other (See Comments)    Not improving pain and makes him agressive.   Lisinopril Cough    Current Outpatient Medications  Medication Sig Dispense Refill   amoxicillin-clavulanate (AUGMENTIN) 875-125 MG tablet Take 1 tablet by mouth 2 (two) times daily. 14 tablet 0   apixaban (ELIQUIS) 5 MG TABS tablet Take 1 tablet (5 mg total) by mouth 2 (two) times daily. 60 tablet 5   ciclopirox (PENLAC) 8 % solution Apply topically at bedtime. Apply over nail and surrounding skin. Apply daily over previous coat. After seven (7) days, may remove with alcohol and continue cycle.  6.6 mL 3   metoprolol tartrate (LOPRESSOR) 25 MG tablet Take 1 tablet (25 mg total) by mouth 2 (two) times daily. 180 tablet 3   OZEMPIC, 0.25 OR 0.5 MG/DOSE, 2 MG/1.5ML SOPN Inject 1 mg into the skin once a week. (Patient taking differently: Inject 1 mg into the skin every Thursday.) 1.5 mL 0   pravastatin  (PRAVACHOL) 20 MG tablet Take 1 tablet (20 mg total) by mouth daily. 90 tablet 3   sildenafil (REVATIO) 20 MG tablet Take 1-5 pills about 30 min prior to sex. Start with 1 and increase as needed. 30 tablet 2   valsartan (DIOVAN) 320 MG tablet Take 320 mg by mouth daily.     No current facility-administered medications for this visit.    Family History Family History  Problem Relation Age of Onset   Heart attack Mother    Heart disease Mother    Parkinson's disease Father    Heart attack Maternal Grandfather    Heart disease Maternal Grandfather    Heart attack Maternal Aunt    Heart attack Maternal Grandmother    Prostate cancer Neg Hx    Bladder Cancer Neg Hx    Kidney cancer Neg Hx       Social History Social History   Tobacco Use   Smoking status: Former    Types: Cigars   Smokeless tobacco: Never   Tobacco comments:    once a while had one cigar >10 years ago  Vaping Use   Vaping Use: Never used  Substance Use Topics   Alcohol use: No    Alcohol/week: 0.0 standard drinks of alcohol   Drug use: No        Review of Systems  All other systems reviewed and are negative.     Physical Exam Blood pressure 116/76, pulse 64, temperature 98.1 F (36.7 C), temperature source Oral, height '6\' 4"'$  (1.93 m), weight 275 lb 6.4 oz (124.9 kg), SpO2 98 %. Last Weight  Most recent update: 01/28/2022 11:22 AM    Weight  124.9 kg (275 lb 6.4 oz)             CONSTITUTIONAL: Well developed, and nourished, appropriately responsive and aware without distress.  BMI 33 EYES: Sclera non-icteric.   EARS, NOSE, MOUTH AND THROAT:  The oropharynx is clear. Oral mucosa is pink and moist.     Hearing is intact to voice.  NECK: Trachea is midline, and there is no jugular venous distension.  LYMPH NODES:  Lymph nodes in the neck are not enlarged. RESPIRATORY:  Lungs are clear, and breath sounds are equal bilaterally. Normal respiratory effort without pathologic use of accessory  muscles. CARDIOVASCULAR: Heart is regular in rate and rhythm. GI: The abdomen is well rounded, soft, nontender, and nondistended. MUSCULOSKELETAL:  Symmetrical muscle tone appreciated in all four extremities.    SKIN: Skin turgor is normal. No pathologic skin lesions appreciated.  There is a 5 to 6 cm diameter area of hyperemia and induration, with multiple foci of small 1 to 2 cm pustules.  None draining.  No fluctuance.  Minimally tender on exam.  NEUROLOGIC:  Motor and sensation appear grossly normal.  Cranial nerves are grossly without defect. PSYCH:  Alert and oriented to person, place and time. Affect is appropriate for situation.  Data Reviewed I have personally reviewed what is currently available of the patient's imaging, recent labs and medical records.   Labs:     Latest Ref Rng & Units 03/10/2021  12:59 PM 12/08/2020   12:41 PM 10/30/2020   11:38 AM  CBC  WBC 3.4 - 10.8 x10E3/uL 8.5  8.8  10.8   Hemoglobin 13.0 - 17.7 g/dL 15.3  13.7  11.8   Hematocrit 37.5 - 51.0 % 44.6  41.8  36.1   Platelets 150 - 450 x10E3/uL 300  306  342       Latest Ref Rng & Units 03/10/2021   12:59 PM 12/08/2020   12:41 PM 10/30/2020   11:38 AM  CMP  Glucose 70 - 99 mg/dL 109  107  126   BUN 8 - 27 mg/dL 22  24  34   Creatinine 0.76 - 1.27 mg/dL 1.17  1.08  1.32   Sodium 134 - 144 mmol/L 139  138  138   Potassium 3.5 - 5.2 mmol/L 4.7  4.8  4.6   Chloride 96 - 106 mmol/L 103  100  106   CO2 20 - 29 mmol/L '21  22  23   '$ Calcium 8.6 - 10.2 mg/dL 10.2  9.8  9.4   Total Protein 6.5 - 8.1 g/dL   8.3   Total Bilirubin 0.3 - 1.2 mg/dL   0.9   Alkaline Phos 38 - 126 U/L   103   AST 15 - 41 U/L   25   ALT 0 - 44 U/L   28       Imaging:  Within last 24 hrs: No results found.  Assessment    Cellulitis with probable micro loculated abscesses. Patient Active Problem List   Diagnosis Date Noted   Pressure injury of skin 10/13/2020   Protein-calorie malnutrition, severe 10/10/2020    Postprocedural intraabdominal abscess    Acute respiratory failure ([place]) 09/28/2020   Encounter for nasogastric (NG) tube placement    Altered mental status    Abscess    Acute kidney failure, unspecified ([place])    Sepsis with acute renal failure and septic shock ([place]) 09/25/2020   S/P laparoscopic cholecystectomy 85/63/1497   Metabolic syndrome 02/63/7858   Atrial fibrillation ([place]) 07/09/2020   Class 1 obesity due to excess calories with serious comorbidity and body mass index (BMI) of 33.0 to 33.9 in adult 07/06/2019   Malignant neoplasm of urinary bladder ([place]) 12/06/2018   Overweight (BMI 25.0-29.9) 07/04/2018   Hyperlipidemia associated with type 2 diabetes mellitus ([place]) 11/30/2016   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  We will continue his Augmentin in the interim and have him return on Tuesday after a greater than 1 day off of his Eliquis. We reviewed open wound care.  Face-to-face time spent with the patient and accompanying care providers(if present) was 25 minutes, with more than 50% of the time spent counseling, educating, and coordinating care of the patient.    These notes generated with voice recognition software. I apologize for typographical errors.  Ronny Bacon M.D., FACS 01/28/2022, 12:09 PM

## 2022-02-02 ENCOUNTER — Other Ambulatory Visit: Payer: Self-pay

## 2022-02-02 ENCOUNTER — Ambulatory Visit: Payer: Medicare Other | Admitting: Surgery

## 2022-02-02 ENCOUNTER — Other Ambulatory Visit: Payer: Self-pay | Admitting: Surgery

## 2022-02-02 ENCOUNTER — Encounter: Payer: Self-pay | Admitting: Surgery

## 2022-02-02 VITALS — BP 138/87 | HR 80 | Temp 98.2°F | Ht 76.0 in | Wt 277.0 lb

## 2022-02-02 DIAGNOSIS — L03312 Cellulitis of back [any part except buttock]: Secondary | ICD-10-CM | POA: Diagnosis not present

## 2022-02-02 DIAGNOSIS — L02212 Cutaneous abscess of back [any part, except buttock]: Secondary | ICD-10-CM

## 2022-02-02 DIAGNOSIS — L720 Epidermal cyst: Secondary | ICD-10-CM

## 2022-02-02 DIAGNOSIS — L02219 Cutaneous abscess of trunk, unspecified: Secondary | ICD-10-CM | POA: Insufficient documentation

## 2022-02-02 NOTE — Progress Notes (Signed)
Incision and excisional debridement skin and subcutaneous tissues for complicated abscess of mid back.  Pre-operative Diagnosis: Multi loculated abscess with associated dermal cyst.  Post-operative Diagnosis: same.    Surgeon: Ronny Bacon, M.D., FACS  Anesthesia: Local   Findings: As expected no 1 single large cavity aside from that containing sebum, and cystic lining.  Multiloculated abscesses involving the dermis which required excision of the dermis itself.  Estimated Blood Loss: 15 mL         Specimens: Skin and subcutaneous tissues.          Complications: none              Procedure Details  The patient was evaluated, the benefits, complications, treatment options, and expected outcomes were discussed with the patient. The risks of bleeding, infection, recurrence of symptoms, failure to resolve symptoms, unanticipated injury, any of which could require further surgery were reviewed with the patient. The likelihood of improving the patient's symptoms with return to their baseline status is expected.  The patient and/or family concurred with the proposed plan, giving informed consent.  The patient was taken to our procedure room, identified and the procedure verified.    The patient was positioned in the prone position and the back was prepped with  Chloraprep and draped in the sterile fashion.  A Time Out was held and the above information confirmed.  After local infiltration of 1% lidocaine with epinephrine is completed to adequate anesthetic effect.  A wide incision, circumcising the area of pustular dermal involvement.  This involved an area of about 5 to 6 cm in width and about 3 cm in height, extending to a depth of the deep subcutaneous tissues.  This unroofed multiloculated cavities of abscess, and exposed the underlying dermal cyst.  Utilizing a bone curette I then proceeded with further excisional debridement of the deep subcutaneous tissues ensuring maximal excision of the  cyst and cystic contents along with draining any potential residual cavities and discovering undermining and tunneling which seem to extend to the patient's right. Once this was completed the wound was then irrigated with saline solution.  Mopped out with gauze sponges.  Hemostasis confirmed with pressure.  Half-inch iodoform packing strip was utilized to fill the cavity and wound.  This was then moistened with normal saline.  Cover dressing of 4 x 4 gauze was applied and secured with tape.  He will continue his Augmentin.  I will continue to hold his Eliquis through Thursday.  Patient reports he has no caregiver at home to assist him with dressing changes, and will leave the current dressing intact.  I suggested he remove it the morning prior to his presentation and shower.  However this may be challenging since he apparently has no help at home.  We will see him back in 2 days to initiate assisting him with wound care.       Ronny Bacon M.D., [facility] 02/02/2022 12:35 PM

## 2022-02-02 NOTE — Patient Instructions (Addendum)
Do not start your Eliquis until we say when to start. Remove the dressing Thursday morning, shower then come to the office for your appointment.      We moved a Cyst in our office today.  You have sutures under the skin that will dissolve and also dermabond (skin glue) on top of your skin which will come off on it's own in 10-14 days.  You may use Ibuprofen or Tylenol as needed for pain control. Use the ice pack 3-4 times a day for the next two days for any achiness.  You may shower tomorrow, do not scrub at the area.  Avoid Strenuous activities that will make you sweat during the next 48 hours to avoid the glue coming off prematurely. Avoid activities that will place pressure to this area of the body for 1-2 weeks to avoid re-injury to incision site.  Please see your follow-up appointment provided. We will see you back in office to make sure this area is healed and to review the final pathology. If you have any questions or concerns prior to this appointment, call our office and speak with a nurse.    Excision of Skin Cysts or Lesions Excision of a skin lesion refers to the removal of a section of skin by making small cuts (incisions) in the skin. This procedure may be done to remove a cancerous (malignant) or noncancerous (benign) growth on the skin. It is typically done to treat or prevent cancer or infection. It may also be done to improve cosmetic appearance. The procedure may be done to remove: Cancerous growths, such as basal cell carcinoma, squamous cell carcinoma, or melanoma. Noncancerous growths, such as a cyst or lipoma. Growths, such as moles or skin tags, which may be removed for cosmetic reasons.  Various excision or surgical techniques may be used depending on your condition, the location of the lesion, and your overall health. Tell a health care provider about: Any allergies you have. All medicines you are taking, including vitamins, herbs, eye drops, creams, and  over-the-counter medicines. Any problems you or family members have had with anesthetic medicines. Any blood disorders you have. Any surgeries you have had. Any medical conditions you have. Whether you are pregnant or may be pregnant. What are the risks? Generally, this is a safe procedure. However, problems may occur, including: Bleeding. Infection. Scarring. Recurrence of the cyst, lipoma, or cancer. Changes in skin sensation or appearance, such as discoloration or swelling. Reaction to the anesthetics. Allergic reaction to surgical materials or ointments. Damage to nerves, blood vessels, muscles, or other structures. Continued pain.  What happens before the procedure? Ask your health care provider about: Changing or stopping your regular medicines. This is especially important if you are taking diabetes medicines or blood thinners. Taking medicines such as aspirin and ibuprofen. These medicines can thin your blood. Do not take these medicines before your procedure if your health care provider instructs you not to. You may be asked to take certain medicines. You may be asked to stop smoking. You may have an exam or testing. What happens during the procedure? To reduce your risk of infection: Your health care team will wash or sanitize their hands. Your skin will be washed with soap. You will be given a medicine to numb the area (local anesthetic). One of the following excision techniques will be performed. At the end of any of these procedures, antibiotic ointment will be applied as needed. Each of the following techniques may vary among health care  providers and hospitals. Complete Surgical Excision The area of skin that needs to be removed will be marked with a pen. Using a small scalpel or scissors, the surgeon will gently cut around and under the lesion until it is completely removed. The lesion will be placed in a fluid and sent to the lab for examination. If necessary,  bleeding will be controlled with a device that delivers heat (electrocautery). The edges of the wound may be stitched (sutured) together, and a bandage (dressing) will be applied. This procedure may be performed to treat a cancerous growth or a noncancerous cyst or lesion. Excision of a Cyst The surgeon will make an incision on the cyst. The entire cyst will be removed through the incision. The incision may be closed with sutures.  What happens after the procedure? Return to your normal activities as told by your health care provider.

## 2022-02-04 ENCOUNTER — Ambulatory Visit (INDEPENDENT_AMBULATORY_CARE_PROVIDER_SITE_OTHER): Payer: Medicare Other | Admitting: Surgery

## 2022-02-04 ENCOUNTER — Encounter: Payer: Self-pay | Admitting: Surgery

## 2022-02-04 DIAGNOSIS — L720 Epidermal cyst: Secondary | ICD-10-CM

## 2022-02-04 MED ORDER — DOXYCYCLINE HYCLATE 100 MG PO TABS: ORAL_TABLET | ORAL | 0 refills | Status: DC

## 2022-02-04 NOTE — Progress Notes (Signed)
[facility] POST-OP OFFICE VISIT  02/04/2022  HPI: Richard Oconnell is a 66 y.o. male 2 days s/p I&D/excisional debridement of mid back abscess/cellulitis.  He has not done a dressing change at home, unable to has no resources and apparently no one to help him.  Vital signs: BP (!) 146/88   Pulse 84   Temp 98.2 F (36.8 C) (Oral)   Ht '6\' 4"'$  (1.93 m)   Wt 276 lb (125.2 kg)   SpO2 96%   BMI 33.60 kg/m    Physical Exam: Constitutional: He appears well  Skin: More induration around the wound then I would anticipate persisting, however its only been 48 hours.  The wound is clean without malodor or discharge.  There is no necrosis or drainage.  Apparently the packing did not stick which is surprising.  And there is no need for any further excisional debridement at this time.  Assessment/Plan: This is a 66 y.o. male 2 days after excision of the back multiloculated abscesses.  Patient Active Problem List   Diagnosis Date Noted   Cellulitis and abscess of trunk 02/02/2022   Epidermoid cyst of skin of back 02/02/2022   Cellulitis of back except buttock 01/28/2022   Pressure injury of skin 10/13/2020   Protein-calorie malnutrition, severe 10/10/2020   Postprocedural intraabdominal abscess    Acute respiratory failure ([place]) 09/28/2020   Encounter for nasogastric (NG) tube placement    Altered mental status    Abscess    Acute kidney failure, unspecified ([place])    Sepsis with acute renal failure and septic shock ([place]) 09/25/2020   S/P laparoscopic cholecystectomy 61/95/0932   Metabolic syndrome 67/04/4579   Atrial fibrillation ([place]) 07/09/2020   Class 1 obesity due to excess calories with serious comorbidity and body mass index (BMI) of 33.0 to 33.9 in adult 07/06/2019   Malignant neoplasm of urinary bladder ([place]) 12/06/2018   Overweight (BMI 25.0-29.9) 07/04/2018   Hyperlipidemia associated with type 2 diabetes mellitus ([place]) 11/30/2016   Bladder diverticulum  11/30/2016   Gross hematuria 11/16/2016   Erectile dysfunction associated with type 2 diabetes mellitus ([place]) 05/26/2016   Diabetes mellitus with coincident hypertension ([place]) 06/11/2015   Type 2 diabetes mellitus with diabetic polyneuropathy, without long-term current use of insulin ([place]) 06/11/2015    -I found some Aquacel Ag rope to utilize for wound care.  I believe this will last 5 days without being changed.  We covered it with Tegaderm so perhaps he can shower without getting his dressing wet.  He does not have to shower daily he understands that and hopefully this will provide good wound care over the next 5 days without intervention. I am concerned regarding the persistence of induration around the wound, he is taken his last Augmentin today.  He did not tolerate doxycycline before because of taking it without food.  I believe it would be wise to continue to cover him with doxycycline, and now he is aware what to do to keep it from causing gastric upset. We will see him on Tuesday next week for wound care/dressing change.  I have advised that he pursue obtaining more of the Aquacel Ag extra for future care.  He will take the residual for the next dressing change home with him along with the packaging, and have advised he shop to obtain some.   Ronny Bacon M.D., FACS 02/04/2022, 9:09 AM

## 2022-02-04 NOTE — Patient Instructions (Addendum)
If you have any concerns or questions, please feel free to call our office. See follow up appointment below.   Incision and Drainage, Care After This sheet gives you information about how to care for yourself after your procedure. Your health care provider may also give you more specific instructions. If you have problems or questions, contact your health care provider. What can I expect after the procedure? After the procedure, it is common to have: Pain or discomfort around the incision site. Blood, fluid, or pus (drainage) from the incision. Redness and firm skin around the incision site. Follow these instructions at home: Medicines Take over-the-counter and prescription medicines only as told by your health care provider. If you were prescribed an antibiotic medicine, use or take it as told by your health care provider. Do not stop using the antibiotic even if you start to feel better. Wound care Follow instructions from your health care provider about how to take care of your wound. Make sure you: Wash your hands with soap and water before and after you change your bandage (dressing). If soap and water are not available, use hand sanitizer. Change your dressing and packing as told by your health care provider. If your dressing is dry or stuck when you try to remove it, moisten or wet the dressing with saline or water so that it can be removed without harming your skin or tissues. If your wound is packed, leave it in place until your health care provider tells you to remove it. To remove the packing, moisten or wet the packing with saline or water so that it can be removed without harming your skin or tissues. Leave stitches (sutures), skin glue, or adhesive strips in place. These skin closures may need to stay in place for 2 weeks or longer. If adhesive strip edges start to loosen and curl up, you may trim the loose edges. Do not remove adhesive strips completely unless your health care  provider tells you to do that. Check your wound every day for signs of infection. Check for: More redness, swelling, or pain. More fluid or blood. Warmth. Pus or a bad smell. If you were sent home with a drain tube in place, follow instructions from your health care provider about: How to empty it. How to care for it at home.  General instructions Rest the affected area. Do not take baths, swim, or use a hot tub until your health care provider approves. Ask your health care provider if you may take showers. You may only be allowed to take sponge baths. Return to your normal activities as told by your health care provider. Ask your health care provider what activities are safe for you. Your health care provider may put you on activity or lifting restrictions. The incision will continue to drain. It is normal to have some clear or slightly bloody drainage. The amount of drainage should lessen each day. Do not apply any creams, ointments, or liquids unless you have been told to by your health care provider. Keep all follow-up visits as told by your health care provider. This is important. Contact a health care provider if: Your cyst or abscess returns. You have more redness, swelling, or pain around your incision. You have more fluid or blood coming from your incision. Your incision feels warm to the touch. You have pus or a bad smell coming from your incision. You have red streaks above or below the incision site. Get help right away if: You have severe  pain or bleeding. You cannot eat or drink without vomiting. You have a fever or chills. You have redness that spreads quickly. You have decreased urine output. You become short of breath. You have chest pain. You cough up blood. The affected area becomes numb or starts to tingle. These symptoms may represent a serious problem that is an emergency. Do not wait to see if the symptoms will go away. Get medical help right away. Call your  local emergency services (911 in the [place] Do not drive yourself to the hospital. Summary After this procedure, it is common to have fluid, blood, or pus coming from the surgery site. Follow all home care instructions. You will be told how to take care of your incision, how to check for infection, and how to take medicines. If you were prescribed an antibiotic medicine, take it as told by your health care provider. Do not stop taking the antibiotic even if you start to feel better. Contact a health care provider if you have increased redness, swelling, or pain around your incision. Get help right away if you have chest pain, you vomit, you cough up blood, or you have shortness of breath. Keep all follow-up visits as told by your health care provider. This is important. This information is not intended to replace advice given to you by your health care provider. Make sure you discuss any questions you have with your health care provider. Document Revised: 08/06/2021 Document Reviewed: 02/12/2021 Elsevier Patient Education  [place].

## 2022-02-07 ENCOUNTER — Encounter: Payer: Self-pay | Admitting: Family Medicine

## 2022-02-09 ENCOUNTER — Other Ambulatory Visit: Payer: Self-pay

## 2022-02-09 ENCOUNTER — Telehealth: Payer: Self-pay

## 2022-02-09 ENCOUNTER — Ambulatory Visit (INDEPENDENT_AMBULATORY_CARE_PROVIDER_SITE_OTHER): Payer: Medicare Other | Admitting: Surgery

## 2022-02-09 ENCOUNTER — Encounter: Payer: Self-pay | Admitting: Surgery

## 2022-02-09 VITALS — BP 116/76 | HR 79 | Temp 98.4°F | Ht 76.0 in | Wt 275.0 lb

## 2022-02-09 DIAGNOSIS — L02212 Cutaneous abscess of back [any part, except buttock]: Secondary | ICD-10-CM

## 2022-02-09 DIAGNOSIS — L03312 Cellulitis of back [any part except buttock]: Secondary | ICD-10-CM

## 2022-02-09 DIAGNOSIS — L02219 Cutaneous abscess of trunk, unspecified: Secondary | ICD-10-CM

## 2022-02-09 NOTE — Telephone Encounter (Signed)
Spoke with patient to let him know he may resume his Eliquis.

## 2022-02-09 NOTE — Progress Notes (Signed)
[facility] POST-OP OFFICE VISIT  02/09/2022  HPI: Richard Oconnell is a 66 y.o. male  s/p I&D/excisional debridement of mid back abscess/cellulitis.  He reports his Aquacel Ag dressing fell off 2 days ago, unable to unable to replace, has continued to shower with it and left it uncovered, still with no one to help him.  Vital signs: BP 116/76   Pulse 79   Temp 98.4 F (36.9 C) (Oral)   Ht '6\' 4"'$  (1.93 m)   Wt 275 lb (124.7 kg)   SpO2 98%   BMI 33.47 kg/m    Physical Exam: Constitutional: He appears well  Skin: Improving induration around the wound.  The wound is clean without malodor or discharge.  There is no necrosis or drainage.  There is clearly a fibrinous exudate coating, so after application of 2% lidocaine jelly, I proceeded with excisional debridement with a bone curette.  Final wound measurements are 3.7 and with 1.4 from cephalad to caudad, 1.3 cm depth.  And to the right there is a centimeter of undermining.  The volume of the cavity is significantly diminished, as it required half the amount of Aquacel Ag to fill it.  His wound is 100% granulated.  And no evidence of any micro loculated abscesses at the perimeter upon pressure and palpation.  Marked diminishment in tenderness.  Assessment/Plan: This is a 66 y.o. male 2 days after excision of the back multiloculated abscesses.  Patient Active Problem List   Diagnosis Date Noted   Cellulitis and abscess of trunk 02/02/2022   Epidermoid cyst of skin of back 02/02/2022   Cellulitis of back except buttock 01/28/2022   Benign essential hypertension 12/11/2020   Pressure injury of skin 10/13/2020   Protein-calorie malnutrition, severe 10/10/2020   Postprocedural intraabdominal abscess    Acute respiratory failure ([place]) 09/28/2020   Encounter for nasogastric (NG) tube placement    Altered mental status    Abscess    Acute kidney failure, unspecified ([place])    Sepsis with acute renal failure and septic  shock ([place]) 09/25/2020   S/P laparoscopic cholecystectomy 54/01/8118   Metabolic syndrome 14/78/2956   Atrial fibrillation ([place]) 07/09/2020   Class 1 obesity due to excess calories with serious comorbidity and body mass index (BMI) of 33.0 to 33.9 in adult 07/06/2019   Malignant neoplasm of urinary bladder ([place]) 12/06/2018   Overweight (BMI 25.0-29.9) 07/04/2018   Hyperlipidemia associated with type 2 diabetes mellitus ([place]) 11/30/2016   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  See wound care measurements above.  -Continue Aquacel Ag rope to utilize for wound care.   We covered it with Tegaderm so perhaps he can shower without getting his dressing wet.  He does not have to shower daily he understands that and hopefully this will provide good wound care over the next 2 days without intervention. Appears to be tolerating the doxycycline well, and I do not see need to go back to Augmentin.   We will see him on Thursday this week for wound care/dressing change.  I have advised that he pursue obtaining more of the Aquacel Ag extra for future care.  He will take the  residual for the next dressing change home with him along with the packaging, and have advised he shop to obtain some.   Ronny Bacon M.D., FACS 02/09/2022, 11:23 AM

## 2022-02-11 ENCOUNTER — Ambulatory Visit (INDEPENDENT_AMBULATORY_CARE_PROVIDER_SITE_OTHER): Payer: Medicare Other | Admitting: Surgery

## 2022-02-11 ENCOUNTER — Encounter: Payer: Self-pay | Admitting: Surgery

## 2022-02-11 VITALS — BP 132/80 | HR 73 | Temp 98.3°F | Ht 76.0 in | Wt 276.0 lb

## 2022-02-11 DIAGNOSIS — L02212 Cutaneous abscess of back [any part, except buttock]: Secondary | ICD-10-CM

## 2022-02-11 DIAGNOSIS — L02219 Cutaneous abscess of trunk, unspecified: Secondary | ICD-10-CM

## 2022-02-11 DIAGNOSIS — L03312 Cellulitis of back [any part except buttock]: Secondary | ICD-10-CM

## 2022-02-11 NOTE — Progress Notes (Signed)
Mr. Detty presents today for his dressing change.  He reports he showered this morning and the Tegaderm was not sufficient to keep the water out. Nevertheless, his wound appears to be stable, without evidence of any deterioration, clean and without malodor or significant discharge. He continues his doxycycline. We will replace the packing with Aquacel Ag rope.  Cover it and sealed at best we can hopefully get 5 days out of it and assist him with dressing change next week on Tuesday.

## 2022-02-11 NOTE — Patient Instructions (Addendum)
We will have you come in two times a week for dressing changes with the Aquacell.   We will have you follow up here on October 2nd at 10 am.  Call if you dressing becomes saturated before that.

## 2022-02-15 ENCOUNTER — Ambulatory Visit (INDEPENDENT_AMBULATORY_CARE_PROVIDER_SITE_OTHER): Payer: Medicare Other

## 2022-02-15 DIAGNOSIS — L02212 Cutaneous abscess of back [any part, except buttock]: Secondary | ICD-10-CM

## 2022-02-15 DIAGNOSIS — L03312 Cellulitis of back [any part except buttock]: Secondary | ICD-10-CM

## 2022-02-15 NOTE — Progress Notes (Signed)
Removed tegaderm. Remove all aquacel. Replaced new Aquacell into wound bed. Applied gauze and covered with tegaderm. Wound is decreasing in zize. No drainage. Denies fever.

## 2022-02-18 ENCOUNTER — Ambulatory Visit (INDEPENDENT_AMBULATORY_CARE_PROVIDER_SITE_OTHER): Payer: Medicare Other | Admitting: Surgery

## 2022-02-18 ENCOUNTER — Encounter: Payer: Self-pay | Admitting: Surgery

## 2022-02-18 VITALS — BP 149/92 | HR 91 | Temp 98.0°F | Ht 76.0 in | Wt 270.0 lb

## 2022-02-18 DIAGNOSIS — L02219 Cutaneous abscess of trunk, unspecified: Secondary | ICD-10-CM

## 2022-02-18 DIAGNOSIS — L02212 Cutaneous abscess of back [any part, except buttock]: Secondary | ICD-10-CM | POA: Diagnosis not present

## 2022-02-18 DIAGNOSIS — L03312 Cellulitis of back [any part except buttock]: Secondary | ICD-10-CM

## 2022-02-18 NOTE — Progress Notes (Signed)
Surgical Clinic Progress/Follow-up Note   HPI:  [name] presents to clinic for back wound care follow-up he has been coming twice weekly for dressing changes.  Today we appreciated fair amount of fibrinous exudate coating the granulation of his wound and felt it prudent to proceed with excisional debridement.  Otherwise he reports feeling better, not quite as tight as it used to be.  Now he has some itching as well.  A few days follow the last evaluation. Patient reports  improvement/resolution of prior issues and has been tolerating regular diet with +flatus and normal BM's, denies N/V, fever/chills, CP, or SOB.  Has been utilizing Aquacel Ag for his dressing changes.  Review of Systems:  Constitutional: denies fever/chills  ENT: denies sore throat, hearing problems  Respiratory: denies shortness of breath, wheezing  Cardiovascular: denies chest pain, palpitations  Gastrointestinal: denies abdominal pain, N/V, or diarrhea/and bowel function as per interval history Skin: Denies any other rashes or skin discolorations except post-surgical wounds as per interval history  Vital Signs:  There were no vitals taken for this visit.   Physical Exam:  Constitutional:  -- Obese body habitus  -- Awake, alert, and oriented x3  Pulmonary:  -- Breathing non-labored at rest Cardiovascular:  -- RRR Gastrointestinal:  --Well rounded, soft and non-distended, non-tender/with no tenderness to palpation, no guarding/rebound tenderness Musculoskeletal / Integumentary:  -- Wounds or skin discoloration: Mid back wound, 3.3 cm from left to right, 1 cm from cranial to caudad.  7 mm of depth with undermining to the right at 1 cm.  Remarkable volume diminishment, 100% granulated surrounding skin appears healthy.   -- Extremities: B/L UE and LE FROM, hands and feet warm, no edema     Imaging: No new pertinent imaging available for review   Assessment:  66 y.o. yo Male with a problem list including...   Patient Active Problem List   Diagnosis Date Noted   Cellulitis and abscess of trunk 02/02/2022   Epidermoid cyst of skin of back 02/02/2022   Cellulitis of back except buttock 01/28/2022   Benign essential hypertension 12/11/2020   Pressure injury of skin 10/13/2020   Protein-calorie malnutrition, severe 10/10/2020   Postprocedural intraabdominal abscess    Acute respiratory failure ([place]) 09/28/2020   Encounter for nasogastric (NG) tube placement    Altered mental status    Abscess    Acute kidney failure, unspecified ([place])    Sepsis with acute renal failure and septic shock ([place]) 09/25/2020   S/P laparoscopic cholecystectomy 45/80/9983   Metabolic syndrome 38/25/0539   Atrial fibrillation ([place]) 07/09/2020   Class 1 obesity due to excess calories with serious comorbidity and body mass index (BMI) of 33.0 to 33.9 in adult 07/06/2019   Malignant neoplasm of urinary bladder ([place]) 12/06/2018   Overweight (BMI 25.0-29.9) 07/04/2018   Hyperlipidemia associated with type 2 diabetes mellitus ([place]) 11/30/2016   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  Plan:   -Excisional debridement of skin and subcutaneous tissues completed today after application of topical Xylocaine jelly.  A bone curette was utilized to excise the fibrinous exudative debris, obtaining punctate bleeding throughout the granulation wound bed.  Measurements as noted above confirmed post  excisional debridement.             - return to clinic Monday, or as needed, instructed to call office if any questions or concerns  All of the above recommendations were discussed  with the patient, and all of patient's questions were answered to his expressed satisfaction.  These notes generated with voice recognition software. I apologize for typographical errors.  Ronny Bacon, MD, FACS [place]: [facility]. Office: [phone]

## 2022-02-18 NOTE — Patient Instructions (Addendum)
Follow up here on Monday and Thursday for a dressing change.

## 2022-02-22 ENCOUNTER — Ambulatory Visit (INDEPENDENT_AMBULATORY_CARE_PROVIDER_SITE_OTHER): Payer: Medicare Other

## 2022-02-22 VITALS — Ht 76.0 in | Wt 270.0 lb

## 2022-02-22 DIAGNOSIS — L03319 Cellulitis of trunk, unspecified: Secondary | ICD-10-CM

## 2022-02-22 DIAGNOSIS — L02219 Cutaneous abscess of trunk, unspecified: Secondary | ICD-10-CM

## 2022-02-22 NOTE — Progress Notes (Signed)
Patient came in today for a wound check.  The wound is clean, with no signs of infection noted. Packing removed and new Aquacel placed and dry gauze dressing with Tegaderm applied.  Follow up as scheduled.

## 2022-02-25 ENCOUNTER — Other Ambulatory Visit: Payer: Self-pay

## 2022-02-25 ENCOUNTER — Ambulatory Visit (INDEPENDENT_AMBULATORY_CARE_PROVIDER_SITE_OTHER): Payer: Medicare Other | Admitting: Surgery

## 2022-02-25 ENCOUNTER — Encounter: Payer: Self-pay | Admitting: Surgery

## 2022-02-25 DIAGNOSIS — L929 Granulomatous disorder of the skin and subcutaneous tissue, unspecified: Secondary | ICD-10-CM | POA: Diagnosis not present

## 2022-02-25 NOTE — Patient Instructions (Signed)
Please call with any questions or concerns.

## 2022-02-25 NOTE — Progress Notes (Signed)
His wound is continuing to progress, diminishing in size and volume.  There is a perimeter of proud flesh along about a third of the wound.  Otherwise it is 100% granulated.  There is minimal discharge.  And the surrounding skin appears good and healthy.  Without hypergranulation tissue I proceeded with application of a silver nitrate stick to the wound, there was no need for additional excisional debridement.  His wound measurements were less then on last visit.  We then replaced his Aquacel Ag packing and covered his wound with dry gauze and Tegaderm.  He will follow-up next week for wound care.

## 2022-03-01 ENCOUNTER — Ambulatory Visit (INDEPENDENT_AMBULATORY_CARE_PROVIDER_SITE_OTHER): Payer: Medicare Other

## 2022-03-01 DIAGNOSIS — L03319 Cellulitis of trunk, unspecified: Secondary | ICD-10-CM

## 2022-03-01 DIAGNOSIS — L02219 Cutaneous abscess of trunk, unspecified: Secondary | ICD-10-CM

## 2022-03-01 NOTE — Progress Notes (Unsigned)
Remove the dressing. No drainage. Applied new dressing and secured with tegaderm and tape. Patient returning on 03/04/22.

## 2022-03-04 ENCOUNTER — Ambulatory Visit (INDEPENDENT_AMBULATORY_CARE_PROVIDER_SITE_OTHER): Payer: Medicare Other | Admitting: Surgery

## 2022-03-04 ENCOUNTER — Other Ambulatory Visit: Payer: Self-pay

## 2022-03-04 ENCOUNTER — Encounter: Payer: Self-pay | Admitting: Surgery

## 2022-03-04 VITALS — BP 134/84 | HR 67 | Temp 98.0°F | Ht 76.0 in | Wt 267.0 lb

## 2022-03-04 DIAGNOSIS — L929 Granulomatous disorder of the skin and subcutaneous tissue, unspecified: Secondary | ICD-10-CM

## 2022-03-04 NOTE — Progress Notes (Signed)
His wound today is nearly completely healed.  There is a small linear line of granulation tissue.  I explored this with a Q-tip first cleansing the fibrinous exudate, then explored it more deeply with a smaller head of a silver nitrate stick.  There is no evidence of tunneling or tracking.  There was some irritation I believe likely from some fluid drainage that was trapped against his skin.  Examination of the surrounding cysts and skin show no evidence of any communicating cyst, fluctuance or new inflammatory changes.  I applied a dry 2 x 2 covered with a Tegaderm.  Indicated that he would likely not need to dress it any further after removing this current dressing at home.  He can continue to shower cleanse the area and mechanically dry it with a towel.  I expect it can dry and crust over and not create a problem for him.  He would like to follow-up again I will see him back in 2 weeks.

## 2022-03-04 NOTE — Patient Instructions (Signed)
Remove the dressing in 2 days. Shower as usual. Wash the area with soap and water. No need to apply a dressing.

## 2022-03-17 NOTE — Progress Notes (Unsigned)
[facility] POST-OP OFFICE VISIT  03/18/2022  HPI: Ty Buntrock is a 66 y.o. male has had long-term wound care of a mid back wound following an inflamed/infected sebaceous cyst has been completely removed, debrided and now is completely covered with epidermis.  Denies any drainage, denies any pain denies any fevers or chills.  Vital signs: BP 137/85   Pulse 79   Temp 98.1 F (36.7 C) (Oral)   Wt 279 lb 9.6 oz (126.8 kg)   SpO2 98%   BMI 34.03 kg/m    Physical Exam: Constitutional: Appears well, bright and talkative as usual. Abdomen: Well rounded Skin: Full epidermal covering over his back, skin appears to be mature and healthy.  Other cyst noted, without punctum without evidence of inflammation or changes. Photo taken   Assessment/Plan: This is a 66 y.o. male fully healed, from residual wound on back  Patient Active Problem List   Diagnosis Date Noted   Hypergranulation 02/25/2022   Cellulitis and abscess of trunk 02/02/2022   Epidermoid cyst of skin of back 02/02/2022   Cellulitis of back except buttock 01/28/2022   Benign essential hypertension 12/11/2020   Pressure injury of skin 10/13/2020   Protein-calorie malnutrition, severe 10/10/2020   Postprocedural intraabdominal abscess    Acute respiratory failure ([place]) 09/28/2020   Encounter for nasogastric (NG) tube placement    Altered mental status    Abscess    Acute kidney failure, unspecified ([place])    Sepsis with acute renal failure and septic shock ([place]) 09/25/2020   S/P laparoscopic cholecystectomy 09/32/6712   Metabolic syndrome 45/80/9983   Atrial fibrillation ([place]) 07/09/2020   Class 1 obesity due to excess calories with serious comorbidity and body mass index (BMI) of 33.0 to 33.9 in adult 07/06/2019   Malignant neoplasm of urinary bladder ([place]) 12/06/2018   Overweight (BMI 25.0-29.9) 07/04/2018   Hyperlipidemia associated with type 2 diabetes mellitus ([place]) 11/30/2016   Bladder  diverticulum 11/30/2016   Gross hematuria 11/16/2016   Erectile dysfunction associated with type 2 diabetes mellitus ([place]) 05/26/2016   Diabetes mellitus with coincident hypertension ([place]) 06/11/2015   Type 2 diabetes mellitus with diabetic polyneuropathy, without long-term current use of insulin ([place]) 06/11/2015    -Follow-up as needed.   Ronny Bacon M.D., FACS 03/18/2022, 10:20 AM

## 2022-03-18 ENCOUNTER — Ambulatory Visit (INDEPENDENT_AMBULATORY_CARE_PROVIDER_SITE_OTHER): Payer: Medicare Other | Admitting: Surgery

## 2022-03-18 ENCOUNTER — Encounter: Payer: Self-pay | Admitting: Surgery

## 2022-03-18 VITALS — BP 137/85 | HR 79 | Temp 98.1°F | Wt 279.6 lb

## 2022-03-18 DIAGNOSIS — L02212 Cutaneous abscess of back [any part, except buttock]: Secondary | ICD-10-CM

## 2022-03-18 DIAGNOSIS — L720 Epidermal cyst: Secondary | ICD-10-CM

## 2022-03-18 NOTE — Patient Instructions (Signed)
If you have any concerns or questions, please feel free to call our office.   Excision of Skin Lesions, Care After The following information offers guidance on how to care for yourself after your procedure. Your health care provider may also give you more specific instructions. If you have problems or questions, contact your health care provider. What can I expect after the procedure? After your procedure, it is common to have: Soreness or mild pain. Some redness and swelling. Follow these instructions at home: Excision site care  Follow instructions from your health care provider about how to take care of your excision site. Make sure you: Wash your hands with soap and water for at least 20 seconds before and after you change your bandage (dressing). If soap and water are not available, use hand sanitizer. Change your dressing as told by your health care provider. Leave stitches (sutures), skin glue, or adhesive strips in place. These skin closures may need to stay in place for 2 weeks or longer. If adhesive strip edges start to loosen and curl up, you may trim the loose edges. Do not remove adhesive strips completely unless your health care provider tells you to do that. Check the excision area every day for signs of infection. Watch for: More redness, swelling, or pain. Fluid or blood. Warmth. Pus or a bad smell. Keep the site clean, dry, and protected for at least 48 hours. For bleeding, apply gentle but firm pressure to the area using a folded towel for 20 minutes. Do not take baths, swim, or use a hot tub until your health care provider approves. Ask your health care provider if you may take showers. You may only be allowed to take sponge baths. General instructions Take over-the-counter and prescription medicines only as told by your health care provider. Follow instructions from your health care provider about how to minimize scarring. Scarring should lessen over time. Avoid sun  exposure until the area has healed. Use sunscreen to protect the area from the sun after it has healed. Avoid high-impact exercise and activities until the sutures are removed or the area heals. Keep all follow-up visits. This is important. Contact a health care provider if: You have more redness, swelling, or pain around your excision site. You have fluid or blood coming from your excision site. Your excision site feels warm to the touch. You have pus or a bad smell coming from your excision site. You have a fever. You have pain that does not improve in 2-3 days after your procedure. Get help right away if: You have bleeding that does not stop with pressure or a dressing. Your wound opens up. Summary Take over-the-counter and prescription medicines only as told by your health care provider. Change your dressing as told by your health care provider. Contact a health care provider if you have redness, swelling, pain, or other signs of infection around your excision site. Keep all follow-up visits. This is important. This information is not intended to replace advice given to you by your health care provider. Make sure you discuss any questions you have with your health care provider. Document Revised: 12/02/2020 Document Reviewed: 12/02/2020 Elsevier Patient Education  [place].

## 2022-03-22 ENCOUNTER — Ambulatory Visit: Payer: Medicare Other | Admitting: Pharmacist

## 2022-03-22 DIAGNOSIS — E1142 Type 2 diabetes mellitus with diabetic polyneuropathy: Secondary | ICD-10-CM

## 2022-03-22 DIAGNOSIS — I10 Essential (primary) hypertension: Secondary | ICD-10-CM

## 2022-03-22 DIAGNOSIS — I480 Paroxysmal atrial fibrillation: Secondary | ICD-10-CM

## 2022-03-22 NOTE — Chronic Care Management (AMB) (Signed)
03/22/2022 Name: Richard Oconnell MRN: [id number] DOB: 1956/05/10  Chief Complaint  Patient presents with   Richard Oconnell is a 66 y.o. year old male who presented for a telephone visit.   They were referred to the pharmacist by their PCP for assistance in managing diabetes, hypertension, and medication access.    Subjective:  Care Team: Primary Care Provider: [name], DO Cardiologist: [place]  Medication Access/Adherence  Current Pharmacy:  [facility], [address] [place] [address] Phone: [phone] Fax: [place], [place] [place] [address] Phone: [phone] Fax: [place] #[id number] - [place], [place] - [address] [place] [address] Phone: [phone] Fax: [phone]   Patient reports affordability concerns with their medications: No  Patient reports access/transportation concerns to their pharmacy: No  Patient reports adherence concerns with their medications:  No     Diabetes:  Current medications: Ozempic 1 mg weekly  Reports tolerating well  Denies checking home blood sugar recently  Current medication access support: APPROVED for patient assistance for Ozempic from [name] through 05/16/2022    Atrial Fibrillation/Hypertension: Current medications: Eliquis 5 mg twice daily Metoprolol 25 mg twice daily valsartan 362m daily  Patient has an upper arm home BP cuff Current blood pressure/heart rate readings: Recalls last checked yesterday, reading ~134/84  Current medication access support: None; patient does not believe that he has met annual out of pocket expense requirement for Eliquis patient assistance program   Objective: Lab Results  Component Value Date   HGBA1C 7.3 (A) 01/14/2022     Lab Results  Component Value Date   CREATININE 1.17 03/10/2021   BUN 22 03/10/2021   NA 139 03/10/2021   K 4.7 03/10/2021   CL 103 03/10/2021   CO2 21 03/10/2021    Lab Results  Component Value Date   CHOL 105 07/21/2020   HDL 33 (L) 07/21/2020   LDLCALC 45 07/21/2020   TRIG 226 (H) 09/28/2020   CHOLHDL 3.2 07/21/2020   BP Readings from Last 3 Encounters:  03/18/22 137/85  03/04/22 134/84  02/18/22 (!) 149/92   Pulse Readings from Last 3 Encounters:  03/18/22 79  03/04/22 67  02/18/22 91     Medications Reviewed Today     Reviewed by DRennis Petty RPH-CPP (Pharmacist) on 03/22/22 at 1[place]villeList Status: <None>   Medication Order Taking? Sig Documenting Provider Last Dose Status Informant  apixaban (ELIQUIS) 5 MG TABS tablet [id number] Take 1 tablet (5 mg total) by mouth 2 (two) times daily. KOlin Hauser DO Taking Active   ciclopirox (PENLAC) 8 % solution [id number] Apply topically at bedtime. Apply over nail and surrounding skin. Apply daily over previous coat. After seven (7) days, may remove with alcohol and continue cycle. MCriselda Peaches DPM  Active   metoprolol tartrate (LOPRESSOR) 25 MG tablet [id number] Take 1 tablet (25 mg total) by mouth 2 (two) times daily. KOlin Hauser DO Taking Active   OZEMPIC, 0.25 OR 0.5 MG/DOSE, 2 MG/1.5ML SOPN [id number] Inject 1 mg into the skin once a week.  Patient taking differently: Inject 1 mg into the skin every Thursday.   KOlin Hauser DO Taking Active  Med Note Kellie Simmering, Jenene Slicker   Fri Aug 15, 2020  2:42 PM)    pravastatin (PRAVACHOL) 20 MG tablet [id number] Yes Take 1 tablet (20 mg total) by mouth daily. [name], DO Taking Active   sildenafil (REVATIO) 20 MG tablet [id number]  Take 1-5 pills about 30 min prior to sex. Start with 1 and increase as needed. Karamalegos, Devonne Doughty, DO  Active   valsartan (DIOVAN) 320 MG tablet [id number] Yes  Take 320 mg by mouth daily. [provider] Taking Active               Assessment/Plan:   Diabetes: - Reviewed dietary modifications including encourage patient to have regular well-balanced meals throughout the day, while limiting carbohydrate portion sizes  - Meets financial criteria for renewal of Ozempic patient assistance program through [name] for 2024 calendar year. Will collaborate with provider, CPhT, and patient to pursue assistance.     Hypertension/Atrial Fibrillation: - Recommended to monitor home BP and HR, keep log of results and to bring this record to medical appointment    Follow Up Plan: Clinical Pharmacist to outreach to patient by telephone on 05/28/2022 at 11:30 am  Wallace Cullens, PharmD, [place], [facility] [phone]

## 2022-03-22 NOTE — Patient Instructions (Signed)
Goals Addressed             This Visit's Progress    Pharmacy Goals       Our goal bad cholesterol, or LDL, is less than 70 . This is why it is important to continue taking your pravastatin  Please check your home blood pressure, keep a log of the results and bring this with you to your medical appointments.  Feel free to call me with any questions or concerns. I look forward to our next call!  Wallace Cullens, PharmD, [place] [phone]

## 2022-03-26 ENCOUNTER — Telehealth: Payer: Self-pay | Admitting: Pharmacy Technician

## 2022-03-26 DIAGNOSIS — Z596 Low income: Secondary | ICD-10-CM

## 2022-03-26 NOTE — Progress Notes (Signed)
[facility])                                            [place] Team    03/26/2022  Naven Giambalvo 1956-04-05 [id number]                                      Medication Assistance Referral-FOR 2024 RE ENROLLMENT  Referral From: [facility] Embedded RPh Dorthula Perfect   Medication/Company: Larna Daughters / Nov[employer] Patient application portion:  Mailed Provider application portion: Faxed  to Dr. Nobie Putnam Provider address/fax verified via: Office website    Gianny Killman P. Daysen [place], [place]  [phone]

## 2022-03-29 ENCOUNTER — Telehealth: Payer: Self-pay | Admitting: Pharmacist

## 2022-03-29 NOTE — Telephone Encounter (Signed)
   Outreach Note  03/29/2022 Name: Deonta Bomberger MRN: [id number] DOB: 1956-04-11  Referred by: [name], DO Reason for referral : Medication Assistance  Receive a call from patient requesting a call back  Return call to patient to address his questions regarding patient assistance application. Patient will watch for application and return envelope to arrive in mail from [place]. Reports has copy of financial documentation and will return this with completed application via mail to [place].  Follow Up Plan: Clinical Pharmacist to outreach to patient by telephone on 05/28/2022 at 11:30 am   Wallace Cullens, PharmD, [facility] [phone]

## 2022-04-25 ENCOUNTER — Telehealth: Payer: Self-pay | Admitting: Pharmacy Technician

## 2022-04-25 DIAGNOSIS — Z596 Low income: Secondary | ICD-10-CM

## 2022-04-25 NOTE — Progress Notes (Signed)
[facility])                                            [place] Team    04/25/2022  John Williamsen 09/13/1955 [id number]  Received both patient and provider portion(s) of patient assistance application(s) for Ozempic. Faxed completed application and required documents into [name].    Liborio Saccente P. Avanna Sowder, [place]  [phone]

## 2022-05-19 ENCOUNTER — Other Ambulatory Visit: Payer: Self-pay | Admitting: Family Medicine

## 2022-05-19 DIAGNOSIS — I480 Paroxysmal atrial fibrillation: Secondary | ICD-10-CM

## 2022-05-19 NOTE — Telephone Encounter (Signed)
Requested medication (s) are due for refill today: yes  Requested medication (s) are on the active medication list: yes    Last refill: 09/14/21  #60  5 refills  Future visit scheduled   With pharmacy 05/28/22  Notes to clinic:Failed due to labs, please review. Thank you.   Requested Prescriptions  Pending Prescriptions Disp Refills   ELIQUIS 5 MG TABS tablet [Pharmacy Med Name: ELIQUIS 5 MG TABLET] 60 tablet 0    Sig: Take 1 tablet (5 mg total) by mouth 2 (two) times daily.     Hematology:  Anticoagulants - apixaban Failed - 05/19/2022  8:48 AM      Failed - PLT in normal range and within 360 days    Platelets  Date Value Ref Range Status  03/10/2021 300 150 - 450 x10E3/uL Final         Failed - HGB in normal range and within 360 days    Hemoglobin  Date Value Ref Range Status  03/10/2021 15.3 13.0 - 17.7 g/dL Final         Failed - HCT in normal range and within 360 days    Hematocrit  Date Value Ref Range Status  03/10/2021 44.6 37.5 - 51.0 % Final         Failed - Cr in normal range and within 360 days    Creat  Date Value Ref Range Status  08/19/2020 1.10 0.70 - 1.25 mg/dL Final    Comment:    For patients >40 years of age, the reference limit for Creatinine is approximately 13% higher for people identified as African-American. .    Creatinine, Ser  Date Value Ref Range Status  03/10/2021 1.17 0.76 - 1.27 mg/dL Final   Creatinine, Urine  Date Value Ref Range Status  09/26/2020 197 mg/dL Final         Failed - AST in normal range and within 360 days    AST  Date Value Ref Range Status  10/30/2020 25 15 - 41 U/L Final         Failed - ALT in normal range and within 360 days    ALT  Date Value Ref Range Status  10/30/2020 28 0 - 44 U/L Final         Passed - Valid encounter within last 12 months    Recent Outpatient Visits           1 month ago Type 2 diabetes mellitus with diabetic polyneuropathy, without long-term current use of insulin ([place])    [place], Grayland Ormond A, RPH-CPP   4 months ago Type 2 diabetes mellitus with diabetic polyneuropathy, without long-term current use of insulin ([place])   [place], DO   8 months ago Epidermoid cyst of skin of back   [place], DO   11 months ago Type 2 diabetes mellitus with diabetic polyneuropathy, without long-term current use of insulin [facility])   [place], DO   1 year ago Intra-abdominal abscess post-procedure   [place], Devonne Doughty, DO

## 2022-05-28 ENCOUNTER — Ambulatory Visit: Payer: Medicare Other | Admitting: Pharmacist

## 2022-05-28 DIAGNOSIS — E1142 Type 2 diabetes mellitus with diabetic polyneuropathy: Secondary | ICD-10-CM

## 2022-05-28 DIAGNOSIS — I480 Paroxysmal atrial fibrillation: Secondary | ICD-10-CM

## 2022-05-28 DIAGNOSIS — I10 Essential (primary) hypertension: Secondary | ICD-10-CM

## 2022-05-28 NOTE — Patient Instructions (Signed)
Goals Addressed             This Visit's Progress    Pharmacy Goals       Our goal bad cholesterol, or LDL, is less than 70 . This is why it is important to continue taking your pravastatin  Please check your home blood pressure, keep a log of the results and bring this with you to your medical appointments.  Feel free to call me with any questions or concerns. I look forward to our next call!   Wallace Cullens, PharmD, [place] [phone]

## 2022-05-28 NOTE — Progress Notes (Signed)
05/28/2022 Name: Richard Oconnell MRN: [id number] DOB: 1955-11-15  Chief Complaint  Patient presents with   Medication Assistance   Medication Management    Richard Oconnell is a 67 y.o. year old male who presented for a telephone visit.   They were referred to the pharmacist by their PCP for assistance in managing diabetes, hypertension, and medication access.    Subjective:  Care Team: Primary Care Provider: [name], DO Cardiologist: [place]  Medication Access/Adherence  Current Pharmacy:  [place], [place] [address] Phone: [phone] Fax: [place], [address]. [place] [address] Phone: [phone] Fax: [facility] #[phone] - [place], [place] [address] Phone: [phone] Fax: [phone]   Patient reports affordability concerns with their medications: No  Patient reports access/transportation concerns to their pharmacy: No  Patient reports adherence concerns with their medications:  No       Diabetes:   Current medications: Ozempic 1 mg weekly on Thursdays             Reports tolerating well   Unable to review recent readings today as patient not currently home during our call  Denies symptoms of hypoglycemia    Current medication access support: Collaborating with PCP and [facility] CPhT for re-enrollment in [name] patient assistance program for  - Reports has >2 weeks supply remaining - From review of notes, see that [place] contacted [name] on 05/26/2022 and was informed patient's application currently in the verification state and [facility] CPhT plans to follow up with program again on 06/14/2022    Atrial Fibrillation/Hypertension: Current medications: Eliquis 5 mg twice daily Metoprolol 25 mg twice  daily valsartan '320mg'$  daily   Patient has an upper arm home BP cuff Unable to review recent readings today as patient not currently home during our call      Objective:  Lab Results  Component Value Date   HGBA1C 7.3 (A) 01/14/2022    Lab Results  Component Value Date   CREATININE 1.17 03/10/2021   BUN 22 03/10/2021   NA 139 03/10/2021   K 4.7 03/10/2021   CL 103 03/10/2021   CO2 21 03/10/2021    Lab Results  Component Value Date   CHOL 105 07/21/2020   HDL 33 (L) 07/21/2020   LDLCALC 45 07/21/2020   TRIG 226 (H) 09/28/2020   CHOLHDL 3.2 07/21/2020   BP Readings from Last 3 Encounters:  03/18/22 137/85  03/04/22 134/84  02/18/22 (!) 149/92   Pulse Readings from Last 3 Encounters:  03/18/22 79  03/04/22 67  02/18/22 91     Medications Reviewed Today     Reviewed by Rennis Petty, RPH-CPP (Pharmacist) on 05/28/22 at 1349  Med List Status: <None>   Medication Order Taking? Sig Documenting Provider Last Dose Status Informant  apixaban (ELIQUIS) 5 MG TABS tablet [id number] Yes Take 1 tablet (5 mg total) by mouth 2 (two) times daily. [name], DO Taking Active   ciclopirox (PENLAC) 8 % solution [id number]  Apply topically at bedtime. Apply over nail and surrounding skin. Apply daily over previous coat. After seven (7) days, may remove with alcohol and continue cycle. Criselda Peaches, DPM  Active  metoprolol tartrate (LOPRESSOR) 25 MG tablet [id number] Yes Take 1 tablet (25 mg total) by mouth 2 (two) times daily. [name], DO Taking Active   OZEMPIC, 0.25 OR 0.5 MG/DOSE, 2 MG/1.5ML SOPN [id number] Yes Inject 1 mg into the skin once a week.  Patient taking differently: Inject 1 mg into the skin every Thursday.   [name], DO Taking Active            Med Note Kellie Simmering, Jenene Slicker   Fri Aug 15, 2020  2:42 PM)    pravastatin (PRAVACHOL) 20 MG tablet [id number]  Take 1 tablet (20 mg total) by mouth daily. [name], DO  Active   sildenafil (REVATIO) 20 MG tablet [id number]  Take 1-5 pills about 30 min prior to sex. Start with 1 and increase as needed. Karamalegos, Devonne Doughty, DO  Active   valsartan (DIOVAN) 320 MG tablet [id number] Yes Take 320 mg by mouth daily. [provider] Taking Active               Assessment/Plan:   Diabetes: - Have reviewed dietary modifications including encourage patient to have regular well-balanced meals throughout the day, while limiting carbohydrate portion sizes  - Recommended to monitor home blood sugar, keep log of results, bring this record to medical appointment and contact office if readings outside of established parameters - Offer to send 30 day supply Rx for Ozempic into local pharmacy for patient - Patient prefers to instead call me or the office back for this if unable to receive from assistance program. States that she will follow up with [facility] by phone next week     Hypertension/Atrial Fibrillation: - Recommended to monitor home BP and HR, keep log of results, bring this record to medical appointment and contact office if readings outside of established parameters     Follow Up Plan: Clinical Pharmacist to outreach to patient by telephone on 06/18/2022 at 12 pm   Wallace Cullens, PharmD, [place], [facility] [phone]

## 2022-06-08 ENCOUNTER — Telehealth: Payer: Self-pay | Admitting: Pharmacy Technician

## 2022-06-08 DIAGNOSIS — Z596 Low income: Secondary | ICD-10-CM

## 2022-06-08 NOTE — Progress Notes (Signed)
[facility])                                            [place] Team    06/08/2022  Goran Olden Dec 29, 1955 [id number]  Care coordination call placed to [place] in regard to [place] application.   Spoke to [place] who informs patient is APPROVED 05/28/22-05/17/23. Medication will automatically fill and ship to provider's address on file based on last fill date in 2023. Patient may call [place] at [phone] if shipment has not arrived and patient does not have sufficient supply.  Takaya Hyslop P. David Towson, [place]  [phone]

## 2022-06-16 ENCOUNTER — Ambulatory Visit: Payer: Medicare Other | Admitting: Pharmacist

## 2022-06-16 DIAGNOSIS — I10 Essential (primary) hypertension: Secondary | ICD-10-CM

## 2022-06-16 DIAGNOSIS — I480 Paroxysmal atrial fibrillation: Secondary | ICD-10-CM

## 2022-06-16 DIAGNOSIS — E1142 Type 2 diabetes mellitus with diabetic polyneuropathy: Secondary | ICD-10-CM

## 2022-06-16 NOTE — Patient Instructions (Signed)
Goals Addressed             This Visit's Progress    Pharmacy Goals       Our goal bad cholesterol, or LDL, is less than 70 . This is why it is important to continue taking your pravastatin  Please check your home blood pressure, keep a log of the results and bring this with you to your medical appointments.  Feel free to call me with any questions or concerns. I look forward to our next call!  Wallace Cullens, PharmD, [place] [phone]

## 2022-06-16 NOTE — Progress Notes (Signed)
06/16/2022 Name: Richard Oconnell MRN: [id number] DOB: [birth year]  Chief Complaint  Patient presents with   Medication Assistance   Medication Management    Richard Oconnell is a 67 y.o. year old male who presented for a telephone visit.   They were referred to the pharmacist by their PCP for assistance in managing medication access.    Subjective:  Care Team: Primary Care Provider: [name], DO Cardiologist: [place]  Medication Access/Adherence  Current Pharmacy:  [facility] [place], [place] [address] Phone: [phone] Fax: [place], [place]. [place] [address] Phone: [phone] Fax: [facility] #06269 [place], [place] [address] Phone: [phone] Fax: [phone]   Patient reports affordability concerns with their medications: No  Patient reports access/transportation concerns to their pharmacy: No  Patient reports adherence concerns with their medications:  No     Diabetes:   Current medications: Ozempic 1 mg weekly             Reports tolerating well   Recalls last check home morning fasting blood sugar on 1/21, reading: 130   Denies symptoms of hypoglycemia   Exercise: reports activity recently limited by being busy with writing and due to cold temperature outside   Statin: pravastatin 20 mg daily  Current medication access support: Collaborated with PCP and [facility] CPhT for re-enrollment in [name] patient assistance program for 2024 calendar yera - From review of notes from [place], see that patient was APPROVED for re-enrollment 05/28/22-05/17/23 - Today patient reports that he already received a supply of Ozempic from the program, actually received an extra shipment, and now has a 8 month supply     Atrial Fibrillation/Hypertension: Current medications: Eliquis 5 mg twice daily Metoprolol 25 mg twice daily valsartan '320mg'$  daily   Patient has an upper arm home BP cuff Reports last checked home blood pressure: 1/29, reading: 117/82, HR 79  Denies symptoms of hypotension including dizziness/lightheadedness   Objective:  Lab Results  Component Value Date   HGBA1C 7.3 (A) 01/14/2022    Lab Results  Component Value Date   CREATININE 1.17 03/10/2021   BUN 22 03/10/2021   NA 139 03/10/2021   K 4.7 03/10/2021   CL 103 03/10/2021   CO2 21 03/10/2021    Lab Results  Component Value Date   CHOL 105 07/21/2020   HDL 33 (L) 07/21/2020   LDLCALC 45 07/21/2020   TRIG 226 (H) 09/28/2020   CHOLHDL 3.2 07/21/2020   BP Readings from Last 3 Encounters:  03/18/22 137/85  03/04/22 134/84  02/18/22 (!) 149/92   Pulse Readings from Last 3 Encounters:  03/18/22 79  03/04/22 67  02/18/22 91     Medications Reviewed Today     Reviewed by Rennis Petty, RPH-CPP (Pharmacist) on 06/16/22 at 75  Med List Status: <None>   Medication Order Taking? Sig Documenting Provider Last Dose Status Informant  apixaban (ELIQUIS) 5 MG TABS tablet [id number] Yes Take 1 tablet (5 mg total) by mouth 2 (two) times daily. [name], DO Taking Active   ciclopirox (PENLAC) 8 % solution [id number]  Apply topically at bedtime. Apply over nail and surrounding skin. Apply daily over previous coat. After  seven (7) days, may remove with alcohol and continue cycle. Criselda Peaches, DPM  Active   metoprolol tartrate (LOPRESSOR) 25 MG tablet [id number] Yes Take 1 tablet (25 mg total) by mouth 2 (two) times daily. [name], DO Taking Active   OZEMPIC, 0.25 OR 0.5 MG/DOSE, 2 MG/1.5ML SOPN [id number] Yes Inject 1 mg into the skin once a week.  Patient taking differently: Inject 1 mg into the skin every Thursday.   [name], DO Taking Active            Med Note  Kellie Simmering, Jenene Slicker   Fri Aug 15, 2020  2:42 PM)    pravastatin (PRAVACHOL) 20 MG tablet [id number] Yes Take 1 tablet (20 mg total) by mouth daily. [name], DO Taking Active   sildenafil (REVATIO) 20 MG tablet [id number]  Take 1-5 pills about 30 min prior to sex. Start with 1 and increase as needed. Karamalegos, Devonne Doughty, DO  Active   valsartan (DIOVAN) 320 MG tablet [id number] Yes Take 320 mg by mouth daily. [provider] Taking Active               Assessment/Plan:   Patient to contact office to schedule his annual physical and lab appointments  Diabetes: - Reviewed long term cardiovascular and renal outcomes of uncontrolled blood sugar - Reviewed goal A1c, goal fasting, and goal 2 hour post prandial glucose - Reviewed dietary modifications including encourage patient to have regular well-balanced meals throughout the day, while limiting carbohydrate portion sizes  - Recommended to monitor home blood sugar, keep log of results, bring this record to medical appointment and contact office if readings outside of established parameters  Hypertension/Atrial Fibrillation: - Recommended to monitor home BP and HR, keep log of results, bring this record to medical appointment and contact office if readings outside of established parameters     Follow Up Plan: Clinical Pharmacist to outreach to patient by telephone on 03/18/2023 at 11:45 am   Wallace Cullens, PharmD, [place], [place] [phone]

## 2022-06-18 ENCOUNTER — Telehealth: Payer: Medicare Other

## 2022-07-01 ENCOUNTER — Telehealth: Payer: Self-pay | Admitting: *Deleted

## 2022-07-01 ENCOUNTER — Encounter: Payer: Self-pay | Admitting: *Deleted

## 2022-07-01 NOTE — Patient Outreach (Signed)
  Care Coordination   Initial Visit Note   07/01/2022 Name: Richard Oconnell MRN: [id number] DOB: 08/02/1955  Richard Oconnell is a 67 y.o. year old male who sees [name], DO for primary care. I spoke with  [name] by phone today.  What matters to the patients health and wellness today?  Manage DM adequately.  Does not need follow up call at this time, will call with questions.     Goals Addressed             This Visit's Progress    COMPLETED: Care Coordination Activities - No follow up needed       Care Coordination Interventions: Provided education to patient about basic DM disease process Reviewed medications with patient and discussed importance of medication adherence Counseled on importance of regular laboratory monitoring as prescribed Discussed plans with patient for ongoing care management follow up and provided patient with direct contact information for care management team Reviewed scheduled/upcoming provider appointments including: PCP on 2/19 Advised patient, providing education and rationale, to check cbg daily and record, calling provider for findings outside established parameters Screening for signs and symptoms of depression related to chronic disease state  Assessed social determinant of health barriers Discussed recent A1C of 7.3, state he has been monitoring his diet better, anticipate level being back under 7 next week         SDOH assessments and interventions completed:  Yes  SDOH Interventions Today    Flowsheet Row Most Recent Value  SDOH Interventions   Food Insecurity Interventions Intervention Not Indicated  Housing Interventions Intervention Not Indicated  Transportation Interventions Intervention Not Indicated  Utilities Interventions Intervention Not Indicated        Care Coordination Interventions:  Yes, provided   Follow up plan: No further intervention required.   Encounter Outcome:  Pt. Visit  Completed   Valente David, RN, MSN, [place] Care Management Care Management Coordinator [phone]

## 2022-07-01 NOTE — Patient Instructions (Signed)
Visit Information  Thank you for taking time to visit with me today. Please don't hesitate to contact me if I can be of assistance to you.  Following are the goals we discussed today:  Make sure you have yearly foot and eye exams.   Remember appointment with PCP on 2/19.  Please call the Suicide and Crisis Lifeline: 988 call the [place] National Suicide Prevention Lifeline: 410-369-3325 or TTY: 623-033-5920 TTY 301-375-2567) to talk to a trained counselor call 1-800-273-TALK (toll free, 24 hour hotline) call 911 if you are experiencing a Mental Health or [place] or need someone to talk to.  Patient verbalizes understanding of instructions and care plan provided today and agrees to view in [place]. Active MyChart status and patient understanding of how to access instructions and care plan via MyChart confirmed with patient.     The patient has been provided with contact information for the care management team and has been advised to call with any health related questions or concerns.   Valente David, RN, MSN, [place] Care Management Care Management Coordinator [phone]

## 2022-07-05 ENCOUNTER — Ambulatory Visit (INDEPENDENT_AMBULATORY_CARE_PROVIDER_SITE_OTHER): Payer: Medicare Other | Admitting: Family Medicine

## 2022-07-05 ENCOUNTER — Encounter: Payer: Self-pay | Admitting: Family Medicine

## 2022-07-05 VITALS — BP 108/70 | HR 84 | Temp 97.1°F | Wt 274.0 lb

## 2022-07-05 DIAGNOSIS — R351 Nocturia: Secondary | ICD-10-CM

## 2022-07-05 DIAGNOSIS — Z0000 Encounter for general adult medical examination without abnormal findings: Secondary | ICD-10-CM | POA: Diagnosis not present

## 2022-07-05 DIAGNOSIS — I10 Essential (primary) hypertension: Secondary | ICD-10-CM | POA: Diagnosis not present

## 2022-07-05 DIAGNOSIS — E1169 Type 2 diabetes mellitus with other specified complication: Secondary | ICD-10-CM

## 2022-07-05 DIAGNOSIS — E1142 Type 2 diabetes mellitus with diabetic polyneuropathy: Secondary | ICD-10-CM | POA: Diagnosis not present

## 2022-07-05 DIAGNOSIS — I480 Paroxysmal atrial fibrillation: Secondary | ICD-10-CM

## 2022-07-05 DIAGNOSIS — E785 Hyperlipidemia, unspecified: Secondary | ICD-10-CM

## 2022-07-05 DIAGNOSIS — G4700 Insomnia, unspecified: Secondary | ICD-10-CM | POA: Insufficient documentation

## 2022-07-05 MED ORDER — ZOLPIDEM TARTRATE 5 MG PO TABS: 5.0000 mg | ORAL_TABLET | Freq: Every evening | ORAL | 0 refills | Status: DC | PRN

## 2022-07-05 MED ORDER — APIXABAN 5 MG PO TABS: 5.0000 mg | ORAL_TABLET | Freq: Two times a day (BID) | ORAL | 5 refills | Status: DC

## 2022-07-05 NOTE — Assessment & Plan Note (Signed)
Well-controlled HTN No known complications    Plan:  1. Controlled on Metoprolol Valsartan 2. Encourage improved lifestyle - low sodium diet, regular exercise 3. Continue monitor BP outside office, bring readings to next visit, if persistently >140/90 or new symptoms notify office sooner

## 2022-07-05 NOTE — Assessment & Plan Note (Signed)
Chronic AFib Followed by Cardiology On Anticoagulation, Betablocker

## 2022-07-05 NOTE — Assessment & Plan Note (Signed)
Episodic flares with insomnia Improved on Zolpidem 26m AS NEEDED, not every night

## 2022-07-05 NOTE — Progress Notes (Signed)
Subjective:    [name], male    DOB: July 05, 1955, 67 y.o.   MRN: [id number]  Richard Oconnell is a 67 y.o. male presenting on 07/05/2022 for Annual Exam   HPI  Here for Annual Physical and Due for fasting labs.  Paroxysmal Atrial Fibrillation Followed by cardiology On anticoagulation, BB for rate Has done well Goal to come off medication He feels no further episodes of AFib   CHRONIC DM, Type 2 with neuropathy / Nephropathy HTN Previously Followed by Dr Juleen [facility]ph[facility] Currently doing well on Novo Nordisk PAP Meds: Ozempic 53m weekly injection - OFF Metformin - Significant improved diet On ARB Denies any hypoglycemia, polyuria, visual changes, persistent numbness tingling  HYPERLIPIDEMIA: - Reports no concerns. Last lipid panel 2022 and 2018, controlled on  Statin - Currently taking Pravastatin 271m tolerating well without side effects or myalgias  Insomnia Has flares with this on occasion, request re order Zolpidem 66m63mas successful.    Health Maintenance: Previously received Prevnar13 2022, but he says he received Prevnar20 already, we don't have record, and will update this in future instead.     07/05/2022   10:43 AM 01/13/2022   11:46 AM 05/29/2021    2:15 PM  Depression screen PHQ 2/9  Decreased Interest 0 0 1  Down, Depressed, Hopeless 0 0 1  PHQ - 2 Score 0 0 2  Altered sleeping 0 3 0  Tired, decreased energy 0 0 0  Change in appetite 0 0 0  Feeling bad or failure about yourself  0 0 0  Trouble concentrating 0 0 0  Moving slowly or fidgety/restless 0 0 0  Suicidal thoughts 0 0 0  PHQ-9 Score 0 3 2  Difficult doing work/chores Not difficult at all Not difficult at all Somewhat difficult    Past Medical History:  Diagnosis Date   [facility]  Bladder Cancer   Erectile dysfunction associated with type 2 diabetes mellitus ([facility]/02/2017   Gross hematuria 11/16/2016   Hypertension 06/11/2015   Type 2 diabetes mellitus  with diabetic polyneuropathy, without long-term current use of insulin ([facility] [place]/25/2017   Past Surgical History:  Procedure Laterality Date   boil lanceted     hand   COLONOSCOPY WITH PROPOFOL N/A 07/03/2020   Procedure: COLONOSCOPY WITH PROPOFOL;  Surgeon: AnnJonathon BellowsD;  [facility];  Service: Gastroenterology;  Laterality: N/A;   CYSTOSCOPY W/ RETROGRADES Bilateral 12/06/2016   Procedure: CYSTOSCOPY WITH RETROGRADE PYELOGRAM;  Surgeon: BraHollice EspyD;  Location: [facility];  Service: Urology;  Laterality: Bilateral;   CYSTOSCOPY W/ RETROGRADES Bilateral 06/27/2017   Procedure: CYSTOSCOPY WITH RETROGRADE PYELOGRAM;  Surgeon: BraHollice EspyD;  Location: [facility];  Service: Urology;  Laterality: Bilateral;   CYSTOSCOPY WITH BIOPSY N/A 06/27/2017   Procedure: CYSTOSCOPY WITH Bladder BIOPSY;  Surgeon: BraHollice EspyD;  Location: [facility];  Service: Urology;  Laterality: N/A;   CYSTOSCOPY WITH BIOPSY N/A 10/24/2017   Procedure: CYSTOSCOPY WITH Bladder BIOPSY;  Surgeon: BraHollice EspyD;  Location: [facility];  Service: Urology;  Laterality: N/A;   ingrown  Bilateral    ingrown toenail   IR BILIARY DRAIN PLACEMENT WITH CHOLANGIOGRAM  07/10/2020   IR CHOLANGIOGRAM EXISTING TUBE  08/07/2020   IR RADIOLOGIST EVAL & MGMT  08/07/2020   IR RADIOLOGIST EVAL & MGMT  10/29/2020   IR RADIOLOGIST EVAL & MGMT  11/12/2020   TRANSURETHRAL RESECTION OF BLADDER TUMOR N/A 12/06/2016   Procedure: TRANSURETHRAL RESECTION OF  BLADDER TUMOR (TURBT) (2-5cm) CLOT EVACUATION;  Surgeon: Hollice Espy, MD;  Location: [facility];  Service: Urology;  Laterality: N/A;   TRANSURETHRAL RESECTION OF BLADDER TUMOR N/A 06/27/2017   Procedure: TRANSURETHRAL RESECTION OF BLADDER TUMOR (TURBT);  Surgeon: Hollice Espy, MD;  Location: [facility];  Service: Urology;  Laterality: N/A;   TRANSURETHRAL RESECTION OF BLADDER TUMOR WITH MITOMYCIN-C N/A 01/18/2017   Procedure: TRANSURETHRAL RESECTION OF BLADDER TUMOR WITH  MITOMYCIN-C-(SMALL);  Surgeon: Hollice Espy, MD;  Location: [facility];  Service: Urology;  Laterality: N/A;   Social History   Socioeconomic History   Marital status: Divorced    Spouse name: Not on file   Number of children: Not on file   Years of education: Not on file   Highest education level: Not on file  Occupational History   Not on file  Tobacco Use   Smoking status: Former    Types: Cigars    Passive exposure: Past   Smokeless tobacco: Never   Tobacco comments:    once a while had one cigar >10 years ago  Vaping Use   Vaping Use: Never used  Substance and Sexual Activity   Alcohol use: No    Alcohol/week: 0.0 standard drinks of alcohol   Drug use: No   Sexual activity: Never  Other Topics Concern   Not on file  Social History Narrative   Lives alone   Social Determinants of Health   Financial Resource Strain: Not on file  Food Insecurity: No Food Insecurity (07/01/2022)   Hunger Vital Sign    Worried About Running Out of Food in the Last Year: Never true    Ran Out of Food in the Last Year: Never true  Transportation Needs: No Transportation Needs (07/01/2022)   PRAPARE - Hydrologist (Medical): No    Lack of Transportation (Non-Medical): No  Physical Activity: Not on file  Stress: Not on file  Social Connections: Not on file  Intimate Partner Violence: Not on file   Family History  Problem Relation Age of Onset   Heart attack Mother    Heart disease Mother    Parkinson's disease Father    Heart attack Maternal Grandfather    Heart disease Maternal Grandfather    Heart attack Maternal Aunt    Heart attack Maternal Grandmother    Prostate cancer Neg Hx    Bladder Cancer Neg Hx    Kidney cancer Neg Hx    Current Outpatient Medications on File Prior to Visit  Medication Sig   ciclopirox (PENLAC) 8 % solution Apply topically at bedtime. Apply over nail and surrounding skin. Apply daily over previous coat. After seven (7)  days, may remove with alcohol and continue cycle.   metoprolol tartrate (LOPRESSOR) 25 MG tablet Take 1 tablet (25 mg total) by mouth 2 (two) times daily.   OZEMPIC, 0.25 OR 0.5 MG/DOSE, 2 MG/1.5ML SOPN Inject 1 mg into the skin once a week. (Patient taking differently: Inject 1 mg into the skin every Thursday.)   pravastatin (PRAVACHOL) 20 MG tablet Take 1 tablet (20 mg total) by mouth daily.   sildenafil (REVATIO) 20 MG tablet Take 1-5 pills about 30 min prior to sex. Start with 1 and increase as needed.   valsartan (DIOVAN) 320 MG tablet Take 320 mg by mouth daily.   No current facility-administered medications on file prior to visit.    Review of Systems  Constitutional:  Negative for activity change, appetite change, chills, diaphoresis, fatigue  and fever.  HENT:  Negative for congestion and hearing loss.   Eyes:  Negative for visual disturbance.  Respiratory:  Negative for cough, chest tightness, shortness of breath and wheezing.   Cardiovascular:  Negative for chest pain, palpitations and leg swelling.  Gastrointestinal:  Negative for abdominal pain, constipation, diarrhea, nausea and vomiting.  Genitourinary:  Negative for dysuria, frequency and hematuria.  Musculoskeletal:  Negative for arthralgias and neck pain.  Skin:  Negative for rash.  Neurological:  Negative for dizziness, weakness, light-headedness, numbness and headaches.  Hematological:  Negative for adenopathy.  Psychiatric/Behavioral:  Negative for behavioral problems, dysphoric mood and sleep disturbance.    Per HPI unless specifically indicated above      Objective:    BP 108/70 (BP Location: Left Arm, Patient Position: Sitting, Cuff Size: Large)   Pulse 84   Temp (!) 97.1 F (36.2 C) (Temporal)   Wt 274 lb (124.3 kg)   SpO2 99%   BMI 33.35 kg/m   Wt Readings from Last 3 Encounters:  07/05/22 274 lb (124.3 kg)  03/18/22 279 lb 9.6 oz (126.8 kg)  03/04/22 267 lb (121.1 kg)    Physical Exam Vitals and  nursing note reviewed.  Constitutional:      General: He is not in acute distress.    Appearance: He is well-developed. He is not diaphoretic.     Comments: Well-appearing, comfortable, cooperative  HENT:     Head: Normocephalic and atraumatic.  Eyes:     General:        Right eye: No discharge.        Left eye: No discharge.     Conjunctiva/sclera: Conjunctivae normal.     Pupils: Pupils are equal, round, and reactive to light.  Neck:     Thyroid: No thyromegaly.     Vascular: No carotid bruit.  Cardiovascular:     Rate and Rhythm: Normal rate and regular rhythm.     Pulses: Normal pulses.     Heart sounds: Normal heart sounds. No murmur heard. Pulmonary:     Effort: Pulmonary effort is normal. No respiratory distress.     Breath sounds: Normal breath sounds. No wheezing or rales.  Abdominal:     General: Bowel sounds are normal. There is no distension.     Palpations: Abdomen is soft. There is no mass.     Tenderness: There is no abdominal tenderness.  Musculoskeletal:        General: No tenderness. Normal range of motion.     Cervical back: Normal range of motion and neck supple.     Right lower leg: No edema.     Left lower leg: No edema.     Comments: Upper / Lower Extremities: - Normal muscle tone, strength bilateral upper extremities 5/5, lower extremities 5/5  Lymphadenopathy:     Cervical: No cervical adenopathy.  Skin:    General: Skin is warm and dry.     Findings: No erythema or rash.  Neurological:     Mental Status: He is alert and oriented to person, place, and time.     Comments: Distal sensation intact to light touch all extremities  Psychiatric:        Mood and Affect: Mood normal.        Behavior: Behavior normal.        Thought Content: Thought content normal.     Comments: Well groomed, good eye contact, normal speech and thoughts      Results for orders placed  or performed in visit on 01/13/22  POCT glycosylated hemoglobin (Hb A1C)  Result  Value Ref Range   Hemoglobin A1C 7.3 (A) 4.0 - 5.6 %      Assessment & Plan:   Problem List Items Addressed This Visit     Atrial fibrillation ([place])    Chronic AFib Followed by Cardiology On Anticoagulation, Betablocker       Relevant Medications   apixaban (ELIQUIS) 5 MG TABS tablet   Benign essential hypertension    Well-controlled HTN No known complications    Plan:  1. Controlled on Metoprolol Valsartan 2. Encourage improved lifestyle - low sodium diet, regular exercise 3. Continue monitor BP outside office, bring readings to next visit, if persistently >140/90 or new symptoms notify office sooner      Relevant Medications   apixaban (ELIQUIS) 5 MG TABS tablet   Hyperlipidemia associated with type 2 diabetes mellitus ([place])    Controlled cholesterol on statin and lifestyle  Lipids ordered this week  Plan: 1. Continue current meds - Pravastatin 8m daily 2. Encourage improved lifestyle - low carb/cholesterol, reduce portion size, continue improving regular exercise      Relevant Medications   apixaban (ELIQUIS) 5 MG TABS tablet   Insomnia    Episodic flares with insomnia Improved on Zolpidem 582mAS NEEDED, not every night      Relevant Medications   zolpidem (AMBIEN) 5 MG tablet   Type 2 diabetes mellitus with diabetic polyneuropathy, without long-term current use of insulin (HCC)    Labs A1c due this week, will return Previously controlled PAP Ozempic No hypoglycemia or hyperglycemia Complications - peripheral neuropathy, hyperlipidemia, obesity - increases risk of future cardiovascular complications  OFF Metformin, patient preference.  Plan:  1. CONTINUE Ozempic 8m43meekly inj 2. Encourage improved lifestyle - low carb, low sugar diet, reduce portion size, continue improving regular exercise 3. Check CBG, bring log to next visit for review      Relevant Medications   zolpidem (AMBIEN) 5 MG tablet   Other Relevant Orders   Urine Microalbumin  w/creat. ratio   Other Visit Diagnoses     Annual physical exam    -  Primary   Essential hypertension       Relevant Medications   apixaban (ELIQUIS) 5 MG TABS tablet   Nocturia           Updated Health Maintenance information Fasting labs ordered for Thurs 2/22, pending results. Encouraged improvement to lifestyle with diet and exercise Goal of weight loss    Orders Placed This Encounter  Procedures   Urine Microalbumin w/creat. ratio    Standing Status:   Future    Standing Expiration Date:   10/03/2022     Meds ordered this encounter  Medications   apixaban (ELIQUIS) 5 MG TABS tablet    Sig: Take 1 tablet (5 mg total) by mouth 2 (two) times daily.    Dispense:  60 tablet    Refill:  5    Add refills   zolpidem (AMBIEN) 5 MG tablet    Sig: Take 1 tablet (5 mg total) by mouth at bedtime as needed for sleep.    Dispense:  20 tablet    Refill:  0      Follow up plan: Return for Return 3 days Thurs 2/22 at 9am for fasting labs / 6 month f/u DM A1c.  Fasting labs already ordered on file, Future labs and Urine ordered  AleNobie PutnamO [facility] 07/05/2022, 10:53 AM

## 2022-07-05 NOTE — Assessment & Plan Note (Signed)
Labs A1c due this week, will return Previously controlled PAP Ozempic No hypoglycemia or hyperglycemia Complications - peripheral neuropathy, hyperlipidemia, obesity - increases risk of future cardiovascular complications  OFF Metformin, patient preference.  Plan:  1. CONTINUE Ozempic 79m weekly inj 2. Encourage improved lifestyle - low carb, low sugar diet, reduce portion size, continue improving regular exercise 3. Check CBG, bring log to next visit for review

## 2022-07-05 NOTE — Assessment & Plan Note (Signed)
Controlled cholesterol on statin and lifestyle  Lipids ordered this week  Plan: 1. Continue current meds - Pravastatin 68m daily 2. Encourage improved lifestyle - low carb/cholesterol, reduce portion size, continue improving regular exercise

## 2022-07-05 NOTE — Patient Instructions (Addendum)
Thank you for coming to the office today.  Keep on current medication Ozempic 101m weekly.  We will check labs this week Thursday 2/22  Refilled Eliquis for 6 month, 30 day with refills.  DUE for FASTING BLOOD WORK (no food or drink after midnight before the lab appointment, only water or coffee without cream/sugar on the morning of)  SCHEDULE "Lab Only" visit in the morning at the clinic for lab draw in 3 days  - Make sure Lab Only appointment is at about 1 week before your next appointment, so that results will be available  For Lab Results, once available within 2-3 days of blood draw, you can can log in to MyChart online to view your results and a brief explanation. Also, we can discuss results at next follow-up visit.    Please schedule a Follow-up Appointment to: Return for Return 3 days Thurs 2/22 at 9am for fasting labs / 6 month f/u DM A1c.  If you have any other questions or concerns, please feel free to call the office or send a message through [facility] You may also schedule an earlier appointment if necessary.  Additionally, you may be receiving a survey about your experience at our office within a few days to 1 week by e-mail or mail. We value your feedback.  ANobie Putnam DO [facility]

## 2022-07-08 ENCOUNTER — Other Ambulatory Visit: Payer: Self-pay

## 2022-07-08 ENCOUNTER — Other Ambulatory Visit: Payer: Medicare Other

## 2022-07-08 DIAGNOSIS — E1142 Type 2 diabetes mellitus with diabetic polyneuropathy: Secondary | ICD-10-CM | POA: Diagnosis not present

## 2022-07-08 DIAGNOSIS — I10 Essential (primary) hypertension: Secondary | ICD-10-CM | POA: Diagnosis not present

## 2022-07-08 DIAGNOSIS — E785 Hyperlipidemia, unspecified: Secondary | ICD-10-CM | POA: Diagnosis not present

## 2022-07-08 DIAGNOSIS — R351 Nocturia: Secondary | ICD-10-CM

## 2022-07-08 DIAGNOSIS — E1169 Type 2 diabetes mellitus with other specified complication: Secondary | ICD-10-CM | POA: Diagnosis not present

## 2022-07-08 DIAGNOSIS — I480 Paroxysmal atrial fibrillation: Secondary | ICD-10-CM

## 2022-07-08 DIAGNOSIS — Z0000 Encounter for general adult medical examination without abnormal findings: Secondary | ICD-10-CM

## 2022-07-08 DIAGNOSIS — K080 Exfoliation of teeth due to systemic causes: Secondary | ICD-10-CM | POA: Diagnosis not present

## 2022-07-09 LAB — CBC WITH DIFFERENTIAL/PLATELET
Absolute Monocytes: 539 cells/uL (ref 200–950)
Basophils Absolute: 69 cells/uL (ref 0–200)
Basophils Relative: 0.9 %
Eosinophils Absolute: 262 cells/uL (ref 15–500)
Eosinophils Relative: 3.4 %
HCT: 50.8 % — ABNORMAL HIGH (ref 38.5–50.0)
Hemoglobin: 17.4 g/dL — ABNORMAL HIGH (ref 13.2–17.1)
Lymphs Abs: 2441 cells/uL (ref 850–3900)
MCH: 30.6 pg (ref 27.0–33.0)
MCHC: 34.3 g/dL (ref 32.0–36.0)
MCV: 89.4 fL (ref 80.0–100.0)
MPV: 10.7 fL (ref 7.5–12.5)
Monocytes Relative: 7 %
Neutro Abs: 4389 cells/uL (ref 1500–7800)
Neutrophils Relative %: 57 %
Platelets: 312 10*3/uL (ref 140–400)
RBC: 5.68 10*6/uL (ref 4.20–5.80)
RDW: 12.4 % (ref 11.0–15.0)
Total Lymphocyte: 31.7 %
WBC: 7.7 10*3/uL (ref 3.8–10.8)

## 2022-07-09 LAB — MICROALBUMIN / CREATININE URINE RATIO
Creatinine, Urine: 176 mg/dL (ref 20–320)
Microalb Creat Ratio: 11 mcg/mg creat (ref <30)
Microalb, Ur: 1.9 mg/dL

## 2022-07-09 LAB — LIPID PANEL
Cholesterol: 157 mg/dL (ref <200)
HDL: 39 mg/dL — ABNORMAL LOW (ref >=40)
LDL Cholesterol (Calc): 77 mg/dL (calc)
Non-HDL Cholesterol (Calc): 118 mg/dL (calc) (ref <130)
Total CHOL/HDL Ratio: 4 (calc) (ref <5.0)
Triglycerides: 338 mg/dL — ABNORMAL HIGH (ref <150)

## 2022-07-09 LAB — COMPLETE METABOLIC PANEL WITH GFR
AG Ratio: 1.3 (calc) (ref 1.0–2.5)
ALT: 27 U/L (ref 9–46)
AST: 29 U/L (ref 10–35)
Albumin: 4.5 g/dL (ref 3.6–5.1)
Alkaline phosphatase (APISO): 89 U/L (ref 35–144)
BUN/Creatinine Ratio: 17 (calc) (ref 6–22)
BUN: 24 mg/dL (ref 7–25)
CO2: 24 mmol/L (ref 20–32)
Calcium: 10.1 mg/dL (ref 8.6–10.3)
Chloride: 105 mmol/L (ref 98–110)
Creat: 1.44 mg/dL — ABNORMAL HIGH (ref 0.70–1.35)
Globulin: 3.6 g/dL (calc) (ref 1.9–3.7)
Glucose, Bld: 160 mg/dL — ABNORMAL HIGH (ref 65–99)
Potassium: 5 mmol/L (ref 3.5–5.3)
Sodium: 140 mmol/L (ref 135–146)
Total Bilirubin: 0.4 mg/dL (ref 0.2–1.2)
Total Protein: 8.1 g/dL (ref 6.1–8.1)
eGFR: 53 mL/min/{1.73_m2} — ABNORMAL LOW (ref >=60)

## 2022-07-09 LAB — PSA: PSA: 0.46 ng/mL (ref <=4.00)

## 2022-07-09 LAB — HEMOGLOBIN A1C
Hgb A1c MFr Bld: 8 % of total Hgb — ABNORMAL HIGH (ref <5.7)
Mean Plasma Glucose: 183 mg/dL
eAG (mmol/L): 10.1 mmol/L

## 2022-07-09 LAB — TSH: TSH: 0.68 mIU/L (ref 0.40–4.50)

## 2022-07-29 ENCOUNTER — Ambulatory Visit: Payer: Self-pay | Admitting: *Deleted

## 2022-07-29 ENCOUNTER — Other Ambulatory Visit: Payer: Self-pay | Admitting: Family Medicine

## 2022-07-29 ENCOUNTER — Encounter: Payer: Self-pay | Admitting: Family Medicine

## 2022-07-29 DIAGNOSIS — K047 Periapical abscess without sinus: Secondary | ICD-10-CM

## 2022-07-29 DIAGNOSIS — G4700 Insomnia, unspecified: Secondary | ICD-10-CM

## 2022-07-29 MED ORDER — ZOLPIDEM TARTRATE 5 MG PO TABS
5.0000 mg | ORAL_TABLET | Freq: Every evening | ORAL | 0 refills | Status: DC | PRN
  Filled 2022-07-29: qty 20, 20d supply, fill #0

## 2022-07-29 MED ORDER — AMOXICILLIN-POT CLAVULANATE 875-125 MG PO TABS: 1.0000 | ORAL_TABLET | Freq: Two times a day (BID) | ORAL | 0 refills | Status: DC

## 2022-07-29 NOTE — Telephone Encounter (Signed)
  Chief Complaint: Has an appt. With dentist on 3/25 for a root canal due to an abscess.   He is requesting an antibiotic to help with the infection and pain until then. Symptoms: In a lot of dental pain as a result of an abscess Frequency: Been waiting for 5 weeks to see a dentist. Pertinent Negatives: Patient denies N/A Disposition: [] ED /[] Urgent Care (no appt availability in office) / [] Appointment(In office/virtual)/ []  [place] [facility]/ [] Home Care/ [] Refused Recommended Disposition /[] [place] [facility]/ [x]  Follow-up with PCP Additional Notes: Message sent to Dr. Parks Ranger.   If he is willing to call in an antibiotic call to the [facility] down the street from the office.

## 2022-07-29 NOTE — Addendum Note (Signed)
Addended by: [name] on: 07/29/2022 03:51 PM   Modules accepted: Orders

## 2022-07-29 NOTE — Telephone Encounter (Signed)
Please notify him that I will agree to send in rx Augmentin for him to take until he sees Dentist. Thanks!  Nobie Putnam, [facility] 07/29/2022, 3:51 PM

## 2022-07-29 NOTE — Telephone Encounter (Signed)
Message from Richard Oconnell sent at 07/29/2022  2:32 PM EDT  Summary: abscess on tooth   Pt states that if you cannot reach to please leave a voice mail  ----- Message from Richard Oconnell sent at 07/29/2022  2:31 PM EDT ----- Pt states that he has an abscess on his tooth and he is in pain and mouth is swollen. Pt has an appt with Letha Cape, DDS PA  on 08/09/22 to get the root canal. Pt is wanting to see if PCP can call him in an antibiotic. Pt has been using Tylenol and Orajel and it is not working.   Please call to advise.          Call History   Type Contact Phone/Fax User  07/29/2022 02:26 PM EDT Phone (Incoming) Donyae, Tripp (Self) [phone] Jerilynn Mages) Allred, [place]   Reason for Disposition  Toothache present > 24 hours    Has an appt with dentist on 08/09/2022 for a root canal.  Requesting antibiotic to help with the infection and pain.  Answer Assessment - Initial Assessment Questions 1. LOCATION: "Which tooth is hurting?"  (e.g., right-side/left-side, upper/lower, front/back)     He has an abscess and has a dental appt for 08/09/2022 with Dr Aleda Grana for a root canal.    Would Dr. Raliegh Ip. Be willing to order an antibiotic for me to help with the infection and pain until I'm seen on the 25th? Orajel only lasts 20 minutes.   I'm taking Ambien to help me sleep due to the pain.    2. ONSET: "When did the toothache start?"  (e.g., hours, days)     It's swelling and hurting.    I'm taking Tylenol.   I've been waiting 5 weeks to get in and see this kind of dentist.    3. SEVERITY: "How bad is the toothache?"  (Scale 1-10; mild, moderate or severe)   - MILD (1-3): doesn't interfere with chewing    - MODERATE (4-7): interferes with chewing, interferes with normal activities, awakens from sleep     - SEVERE (8-10): unable to eat, unable to do any normal activities, excruciating pain        Moderate 4. SWELLING: "Is there any visible swelling of your face?"     Yes  5.  OTHER SYMPTOMS: "Do you have any other symptoms?" (e.g., fever)     No 6. PREGNANCY: "Is there any chance you are pregnant?" "When was your last menstrual period?"    N/A  Protocols used: Toothache-A-AH

## 2022-07-30 ENCOUNTER — Encounter: Payer: Self-pay | Admitting: Family Medicine

## 2022-07-30 ENCOUNTER — Other Ambulatory Visit (HOSPITAL_BASED_OUTPATIENT_CLINIC_OR_DEPARTMENT_OTHER): Payer: Self-pay

## 2022-07-30 ENCOUNTER — Other Ambulatory Visit (HOSPITAL_COMMUNITY): Payer: Self-pay

## 2022-07-30 ENCOUNTER — Encounter (HOSPITAL_COMMUNITY): Payer: Self-pay

## 2022-07-30 ENCOUNTER — Other Ambulatory Visit: Payer: Self-pay

## 2022-08-09 DIAGNOSIS — K080 Exfoliation of teeth due to systemic causes: Secondary | ICD-10-CM | POA: Diagnosis not present

## 2022-09-14 ENCOUNTER — Telehealth: Payer: Self-pay

## 2022-09-14 MED ORDER — SEMAGLUTIDE (1 MG/DOSE) 4 MG/3ML ~~LOC~~ SOPN: 1.0000 mg | PEN_INJECTOR | SUBCUTANEOUS | 0 refills | Status: DC

## 2022-09-14 NOTE — Telephone Encounter (Signed)
Patient assistance samples of ozempic received.  Attempted to call patient but no answer.  Mychart message sent.  Med list updated.

## 2022-10-21 ENCOUNTER — Ambulatory Visit (INDEPENDENT_AMBULATORY_CARE_PROVIDER_SITE_OTHER): Payer: Medicare Other

## 2022-10-21 VITALS — Ht 76.0 in | Wt 274.0 lb

## 2022-10-21 DIAGNOSIS — Z0000 Encounter for general adult medical examination without abnormal findings: Secondary | ICD-10-CM

## 2022-10-21 NOTE — Progress Notes (Signed)
I connected with  Richard Oconnell on 10/21/22 by a audio enabled telemedicine application and verified that I am speaking with the correct person using two identifiers.  Patient Location: Home  Provider Location: Office/Clinic  I discussed the limitations of evaluation and management by telemedicine. The patient expressed understanding and agreed to proceed.  Subjective:   Richard Oconnell is a 67 y.o. male who presents for Medicare Annual/Subsequent preventive examination.  Review of Systems     Cardiac Risk Factors include: advanced age (>52men, >1 women);diabetes mellitus;hypertension;male gender     Objective:    Today's Vitals   10/21/22 1420  Weight: 274 lb (124.3 kg)  Height: 6\' 4"  (1.93 m)   Body mass index is 33.35 kg/m.     10/21/2022    2:11 PM 10/10/2020    4:00 AM 09/25/2020    2:40 PM 09/14/2020   10:42 AM 09/10/2020    8:08 AM 09/08/2020    7:23 AM 08/28/2020    3:30 PM  Advanced Directives  Does Patient Have a Medical Advance Directive? Yes  No No No No No  Type of Estate agent of [place];Living will        Does patient want to make changes to medical advance directive? No - Patient declined        Copy of Healthcare Power of Attorney in Chart? Yes - validated most recent copy scanned in chart (See row information)        Would patient like information on creating a medical advance directive?  No - Patient declined  No - Patient declined No - Patient declined No - Patient declined     Current Medications (verified) Outpatient Encounter Medications as of 10/21/2022  Medication Sig   apixaban (ELIQUIS) 5 MG TABS tablet Take 1 tablet (5 mg total) by mouth 2 (two) times daily.   ciclopirox (PENLAC) 8 % solution Apply topically at bedtime. Apply over nail and surrounding skin. Apply daily over previous coat. After seven (7) days, may remove with alcohol and continue cycle.   metoprolol tartrate (LOPRESSOR) 25 MG tablet Take 1 tablet  (25 mg total) by mouth 2 (two) times daily.   pravastatin (PRAVACHOL) 20 MG tablet Take 1 tablet (20 mg total) by mouth daily.   Semaglutide, 1 MG/DOSE, 4 MG/3ML SOPN Inject 1 mg as directed once a week.   sildenafil (REVATIO) 20 MG tablet Take 1-5 pills about 30 min prior to sex. Start with 1 and increase as needed.   valsartan (DIOVAN) 320 MG tablet Take 320 mg by mouth daily.   zolpidem (AMBIEN) 5 MG tablet Take 1 tablet (5 mg total) by mouth at bedtime as needed for sleep.   amoxicillin-clavulanate (AUGMENTIN) 875-125 MG tablet Take 1 tablet by mouth 2 (two) times daily. (Patient not taking: Reported on 10/21/2022)   No facility-administered encounter medications on file as of 10/21/2022.    Allergies (verified) Cyclobenzaprine and Lisinopril   History: Past Medical History:  Diagnosis Date   Cancer [facility])    Bladder Cancer   Erectile dysfunction associated with type 2 diabetes mellitus (HCC) 05/26/2016   Gross hematuria 11/16/2016   Hypertension 06/11/2015   Type 2 diabetes mellitus with diabetic polyneuropathy, without long-term current use of insulin (HCC) 06/11/2015   Past Surgical History:  Procedure Laterality Date   boil lanceted     hand   COLONOSCOPY WITH PROPOFOL N/A 07/03/2020   Procedure: COLONOSCOPY WITH PROPOFOL;  Surgeon: Wyline Mood, MD;  Location: [facility] ENDOSCOPY;  Service: Gastroenterology;  Laterality: N/A;   CYSTOSCOPY W/ RETROGRADES Bilateral 12/06/2016   Procedure: CYSTOSCOPY WITH RETROGRADE PYELOGRAM;  Surgeon: Vanna Scotland, MD;  Location: [facility] ORS;  Service: Urology;  Laterality: Bilateral;   CYSTOSCOPY W/ RETROGRADES Bilateral 06/27/2017   Procedure: CYSTOSCOPY WITH RETROGRADE PYELOGRAM;  Surgeon: Vanna Scotland, MD;  Location: [facility] ORS;  Service: Urology;  Laterality: Bilateral;   CYSTOSCOPY WITH BIOPSY N/A 06/27/2017   Procedure: CYSTOSCOPY WITH Bladder BIOPSY;  Surgeon: Vanna Scotland, MD;  Location: [facility] ORS;  Service: Urology;  Laterality: N/A;   CYSTOSCOPY  WITH BIOPSY N/A 10/24/2017   Procedure: CYSTOSCOPY WITH Bladder BIOPSY;  Surgeon: Vanna Scotland, MD;  Location: [facility] ORS;  Service: Urology;  Laterality: N/A;   ingrown  Bilateral    ingrown toenail   IR BILIARY DRAIN PLACEMENT WITH CHOLANGIOGRAM  07/10/2020   IR CHOLANGIOGRAM EXISTING TUBE  08/07/2020   IR RADIOLOGIST EVAL & MGMT  08/07/2020   IR RADIOLOGIST EVAL & MGMT  10/29/2020   IR RADIOLOGIST EVAL & MGMT  11/12/2020   TRANSURETHRAL RESECTION OF BLADDER TUMOR N/A 12/06/2016   Procedure: TRANSURETHRAL RESECTION OF BLADDER TUMOR (TURBT) (2-5cm) CLOT EVACUATION;  Surgeon: Vanna Scotland, MD;  Location: [facility] ORS;  Service: Urology;  Laterality: N/A;   TRANSURETHRAL RESECTION OF BLADDER TUMOR N/A 06/27/2017   Procedure: TRANSURETHRAL RESECTION OF BLADDER TUMOR (TURBT);  Surgeon: Vanna Scotland, MD;  Location: [facility] ORS;  Service: Urology;  Laterality: N/A;   TRANSURETHRAL RESECTION OF BLADDER TUMOR WITH MITOMYCIN-C N/A 01/18/2017   Procedure: TRANSURETHRAL RESECTION OF BLADDER TUMOR WITH MITOMYCIN-C-(SMALL);  Surgeon: Vanna Scotland, MD;  Location: [facility] ORS;  Service: Urology;  Laterality: N/A;   Family History  Problem Relation Age of Onset   Heart attack Mother    Heart disease Mother    Parkinson's disease Father    Heart attack Maternal Grandfather    Heart disease Maternal Grandfather    Heart attack Maternal Aunt    Heart attack Maternal Grandmother    Prostate cancer Neg Hx    Bladder Cancer Neg Hx    Kidney cancer Neg Hx    Social History   Socioeconomic History   Marital status: Divorced    Spouse name: Not on file   Number of children: Not on file   Years of education: Not on file   Highest education level: Not on file  Occupational History   Not on file  Tobacco Use   Smoking status: Former    Types: Cigars    Passive exposure: Past   Smokeless tobacco: Never   Tobacco comments:    once a while had one cigar >10 years ago  Vaping Use   Vaping Use: Never used   Substance and Sexual Activity   Alcohol use: No    Alcohol/week: 0.0 standard drinks of alcohol   Drug use: No   Sexual activity: Never  Other Topics Concern   Not on file  Social History Narrative   Lives alone   Social Determinants of Health   Financial Resource Strain: Low Risk  (10/21/2022)   Overall Financial Resource Strain (CARDIA)    Difficulty of Paying Living Expenses: Not hard at all  Food Insecurity: No Food Insecurity (10/21/2022)   Hunger Vital Sign    Worried About Running Out of Food in the Last Year: Never true    Ran Out of Food in the Last Year: Never true  Transportation Needs: No Transportation Needs (10/21/2022)   PRAPARE - Transportation    Lack of Transportation (  Medical): No    Lack of Transportation (Non-Medical): No  Physical Activity: Insufficiently Active (10/21/2022)   Exercise Vital Sign    Days of Exercise per Week: 2 days    Minutes of Exercise per Session: 20 min  Stress: No Stress Concern Present (10/21/2022)   [employer] of Occupational Health - Occupational Stress Questionnaire    Feeling of Stress : Only a little  Social Connections: Socially Isolated (10/21/2022)   Social Connection and Isolation Panel [NHANES]    Frequency of Communication with Friends and Family: More than three times a week    Frequency of Social Gatherings with Friends and Family: Three times a week    Attends Religious Services: Never    Active Member of Clubs or Organizations: No    Attends Banker Meetings: Never    Marital Status: Divorced    Tobacco Counseling Counseling given: Not Answered Tobacco comments: once a while had one cigar >10 years ago   Clinical Intake:  Pre-visit preparation completed: Yes  Pain : No/denies pain     Nutritional Risks: None Diabetes: Yes CBG done?: No Did pt. bring in CBG monitor from home?: No  How often do you need to have someone help you when you read instructions, pamphlets, or other written  materials from your doctor or pharmacy?: 1 - Never  Diabetic?yes Nutrition Risk Assessment:  Has the patient had any N/V/D within the last 2 months?  No  Does the patient have any non-healing wounds?  No  Has the patient had any unintentional weight loss or weight gain?  No   Diabetes:  Is the patient diabetic?  Yes  If diabetic, was a CBG obtained today?  No  Did the patient bring in their glucometer from home?  No  How often do you monitor your CBG's? occasionally.   Financial Strains and Diabetes Management:  Are you having any financial strains with the device, your supplies or your medication? No .  Does the patient want to be seen by Chronic Care Management for management of their diabetes?  No  Would the patient like to be referred to a Nutritionist or for Diabetic Management?  No   Diabetic Exams:  Diabetic Eye Exam: Completed no. Overdue for diabetic eye exam. Pt has been advised about the importance in completing this exam. .  Diabetic Foot Exam: Completed 01/13/22. Pt has been advised about the importance in completing this exam.    Interpreter Needed?: No  Information entered by :: Kennedy Bucker, LPN   Activities of Daily Living    10/21/2022    2:13 PM 07/05/2022   10:43 AM  In your present state of health, do you have any difficulty performing the following activities:  Hearing? 0 0  Vision? 0 0  Difficulty concentrating or making decisions? 0 0  Walking or climbing stairs? 0 0  Dressing or bathing? 0 0  Doing errands, shopping? 0 0  Preparing Food and eating ? N   Using the Toilet? N   In the past six months, have you accidently leaked urine? N   Do you have problems with loss of bowel control? N   Managing your Medications? N   Managing your Finances? N   Housekeeping or managing your Housekeeping? N     Patient Care Team: Smitty Cords, DO as PCP - General (Family Medicine) Ronney Asters, Jackelyn Poling, RPH-CPP as Pharmacist Kemper Durie, RN as  [facility] Care Management  Indicate any recent Medical Services  you may have received from other than [facility] providers in the past year (date may be approximate).     Assessment:   This is a routine wellness examination for [place].  Hearing/Vision screen Hearing Screening - Comments:: No aids  Vision Screening - Comments:: Readers- Dr.Dingeldein   Dietary issues and exercise activities discussed: Current Exercise Habits: Home exercise routine, Type of exercise: walking, Time (Minutes): 20, Frequency (Times/Week): 2, Weekly Exercise (Minutes/Week): 40, Intensity: Mild   Goals Addressed             This Visit's Progress    DIET - EAT MORE FRUITS AND VEGETABLES         Depression Screen    10/21/2022    2:06 PM 07/05/2022   10:43 AM 01/13/2022   11:46 AM 05/29/2021    2:15 PM 10/31/2020   10:32 AM 05/30/2020    9:29 AM 03/09/2019   11:03 AM  PHQ 2/9 Scores  PHQ - 2 Score 0 0 0 2 0 0 0  PHQ- 9 Score 0 0 3 2 0      Fall Risk    10/21/2022    2:12 PM 07/05/2022   10:43 AM 03/04/2022    9:57 AM 02/25/2022    1:50 PM 02/09/2022   11:01 AM  Fall Risk   Falls in the past year? 0 0 0 0 0  Number falls in past yr: 0 0     Injury with Fall? 0 0     Risk for fall due to : No Fall Risks No Fall Risks     Follow up Falls prevention discussed;Falls evaluation completed Falls evaluation completed       FALL RISK PREVENTION PERTAINING TO THE HOME:  Any stairs in or around the home? Yes  If so, are there any without handrails? No  Home free of loose throw rugs in walkways, pet beds, electrical cords, etc? Yes  Adequate lighting in your home to reduce risk of falls? Yes   ASSISTIVE DEVICES UTILIZED TO PREVENT FALLS:  Life alert? No  Use of a cane, walker or w/c? No  Grab bars in the bathroom? No  Shower chair or bench in shower? No  Elevated toilet seat or a handicapped toilet? No    Cognitive Function:        10/21/2022    2:18 PM  6CIT Screen  What Year? 0  points  What month? 0 points  What time? 0 points  Count back from 20 0 points  Months in reverse 0 points  Repeat phrase 0 points  Total Score 0 points    Immunizations Immunization History  Administered Date(s) Administered   Moderna Sars-Covid-2 Vaccination 11/30/2019, 12/28/2019   Pneumococcal Conjugate-13 05/30/2020    TDAP status: Due, Education has been provided regarding the importance of this vaccine. Advised may receive this vaccine at local pharmacy or Health Dept. Aware to provide a copy of the vaccination record if obtained from local pharmacy or Health Dept. Verbalized acceptance and understanding.  Flu Vaccine status: Declined, Education has been provided regarding the importance of this vaccine but patient still declined. Advised may receive this vaccine at local pharmacy or Health Dept. Aware to provide a copy of the vaccination record if obtained from local pharmacy or Health Dept. Verbalized acceptance and understanding.  Pneumococcal vaccine status: Up to date  Covid-19 vaccine status: Completed vaccines  Qualifies for Shingles Vaccine? Yes   Zostavax completed No   Shingrix Completed?: No.    Education  has been provided regarding the importance of this vaccine. Patient has been advised to call insurance company to determine out of pocket expense if they have not yet received this vaccine. Advised may also receive vaccine at local pharmacy or Health Dept. Verbalized acceptance and understanding.  Screening Tests Health Maintenance  Topic Date Due   OPHTHALMOLOGY EXAM  Never done   DTaP/Tdap/Td (1 - Tdap) Never done   Zoster Vaccines- Shingrix (1 of 2) Never done   COVID-19 Vaccine (3 - Moderna risk series) 01/25/2020   Pneumonia Vaccine 78+ Years old (2 of 2 - PPSV23 or PCV20) 07/05/2024 (Originally 05/30/2021)   INFLUENZA VACCINE  12/16/2022   HEMOGLOBIN A1C  01/06/2023   FOOT EXAM  01/14/2023   Diabetic kidney evaluation - eGFR measurement  07/09/2023    Diabetic kidney evaluation - Urine ACR  07/09/2023   Medicare Annual Wellness (AWV)  10/21/2023   Colonoscopy  07/03/2030   Hepatitis C Screening  Completed   HPV VACCINES  Aged Out    Health Maintenance  Health Maintenance Due  Topic Date Due   OPHTHALMOLOGY EXAM  Never done   DTaP/Tdap/Td (1 - Tdap) Never done   Zoster Vaccines- Shingrix (1 of 2) Never done   COVID-19 Vaccine (3 - Moderna risk series) 01/25/2020    Colorectal cancer screening: Type of screening: Colonoscopy. Completed 07/03/20. Repeat every 10 years  Lung Cancer Screening: (Low Dose CT Chest recommended if Age 81-80 years, 30 pack-year currently smoking OR have quit w/in 15years.) does not qualify.     Additional Screening:  Hepatitis C Screening: does qualify; Completed 09/26/20  Vision Screening: Recommended annual ophthalmology exams for early detection of glaucoma and other disorders of the eye. Is the patient up to date with their annual eye exam?  Yes  Who is the provider or what is the name of the office in which the patient attends annual eye exams? Dr.Dingeldein If pt is not established with a provider, would they like to be referred to a provider to establish care? No .   Dental Screening: Recommended annual dental exams for proper oral hygiene  Community Resource Referral / Chronic Care Management: CRR required this visit?  No   CCM required this visit?  No      Plan:     I have personally reviewed and noted the following in the patient's chart:   Medical and social history Use of alcohol, tobacco or illicit drugs  Current medications and supplements including opioid prescriptions. Patient is not currently taking opioid prescriptions. Functional ability and status Nutritional status Physical activity Advanced directives List of other physicians Hospitalizations, surgeries, and ER visits in previous 12 months Vitals Screenings to include cognitive, depression, and falls Referrals and  appointments  In addition, I have reviewed and discussed with patient certain preventive protocols, quality metrics, and best practice recommendations. A written personalized care plan for preventive services as well as general preventive health recommendations were provided to patient.     Hal Hope, LPN   05/22/1094   Nurse Notes: none

## 2022-10-21 NOTE — Patient Instructions (Signed)
[name] , Thank you for taking time to come for your Medicare Wellness Visit. I appreciate your ongoing commitment to your health goals. Please review the following plan we discussed and let me know if I can assist you in the future.   These are the goals we discussed:  Goals      DIET - EAT MORE FRUITS AND VEGETABLES     Pharmacy Goals     Our goal bad cholesterol, or LDL, is less than 70 . This is why it is important to continue taking your pravastatin  Please check your home blood pressure, keep a log of the results and bring this with you to your medical appointments.  Feel free to call me with any questions or concerns. I look forward to our next call!  Estelle Grumbles, PharmD, BCACP Clinical Pharmacist [facility] [phone]        This is a list of the screening recommended for you and due dates:  Health Maintenance  Topic Date Due   Eye exam for diabetics  Never done   DTaP/Tdap/Td vaccine (1 - Tdap) Never done   Zoster (Shingles) Vaccine (1 of 2) Never done   COVID-19 Vaccine (3 - Moderna risk series) 01/25/2020   Pneumonia Vaccine (2 of 2 - PPSV23 or PCV20) 07/05/2024*   Flu Shot  12/16/2022   Hemoglobin A1C  01/06/2023   Complete foot exam   01/14/2023   Yearly kidney function blood test for diabetes  07/09/2023   Yearly kidney health urinalysis for diabetes  07/09/2023   Medicare Annual Wellness Visit  10/21/2023   Colon Cancer Screening  07/03/2030   Hepatitis C Screening  Completed   HPV Vaccine  Aged Out  *Topic was postponed. The date shown is not the original due date.    Advanced directives: no   Conditions/risks identified: none  Next appointment: Follow up in one year for your annual wellness visit. 10/27/23 @ 2:00 pm by phone  Preventive Care 65 Years and Older, Male  Preventive care refers to lifestyle choices and visits with your health care provider that can promote health and wellness. What does preventive  care include? A yearly physical exam. This is also called an annual well check. Dental exams once or twice a year. Routine eye exams. Ask your health care provider how often you should have your eyes checked. Personal lifestyle choices, including: Daily care of your teeth and gums. Regular physical activity. Eating a healthy diet. Avoiding tobacco and drug use. Limiting alcohol use. Practicing safe sex. Taking low doses of aspirin every day. Taking vitamin and mineral supplements as recommended by your health care provider. What happens during an annual well check? The services and screenings done by your health care provider during your annual well check will depend on your age, overall health, lifestyle risk factors, and family history of disease. Counseling  Your health care provider may ask you questions about your: Alcohol use. Tobacco use. Drug use. Emotional well-being. Home and relationship well-being. Sexual activity. Eating habits. History of falls. Memory and ability to understand (cognition). Work and work Astronomer. Screening  You may have the following tests or measurements: Height, weight, and BMI. Blood pressure. Lipid and cholesterol levels. These may be checked every 5 years, or more frequently if you are over 25 years old. Skin check. Lung cancer screening. You may have this screening every year starting at age 40 if you have a 30-pack-year history of smoking and currently smoke  or have quit within the past 15 years. Fecal occult blood test (FOBT) of the stool. You may have this test every year starting at age 51. Flexible sigmoidoscopy or colonoscopy. You may have a sigmoidoscopy every 5 years or a colonoscopy every 10 years starting at age 43. Prostate cancer screening. Recommendations will vary depending on your family history and other risks. Hepatitis C blood test. Hepatitis B blood test. Sexually transmitted disease (STD) testing. Diabetes screening.  This is done by checking your blood sugar (glucose) after you have not eaten for a while (fasting). You may have this done every 1-3 years. Abdominal aortic aneurysm (AAA) screening. You may need this if you are a current or former smoker. Osteoporosis. You may be screened starting at age 72 if you are at high risk. Talk with your health care provider about your test results, treatment options, and if necessary, the need for more tests. Vaccines  Your health care provider may recommend certain vaccines, such as: Influenza vaccine. This is recommended every year. Tetanus, diphtheria, and acellular pertussis (Tdap, Td) vaccine. You may need a Td booster every 10 years. Zoster vaccine. You may need this after age 42. Pneumococcal 13-valent conjugate (PCV13) vaccine. One dose is recommended after age 59. Pneumococcal polysaccharide (PPSV23) vaccine. One dose is recommended after age 26. Talk to your health care provider about which screenings and vaccines you need and how often you need them. This information is not intended to replace advice given to you by your health care provider. Make sure you discuss any questions you have with your health care provider. Document Released: 05/30/2015 Document Revised: 01/21/2016 Document Reviewed: 03/04/2015 Elsevier Interactive Patient Education  2017 [employer].  Fall Prevention in the Home Falls can cause injuries. They can happen to people of all ages. There are many things you can do to make your home safe and to help prevent falls. What can I do on the outside of my home? Regularly fix the edges of walkways and driveways and fix any cracks. Remove anything that might make you trip as you walk through a door, such as a raised step or threshold. Trim any bushes or trees on the path to your home. Use bright outdoor lighting. Clear any walking paths of anything that might make someone trip, such as rocks or tools. Regularly check to see if handrails  are loose or broken. Make sure that both sides of any steps have handrails. Any raised decks and porches should have guardrails on the edges. Have any leaves, snow, or ice cleared regularly. Use sand or salt on walking paths during winter. Clean up any spills in your garage right away. This includes oil or grease spills. What can I do in the bathroom? Use night lights. Install grab bars by the toilet and in the tub and shower. Do not use towel bars as grab bars. Use non-skid mats or decals in the tub or shower. If you need to sit down in the shower, use a plastic, non-slip stool. Keep the floor dry. Clean up any water that spills on the floor as soon as it happens. Remove soap buildup in the tub or shower regularly. Attach bath mats securely with double-sided non-slip rug tape. Do not have throw rugs and other things on the floor that can make you trip. What can I do in the bedroom? Use night lights. Make sure that you have a light by your bed that is easy to reach. Do not use any sheets or blankets  that are too big for your bed. They should not hang down onto the floor. Have a firm chair that has side arms. You can use this for support while you get dressed. Do not have throw rugs and other things on the floor that can make you trip. What can I do in the kitchen? Clean up any spills right away. Avoid walking on wet floors. Keep items that you use a lot in easy-to-reach places. If you need to reach something above you, use a strong step stool that has a grab bar. Keep electrical cords out of the way. Do not use floor polish or wax that makes floors slippery. If you must use wax, use non-skid floor wax. Do not have throw rugs and other things on the floor that can make you trip. What can I do with my stairs? Do not leave any items on the stairs. Make sure that there are handrails on both sides of the stairs and use them. Fix handrails that are broken or loose. Make sure that handrails  are as long as the stairways. Check any carpeting to make sure that it is firmly attached to the stairs. Fix any carpet that is loose or worn. Avoid having throw rugs at the top or bottom of the stairs. If you do have throw rugs, attach them to the floor with carpet tape. Make sure that you have a light switch at the top of the stairs and the bottom of the stairs. If you do not have them, ask someone to add them for you. What else can I do to help prevent falls? Wear shoes that: Do not have high heels. Have rubber bottoms. Are comfortable and fit you well. Are closed at the toe. Do not wear sandals. If you use a stepladder: Make sure that it is fully opened. Do not climb a closed stepladder. Make sure that both sides of the stepladder are locked into place. Ask someone to hold it for you, if possible. Clearly mark and make sure that you can see: Any grab bars or handrails. First and last steps. Where the edge of each step is. Use tools that help you move around (mobility aids) if they are needed. These include: Canes. Walkers. Scooters. Crutches. Turn on the lights when you go into a dark area. Replace any light bulbs as soon as they burn out. Set up your furniture so you have a clear path. Avoid moving your furniture around. If any of your floors are uneven, fix them. If there are any pets around you, be aware of where they are. Review your medicines with your doctor. Some medicines can make you feel dizzy. This can increase your chance of falling. Ask your doctor what other things that you can do to help prevent falls. This information is not intended to replace advice given to you by your health care provider. Make sure you discuss any questions you have with your health care provider. Document Released: 02/27/2009 Document Revised: 10/09/2015 Document Reviewed: 06/07/2014 Elsevier Interactive Patient Education  2017 [employer].

## 2022-12-13 ENCOUNTER — Encounter: Payer: Self-pay | Admitting: Medical

## 2022-12-13 ENCOUNTER — Ambulatory Visit: Payer: Medicare Other | Attending: Medical | Admitting: Medical

## 2022-12-13 VITALS — BP 128/90 | HR 82 | Ht 76.0 in | Wt 275.6 lb

## 2022-12-13 DIAGNOSIS — G4733 Obstructive sleep apnea (adult) (pediatric): Secondary | ICD-10-CM

## 2022-12-13 DIAGNOSIS — E119 Type 2 diabetes mellitus without complications: Secondary | ICD-10-CM

## 2022-12-13 DIAGNOSIS — E782 Mixed hyperlipidemia: Secondary | ICD-10-CM

## 2022-12-13 DIAGNOSIS — Z7984 Long term (current) use of oral hypoglycemic drugs: Secondary | ICD-10-CM

## 2022-12-13 DIAGNOSIS — I10 Essential (primary) hypertension: Secondary | ICD-10-CM | POA: Diagnosis not present

## 2022-12-13 DIAGNOSIS — E785 Hyperlipidemia, unspecified: Secondary | ICD-10-CM

## 2022-12-13 DIAGNOSIS — E1169 Type 2 diabetes mellitus with other specified complication: Secondary | ICD-10-CM

## 2022-12-13 DIAGNOSIS — I480 Paroxysmal atrial fibrillation: Secondary | ICD-10-CM | POA: Diagnosis not present

## 2022-12-13 MED ORDER — PRAVASTATIN SODIUM 20 MG PO TABS: 20.0000 mg | ORAL_TABLET | Freq: Every day | ORAL | 3 refills | Status: DC

## 2022-12-13 NOTE — Patient Instructions (Signed)
Medication Instructions:  Your physician recommends that you continue on your current medications as directed. Please refer to the Current Medication list given to you today.  *If you need a refill on your cardiac medications before your next appointment, please call your pharmacy*   Lab Work: None ordered today   Testing/Procedures: None ordered today   Follow-Up: At [facility], you and your health needs are our priority.  As part of our continuing mission to provide you with exceptional heart care, we have created designated Provider Care Teams.  These Care Teams include your primary Cardiologist (physician) and Advanced Practice Providers (APPs -  Physician Assistants and Nurse Practitioners) who all work together to provide you with the care you need, when you need it.  We recommend signing up for the patient portal called "MyChart".  Sign up information is provided on this After Visit Summary.  MyChart is used to connect with patients for Virtual Visits (Telemedicine).  Patients are able to view lab/test results, encounter notes, upcoming appointments, etc.  Non-urgent messages can be sent to your provider as well.   To learn more about what you can do with MyChart, go to [email or web address].    Your next appointment:   1 year(s)  Provider:   Terrilee Croak, PA-C

## 2022-12-13 NOTE — Progress Notes (Signed)
Cardiology Office Note:    Date:  12/13/2022   ID:  Richard Oconnell, DOB 17-Aug-1955, MRN [id number]  PCP:  Smitty Cords, DO  [facility] Cardiologist:  None  [facility] Electrophysiologist:  None   Referring MD: Saralyn Pilar *   Chief Complaint: 1 year follow-up  History of Present Illness:    Richard Oconnell is a 67 y.o. male with a hx of HTN, DM2, bladder CA, OSA not on CPAP, Pafib on Eliquis who presents for 6 month follow-up.    He presented to [facility] 09/25/2020 with large abdominal abscess and severe septic shock with bacteremia and multiorgan failure, complicated by severe metabolic acidosis and toxic metabolic encephalopathy. Per family, the patient became disoriented and then skin appeared jaundiced, leading him to present to [facility]. Cardiology consulted for possible PAF with OAC not recommended in the setting of high risk for bleeding with liver dysfunction and large abscess. Amiodarone was noted as reportedly discontinued in the setting of junctional rhythm. On review of previous EKGs at time of consultation, the team was able to find evidence of atrial fibrillation from 07/09/2020, though on review of prior to admission medications, he had not been maintained on anticoagulation since Afib EKG on February 2022. He was initially started on amiodarone for his atrial fibrillation; however, this was discontinued due to bradycardia/junctional rhythm.  He was initially not started on any anticoagulation due to his comorbidities at the time of admission and including sepsis, abdominal abscess with drainage, and elevated LFTs.  By discharge, he was anticoagulated with Eliquis.  Due to elevated BP, it was noted that he should start on ACE/ARB if renal function would allow at RTC.   09/27/2020 echo showed EF 60 to 65%, NR WMA, G2 DD, RVSP 21.5 mmHg, and no valve vegetation.  Patient was last seen in June 2023 and was overall doing well from a cardiac perspective,  in sinus rhythm.  Today, the patient reports he is overall doing well. He had cysts removed from his back. He hasn't been able to exercise that much. He denies chest pain, SOB, LLE, orthopnea or pnd. Bps at home are normal. 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 He is needing refills of his statin medication.    Past Medical History:  Diagnosis Date   Cancer [facility])    Bladder Cancer   Erectile dysfunction associated with type 2 diabetes mellitus (HCC) 05/26/2016   Gross hematuria 11/16/2016   Hypertension 06/11/2015   Type 2 diabetes mellitus with diabetic polyneuropathy, without long-term current use of insulin (HCC) 06/11/2015    Past Surgical History:  Procedure Laterality Date   boil lanceted     hand   COLONOSCOPY WITH PROPOFOL N/A 07/03/2020   Procedure: COLONOSCOPY WITH PROPOFOL;  Surgeon: Wyline Mood, MD;  [facility] ENDOSCOPY;  Service: Gastroenterology;  Laterality: N/A;   CYSTOSCOPY W/ RETROGRADES Bilateral 12/06/2016   Procedure: CYSTOSCOPY WITH RETROGRADE PYELOGRAM;  Surgeon: Vanna Scotland, MD;  Location: [facility] ORS;  Service: Urology;  Laterality: Bilateral;   CYSTOSCOPY W/ RETROGRADES Bilateral 06/27/2017   Procedure: CYSTOSCOPY WITH RETROGRADE PYELOGRAM;  Surgeon: Vanna Scotland, MD;  Location: [facility] ORS;  Service: Urology;  Laterality: Bilateral;   CYSTOSCOPY WITH BIOPSY N/A 06/27/2017   Procedure: CYSTOSCOPY WITH Bladder BIOPSY;  Surgeon: Vanna Scotland, MD;  Location: [facility] ORS;  Service: Urology;  Laterality: N/A;   CYSTOSCOPY WITH BIOPSY N/A 10/24/2017   Procedure: CYSTOSCOPY WITH Bladder BIOPSY;  Surgeon: Vanna Scotland, MD;  Location: [facility] ORS;  Service:  Urology;  Laterality: N/A;   ingrown  Bilateral    ingrown toenail   IR BILIARY DRAIN PLACEMENT WITH CHOLANGIOGRAM  07/10/2020   IR CHOLANGIOGRAM EXISTING TUBE  08/07/2020   IR RADIOLOGIST EVAL & MGMT  08/07/2020   IR RADIOLOGIST EVAL & MGMT  10/29/2020   IR RADIOLOGIST EVAL & MGMT  11/12/2020   TRANSURETHRAL  RESECTION OF BLADDER TUMOR N/A 12/06/2016   Procedure: TRANSURETHRAL RESECTION OF BLADDER TUMOR (TURBT) (2-5cm) CLOT EVACUATION;  Surgeon: Vanna Scotland, MD;  Location: [facility] ORS;  Service: Urology;  Laterality: N/A;   TRANSURETHRAL RESECTION OF BLADDER TUMOR N/A 06/27/2017   Procedure: TRANSURETHRAL RESECTION OF BLADDER TUMOR (TURBT);  Surgeon: Vanna Scotland, MD;  Location: [facility] ORS;  Service: Urology;  Laterality: N/A;   TRANSURETHRAL RESECTION OF BLADDER TUMOR WITH MITOMYCIN-C N/A 01/18/2017   Procedure: TRANSURETHRAL RESECTION OF BLADDER TUMOR WITH MITOMYCIN-C-(SMALL);  Surgeon: Vanna Scotland, MD;  Location: [facility] ORS;  Service: Urology;  Laterality: N/A;    Current Medications: Current Meds  Medication Sig   apixaban (ELIQUIS) 5 MG TABS tablet Take 1 tablet (5 mg total) by mouth 2 (two) times daily.   metoprolol tartrate (LOPRESSOR) 25 MG tablet Take 1 tablet (25 mg total) by mouth 2 (two) times daily.   pravastatin (PRAVACHOL) 20 MG tablet Take 1 tablet (20 mg total) by mouth daily.   Semaglutide, 1 MG/DOSE, 4 MG/3ML SOPN Inject 1 mg as directed once a week.   sildenafil (REVATIO) 20 MG tablet Take 1-5 pills about 30 min prior to sex. Start with 1 and increase as needed.   valsartan (DIOVAN) 320 MG tablet Take 320 mg by mouth daily.   zolpidem (AMBIEN) 5 MG tablet Take 1 tablet (5 mg total) by mouth at bedtime as needed for sleep.     Allergies:   Cyclobenzaprine and Lisinopril   Social History   Socioeconomic History   Marital status: Divorced    Spouse name: Not on file   Number of children: Not on file   Years of education: Not on file   Highest education level: Not on file  Occupational History   Not on file  Tobacco Use   Smoking status: Former    Types: Cigars    Passive exposure: Past   Smokeless tobacco: Never   Tobacco comments:    once a while had one cigar >10 years ago  Vaping Use   Vaping status: Never Used  Substance and Sexual Activity   Alcohol use: No     Alcohol/week: 0.0 standard drinks of alcohol   Drug use: No   Sexual activity: Never  Other Topics Concern   Not on file  Social History Narrative   Lives alone   Social Determinants of Health   Financial Resource Strain: Low Risk  (10/21/2022)   Overall Financial Resource Strain (CARDIA)    Difficulty of Paying Living Expenses: Not hard at all  Food Insecurity: No Food Insecurity (10/21/2022)   Hunger Vital Sign    Worried About Running Out of Food in the Last Year: Never true    Ran Out of Food in the Last Year: Never true  Transportation Needs: No Transportation Needs (10/21/2022)   PRAPARE - Administrator, Civil Service (Medical): No    Lack of Transportation (Non-Medical): No  Physical Activity: Insufficiently Active (10/21/2022)   Exercise Vital Sign    Days of Exercise per Week: 2 days    Minutes of Exercise per Session: 20 min  Stress: No Stress  Concern Present (10/21/2022)   [employer] of Occupational Health - Occupational Stress Questionnaire    Feeling of Stress : Only a little  Social Connections: Socially Isolated (10/21/2022)   Social Connection and Isolation Panel [NHANES]    Frequency of Communication with Friends and Family: More than three times a week    Frequency of Social Gatherings with Friends and Family: Three times a week    Attends Religious Services: Never    Active Member of Clubs or Organizations: No    Attends Engineer, structural: Never    Marital Status: Divorced     Family History: The patient's family history includes Heart attack in his maternal aunt, maternal grandfather, maternal grandmother, and mother; Heart disease in his maternal grandfather and mother; Parkinson's disease in his father. There is no history of Prostate cancer, Bladder Cancer, or Kidney cancer.  ROS:   Please see the history of present illness.     All other systems reviewed and are negative.  EKGs/Labs/Other Studies Reviewed:    The following  studies were reviewed today:  Echo 2022  1. Left ventricular ejection fraction, by estimation, is 60 to 65%. The  left ventricle has normal function. The left ventricle has no regional  wall motion abnormalities. Left ventricular diastolic parameters are  consistent with Grade II diastolic  dysfunction (pseudonormalization).   2. Right ventricular systolic function is normal. The right ventricular  size is normal. There is normal pulmonary artery systolic pressure. The  estimated right ventricular systolic pressure is 21.5 mmHg.   3. The mitral valve is normal in structure. No evidence of mitral valve  regurgitation. No evidence of mitral stenosis.   4. No valve vegetation.    EKG:  EKG is ordered today.  The ekg ordered today demonstrates NSR 1st degree AV block, LAD, I LBBB  Recent Labs: 07/08/2022: ALT 27; BUN 24; Creat 1.44; Hemoglobin 17.4; Platelets 312; Potassium 5.0; Sodium 140; TSH 0.68  Recent Lipid Panel    Component Value Date/Time   CHOL 157 07/08/2022 1001   TRIG 338 (H) 07/08/2022 1001   HDL 39 (L) 07/08/2022 1001   CHOLHDL 4.0 07/08/2022 1001   VLDL 51 (H) 11/24/2016 0818   LDLCALC 77 07/08/2022 1001    Physical Exam:    VS:  BP (!) 128/90   Pulse 82   Ht 6\' 4"  (1.93 m)   Wt 275 lb 9.6 oz (125 kg)   SpO2 98%   BMI 33.55 kg/m     Wt Readings from Last 3 Encounters:  12/13/22 275 lb 9.6 oz (125 kg)  10/21/22 274 lb (124.3 kg)  07/05/22 274 lb (124.3 kg)     GEN:  Well nourished, well developed in no acute distress HEENT: Normal NECK: No JVD; No carotid bruits LYMPHATICS: No lymphadenopathy CARDIAC: RRR, no murmurs, rubs, gallops RESPIRATORY:  Clear to auscultation without rales, wheezing or rhonchi  ABDOMEN: Soft, non-tender, non-distended MUSCULOSKELETAL:  No edema; No deformity  SKIN: Warm and dry NEUROLOGIC:  Alert and oriented x 3 PSYCHIATRIC:  Normal affect   ASSESSMENT:    1. Paroxysmal A-fib (HCC)   2. Essential hypertension   3. OSA  (obstructive sleep apnea)   4. Diabetes mellitus with coincident hypertension (HCC)   5. Hyperlipidemia, mixed    PLAN:    In order of problems listed above:  Paroxysmal Afib EKG today shows NSR. He takes Eliquis 5mg  BID for stroke ppx. Continue Lopressor 25mg  BID for rate control. Labs in 06/2022 were  stable  HTN BP is normal today, continue Lopressor and Valsartan 320 mg daily.   OSA He is not on CPAP, say he does not have sleep apnea  DM2 A1C 8.0. This is followed by PCP.   HLD LDL 77, HDL 39, TG 338 in 05/6107. Continue Pravastatin 20mg  daily, we will refill this. I recommended diet changes.  Disposition: Follow up in 1 year(s) with MD/APP    Signed, Kert Shackett David Stall, PA-C  12/13/2022 2:34 PM    [facility]

## 2022-12-28 ENCOUNTER — Telehealth: Payer: Self-pay

## 2022-12-28 NOTE — Telephone Encounter (Signed)
Informed patient his ozempic samples are ready for pick up.

## 2022-12-31 IMAGING — RF IR CHOLANGIOGRAM VIA EXIST CATHETER
3 series · 7 of 7 positions shown · non-contrast
Comparison: none

INDICATION: Cholecystitis, status post cholecystostomy 07/10/2020

[Series 1: one shot · 1 of 1 slices shown (1 of 2)]
[im 1/1]
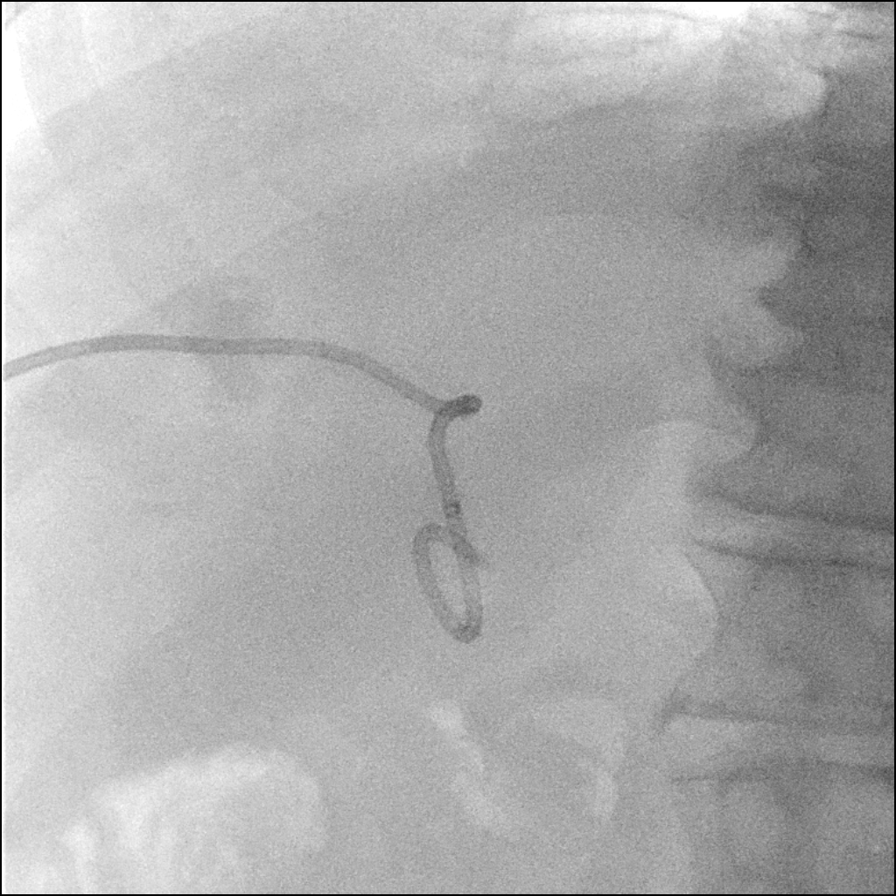

[Series 2: sequence · 4 of 115 frames shown]
[frame 18/115]
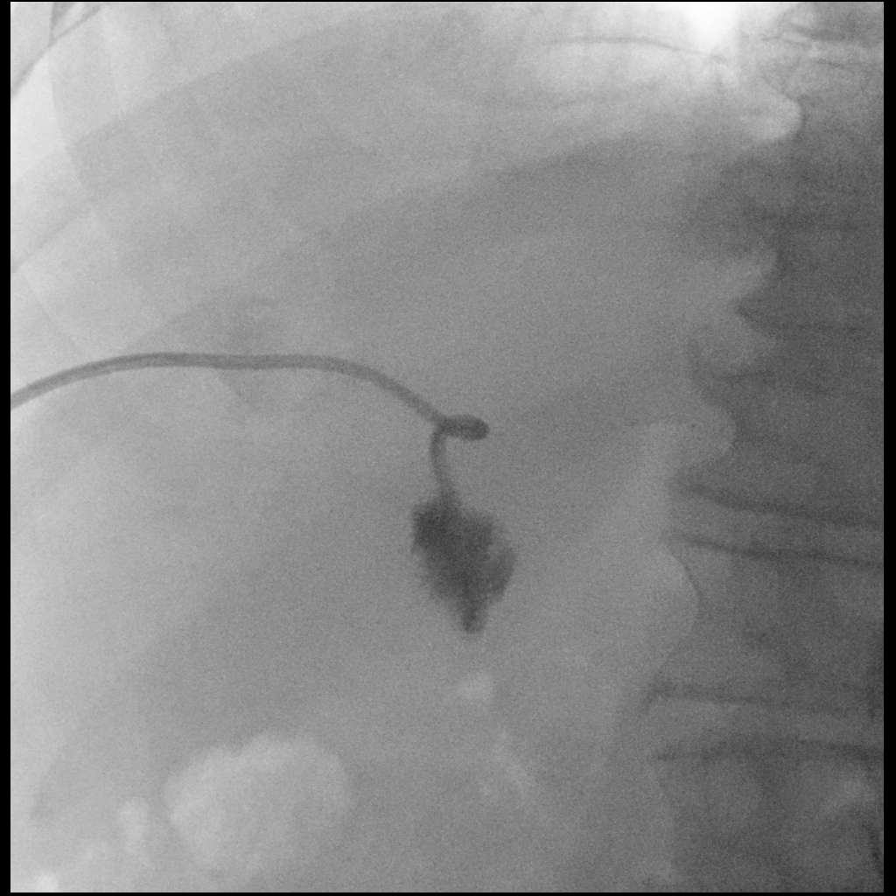
[frame 20/115]
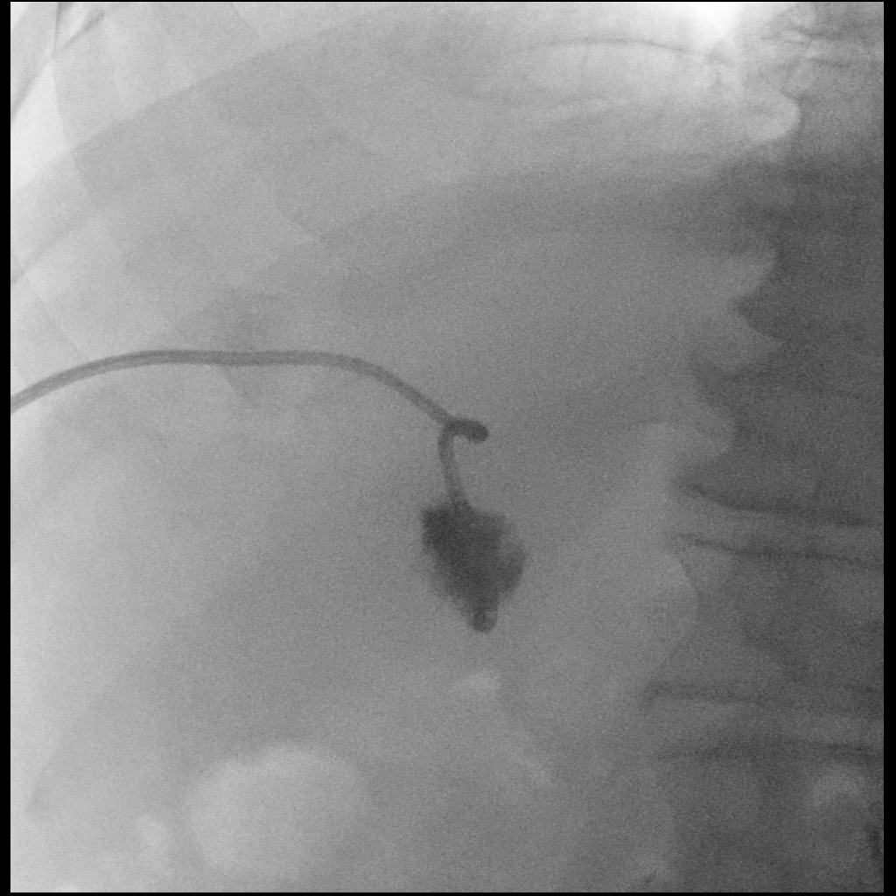
[frame 58/115]
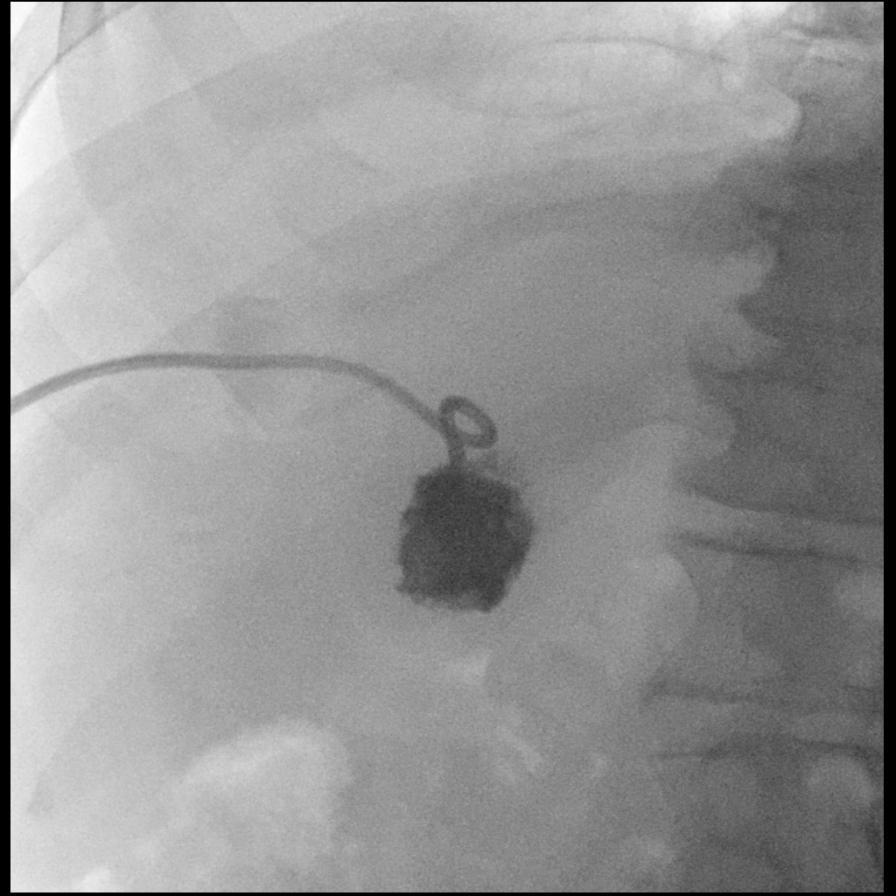
[frame 98/115]
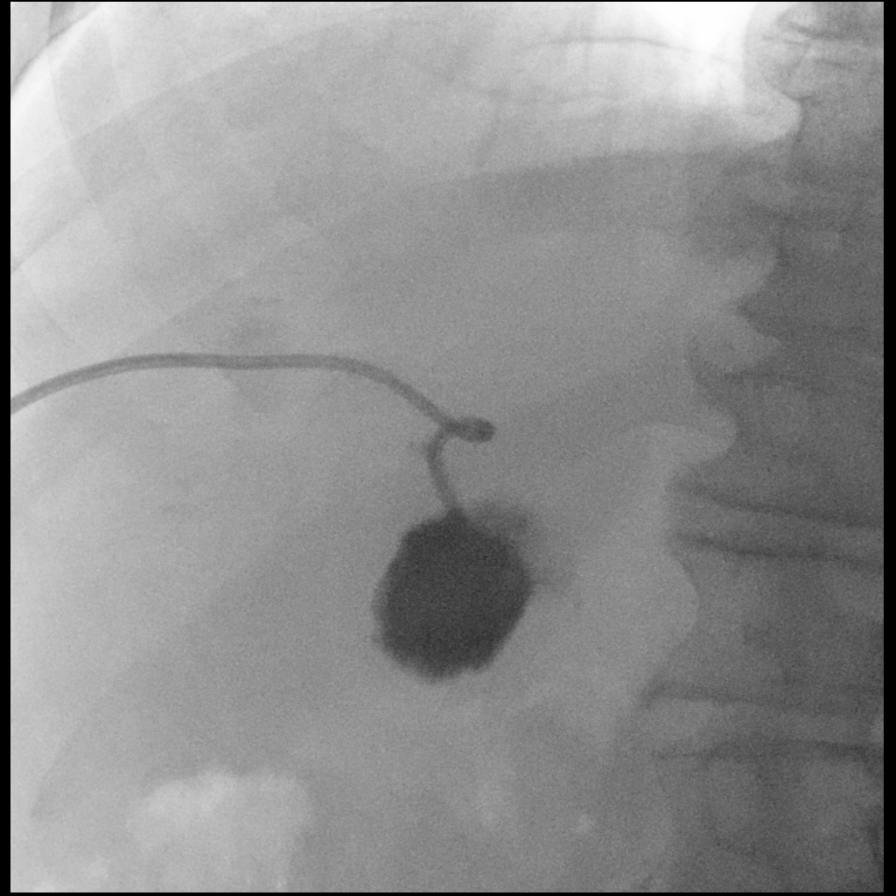

[Series 3: one shot · 2 of 2 slices shown (2 of 2)]
[im 1/2]
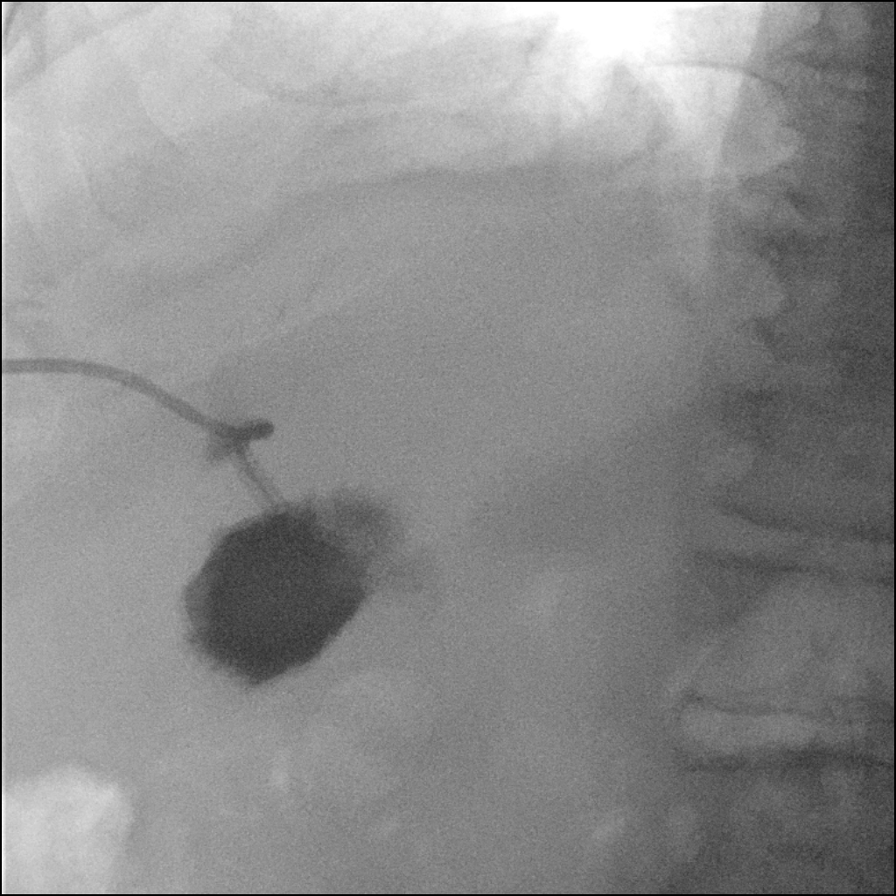
[im 2/2]
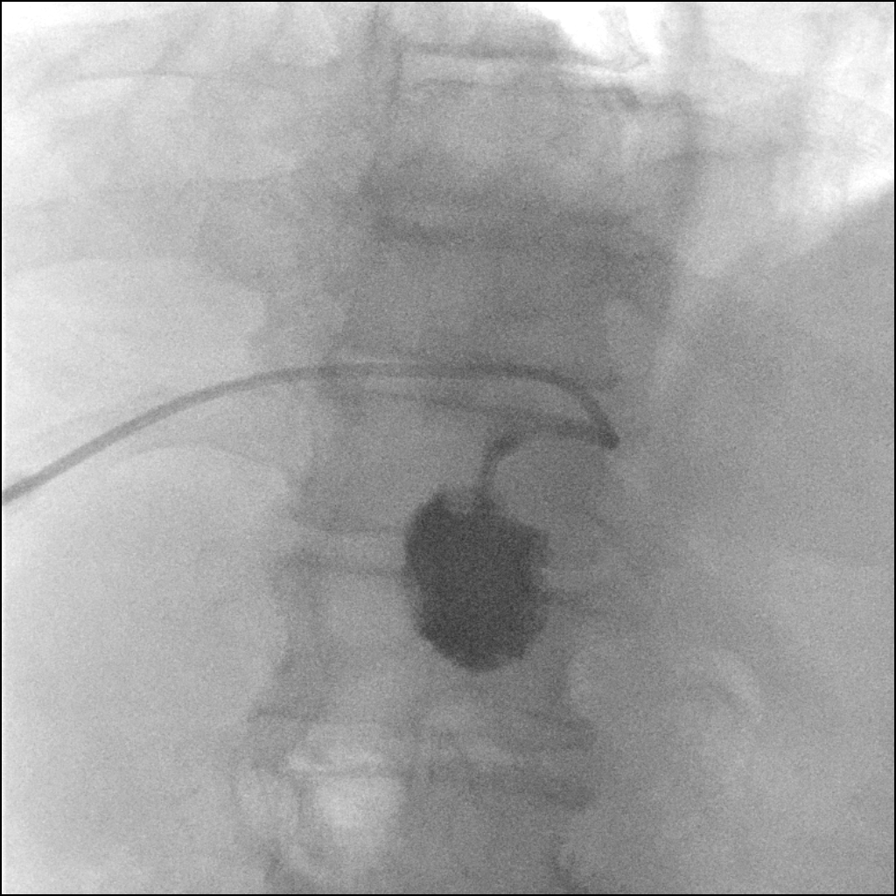

[7 of 7 positions shown; findings below may reference images not displayed]

EXAM:
CHOLECYSTOSTOMY DRAIN INJECTION

MEDICATIONS:
None.

ANESTHESIA/SEDATION:
Moderate Sedation Time: None.

FLUOROSCOPY TIME:  Fluoroscopy Time: 0 minutes 48 seconds (872 mGy).

COMPLICATIONS:
None immediate.

PROCEDURE:
Informed written consent was obtained from the patient after a
thorough discussion of the procedural risks, benefits and
alternatives. All questions were addressed. Maximal Sterile Barrier
Technique was utilized including caps, mask, sterile gowns, sterile
gloves, sterile drape, hand hygiene and skin antiseptic. A timeout
was performed prior to the initiation of the procedure.

Existing cholecystostomy was injected with contrast. Fluoroscopic
imaging performed. Cholecystostomy remains within the collapsed
gallbladder. Cystic duct not visualized consistent with cystic duct
obstruction.
IMPRESSION: Stable cholecystostomy position.

Positive exam for cystic duct obstruction.

## 2023-02-18 ENCOUNTER — Encounter: Payer: Self-pay | Admitting: Family Medicine

## 2023-03-18 ENCOUNTER — Ambulatory Visit: Payer: Medicare Other | Admitting: Pharmacist

## 2023-03-18 ENCOUNTER — Encounter: Payer: Self-pay | Admitting: Pharmacist

## 2023-03-18 DIAGNOSIS — E1142 Type 2 diabetes mellitus with diabetic polyneuropathy: Secondary | ICD-10-CM

## 2023-03-18 NOTE — Patient Instructions (Signed)
Goals Addressed             This Visit's Progress    Pharmacy Goals       Please watch the mail for an envelope from [facility] containing the patient assistance program application. Please complete this application and mail back to [facility] Pharmacy Technician Noreene Larsson Simcox along with a copy of your Medicare Part D prescription card and a copy of your proof of income document OR you can bring these documents to the office to have them faxed back to Attention: Pattricia Boss at Fax # [phone]   If you need to call Noreene Larsson, you can reach her at [phone]  If you need to reach out to patient assistance programs regarding refills or to find out the status of your application, you can do so by calling:  [employer] at [phone]  Our goal bad cholesterol, or LDL, is less than 70 . This is why it is important to continue taking your pravastatin  Please check your home blood pressure, keep a log of the results and bring this with you to your medical appointments.  Feel free to call me with any questions or concerns. I look forward to our next call!  Estelle Grumbles, PharmD, [facility] Clinical Pharmacist [facility] [phone]

## 2023-03-18 NOTE — Progress Notes (Signed)
03/18/2023 Name: Richard Oconnell MRN: [id number] DOB: 30-Jan-1956  Chief Complaint  Patient presents with   Medication Assistance   Medication Management    Richard Oconnell is a 67 y.o. year old male who presented for a telephone visit.   They were referred to the pharmacist by their PCP for assistance in managing medication access.      Subjective:   Care Team: Primary Care Provider: Smitty Cords, DO Cardiologist: [facility]  Medication Access/Adherence  Current Pharmacy:  [facility] - [place], [place] - [address] [place] [address] Phone: [phone] Fax: [phone]  [facility] - [place], [place] - [address] [place] [address] Phone: [phone] Fax: [phone]  [facility][id number] [place], [place] - [address] AT [facility] [address] [place] [address] Phone: [phone] Fax: [phone]   Patient reports affordability concerns with their medications: No  Patient reports access/transportation concerns to their pharmacy: No  Patient reports adherence concerns with their medications:  No     Denies missed doses   Diabetes:   Current medications: Ozempic 1 mg weekly on Thursdays             Reports tolerating well   Recalls home morning fasting blood sugar ranging 111-137   Denies symptoms of hypoglycemia    Exercise: reports staying active recently with yard work   Statin: pravastatin 20 mg daily   Current medication access support: Enrolled in patient assistance for Ozempic from [employer] patient assistance program through 05/17/2023 - Reports has sufficient supply of Ozempic to last through end of calendar year    Atrial Fibrillation/Hypertension: Current medications: Eliquis 5 mg twice daily Metoprolol 25 mg twice daily valsartan 320mg  daily   Patient has an upper arm home BP cuff Recalls last checked home blood  pressure: yesterday, reading 131/80, HR 80 - Reports reading after active outside   Denies symptoms of hypotension including dizziness/lightheadedness   Exercise: reports staying active recently with yard work   Objective:  Lab Results  Component Value Date   HGBA1C 8.0 (H) 07/08/2022    Lab Results  Component Value Date   CREATININE 1.44 (H) 07/08/2022   BUN 24 07/08/2022   NA 140 07/08/2022   K 5.0 07/08/2022   CL 105 07/08/2022   CO2 24 07/08/2022    Lab Results  Component Value Date   CHOL 157 07/08/2022   HDL 39 (L) 07/08/2022   LDLCALC 77 07/08/2022   TRIG 338 (H) 07/08/2022   CHOLHDL 4.0 07/08/2022   BP Readings from Last 3 Encounters:  12/13/22 (!) 128/90  07/05/22 108/70  03/18/22 137/85   Pulse Readings from Last 3 Encounters:  12/13/22 82  07/05/22 84  03/18/22 79     Medications Reviewed Today     Reviewed by Manuela Neptune, RPH-CPP (Pharmacist) on 03/18/23 at 1154  Med List Status: <None>   Medication Order Taking? Sig Documenting Provider Last Dose Status Informant  apixaban (ELIQUIS) 5 MG TABS tablet [id number] Yes Take 1 tablet (5 mg total) by mouth 2 (two) times daily. Smitty Cords, DO Taking Active   metoprolol tartrate (LOPRESSOR) 25 MG tablet [id number] Yes Take 1 tablet (25 mg total) by mouth 2 (two) times daily. Smitty Cords, DO Taking Active   pravastatin (PRAVACHOL) 20 MG tablet [id number] Yes Take 1 tablet (  20 mg total) by mouth daily. Furth, Cadence H, PA-C Taking Active   Semaglutide, 1 MG/DOSE, 4 MG/3ML SOPN [id number] Yes Inject 1 mg as directed once a week. Smitty Cords, DO Taking Active   sildenafil (REVATIO) 20 MG tablet [id number]  Take 1-5 pills about 30 min prior to sex. Start with 1 and increase as needed. Karamalegos, Netta Neat, DO  Active   valsartan (DIOVAN) 320 MG tablet [id number] Yes Take 320 mg by mouth daily. [provider] Taking Active   zolpidem (AMBIEN) 5 MG tablet  [id number]  Take 1 tablet (5 mg total) by mouth at bedtime as needed for sleep. Smitty Cords, DO  Active               Assessment/Plan:   Encourage patient to contact office to schedule follow up office visit with PCP and lab appointments   Diabetes: - Reviewed long term cardiovascular and renal outcomes of uncontrolled blood sugar - Reviewed goal A1c, goal fasting, and goal 2 hour post prandial glucose - Reviewed dietary modifications including encourage patient to have regular well-balanced meals throughout the day, while limiting carbohydrate portion sizes  - Recommended to monitor home blood sugar, keep log of results, bring this record to medical appointment and contact office if readings outside of established parameters - Will collaborate with PCP and CPhT to aid patient with applying for re-enrollment in patient assistance for Ozempic from [employer] for 2025.   Hypertension/Atrial Fibrillation: - Recommended to monitor home BP and HR, keep log of results, bring this record to medical appointment and contact office if readings outside of established parameters     Follow Up Plan: Clinical Pharmacist to outreach to patient by telephone on 06/03/2023 at 11:45 AM    Estelle Grumbles, PharmD, P[place]y [place], CPP Clinical Pharmacist [facility] [phone]

## 2023-03-22 ENCOUNTER — Other Ambulatory Visit: Payer: Self-pay | Admitting: Family Medicine

## 2023-03-22 DIAGNOSIS — I10 Essential (primary) hypertension: Secondary | ICD-10-CM

## 2023-03-22 DIAGNOSIS — I480 Paroxysmal atrial fibrillation: Secondary | ICD-10-CM

## 2023-03-23 NOTE — Telephone Encounter (Signed)
Requested medication (s) are due for refill today:yes  Requested medication (s) are on the active medication list:yes  Last refill:  01/14/23 #180 3 RF  Future visit scheduled: no  Notes to clinic:  needs appointment- sent pt message via MyChart to call and make appointment   Requested Prescriptions  Pending Prescriptions Disp Refills   metoprolol tartrate (LOPRESSOR) 25 MG tablet [Pharmacy Med Name: METOPROLOL TARTRATE 25 MG TAB] 180 tablet 0    Sig: Take 1 tablet(25 mg total) by mouth 2 (two) times daily.     Cardiovascular:  Beta Blockers Failed - 03/22/2023 12:09 PM      Failed - Last BP in normal range    BP Readings from Last 1 Encounters:  12/13/22 (!) 128/90         Passed - Last Heart Rate in normal range    Pulse Readings from Last 1 Encounters:  12/13/22 82         Passed - Valid encounter within last 6 months    Recent Outpatient Visits           5 days ago Type 2 diabetes mellitus with diabetic polyneuropathy, without long-term current use of insulin (HCC)   [place] [facility] Delles, Jackelyn Poling, RPH-CPP   8 months ago Annual physical exam   [facility] Smitty Cords, DO   9 months ago Type 2 diabetes mellitus with diabetic polyneuropathy, without long-term current use of insulin (HCC)   [place] [facility] Delles, Gentry Fitz A, RPH-CPP   9 months ago Type 2 diabetes mellitus with diabetic polyneuropathy, without long-term current use of insulin (HCC)   [facility] Delles, Gentry Fitz A, RPH-CPP   1 year ago Type 2 diabetes mellitus with diabetic polyneuropathy, without long-term current use of insulin (HCC)    [facility] Delles, Jackelyn Poling, RPH-CPP       Future Appointments             In 6 months Deirdre Evener, MD [facility]

## 2023-03-24 ENCOUNTER — Telehealth: Payer: Self-pay | Admitting: Pharmacy Technician

## 2023-03-24 DIAGNOSIS — Z5986 Financial insecurity: Secondary | ICD-10-CM

## 2023-03-24 NOTE — Progress Notes (Signed)
[facility] Customer service manager [facility])                                            [facility] Quality Pharmacy Team    03/24/2023  Mikias Lanz [birth year] [id number]                                      Medication Assistance Referral  Referral From:  [facility] PharmD Estelle Grumbles  Medication/Company: Franki Monte / Novo Nordisk Patient application portion:  Mailed Provider application portion: Faxed  to Dr. Rozanna Box Provider address/fax verified via: Office website  Pattricia Boss, CPhT [place]  Office: [phone] Fax: [phone] Email: [email or web address]

## 2023-03-29 ENCOUNTER — Other Ambulatory Visit: Payer: Self-pay | Admitting: Family Medicine

## 2023-03-29 DIAGNOSIS — I480 Paroxysmal atrial fibrillation: Secondary | ICD-10-CM

## 2023-03-30 NOTE — Telephone Encounter (Signed)
Requested Prescriptions  Pending Prescriptions Disp Refills   ELIQUIS 5 MG TABS tablet [Pharmacy Med Name: ELIQUIS 5 MG TABLET] 60 tablet 0    Sig: Take 1 tablet (5 mg total) by mouth 2 (two) times daily.     Hematology:  Anticoagulants - apixaban Failed - 03/29/2023  9:38 AM      Failed - HGB in normal range and within 360 days    Hemoglobin  Date Value Ref Range Status  07/08/2022 17.4 (H) 13.2 - 17.1 g/dL Final  16/02/9603 54.0 13.0 - 17.7 g/dL Final         Failed - HCT in normal range and within 360 days    HCT  Date Value Ref Range Status  07/08/2022 50.8 (H) 38.5 - 50.0 % Final   Hematocrit  Date Value Ref Range Status  03/10/2021 44.6 37.5 - 51.0 % Final         Failed - Cr in normal range and within 360 days    Creat  Date Value Ref Range Status  07/08/2022 1.44 (H) 0.70 - 1.35 mg/dL Final   Creatinine, Urine  Date Value Ref Range Status  07/08/2022 176 20 - 320 mg/dL Final         Passed - PLT in normal range and within 360 days    Platelets  Date Value Ref Range Status  07/08/2022 312 140 - 400 Thousand/uL Final  03/10/2021 300 150 - 450 x10E3/uL Final         Passed - AST in normal range and within 360 days    AST  Date Value Ref Range Status  07/08/2022 29 10 - 35 U/L Final         Passed - ALT in normal range and within 360 days    ALT  Date Value Ref Range Status  07/08/2022 27 9 - 46 U/L Final         Passed - Valid encounter within last 12 months    Recent Outpatient Visits           1 week ago Type 2 diabetes mellitus with diabetic polyneuropathy, without long-term current use of insulin (HCC)   [place] [facility] [place] [facility] Delles, Jackelyn Poling, RPH-CPP   8 months ago Annual physical exam   [place] [facility] [place], Netta Neat, DO   9 months ago Type 2 diabetes mellitus with diabetic polyneuropathy, without long-term current use of insulin (HCC)   [place] [facility] Delles,  Gentry Fitz A, RPH-CPP   10 months ago Type 2 diabetes mellitus with diabetic polyneuropathy, without long-term current use of insulin (HCC)   [place] [facility] Delles, Gentry Fitz A, RPH-CPP   1 year ago Type 2 diabetes mellitus with diabetic polyneuropathy, without long-term current use of insulin (HCC)   [place] [facility] Delles, Jackelyn Poling, RPH-CPP       Future Appointments             In 5 months Deirdre Evener, MD [facility]

## 2023-04-07 ENCOUNTER — Other Ambulatory Visit: Payer: Self-pay | Admitting: Pharmacy Technician

## 2023-04-07 DIAGNOSIS — Z5986 Financial insecurity: Secondary | ICD-10-CM

## 2023-04-07 NOTE — Progress Notes (Signed)
Pharmacy Medication Assistance Program Note    04/07/2023  Patient ID: Cahill Feaser, male   DOB: 10-Jun-1955, 67 y.o.   MRN: [id number]     04/07/2023  Outreach Medication One  Manufacturer Medication One [employer] Drugs Ozempic  Dose of Ozempic 4mg /61ml  Type of Sport and exercise psychologist  Date Application Received From Patient 04/05/2023  Application Items Received From Patient Application;Proof of Income;Other  Date Application Received From Provider 04/06/2023  Date Application Submitted to Manufacturer 04/06/2023  Method Application Sent to Manufacturer Fax       Signature   Kristopher Glee [facility]  Office: [phone] Fax: [phone] Email: [email or web address]

## 2023-04-18 ENCOUNTER — Other Ambulatory Visit: Payer: Self-pay | Admitting: Pharmacist

## 2023-04-18 NOTE — Progress Notes (Signed)
   04/18/2023  [name], male   DOB: [birth year], 67 y.o.   MRN: [id number]  Receive a message from patient stating that he received a letter from [employer] stating that program needs more information to process his application including a copy of his insurance card  From review of information submitted to program by CPhT Noreene Larsson Simcox to [employer] on 04/07/2023, note a copy of patient's [facility] Medicare card was included.  Outreach to [employer] today on behalf of patient today. Per [employer], application now approved for patient. Enrolled through 05/16/2024.  - Provide an update to patient - Will provide an update to CPhT Jill Simcox  Estelle Grumbles, PharmD, [place] [place], CPP Clinical Pharmacist [facility] [phone]

## 2023-04-18 NOTE — Patient Instructions (Signed)
Goals Addressed             This Visit's Progress    Pharmacy Goals       If you need to reach out to patient assistance programs regarding refills or to find out the status of your application, you can do so by calling:  [employer] at [phone]  Our goal bad cholesterol, or LDL, is less than 70 . This is why it is important to continue taking your pravastatin  Please check your home blood pressure, keep a log of the results and bring this with you to your medical appointments.  Feel free to call me with any questions or concerns. I look forward to our next call!  Estelle Grumbles, PharmD, [facility] Clinical Pharmacist [facility] [phone]

## 2023-05-05 ENCOUNTER — Telehealth: Payer: Self-pay | Admitting: Pharmacy Technician

## 2023-05-05 DIAGNOSIS — Z5986 Financial insecurity: Secondary | ICD-10-CM

## 2023-05-05 NOTE — Progress Notes (Signed)
Pharmacy Medication Assistance Program Note    05/05/2023  [name], male   DOB: [birth year], 67 y.o.   MRN: [id number]     04/07/2023 05/05/2023  Outreach Medication One  Manufacturer Medication One [employer] Drugs Ozempic   Dose of Ozempic 4mg /81ml   Type of Radiographer, therapeutic Assistance   Date Application Received From Patient 04/05/2023   Application Items Received From Patient Application;Proof of Income;Other   Date Application Received From Provider 04/06/2023   Date Application Submitted to Manufacturer 04/06/2023   Method Application Sent to Manufacturer Fax   Patient Assistance Determination  Approved  Approval Start Date  05/18/2023  Approval End Date  05/16/2024  Patient Notification Method  Telephone Call  Telephone Call Outcome  Successful    Care coordination call placed to [employer] in regard to Ozempic application. Spoke to [place] who informs patient is APPROVED 05/18/23-05/16/24. Medication will auto fill and ship to prescriber's office. Patient may call [employer] at any time to check on next shipment by calling [phone]. Patient is aware of his approval.  Signature Kristopher Glee [place]  Office: [phone] Fax: [phone] Email: [email or web address]

## 2023-05-24 DIAGNOSIS — K080 Exfoliation of teeth due to systemic causes: Secondary | ICD-10-CM | POA: Diagnosis not present

## 2023-05-25 ENCOUNTER — Encounter: Payer: Self-pay | Admitting: Family Medicine

## 2023-05-25 ENCOUNTER — Ambulatory Visit (INDEPENDENT_AMBULATORY_CARE_PROVIDER_SITE_OTHER): Payer: Medicare Other | Admitting: Family Medicine

## 2023-05-25 VITALS — BP 138/88 | HR 72 | Ht 76.0 in | Wt 273.0 lb

## 2023-05-25 DIAGNOSIS — E1142 Type 2 diabetes mellitus with diabetic polyneuropathy: Secondary | ICD-10-CM

## 2023-05-25 DIAGNOSIS — R351 Nocturia: Secondary | ICD-10-CM | POA: Diagnosis not present

## 2023-05-25 DIAGNOSIS — E1169 Type 2 diabetes mellitus with other specified complication: Secondary | ICD-10-CM | POA: Diagnosis not present

## 2023-05-25 DIAGNOSIS — G4700 Insomnia, unspecified: Secondary | ICD-10-CM

## 2023-05-25 DIAGNOSIS — Z0000 Encounter for general adult medical examination without abnormal findings: Secondary | ICD-10-CM | POA: Diagnosis not present

## 2023-05-25 DIAGNOSIS — I10 Essential (primary) hypertension: Secondary | ICD-10-CM | POA: Diagnosis not present

## 2023-05-25 DIAGNOSIS — I480 Paroxysmal atrial fibrillation: Secondary | ICD-10-CM

## 2023-05-25 DIAGNOSIS — E785 Hyperlipidemia, unspecified: Secondary | ICD-10-CM

## 2023-05-25 MED ORDER — ZOLPIDEM TARTRATE 5 MG PO TABS: 5.0000 mg | ORAL_TABLET | Freq: Every evening | ORAL | 0 refills | Status: DC | PRN

## 2023-05-25 MED ORDER — METOPROLOL TARTRATE 25 MG PO TABS: 25.0000 mg | ORAL_TABLET | Freq: Two times a day (BID) | ORAL | 3 refills | Status: DC

## 2023-05-25 NOTE — Progress Notes (Signed)
 Subjective:    [name], male    DOB: 01-31-1956, 68 y.o.   MRN: [id number]  Richard Oconnell is a 68 y.o. male presenting on 05/25/2023 for Diabetes   HPI  Discussed the use of AI scribe software for clinical note transcription with the patient, who gave verbal consent to proceed.  History of Present Illness    Here for Annual Physical and Due for fasting labs.  The patient, with a history of diabetes, atrial fibrillation and bladder cancer, presents for a routine check-up and blood panel. He reports no significant changes in his health status since the last visit. The patient has been tolerating Ozempic  well for his diabetes management and has been receiving it through a program that covers the cost (patient assistance program). He expresses a desire to renew his prescription for Ambien , which he uses sparingly, few times per year only.  The patient also mentions a concern about the cost of Eliquis , a medication he takes for atrial fibrillation, and inquires about the possibility of reducing the dose. He expresses understanding of the importance of this medication in preventing serious cardiac events.  The patient also discusses his kidney function, which has shown slight fluctuations in the past. He expresses concern due to his history of bladder cancer. He also mentions a recent loss of a pet, which has caused some emotional distress.      Paroxysmal Atrial Fibrillation Followed by cardiology On anticoagulation, BB for rate   CHRONIC DM, Type 2 with neuropathy / Nephropathy HTN Previously Followed by Dr Douglas Nephro Currently doing well on Novo Nordisk PAP Meds: Ozempic  1mg  weekly injection - OFF Metformin  - Significant improved diet On ARB Denies any hypoglycemia, polyuria, visual changes, persistent numbness tingling   HYPERLIPIDEMIA: - Reports no concerns - Currently taking Pravastatin  20mg , tolerating well without side effects or myalgias    Insomnia Has flares with this on occasion, request re order Zolpidem  5mg  was successful.        05/25/2023   10:27 AM 10/21/2022    2:06 PM 07/05/2022   10:43 AM  Depression screen PHQ 2/9  Decreased Interest 0 0 0  Down, Depressed, Hopeless 0 0 0  PHQ - 2 Score 0 0 0  Altered sleeping  0 0  Tired, decreased energy  0 0  Change in appetite  0 0  Feeling bad or failure about yourself   0 0  Trouble concentrating  0 0  Moving slowly or fidgety/restless  0 0  Suicidal thoughts  0 0  PHQ-9 Score  0 0  Difficult doing work/chores  Not difficult at all Not difficult at all       05/25/2023   10:27 AM 07/05/2022   10:43 AM 01/13/2022   11:46 AM 05/29/2021    2:15 PM  GAD 7 : Generalized Anxiety Score  Nervous, Anxious, on Edge 0 0 0 1  Control/stop worrying 0 0 0 1  Worry too much - different things 0 0 0 0  Trouble relaxing 0 0 1 1  Restless 0 0 0 0  Easily annoyed or irritable 0 0 1 2  Afraid - awful might happen 0 0 0 0  Total GAD 7 Score 0 0 2 5  Anxiety Difficulty  Not difficult at all Not difficult at all Not difficult at all     Past Medical History:  Diagnosis Date   Cancer [facility])    Bladder Cancer   Erectile dysfunction associated with  type 2 diabetes mellitus (HCC) 05/26/2016   Gross hematuria 11/16/2016   Hypertension 06/11/2015   Type 2 diabetes mellitus with diabetic polyneuropathy, without long-term current use of insulin  (HCC) 06/11/2015   Past Surgical History:  Procedure Laterality Date   boil lanceted     hand   COLONOSCOPY WITH PROPOFOL  N/A 07/03/2020   Procedure: COLONOSCOPY WITH PROPOFOL ;  Surgeon: Therisa Bi, MD;  Location: [facility]COPY;  Service: Gastroenterology;  Laterality: N/A;   CYSTOSCOPY W/ RETROGRADES Bilateral 12/06/2016   Procedure: CYSTOSCOPY WITH RETROGRADE PYELOGRAM;  Surgeon: Penne Knee, MD;  Location: [facility] ORS;  Service: Urology;  Laterality: Bilateral;   CYSTOSCOPY W/ RETROGRADES Bilateral 06/27/2017   Procedure: CYSTOSCOPY WITH  RETROGRADE PYELOGRAM;  Surgeon: Penne Knee, MD;  Location: [facility] ORS;  Service: Urology;  Laterality: Bilateral;   CYSTOSCOPY WITH BIOPSY N/A 06/27/2017   Procedure: CYSTOSCOPY WITH Bladder BIOPSY;  Surgeon: Penne Knee, MD;  Location: [facility] ORS;  Service: Urology;  Laterality: N/A;   CYSTOSCOPY WITH BIOPSY N/A 10/24/2017   Procedure: CYSTOSCOPY WITH Bladder BIOPSY;  Surgeon: Penne Knee, MD;  Location: [facility] ORS;  Service: Urology;  Laterality: N/A;   ingrown  Bilateral    ingrown toenail   IR BILIARY DRAIN PLACEMENT WITH CHOLANGIOGRAM  07/10/2020   IR CHOLANGIOGRAM EXISTING TUBE  08/07/2020   IR RADIOLOGIST EVAL & MGMT  08/07/2020   IR RADIOLOGIST EVAL & MGMT  10/29/2020   IR RADIOLOGIST EVAL & MGMT  11/12/2020   TRANSURETHRAL RESECTION OF BLADDER TUMOR N/A 12/06/2016   Procedure: TRANSURETHRAL RESECTION OF BLADDER TUMOR (TURBT) (2-5cm) CLOT EVACUATION;  Surgeon: Penne Knee, MD;  Location: [facility] ORS;  Service: Urology;  Laterality: N/A;   TRANSURETHRAL RESECTION OF BLADDER TUMOR N/A 06/27/2017   Procedure: TRANSURETHRAL RESECTION OF BLADDER TUMOR (TURBT);  Surgeon: Penne Knee, MD;  Location: [facility] ORS;  Service: Urology;  Laterality: N/A;   TRANSURETHRAL RESECTION OF BLADDER TUMOR WITH MITOMYCIN -C N/A 01/18/2017   Procedure: TRANSURETHRAL RESECTION OF BLADDER TUMOR WITH MITOMYCIN -C-(SMALL);  Surgeon: Penne Knee, MD;  Location: [facility] ORS;  Service: Urology;  Laterality: N/A;   Social History   Socioeconomic History   Marital status: Divorced    Spouse name: Not on file   Number of children: Not on file   Years of education: Not on file   Highest education level: Not on file  Occupational History   Not on file  Tobacco Use   Smoking status: Former    Types: Cigars    Passive exposure: Past   Smokeless tobacco: Never   Tobacco comments:    once a while had one cigar >10 years ago  Vaping Use   Vaping status: Never Used  Substance and Sexual Activity   Alcohol use: No     Alcohol/week: 0.0 standard drinks of alcohol   Drug use: No   Sexual activity: Never  Other Topics Concern   Not on file  Social History Narrative   Lives alone   Social Drivers of Health   Financial Resource Strain: Low Risk  (10/21/2022)   Overall Financial Resource Strain (CARDIA)    Difficulty of Paying Living Expenses: Not hard at all  Food Insecurity: No Food Insecurity (10/21/2022)   Hunger Vital Sign    Worried About Running Out of Food in the Last Year: Never true    Ran Out of Food in the Last Year: Never true  Transportation Needs: No Transportation Needs (10/21/2022)   PRAPARE - Administrator, Civil Service (Medical): No  Lack of Transportation (Non-Medical): No  Physical Activity: Insufficiently Active (10/21/2022)   Exercise Vital Sign    Days of Exercise per Week: 2 days    Minutes of Exercise per Session: 20 min  Stress: No Stress Concern Present (10/21/2022)   [employer] of Occupational Health - Occupational Stress Questionnaire    Feeling of Stress : Only a little  Social Connections: Socially Isolated (10/21/2022)   Social Connection and Isolation Panel [NHANES]    Frequency of Communication with Friends and Family: More than three times a week    Frequency of Social Gatherings with Friends and Family: Three times a week    Attends Religious Services: Never    Active Member of Clubs or Organizations: No    Attends Banker Meetings: Never    Marital Status: Divorced  Catering Manager Violence: Not At Risk (10/21/2022)   Humiliation, Afraid, Rape, and Kick questionnaire    Fear of Current or Ex-Partner: No    Emotionally Abused: No    Physically Abused: No    Sexually Abused: No   Family History  Problem Relation Age of Onset   Heart attack Mother    Heart disease Mother    Parkinson's disease Father    Heart attack Maternal Grandfather    Heart disease Maternal Grandfather    Heart attack Maternal Aunt    Heart attack  Maternal Grandmother    Prostate cancer Neg Hx    Bladder Cancer Neg Hx    Kidney cancer Neg Hx    Current Outpatient Medications on File Prior to Visit  Medication Sig   apixaban  (ELIQUIS ) 5 MG TABS tablet Take 1 tablet (5 mg total) by mouth 2 (two) times daily.   pravastatin  (PRAVACHOL ) 20 MG tablet Take 1 tablet (20 mg total) by mouth daily.   Semaglutide , 1 MG/DOSE, 4 MG/3ML SOPN Inject 1 mg as directed once a week.   sildenafil  (REVATIO ) 20 MG tablet Take 1-5 pills about 30 min prior to sex. Start with 1 and increase as needed.   valsartan  (DIOVAN ) 320 MG tablet Take 320 mg by mouth daily.   No current facility-administered medications on file prior to visit.    Review of Systems  Constitutional:  Negative for activity change, appetite change, chills, diaphoresis, fatigue and fever.  HENT:  Negative for congestion and hearing loss.   Eyes:  Negative for visual disturbance.  Respiratory:  Negative for cough, chest tightness, shortness of breath and wheezing.   Cardiovascular:  Negative for chest pain, palpitations and leg swelling.  Gastrointestinal:  Negative for abdominal pain, constipation, diarrhea, nausea and vomiting.  Genitourinary:  Negative for dysuria, frequency and hematuria.  Musculoskeletal:  Negative for arthralgias and neck pain.  Skin:  Negative for rash.  Neurological:  Negative for dizziness, weakness, light-headedness, numbness and headaches.  Hematological:  Negative for adenopathy.  Psychiatric/Behavioral:  Negative for behavioral problems, dysphoric mood and sleep disturbance.    Per HPI unless specifically indicated above     Objective:    BP (!) 152/90   Pulse 72   Ht 6' 4 (1.93 m)   Wt 273 lb (123.8 kg)   SpO2 98%   BMI 33.23 kg/m   Wt Readings from Last 3 Encounters:  05/25/23 273 lb (123.8 kg)  12/13/22 275 lb 9.6 oz (125 kg)  10/21/22 274 lb (124.3 kg)    Physical Exam Vitals and nursing note reviewed.  Constitutional:      General:  He is not in acute  distress.    Appearance: He is well-developed. He is obese. He is not diaphoretic.     Comments: Well-appearing, comfortable, cooperative  HENT:     Head: Normocephalic and atraumatic.  Eyes:     General:        Right eye: No discharge.        Left eye: No discharge.     Conjunctiva/sclera: Conjunctivae normal.     Pupils: Pupils are equal, round, and reactive to light.  Neck:     Thyroid : No thyromegaly.  Cardiovascular:     Rate and Rhythm: Normal rate and regular rhythm.     Pulses: Normal pulses.     Heart sounds: Normal heart sounds. No murmur heard. Pulmonary:     Effort: Pulmonary effort is normal. No respiratory distress.     Breath sounds: Normal breath sounds. No wheezing or rales.  Abdominal:     General: Bowel sounds are normal. There is no distension.     Palpations: Abdomen is soft. There is no mass.     Tenderness: There is no abdominal tenderness.  Musculoskeletal:        General: No tenderness. Normal range of motion.     Cervical back: Normal range of motion and neck supple.     Right lower leg: No edema.     Left lower leg: No edema.     Comments: Upper / Lower Extremities: - Normal muscle tone, strength bilateral upper extremities 5/5, lower extremities 5/5  Lymphadenopathy:     Cervical: No cervical adenopathy.  Skin:    General: Skin is warm and dry.     Findings: No erythema or rash.  Neurological:     Mental Status: He is alert and oriented to person, place, and time.     Comments: Distal sensation intact to light touch all extremities  Psychiatric:        Mood and Affect: Mood normal.        Behavior: Behavior normal.        Thought Content: Thought content normal.     Comments: Well groomed, good eye contact, normal speech and thoughts     Results for orders placed or performed in visit on 07/08/22  Urine Microalbumin w/creat. ratio   Collection Time: 07/08/22 10:01 AM  Result Value Ref Range   Creatinine, Urine 176 20 -  320 mg/dL   Microalb, Ur 1.9 mg/dL   Microalb Creat Ratio 11 <30 mcg/mg creat  TSH   Collection Time: 07/08/22 10:01 AM  Result Value Ref Range   TSH 0.68 0.40 - 4.50 mIU/L  PSA   Collection Time: 07/08/22 10:01 AM  Result Value Ref Range   PSA 0.46 < OR = 4.00 ng/mL  Hemoglobin A1c   Collection Time: 07/08/22 10:01 AM  Result Value Ref Range   Hgb A1c MFr Bld 8.0 (H) <5.7 % of total Hgb   Mean Plasma Glucose 183 mg/dL   eAG (mmol/L) 89.8 mmol/L  Lipid panel   Collection Time: 07/08/22 10:01 AM  Result Value Ref Range   Cholesterol 157 <200 mg/dL   HDL 39 (L) > OR = 40 mg/dL   Triglycerides 661 (H) <150 mg/dL   LDL Cholesterol (Calc) 77 mg/dL (calc)   Total CHOL/HDL Ratio 4.0 <5.0 (calc)   Non-HDL Cholesterol (Calc) 118 <130 mg/dL (calc)  COMPLETE METABOLIC PANEL WITH GFR   Collection Time: 07/08/22 10:01 AM  Result Value Ref Range   Glucose, Bld 160 (H) 65 - 99 mg/dL  BUN 24 7 - 25 mg/dL   Creat 8.55 (H) 9.29 - 1.35 mg/dL   eGFR 53 (L) > OR = 60 mL/min/1.22m2   BUN/Creatinine Ratio 17 6 - 22 (calc)   Sodium 140 135 - 146 mmol/L   Potassium 5.0 3.5 - 5.3 mmol/L   Chloride 105 98 - 110 mmol/L   CO2 24 20 - 32 mmol/L   Calcium 10.1 8.6 - 10.3 mg/dL   Total Protein 8.1 6.1 - 8.1 g/dL   Albumin  4.5 3.6 - 5.1 g/dL   Globulin 3.6 1.9 - 3.7 g/dL (calc)   AG Ratio 1.3 1.0 - 2.5 (calc)   Total Bilirubin 0.4 0.2 - 1.2 mg/dL   Alkaline phosphatase (APISO) 89 35 - 144 U/L   AST 29 10 - 35 U/L   ALT 27 9 - 46 U/L  CBC with Differential/Platelet   Collection Time: 07/08/22 10:01 AM  Result Value Ref Range   WBC 7.7 3.8 - 10.8 Thousand/uL   RBC 5.68 4.20 - 5.80 Million/uL   Hemoglobin 17.4 (H) 13.2 - 17.1 g/dL   HCT 49.1 (H) 61.4 - 49.9 %   MCV 89.4 80.0 - 100.0 fL   MCH 30.6 27.0 - 33.0 pg   MCHC 34.3 32.0 - 36.0 g/dL   RDW 87.5 88.9 - 84.9 %   Platelets 312 140 - 400 Thousand/uL   MPV 10.7 7.5 - 12.5 fL   Neutro Abs 4,389 1,500 - 7,800 cells/uL   Lymphs Abs 2,441 850  - 3,900 cells/uL   Absolute Monocytes 539 200 - 950 cells/uL   Eosinophils Absolute 262 15 - 500 cells/uL   Basophils Absolute 69 0 - 200 cells/uL   Neutrophils Relative % 57 %   Total Lymphocyte 31.7 %   Monocytes Relative 7.0 %   Eosinophils Relative 3.4 %   Basophils Relative 0.9 %      Assessment & Plan:   Problem List Items Addressed This Visit     Atrial fibrillation (HCC)   Relevant Medications   metoprolol  tartrate (LOPRESSOR ) 25 MG tablet   Other Relevant Orders   CBC with Differential/Platelet   COMPLETE METABOLIC PANEL WITH GFR   Hyperlipidemia associated with type 2 diabetes mellitus (HCC)   Relevant Medications   metoprolol  tartrate (LOPRESSOR ) 25 MG tablet   Other Relevant Orders   Lipid panel   COMPLETE METABOLIC PANEL WITH GFR   Insomnia   Relevant Medications   zolpidem  (AMBIEN ) 5 MG tablet   Type 2 diabetes mellitus with diabetic polyneuropathy, without long-term current use of insulin  (HCC)   Relevant Medications   zolpidem  (AMBIEN ) 5 MG tablet   Other Relevant Orders   Hemoglobin A1c   Other Visit Diagnoses       Annual physical exam    -  Primary   Relevant Orders   Lipid panel   Hemoglobin A1c   CBC with Differential/Platelet   COMPLETE METABOLIC PANEL WITH GFR   PSA   TSH     Essential hypertension       Relevant Medications   metoprolol  tartrate (LOPRESSOR ) 25 MG tablet   Other Relevant Orders   CBC with Differential/Platelet   COMPLETE METABOLIC PANEL WITH GFR        Updated Health Maintenance information Reviewed recent lab results with patient Encouraged improvement to lifestyle with diet and exercise Goal of weight loss   Atrial Fibrillation On Eliquis  for stroke prevention. Discussed the necessity of continued anticoagulation given the presence of atrial fibrillation. -Continue Eliquis   at the current dose. 5mg  TWICE A DAY based on weight, age and kidney function  Insomnia Patient requested a refill of Ambien , which has  been used sparingly in the past. -Renew prescription for Ambien  5mg  PRN  Type 2 Diabetes Pending A1c Patient reports tolerating Ozempic  well and is enrolled in a program for medication delivery. -Continue Ozempic  1mg   Due for DM Eye and Foot exam at next visit.  General Health Maintenance -Order comprehensive blood panel today. -Check kidney function given slight elevation in creatinine in the past. -Plan for follow-up based on blood test results, likely in 3-6 months.         Orders Placed This Encounter  Procedures   Lipid panel    Has the patient fasted?:   Yes   Hemoglobin A1c   CBC with Differential/Platelet   COMPLETE METABOLIC PANEL WITH GFR   PSA   TSH    Meds ordered this encounter  Medications   zolpidem  (AMBIEN ) 5 MG tablet    Sig: Take 1 tablet (5 mg total) by mouth at bedtime as needed for sleep.    Dispense:  20 tablet    Refill:  0   metoprolol  tartrate (LOPRESSOR ) 25 MG tablet    Sig: Take 1 tablet (25 mg total) by mouth 2 (two) times daily.    Dispense:  180 tablet    Refill:  3     Follow up plan: Return in about 6 months (around 11/22/2023) for 6 month DM A1c.  Marsa Officer, DO [facility] 05/25/2023, 10:29 AM

## 2023-05-25 NOTE — Patient Instructions (Addendum)
 Thank you for coming to the office today.  Labs today  Stay tuned for lab results and follow up plan.  Refilled Zolpidem  and Metoprolol   Please schedule a Follow-up Appointment to: Return in about 6 months (around 11/22/2023) for 6 month DM A1c.  If you have any other questions or concerns, please feel free to call the office or send a message through MyChart. You may also schedule an earlier appointment if necessary.  Additionally, you may be receiving a survey about your experience at our office within a few days to 1 week by e-mail or mail. We value your feedback.  Marsa Officer, DO [facility] [place], [place]

## 2023-05-26 LAB — LIPID PANEL
Cholesterol: 163 mg/dL (ref <200)
HDL: 35 mg/dL — ABNORMAL LOW (ref >=40)
LDL Cholesterol (Calc): 89 mg/dL
Non-HDL Cholesterol (Calc): 128 mg/dL (ref <130)
Total CHOL/HDL Ratio: 4.7 (calc) (ref <5.0)
Triglycerides: 326 mg/dL — ABNORMAL HIGH (ref <150)

## 2023-05-26 LAB — CBC WITH DIFFERENTIAL/PLATELET
Absolute Lymphocytes: 3108 {cells}/uL (ref 850–3900)
Absolute Monocytes: 533 {cells}/uL (ref 200–950)
Basophils Absolute: 57 {cells}/uL (ref 0–200)
Basophils Relative: 0.7 %
Eosinophils Absolute: 164 {cells}/uL (ref 15–500)
Eosinophils Relative: 2 %
HCT: 49.8 % (ref 38.5–50.0)
Hemoglobin: 16.9 g/dL (ref 13.2–17.1)
MCH: 31 pg (ref 27.0–33.0)
MCHC: 33.9 g/dL (ref 32.0–36.0)
MCV: 91.2 fL (ref 80.0–100.0)
MPV: 10.8 fL (ref 7.5–12.5)
Monocytes Relative: 6.5 %
Neutro Abs: 4338 {cells}/uL (ref 1500–7800)
Neutrophils Relative %: 52.9 %
Platelets: 293 10*3/uL (ref 140–400)
RBC: 5.46 10*6/uL (ref 4.20–5.80)
RDW: 12.4 % (ref 11.0–15.0)
Total Lymphocyte: 37.9 %
WBC: 8.2 10*3/uL (ref 3.8–10.8)

## 2023-05-26 LAB — COMPLETE METABOLIC PANEL WITH GFR
AG Ratio: 1.4 (calc) (ref 1.0–2.5)
ALT: 29 U/L (ref 9–46)
AST: 26 U/L (ref 10–35)
Albumin: 4.6 g/dL (ref 3.6–5.1)
Alkaline phosphatase (APISO): 75 U/L (ref 35–144)
BUN/Creatinine Ratio: 16 (calc) (ref 6–22)
BUN: 24 mg/dL (ref 7–25)
CO2: 28 mmol/L (ref 20–32)
Calcium: 10.4 mg/dL — ABNORMAL HIGH (ref 8.6–10.3)
Chloride: 100 mmol/L (ref 98–110)
Creat: 1.52 mg/dL — ABNORMAL HIGH (ref 0.70–1.35)
Globulin: 3.3 g/dL (ref 1.9–3.7)
Glucose, Bld: 124 mg/dL — ABNORMAL HIGH (ref 65–99)
Potassium: 4.6 mmol/L (ref 3.5–5.3)
Sodium: 138 mmol/L (ref 135–146)
Total Bilirubin: 0.7 mg/dL (ref 0.2–1.2)
Total Protein: 7.9 g/dL (ref 6.1–8.1)
eGFR: 50 mL/min/{1.73_m2} — ABNORMAL LOW (ref >=60)

## 2023-05-26 LAB — HEMOGLOBIN A1C
Hgb A1c MFr Bld: 8 %{Hb} — ABNORMAL HIGH (ref <5.7)
Mean Plasma Glucose: 183 mg/dL
eAG (mmol/L): 10.1 mmol/L

## 2023-05-26 LAB — PSA: PSA: 0.52 ng/mL (ref <=4.00)

## 2023-05-26 LAB — TSH: TSH: 0.46 m[IU]/L (ref 0.40–4.50)

## 2023-05-27 ENCOUNTER — Encounter: Payer: Self-pay | Admitting: Pharmacist

## 2023-05-27 ENCOUNTER — Other Ambulatory Visit: Payer: Medicare Other | Admitting: Pharmacist

## 2023-05-27 DIAGNOSIS — I10 Essential (primary) hypertension: Secondary | ICD-10-CM

## 2023-05-27 DIAGNOSIS — E1142 Type 2 diabetes mellitus with diabetic polyneuropathy: Secondary | ICD-10-CM

## 2023-05-27 NOTE — Progress Notes (Signed)
 05/27/2023 Name: Richard Oconnell MRN: [id number] DOB: 1955-08-31  Chief Complaint  Patient presents with   Medication Management   Medication Assistance    Richard Oconnell is a 68 y.o. year old male who presented for a telephone visit.   They were referred to the pharmacist by their PCP for assistance in managing medication access.      Subjective:   Care Team: Primary Care Provider: Edman Marsa PARAS, DO Cardiologist: Richard Oconnell  Medication Access/Adherence  Current Pharmacy:  [facility] - [place], [place] - [address] [place] [address] Phone: [phone] Fax: [phone]  [facility] - [place], [place] - [address] [place] [address] Phone: [phone] Fax: [phone]  [id number] [place] Richard Oconnell, Richard Oconnell - [address] AT [facility] OF [address] [place] [address] Phone: [phone] Fax: [phone]   Patient reports affordability concerns with their medications: No  Patient reports access/transportation concerns to their pharmacy: No  Patient reports adherence concerns with their medications:  No     Denies missed doses   Diabetes:   Current medications: Ozempic  1 mg weekly on Thursdays             Reports tolerating well   Recalls last checked home fasting blood sugar and recalls reading ~143   Denies symptoms of hypoglycemia   Attributes latest elevated A1C result to recent eating habits (eating more processed foods/more carbohydrates) and limited physical activity   Exercise: reports recently has had limited activity as has been busy, but looking to get back gradually to jogging    Statin: pravastatin  20 mg daily   Current medication access support: Enrolled in patient assistance for Ozempic  from [employer] patient assistance program through 05/16/2024    Atrial Fibrillation/Hypertension: Current medications: Eliquis  5 mg twice  daily Metoprolol  25 mg twice daily valsartan  320mg  daily   Patient has an upper arm home BP cuff Recalls last checked home blood pressure before office visit on 05/25/2023, reading 127/83    Denies symptoms of hypotension including dizziness/lightheadedness   Exercise: reports staying active recently with yard work   Objective:  Lab Results  Component Value Date   HGBA1C 8.0 (H) 05/25/2023    Lab Results  Component Value Date   CREATININE 1.52 (H) 05/25/2023   BUN 24 05/25/2023   NA 138 05/25/2023   K 4.6 05/25/2023   CL 100 05/25/2023   CO2 28 05/25/2023    Lab Results  Component Value Date   CHOL 163 05/25/2023   HDL 35 (L) 05/25/2023   LDLCALC 89 05/25/2023   TRIG 326 (H) 05/25/2023   CHOLHDL 4.7 05/25/2023   BP Readings from Last 3 Encounters:  05/25/23 138/88  12/13/22 (!) 128/90  07/05/22 108/70   Pulse Readings from Last 3 Encounters:  05/25/23 72  12/13/22 82  07/05/22 84     Medications Reviewed Today     Reviewed by Alana Sharyle LABOR, RPH-CPP (Pharmacist) on 05/27/23 at 1349  Med List Status: <None>   Medication Order Taking? Sig Documenting Provider Last Dose Status Informant  apixaban  (ELIQUIS ) 5 MG TABS tablet [id number] Yes Take 1 tablet (5 mg total) by mouth 2 (two) times daily. Richard Marsa PARAS, DO Taking Active   metoprolol  tartrate (LOPRESSOR ) 25 MG tablet [id number] Yes Take 1 tablet (25 mg total) by mouth 2 (two) times daily. Karamalegos,  Marsa PARAS, DO Taking Active   pravastatin  (PRAVACHOL ) 20 MG tablet [id number]  Take 1 tablet (20 mg total) by mouth daily. Furth, Cadence H, PA-C  Active   Semaglutide , 1 MG/DOSE, 4 MG/3ML SOPN [id number] Yes Inject 1 mg as directed once a week. Richard Marsa PARAS, DO Taking Active   sildenafil  (REVATIO ) 20 MG tablet [id number]  Take 1-5 pills about 30 min prior to sex. Start with 1 and increase as needed. Richard Marsa PARAS, DO  Active   valsartan  (DIOVAN ) 320 MG tablet [id number] Yes  Take 320 mg by mouth daily. [provider] Taking Active   zolpidem  (AMBIEN ) 5 MG tablet [id number]  Take 1 tablet (5 mg total) by mouth at bedtime as needed for sleep. Richard Marsa PARAS, DO  Active               Assessment/Plan:   Diabetes: - Reviewed long term cardiovascular and renal outcomes of uncontrolled blood sugar - Reviewed goal A1c, goal fasting, and goal 2 hour post prandial glucose - Reviewed dietary modifications including encourage patient to have regular well-balanced meals throughout the day, while limiting carbohydrate portion sizes   Encourage patient to review nutrition labels for total carbohydrate content of foods - Will send patient handout on healthy meal planning via MyChart as requested - Recommended to monitor home blood sugar, keep log of results, bring this record to medical appointment and contact office if readings outside of established parameters - Discuss medication management options. Patient prefers to hold off on making adjustment to his medications at this time. Rather prefers to work on lifestyle changes (improving eating habits and exercise)   Hypertension/Atrial Fibrillation: - Recommended to monitor home BP and HR, keep log of results, bring this record to medical appointment and contact office if readings outside of established parameters     Follow Up Plan: Clinical Pharmacist to outreach to patient by telephone on 07/29/2023 at 10:30 AM    Sharyle Sia, PharmD, JAQUELINE, CPP Clinical Pharmacist [facility] [phone]

## 2023-05-27 NOTE — Patient Instructions (Signed)
 Goals Addressed             This Visit's Progress    Pharmacy Goals       If you need to reach out to patient assistance programs regarding refills or to find out the status of your application, you can do so by calling:  [employer] at [phone]  Our goal bad cholesterol, or LDL, is less than 70 . This is why it is important to continue taking your pravastatin  Please check your home blood pressure, keep a log of the results and bring this with you to your medical appointments.  Feel free to call me with any questions or concerns. I look forward to our next call!  Estelle Grumbles, PharmD, [facility] Clinical Pharmacist [facility] [phone]

## 2023-06-02 ENCOUNTER — Encounter: Payer: Self-pay | Admitting: Family Medicine

## 2023-06-03 ENCOUNTER — Other Ambulatory Visit: Payer: Medicare Other

## 2023-06-13 DIAGNOSIS — K080 Exfoliation of teeth due to systemic causes: Secondary | ICD-10-CM | POA: Diagnosis not present

## 2023-06-14 DIAGNOSIS — K080 Exfoliation of teeth due to systemic causes: Secondary | ICD-10-CM | POA: Diagnosis not present

## 2023-06-27 ENCOUNTER — Other Ambulatory Visit: Payer: Self-pay | Admitting: Family Medicine

## 2023-06-27 ENCOUNTER — Telehealth: Payer: Self-pay | Admitting: Pharmacist

## 2023-06-27 ENCOUNTER — Other Ambulatory Visit: Payer: Self-pay

## 2023-06-27 DIAGNOSIS — I480 Paroxysmal atrial fibrillation: Secondary | ICD-10-CM

## 2023-06-27 DIAGNOSIS — G4700 Insomnia, unspecified: Secondary | ICD-10-CM

## 2023-06-27 DIAGNOSIS — I10 Essential (primary) hypertension: Secondary | ICD-10-CM

## 2023-06-27 DIAGNOSIS — E1169 Type 2 diabetes mellitus with other specified complication: Secondary | ICD-10-CM

## 2023-06-27 MED ORDER — APIXABAN 5 MG PO TABS
5.0000 mg | ORAL_TABLET | Freq: Two times a day (BID) | ORAL | 1 refills | Status: DC
  Filled 2023-09-02: qty 180, 90d supply, fill #0

## 2023-06-27 MED ORDER — ZOLPIDEM TARTRATE 5 MG PO TABS: 5.0000 mg | ORAL_TABLET | Freq: Every evening | ORAL | 0 refills | Status: DC | PRN

## 2023-06-27 MED ORDER — PRAVASTATIN SODIUM 20 MG PO TABS: 20.0000 mg | ORAL_TABLET | Freq: Every day | ORAL | 3 refills | Status: DC

## 2023-06-27 MED ORDER — METOPROLOL TARTRATE 25 MG PO TABS: 25.0000 mg | ORAL_TABLET | Freq: Two times a day (BID) | ORAL | 3 refills | Status: DC

## 2023-06-27 MED ORDER — VALSARTAN 320 MG PO TABS: 320.0000 mg | ORAL_TABLET | Freq: Every day | ORAL | 3 refills | Status: DC

## 2023-06-27 NOTE — Progress Notes (Signed)
 Patient requesting renewals for Eliquis , metoprolol , pravastatin , valsartan  and zolpidem .  Would you please send prescriptions to [facility]?  Thank you!

## 2023-06-27 NOTE — Telephone Encounter (Signed)
Ambien refilled.  Thanks.

## 2023-07-11 DIAGNOSIS — K080 Exfoliation of teeth due to systemic causes: Secondary | ICD-10-CM | POA: Diagnosis not present

## 2023-07-29 ENCOUNTER — Other Ambulatory Visit: Payer: Medicare Other | Admitting: Pharmacist

## 2023-07-29 DIAGNOSIS — E1142 Type 2 diabetes mellitus with diabetic polyneuropathy: Secondary | ICD-10-CM

## 2023-07-29 DIAGNOSIS — Z7985 Long-term (current) use of injectable non-insulin antidiabetic drugs: Secondary | ICD-10-CM

## 2023-07-29 NOTE — Progress Notes (Signed)
 07/29/2023 Name: Ezeriah Luty MRN: [id number] DOB: [birth year]  Chief Complaint  Patient presents with   Medication Assistance    Deshan Hemmelgarn is a 68 y.o. year old male who presented for a telephone visit.   They were referred to the pharmacist by their PCP for assistance in managing medication access.      Subjective:   Care Team: Primary Care Provider: Smitty Cords, DO Cardiologist: [facility]  Medication Access/Adherence  Current Pharmacy:  [facility] - [place], [place] - [address] [place] [address] Phone: [phone] Fax: [phone]  [facility] - [place], [place] - [address] [place] [address] Phone: [phone] Fax: [phone]  [facility][id number] [place], [place] - [address] AT [facility] OF [address] [place] [address] Phone: [phone] Fax: [phone]   Patient reports affordability concerns with their medications: No  Patient reports access/transportation concerns to their pharmacy: No  Patient reports adherence concerns with their medications:  No     Denies using weekly pillbox. Reports has his own system; denies missed doses   Diabetes:   Current medications: Ozempic 1 mg weekly on Thursdays             Reports tolerating well   Recalls recent morning fasting blood sugar readings ranging 109-121   Denies symptoms of hypoglycemia      Exercise: reports recently has had limited activity as has been busy, but looking to get back gradually to jogging    Statin: pravastatin 20 mg daily   Current medication access support: Enrolled in patient assistance for Ozempic from [employer] patient assistance program through 05/16/2024 - Reports currently has 1 month supply remaining    Atrial Fibrillation/Hypertension: Current medications: Eliquis 5 mg twice daily Metoprolol 25 mg twice daily valsartan 320mg  daily    Patient has an upper arm home BP cuff Recalls last checked home blood pressure on 3/12, reading: 123/82, HR 68   Denies symptoms of hypotension including dizziness/lightheadedness   Exercise: reports staying active recently with yard work   Objective:  Lab Results  Component Value Date   HGBA1C 8.0 (H) 05/25/2023    Lab Results  Component Value Date   CREATININE 1.52 (H) 05/25/2023   BUN 24 05/25/2023   NA 138 05/25/2023   K 4.6 05/25/2023   CL 100 05/25/2023   CO2 28 05/25/2023    Lab Results  Component Value Date   CHOL 163 05/25/2023   HDL 35 (L) 05/25/2023   LDLCALC 89 05/25/2023   TRIG 326 (H) 05/25/2023   CHOLHDL 4.7 05/25/2023   BP Readings from Last 3 Encounters:  05/25/23 138/88  12/13/22 (!) 128/90  07/05/22 108/70   Pulse Readings from Last 3 Encounters:  05/25/23 72  12/13/22 82  07/05/22 84     Medications Reviewed Today     Reviewed by Manuela Neptune, RPH-CPP (Pharmacist) on 07/29/23 at 1044  Med List Status: <None>   Medication Order Taking? Sig Documenting Provider Last Dose Status Informant  apixaban (ELIQUIS) 5 MG TABS tablet [id number] Yes Take 1 tablet (5 mg total) by mouth 2 (two) times daily. Smitty Cords, DO Taking Active   metoprolol tartrate (LOPRESSOR) 25 MG tablet [id number] Yes Take 1 tablet (25 mg total) by mouth 2 (two) times daily. Smitty Cords, DO Taking Active   pravastatin (  PRAVACHOL) 20 MG tablet [id number] Yes Take 1 tablet (20 mg total) by mouth daily. Smitty Cords, DO Taking Active   Semaglutide, 1 MG/DOSE, 4 MG/3ML SOPN [id number] Yes Inject 1 mg as directed once a week. Smitty Cords, DO Taking Active   sildenafil (REVATIO) 20 MG tablet [id number]  Take 1-5 pills about 30 min prior to sex. Start with 1 and increase as needed. Karamalegos, Netta Neat, DO  Active   valsartan (DIOVAN) 320 MG tablet [id number] Yes Take 1 tablet (320 mg total) by mouth daily. Smitty Cords, DO Taking Active   zolpidem (AMBIEN) 5 MG tablet [id number]  Take 1 tablet (5 mg total) by mouth at bedtime as needed for sleep. Smitty Cords, DO  Active               Assessment/Plan:   Diabetes: - Reviewed long term cardiovascular and renal outcomes of uncontrolled blood sugar - Reviewed goal A1c, goal fasting, and goal 2 hour post prandial glucose - Reviewed dietary modifications including encourage patient to have regular well-balanced meals throughout the day, while limiting carbohydrate portion sizes              Encourage patient to review nutrition labels for total carbohydrate content of foods - Recommended to monitor home blood sugar, keep log of results, bring this record to medical appointment and contact office if readings outside of established parameters - Patient to contact [employer] as needed for refills of Ozempic   Hypertension/Atrial Fibrillation: - Recommended to monitor home BP and HR, keep log of results, bring this record to medical appointment and contact office if readings outside of established parameters     Follow Up Plan: Clinical Pharmacist to outreach to patient by telephone on 08/26/2023 at 10:30 AM     Estelle Grumbles, PharmD, P[place]y [place], CPP Clinical Pharmacist [facility] [phone]

## 2023-08-01 NOTE — Patient Instructions (Signed)
 Goals Addressed             This Visit's Progress    Pharmacy Goals       If you need to reach out to patient assistance programs regarding refills or to find out the status of your application, you can do so by calling:  [employer] at [phone]  Our goal bad cholesterol, or LDL, is less than 70 . This is why it is important to continue taking your pravastatin  Please check your home blood pressure, keep a log of the results and bring this with you to your medical appointments.  Feel free to call me with any questions or concerns. I look forward to our next call!  Estelle Grumbles, PharmD, [facility] Clinical Pharmacist [facility] [phone]

## 2023-08-10 ENCOUNTER — Other Ambulatory Visit (INDEPENDENT_AMBULATORY_CARE_PROVIDER_SITE_OTHER): Payer: Self-pay | Admitting: Pharmacist

## 2023-08-10 DIAGNOSIS — E1142 Type 2 diabetes mellitus with diabetic polyneuropathy: Secondary | ICD-10-CM

## 2023-08-10 DIAGNOSIS — Z7985 Long-term (current) use of injectable non-insulin antidiabetic drugs: Secondary | ICD-10-CM

## 2023-08-10 MED ORDER — SEMAGLUTIDE (1 MG/DOSE) 4 MG/3ML ~~LOC~~ SOPN: 1.0000 mg | PEN_INJECTOR | SUBCUTANEOUS | 0 refills | Status: AC

## 2023-08-10 NOTE — Progress Notes (Signed)
   08/10/2023  [name], male   DOB: 03-08-1956, 68 y.o.   MRN: [id number]  Receive a call from patient requesting a call back.  Return call to patient. Reports just used up last dose of Ozempic. Reports received voucher for 62-month free supply from [employer], to use while waiting on supply to ship from program.   Will send 102-month supply prescription for Ozempic 1 mg weekly to [employer] [facility] as requested for patient to use with voucher.  Patient to continue to follow up with [employer] patient assistance program as needed for refills of Ozempic.  Estelle Grumbles, PharmD, [facility] [phone]

## 2023-08-10 NOTE — Patient Instructions (Signed)
 Goals Addressed             This Visit's Progress    Pharmacy Goals       If you need to reach out to patient assistance programs regarding refills or to find out the status of your application, you can do so by calling:  [employer] at [phone]  Our goal bad cholesterol, or LDL, is less than 70 . This is why it is important to continue taking your pravastatin  Please check your home blood pressure, keep a log of the results and bring this with you to your medical appointments.  Feel free to call me with any questions or concerns. I look forward to our next call!  Estelle Grumbles, PharmD, [facility] Clinical Pharmacist [facility] [phone]

## 2023-08-26 ENCOUNTER — Other Ambulatory Visit

## 2023-08-31 ENCOUNTER — Other Ambulatory Visit: Payer: Self-pay | Admitting: Family Medicine

## 2023-08-31 DIAGNOSIS — G4700 Insomnia, unspecified: Secondary | ICD-10-CM

## 2023-09-01 NOTE — Telephone Encounter (Signed)
 Requested medications are due for refill today.  yes  Requested medications are on the active medications list.  yes  Last refill. 06/27/2023 #20 0 rf  Future visit scheduled.   yes  Notes to clinic.  Refill not delegated.    Requested Prescriptions  Pending Prescriptions Disp Refills   zolpidem (AMBIEN) 5 MG tablet [Pharmacy Med Name: ZOLPIDEM TARTRATE 5 MG TABLET] 20 tablet 0    Sig: Take 1 tablet (5 mg total) by mouth at bedtime as needed for sleep.     Not Delegated - Psychiatry:  Anxiolytics/Hypnotics Failed - 09/01/2023 11:33 AM      Failed - This refill cannot be delegated      Failed - Urine Drug Screen completed in last 360 days      Failed - Valid encounter within last 6 months    Recent Outpatient Visits   None

## 2023-09-02 ENCOUNTER — Other Ambulatory Visit: Payer: Self-pay | Admitting: Pharmacist

## 2023-09-02 ENCOUNTER — Other Ambulatory Visit (HOSPITAL_COMMUNITY): Payer: Self-pay

## 2023-09-02 ENCOUNTER — Other Ambulatory Visit: Payer: Self-pay

## 2023-09-02 NOTE — Progress Notes (Signed)
   09/02/2023  [name], male   DOB: 03-25-1956, 68 y.o.   MRN: [id number]  Received a message from patient requesting a call back  Return call to patient.  Reports that he is unable to pick up his Eliquis  prescription from [facility] as they are unable to get this medication in stock from their wholesaler and he only has enough medication to last until Monday.  Counsel patient on process of contacting an alternative pharmacy to have Eliquis  prescription transferred and filled. Patient plans to contact [facility].  Patient advised to call Clinical Pharmacist back if having any further difficulty with obtaining his Eliquis  prescription.  Sharyle Sia, PharmD, [facility] [phone]

## 2023-09-07 DIAGNOSIS — K080 Exfoliation of teeth due to systemic causes: Secondary | ICD-10-CM | POA: Diagnosis not present

## 2023-09-12 ENCOUNTER — Other Ambulatory Visit: Admitting: Pharmacist

## 2023-09-12 DIAGNOSIS — I10 Essential (primary) hypertension: Secondary | ICD-10-CM

## 2023-09-12 DIAGNOSIS — E1142 Type 2 diabetes mellitus with diabetic polyneuropathy: Secondary | ICD-10-CM

## 2023-09-12 DIAGNOSIS — E1169 Type 2 diabetes mellitus with other specified complication: Secondary | ICD-10-CM

## 2023-09-12 DIAGNOSIS — Z7985 Long-term (current) use of injectable non-insulin antidiabetic drugs: Secondary | ICD-10-CM

## 2023-09-12 NOTE — Progress Notes (Signed)
 09/12/2023 Name: Richard Oconnell MRN: [id number] DOB: Sep 20, 1955  Chief Complaint  Patient presents with   Medication Management   Medication Assistance    Richard Oconnell is a 68 y.o. year old male who presented for a telephone visit.   They were referred to the pharmacist by their PCP for assistance in managing medication access.      Subjective:   Care Team: Primary Care Provider: Raina Bunting, DO Cardiologist: [facility]  Medication Access/Adherence  Current Pharmacy:  [facility] - [place], [place] - [address] [place] [address] Phone: [phone] Fax: [phone]  [facility] - [place], [place] - [address] [place] [address] Phone: [phone] Fax: [phone]  [facility] #[id number] [place] Gallop, [place] - [address] AT [facility] OF [address] [place] [address] [place] [address] Phone: [phone] Fax: [phone]   Patient reports affordability concerns with their medications: No  Patient reports access/transportation concerns to their pharmacy: No  Patient reports adherence concerns with their medications:  No     Denies using weekly pillbox. Reports has his own system; denies missed doses   Diabetes:   Current medications: Ozempic  1 mg weekly on Thursdays             Reports tolerating well   Recalls recent morning fasting blood sugar: last checked 4/23, recall reading ~115   Denies symptoms of hypoglycemia      Exercise: reports recently has had limited activity as has been busy, but looking to get back gradually to jogging    Statin: pravastatin  20 mg daily   Current medication access support: Enrolled in patient assistance for Ozempic  from [employer] patient assistance program through 05/16/2024     Atrial Fibrillation/Hypertension: Current medications: Eliquis  5 mg twice daily Metoprolol  25 mg twice daily valsartan  320mg  daily   Patient  has an upper arm home BP cuff Recalls last checked home blood pressure on 4/27, reading: 121/79, HR 78   Denies symptoms of hypotension including dizziness/lightheadedness   Exercise: reports staying active recently with yard work   Objective:  Lab Results  Component Value Date   HGBA1C 8.0 (H) 05/25/2023    Lab Results  Component Value Date   CREATININE 1.52 (H) 05/25/2023   BUN 24 05/25/2023   NA 138 05/25/2023   K 4.6 05/25/2023   CL 100 05/25/2023   CO2 28 05/25/2023    Lab Results  Component Value Date   CHOL 163 05/25/2023   HDL 35 (L) 05/25/2023   LDLCALC 89 05/25/2023   TRIG 326 (H) 05/25/2023   CHOLHDL 4.7 05/25/2023    Medications Reviewed Today     Reviewed by Ardis Becton, RPH-CPP (Pharmacist) on 09/12/23 at 1010  Med List Status: <None>   Medication Order Taking? Sig Documenting Provider Last Dose Status Informant  apixaban  (ELIQUIS ) 5 MG TABS tablet [id number] Yes Take 1 tablet (5 mg total) by mouth 2 (two) times daily. Raina Bunting, DO Taking Active   metoprolol  tartrate (LOPRESSOR ) 25 MG tablet [id number] Yes Take 1 tablet (25 mg total) by mouth 2 (two) times daily. Raina Bunting, DO Taking Active   pravastatin  (PRAVACHOL ) 20 MG tablet [id number] Yes Take 1 tablet (20 mg total) by mouth daily. Raina Bunting, DO Taking Active   Semaglutide , 1 MG/DOSE, 4 MG/3ML SOPN [id number] Yes Inject 1 mg as directed  once a week. Raina Bunting, DO Taking Active   sildenafil  (REVATIO ) 20 MG tablet [id number]  Take 1-5 pills about 30 min prior to sex. Start with 1 and increase as needed. Raina Bunting, DO  Active   valsartan  (DIOVAN ) 320 MG tablet [id number] Yes Take 1 tablet (320 mg total) by mouth daily. Raina Bunting, DO Taking Active   zolpidem  (AMBIEN ) 5 MG tablet [id number]  Take 1 tablet (5 mg total) by mouth at bedtime as needed for sleep. Raina Bunting, DO  Active                Assessment/Plan:   Patient plans to contact office to schedule next follow up appointment with PCP  Patient to pick up refill of diabetes test strips from pharmacy  Diabetes: - Reviewed long term cardiovascular and renal outcomes of uncontrolled blood sugar - Reviewed goal A1c, goal fasting, and goal 2 hour post prandial glucose - Reviewed dietary modifications including encourage patient to have regular well-balanced meals throughout the day, while limiting carbohydrate portion sizes              Encourage patient to review nutrition labels for total carbohydrate content of foods - Recommended to monitor home blood sugar, keep log of results, bring this record to medical appointment and contact office if readings outside of established parameters - Patient to contact [employer] as needed for refills of Ozempic    Hypertension/Atrial Fibrillation: - Recommended to monitor home BP and HR, keep log of results, bring this record to medical appointment and contact office if readings outside of established parameters     Follow Up Plan: Clinical Pharmacist to outreach to patient by telephone on 03/09/2024 at 10:00 AM    Arthur Lash, PharmD, Becky Bowels, CPP Clinical Pharmacist [facility] [phone]

## 2023-09-12 NOTE — Patient Instructions (Signed)
 Goals Addressed             This Visit's Progress    Pharmacy Goals       If you need to reach out to patient assistance programs regarding refills or to find out the status of your application, you can do so by calling:  [employer] at [phone]  Our goal bad cholesterol, or LDL, is less than 70 . This is why it is important to continue taking your pravastatin  Please check your home blood pressure, keep a log of the results and bring this with you to your medical appointments.  Feel free to call me with any questions or concerns. I look forward to our next call!  Estelle Grumbles, PharmD, [facility] Clinical Pharmacist [facility] [phone]

## 2023-09-22 ENCOUNTER — Ambulatory Visit: Payer: Medicare Other | Admitting: Dermatology

## 2023-10-27 ENCOUNTER — Ambulatory Visit: Payer: Medicare Other

## 2023-10-27 VITALS — BP 118/76 | HR 79 | Ht 76.0 in | Wt 250.0 lb

## 2023-10-27 DIAGNOSIS — Z0000 Encounter for general adult medical examination without abnormal findings: Secondary | ICD-10-CM | POA: Diagnosis not present

## 2023-10-27 NOTE — Progress Notes (Signed)
 Subjective:   Richard Oconnell is a 68 y.o. who presents for a Medicare Wellness preventive visit.  As a reminder, Annual Wellness Visits don't include a physical exam, and some assessments may be limited, especially if this visit is performed virtually. We may recommend an in-person follow-up visit with your provider if needed.  Visit Complete: Virtual I connected with  Richard Oconnell on 10/27/23 by a audio enabled telemedicine application and verified that I am speaking with the correct person using two identifiers.  Patient Location: Home  Provider Location: Home Office  I discussed the limitations of evaluation and management by telemedicine. The patient expressed understanding and agreed to proceed.  Vital Signs: Because this visit was a virtual/telehealth visit, some criteria may be missing or patient reported. Any vitals not documented were not able to be obtained and vitals that have been documented are patient reported.    Persons Participating in Visit: Patient.  AWV Questionnaire: No: Patient Medicare AWV questionnaire was not completed prior to this visit.  Cardiac Risk Factors include: advanced age (>36men, >51 women);male gender;diabetes mellitus;hypertension     Objective:    Today's Vitals   10/27/23 1356  BP: 118/76  Pulse: 79  Weight: 250 lb (113.4 kg)  Height: 6' 4 (1.93 m)   Body mass index is 30.43 kg/m.     10/27/2023    2:05 PM 10/21/2022    2:11 PM 10/10/2020    4:00 AM 09/25/2020    2:40 PM 09/14/2020   10:42 AM 09/10/2020    8:08 AM 09/08/2020    7:23 AM  Advanced Directives  Does Patient Have a Medical Advance Directive? Yes Yes  No No No No  Type of Estate agent of [place];Living will Healthcare Power of [place];Living will       Does patient want to make changes to medical advance directive? No - Patient declined No - Patient declined       Copy of Healthcare Power of Attorney in Chart? Yes - validated most  recent copy scanned in chart (See row information) Yes - validated most recent copy scanned in chart (See row information)       Would patient like information on creating a medical advance directive?   No - Patient declined  No - Patient declined No - Patient declined No - Patient declined    Current Medications (verified) Outpatient Encounter Medications as of 10/27/2023  Medication Sig   apixaban  (ELIQUIS ) 5 MG TABS tablet Take 1 tablet (5 mg total) by mouth 2 (two) times daily.   metoprolol  tartrate (LOPRESSOR ) 25 MG tablet Take 1 tablet (25 mg total) by mouth 2 (two) times daily.   pravastatin  (PRAVACHOL ) 20 MG tablet Take 1 tablet (20 mg total) by mouth daily.   Semaglutide , 1 MG/DOSE, 4 MG/3ML SOPN Inject 1 mg as directed once a week.   sildenafil  (REVATIO ) 20 MG tablet Take 1-5 pills about 30 min prior to sex. Start with 1 and increase as needed.   valsartan  (DIOVAN ) 320 MG tablet Take 1 tablet (320 mg total) by mouth daily.   zolpidem  (AMBIEN ) 5 MG tablet Take 1 tablet (5 mg total) by mouth at bedtime as needed for sleep.   No facility-administered encounter medications on file as of 10/27/2023.    Allergies (verified) Cyclobenzaprine  and Lisinopril    History: Past Medical History:  Diagnosis Date   Cancer [facility])    Bladder Cancer   Erectile dysfunction associated with type 2 diabetes mellitus (HCC) 05/26/2016  Gross hematuria 11/16/2016   Hypertension 06/11/2015   Type 2 diabetes mellitus with diabetic polyneuropathy, without long-term current use of insulin  (HCC) 06/11/2015   Past Surgical History:  Procedure Laterality Date   boil lanceted     hand   COLONOSCOPY WITH PROPOFOL  N/A 07/03/2020   Procedure: COLONOSCOPY WITH PROPOFOL ;  Surgeon: Luke Salaam, MD;  Location: [facility] ENDOSCOPY;  Service: Gastroenterology;  Laterality: N/A;   CYSTOSCOPY W/ RETROGRADES Bilateral 12/06/2016   Procedure: CYSTOSCOPY WITH RETROGRADE PYELOGRAM;  Surgeon: Dustin Gimenez, MD;  Location: [facility]  ORS;  Service: Urology;  Laterality: Bilateral;   CYSTOSCOPY W/ RETROGRADES Bilateral 06/27/2017   Procedure: CYSTOSCOPY WITH RETROGRADE PYELOGRAM;  Surgeon: Dustin Gimenez, MD;  Location: [facility] ORS;  Service: Urology;  Laterality: Bilateral;   CYSTOSCOPY WITH BIOPSY N/A 06/27/2017   Procedure: CYSTOSCOPY WITH Bladder BIOPSY;  Surgeon: Dustin Gimenez, MD;  Location: [facility] ORS;  Service: Urology;  Laterality: N/A;   CYSTOSCOPY WITH BIOPSY N/A 10/24/2017   Procedure: CYSTOSCOPY WITH Bladder BIOPSY;  Surgeon: Dustin Gimenez, MD;  Location: [facility] ORS;  Service: Urology;  Laterality: N/A;   ingrown  Bilateral    ingrown toenail   IR BILIARY DRAIN PLACEMENT WITH CHOLANGIOGRAM  07/10/2020   IR CHOLANGIOGRAM EXISTING TUBE  08/07/2020   IR RADIOLOGIST EVAL & MGMT  08/07/2020   IR RADIOLOGIST EVAL & MGMT  10/29/2020   IR RADIOLOGIST EVAL & MGMT  11/12/2020   TRANSURETHRAL RESECTION OF BLADDER TUMOR N/A 12/06/2016   Procedure: TRANSURETHRAL RESECTION OF BLADDER TUMOR (TURBT) (2-5cm) CLOT EVACUATION;  Surgeon: Dustin Gimenez, MD;  Location: [facility] ORS;  Service: Urology;  Laterality: N/A;   TRANSURETHRAL RESECTION OF BLADDER TUMOR N/A 06/27/2017   Procedure: TRANSURETHRAL RESECTION OF BLADDER TUMOR (TURBT);  Surgeon: Dustin Gimenez, MD;  Location: [facility] ORS;  Service: Urology;  Laterality: N/A;   TRANSURETHRAL RESECTION OF BLADDER TUMOR WITH MITOMYCIN -C N/A 01/18/2017   Procedure: TRANSURETHRAL RESECTION OF BLADDER TUMOR WITH MITOMYCIN -C-(SMALL);  Surgeon: Dustin Gimenez, MD;  Location: [facility] ORS;  Service: Urology;  Laterality: N/A;   Family History  Problem Relation Age of Onset   Heart attack Mother    Heart disease Mother    Parkinson's disease Father    Heart attack Maternal Grandfather    Heart disease Maternal Grandfather    Heart attack Maternal Aunt    Heart attack Maternal Grandmother    Prostate cancer Neg Hx    Bladder Cancer Neg Hx    Kidney cancer Neg Hx    Social History   Socioeconomic  History   Marital status: Divorced    Spouse name: Not on file   Number of children: Not on file   Years of education: Not on file   Highest education level: Not on file  Occupational History   Not on file  Tobacco Use   Smoking status: Former    Types: Cigars    Passive exposure: Past   Smokeless tobacco: Never   Tobacco comments:    once a while had one cigar >10 years ago  Vaping Use   Vaping status: Never Used  Substance and Sexual Activity   Alcohol use: No    Alcohol/week: 0.0 standard drinks of alcohol   Drug use: No   Sexual activity: Never  Other Topics Concern   Not on file  Social History Narrative   Lives alone   Social Drivers of Health   Financial Resource Strain: Low Risk  (10/27/2023)   Overall Financial Resource Strain (CARDIA)    Difficulty of  Paying Living Expenses: Not hard at all  Food Insecurity: No Food Insecurity (10/27/2023)   Hunger Vital Sign    Worried About Running Out of Food in the Last Year: Never true    Ran Out of Food in the Last Year: Never true  Transportation Needs: No Transportation Needs (10/27/2023)   PRAPARE - Administrator, Civil Service (Medical): No    Lack of Transportation (Non-Medical): No  Physical Activity: Inactive (10/27/2023)   Exercise Vital Sign    Days of Exercise per Week: 0 days    Minutes of Exercise per Session: 0 min  Stress: No Stress Concern Present (10/27/2023)   [employer] of Occupational Health - Occupational Stress Questionnaire    Feeling of Stress: Not at all  Social Connections: Socially Isolated (10/27/2023)   Social Connection and Isolation Panel    Frequency of Communication with Friends and Family: More than three times a week    Frequency of Social Gatherings with Friends and Family: More than three times a week    Attends Religious Services: Never    Database administrator or Organizations: No    Attends Engineer, structural: Never    Marital Status: Divorced     Tobacco Counseling Counseling given: Not Answered Tobacco comments: once a while had one cigar >10 years ago    Clinical Intake:  Pre-visit preparation completed: Yes  Pain : No/denies pain     BMI - recorded: 30.43 Nutritional Status: BMI > 30  Obese Nutritional Risks: None Diabetes: Yes CBG done?: Yes (CBG 110 Per patient) CBG resulted in Enter/ Edit results?: Yes Did pt. bring in CBG monitor from home?: No  Lab Results  Component Value Date   HGBA1C 8.0 (H) 05/25/2023   HGBA1C 8.0 (H) 07/08/2022   HGBA1C 7.3 (A) 01/14/2022     How often do you need to have someone help you when you read instructions, pamphlets, or other written materials from your doctor or pharmacy?: 1 - Never  Interpreter Needed?: No  Information entered by :: Farris Hong LPN   Activities of Daily Living     10/27/2023    2:03 PM  In your present state of health, do you have any difficulty performing the following activities:  Hearing? 0  Vision? 0  Difficulty concentrating or making decisions? 0  Walking or climbing stairs? 0  Dressing or bathing? 0  Doing errands, shopping? 0  Preparing Food and eating ? N  Using the Toilet? N  In the past six months, have you accidently leaked urine? N  Do you have problems with loss of bowel control? N  Managing your Medications? N  Managing your Finances? N  Housekeeping or managing your Housekeeping? N    Patient Care Team: Raina Bunting, DO as PCP - General (Family Medicine) Alla Isaacs, Severa Daniels, RPH-CPP as Pharmacist  I have updated your Care Teams any recent Medical Services you may have received from other providers in the past year.     Assessment:   This is a routine wellness examination for Richard Oconnell.  Hearing/Vision screen Hearing Screening - Comments:: Denies hearing difficulties   Vision Screening - Comments:: Wears rx glasses -Not up to date with routine eye exams. Patient declined referral    Goals Addressed                This Visit's Progress     Increase physical activity (pt-stated)        Continue to Lose  weight       Depression Screen     10/27/2023    2:01 PM 05/25/2023   10:27 AM 10/21/2022    2:06 PM 07/05/2022   10:43 AM 01/13/2022   11:46 AM 05/29/2021    2:15 PM 10/31/2020   10:32 AM  PHQ 2/9 Scores  PHQ - 2 Score 0 0 0 0 0 2 0  PHQ- 9 Score   0 0 3 2 0    Fall Risk     10/27/2023    2:04 PM 05/25/2023   10:27 AM 10/21/2022    2:12 PM 07/05/2022   10:43 AM 03/04/2022    9:57 AM  Fall Risk   Falls in the past year? 0 0 0 0 0  Number falls in past yr: 0  0 0   Injury with Fall? 0 0 0 0   Risk for fall due to : No Fall Risks  No Fall Risks No Fall Risks   Follow up Falls evaluation completed  Falls prevention discussed;Falls evaluation completed Falls evaluation completed     MEDICARE RISK AT HOME:  Medicare Risk at Home Any stairs in or around the home?: Yes If so, are there any without handrails?: No Home free of loose throw rugs in walkways, pet beds, electrical cords, etc?: Yes Adequate lighting in your home to reduce risk of falls?: Yes Life alert?: No Use of a cane, walker or w/c?: No Grab bars in the bathroom?: Yes Shower chair or bench in shower?: No Elevated toilet seat or a handicapped toilet?: No  TIMED UP AND GO:  Was the test performed?    Cognitive Function: 6CIT completed        10/27/2023    2:05 PM 10/21/2022    2:18 PM  6CIT Screen  What Year? 0 points 0 points  What month? 0 points 0 points  What time? 0 points 0 points  Count back from 20 0 points 0 points  Months in reverse 0 points 0 points  Repeat phrase 0 points 0 points  Total Score 0 points 0 points    Immunizations Immunization History  Administered Date(s) Administered   Moderna Sars-Covid-2 Vaccination 11/30/2019, 12/28/2019   Pneumococcal Conjugate-13 05/30/2020    Screening Tests Health Maintenance  Topic Date Due   OPHTHALMOLOGY EXAM  Never done   DTaP/Tdap/Td (1  - Tdap) Never done   Zoster Vaccines- Shingrix (1 of 2) Never done   COVID-19 Vaccine (3 - Moderna risk series) 01/25/2020   FOOT EXAM  01/14/2023   Diabetic kidney evaluation - Urine ACR  07/09/2023   Pneumococcal Vaccine: 50+ Years (2 of 2 - PPSV23) 07/05/2024 (Originally 07/25/2020)   HEMOGLOBIN A1C  11/22/2023   INFLUENZA VACCINE  12/16/2023   Diabetic kidney evaluation - eGFR measurement  05/24/2024   Medicare Annual Wellness (AWV)  10/26/2024   Colonoscopy  07/03/2030   Hepatitis C Screening  Completed   HPV VACCINES  Aged Out   Meningococcal B Vaccine  Aged Out    Health Maintenance  Health Maintenance Due  Topic Date Due   OPHTHALMOLOGY EXAM  Never done   DTaP/Tdap/Td (1 - Tdap) Never done   Zoster Vaccines- Shingrix (1 of 2) Never done   COVID-19 Vaccine (3 - Moderna risk series) 01/25/2020   FOOT EXAM  01/14/2023   Diabetic kidney evaluation - Urine ACR  07/09/2023   Health Maintenance Items Addressed: Patient deferred Ophthalmology referral, Labs and vaccines also deferred  Additional Screening:  Vision  Screening: Recommended annual ophthalmology exams for early detection of glaucoma and other disorders of the eye. Would you like a referral to an eye doctor? No    Dental Screening: Recommended annual dental exams for proper oral hygiene  Community Resource Referral / Chronic Care Management: CRR required this visit?  No   CCM required this visit?  No   Plan:    I have personally reviewed and noted the following in the patient's chart:   Medical and social history Use of alcohol, tobacco or illicit drugs  Current medications and supplements including opioid prescriptions. Patient is not currently taking opioid prescriptions. Functional ability and status Nutritional status Physical activity Advanced directives List of other physicians Hospitalizations, surgeries, and ER visits in previous 12 months Vitals Screenings to include cognitive, depression,  and falls Referrals and appointments  In addition, I have reviewed and discussed with patient certain preventive protocols, quality metrics, and best practice recommendations. A written personalized care plan for preventive services as well as general preventive health recommendations were provided to patient.   Dewayne Ford, LPN   01/13/5620   After Visit Summary: (MyChart) Due to this being a telephonic visit, the after visit summary with patients personalized plan was offered to patient via MyChart   Notes: Nothing significant to report at this time.

## 2023-10-27 NOTE — Patient Instructions (Addendum)
 [name] , Thank you for taking time out of your busy schedule to complete your Annual Wellness Visit with me. I enjoyed our conversation and look forward to speaking with you again next year. I, as well as your care team,  appreciate your ongoing commitment to your health goals. Please review the following plan we discussed and let me know if I can assist you in the future. Your Game plan/ To Do List    Referrals: If you haven't heard from the office you've been referred to, please reach out to them at the phone provided.   Follow up Visits: Next Medicare AWV with our clinical staff: 11/02/24 @ 2p   Have you seen your provider in the last 6 months (3 months if uncontrolled diabetes)?  Next Office Visit with your provider: 11/24/23 @ 10:20a  Clinician Recommendations:  Aim for 30 minutes of exercise or brisk walking, 6-8 glasses of water , and 5 servings of fruits and vegetables each day.       This is a list of the screening recommended for you and due dates:  Health Maintenance  Topic Date Due   Eye exam for diabetics  Never done   DTaP/Tdap/Td vaccine (1 - Tdap) Never done   Zoster (Shingles) Vaccine (1 of 2) Never done   COVID-19 Vaccine (3 - Moderna risk series) 01/25/2020   Complete foot exam   01/14/2023   Yearly kidney health urinalysis for diabetes  07/09/2023   Pneumococcal Vaccine for age over 50 (2 of 2 - PPSV23) 07/05/2024*   Hemoglobin A1C  11/22/2023   Flu Shot  12/16/2023   Yearly kidney function blood test for diabetes  05/24/2024   Medicare Annual Wellness Visit  10/26/2024   Colon Cancer Screening  07/03/2030   Hepatitis C Screening  Completed   HPV Vaccine  Aged Out   Meningitis B Vaccine  Aged Out  *Topic was postponed. The date shown is not the original due date.    Advanced directives: (In Chart) A copy of your advanced directives are scanned into your chart should your provider ever need it. Advance Care Planning is important because it:  [x]  Makes sure  you receive the medical care that is consistent with your values, goals, and preferences  [x]  It provides guidance to your family and loved ones and reduces their decisional burden about whether or not they are making the right decisions based on your wishes.  Follow the link provided in your after visit summary or read over the paperwork we have mailed to you to help you started getting your Advance Directives in place. If you need assistance in completing these, please reach out to us  so that we can help you!  See attachments for Preventive Care and Fall Prevention Tips.

## 2023-11-22 ENCOUNTER — Telehealth: Payer: Self-pay

## 2023-11-22 NOTE — Telephone Encounter (Signed)
 Left message for patient to return call OK to advise     Shipment of Ozempic  in the office patient may pick up at his convenience

## 2023-11-24 ENCOUNTER — Ambulatory Visit

## 2023-11-24 ENCOUNTER — Other Ambulatory Visit: Payer: Self-pay

## 2023-11-24 ENCOUNTER — Encounter: Payer: Self-pay | Admitting: Family Medicine

## 2023-11-24 ENCOUNTER — Other Ambulatory Visit: Payer: Self-pay | Admitting: Family Medicine

## 2023-11-24 ENCOUNTER — Ambulatory Visit (INDEPENDENT_AMBULATORY_CARE_PROVIDER_SITE_OTHER): Admitting: Family Medicine

## 2023-11-24 ENCOUNTER — Ambulatory Visit
Admission: RE | Admit: 2023-11-24 | Discharge: 2023-11-24 | Disposition: A | Discharge status: Home / Self Care | Source: Ambulatory Visit | Attending: Family Medicine | Admitting: Family Medicine

## 2023-11-24 VITALS — BP 122/60 | HR 72 | Ht 76.0 in | Wt 245.8 lb

## 2023-11-24 DIAGNOSIS — I480 Paroxysmal atrial fibrillation: Secondary | ICD-10-CM

## 2023-11-24 DIAGNOSIS — G4700 Insomnia, unspecified: Secondary | ICD-10-CM

## 2023-11-24 DIAGNOSIS — Z0000 Encounter for general adult medical examination without abnormal findings: Secondary | ICD-10-CM

## 2023-11-24 DIAGNOSIS — M25561 Pain in right knee: Secondary | ICD-10-CM | POA: Insufficient documentation

## 2023-11-24 DIAGNOSIS — E1142 Type 2 diabetes mellitus with diabetic polyneuropathy: Secondary | ICD-10-CM | POA: Diagnosis not present

## 2023-11-24 DIAGNOSIS — Z7985 Long-term (current) use of injectable non-insulin antidiabetic drugs: Secondary | ICD-10-CM | POA: Diagnosis not present

## 2023-11-24 DIAGNOSIS — I10 Essential (primary) hypertension: Secondary | ICD-10-CM

## 2023-11-24 DIAGNOSIS — G8929 Other chronic pain: Secondary | ICD-10-CM | POA: Diagnosis not present

## 2023-11-24 DIAGNOSIS — M779 Enthesopathy, unspecified: Secondary | ICD-10-CM | POA: Diagnosis not present

## 2023-11-24 DIAGNOSIS — E1169 Type 2 diabetes mellitus with other specified complication: Secondary | ICD-10-CM

## 2023-11-24 DIAGNOSIS — R351 Nocturia: Secondary | ICD-10-CM

## 2023-11-24 DIAGNOSIS — M1711 Unilateral primary osteoarthritis, right knee: Secondary | ICD-10-CM | POA: Diagnosis not present

## 2023-11-24 LAB — POCT GLYCOSYLATED HEMOGLOBIN (HGB A1C): Hemoglobin A1C: 6.6 % — AB (ref 4.0–5.6)

## 2023-11-24 MED ORDER — GABAPENTIN 100 MG PO CAPS: ORAL_CAPSULE | ORAL | 1 refills | Status: DC

## 2023-11-24 MED ORDER — APIXABAN 5 MG PO TABS
5.0000 mg | ORAL_TABLET | Freq: Two times a day (BID) | ORAL | 1 refills | Status: DC
  Filled 2023-11-24: qty 180, 90d supply, fill #0
  Filled 2024-03-16: qty 180, 90d supply, fill #1

## 2023-11-24 NOTE — Progress Notes (Addendum)
 Subjective:    [name], male    DOB: [birth year], 68 y.o.   MRN: [id number]  Richard Oconnell is a 68 y.o. male presenting on 11/24/2023 for Diabetes and Knee Pain (Pain in right knee)   HPI  Discussed the use of AI scribe software for clinical note transcription with the patient, who gave verbal consent to proceed.  History of Present Illness   Richard Oconnell is a 68 year old male with arthritis who presents with knee pain.  Knee and lower extremity pain - Knee pain radiates to the ankle, especially after prolonged driving - Pain originates in the knee and intensifies by bedtime, interfering with sleep - No pain present in the morning - Unable to take anti-inflammatory medications due to concurrent use of blood thinners - No recent knee x-ray performed, but he is interested in X-ray and further arthritis joint discussion  Sleep disturbance - Sleep disrupted by worsening knee pain at night - Occasionally uses a generic form of Ambien  for sleep - Prefers to avoid opioid medications due to concern for side effects  Type 2 Diabetes BMI 29 - Weight decreased from 273 pounds in January to 245 pounds currently, on lifestyle and GLP therapy - Also Weight loss attributed to dental issues and subsequent tooth extraction - Hemoglobin A1c decreased from 8 to 6.6, attributed to dietary changes following dental procedure - Continue on Ozempic  1mg  weekly Currently doing well on Novo Nordisk PAP  Paroxysmal Atrial Fibrillation Followed by cardiology On anticoagulation, BB for rate       11/24/2023   10:30 AM 10/27/2023    2:01 PM 05/25/2023   10:27 AM  Depression screen PHQ 2/9  Decreased Interest 0 0 0  Down, Depressed, Hopeless 0 0 0  PHQ - 2 Score 0 0 0  Altered sleeping 0    Tired, decreased energy 0    Change in appetite 0    Feeling bad or failure about yourself  0    Trouble concentrating 0    Moving slowly or fidgety/restless 0    Suicidal  thoughts 0    PHQ-9 Score 0    Difficult doing work/chores Not difficult at all         11/24/2023   10:30 AM 05/25/2023   10:27 AM 07/05/2022   10:43 AM 01/13/2022   11:46 AM  GAD 7 : Generalized Anxiety Score  Nervous, Anxious, on Edge 0 0 0 0  Control/stop worrying 0 0 0 0  Worry too much - different things 0 0 0 0  Trouble relaxing 0 0 0 1  Restless 0 0 0 0  Easily annoyed or irritable 0 0 0 1  Afraid - awful might happen 0 0 0 0  Total GAD 7 Score 0 0 0 2  Anxiety Difficulty Not difficult at all  Not difficult at all Not difficult at all    Social History   Tobacco Use   Smoking status: Former    Types: Cigars    Passive exposure: Past   Smokeless tobacco: Never   Tobacco comments:    once a while had one cigar >10 years ago  Vaping Use   Vaping status: Never Used  Substance Use Topics   Alcohol use: No    Alcohol/week: 0.0 standard drinks of alcohol   Drug use: No    Review of Systems Per HPI unless specifically indicated above     Objective:    BP 122/60 (BP  Location: Left Arm, Patient Position: Sitting, Cuff Size: Large)   Pulse 72   Ht 6' 4 (1.93 m)   Wt 245 lb 12.8 oz (111.5 kg)   SpO2 100%   BMI 29.92 kg/m   Wt Readings from Last 3 Encounters:  11/24/23 245 lb 12.8 oz (111.5 kg)  10/27/23 250 lb (113.4 kg)  05/25/23 273 lb (123.8 kg)    Physical Exam Vitals and nursing note reviewed.  Constitutional:      General: He is not in acute distress.    Appearance: He is well-developed. He is obese. He is not diaphoretic.     Comments: Well-appearing, comfortable, cooperative  HENT:     Head: Normocephalic and atraumatic.  Eyes:     General:        Right eye: No discharge.        Left eye: No discharge.     Conjunctiva/sclera: Conjunctivae normal.  Neck:     Thyroid : No thyromegaly.  Cardiovascular:     Rate and Rhythm: Normal rate and regular rhythm.     Pulses: Normal pulses.     Heart sounds: Normal heart sounds. No murmur  heard. Pulmonary:     Effort: Pulmonary effort is normal. No respiratory distress.     Breath sounds: Normal breath sounds. No wheezing or rales.  Musculoskeletal:        General: Normal range of motion.     Cervical back: Normal range of motion and neck supple.     Comments: R knee with crepitus on exam, full range of motion. No joint line tenderness.  Lymphadenopathy:     Cervical: No cervical adenopathy.  Skin:    General: Skin is warm and dry.     Findings: No erythema or rash.  Neurological:     Mental Status: He is alert and oriented to person, place, and time. Mental status is at baseline.  Psychiatric:        Behavior: Behavior normal.     Comments: Well groomed, good eye contact, normal speech and thoughts     Recent Labs    05/25/23 1513 11/24/23 1036  HGBA1C 8.0* 6.6*    Results for orders placed or performed in visit on 11/24/23  POCT glycosylated hemoglobin (Hb A1C)   Collection Time: 11/24/23 10:36 AM  Result Value Ref Range   Hemoglobin A1C 6.6 (A) 4.0 - 5.6 %   HbA1c POC (<> result, manual entry)     HbA1c, POC (prediabetic range)     HbA1c, POC (controlled diabetic range)        Assessment & Plan:   Problem List Items Addressed This Visit     Atrial fibrillation (HCC)   Relevant Medications   apixaban  (ELIQUIS ) 5 MG TABS tablet   Type 2 diabetes mellitus with diabetic polyneuropathy, without long-term current use of insulin  (HCC) - Primary   Relevant Medications   gabapentin  (NEURONTIN ) 100 MG capsule   Other Relevant Orders   POCT glycosylated hemoglobin (Hb A1C) (Completed)   Other Visit Diagnoses       Chronic pain of right knee       Relevant Medications   gabapentin  (NEURONTIN ) 100 MG capsule   Other Relevant Orders   DG Knee Complete 4 Views Right     Long-term current use of injectable noninsulin antidiabetic medication         Essential hypertension       Relevant Medications   apixaban  (ELIQUIS ) 5 MG TABS tablet  RIGHT  Knee Pain Chronic knee pain likely due to arthritis, exacerbated by prolonged driving and at night. Unable to take NSAIDs. Tylenol  ineffective. Gabapentin  discussed as a non-opioid option. Cortisone injections and topical analgesics considered. Knee replacement discussed but deferred. - Order right knee x-ray. - Prescribe gabapentin , one dose in the evening. Can titrate up to max 300mg  per day, 100 THREE TIMES A DAY or 300 QHS - Consider cortisone injection if pain persists. - Discuss potential use of topical analgesics like Salonpas, Voltaren  Type 2 Diabetes Mellitus Improved glycemic control with A1c of 6.6%. Weight loss contributing to management. Controlled on Ozempic  1mg  weekly inj  Hypertension Managed with valsartan . Recent refill obtained.  Atrial Fibrillation Managed with Eliquis . Three-month supply obtained from a different pharmacy.  General Health Maintenance Weight loss from 273 lbs to 245 lbs over six months due to dietary changes.  Follow-up Follow-up for knee pain management and diabetes control. - Review x-ray results and adjust treatment plan. - Schedule annual follow-up in January for full panel and diabetes management. - Contact if knee pain worsens or medication issues arise.        Orders Placed This Encounter  Procedures   DG Knee Complete 4 Views Right    Standing Status:   Future    Number of Occurrences:   1    Expiration Date:   11/23/2024    Reason for Exam (SYMPTOM  OR DIAGNOSIS REQUIRED):   chronic right knee pain, eval arthritis    Preferred imaging location?:   [facility]   POCT glycosylated hemoglobin (Hb A1C)    Associate with Z13.1    Meds ordered this encounter  Medications   apixaban  (ELIQUIS ) 5 MG TABS tablet    Sig: Take 1 tablet (5 mg total) by mouth 2 (two) times daily.    Dispense:  180 tablet    Refill:  1    Add refills   gabapentin  (NEURONTIN ) 100 MG capsule    Sig: Start 1 capsule daily, increase by 1 cap every 2-3  days as tolerated up to 3 times a day, or may take 3 at once in evening.    Dispense:  90 capsule    Refill:  1    Follow up plan: Return for 6 month fasting lab > 1 week later Annual Physical.  Future labs ordered for 05/29/24   Marsa Officer, DO [facility] 11/24/2023, 10:35 AM

## 2023-11-24 NOTE — Patient Instructions (Addendum)
 Thank you for coming to the office today.  Recent Labs    05/25/23 1513 11/24/23 1036  HGBA1C 8.0* 6.6*   X-ray today for Knee, stay tuned for result update  Improved weight loss and A1c improved!  Refilled Eliquis  to [facility]  Other meds will be refilled to local pharmacy as needed, you have extra refills.  Start Gabapentin  100mg  capsules, take at night for 2-3 nights only, and then increase to 2 times a day for a few days, and then may increase to 3 times a day, it may make you drowsy, if helps significantly at night only, then you can increase instead to 3 capsules at night, instead of 3 times a day - In the future if needed, we can significantly increase the dose if tolerated well, some common doses are 300mg  three times a day up to 600mg  three times a day, usually it takes several weeks or months to get to higher doses  DUE for FASTING BLOOD WORK (no food or drink after midnight before the lab appointment, only water  or coffee without cream/sugar on the morning of)  For Lab Results, once available within 2-3 days of blood draw, you can can log in to MyChart online to view your results and a brief explanation. Also, we can discuss results at next follow-up visit.   Please schedule a Follow-up Appointment to: Return for 6 month fasting lab > 1 week later Annual Physical.  If you have any other questions or concerns, please feel free to call the office or send a message through MyChart. You may also schedule an earlier appointment if necessary.  Additionally, you may be receiving a survey about your experience at our office within a few days to 1 week by e-mail or mail. We value your feedback.  Marsa Officer, DO [facility], [place]

## 2023-11-25 NOTE — Telephone Encounter (Signed)
 Requested medications are due for refill today.  yes  Requested medications are on the active medications list.  yes  Last refill. 09/01/2023 #20 0 rf  Future visit scheduled.   yes  Notes to clinic.  Refill not delegated.    Requested Prescriptions  Pending Prescriptions Disp Refills   zolpidem  (AMBIEN ) 5 MG tablet [Pharmacy Med Name: ZOLPIDEM  TARTRATE 5 MG TABLET] 20 tablet 0    Sig: Take 1 tablet (5 mg total) by mouth at bedtime as needed for sleep.     Not Delegated - Psychiatry:  Anxiolytics/Hypnotics Failed - 11/25/2023  4:02 PM      Failed - This refill cannot be delegated      Failed - Urine Drug Screen completed in last 360 days      Passed - Valid encounter within last 6 months    Recent Outpatient Visits           Yesterday Type 2 diabetes mellitus with diabetic polyneuropathy, without long-term current use of insulin  [facility])   [facility] Edman, Marsa PARAS, DO       Future Appointments             In 6 months Edman, Marsa PARAS, DO  [facility]

## 2023-11-28 ENCOUNTER — Ambulatory Visit: Payer: Self-pay | Admitting: Family Medicine

## 2023-11-28 DIAGNOSIS — G8929 Other chronic pain: Secondary | ICD-10-CM

## 2023-12-02 ENCOUNTER — Other Ambulatory Visit: Payer: Self-pay | Admitting: Pharmacist

## 2023-12-02 ENCOUNTER — Encounter: Payer: Self-pay | Admitting: Pharmacist

## 2023-12-02 DIAGNOSIS — Z7985 Long-term (current) use of injectable non-insulin antidiabetic drugs: Secondary | ICD-10-CM

## 2023-12-02 DIAGNOSIS — E1142 Type 2 diabetes mellitus with diabetic polyneuropathy: Secondary | ICD-10-CM

## 2023-12-02 NOTE — Progress Notes (Signed)
   12/02/2023  [name], male   DOB: 05-19-1955, 68 y.o.   MRN: [id number]  As of 7/25, Ozempic  will no longer be eligible for automatic refill from Novo Nordisk. To request refills after this date, program must receive a refill request form from office up to 30 days prior to refill being due.  Outreach to patient today and provide this update. Advise patient to monitor her supply of Ozempic  at home and when has only a 1 month supply remaining, to contact [facility]: Phone: [phone] to request CPhT start refill form with PCP to be sent to program.  Sharyle Sia, PharmD, JAQUELINE, CPP Clinical Pharmacist [facility] [phone]

## 2023-12-02 NOTE — Patient Instructions (Signed)
 As of 7/25, Ozempic  will no longer be automatically refilled from Novo Nordisk patient assistance program. To request refills after this date, program must receive a refill request form from office up to 30 days prior to refill being due.   Please monitor your supply of Ozempic  at home and when you have only a 1 month supply remaining, contact [facility] at [phone] to request she start refill process for you.   Thank you!   Sharyle Sia, PharmD, [facility] Clinical Pharmacist [facility] [phone]

## 2023-12-21 MED ORDER — GABAPENTIN 300 MG PO CAPS: 300.0000 mg | ORAL_CAPSULE | Freq: Three times a day (TID) | ORAL | 2 refills | Status: DC

## 2023-12-21 NOTE — Addendum Note (Signed)
 Addended by: EDMAN MARSA PARAS on: 12/21/2023 07:17 PM   Modules accepted: Orders

## 2024-01-23 ENCOUNTER — Ambulatory Visit (INDEPENDENT_AMBULATORY_CARE_PROVIDER_SITE_OTHER): Admitting: Family Medicine

## 2024-01-23 ENCOUNTER — Encounter: Payer: Self-pay | Admitting: Family Medicine

## 2024-01-23 VITALS — BP 132/78 | HR 73 | Ht 76.0 in | Wt 264.5 lb

## 2024-01-23 DIAGNOSIS — Z23 Encounter for immunization: Secondary | ICD-10-CM

## 2024-01-23 DIAGNOSIS — G8929 Other chronic pain: Secondary | ICD-10-CM

## 2024-01-23 DIAGNOSIS — M25561 Pain in right knee: Secondary | ICD-10-CM

## 2024-01-23 DIAGNOSIS — M1711 Unilateral primary osteoarthritis, right knee: Secondary | ICD-10-CM

## 2024-01-23 MED ORDER — LIDOCAINE HCL (PF) 1 % IJ SOLN
4.0000 mL | Freq: Once | INTRAMUSCULAR | Status: AC
  Administered 2024-01-23: 4 mL

## 2024-01-23 MED ORDER — METHYLPREDNISOLONE ACETATE 40 MG/ML IJ SUSP
40.0000 mg | Freq: Once | INTRAMUSCULAR | Status: AC
  Administered 2024-01-23: 40 mg via INTRA_ARTICULAR

## 2024-01-23 NOTE — Progress Notes (Signed)
 Subjective:    [name], male    DOB: [birth year], 68 y.o.   MRN: [id number]  Richard Oconnell is a 68 y.o. male presenting on 01/23/2024 for Knee Pain (Right )   HPI  Discussed the use of AI scribe software for clinical note transcription with the patient, who gave verbal consent to proceed.  History of Present Illness   Richard Oconnell is a 68 year old male who presents with knee pain for evaluation and management of osteoarthritis.  Chronic Right Knee Pain / Arthritis  Chronic persistent problem worse in past several months Knee pain and functional impairment - Progressively worsening knee pain over the past six months - Pain originates in the knee and often radiates to the ankle, especially when driving - Pain severity described as severe and 'the worst thing in the world' when driving - Pain is exacerbated by sitting and somewhat relieved by walking - Pain disrupts sleep and causes difficulty finding a comfortable position at night - No significant knee issues six months ago - Rapid progression of symptoms is concerning to him  Neuropathic symptoms - History of neuropathy with approximately 60-70% improvement on gabapentin  - Residual neuropathic symptoms persist despite treatment  Medication use and efficacy - Gabapentin  used for pain and neuropathy - Gabapentin  ineffective for knee pain unless dose exceeds 900 mg, which he is reluctant to take - No gabapentin  taken today      01/23/2024   10:58 AM 11/24/2023   10:30 AM 10/27/2023    2:01 PM  Depression screen PHQ 2/9  Decreased Interest 0 0 0  Down, Depressed, Hopeless 0 0 0  PHQ - 2 Score 0 0 0  Altered sleeping 0 0   Tired, decreased energy 0 0   Change in appetite 0 0   Feeling bad or failure about yourself  0 0   Trouble concentrating 1 0   Moving slowly or fidgety/restless 0 0   Suicidal thoughts 0 0   PHQ-9 Score 1 0   Difficult doing work/chores Not difficult at all Not  difficult at all        01/23/2024   10:58 AM 11/24/2023   10:30 AM 05/25/2023   10:27 AM 07/05/2022   10:43 AM  GAD 7 : Generalized Anxiety Score  Nervous, Anxious, on Edge 0 0 0 0  Control/stop worrying 0 0 0 0  Worry too much - different things 0 0 0 0  Trouble relaxing 1 0 0 0  Restless 0 0 0 0  Easily annoyed or irritable 0 0 0 0  Afraid - awful might happen 0 0 0 0  Total GAD 7 Score 1 0 0 0  Anxiety Difficulty Not difficult at all Not difficult at all  Not difficult at all    Social History   Tobacco Use   Smoking status: Former    Types: Cigars    Passive exposure: Past   Smokeless tobacco: Never   Tobacco comments:    once a while had one cigar >10 years ago  Vaping Use   Vaping status: Never Used  Substance Use Topics   Alcohol use: No    Alcohol/week: 0.0 standard drinks of alcohol   Drug use: No    Review of Systems Per HPI unless specifically indicated above     Objective:    BP 132/78 (BP Location: Left Arm, Patient Position: Sitting, Cuff Size: Large)   Pulse 73   Ht 6' 4 (  1.93 m)   Wt 264 lb 8 oz (120 kg)   SpO2 97%   BMI 32.20 kg/m   Wt Readings from Last 3 Encounters:  01/23/24 264 lb 8 oz (120 kg)  11/24/23 245 lb 12.8 oz (111.5 kg)  10/27/23 250 lb (113.4 kg)    Physical Exam Vitals and nursing note reviewed.  Constitutional:      General: He is not in acute distress.    Appearance: He is well-developed. He is obese. He is not diaphoretic.     Comments: Well-appearing, comfortable, cooperative  HENT:     Head: Normocephalic and atraumatic.  Eyes:     General:        Right eye: No discharge.        Left eye: No discharge.     Conjunctiva/sclera: Conjunctivae normal.  Neck:     Thyroid : No thyromegaly.  Cardiovascular:     Rate and Rhythm: Normal rate and regular rhythm.     Pulses: Normal pulses.     Heart sounds: Normal heart sounds. No murmur heard. Pulmonary:     Effort: Pulmonary effort is normal. No respiratory distress.      Breath sounds: Normal breath sounds. No wheezing or rales.  Musculoskeletal:        General: Normal range of motion.     Cervical back: Normal range of motion and neck supple.     Comments: R knee with crepitus on exam, full range of motion. No joint line tenderness.  Lymphadenopathy:     Cervical: No cervical adenopathy.  Skin:    General: Skin is warm and dry.     Findings: No erythema or rash.  Neurological:     Mental Status: He is alert and oriented to person, place, and time. Mental status is at baseline.  Psychiatric:        Behavior: Behavior normal.     Comments: Well groomed, good eye contact, normal speech and thoughts       ________________________________________________________ PROCEDURE NOTE Date: 01/23/24 Right Knee Joint injection Discussed benefits and risks (including pain, bleeding, infection, steroid flare). Verbal consent given by patient. Medication:  1 cc Depo-medrol  40mg  and 4 cc Lidocaine  1% without epi Time Out taken  Landmarks identified. Area cleansed with alcohol wipes. Using 21 gauge and 1, 1/2 inch needle, Right knee joint space was injected (with above listed medication) via lateral approach cold spray used for superficial anesthetic. Sterile bandage placed. Patient tolerated procedure well without bleeding or paresthesias. No complications.   I have personally reviewed the radiology report from 11/26/23 on RIGHT KNEE X-ray.  CLINICAL DATA:  Chronic right knee pain.   EXAM: RIGHT KNEE - COMPLETE 4+ VIEW   COMPARISON:  None Available.   FINDINGS: Mild lateral patellofemoral joint space narrowing. Mild tricompartmental peripheral spurring. There is also a central spur in the medial femoral condyle. No fracture. No erosions or focal bone abnormality. No joint effusion.   IMPRESSION: Mild tricompartmental osteoarthritis.     Electronically Signed   By: Andrea Gasman M.D.   On: 11/26/2023 19:14  Results for orders placed or  performed in visit on 11/24/23  POCT glycosylated hemoglobin (Hb A1C)   Collection Time: 11/24/23 10:36 AM  Result Value Ref Range   Hemoglobin A1C 6.6 (A) 4.0 - 5.6 %   HbA1c POC (<> result, manual entry)     HbA1c, POC (prediabetic range)     HbA1c, POC (controlled diabetic range)        Assessment &  Plan:   Problem List Items Addressed This Visit   None Visit Diagnoses       Chronic pain of right knee    -  Primary   Relevant Medications   methylPREDNISolone  acetate (DEPO-MEDROL ) injection 40 mg (Completed)   lidocaine  (PF) (XYLOCAINE ) 1 % injection 4 mL (Completed)     Primary osteoarthritis of right knee       Relevant Medications   methylPREDNISolone  acetate (DEPO-MEDROL ) injection 40 mg (Completed)   lidocaine  (PF) (XYLOCAINE ) 1 % injection 4 mL (Completed)     Flu vaccine need       Relevant Orders   Flu vaccine HIGH DOSE PF(Fluzone Trivalent) (Completed)       Chronic Osteoarthritis of right knee with associated pain Mild tricompartmental osteoarthritis with pain radiating to the ankle, exacerbated by sitting, affecting sleep. X-rays show joint space narrowing and bone spurs. Gabapentin  at 300 TID mg ineffective for pain relief. Prefers non-surgical options.  - Administer 1st cortisone injection to the right knee. - Monitor response to cortisone injection over 24 hours. - Consider Orthopedics for future treatments but plan to avoid surgery at this time. - Consider MRI or advanced imaging if symptoms do not improve.  Peripheral neuropathy Peripheral neuropathy symptoms reduced by 60-70% with gabapentin . Gabapentin  ineffective for knee pain unless dosage exceeds 900 mg, which he is reluctant to do. Neuropathy symptoms less severe compared to knee pain. - Monitor neuropathy symptoms and adjust treatment as needed.        Orders Placed This Encounter  Procedures   Flu vaccine HIGH DOSE PF(Fluzone Trivalent)    Meds ordered this encounter  Medications    methylPREDNISolone  acetate (DEPO-MEDROL ) injection 40 mg   lidocaine  (PF) (XYLOCAINE ) 1 % injection 4 mL    Follow up plan: Return if symptoms worsen or fail to improve.   Marsa Officer, DO [facility] 01/23/2024, 10:52 AM

## 2024-01-23 NOTE — Addendum Note (Signed)
 Addended by: EDMAN MARSA PARAS on: 01/23/2024 01:10 PM   Modules accepted: Level of Service

## 2024-01-23 NOTE — Patient Instructions (Addendum)
Thank you for coming to the office today.  You received a Right Knee Joint steroid injection today. - Lidocaine numbing medicine may ease the pain initially for a few hours until it wears off - As discussed, you may experience a "steroid flare" this evening or within 24-48 hours, anytime medicine is injected into an inflamed joint it can cause the pain to get worse temporarily - Everyone responds differently to these injections, it depends on the patient and the severity of the joint problem, it may provide anywhere from days to weeks, to months of relief. Ideal response is >6 months relief - Try to take it easy for next 1-2 days, avoid over activity and strain on joint (limit walking for knee) - Recommend the following:   - For swelling - rest, compression sleeve / ACE wrap, elevation, and ice packs as needed for first few days   - For pain in future may use heating pad or moist heat as needed   Please schedule a Follow-up Appointment to: Return if symptoms worsen or fail to improve.  If you have any other questions or concerns, please feel free to call the office or send a message through [place]. You may also schedule an earlier appointment if necessary.  Additionally, you may be receiving a survey about your experience at our office within a few days to 1 week by e-mail or mail. We value your feedback.  Nobie Putnam, DO [place]

## 2024-02-07 ENCOUNTER — Telehealth: Payer: Self-pay

## 2024-02-07 NOTE — Telephone Encounter (Signed)
 Completed refill form for Ozempic  (novo Nordisk) and faxed to providers office for review and signature.

## 2024-02-17 ENCOUNTER — Other Ambulatory Visit (HOSPITAL_COMMUNITY): Payer: Self-pay

## 2024-02-17 ENCOUNTER — Other Ambulatory Visit: Payer: Self-pay | Admitting: Pharmacist

## 2024-02-17 ENCOUNTER — Encounter: Payer: Self-pay | Admitting: Pharmacist

## 2024-02-17 DIAGNOSIS — E1142 Type 2 diabetes mellitus with diabetic polyneuropathy: Secondary | ICD-10-CM

## 2024-02-17 DIAGNOSIS — Z7985 Long-term (current) use of injectable non-insulin antidiabetic drugs: Secondary | ICD-10-CM

## 2024-02-17 NOTE — Progress Notes (Signed)
   02/17/2024  [name], male   DOB: 27-Jul-1955, 68 y.o.   MRN: [id number]  Patient enrolled in Ozempic  patient assistance program from [employer] through 05/16/2024  [employer] has announced that they will no longer offer Ozempic  to [employer] beneficiaries through their Patient Assistance Program in 2026.   Outreach to patient today and provide this update.   Note patient has previously advised that he has applied for Extra Help subsidy in the past, but did not qualify at that time based on assets. States that he is not sure if he might qualify now - Patient states will follow up with his cousin who will help him to review/look at applying for Extra Help again  Counsel patient that Medicare's Annual Enrollment Period for 2026 starts 02/29/2024. Encourage patient to review deductibles formulary coverage and copay amounts (including for Ozempic ) between plans. Also share with patient the contact information for the [facility] Information Program [facility]) that can help with reviewing these Medicare plans As requested will share this information with patient via MyChart message  Sharyle Sia, PharmD, JAQUELINE, CPP Clinical Pharmacist [facility] [phone]

## 2024-02-17 NOTE — Patient Instructions (Signed)
 [employer], the maker of Ozempic  and Rybelsus , has announced that they will no longer offer these medications to Medicare beneficiaries through their Patient Assistance Program in 2026.    This means that if you currently receive Ozempic  or Rybelsus  at no cost through the program, starting in January 2026 you'll need to use your insurance to continue on these medicines.    Choosing a Medicare Plan   With Medicare's Annual Enrollment Period starting on October 15th, we encourage you to review formulary coverage and copay amounts for Ozempic  or Rybelsus , as costs can vary widely between plans. Starting on Wednesday, October 1st you can compare your Medicare plan options by going to the Plan Finder tool at [employer].gov ( TeleconferenceOnDemand.fr ).    There are Medicare Specialists through the [employer] Insurance Information Program ([place]) that can help you shop for Medicare plans.    [place] [facility] toll free number: [phone] (Monday - Friday 8 am - 5 pm)   There are representatives located in each county:    [facility] [address]     [place]  [address] [phone] Call for an appointment with [facility]      Medicare Extra Help   Some patients may qualify for a program called Medicare Extra Help that could help lower the cost of prescriptions on your chosen Medicare plan. If you meet the below income criteria, please visit [email or web address].[email or web address] to apply, or respond to this message and one of our pharmacy team members can call you to help apply.      Your Household Size Annual income limit Resource Limit*  Individual [id number] $17,600  Married Couple [phone] $35,130    *Resources include:   -  Money in a checking, savings, or retirement account   -  Stocks   -  Bonds   Medicare Does NOT consider the following in your resource limit:   -  Your home   -  One car   -  Burial  plot   -  Up to $1,500 for burial expenses if you have put that money aside   -  Furniture     Sharyle Sia, PharmD, [employer], CPP Clinical Pharmacist [employer] [phone]

## 2024-02-20 ENCOUNTER — Encounter: Payer: Self-pay | Admitting: Family Medicine

## 2024-03-02 NOTE — Progress Notes (Signed)
 Richard Oconnell                                          MRN: [id number]   03/02/2024   The VBCI Quality Team Specialist reviewed this patient medical record for the purposes of chart review for care gap closure. The following were reviewed: chart review for care gap closure-diabetic eye exam.    VBCI Quality Team

## 2024-03-09 ENCOUNTER — Other Ambulatory Visit

## 2024-03-16 ENCOUNTER — Ambulatory Visit: Attending: Medical | Admitting: Medical

## 2024-03-16 ENCOUNTER — Other Ambulatory Visit (HOSPITAL_BASED_OUTPATIENT_CLINIC_OR_DEPARTMENT_OTHER): Payer: Self-pay

## 2024-03-16 ENCOUNTER — Other Ambulatory Visit: Payer: Self-pay

## 2024-03-16 ENCOUNTER — Encounter: Payer: Self-pay | Admitting: Medical

## 2024-03-16 VITALS — BP 128/78 | HR 72 | Ht 76.0 in | Wt 267.4 lb

## 2024-03-16 DIAGNOSIS — I480 Paroxysmal atrial fibrillation: Secondary | ICD-10-CM

## 2024-03-16 DIAGNOSIS — I10 Essential (primary) hypertension: Secondary | ICD-10-CM | POA: Diagnosis not present

## 2024-03-16 DIAGNOSIS — E119 Type 2 diabetes mellitus without complications: Secondary | ICD-10-CM | POA: Diagnosis not present

## 2024-03-16 DIAGNOSIS — E1169 Type 2 diabetes mellitus with other specified complication: Secondary | ICD-10-CM

## 2024-03-16 DIAGNOSIS — E785 Hyperlipidemia, unspecified: Secondary | ICD-10-CM

## 2024-03-16 MED ORDER — FENOFIBRATE 145 MG PO TABS
145.0000 mg | ORAL_TABLET | Freq: Every day | ORAL | 3 refills | Status: AC
  Filled 2024-03-16 (×2): qty 90, 90d supply, fill #0
  Filled 2024-06-19: qty 90, 90d supply, fill #1

## 2024-03-16 NOTE — Progress Notes (Signed)
 Cardiology Office Note   Date:  03/16/2024  ID:  Rollo Farquhar, DOB 1955-06-02, MRN [id number] PCP: Edman Marsa PARAS, DO  [facility] Providers Cardiologist:  None  History of Present Illness Richard Oconnell is a 68 y.o. male with a hx of HTN, DM2, bladder CA, OSA not on CPAP (he does not feel he has OSA), Pafib on Eliquis  who presents for 1 year follow-up.    He presented to [facility] 09/25/2020 with large abdominal abscess and severe septic shock with bacteremia and multiorgan failure, complicated by severe metabolic acidosis and toxic metabolic encephalopathy. Per family, the patient became disoriented and then skin appeared jaundiced, leading him to present to [facility]. Cardiology consulted for possible PAF with OAC not recommended in the setting of high risk for bleeding with liver dysfunction and large abscess. Amiodarone  was noted as reportedly discontinued in the setting of junctional rhythm. On review of previous EKGs at time of consultation, the team was able to find evidence of atrial fibrillation from 07/09/2020, though on review of prior to admission medications, he had not been maintained on anticoagulation since Afib EKG on February 2022. He was initially started on amiodarone  for his atrial fibrillation; however, this was discontinued due to bradycardia/junctional rhythm.  He was initially not started on any anticoagulation due to his comorbidities at the time of admission and including sepsis, abdominal abscess with drainage, and elevated LFTs.  By discharge, he was anticoagulated with Eliquis .  Due to elevated BP, it was noted that he should start on ACE/ARB if renal function would allow at RTC.   09/27/2020 echo showed EF 60 to 65%, NR WMA, G2 DD, RVSP 21.5 mmHg, and no valve vegetation.  The patient was last seen 11/2022 and was overall doing well from a cardiac perspective.   Today, the patient is overall doing well. He has a head cold. He had a steroid shot in  his knee. He denies chest pain, SOB, lower leg edema,  he can't exercise as before due to knee. He denies lightheadedness, dizziness, palpitations. HE is in NSR. No bleeding issues with Eliquis .   Studies Reviewed EKG Interpretation Date/Time:  Friday March 16 2024 10:56:55 EDT Ventricular Rate:  72 PR Interval:  222 QRS Duration:  118 QT Interval:  376 QTC Calculation: 411 R Axis:   -58  Text Interpretation: Sinus rhythm with 1st degree A-V block Left axis deviation Incomplete left bundle branch block When compared with ECG of 13-Dec-2022 14:02, No significant change was found Confirmed by Franchester, Jontue Crumpacker ([id number]) on 03/16/2024 10:58:27 AM    Echo 2022  1. Left ventricular ejection fraction, by estimation, is 60 to 65%. The  left ventricle has normal function. The left ventricle has no regional  wall motion abnormalities. Left ventricular diastolic parameters are  consistent with Grade II diastolic  dysfunction (pseudonormalization).   2. Right ventricular systolic function is normal. The right ventricular  size is normal. There is normal pulmonary artery systolic pressure. The  estimated right ventricular systolic pressure is 21.5 mmHg.   3. The mitral valve is normal in structure. No evidence of mitral valve  regurgitation. No evidence of mitral stenosis.   4. No valve vegetation.       STOP-Bang Score:         Physical Exam VS:  BP 128/78 (BP Location: Left Arm, Patient Position: Sitting, Cuff Size: Large)   Pulse 72   Ht 6' 4 (1.93 m)   Wt 267 lb 6 oz (121.3 kg)  SpO2 98%   BMI 32.55 kg/m        Wt Readings from Last 3 Encounters:  03/16/24 267 lb 6 oz (121.3 kg)  01/23/24 264 lb 8 oz (120 kg)  11/24/23 245 lb 12.8 oz (111.5 kg)    GEN: Well nourished, well developed in no acute distress NECK: No JVD; No carotid bruits CARDIAC: RRR, no murmurs, rubs, gallops RESPIRATORY:  Clear to auscultation without rales, wheezing or rhonchi  ABDOMEN: Soft, non-tender,  non-distended EXTREMITIES:  No edema; No deformity   ASSESSMENT AND PLAN  Pafib The patient remains in NSR. Continue Eliquis  5mg  BID. He denies bleeding issues. Continue Lopressor  25mg  BID.   HLD LDL 96, TG 326, HDL 35, TC 163. He reports poor diet. Discussed healthy diet/lifestyle changes today. I will add on fenofibrate. Continue pravastatin  20mg  daily.   DM2 A1C 8.0. this is followed by PCP.   HTN BP is good today. Continue Lopressor  25mg  daily and Valsartan  320mg  daily.      Dispo: Follow-up in 1 year  Signed, Rawley Harju VEAR Fishman, PA-C

## 2024-03-16 NOTE — Patient Instructions (Signed)
 Medication Instructions:  Your physician recommends the following medication changes.  START TAKING: Fenofibrate 145 mg by mouth daily   *If you need a refill on your cardiac medications before your next appointment, please call your pharmacy*  Lab Work: No labs ordered today    Testing/Procedures: No test ordered today   Follow-Up: At [facility], you and your health needs are our priority.  As part of our continuing mission to provide you with exceptional heart care, our providers are all part of one team.  This team includes your primary Cardiologist (physician) and Advanced Practice Providers or APPs (Physician Assistants and Nurse Practitioners) who all work together to provide you with the care you need, when you need it.  Your next appointment:   1 year(s)  Provider:   Mikey Fishman, PA-C

## 2024-03-26 ENCOUNTER — Other Ambulatory Visit: Payer: Self-pay | Admitting: Family Medicine

## 2024-03-26 DIAGNOSIS — G4700 Insomnia, unspecified: Secondary | ICD-10-CM

## 2024-03-28 NOTE — Telephone Encounter (Signed)
 Requested medication (s) are due for refill today: Yes  Requested medication (s) are on the active medication list: Yes  Last refill:  11/25/23  Future visit scheduled: Yes  Notes to clinic:  Unable to refill per protocol, cannot delegate.      Requested Prescriptions  Pending Prescriptions Disp Refills   zolpidem  (AMBIEN ) 5 MG tablet [Pharmacy Med Name: ZOLPIDEM  TARTRATE 5 MG TABLET] 20 tablet 0    Sig: Take 1 tablet (5 mg total) by mouth at bedtime as needed for sleep.     Not Delegated - Psychiatry:  Anxiolytics/Hypnotics Failed - 03/28/2024  4:25 PM      Failed - This refill cannot be delegated      Failed - Urine Drug Screen completed in last 360 days      Passed - Valid encounter within last 6 months    Recent Outpatient Visits           2 months ago Chronic pain of right knee   [facility], Marsa PARAS, DO   4 months ago Type 2 diabetes mellitus with diabetic polyneuropathy, without long-term current use of insulin  [facility])   [facility], Marsa PARAS, DO       Future Appointments             In 2 months Edman, Marsa PARAS, DO  [facility] [address]

## 2024-03-30 ENCOUNTER — Other Ambulatory Visit: Admitting: Pharmacist

## 2024-03-30 DIAGNOSIS — Z7985 Long-term (current) use of injectable non-insulin antidiabetic drugs: Secondary | ICD-10-CM

## 2024-03-30 DIAGNOSIS — I10 Essential (primary) hypertension: Secondary | ICD-10-CM

## 2024-03-30 DIAGNOSIS — E1142 Type 2 diabetes mellitus with diabetic polyneuropathy: Secondary | ICD-10-CM

## 2024-03-30 DIAGNOSIS — I480 Paroxysmal atrial fibrillation: Secondary | ICD-10-CM

## 2024-03-30 NOTE — Progress Notes (Signed)
 03/30/2024 Name: Freedom Peddy MRN: [id number] DOB: 12-21-1955  Chief Complaint  Patient presents with   Medication Assistance   Medication Management    Cornelio Parkerson is a 69 y.o. year old male who presented for a telephone visit.   They were referred to the pharmacist by their PCP for assistance in managing medication access.      Subjective:   Care Team: Primary Care Provider: Edman Marsa PARAS, DO; Next Scheduled Visit: 06/05/2024 Cardiologist: [facility]  Medication Access/Adherence  Current Pharmacy:  [facility] - [place], [place] - [address] [place] [address] Phone: [phone] Fax: [phone]  [facility] - [place], [place] - [address] [place] [address] Phone: [phone] Fax: [phone]  [id number] [name], [place] - [address] AT [facility] [address] [place] [address] [place] [address] Phone: [phone] Fax: [phone]  [facility] - [facility] [address] [place] [address] Phone: [phone] Fax: [phone]   Patient reports affordability concerns with their medications: No  Patient reports access/transportation concerns to their pharmacy: No  Patient reports adherence concerns with their medications:  No     Denies using weekly pillbox. Reports has his own system; denies missed doses  Reports SHIIP counselor helped him to re-apply for Extra Help in October  Also signed up for [facility] plan for 2026, but unsure of plan name at this time.     Diabetes:   Current medications: Ozempic  1 mg weekly on Thursdays             Reports tolerating well   Recalls recent morning fasting blood sugar: last checked 03/27/2024: 129; recently ranging 115-129   Denies symptoms of hypoglycemia      Exercise: reports staying active recently with yard work    Statin: pravastatin  20 mg daily +  now also started fenofibrate 145 mg daily ~03/17/2024 as prescribed by Cardiologist   Current medication access support: Enrolled in patient assistance for Ozempic  from [employer] patient assistance program through 05/16/2024 - [employer] has announced that they will no longer offer Ozempic  to Medicare beneficiaries through their Patient Assistance Program in 2026  - Reports has ~2 months supply of Ozempic  remaining    Atrial Fibrillation/Hypertension: Current medications: Eliquis  5 mg twice daily Metoprolol  25 mg twice daily valsartan  320mg  daily   Patient has an upper arm home BP cuff Recalls last checked home blood pressure - 11/11: 124/87, HR 66 - 11/8: 132/84, HR 68   Denies symptoms of hypotension including dizziness/lightheadedness   Exercise: reports staying active recently with yard work   Objective:  Lab Results  Component Value Date   HGBA1C 6.6 (A) 11/24/2023    Lab Results  Component Value Date   CREATININE 1.52 (H) 05/25/2023   BUN 24 05/25/2023   NA 138 05/25/2023   K 4.6 05/25/2023   CL 100 05/25/2023   CO2 28 05/25/2023    Lab Results  Component Value Date   CHOL 163 05/25/2023   HDL 35 (L) 05/25/2023   LDLCALC 89 05/25/2023   TRIG 326 (H) 05/25/2023   CHOLHDL 4.7 05/25/2023   BP Readings from Last 3 Encounters:  03/16/24 128/78  01/23/24 132/78  11/24/23 122/60   Pulse Readings from Last 3 Encounters:  03/16/24 72  01/23/24 73  11/24/23 72  Medications Reviewed Today     Reviewed by Alana Sharyle LABOR, RPH-CPP (Pharmacist) on 03/30/24 at 1043  Med List Status: <None>   Medication Order Taking? Sig Documenting Provider Last Dose Status Informant  apixaban  (ELIQUIS ) 5 MG TABS tablet [id number] Yes Take 1 tablet (5 mg total) by mouth 2 (two) times daily. Karamalegos, Marsa PARAS, DO  Active   fenofibrate (TRICOR) 145 MG tablet [id number] Yes Take 1 tablet (145 mg total) by mouth daily. Furth, Cadence H, PA-C  Active   gabapentin   (NEURONTIN ) 300 MG capsule [id number]  Take 1 capsule (300 mg total) by mouth 3 (three) times daily.  Patient taking differently: Take 300 mg by mouth 3 (three) times daily as needed.   Edman Marsa PARAS, DO  Active   metoprolol  tartrate (LOPRESSOR ) 25 MG tablet [id number] Yes Take 1 tablet (25 mg total) by mouth 2 (two) times daily. Edman Marsa PARAS, DO  Active   pravastatin  (PRAVACHOL ) 20 MG tablet [id number]  Take 1 tablet (20 mg total) by mouth daily. Edman Marsa PARAS, DO  Active   Semaglutide , 1 MG/DOSE, 4 MG/3ML SOPN [id number] Yes Inject 1 mg as directed once a week. Edman Marsa PARAS, DO  Active   sildenafil  (REVATIO ) 20 MG tablet [id number]  Take 1-5 pills about 30 min prior to sex. Start with 1 and increase as needed. Edman Marsa PARAS, DO  Active   valsartan  (DIOVAN ) 320 MG tablet [id number] Yes Take 1 tablet (320 mg total) by mouth daily. Edman Marsa PARAS, DO  Active   zolpidem  (AMBIEN ) 5 MG tablet [id number]  Take 1 tablet (5 mg total) by mouth at bedtime as needed for sleep. Edman Marsa PARAS, DO  Active               Assessment/Plan:    Again encourage patient to consider using weekly pillbox, but patient declines. Reports current system works well for him.  - Willing to discuss possible pill packaging during our next call   Diabetes: - Reviewed long term cardiovascular and renal outcomes of uncontrolled blood sugar - Reviewed goal A1c, goal fasting, and goal 2 hour post prandial glucose - Reviewed dietary modifications including encourage patient to have regular well-balanced meals throughout the day, while limiting carbohydrate portion sizes              Encourage patient to review nutrition labels for total carbohydrate content of foods - Recommended to monitor home blood sugar, keep log of results, bring this record to medical appointment and contact office if readings outside of established parameters - Patient to contact  [employer] as needed for refills of Ozempic    Hypertension/Atrial Fibrillation: - Recommended to monitor home BP and HR, keep log of results, bring this record to medical appointment and contact office if readings outside of established parameters     Follow Up Plan: Clinical Pharmacist to outreach to patient by telephone on 05/21/2024 at 11:00 AM    Sharyle Alana, PharmD, JAQUELINE, CPP Clinical Pharmacist [facility] [phone]

## 2024-03-30 NOTE — Patient Instructions (Signed)
 Goals Addressed             This Visit's Progress    Pharmacy Goals       The goal A1c is less than 7%. This is the best way to reduce the risk of the long term complications of diabetes, including heart disease, kidney disease, eye disease, strokes, and nerve damage. An A1c of less than 7% corresponds with fasting sugars less than 130 and 2 hour after meal sugars less than 180.   Our goal is to maintain lower triglyceride and LDL levels. This is why it is important to continue taking your pravastatin  and fenofibrate in addition to exercise most days of the week and reduce dietary intake of cholesterol, sugars and carbohydrates  Please check your home blood pressure, keep a log of the results and bring this with you to your medical appointments.  Feel free to call me with any questions or concerns. I look forward to our next call!   Sharyle Sia, PharmD, [facility] [employer] Clinical Pharmacist [facility] [phone]

## 2024-04-16 ENCOUNTER — Encounter: Payer: Self-pay | Admitting: Family Medicine

## 2024-04-27 ENCOUNTER — Ambulatory Visit: Admitting: Family Medicine

## 2024-04-27 ENCOUNTER — Encounter: Payer: Self-pay | Admitting: Family Medicine

## 2024-04-27 ENCOUNTER — Other Ambulatory Visit: Payer: Self-pay

## 2024-04-27 VITALS — BP 130/78 | HR 69 | Ht 76.0 in | Wt 274.1 lb

## 2024-04-27 DIAGNOSIS — G4700 Insomnia, unspecified: Secondary | ICD-10-CM

## 2024-04-27 DIAGNOSIS — M25561 Pain in right knee: Secondary | ICD-10-CM

## 2024-04-27 DIAGNOSIS — E1142 Type 2 diabetes mellitus with diabetic polyneuropathy: Secondary | ICD-10-CM | POA: Diagnosis not present

## 2024-04-27 DIAGNOSIS — G8929 Other chronic pain: Secondary | ICD-10-CM

## 2024-04-27 DIAGNOSIS — M1711 Unilateral primary osteoarthritis, right knee: Secondary | ICD-10-CM | POA: Diagnosis not present

## 2024-04-27 DIAGNOSIS — I480 Paroxysmal atrial fibrillation: Secondary | ICD-10-CM

## 2024-04-27 MED ORDER — APIXABAN 5 MG PO TABS
5.0000 mg | ORAL_TABLET | Freq: Two times a day (BID) | ORAL | 1 refills | Status: AC
  Filled 2024-04-27 – 2024-06-19 (×2): qty 180, 90d supply, fill #0

## 2024-04-27 MED ORDER — LIDOCAINE HCL (PF) 1 % IJ SOLN
4.0000 mL | Freq: Once | INTRAMUSCULAR | Status: AC
  Administered 2024-04-27: 4 mL

## 2024-04-27 MED ORDER — METHYLPREDNISOLONE ACETATE 40 MG/ML IJ SUSP
40.0000 mg | Freq: Once | INTRAMUSCULAR | Status: AC
  Administered 2024-04-27: 40 mg via INTRA_ARTICULAR

## 2024-04-27 NOTE — Patient Instructions (Addendum)
Thank you for coming to the office today.  You received a Right Knee Joint steroid injection today. - Lidocaine numbing medicine may ease the pain initially for a few hours until it wears off - As discussed, you may experience a "steroid flare" this evening or within 24-48 hours, anytime medicine is injected into an inflamed joint it can cause the pain to get worse temporarily - Everyone responds differently to these injections, it depends on the patient and the severity of the joint problem, it may provide anywhere from days to weeks, to months of relief. Ideal response is >6 months relief - Try to take it easy for next 1-2 days, avoid over activity and strain on joint (limit walking for knee) - Recommend the following:   - For swelling - rest, compression sleeve / ACE wrap, elevation, and ice packs as needed for first few days   - For pain in future may use heating pad or moist heat as needed   Please schedule a Follow-up Appointment to: Return if symptoms worsen or fail to improve.  If you have any other questions or concerns, please feel free to call the office or send a message through [place]. You may also schedule an earlier appointment if necessary.  Additionally, you may be receiving a survey about your experience at our office within a few days to 1 week by e-mail or mail. We value your feedback.  Nobie Putnam, DO [place]

## 2024-04-27 NOTE — Progress Notes (Signed)
 Subjective:    [name], male    DOB: 09/21/1955, 68 y.o.   MRN: [id number]  Richard Oconnell is a 68 y.o. male presenting on 04/27/2024 for Knee Pain (Right )   HPI  Discussed the use of AI scribe software for clinical note transcription with the patient, who gave verbal consent to proceed.  History of Present Illness   Richard Oconnell is a 68 year old male who presents for management of chronic knee pain and medication review.  Chronic Right Knee Pain / Arthritis  Chronic persistent problem worse in past several months Knee pain and functional impairment - Progressively worsening knee pain over the past 6-12 months - Pain originates in the knee and often radiates to the ankle, especially when driving - Pain severity described as severe and worst with driving and prolonged flexion - Pain is exacerbated by sitting and somewhat relieved by walking - Pain disrupts sleep and causes difficulty finding a comfortable position at night - No significant knee issues six months ago  Prior knee cortisone injection 01/2024 with success Here for repeat one  Analgesic medication efficacy and side effects - Gabapentin  300 mg taken for pain is ineffective - Gabapentin  causes cognitive side effects described as feeling 'goofy' - Tylenol  is ineffective for pain control - Occasional use of Ambien  to aid sleep due to pain-related sleep disruption - History of opioid prescription but no regular usage of opioid  Neuropathic symptoms - History of neuropathy with approximately 60-70% improvement on gabapentin  - Residual neuropathic symptoms persist despite treatment   Medication use and efficacy - Gabapentin  used for pain and neuropathy - Gabapentin  ineffective for knee pain unless dose exceeds 900 mg, which he is reluctant to take - No gabapentin  taken today  Paroxysmal Atrial Fibrillation Anticoagulation therapy Followed by Cardiology - Currently taking  Eliquis  - Difficulty obtaining Eliquis  from usual pharmacy  Insomnia Chronic problem, affected by pain with knee On Zolpidem  5mg  AS NEEDED  Type 2 Diabetes with Neuropathy A1c 6.6, previous 8 range Controlled on Ozempic  1mg  weekly Has upcoming physical 05/2024       04/27/2024   10:22 AM 01/23/2024   10:58 AM 11/24/2023   10:30 AM  Depression screen PHQ 2/9  Decreased Interest 0 0 0  Down, Depressed, Hopeless 0 0 0  PHQ - 2 Score 0 0 0  Altered sleeping 0 0 0  Tired, decreased energy  0 0  Change in appetite 1 0 0  Feeling bad or failure about yourself  0 0 0  Trouble concentrating 0 1 0  Moving slowly or fidgety/restless 0 0 0  Suicidal thoughts 0 0 0  PHQ-9 Score 1 1  0   Difficult doing work/chores Not difficult at all Not difficult at all Not difficult at all     Data saved with a previous flowsheet row definition       04/27/2024   10:23 AM 01/23/2024   10:58 AM 11/24/2023   10:30 AM 05/25/2023   10:27 AM  GAD 7 : Generalized Anxiety Score  Nervous, Anxious, on Edge 0 0 0 0  Control/stop worrying 0 0 0 0  Worry too much - different things 0 0 0 0  Trouble relaxing 1 1 0 0  Restless 0 0 0 0  Easily annoyed or irritable 0 0 0 0  Afraid - awful might happen 0 0 0 0  Total GAD 7 Score 1 1 0 0  Anxiety Difficulty Not difficult  at all Not difficult at all Not difficult at all     Social History[1]  Review of Systems Per HPI unless specifically indicated above     Objective:    BP 130/78 (BP Location: Right Arm, Patient Position: Sitting, Cuff Size: Large)   Pulse 69   Ht 6' 4 (1.93 m)   Wt 274 lb 2 oz (124.3 kg)   SpO2 95%   BMI 33.37 kg/m   Wt Readings from Last 3 Encounters:  04/27/24 274 lb 2 oz (124.3 kg)  03/16/24 267 lb 6 oz (121.3 kg)  01/23/24 264 lb 8 oz (120 kg)    Physical Exam Vitals and nursing note reviewed.  Constitutional:      General: He is not in acute distress.    Appearance: He is well-developed. He is obese. He is not  diaphoretic.     Comments: Well-appearing, comfortable, cooperative  HENT:     Head: Normocephalic and atraumatic.  Eyes:     General:        Right eye: No discharge.        Left eye: No discharge.     Conjunctiva/sclera: Conjunctivae normal.  Neck:     Thyroid : No thyromegaly.  Cardiovascular:     Rate and Rhythm: Normal rate and regular rhythm.     Pulses: Normal pulses.     Heart sounds: Normal heart sounds. No murmur heard. Pulmonary:     Effort: Pulmonary effort is normal. No respiratory distress.     Breath sounds: Normal breath sounds. No wheezing or rales.  Musculoskeletal:        General: Normal range of motion.     Cervical back: Normal range of motion and neck supple.     Comments: R knee with crepitus on exam, full range of motion. No joint line tenderness.  Lymphadenopathy:     Cervical: No cervical adenopathy.  Skin:    General: Skin is warm and dry.     Findings: No erythema or rash.  Neurological:     Mental Status: He is alert and oriented to person, place, and time. Mental status is at baseline.  Psychiatric:        Behavior: Behavior normal.     Comments: Well groomed, good eye contact, normal speech and thoughts    PROCEDURE NOTE Date: 04/27/24 Right Knee Joint injection Discussed benefits and risks (including pain, bleeding, infection, steroid flare). Verbal consent given by patient. Medication:  1 cc Depo-medrol  40mg  and 4 cc Lidocaine  1% without epi Time Out taken  Landmarks identified. Area cleansed with alcohol wipes. Using 21 gauge and 1, 1/2 inch needle, Right knee joint space was injected (with above listed medication) via lateral approach cold spray used for superficial anesthetic. Sterile bandage placed. Patient tolerated procedure well without bleeding or paresthesias. No complications.  I have personally reviewed the radiology report from 11/26/23 on RIGHT KNEE X-ray.   CLINICAL DATA:  Chronic right knee pain.   EXAM: RIGHT KNEE -  COMPLETE 4+ VIEW   COMPARISON:  None Available.   FINDINGS: Mild lateral patellofemoral joint space narrowing. Mild tricompartmental peripheral spurring. There is also a central spur in the medial femoral condyle. No fracture. No erosions or focal bone abnormality. No joint effusion.   IMPRESSION: Mild tricompartmental osteoarthritis.     Electronically Signed   By: Andrea Gasman M.D.   On: 11/26/2023 19:14  Diabetic Foot Exam - Simple   Simple Foot Form Diabetic Foot exam was performed with the following  findings: Yes 04/27/2024 10:48 AM  Visual Inspection See comments: Yes Sensation Testing See comments: Yes Pulse Check Posterior Tibialis and Dorsalis pulse intact bilaterally: Yes Comments Bilateral feet with callus formation and neuropathy reduced monofilament sensation. Partial toenail removal from podiatry.       Results for orders placed or performed in visit on 11/24/23  POCT glycosylated hemoglobin (Hb A1C)   Collection Time: 11/24/23 10:36 AM  Result Value Ref Range   Hemoglobin A1C 6.6 (A) 4.0 - 5.6 %   HbA1c POC (<> result, manual entry)     HbA1c, POC (prediabetic range)     HbA1c, POC (controlled diabetic range)        Assessment & Plan:   Problem List Items Addressed This Visit     Atrial fibrillation (HCC)   Relevant Medications   apixaban  (ELIQUIS ) 5 MG TABS tablet   Insomnia   Type 2 diabetes mellitus with diabetic polyneuropathy, without long-term current use of insulin  (HCC)   Other Visit Diagnoses       Chronic pain of right knee    -  Primary   Relevant Medications   methylPREDNISolone  acetate (DEPO-MEDROL ) injection 40 mg (Completed)   lidocaine  (PF) (XYLOCAINE ) 1 % injection 4 mL (Completed)     Primary osteoarthritis of right knee       Relevant Medications   methylPREDNISolone  acetate (DEPO-MEDROL ) injection 40 mg (Completed)   lidocaine  (PF) (XYLOCAINE ) 1 % injection 4 mL (Completed)       Chronic Osteoarthritis of right  knee with associated pain Mild tricompartmental osteoarthritis with pain radiating to the ankle, exacerbated by sitting, affecting sleep. X-rays show joint space narrowing and bone spurs. Gabapentin  at 300 TID mg ineffective for pain relief. Prefers non-surgical options.   Cortisone joint injections have been successful for improving pain and improving function He has continued w/ series of injections at this point prior to pursuing orthopedics 1st injection 01/2024  - Administer #2 cortisone injection to the right knee. - Monitor response to cortisone injection over 24 hours. - Consider Orthopedics for future treatments but plan to avoid surgery at this time. - Consider MRI or advanced imaging if symptoms do not improve.   Paroxysmal atrial fibrillation Followed by Cardiology Managed with Eliquis . Issues with obtaining Eliquis  from local pharmacies, transitioning to [facility] may affect access. - Sent Eliquis  prescription to pharmacy to ensure continuity of supply.  Type 2 Diabetes with neuropathy Controlled A1c 6.6 On Ozempic  1mg  weekly Upcoming physical  Insomnia Chronic problem Secondary to pain  On Zolpidem  5mg  PRN  No orders of the defined types were placed in this encounter.   Meds ordered this encounter  Medications   methylPREDNISolone  acetate (DEPO-MEDROL ) injection 40 mg   lidocaine  (PF) (XYLOCAINE ) 1 % injection 4 mL   apixaban  (ELIQUIS ) 5 MG TABS tablet    Sig: Take 1 tablet (5 mg total) by mouth 2 (two) times daily.    Dispense:  180 tablet    Refill:  1    Add refills    Follow up plan: Return if symptoms worsen or fail to improve.  Has upcoming physical 05/2024  Marsa Officer, DO [facility] 04/27/2024, 10:41 AM     [1]  Social History Tobacco Use   Smoking status: Former    Types: Cigars    Passive exposure: Past   Smokeless tobacco: Never   Tobacco comments:    once a while had one cigar >10  years ago  Vaping  Use   Vaping status: Never Used  Substance Use Topics   Alcohol use: No    Alcohol/week: 0.0 standard drinks of alcohol   Drug use: No

## 2024-05-21 ENCOUNTER — Other Ambulatory Visit: Admitting: Pharmacist

## 2024-05-21 DIAGNOSIS — Z7985 Long-term (current) use of injectable non-insulin antidiabetic drugs: Secondary | ICD-10-CM

## 2024-05-21 DIAGNOSIS — E1169 Type 2 diabetes mellitus with other specified complication: Secondary | ICD-10-CM

## 2024-05-21 DIAGNOSIS — I10 Essential (primary) hypertension: Secondary | ICD-10-CM

## 2024-05-21 DIAGNOSIS — I480 Paroxysmal atrial fibrillation: Secondary | ICD-10-CM

## 2024-05-21 DIAGNOSIS — E1142 Type 2 diabetes mellitus with diabetic polyneuropathy: Secondary | ICD-10-CM

## 2024-05-21 NOTE — Progress Notes (Signed)
 "  05/21/2024 Name: Richard Oconnell MRN: [id number] DOB: [birth year]  Chief Complaint  Patient presents with   Medication Management   Medication Assistance    Richard Oconnell is a 69 y.o. year old male who presented for a telephone visit.   They were referred to the pharmacist by their PCP for assistance in managing medication access.      Subjective:   Care Team: Primary Care Provider: Edman Marsa PARAS, DO; Next Scheduled Visit: 06/05/2024 Cardiologist: [facility] [place]  Medication Access/Adherence  Current Pharmacy:  [facility] - [place], [place] - [address] [place] [address] Phone: [phone] Fax: [phone]  [facility] - [place], [place] - [address] [place] [address] Phone: [phone] Fax: [phone]  [id number] [name], [place] - [address] AT [facility] [address] [place] [address] [place] [address] Phone: [phone] Fax: [phone]  [facility] [address] [place] [address] Phone: [phone] Fax: [phone]   Patient reports affordability concerns with their medications: No  Patient reports access/transportation concerns to their pharmacy: No  Patient reports adherence concerns with their medications:  No     Denies using weekly pillbox. Reports has his own system; denies missed doses    Reports signed up for [employer] Medicare plan for 2026     Diabetes:   Current medications: Ozempic  1 mg weekly on Thursdays             Reports tolerating well   Denies checking home blood sugar recently   Denies symptoms of hypoglycemia   Reports has been eating more fast food and more carbohydrates recently     Exercise: reports staying active recently with yard work    Statin: pravastatin  20 mg daily + fenofibrate  145 mg daily (started taking ~03/17/2024 as prescribed by Cardiologist)    Current medication access support: none     Atrial Fibrillation/Hypertension: Current medications: Eliquis  5 mg twice daily Metoprolol  25 mg twice daily valsartan  320mg  daily   Patient has an upper arm home BP cuff Recalls last checked home blood pressure -1/1: 123/81, HR 68 12/31: 126/77, HR 71   Denies symptoms of hypotension including dizziness/lightheadedness   Exercise: reports recently limited by pain/bone spurs in his knee     Objective:  Lab Results  Component Value Date   HGBA1C 6.6 (A) 11/24/2023    Lab Results  Component Value Date   CREATININE 1.52 (H) 05/25/2023   BUN 24 05/25/2023   NA 138 05/25/2023   K 4.6 05/25/2023   CL 100 05/25/2023   CO2 28 05/25/2023    Lab Results  Component Value Date   CHOL 163 05/25/2023   HDL 35 (L) 05/25/2023   LDLCALC 89 05/25/2023   TRIG 326 (H) 05/25/2023   CHOLHDL 4.7 05/25/2023   BP Readings from Last 3 Encounters:  04/27/24 130/78  03/16/24 128/78  01/23/24 132/78   Pulse Readings from Last 3 Encounters:  04/27/24 69  03/16/24 72  01/23/24 73     Medications Reviewed Today     Reviewed by Alana Sharyle LABOR, RPH-CPP (Pharmacist) on 05/21/24 at 1134  Med List Status: <None>   Medication Order Taking? Sig Documenting Provider Last Dose Status Informant  apixaban  (ELIQUIS ) 5 MG TABS tablet [id number] Yes Take 1 tablet (5 mg total) by mouth 2 (two)  times daily. Edman Marsa PARAS, DO  Active   fenofibrate  (TRICOR ) 145 MG tablet [id number] Yes Take 1 tablet (145 mg total) by mouth daily. Furth, Cadence H, PA-C  Active   gabapentin  (NEURONTIN ) 300 MG capsule [id number]  Take 1 capsule (300 mg total) by mouth 3 (three) times daily.  Patient taking differently: Take 300 mg by mouth 3 (three) times daily as needed.   Edman Marsa PARAS, DO  Active   metoprolol  tartrate (LOPRESSOR ) 25 MG tablet [id number] Yes Take 1 tablet (25 mg total) by mouth 2 (two) times daily. Karamalegos, Marsa PARAS, DO   Active   pravastatin  (PRAVACHOL ) 20 MG tablet [id number] Yes Take 1 tablet (20 mg total) by mouth daily. Edman Marsa PARAS, DO  Active   Semaglutide , 1 MG/DOSE, 4 MG/3ML SOPN [id number] Yes Inject 1 mg as directed once a week. Edman Marsa PARAS, DO  Active   sildenafil  (REVATIO ) 20 MG tablet [id number]  Take 1-5 pills about 30 min prior to sex. Start with 1 and increase as needed. Edman Marsa PARAS, DO  Active   valsartan  (DIOVAN ) 320 MG tablet [id number] Yes Take 1 tablet (320 mg total) by mouth daily. Edman Marsa PARAS, DO  Active   zolpidem  (AMBIEN ) 5 MG tablet [id number]  Take 1 tablet (5 mg total) by mouth at bedtime as needed for sleep. Edman Marsa PARAS, DO  Active               Assessment/Plan:   Review with patient that per [facility] website, the [employer] Gold [employer] (HMO-POS) plan has a $250 annual prescription deductible (impacting tiers 3-5) and tier 3 copayment of $47 (for medications such as Ozempic  and Eliquis ) in 2026. Note out of pocket prescription drug cost in 2026 is capped at $2,100.  Again encourage patient to consider using weekly pillbox, but patient declines. Reports current system works well for him.  - Willing to discuss possible pill packaging during our next call   Diabetes: - Reviewed long term cardiovascular and renal outcomes of uncontrolled blood sugar - Reviewed goal A1c, goal fasting, and goal 2 hour post prandial glucose - Reviewed dietary modifications including encourage patient to have regular well-balanced meals throughout the day, while limiting carbohydrate portion sizes  Encourage patient to return to more positive eating habits including reducing intake of fast food and controlling carbohydrate portion sizes - Recommended to monitor home blood sugar, keep log of results, bring this record to medical appointment and contact office if readings outside of established parameters - Patient to contact Novo Nordisk as  needed for refills of Ozempic    Hypertension/Atrial Fibrillation: - Recommended to monitor home BP and HR, keep log of results, bring this record to medical appointment and contact office if readings outside of established parameters     Follow Up Plan: Clinical Pharmacist to outreach to patient by telephone on 09/03/2024 at 11:00 AM    Sharyle Sia, PharmD, JAQUELINE, CPP Clinical Pharmacist [facility] [phone]   "

## 2024-05-21 NOTE — Patient Instructions (Signed)
 Goals Addressed             This Visit's Progress    Pharmacy Goals       The goal A1c is less than 7%. This is the best way to reduce the risk of the long term complications of diabetes, including heart disease, kidney disease, eye disease, strokes, and nerve damage. An A1c of less than 7% corresponds with fasting sugars less than 130 and 2 hour after meal sugars less than 180.   Our goal is to maintain lower triglyceride and LDL levels. This is why it is important to continue taking your pravastatin  and fenofibrate in addition to exercise most days of the week and reduce dietary intake of cholesterol, sugars and carbohydrates  Please check your home blood pressure, keep a log of the results and bring this with you to your medical appointments.  Feel free to call me with any questions or concerns. I look forward to our next call!   Sharyle Sia, PharmD, [facility] [employer] Clinical Pharmacist [facility] [phone]

## 2024-05-29 ENCOUNTER — Other Ambulatory Visit

## 2024-05-29 DIAGNOSIS — E1169 Type 2 diabetes mellitus with other specified complication: Secondary | ICD-10-CM

## 2024-05-29 DIAGNOSIS — E1142 Type 2 diabetes mellitus with diabetic polyneuropathy: Secondary | ICD-10-CM

## 2024-05-29 DIAGNOSIS — Z0000 Encounter for general adult medical examination without abnormal findings: Secondary | ICD-10-CM

## 2024-05-29 DIAGNOSIS — R351 Nocturia: Secondary | ICD-10-CM

## 2024-05-29 DIAGNOSIS — I480 Paroxysmal atrial fibrillation: Secondary | ICD-10-CM

## 2024-05-29 DIAGNOSIS — I10 Essential (primary) hypertension: Secondary | ICD-10-CM

## 2024-05-30 LAB — CBC WITH DIFFERENTIAL/PLATELET
Absolute Lymphocytes: 3128 {cells}/uL (ref 850–3900)
Absolute Monocytes: 683 {cells}/uL (ref 200–950)
Basophils Absolute: 68 {cells}/uL (ref 0–200)
Basophils Relative: 0.9 %
Eosinophils Absolute: 300 {cells}/uL (ref 15–500)
Eosinophils Relative: 4 %
HCT: 50.6 % (ref 39.4–51.1)
Hemoglobin: 16.9 g/dL (ref 13.2–17.1)
MCH: 30.9 pg (ref 27.0–33.0)
MCHC: 33.4 g/dL (ref 31.6–35.4)
MCV: 92.5 fL (ref 81.4–101.7)
MPV: 10.7 fL (ref 7.5–12.5)
Monocytes Relative: 9.1 %
Neutro Abs: 3323 {cells}/uL (ref 1500–7800)
Neutrophils Relative %: 44.3 %
Platelets: 311 Thousand/uL (ref 140–400)
RBC: 5.47 Million/uL (ref 4.20–5.80)
RDW: 12.1 % (ref 11.0–15.0)
Total Lymphocyte: 41.7 %
WBC: 7.5 Thousand/uL (ref 3.8–10.8)

## 2024-05-30 LAB — MICROALBUMIN / CREATININE URINE RATIO
Creatinine, Urine: 113 mg/dL (ref 20–320)
Microalb Creat Ratio: 4 mg/g{creat}
Microalb, Ur: 0.4 mg/dL

## 2024-05-30 LAB — LIPID PANEL
Cholesterol: 138 mg/dL
HDL: 36 mg/dL — ABNORMAL LOW
LDL Cholesterol (Calc): 69 mg/dL
Non-HDL Cholesterol (Calc): 102 mg/dL
Total CHOL/HDL Ratio: 3.8 (calc)
Triglycerides: 241 mg/dL — ABNORMAL HIGH

## 2024-05-30 LAB — COMPREHENSIVE METABOLIC PANEL WITH GFR
AG Ratio: 1.4 (calc) (ref 1.0–2.5)
ALT: 30 U/L (ref 9–46)
AST: 32 U/L (ref 10–35)
Albumin: 4.4 g/dL (ref 3.6–5.1)
Alkaline phosphatase (APISO): 71 U/L (ref 35–144)
BUN/Creatinine Ratio: 20 (calc) (ref 6–22)
BUN: 38 mg/dL — ABNORMAL HIGH (ref 7–25)
CO2: 26 mmol/L (ref 20–32)
Calcium: 9.7 mg/dL (ref 8.6–10.3)
Chloride: 105 mmol/L (ref 98–110)
Creat: 1.91 mg/dL — ABNORMAL HIGH (ref 0.70–1.35)
Globulin: 3.2 g/dL (ref 1.9–3.7)
Glucose, Bld: 138 mg/dL — ABNORMAL HIGH (ref 65–99)
Potassium: 5.3 mmol/L (ref 3.5–5.3)
Sodium: 139 mmol/L (ref 135–146)
Total Bilirubin: 0.4 mg/dL (ref 0.2–1.2)
Total Protein: 7.6 g/dL (ref 6.1–8.1)
eGFR: 37 mL/min/1.73m2 — ABNORMAL LOW

## 2024-05-30 LAB — TSH: TSH: 1.06 m[IU]/L (ref 0.40–4.50)

## 2024-05-30 LAB — HEMOGLOBIN A1C
Hgb A1c MFr Bld: 7.9 % — ABNORMAL HIGH
Mean Plasma Glucose: 180 mg/dL
eAG (mmol/L): 10 mmol/L

## 2024-05-30 LAB — PSA: PSA: 0.38 ng/mL

## 2024-05-31 NOTE — Progress Notes (Signed)
 Richard Oconnell                                          MRN: [id number]   05/31/2024   The [facility] Quality Team Specialist reviewed this patient medical record for the purposes of chart review for care gap closure. The following were reviewed: chart review for care gap closure-kidney health evaluation for diabetes:eGFR  and uACR.    [facility]BCI Quality Team

## 2024-06-05 ENCOUNTER — Encounter: Payer: Self-pay | Admitting: Family Medicine

## 2024-06-05 ENCOUNTER — Ambulatory Visit: Admitting: Family Medicine

## 2024-06-05 VITALS — BP 130/70 | HR 67 | Ht 76.0 in | Wt 271.5 lb

## 2024-06-05 DIAGNOSIS — N1832 Chronic kidney disease, stage 3b: Secondary | ICD-10-CM | POA: Diagnosis not present

## 2024-06-05 DIAGNOSIS — I480 Paroxysmal atrial fibrillation: Secondary | ICD-10-CM

## 2024-06-05 DIAGNOSIS — Z0000 Encounter for general adult medical examination without abnormal findings: Secondary | ICD-10-CM | POA: Diagnosis not present

## 2024-06-05 DIAGNOSIS — Z23 Encounter for immunization: Secondary | ICD-10-CM

## 2024-06-05 DIAGNOSIS — I10 Essential (primary) hypertension: Secondary | ICD-10-CM | POA: Diagnosis not present

## 2024-06-05 DIAGNOSIS — E66811 Obesity, class 1: Secondary | ICD-10-CM | POA: Diagnosis not present

## 2024-06-05 DIAGNOSIS — E1142 Type 2 diabetes mellitus with diabetic polyneuropathy: Secondary | ICD-10-CM | POA: Diagnosis not present

## 2024-06-05 DIAGNOSIS — N179 Acute kidney failure, unspecified: Secondary | ICD-10-CM

## 2024-06-05 DIAGNOSIS — Z7985 Long-term (current) use of injectable non-insulin antidiabetic drugs: Secondary | ICD-10-CM | POA: Diagnosis not present

## 2024-06-05 DIAGNOSIS — E782 Mixed hyperlipidemia: Secondary | ICD-10-CM

## 2024-06-05 MED ORDER — METOPROLOL TARTRATE 25 MG PO TABS: 25.0000 mg | ORAL_TABLET | Freq: Two times a day (BID) | ORAL | 3 refills | Status: AC

## 2024-06-05 MED ORDER — PRAVASTATIN SODIUM 20 MG PO TABS: 20.0000 mg | ORAL_TABLET | Freq: Every day | ORAL | 3 refills | Status: AC

## 2024-06-05 MED ORDER — VALSARTAN 320 MG PO TABS: 320.0000 mg | ORAL_TABLET | Freq: Every day | ORAL | 3 refills | Status: AC

## 2024-06-05 NOTE — Patient Instructions (Addendum)
 Thank you for coming to the office today.  Keep on Ozempic  weekly injection  Continue improving lifestyle diet as you are.  Sharyle will check in by phone  For Knee Pain We can keep doing cortisone injections every 3-6 months as needed.  If you want to try other topical medication START anti inflammatory topical - OTC Voltaren (generic Diclofenac) topical 2-4 times a day as needed for pain swelling of affected joint for 1-2 weeks or longer.  Future Shingles vaccine at the pharmacy 2 doses 2-6 months apart at the pharmacy  Prevnar-20 vaccine today  -------------------  Next time we can discuss Heart screening Future option Coronary Calcium Score Cardiac CT Scan. This is a screening test for patients aged 75-50+ with cardiovascular risk factors or who are healthy but would be interested in Cardiovascular Screening for heart disease. Even if there is a family history of heart disease, this imaging can be useful. Typically it can be done every 5+ years or at a different timeline we agree on  The scan will look at the chest and mainly focus on the heart and identify early signs of calcium build up or blockages within the heart arteries. It is not 100% accurate for identifying blockages or heart disease, but it is useful to help us  predict who may have some early changes or be at risk in the future for a heart attack or cardiovascular problem.  The results are reviewed by a Cardiologist and they will document the results. It should become available on MyChart. Typically the results are divided into percentiles based on other patients of the same demographic and age. So it will compare your risk to others similar to you. If you have a higher score >99 or higher percentile >75%tile, it is recommended to consider Statin cholesterol therapy and or referral to Cardiologist. I will try to help explain your results and if we have questions we can contact the Cardiologist.  You will be  contacted for scheduling. Usually it is done at any imaging facility through [facility], [facility] or [facility].  The cost is $99 flat fee total and it does not go through insurance, so no authorization is required.    Please schedule a Follow-up Appointment to: Return for knee inj March 2026 and DM A1c in 6 months.  If you have any other questions or concerns, please feel free to call the office or send a message through MyChart. You may also schedule an earlier appointment if necessary.  Additionally, you may be receiving a survey about your experience at our office within a few days to 1 week by e-mail or mail. We value your feedback.  Marsa Officer, DO [facility] [place]

## 2024-06-05 NOTE — Progress Notes (Signed)
 "  Subjective:    [name], male    DOB: 06/24/1955, 69 y.o.   MRN: [id number]  Richard Oconnell is a 69 y.o. male presenting on 06/05/2024 for Annual Exam   HPI  Discussed the use of AI scribe software for clinical note transcription with the patient, who gave verbal consent to proceed.  History of Present Illness   Richard Oconnell is a 69 year old male with type 2 diabetes and knee pain who presents for a routine follow-up and management of his chronic conditions.   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 - Frequently skips breakfast and prefers earlier dinners. - Consumes coffee with lactate-free milk. - Avoids high-sugar foods.        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 - Pain radiates to the ankle and groin at times. - Received cortisone injections in September and December of the previous year. - Concerned about long-term use of cortisone injections and exploring alternative pain management options. - Ibuprofen  provides effective pain relief but is contraindicated due to concurrent use of Eliquis . - Lidocaine  and gabapentin  have provided limited benefit. - Considering trial of Voltaren gel. 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 - Hemoglobin A1c is 7.9%. - Glycemic fluctuations attributed to dietary habits and stress related to working on a project - Current med: Ozempic  1mg  weekly inj   CKD-IIIb Last lab shows AKI with elevated BUN and Creatinine reduced eGFR compared to previous. Likely reduced  hydration  Paroxysmal Atrial Fibrillation Followed by cardiology On Anticoag Eliquis  and Metoprolol    HYPERLIPIDEMIA: - Reports no concerns, last lab mild elevated TG but normal LDL - Currently taking Pravastatin  20mg  + Fenofibrate , tolerating well without side effects or myalgias   Insomnia Has flares with this on occasion, request re order Zolpidem  5mg  was successful.   Health Maintenance:  Shingles vaccine 2 doses, 2-6 months apart. Noted past shingles flares before x 2  Offer Prevnar-20. Past Prevnar-13 2022.  Last Colonoscopy 07/03/20 - Dr Therisa     06/05/2024   10:30 AM 04/27/2024   10:22 AM 01/23/2024   10:58 AM  Depression screen PHQ 2/9  Decreased Interest 0 0 0  Down, Depressed, Hopeless 0 0 0  PHQ - 2 Score 0 0 0  Altered sleeping 0 0 0  Tired, decreased energy   0  Change in appetite 2 1 0  Feeling bad or failure about yourself  0 0 0  Trouble concentrating 0 0 1  Moving slowly or fidgety/restless 0 0 0  Suicidal thoughts 0 0 0  PHQ-9 Score 2 1 1    Difficult doing work/chores Not difficult at all Not difficult at all Not difficult at all     Data saved with a previous flowsheet row definition       06/05/2024   10:31 AM 04/27/2024   10:23 AM 01/23/2024   10:58 AM 11/24/2023   10:30 AM  GAD 7 : Generalized Anxiety Score  Nervous, Anxious, on Edge 0 0  0  0   Control/stop worrying 0 0  0  0   Worry too much - different things 0 0  0  0   Trouble relaxing 0 1  1  0   Restless 0 0  0  0   Easily annoyed or irritable 0 0  0  0   Afraid - awful might happen 0 0  0  0   Total GAD 7 Score 0 1 1 0  Anxiety Difficulty  Not difficult at all Not difficult at all Not difficult at all     Data saved with a previous flowsheet row definition     Past Medical History:  Diagnosis Date   Cancer [facility])    Bladder Cancer   Erectile dysfunction associated with type 2 diabetes mellitus (HCC) 05/26/2016   Gross hematuria 11/16/2016   Hypertension 06/11/2015   Type 2  diabetes mellitus with diabetic polyneuropathy, without long-term current use of insulin  (HCC) 06/11/2015   Past Surgical History:  Procedure Laterality Date   boil lanceted     hand   COLONOSCOPY WITH PROPOFOL  N/A 07/03/2020   Procedure: COLONOSCOPY WITH PROPOFOL ;  Surgeon: Therisa Bi, MD;  Location: [facility] ENDOSCOPY;  Service: Gastroenterology;  Laterality: N/A;   CYSTOSCOPY W/ RETROGRADES Bilateral 12/06/2016   Procedure: CYSTOSCOPY WITH RETROGRADE PYELOGRAM;  Surgeon: Penne Knee, MD;  Location: [facility] ORS;  Service: Urology;  Laterality: Bilateral;   CYSTOSCOPY W/ RETROGRADES Bilateral 06/27/2017   Procedure: CYSTOSCOPY WITH RETROGRADE PYELOGRAM;  Surgeon: Penne Knee, MD;  Location: [facility] ORS;  Service: Urology;  Laterality: Bilateral;   CYSTOSCOPY WITH BIOPSY N/A 06/27/2017   Procedure: CYSTOSCOPY WITH Bladder BIOPSY;  Surgeon: Penne Knee, MD;  Location: [facility] ORS;  Service: Urology;  Laterality: N/A;   CYSTOSCOPY WITH BIOPSY N/A 10/24/2017   Procedure: CYSTOSCOPY WITH Bladder BIOPSY;  Surgeon: Penne Knee, MD;  Location: [facility] ORS;  Service: Urology;  Laterality: N/A;   ingrown  Bilateral    ingrown toenail   IR BILIARY DRAIN PLACEMENT WITH CHOLANGIOGRAM  07/10/2020   IR CHOLANGIOGRAM EXISTING TUBE  08/07/2020   IR RADIOLOGIST EVAL & MGMT  08/07/2020   IR RADIOLOGIST EVAL & MGMT  10/29/2020   IR RADIOLOGIST EVAL & MGMT  11/12/2020   TRANSURETHRAL RESECTION OF BLADDER TUMOR N/A 12/06/2016   Procedure: TRANSURETHRAL RESECTION OF BLADDER TUMOR (TURBT) (2-5cm) CLOT EVACUATION;  Surgeon: Penne Knee, MD;  Location: [facility] ORS;  Service: Urology;  Laterality: N/A;   TRANSURETHRAL RESECTION OF BLADDER TUMOR N/A 06/27/2017   Procedure: TRANSURETHRAL RESECTION OF BLADDER TUMOR (TURBT);  Surgeon: Penne Knee, MD;  Location: [facility] ORS;  Service: Urology;  Laterality: N/A;   TRANSURETHRAL RESECTION OF BLADDER TUMOR WITH MITOMYCIN -C N/A 01/18/2017   Procedure: TRANSURETHRAL RESECTION OF  BLADDER TUMOR WITH MITOMYCIN -C-(SMALL);  Surgeon: Penne Knee, MD;  Location: [facility] ORS;  Service: Urology;  Laterality: N/A;   Social History   Socioeconomic History   Marital status: Divorced    Spouse name: Not on file   Number of children: Not on file   Years of education: Not on file   Highest education level: Not on file  Occupational History   Not on file  Tobacco Use   Smoking status: Former    Types: Cigars    Passive exposure: Past   Smokeless tobacco: Never   Tobacco comments:    once a while had one cigar >10 years ago  Vaping Use   Vaping status: Never Used  Substance and Sexual Activity   Alcohol use: No    Alcohol/week: 0.0 standard drinks of  alcohol   Drug use: No   Sexual activity: Never  Other Topics Concern   Not on file  Social History Narrative   Lives alone   Social Drivers of Health   Tobacco Use: Medium Risk (06/05/2024)   Patient History    Smoking Tobacco Use: Former    Smokeless Tobacco Use: Never    Passive Exposure: Past  Physicist, Medical Strain: Low Risk (10/27/2023)   Overall Financial Resource Strain (CARDIA)    Difficulty of Paying Living Expenses: Not hard at all  Food Insecurity: No Food Insecurity (10/27/2023)   Epic    Worried About Programme Researcher, Broadcasting/film/video in the Last Year: Never true    Ran Out of Food in the Last Year: Never true  Transportation Needs: No Transportation Needs (10/27/2023)   Epic    Lack of Transportation (Medical): No    Lack of Transportation (Non-Medical): No  Physical Activity: Inactive (10/27/2023)   Exercise Vital Sign    Days of Exercise per Week: 0 days    Minutes of Exercise per Session: 0 min  Stress: No Stress Concern Present (10/27/2023)   [employer] of Occupational Health - Occupational Stress Questionnaire    Feeling of Stress: Not at all  Social Connections: Socially Isolated (10/27/2023)   Social Connection and Isolation Panel    Frequency of Communication with Friends and Family: More  than three times a week    Frequency of Social Gatherings with Friends and Family: More than three times a week    Attends Religious Services: Never    Database Administrator or Organizations: No    Attends Banker Meetings: Never    Marital Status: Divorced  Catering Manager Violence: Not At Risk (10/27/2023)   Epic    Fear of Current or Ex-Partner: No    Emotionally Abused: No    Physically Abused: No    Sexually Abused: No  Depression (PHQ2-9): Low Risk (06/05/2024)   Depression (PHQ2-9)    PHQ-2 Score: 2  Alcohol Screen: Low Risk (10/27/2023)   Alcohol Screen    Last Alcohol Screening Score (AUDIT): 0  Housing: Unknown (10/27/2023)   Epic    Unable to Pay for Housing in the Last Year: No    Number of Times Moved in the Last Year: Not on file    Homeless in the Last Year: No  Utilities: Not At Risk (10/27/2023)   Epic    Threatened with loss of utilities: No  Health Literacy: Adequate Health Literacy (10/27/2023)   B1300 Health Literacy    Frequency of need for help with medical instructions: Never   Family History  Problem Relation Age of Onset   Heart attack Mother    Heart disease Mother    Parkinson's disease Father    Heart attack Maternal Grandfather    Heart disease Maternal Grandfather    Heart attack Maternal Aunt    Heart attack Maternal Grandmother    Prostate cancer Neg Hx    Bladder Cancer Neg Hx    Kidney cancer Neg Hx    Current Outpatient Medications on File Prior to Visit  Medication Sig   apixaban  (ELIQUIS ) 5 MG TABS tablet Take 1 tablet (5 mg total) by mouth 2 (two) times daily.   fenofibrate  (TRICOR ) 145 MG tablet Take 1 tablet (145 mg total) by mouth daily.   gabapentin  (NEURONTIN ) 300 MG capsule Take 1 capsule (300 mg total) by mouth 3 (three) times daily. (Patient taking differently: Take 300 mg  by mouth 3 (three) times daily as needed.)   Semaglutide , 1 MG/DOSE, 4 MG/3ML SOPN Inject 1 mg as directed once a week.   sildenafil  (REVATIO )  20 MG tablet Take 1-5 pills about 30 min prior to sex. Start with 1 and increase as needed.   zolpidem  (AMBIEN ) 5 MG tablet Take 1 tablet (5 mg total) by mouth at bedtime as needed for sleep.   No current facility-administered medications on file prior to visit.    Review of Systems  Constitutional:  Negative for activity change, appetite change, chills, diaphoresis, fatigue and fever.  HENT:  Negative for congestion and hearing loss.   Eyes:  Negative for visual disturbance.  Respiratory:  Negative for cough, chest tightness, shortness of breath and wheezing.   Cardiovascular:  Negative for chest pain, palpitations and leg swelling.  Gastrointestinal:  Negative for abdominal pain, constipation, diarrhea, nausea and vomiting.  Genitourinary:  Negative for dysuria, frequency and hematuria.  Musculoskeletal:  Negative for arthralgias and neck pain.  Skin:  Negative for rash.  Neurological:  Negative for dizziness, weakness, light-headedness, numbness and headaches.  Hematological:  Negative for adenopathy.  Psychiatric/Behavioral:  Negative for behavioral problems, dysphoric mood and sleep disturbance.    Per HPI unless specifically indicated above     Objective:    BP 130/70 (BP Location: Left Arm, Patient Position: Sitting, Cuff Size: Large)   Pulse 67   Ht 6' 4 (1.93 m)   Wt 271 lb 8 oz (123.2 kg)   SpO2 94%   BMI 33.05 kg/m   Wt Readings from Last 3 Encounters:  06/05/24 271 lb 8 oz (123.2 kg)  04/27/24 274 lb 2 oz (124.3 kg)  03/16/24 267 lb 6 oz (121.3 kg)    Physical Exam Vitals and nursing note reviewed.  Constitutional:      General: He is not in acute distress.    Appearance: He is well-developed. He is obese. He is not diaphoretic.     Comments: Well-appearing, comfortable, cooperative  HENT:     Head: Normocephalic and atraumatic.  Eyes:     General:        Right eye: No discharge.        Left eye: No discharge.     Conjunctiva/sclera: Conjunctivae normal.      Pupils: Pupils are equal, round, and reactive to light.  Neck:     Thyroid : No thyromegaly.  Cardiovascular:     Rate and Rhythm: Normal rate and regular rhythm.     Pulses: Normal pulses.     Heart sounds: Normal heart sounds. No murmur heard. Pulmonary:     Effort: Pulmonary effort is normal. No respiratory distress.     Breath sounds: Normal breath sounds. No wheezing or rales.  Abdominal:     General: Bowel sounds are normal. There is no distension.     Palpations: Abdomen is soft. There is no mass.     Tenderness: There is no abdominal tenderness.  Musculoskeletal:        General: No tenderness. Normal range of motion.     Cervical back: Normal range of motion and neck supple.     Right lower leg: No edema.     Left lower leg: No edema.     Comments: Upper / Lower Extremities: - Normal muscle tone, strength bilateral upper extremities 5/5, lower extremities 5/5  Lymphadenopathy:     Cervical: No cervical adenopathy.  Skin:    General: Skin is warm and dry.  Findings: No erythema or rash.  Neurological:     Mental Status: He is alert and oriented to person, place, and time.     Comments: Distal sensation intact to light touch all extremities  Psychiatric:        Mood and Affect: Mood normal.        Behavior: Behavior normal.        Thought Content: Thought content normal.     Comments: Well groomed, good eye contact, normal speech and thoughts     I have personally reviewed the radiology report from 11/24/23 on X-ray R Knee.  CLINICAL DATA:  Chronic right knee pain.   EXAM: RIGHT KNEE - COMPLETE 4+ VIEW   COMPARISON:  None Available.   FINDINGS: Mild lateral patellofemoral joint space narrowing. Mild tricompartmental peripheral spurring. There is also a central spur in the medial femoral condyle. No fracture. No erosions or focal bone abnormality. No joint effusion.   IMPRESSION: Mild tricompartmental osteoarthritis.     Electronically Signed   By:  Andrea Gasman M.D.   On: 11/26/2023 19:14  Results for orders placed or performed in visit on 05/29/24  Comprehensive metabolic panel with GFR   Collection Time: 05/29/24  8:20 AM  Result Value Ref Range   Glucose, Bld 138 (H) 65 - 99 mg/dL   BUN 38 (H) 7 - 25 mg/dL   Creat 8.08 (H) 9.29 - 1.35 mg/dL   eGFR 37 (L) > OR = 60 mL/min/1.51m2   BUN/Creatinine Ratio 20 6 - 22 (calc)   Sodium 139 135 - 146 mmol/L   Potassium 5.3 3.5 - 5.3 mmol/L   Chloride 105 98 - 110 mmol/L   CO2 26 20 - 32 mmol/L   Calcium 9.7 8.6 - 10.3 mg/dL   Total Protein 7.6 6.1 - 8.1 g/dL   Albumin  4.4 3.6 - 5.1 g/dL   Globulin 3.2 1.9 - 3.7 g/dL (calc)   AG Ratio 1.4 1.0 - 2.5 (calc)   Total Bilirubin 0.4 0.2 - 1.2 mg/dL   Alkaline phosphatase (APISO) 71 35 - 144 U/L   AST 32 10 - 35 U/L   ALT 30 9 - 46 U/L  TSH   Collection Time: 05/29/24  8:20 AM  Result Value Ref Range   TSH 1.06 0.40 - 4.50 mIU/L  PSA   Collection Time: 05/29/24  8:20 AM  Result Value Ref Range   PSA 0.38 < OR = 4.00 ng/mL  CBC with Differential/Platelet   Collection Time: 05/29/24  8:20 AM  Result Value Ref Range   WBC 7.5 3.8 - 10.8 Thousand/uL   RBC 5.47 4.20 - 5.80 Million/uL   Hemoglobin 16.9 13.2 - 17.1 g/dL   HCT 49.3 60.5 - 48.8 %   MCV 92.5 81.4 - 101.7 fL   MCH 30.9 27.0 - 33.0 pg   MCHC 33.4 31.6 - 35.4 g/dL   RDW 87.8 88.9 - 84.9 %   Platelets 311 140 - 400 Thousand/uL   MPV 10.7 7.5 - 12.5 fL   Neutro Abs 3,323 1,500 - 7,800 cells/uL   Absolute Lymphocytes 3,128 850 - 3,900 cells/uL   Absolute Monocytes 683 200 - 950 cells/uL   Eosinophils Absolute 300 15 - 500 cells/uL   Basophils Absolute 68 0 - 200 cells/uL   Neutrophils Relative % 44.3 %   Total Lymphocyte 41.7 %   Monocytes Relative 9.1 %   Eosinophils Relative 4.0 %   Basophils Relative 0.9 %  Hemoglobin A1c   Collection Time:  05/29/24  8:20 AM  Result Value Ref Range   Hgb A1c MFr Bld 7.9 (H) <5.7 %   Mean Plasma Glucose 180 mg/dL   eAG  (mmol/L) 89.9 mmol/L  Lipid panel   Collection Time: 05/29/24  8:20 AM  Result Value Ref Range   Cholesterol 138 <200 mg/dL   HDL 36 (L) > OR = 40 mg/dL   Triglycerides 758 (H) <150 mg/dL   LDL Cholesterol (Calc) 69 mg/dL (calc)   Total CHOL/HDL Ratio 3.8 <5.0 (calc)   Non-HDL Cholesterol (Calc) 102 <130 mg/dL (calc)  Microalbumin / creatinine urine ratio   Collection Time: 05/29/24  9:24 AM  Result Value Ref Range   Creatinine, Urine 113 20 - 320 mg/dL   Microalb, Ur 0.4 mg/dL   Microalb Creat Ratio 4 <30 mg/g creat      Assessment & Plan:   Problem List Items Addressed This Visit     Atrial fibrillation (HCC)   Relevant Medications   valsartan  (DIOVAN ) 320 MG tablet   pravastatin  (PRAVACHOL ) 20 MG tablet   metoprolol  tartrate (LOPRESSOR ) 25 MG tablet   Benign essential hypertension   Relevant Medications   valsartan  (DIOVAN ) 320 MG tablet   pravastatin  (PRAVACHOL ) 20 MG tablet   metoprolol  tartrate (LOPRESSOR ) 25 MG tablet   Mixed hyperlipidemia   Relevant Medications   valsartan  (DIOVAN ) 320 MG tablet   pravastatin  (PRAVACHOL ) 20 MG tablet   metoprolol  tartrate (LOPRESSOR ) 25 MG tablet   Obesity (BMI 30.0-34.9)   Type 2 diabetes mellitus with diabetic polyneuropathy, without long-term current use of insulin  (HCC)   Relevant Medications   valsartan  (DIOVAN ) 320 MG tablet   pravastatin  (PRAVACHOL ) 20 MG tablet   Other Visit Diagnoses       Annual physical exam    -  Primary     Need for Streptococcus pneumoniae vaccination       Relevant Orders   Pneumococcal conjugate vaccine 20-valent (Completed)     Long-term current use of injectable noninsulin antidiabetic medication         Essential hypertension       Relevant Medications   valsartan  (DIOVAN ) 320 MG tablet   pravastatin  (PRAVACHOL ) 20 MG tablet   metoprolol  tartrate (LOPRESSOR ) 25 MG tablet     Stage 3b chronic kidney disease (HCC)         AKI (acute kidney injury)            Updated Health  Maintenance information Reviewed recent lab results with patient Encouraged improvement to lifestyle with diet and exercise Goal of weight loss   Pneumococcal vaccination Eligible for Prevnar 20 booster, previously received in 2022. Discussed timing and benefits of receiving the booster now or next year.  Type 2 diabetes mellitus with diabetic polyneuropathy and Nephropathy CKD A1c is 7.9, slightly elevated.  Discussed lifestyle modifications and medication adherence. Blood glucose levels generally well-controlled with occasional spikes. - Continue Ozempic . 1mg  weekly  - Monitor blood glucose levels. - Scheduled follow-up for A1c in July or August.  Benign essential hypertension Blood pressure consistently high during office visits but well-controlled at home. Discussed monitoring and lifestyle modifications. - Continue current antihypertensive regimen. Metoprolol  25 TWICE A DAY, Valsartan  320mg  daily  AKI in setting of CKD Stage 3b Mild to moderate reduction in kidney function. With notable elevated BUN suggesting mild dehydration prior to labs. Discussed importance of hydration and blood sugar control. - Increase hydration. - Monitor kidney function.  Mixed hyperlipidemia Cholesterol levels well-controlled, LDL at 69. Triglycerides  elevated but not concerning. Discussed relationship between triglycerides and blood sugar levels. - Continue current lipid management. Fenofibrate  145mg  daily and Pravastatin  20mg  daily  Paroxysmal atrial fibrillation Managed with Eliquis . Discussed medication adherence and potential cardiac testing. - Continue Eliquis  5mg  bid - Consider CT scan of the chest for cardiac evaluation. Calcium score  Knee osteoarthritis with bone spurs Intermittent knee pain exacerbated by prolonged standing or driving. Discussed frequency of injections and potential future interventions. - Schedule next cortisone injection for March or April. - Recommended Voltaren gel  for topical pain relief.       Orders Placed This Encounter  Procedures   Pneumococcal conjugate vaccine 20-valent    Meds ordered this encounter  Medications   valsartan  (DIOVAN ) 320 MG tablet    Sig: Take 1 tablet (320 mg total) by mouth daily.    Dispense:  90 tablet    Refill:  3   pravastatin  (PRAVACHOL ) 20 MG tablet    Sig: Take 1 tablet (20 mg total) by mouth daily.    Dispense:  90 tablet    Refill:  3   metoprolol  tartrate (LOPRESSOR ) 25 MG tablet    Sig: Take 1 tablet (25 mg total) by mouth 2 (two) times daily.    Dispense:  180 tablet    Refill:  3     Follow up plan: Return for knee inj March 2026 and DM A1c in 6 months.  Marsa Officer, DO [facility] 06/05/2024, 9:38 AM  "

## 2024-06-06 ENCOUNTER — Other Ambulatory Visit: Payer: Self-pay | Admitting: Family Medicine

## 2024-06-06 DIAGNOSIS — G8929 Other chronic pain: Secondary | ICD-10-CM

## 2024-06-06 NOTE — Telephone Encounter (Signed)
 Requested Prescriptions  Pending Prescriptions Disp Refills   gabapentin  (NEURONTIN ) 300 MG capsule [Pharmacy Med Name: GABAPENTIN  300 MG CAPSULE] 90 capsule 0    Sig: Take 1 capsule (300 mg total) by mouth 3 (three) times daily.     Neurology: Anticonvulsants - gabapentin  Failed - 06/06/2024  2:43 PM      Failed - Cr in normal range and within 360 days    Creat  Date Value Ref Range Status  05/29/2024 1.91 (H) 0.70 - 1.35 mg/dL Final   Creatinine, Urine  Date Value Ref Range Status  05/29/2024 113 20 - 320 mg/dL Final         Passed - Completed PHQ-2 or PHQ-9 in the last 360 days      Passed - Valid encounter within last 12 months    Recent Outpatient Visits           Yesterday Annual physical exam   [facility] [place] Marsa PARAS, DO   1 month ago Chronic pain of right knee   [facility] [place], Marsa PARAS, DO   4 months ago Chronic pain of right knee   [facility] [place] [facility] [place], Marsa PARAS, DO   6 months ago Type 2 diabetes mellitus with diabetic polyneuropathy, without long-term current use of insulin  [facility])   [place] [place], Marsa PARAS, [place]

## 2024-06-19 ENCOUNTER — Other Ambulatory Visit: Payer: Self-pay

## 2024-06-20 ENCOUNTER — Other Ambulatory Visit: Payer: Self-pay

## 2024-08-09 ENCOUNTER — Ambulatory Visit: Admitting: Family Medicine

## 2024-09-03 ENCOUNTER — Other Ambulatory Visit

## 2024-11-02 ENCOUNTER — Ambulatory Visit

## 2024-11-07 ENCOUNTER — Ambulatory Visit

## 2024-12-03 ENCOUNTER — Ambulatory Visit: Admitting: Family Medicine
